# Patient Record
Sex: Female | Born: 1965 | Race: White | Hispanic: No | Smoking: Never smoker
Health system: Southern US, Academic
[De-identification: ages and names within clinical notes are randomized; demographics above are authoritative.]

## PROBLEM LIST (undated history)

## (undated) DIAGNOSIS — S82302A Unspecified fracture of lower end of left tibia, initial encounter for closed fracture: Secondary | ICD-10-CM

## (undated) DIAGNOSIS — N201 Calculus of ureter: Secondary | ICD-10-CM

## (undated) DIAGNOSIS — Z9981 Dependence on supplemental oxygen: Secondary | ICD-10-CM

## (undated) DIAGNOSIS — N200 Calculus of kidney: Secondary | ICD-10-CM

## (undated) DIAGNOSIS — I272 Pulmonary hypertension, unspecified: Secondary | ICD-10-CM

## (undated) DIAGNOSIS — Z8669 Personal history of other diseases of the nervous system and sense organs: Secondary | ICD-10-CM

## (undated) DIAGNOSIS — D6851 Activated protein C resistance: Secondary | ICD-10-CM

## (undated) DIAGNOSIS — I2699 Other pulmonary embolism without acute cor pulmonale: Secondary | ICD-10-CM

## (undated) DIAGNOSIS — F32A Depression, unspecified: Secondary | ICD-10-CM

## (undated) DIAGNOSIS — F329 Major depressive disorder, single episode, unspecified: Secondary | ICD-10-CM

## (undated) DIAGNOSIS — D682 Hereditary deficiency of other clotting factors: Secondary | ICD-10-CM

## (undated) DIAGNOSIS — J9611 Chronic respiratory failure with hypoxia: Secondary | ICD-10-CM

## (undated) HISTORY — PX: ORIF TIBIAL SHAFT FRACTURE W/ PLATES AND SCREWS: SUR964

## (undated) HISTORY — PX: HX TUBAL LIGATION: SHX77

## (undated) MED ORDER — CIPROFLOXACIN 500 MG TAB
500 mg | ORAL_TABLET | Freq: Two times a day (BID) | ORAL | Status: AC
Start: ? — End: 2011-11-29

## (undated) MED ORDER — ONDANSETRON 8 MG TAB, RAPID DISSOLVE
8 mg | ORAL_TABLET | Freq: Three times a day (TID) | ORAL | Status: DC | PRN
Start: ? — End: 2007-06-29

---
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
## 2001-06-06 ENCOUNTER — Emergency Department (HOSPITAL_COMMUNITY): Payer: Self-pay | Admitting: Emergency Medicine

## 2001-07-03 ENCOUNTER — Emergency Department (HOSPITAL_COMMUNITY): Payer: Self-pay | Admitting: Emergency Medicine

## 2001-07-11 ENCOUNTER — Emergency Department (HOSPITAL_COMMUNITY): Payer: Self-pay | Admitting: Emergency Medicine

## 2001-07-11 ENCOUNTER — Other Ambulatory Visit: Payer: Self-pay | Admitting: Emergency Medicine

## 2001-10-13 ENCOUNTER — Other Ambulatory Visit: Payer: Self-pay

## 2001-10-13 ENCOUNTER — Inpatient Hospital Stay (HOSPITAL_COMMUNITY): Payer: Self-pay | Admitting: Internal Medicine

## 2001-11-30 ENCOUNTER — Emergency Department (HOSPITAL_COMMUNITY): Payer: Self-pay

## 2001-12-01 ENCOUNTER — Inpatient Hospital Stay (HOSPITAL_COMMUNITY): Payer: Self-pay | Admitting: Internal Medicine

## 2002-07-24 ENCOUNTER — Inpatient Hospital Stay (HOSPITAL_COMMUNITY): Payer: Self-pay | Admitting: Internal Medicine

## 2004-02-11 HISTORY — PX: VENA CAVA FILTER PLACEMENT: SUR1032

## 2006-06-10 LAB — URINE MICROSCOPIC
Bacteria: NEGATIVE /HPF
Casts: 0 /LPF
Crystals, urine: 0 /LPF
Mucus: 0 /LPF

## 2006-07-29 LAB — URINE MICROSCOPIC
Bacteria: NEGATIVE /HPF
Casts: 0 /LPF
Crystals, urine: 0 /LPF
Mucus: 0 /LPF
RBC: >100 /HPF

## 2006-07-29 LAB — HCG URINE, QL. - POC: Pregnancy test,urine (POC): NEGATIVE

## 2006-07-29 MED ORDER — PROMETHAZINE 25 MG/ML INJECTION
25 mg/mL | Freq: Four times a day (QID) | INTRAMUSCULAR | Status: DC | PRN
Start: 2006-07-29 — End: 2006-07-29

## 2006-07-29 MED FILL — DEMEROL (PF) 50 MG/ML INJECTION SYRINGE: 50 mg/mL | INTRAMUSCULAR | Qty: 1

## 2006-07-29 NOTE — ED Provider Notes (Signed)
Flank Pain   [name] This is a new problem. The current episode started 3 - 5 hours ago. The problem has been unchanged since onset. The problem has been occurring constantly. Patient reports no work related injury.The pain is present in the right side. The quality of the pain is cramping. The pain is at a severity of 7/10. The pain is severe. Associated symptoms include abdominal pain.        Review of Systems   Constitutional: Negative.    Gastrointestinal: Positive for nausea, vomiting and abdominal pain.   Genitourinary: Positive for flank pain.   Musculoskeletal: Positive for back pain.   All other systems reviewed and are negative.      Physical Exam   Constitutional: She appears distressed.   HENT:   Head: Normocephalic.   Eyes: Pupils are equal, round, and reactive to light.   Pulmonary/Chest: Effort normal and breath sounds normal.   Abdominal: Soft.   Neurological: She is alert.   Skin: Skin is warm and dry.       Codeine, Pcn, Prednisone, Ketorolac tromethamine, Shellfish and Iv dye, iodine containing    Results for orders placed during the hospital encounter of 06/10/2006   URINE MICROSCOPIC   Component Value Range   ??? WBC, URINE 0-3  - (/HPF)   ??? RBC, URINE 0-3  - (/HPF)   ??? EPITHELIAL 5-10  - (/HPF)   ??? BACTERIA NEGATIVE   - (/HPF)   ??? CASTS 0  - (/LPF)   ??? CRYSTALS 0  - (/LPF)   ??? MUCUS 0  - (/LPF)         ED Plan:    As ordered.

## 2006-07-29 NOTE — ED Notes (Signed)
CT urogram neg. This ia a frequent ED visitor with chr. pain issues.

## 2006-07-31 LAB — URINE MICROSCOPIC
Casts: 0 /LPF
Crystals, urine: 0 /LPF
Mucus: 0 /LPF

## 2006-07-31 LAB — METABOLIC PANEL, BASIC
Anion gap: 8 (ref 7–16)
BUN: 9 MG/DL (ref 7–18)
CO2: 27 MMOL/L (ref 21–32)
Calcium: 8.6 MG/DL (ref 8.4–10.4)
Chloride: 107 MMOL/L (ref 98–107)
Creatinine: 0.8 MG/DL (ref 0.6–1.0)
GFR est AA: >60 mL/min/{1.73_m2} (ref >60)
GFR est non-AA: >60 mL/min/{1.73_m2} (ref >60)
Glucose: 89 MG/DL (ref 74–106)
Potassium: 4.2 MMOL/L (ref 3.5–5.1)
Sodium: 142 MMOL/L (ref 136–145)

## 2006-07-31 LAB — CBC WITH AUTOMATED DIFF
ABS. BASOPHILS: 0 10*3/uL (ref 0.0–0.2)
ABS. EOSINOPHILS: 0.3 10*3/uL (ref 0.00–0.80)
ABS. LYMPHOCYTES: 1.4 10*3/uL (ref 0.6–4.3)
ABS. MONOCYTES: 0.4 10*3/uL (ref 0.1–0.9)
ABS. NEUTROPHILS: 2.6 10*3/uL (ref 1.9–7.8)
BASOPHILS: 1 % (ref 0.1–1.6)
EOSINOPHILS: 6 % (ref 0.5–7.8)
HCT: 32.3 % — ABNORMAL LOW (ref 35.6–45.0)
HGB: 11 g/dL — ABNORMAL LOW (ref 11.7–15.0)
LYMPHOCYTES: 30 % (ref 14.7–41.3)
MCH: 30 PG (ref 26.1–32.9)
MCHC: 33.9 g/dL (ref 31.4–35.0)
MCV: 88.3 FL (ref 79.6–97.8)
MONOCYTES: 8 % (ref 3.2–9.0)
MPV: 7.5 FL — ABNORMAL LOW (ref 9.3–12.9)
NEUTROPHILS: 55 % (ref 47.0–74.6)
PLATELET: 242 10*3/uL (ref 140–440)
RBC: 3.66 M/uL — ABNORMAL LOW (ref 3.86–5.18)
RDW: 17 % — ABNORMAL HIGH (ref 11.9–14.6)
WBC: 4.8 10*3/uL (ref 4.5–10.5)

## 2006-07-31 LAB — DRUG SCREEN, URINE
ACETAMINOPHEN: POSITIVE
AMPHETAMINES: NEGATIVE
BARBITURATES: POSITIVE
BENZODIAZEPINES: NEGATIVE
COCAINE: NEGATIVE
METHADONE: NEGATIVE
Methamphetamines: NEGATIVE
OPIATES: NEGATIVE
PCP(PHENCYCLIDINE): NEGATIVE
THC (TH-CANNABINOL): NEGATIVE
TRICYCLICS: POSITIVE

## 2006-07-31 LAB — PROTHROMBIN TIME + INR
INR: >6.8 — CR (ref 0.9–1.2)
Prothrombin time: >63 SECS — ABNORMAL HIGH (ref 9.5–12.0)

## 2006-07-31 MED ORDER — PROMETHAZINE 25 MG/ML INJECTION
25 mg/mL | INTRAMUSCULAR | Status: DC
Start: 2006-07-31 — End: 2006-07-31

## 2006-07-31 MED ORDER — CEFTRIAXONE 1 GRAM SOLUTION FOR INJECTION
1 gram | Freq: Once | INTRAMUSCULAR | Status: DC
Start: 2006-07-31 — End: 2006-07-31

## 2006-07-31 MED ORDER — NALBUPHINE 10 MG/ML INJECTION
10 mg/mL | INTRAMUSCULAR | Status: DC
Start: 2006-07-31 — End: 2006-07-31

## 2006-07-31 NOTE — ED Provider Notes (Signed)
Kidney Stone  [name] This is a recurrent problem. The current episode started 2 days ago. The problem has been occurring constantly. The problem has been unchanged since onset. Associated symptoms include abdominal pain. The symptoms are worsened by nothing. The symptoms are relieved by nothing.        Review of Systems   Constitutional: Negative for fever and chills.   Gastrointestinal: Positive for abdominal pain.   Genitourinary: Positive for flank pain.   Musculoskeletal: Positive for back pain.   All other systems reviewed and are negative.      Physical Exam   Constitutional: She is oriented. She appears distressed.   HENT:   Head: Normocephalic and atraumatic.   Right Ear: External ear normal.   Left Ear: External ear normal.   Mouth/Throat: Oropharynx is clear and moist.   Eyes: Conjunctivae are normal. Pupils are equal, round, and reactive to light.   Neck: Normal range of motion. Neck supple.   Cardiovascular: Normal rate, regular rhythm and normal heart sounds.    Abdominal: Bowel sounds are normal. Soft.   Musculoskeletal: Normal range of motion.        Lumbar back: She exhibits tenderness and pain.   Neurological: She is alert and oriented. Gait normal.   Skin: Skin is warm and dry.       Codeine, Pcn, Prednisone, Ketorolac tromethamine, Shellfish and Iv dye, iodine containing    History   Social History   ??? Marital Status: Legally Separated     Spouse Name: N/A     Number of Children: N/A   ??? Years of Education: N/A   Occupational History   ??? Not on file.   Social History Main Topics   ??? Tobacco Use: Never   ??? Alcohol Use: No   ??? Drug Use: No   ??? Sexually Active:    Other Topics Concern   ??? Not on file   Social History Narrative   ??? No narrative on file         ED Plan:    W/u for recurrent stone

## 2006-07-31 NOTE — ED Notes (Signed)
Pt to ct scan per w/c [id number]

## 2006-08-13 MED FILL — DEMEROL (PF) 50 MG/ML INJECTION SYRINGE: 50 mg/mL | INTRAMUSCULAR | Qty: 1

## 2006-08-25 MED ORDER — HYDROCODONE-ACETAMINOPHEN 5 MG-500 MG TAB
5-500 mg | ORAL | Status: DC
Start: 2006-08-25 — End: 2006-08-25

## 2006-08-25 MED ORDER — HYDROCODONE-ACETAMINOPHEN 10 MG-500 MG TAB
10-500 mg | ORAL | Status: AC
Start: 2006-08-25 — End: 2006-08-25
  Administered 2006-08-25: 19:00:00 via ORAL

## 2006-08-25 MED FILL — HYDROCODONE-ACETAMINOPHEN 10 MG-500 MG TAB: 10-500 mg | ORAL | Qty: 1

## 2006-08-25 NOTE — ED Notes (Signed)
Pt discharged ambulatory to home, sitz bath provided. Pt teaching rt discharge and followup instructions. Pt voiced undersgtanding

## 2006-08-25 NOTE — ED Provider Notes (Signed)
Vaginal Pain   Primary symptoms include dyspareunia and genital pain. Primary symptoms include no discharge, no pelvic pain, no genital itching and no dysuria. [name] There has been no fever. This is a new problem. The current episode started more than 2 days ago. The problem has been occurring constantly. The problem has been unchanged since onset. The symptoms occur during intercourse and during urination. She is not pregnant. She has not missed her period. The discharge was normal and grey. Pertinent negatives include no abdominal pain, no nausea and no vomiting. She has tried acetaminophen for the symptoms. The treatment(s) provided no relief. Sexual activity: sexually active. There is a concern regarding sexually transmitted diseases.        Review of Systems   Constitutional: Negative.    Skin:        Painful area around vagina   Gastrointestinal: Negative for nausea, vomiting and abdominal pain.   Genitourinary: Negative for dysuria.       Physical Exam   Constitutional: She is oriented. She appears well-developed and well-nourished.   Cardiovascular: Normal rate and normal heart sounds.    Pulmonary/Chest: Effort normal and breath sounds normal.   Abdominal: Bowel sounds are normal. Soft.   Neurological: She is alert and oriented.   Skin: Skin is warm and dry.       Codeine, Pcn, Prednisone, Ketorolac tromethamine, Shellfish and Iv dye, iodine containing    History   Social History   ??? Marital Status: Legally Separated     Spouse Name: N/A     Number of Children: N/A   ??? Years of Education: N/A   Occupational History   ??? Not on file.   Social History Main Topics   ??? Tobacco Use: Never   ??? Alcohol Use: No   ??? Drug Use: No   ??? Sexually Active: Yes -- Female partner(s)   Other Topics Concern   ??? Not on file   Social History Narrative   ??? No narrative on file

## 2006-08-25 NOTE — ED Notes (Signed)
Pt moved to room 10 for pelvic exam

## 2006-08-25 NOTE — ED Notes (Signed)
UA HCG negative    NP to room for pelvic exam

## 2006-08-25 NOTE — ED Notes (Signed)
Confirmed dosage  For Lortab with T Fasalino NP to be 10 mg po

## 2006-08-25 NOTE — ED Provider Notes (Signed)
Vaginal Pain         ROS    Physical Exam   Genitourinary: Rectum normal and vagina normal. Pelvic exam was performed with patient supine.        Area between vaginal and rectal area with small linear, open area. No drainage but marked tenderness.        Codeine, Pcn, Prednisone, Ketorolac tromethamine, Shellfish and Iv dye, iodine containing    History   Social History   ??? Marital Status: Legally Separated     Spouse Name: N/A     Number of Children: N/A   ??? Years of Education: N/A   Occupational History   ??? Not on file.   Social History Main Topics   ??? Tobacco Use: Never   ??? Alcohol Use: No   ??? Drug Use: No   ??? Sexually Active: Yes -- Female partner(s)   Other Topics Concern   ??? Not on file   Social History Narrative   ??? No narrative on file

## 2006-08-25 NOTE — ED Notes (Signed)
PA to bedsidde

## 2006-09-05 NOTE — ED Notes (Signed)
Pt w/ c/o RUQ sharp abd pain w/ N/V unable to keep anything down.

## 2006-09-05 NOTE — ED Notes (Signed)
Pt to u/s

## 2006-09-06 LAB — URINE MICROSCOPIC
Casts: 0 /LPF
Crystals, urine: 0 /LPF
Mucus: 0 /LPF

## 2006-09-06 LAB — CBC WITH AUTOMATED DIFF
ABS. BASOPHILS: 0 10*3/uL (ref 0.0–0.2)
ABS. EOSINOPHILS: 0.1 10*3/uL (ref 0.00–0.80)
ABS. LYMPHOCYTES: 1.4 10*3/uL (ref 0.6–4.3)
ABS. MONOCYTES: 0.4 10*3/uL (ref 0.1–0.9)
ABS. NEUTROPHILS: 3.5 10*3/uL (ref 1.9–7.8)
BASOPHILS: 1 % (ref 0.1–1.6)
EOSINOPHILS: 2 % (ref 0.5–7.8)
HCT: 31.8 % — ABNORMAL LOW (ref 35.6–45.0)
HGB: 10.8 g/dL — ABNORMAL LOW (ref 11.7–15.0)
LYMPHOCYTES: 26 % (ref 14.7–41.3)
MCH: 31.3 PG (ref 26.1–32.9)
MCHC: 34.1 g/dL (ref 31.4–35.0)
MCV: 91.7 FL (ref 79.6–97.8)
MONOCYTES: 7 % (ref 3.2–9.0)
MPV: 7.4 FL (ref 7.4–10.4)
NEUTROPHILS: 64 % (ref 47.0–74.6)
PLATELET: 291 10*3/uL (ref 140–440)
RBC: 3.47 M/uL — ABNORMAL LOW (ref 3.86–5.18)
RDW: 15.3 % — ABNORMAL HIGH (ref 11.9–14.6)
WBC: 5.3 10*3/uL (ref 4.5–10.5)

## 2006-09-06 LAB — METABOLIC PANEL, COMPREHENSIVE
A-G Ratio: 1.1 — ABNORMAL LOW (ref 1.2–3.5)
ALT (SGPT): 33 U/L (ref 27–62)
AST (SGOT): 17 U/L (ref 15–37)
Albumin: 3.9 GM/DL (ref 3.5–5.0)
Alk. phosphatase: 118 U/L (ref 50–136)
Anion gap: 11 (ref 7–16)
BUN: 9 MG/DL (ref 7–18)
Bilirubin, total: 0.4 MG/DL (ref 0.2–1.1)
CO2: 26 MMOL/L (ref 21–32)
Calcium: 9.1 MG/DL (ref 8.4–10.4)
Chloride: 107 MMOL/L (ref 98–107)
Creatinine: 0.7 MG/DL (ref 0.6–1.0)
GFR est AA: >60 mL/min/{1.73_m2} (ref >60)
GFR est non-AA: >60 mL/min/{1.73_m2} (ref >60)
Globulin: 3.4 GM/DL (ref 2.3–3.5)
Glucose: 87 MG/DL (ref 74–106)
Potassium: 3.5 MMOL/L (ref 3.5–5.1)
Protein, total: 7.3 GM/DL (ref 6.3–8.2)
Sodium: 144 MMOL/L (ref 136–145)

## 2006-09-06 LAB — PROTHROMBIN TIME + INR
INR: 1.1 (ref 0.9–1.2)
Prothrombin time: 11.2 SECS (ref 9.5–12.0)

## 2006-09-06 LAB — HCG URINE, QL: HCG urine, QL: NEGATIVE

## 2006-09-06 LAB — LIPASE: Lipase: 176 U/L (ref 114–286)

## 2006-09-06 LAB — AMYLASE: Amylase: 45 U/L (ref 25–115)

## 2006-09-06 MED ORDER — PROMETHAZINE 25 MG/ML INJECTION
25 mg/mL | INTRAMUSCULAR | Status: AC
Start: 2006-09-06 — End: 2006-09-06
  Administered 2006-09-06: 04:00:00 via INTRAVENOUS

## 2006-09-06 MED ORDER — HYDROMORPHONE 2 MG/ML INJECTION SOLUTION
2 mg/mL | Freq: Once | INTRAMUSCULAR | Status: AC
Start: 2006-09-06 — End: 2006-09-05
  Administered 2006-09-06: 02:00:00 via INTRAVENOUS

## 2006-09-06 MED ORDER — SALINE PERIPHERAL FLUSH Q8H
Freq: Three times a day (TID) | INTRAMUSCULAR | Status: DC
Start: 2006-09-06 — End: 2006-09-06

## 2006-09-06 MED ORDER — SALINE PERIPHERAL FLUSH PRN
INTRAMUSCULAR | Status: DC | PRN
Start: 2006-09-06 — End: 2006-09-06

## 2006-09-06 MED ORDER — PROMETHAZINE 25 MG/ML INJECTION
25 mg/mL | Freq: Once | INTRAMUSCULAR | Status: AC
Start: 2006-09-06 — End: 2006-09-05
  Administered 2006-09-06: 02:00:00 via INTRAVENOUS

## 2006-09-06 MED ORDER — HYDROMORPHONE 2 MG/ML INJECTION SOLUTION
2 mg/mL | INTRAMUSCULAR | Status: AC
Start: 2006-09-06 — End: 2006-09-06
  Administered 2006-09-06: 04:00:00 via INTRAVENOUS

## 2006-09-06 MED ADMIN — ceftriaxone (ROCEPHIN) 1 g IVPB: INTRAVENOUS | @ 04:00:00 | NDC 68330000501

## 2006-09-06 MED ADMIN — sodium chloride 0.9 % bolus infusion 1,000 mL: INTRAVENOUS | @ 02:00:00 | NDC 87701099893

## 2006-09-06 MED FILL — SODIUM CHLORIDE 0.9 % IV PIGGY BACK: INTRAVENOUS | Qty: 50

## 2006-09-06 MED FILL — PROMETHAZINE 25 MG/ML INJECTION: 25 mg/mL | INTRAMUSCULAR | Qty: 1

## 2006-09-06 MED FILL — CEFTRIAXONE 1 GRAM SOLUTION FOR INJECTION: 1 gram | INTRAMUSCULAR | Qty: 1

## 2006-09-06 MED FILL — HYDROMORPHONE 2 MG/ML INJECTION SOLUTION: 2 mg/mL | INTRAMUSCULAR | Qty: 1

## 2006-09-06 NOTE — ED Notes (Signed)
Port int removed

## 2006-09-06 NOTE — ED Notes (Signed)
Abdominal U/S: No acute findings.

## 2006-09-07 NOTE — ED Provider Notes (Signed)
HPI Comments: Pt. States when she was last seen in ER and then referred to the surgeon, she did not go due to not having health insurance. 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  Epigastric Pain   [name] This is a recurrent problem. The current episode started 6 - 12 hours ago. The problem has been occurring constantly. The problem has been gradually worsening since onset. The pain is associated with vomiting and eating. Associated symptoms include anorexia, nausea, vomiting, dysuria, frequency and back pain. Pertinent negatives include no fever, no diarrhea, no constipation, no headaches and no chest pain. The pain is made worse by certain positions, vomiting and deep breathing. The pain is relieved by nothing. Past workup includes ultrasound. The patient's surgical history includes appendectomy.Surgical history: Tubal Ligation, tonsillectomy.        Review of Systems   Constitutional: Positive for chills. Negative for fever.   HENT: Negative for headaches.    Cardiovascular: Negative for chest pain.   Respiratory: Is not experiencing shortness of breath.   Gastrointestinal: Positive for nausea, vomiting and abdominal pain. Negative for heartburn, diarrhea and constipation.   Genitourinary: Positive for dysuria, urgency and frequency.   Musculoskeletal: Positive for back pain.       Physical Exam   Constitutional: She is oriented. Vital signs are normal. She appears well-developed and well-nourished. She has a sickly appearance. She appears distressed.        Pt. Rocking back and forth on exam table crying.   HENT:   Mouth/Throat: Oropharynx is clear and moist.   Eyes: Conjunctivae and extraocular motions are normal. Pupils are equal, round, and reactive to light.   Neck: Normal range of motion. Neck supple.    Cardiovascular: Normal rate, regular rhythm, normal heart sounds and intact distal pulses.  Exam reveals no gallop and no friction rub.    No murmur heard.  Pulmonary/Chest: Effort normal and breath sounds normal. No respiratory distress. She has no wheezes. She has no rales.   Abdominal: Normal appearance. She exhibits no distension, no abdominal bruit, no pulsatile midline mass and no mass. There is no organomegaly. Tenderness is present in the right upper quadrant and epigastric area. She has positive Murphy's sign.   Musculoskeletal: Normal range of motion.   Lymphadenopathy:     She has no cervical adenopathy.   Neurological: She is alert and oriented.   Skin: Skin is warm and dry.   Psychiatric: Her mood appears anxious. She is agitated.       Codeine, Pcn, Prednisone, Ketorolac tromethamine, Shellfish and Iv dye, iodine containing    History   Social History   ??? Marital Status: Legally Separated     Spouse Name: N/A     Number of Children: N/A   ??? Years of Education: N/A   Occupational History   ??? Not on file.   Social History Main Topics   ??? Tobacco Use: Never   ??? Alcohol Use: No   ??? Drug Use: No   ??? Sexually Active: Yes -- Female partner(s)   Other Topics Concern   ??? Not on file   Social History Narrative   ??? No narrative on file

## 2006-09-11 NOTE — ED Notes (Signed)
Pt has no cva tenderness, no fever, no nvd.

## 2006-09-11 NOTE — ED Provider Notes (Addendum)
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   She has had 4 CT urograms this year non of which have shown any signs of obstruction yet pt still has this reccurnt pain.  She has no appendix.  Pts ovarys and uterus are all normal per CTs.  PT denies D/C she has bacteria, blood and WBCs in her urine. She is on her 7th Day of Macrobid and states she has been forcing fluid.  Urinary Pain   [name] This is a new problem. There has been no fever. Associated symptoms include abdominal pain. The patient is not pregnant.       Review of Systems   Gastrointestinal: Positive for abdominal pain.   Genitourinary: Positive for dysuria.   All other systems reviewed and are negative.      Physical Exam   Constitutional: She is oriented. She appears well-developed and well-nourished.   HENT:   Head: Normocephalic and atraumatic.   Eyes: Pupils are equal, round, and reactive to light.   Neck: Normal range of motion.   Cardiovascular: Normal rate.    Pulmonary/Chest: Effort normal.   Abdominal: Bowel sounds are normal. She exhibits no distension and no mass. Soft. Tenderness is present. She has no rebound and no guarding.   Musculoskeletal: Normal range of motion.   Neurological: She is alert and oriented.   Skin: Skin is warm and dry.   Psychiatric: She has a normal mood and affect. Her behavior is normal. Judgment and thought content normal.       Codeine, Pcn, Prednisone, Ketorolac tromethamine, Shellfish and Iv dye, iodine containing    History   Social History   ??? Marital Status: Legally Separated     Spouse Name: N/A     Number of Children: N/A   ??? Years of Education: N/A   Occupational History   ??? Not on file.   Social History Main Topics   ??? Tobacco Use: Never   ??? Alcohol Use: No   ??? Drug Use: No   ??? Sexually Active: Yes -- Female partner(s)   Other Topics Concern   ??? Not on file   Social History Narrative   ??? No narrative on file

## 2006-09-11 NOTE — ED Notes (Signed)
Thinks she has blood in her urine now. Not getting any better

## 2006-09-12 MED ORDER — HYDROCODONE-ACETAMINOPHEN 5 MG-500 MG TAB
5-500 mg | ORAL | Status: AC
Start: 2006-09-12 — End: 2006-09-11
  Administered 2006-09-12: 03:00:00 via ORAL

## 2006-09-12 MED FILL — HYDROCODONE-ACETAMINOPHEN 5 MG-500 MG TAB: 5-500 mg | ORAL | Qty: 2

## 2006-10-08 LAB — HCG URINE, QL. - POC: Pregnancy test,urine (POC): NEGATIVE

## 2006-10-08 LAB — CBC WITH AUTOMATED DIFF
HCT: 30.2 % — ABNORMAL LOW (ref 35.6–45.0)
HGB: 10.3 g/dL — ABNORMAL LOW (ref 11.7–15.0)
M-BANDS: 1 % (ref 0–6)
M-EOSINOPHILS: 11 % — ABNORMAL HIGH (ref 1–8)
M-LYMPHOCYTES: 49 % — ABNORMAL HIGH (ref 16–44)
M-MONOCYTES: 6 % (ref 3–9)
M-NEUTROPHILS: 33 % — ABNORMAL LOW (ref 47–75)
MCH: 31 PG (ref 26.1–32.9)
MCHC: 34 g/dL (ref 31.4–35.0)
MCV: 91.2 FL (ref 79.6–97.8)
MPV: 8.6 FL (ref 7.4–10.4)
PLATELET COMMENTS: ADEQUATE
PLATELET: 211 10*3/uL (ref 140–440)
RBC: 3.32 M/uL — ABNORMAL LOW (ref 3.86–5.18)
RDW: 13.2 % (ref 11.9–14.6)
WBC: 3.3 10*3/uL — ABNORMAL LOW (ref 4.5–10.5)

## 2006-10-08 LAB — METABOLIC PANEL, BASIC
Anion gap: 8 (ref 7–16)
BUN: 16 MG/DL (ref 7–18)
CO2: 29 MMOL/L (ref 21–32)
Calcium: 9.1 MG/DL (ref 8.4–10.4)
Chloride: 101 MMOL/L (ref 98–107)
Creatinine: 1 MG/DL (ref 0.6–1.0)
GFR est AA: >60 mL/min/{1.73_m2} (ref >60)
GFR est non-AA: >60 mL/min/{1.73_m2} (ref >60)
Glucose: 92 MG/DL (ref 74–106)
Potassium: 4.4 MMOL/L (ref 3.5–5.1)
Sodium: 138 MMOL/L (ref 136–145)

## 2006-10-08 LAB — URINE MICROSCOPIC
Casts: 0 /LPF
Crystals, urine: 0 /LPF
Mucus: 0 /LPF
RBC: >100 /HPF

## 2006-10-08 LAB — PROTHROMBIN TIME + INR
INR: 1.4 — ABNORMAL HIGH (ref 0.9–1.2)
Prothrombin time: 14.4 SECS — ABNORMAL HIGH (ref 9.5–12.0)

## 2006-10-08 MED ORDER — SODIUM CHLORIDE 0.9 % IV
INTRAVENOUS | Status: DC
Start: 2006-10-08 — End: 2006-10-08
  Administered 2006-10-08: 15:00:00 via INTRAVENOUS

## 2006-10-08 MED ORDER — HYDROMORPHONE 2 MG/ML INJECTION SOLUTION
2 mg/mL | Freq: Once | INTRAMUSCULAR | Status: AC
Start: 2006-10-08 — End: 2006-10-08
  Administered 2006-10-08: 13:00:00 via INTRAVENOUS

## 2006-10-08 MED ORDER — ONDANSETRON HCL 2 MG/ML IV
2 mg/mL | Freq: Once | INTRAVENOUS | Status: AC
Start: 2006-10-08 — End: 2006-10-08
  Administered 2006-10-08: 13:00:00 via INTRAVENOUS

## 2006-10-08 MED ORDER — HYDROMORPHONE 2 MG/ML INJECTION SOLUTION
2 mg/mL | Freq: Once | INTRAMUSCULAR | Status: AC
Start: 2006-10-08 — End: 2006-10-08
  Administered 2006-10-08: 15:00:00 via INTRAVENOUS

## 2006-10-08 MED ORDER — SODIUM CHLORIDE 0.9% BOLUS IV
0.9 % | Freq: Once | INTRAVENOUS | Status: AC
Start: 2006-10-08 — End: 2006-10-08
  Administered 2006-10-08: 14:00:00 via INTRAVENOUS

## 2006-10-08 MED ORDER — HYDROMORPHONE 2 MG/ML INJECTION SOLUTION
2 mg/mL | Freq: Once | INTRAMUSCULAR | Status: AC
Start: 2006-10-08 — End: 2006-10-08
  Administered 2006-10-08: 14:00:00 via INTRAVENOUS

## 2006-10-08 MED ORDER — DIPHENHYDRAMINE HCL 50 MG/ML IJ SOLN
50 mg/mL | INTRAMUSCULAR | Status: AC
Start: 2006-10-08 — End: 2006-10-08

## 2006-10-08 MED ORDER — HYDROMORPHONE 2 MG/ML INJECTION SOLUTION
2 mg/mL | Freq: Once | INTRAMUSCULAR | Status: DC
Start: 2006-10-08 — End: 2006-10-08

## 2006-10-08 MED ORDER — DIPHENHYDRAMINE HCL 50 MG/ML IJ SOLN
50 mg/mL | INTRAMUSCULAR | Status: AC
Start: 2006-10-08 — End: 2006-10-08
  Administered 2006-10-08 (×2): via INTRAVENOUS

## 2006-10-08 MED ORDER — HEPARIN LOCK FLUSH 100 UNIT/ML IV SOLN
100 unit/mL | INTRAVENOUS | Status: AC
Start: 2006-10-08 — End: 2006-10-08

## 2006-10-08 MED ORDER — DIPHENHYDRAMINE 25 MG CAP
25 mg | ORAL | Status: DC
Start: 2006-10-08 — End: 2006-10-08

## 2006-10-08 MED ADMIN — diphenhydrAMINE (BENADRYL) 50 mg/mL injection: INTRAVENOUS | @ 16:00:00 | NDC 57866728801

## 2006-10-08 MED ADMIN — heparin (porcine) 100 unit/mL injection: @ 17:00:00 | NDC 00009029101

## 2006-10-08 MED FILL — HYDROMORPHONE 2 MG/ML INJECTION SOLUTION: 2 mg/mL | INTRAMUSCULAR | Qty: 1

## 2006-10-08 MED FILL — HEPARIN LOCK 100 UNIT/ML INTRAVENOUS SOLUTION: 100 unit/mL | INTRAVENOUS | Qty: 10

## 2006-10-08 MED FILL — DIPHENHYDRAMINE 25 MG CAP: 25 mg | ORAL | Qty: 1

## 2006-10-08 MED FILL — ONDANSETRON HCL 2 MG/ML IV: 2 mg/mL | INTRAVENOUS | Qty: 2

## 2006-10-08 MED FILL — DIPHENHYDRAMINE HCL 50 MG/ML IJ SOLN: 50 mg/mL | INTRAMUSCULAR | Qty: 1

## 2006-10-08 NOTE — ED Notes (Signed)
Improved, no longer vomiting

## 2006-10-08 NOTE — ED Notes (Signed)
To U/S via wheelchair.

## 2006-10-08 NOTE — ED Notes (Signed)
Ct abd/pelvis non obstructing renal calculi, no ureteral calculi, ? 2.5 cm left adenexal mass

## 2006-10-08 NOTE — ED Notes (Signed)
Pt to CT via wheelchair.

## 2006-10-08 NOTE — ED Notes (Signed)
Pt resting quietly. Warm balnkets given.

## 2006-10-08 NOTE — ED Notes (Signed)
Pelvic US reveals left ovarian cyst 2.4 cm

## 2006-10-08 NOTE — ED Provider Notes (Signed)
Kidney Stone  [name] This is a recurrent problem. The current episode started 3 - 5 hours ago. The problem has been occurring constantly. The problem has been gradually worsening since onset. Associated symptoms include abdominal pain. The symptoms are worsened by nothing. The symptoms are relieved by nothing. She has tried nothing for the symptoms. The treatment(s) provided no relief.        Review of Systems   Constitutional: Positive for weakness. Negative for fever, chills, weight loss, malaise/fatigue and diaphoresis.   Skin: Negative.    HENT: Negative.    Eyes: Negative.    Cardiovascular: Negative.    Respiratory: Negative.    Gastrointestinal: Positive for nausea, vomiting and abdominal pain. Negative for diarrhea and constipation.   Genitourinary: Positive for dysuria, hematuria and flank pain.   Musculoskeletal: Negative.    Neurological: Negative.    Psychiatric: Is nervous/anxious.        Physical Exam   Nursing note and vitals reviewed.  Constitutional: She is oriented. She appears well-developed and well-nourished. She appears not diaphoretic. She appears distressed.   HENT:   Head: Normocephalic and atraumatic.   Nose: Nose normal.   Mouth/Throat: Oropharynx is clear and moist.   Eyes: Conjunctivae and extraocular motions are normal. Pupils are equal, round, and reactive to light.   Neck: Normal range of motion. Neck supple.   Cardiovascular: Normal rate and regular rhythm.    Pulmonary/Chest: Effort normal and breath sounds normal.   Abdominal: She exhibits no distension and no mass. Tenderness is present. She has no rebound and no guarding.   Musculoskeletal: Normal range of motion.   Neurological: She is alert and oriented.   Skin: Skin is warm and dry. She is not diaphoretic.   Psychiatric: Her behavior is normal. Judgment and thought content normal.       Codeine, Pcn, Prednisone, Ketorolac tromethamine, Shellfish and Iv dye, iodine containing    History    Social History   ??? Marital Status: Legally Separated     Spouse Name: N/A     Number of Children: N/A   ??? Years of Education: N/A   Occupational History   ??? Not on file.   Social History Main Topics   ??? Tobacco Use: Never   ??? Alcohol Use: No   ??? Drug Use: No   ??? Sexually Active: Yes -- Female partner(s)     Birth Control/ Protection: Surgical   Other Topics Concern   ??? Not on file   Social History Narrative   ??? No narrative on file       Patient states recent admission to [facility] for blood clots and kidney stones. Stent placed but painful so she requested it be removed. Left lower quad tenderness.

## 2006-10-08 NOTE — ED Notes (Signed)
Pt dx with a 4.5 mm kidney store with stent placement and ?lithptripsy. Had stent removed a week ago "because it was too painful". Now possibly passing stone fragments.

## 2006-10-09 NOTE — ED Notes (Signed)
Pt sleeping.

## 2006-10-09 NOTE — ED Provider Notes (Signed)
HPI Comments: 81 wf seen yesterday for abdominal pain had CT abd and pelvil, only cyst seen .  US revealed large L cyst. Now with pain and nausea not controlled with oral meds.   Patient has had multiple ED visits this year.  Abdominal Pain   [name] This is a recurrent problem. The current episode started yesterday. The problem has been occurring constantly. The problem has been gradually worsening since onset. The pain is associated with vomiting. The pain is at a severity of 10/10. The pain is severe. Associated symptoms include nausea and vomiting. Pertinent negatives include no fever. The pain is made worse by activity. The pain is relieved by nothing (no oral meds help). Past workup includes CT scan and ultrasound. Procedure History: showed large ovarian cyst according to the patient. Surgical history: PE, Factor V, kidney stones blood colts.        Review of Systems   Constitutional: Negative.  Negative for fever.   Skin: Negative.    HENT: Negative.    Eyes: Negative.    Cardiovascular: Negative.    Gastrointestinal: Positive for nausea, vomiting and abdominal pain.   Genitourinary: Negative.    Musculoskeletal: Negative.    Neurological: Negative.    Psychiatric: Negative.        Physical Exam   Constitutional: She appears well-developed. She appears distressed.   HENT:   Head: Normocephalic.   Right Ear: External ear normal.   Pulmonary/Chest: Effort normal and breath sounds normal.   Abdominal: She exhibits no mass. Tenderness is present. She has guarding. She has no rebound.       Codeine, Pcn, Prednisone, Ketorolac tromethamine, Shellfish and Iv dye, iodine containing    History   Social History   ??? Marital Status: Legally Separated     Spouse Name: N/A     Number of Children: N/A   ??? Years of Education: N/A   Occupational History   ??? Not on file.   Social History Main Topics   ??? Tobacco Use: Never   ??? Alcohol Use: No   ??? Drug Use: No    ??? Sexually Active: Yes -- Female partner(s)     Birth Control/ Protection: Surgical   Other Topics Concern   ??? Not on file   Social History Narrative   ??? No narrative on file

## 2006-10-09 NOTE — ED Notes (Signed)
Seen here yesterday for same, had u/s diagnosed with cyst, states needs help with pain

## 2006-10-10 MED ORDER — ONDANSETRON HCL 2 MG/ML IV
2 mg/mL | INTRAVENOUS | Status: AC
Start: 2006-10-10 — End: 2006-10-09
  Administered 2006-10-10: 03:00:00 via INTRAVENOUS

## 2006-10-10 MED ORDER — HYDROMORPHONE 2 MG/ML INJECTION SOLUTION
2 mg/mL | INTRAMUSCULAR | Status: AC
Start: 2006-10-10 — End: 2006-10-09
  Administered 2006-10-10: 03:00:00 via INTRAVENOUS

## 2006-10-10 MED FILL — ONDANSETRON HCL 2 MG/ML IV: 2 mg/mL | INTRAVENOUS | Qty: 2

## 2006-10-10 MED FILL — HYDROMORPHONE 2 MG/ML INJECTION SOLUTION: 2 mg/mL | INTRAMUSCULAR | Qty: 1

## 2006-10-22 LAB — METABOLIC PANEL, BASIC
Anion gap: 6 — ABNORMAL LOW (ref 7–16)
BUN: 14 MG/DL (ref 7–18)
CO2: 26 MMOL/L (ref 21–32)
Calcium: 9 MG/DL (ref 8.4–10.4)
Chloride: 104 MMOL/L (ref 98–107)
Creatinine: 0.6 MG/DL (ref 0.6–1.0)
GFR est AA: >60 mL/min/{1.73_m2} (ref >60)
GFR est non-AA: >60 mL/min/{1.73_m2} (ref >60)
Glucose: 92 MG/DL (ref 74–106)
Potassium: 3.6 MMOL/L (ref 3.5–5.1)
Sodium: 136 MMOL/L (ref 136–145)

## 2006-10-22 LAB — CBC WITH AUTOMATED DIFF
ABS. BASOPHILS: 0 10*3/uL (ref 0.0–0.2)
ABS. EOSINOPHILS: 0.2 10*3/uL (ref 0.00–0.80)
ABS. LYMPHOCYTES: 1.2 10*3/uL (ref 0.6–4.3)
ABS. MONOCYTES: 0.3 10*3/uL (ref 0.1–0.9)
ABS. NEUTROPHILS: 2 10*3/uL (ref 1.9–7.8)
ABSOLUTE LUC: 0.1 (ref 0.0–0.3)
BASOPHILS: 1 % (ref 0.1–1.6)
EOSINOPHILS: 6 % (ref 0.5–7.8)
HCT: 32.8 % — ABNORMAL LOW (ref 35.6–45.0)
HGB: 11.2 g/dL — ABNORMAL LOW (ref 11.7–15.0)
LGE UNSTAINED CELLS: 2 % (ref 0.9–3.1)
LYMPHOCYTES: 32 % (ref 14.7–41.3)
MCH: 30.3 PG (ref 26.1–32.9)
MCHC: 34 g/dL (ref 31.4–35.0)
MCV: 89.1 FL (ref 79.6–97.8)
MONOCYTES: 7 % (ref 3.2–9.0)
MPV: 8.2 FL — ABNORMAL LOW (ref 9.3–12.9)
NEUTROPHILS: 52 % (ref 47.0–74.6)
PLATELET: 349 10*3/uL (ref 140–440)
RBC: 3.68 M/uL — ABNORMAL LOW (ref 3.86–5.18)
RDW: 13.2 % (ref 11.9–14.6)
WBC: 3.7 10*3/uL — ABNORMAL LOW (ref 4.5–10.5)

## 2006-10-22 LAB — PROTHROMBIN TIME + INR
INR: 1.8 — ABNORMAL HIGH (ref 0.9–1.2)
Prothrombin time: 17.3 SECS — ABNORMAL HIGH (ref 9.5–11.1)

## 2006-10-22 NOTE — ED Notes (Signed)
INR on 9/4 was 2.1 - adjustment to 10, 10, 10 then back to 5mg  coumadin.

## 2006-10-22 NOTE — ED Provider Notes (Signed)
Chest Pain   [name] This is a new problem. The current episode started yesterday. The problem has been unchanged since onset. The average episode lasts 24 hours. The problem has been occurring constantly. The pain is associated with breathing. The pain is present in the left side. The pain is at a severity of 8/10. The pain is moderate. The quality of the pain is sharp and stabbing. The pain does not radiate. The symptoms are worsened by movement and deep breathing. Pertinent negatives include no diaphoresis, no irregular heartbeat, no nausea, no vomiting and no dizziness. Treatments Tried: Currently on coumadin. Risk factors: Diagnosed with pulmonary emboli. Her past medical history is significant for PE.       Past Medical History   Diagnosis Date   ??? Pulmonary Embolism    ??? Factor V (Leiden) Deficiency    ??? Kidney Calculi    ??? Thromboembolus    ??? Other Ill-Defined Conditions      port          Past Surgical History   Procedure Date   ??? Hx appendectomy    ??? Hx gyn    ??? Hx tubal ligation    ??? Hx urological    ??? Hx heent    ??? Hx tonsillectomy    ??? Hx other surgical            No family history on file.     History   Social History   ??? Marital Status: Legally Separated     Spouse Name: N/A     Number of Children: N/A   ??? Years of Education: N/A   Occupational History   ??? Not on file.   Social History Main Topics   ??? Tobacco Use: Never   ??? Alcohol Use: No   ??? Drug Use: No   ??? Sexually Active: Yes -- Female partner(s)     Birth Control/ Protection: Surgical   Other Topics Concern   ??? Not on file   Social History Narrative   ??? No narrative on file           ALLERGIES: Codeine, Pcn, Prednisone, Ketorolac tromethamine, Shellfish and Iv dye, iodine containing      Review of Systems   Constitutional: Negative for diaphoresis.   Cardiovascular: Positive for chest pain.   Respiratory: Negative.    Gastrointestinal: Negative.  Negative for nausea and vomiting.   Genitourinary: Negative.     Neurological: Negative for dizziness.   All other systems reviewed and are negative.      Filed Vitals:    10/22/2006  5:25 PM   BP: 101/77   Pulse: 91   Temp: 98.9 ??F (37.2 ??C)   Resp: 16   Height: 5\' 4"  (1.626 m)   Weight: 138 lb (62.596 kg)   SpO2: 99%              Physical Exam   Constitutional: She is oriented. She appears well-developed and well-nourished.   HENT:   Head: Normocephalic and atraumatic.   Right Ear: External ear normal.   Left Ear: External ear normal.   Cardiovascular: Normal rate, regular rhythm and normal heart sounds.    Pulmonary/Chest: Effort normal and breath sounds normal.   Abdominal: Bowel sounds are normal. Soft.   Neurological: She is alert and oriented.   Skin: Skin is warm and dry.   Psychiatric: She has a normal mood and affect. Her behavior is normal.

## 2006-10-22 NOTE — ED Notes (Signed)
Report to [facility], [facility] fund[facility].  Pt in X-Ray.

## 2006-10-22 NOTE — ED Notes (Signed)
D-dimer below one. Discussed results with patient and plans to increase Coumadin for 3 days up to 10 mg. To follow up with family MD on Monday for recheck.

## 2006-10-23 LAB — D-DIMER, QUANTITATIVE: D-Dimer, Quant: 0.71 ug/ml(FEU) (ref <1.1)

## 2006-10-23 LAB — D DIMER: D DIMER: 0.71 ug/ml(FEU) (ref <1.1)

## 2006-10-23 MED ADMIN — heparin (porcine) 100 unit/mL injection: @ 01:00:00 | NDC 00009029101

## 2006-10-23 MED FILL — HEPARIN LOCK 100 UNIT/ML INTRAVENOUS SOLUTION: 100 unit/mL | INTRAVENOUS | Qty: 10

## 2006-11-05 LAB — CBC WITH AUTOMATED DIFF
HCT: 33.8 % — ABNORMAL LOW (ref 35.6–45.0)
HGB: 11.5 g/dL — ABNORMAL LOW (ref 11.7–15.0)
M-EOSINOPHILS: 9 % — ABNORMAL HIGH (ref 1–8)
M-LYMPHOCYTES: 36 % (ref 16–44)
M-MONOCYTES: 5 % (ref 3–9)
M-NEUTROPHILS: 50 % (ref 47–75)
MCH: 30.9 PG (ref 26.1–32.9)
MCHC: 34.1 g/dL (ref 31.4–35.0)
MCV: 90.5 FL (ref 79.6–97.8)
MPV: 7.7 FL (ref 7.4–10.4)
PLATELET COMMENTS: ADEQUATE
PLATELET: 343 10*3/uL (ref 140–440)
RBC: 3.74 M/uL — ABNORMAL LOW (ref 3.86–5.18)
RDW: 13.5 % (ref 11.9–14.6)
WBC: 3.5 10*3/uL — ABNORMAL LOW (ref 4.5–10.5)

## 2006-11-05 MED ORDER — PROMETHAZINE 25 MG/ML INJECTION
25 mg/mL | INTRAMUSCULAR | Status: AC
Start: 2006-11-05 — End: 2006-11-05
  Administered 2006-11-05: 23:00:00 via INTRAMUSCULAR

## 2006-11-05 MED ORDER — MEPERIDINE (PF) 50 MG/ML INJ SOLN
50 mg/ml | INTRAMUSCULAR | Status: DC
Start: 2006-11-05 — End: 2006-11-05

## 2006-11-05 MED ORDER — MEPERIDINE (PF) 50 MG/ML INJ SOLN
50 mg/ml | INTRAMUSCULAR | Status: AC
Start: 2006-11-05 — End: 2006-11-05
  Administered 2006-11-05: 23:00:00 via INTRAMUSCULAR

## 2006-11-05 MED ORDER — PROMETHAZINE 25 MG/ML INJECTION
25 mg/mL | INTRAMUSCULAR | Status: DC
Start: 2006-11-05 — End: 2006-11-05

## 2006-11-05 MED FILL — PROMETHAZINE 25 MG/ML INJECTION: 25 mg/mL | INTRAMUSCULAR | Qty: 1

## 2006-11-05 MED FILL — DEMEROL (PF) 50 MG/ML INJECTION SYRINGE: 50 mg/mL | INTRAMUSCULAR | Qty: 1

## 2006-11-05 NOTE — ED Notes (Signed)
Pt. Verbalized understanding of dc. Inst.

## 2006-11-05 NOTE — ED Provider Notes (Signed)
Abdominal Pain   [name] This is a new problem. The current episode started yesterday. The problem has been occurring constantly. The problem has been unchanged since onset. The pain is moderate. Associated symptoms include nausea. Pertinent negatives include no anorexia, no fever, no belching, no flatus, no melena, no vomiting and no constipation. The pain is relieved by nothing.        Past Medical History   Diagnosis Date   ??? Pulmonary Embolism    ??? Factor V (Leiden) Deficiency    ??? Kidney Calculi    ??? Thromboembolus    ??? Other Ill-Defined Conditions      port          Past Surgical History   Procedure Date   ??? Hx appendectomy    ??? Hx gyn    ??? Hx tubal ligation    ??? Hx urological    ??? Hx heent    ??? Hx tonsillectomy    ??? Hx other surgical            No family history on file.     History   Social History   ??? Marital Status: Legally Separated     Spouse Name: N/A     Number of Children: N/A   ??? Years of Education: N/A   Occupational History   ??? Not on file.   Social History Main Topics   ??? Tobacco Use: Never   ??? Alcohol Use: No   ??? Drug Use: No   ??? Sexually Active: Yes -- Female partner(s)     Birth Control/ Protection: Surgical   Other Topics Concern   ??? Not on file   Social History Narrative   ??? No narrative on file           ALLERGIES: Codeine, Pcn, Prednisone, Ketorolac tromethamine, Shellfish and Iv dye, iodine containing      Review of Systems   Constitutional: Negative for fever, chills, weight loss, diaphoresis and weakness.   Skin: Negative for rash and itching.   Gastrointestinal: Positive for nausea and abdominal pain. Negative for vomiting, constipation and melena.       Filed Vitals:    11/05/2006  5:42 PM   BP: 177/76   Pulse: 82   Temp: 97.7 ??F (36.5 ??C)   Resp: 19   Height: 5\' 4"  (1.626 m)   Weight: 136 lb (61.689 kg)              Physical Exam   Constitutional: She is oriented. She appears well-developed and well-nourished.   HENT:    Head: Normocephalic and atraumatic.   Eyes: Extraocular motions are normal. Pupils are equal, round, and reactive to light.   Neck: Normal range of motion. Neck supple.   Cardiovascular: Normal rate and regular rhythm.    Pulmonary/Chest: Effort normal and breath sounds normal.   Abdominal: Bowel sounds are normal. She exhibits no distension and no mass. Soft. Tenderness is present. She has no rebound and no guarding.   Neurological: She is alert and oriented.

## 2006-11-05 NOTE — ED Notes (Signed)
Pt c/o abdominal pain sent to ultrasound  Unable to make determination of ultrasound because pt had eaten Dr. Rushie Goltz aware

## 2006-11-11 NOTE — ED Notes (Signed)
Signed chart only but did not see this patient.

## 2006-11-12 NOTE — ED Provider Notes (Signed)
HPI Comments: Patient is well known to the ED. 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 Goal level 3.0-3.5. She states she has been taking her Coumadin but is also on Lovenox injections now as well. Her last PE was in August when she was treated at [facility]. She began have sharp chest pain that radiates to her back on the left side today. She is also short of breath. She states this pain is "similar to her other PE episodes." C/o some nausea but no vomiting.   Chest Pain (Angina)   Associated symptoms include malaise/fatigue, nausea, weakness and shortness of breath. Pertinent negatives include no vomiting, no cough, no hemoptysis and no sputum production.        Past Medical History   Diagnosis Date   ??? Pulmonary Embolism    ??? Factor V (Leiden) Deficiency    ??? Kidney Calculi    ??? Thromboembolus    ??? Other Ill-Defined Conditions      port          Past Surgical History   Procedure Date   ??? Hx appendectomy    ??? Hx gyn    ??? Hx tubal ligation    ??? Hx urological    ??? Hx heent    ??? Hx tonsillectomy    ??? Hx other surgical            No family history on file.     History   Social History   ??? Marital Status: Legally Separated     Spouse Name: N/A     Number of Children: N/A   ??? Years of Education: N/A   Occupational History   ??? Not on file.   Social History Main Topics   ??? Tobacco Use: Never   ??? Alcohol Use: No   ??? Drug Use: No   ??? Sexually Active: Yes -- Female partner(s)     Birth Control/ Protection: Surgical   Other Topics Concern   ??? Not on file   Social History Narrative   ??? No narrative on file           ALLERGIES: Codeine, Pcn, Prednisone, Ketorolac tromethamine, Shellfish and Iv dye, iodine containing      Review of Systems   Constitutional: Positive for malaise/fatigue and weakness.   Skin: Negative.    HENT: Negative.    Eyes: Negative.    Cardiovascular: Positive for chest pain.    Respiratory: Negative for cough, hemoptysis and sputum production.  Is experiencing shortness of breath. Is not experiencing wheezing.   Gastrointestinal: Positive for nausea. Negative for vomiting, diarrhea and constipation.   Genitourinary: Negative.    Musculoskeletal: Negative.    Endo/Heme/Allergies: Negative.    Neurological: Negative.        Filed Vitals:    11/12/2006  8:17 PM   BP: 110/69   Pulse: 96   Temp: 98.6 ??F (37 ??C)   Resp: 23   Height: 5\' 4"  (1.626 m)   Weight: 138 lb (62.596 kg)   SpO2: 100%              Physical Exam   Constitutional: She is oriented. She appears well-developed and well-nourished. She appears not diaphoretic. She appears distressed (anxious, crying).   HENT:   Head: Normocephalic and atraumatic.   Eyes: Conjunctivae and extraocular motions are normal. Pupils are equal, round, and reactive to light.   Neck: Normal range of motion. Neck  supple.   Cardiovascular: Regular rhythm and intact distal pulses.         Slight tachycardia     Pulmonary/Chest: Breath sounds normal. She is in respiratory distress (mild tachypnea).   Abdominal: Soft. No tenderness.   Musculoskeletal: Normal range of motion. She exhibits no edema.   Neurological: She is alert and oriented.   Skin: Skin is warm and dry. She is not diaphoretic.

## 2006-11-12 NOTE — ED Notes (Signed)
Pain started 150 min ago substernally that radiates to back- states she has had 15 blood clots in her lungs and this pain is the same

## 2006-11-13 ENCOUNTER — Inpatient Hospital Stay
Admission: EM | Admit: 2006-11-13 | Discharge: 2006-11-15 | Disposition: A | Discharge status: Home / Self Care | Source: Emergency Department | Attending: Internal Medicine | Admitting: Internal Medicine

## 2006-11-13 DIAGNOSIS — R079 Chest pain, unspecified: Secondary | ICD-10-CM

## 2006-11-13 LAB — METABOLIC PANEL, COMPREHENSIVE
A-G Ratio: 1 — ABNORMAL LOW (ref 1.2–3.5)
ALT (SGPT): 30 U/L (ref 30–65)
AST (SGOT): 14 U/L — ABNORMAL LOW (ref 15–37)
Albumin: 4 GM/DL (ref 3.5–5.0)
Alk. phosphatase: 154 U/L — ABNORMAL HIGH (ref 50–136)
Anion gap: 8 (ref 7–16)
BUN: 15 MG/DL (ref 7–18)
Bilirubin, total: 0.2 MG/DL (ref 0.2–1.1)
CO2: 27 MMOL/L (ref 21–32)
Calcium: 9.2 MG/DL (ref 8.4–10.4)
Chloride: 103 MMOL/L (ref 98–107)
Creatinine: 1 MG/DL (ref 0.6–1.0)
GFR est AA: >60 mL/min/{1.73_m2} (ref >60)
GFR est non-AA: >60 mL/min/{1.73_m2} (ref >60)
Globulin: 3.9 GM/DL — ABNORMAL HIGH (ref 2.3–3.5)
Glucose: 102 MG/DL (ref 74–106)
Potassium: 4 MMOL/L (ref 3.5–5.1)
Protein, total: 7.9 GM/DL (ref 6.3–8.2)
Sodium: 138 MMOL/L (ref 136–145)

## 2006-11-13 LAB — CBC WITH AUTOMATED DIFF
ABS. IMM. GRANS.: 0 10*3/uL (ref 0.0–2.0)
ABS. LYMPHOCYTES: 1.8 10*3/uL (ref 0.6–4.3)
ABS. MONOCYTES: 0.4 10*3/uL (ref 0.1–0.9)
ABS. NEUTROPHILS: 3 10*3/uL (ref 1.9–7.8)
BASOPHILS: 0 % — ABNORMAL LOW (ref 0.1–1.6)
EOSINOPHILS: 4 % (ref 0.5–7.8)
HCT: 33.2 % — ABNORMAL LOW (ref 35.6–45.0)
HGB: 10.7 g/dL — ABNORMAL LOW (ref 11.7–15.0)
LYMPHOCYTES: 34 % (ref 14.7–41.3)
MCH: 29.6 PG (ref 26.1–32.9)
MCHC: 32.2 g/dL (ref 31.4–35.0)
MCV: 91.7 FL (ref 79.6–97.8)
MONOCYTES: 8 % (ref 3.2–9.0)
MPV: 10.6 FL (ref 9.3–12.9)
NEUTROPHILS: 54 % (ref 47.0–74.6)
PLATELET: 352 10*3/uL (ref 140–440)
RBC: 3.62 M/uL — ABNORMAL LOW (ref 3.86–5.18)
RDW: 12.9 % (ref 11.9–14.6)
WBC: 5.5 10*3/uL (ref 4.5–10.5)

## 2006-11-13 LAB — BLOOD GAS & LYTES, ARTERIAL
ALLENS TEST: POSITIVE
ANION GAP: 15
Arterial O2 Hgb: 98.4 % — ABNORMAL HIGH (ref 94.0–97.0)
BASE EXCESS: 0.3 mmol/L (ref 0–3)
BICARBONATE: 22 mmol/L (ref 22.0–26.0)
CARBOXYHEMOGLOBIN: 0.7 % (ref 0.5–1.5)
CHLORIDE: 105 (ref 98–106)
Calcium, ionized: 1.19 MMOL/L (ref 1.0–1.3)
DEOXYHEMOGLOBIN: 1 % (ref 0.0–5.0)
HEMATOCRIT: 34 % — ABNORMAL LOW (ref 37–50)
METHEMOGLOBIN: 0.1 % (ref 0.0–1.5)
O2 FLOW: 2.5
O2 SAT: 99 % — ABNORMAL HIGH (ref 92.0–98.5)
PCO2: 27 mmHg — ABNORMAL LOW (ref 35.0–45.0)
PO2: 137 mmHg — CR (ref 75.0–100.0)
POTASSIUM: 4.08 MMOL/L (ref 3.5–5.3)
Sodium: 137.6 MMOL/L (ref 135–148)
TOTAL HEMOGLOBIN: 11.6 GM/DL — ABNORMAL LOW (ref 12.0–18.0)
pH: 7.53 — ABNORMAL HIGH (ref 7.35–7.45)

## 2006-11-13 LAB — PTT
aPTT: 60.3 s — ABNORMAL HIGH (ref 23.5–31.7)
aPTT: 44.1 s — ABNORMAL HIGH (ref 23.5–31.7)

## 2006-11-13 LAB — TROPONIN I
Troponin-I, Qt.: <0.04 NG/ML — ABNORMAL LOW (ref 0.04–0.05)
Troponin-I, Qt.: 0.04 NG/ML (ref 0.04–0.05)

## 2006-11-13 LAB — URINE MICROSCOPIC
Casts: 0 /LPF
Mucus: 0 /LPF
RBC: 0 /HPF

## 2006-11-13 LAB — PROTHROMBIN TIME + INR
INR: 1.2 (ref 0.9–1.2)
INR: 1.3 — ABNORMAL HIGH (ref 0.9–1.2)
Prothrombin time: 12.2 SECS — ABNORMAL HIGH (ref 9.5–11.1)
Prothrombin time: 13 SECS — ABNORMAL HIGH (ref 9.5–11.1)

## 2006-11-13 LAB — CK
CK: 28 U/L (ref 21–215)
CK: 16 U/L — ABNORMAL LOW (ref 21–215)

## 2006-11-13 MED ORDER — LANSOPRAZOLE 30 MG RAPID DISSOLVE TAB, DELAYED RELEASE
30 mg | Freq: Every day | ORAL | Status: DC
Start: 2006-11-13 — End: 2006-11-15
  Administered 2006-11-13 – 2006-11-15 (×3): via ORAL

## 2006-11-13 MED ORDER — CAMPHOR-MENTHOL 0.5 %-0.5 % LOTION
CUTANEOUS | Status: DC | PRN
Start: 2006-11-13 — End: 2006-11-15

## 2006-11-13 MED ORDER — HYDROMORPHONE 2 MG/ML INJECTION SOLUTION
2 mg/mL | Freq: Once | INTRAMUSCULAR | Status: AC
Start: 2006-11-13 — End: 2006-11-12
  Administered 2006-11-13: 03:00:00 via INTRAVENOUS

## 2006-11-13 MED ORDER — DIPHENHYDRAMINE 50 MG CAP
50 mg | ORAL | Status: DC | PRN
Start: 2006-11-13 — End: 2006-11-15
  Administered 2006-11-13 – 2006-11-15 (×11): via ORAL

## 2006-11-13 MED ORDER — DIPHENHYDRAMINE HCL 50 MG/ML IJ SOLN
50 mg/mL | INTRAMUSCULAR | Status: AC
Start: 2006-11-13 — End: 2006-11-12
  Administered 2006-11-13: 03:00:00 via INTRAVENOUS

## 2006-11-13 MED ORDER — SODIUM CHLORIDE 0.9 % IV
INTRAVENOUS | Status: DC
Start: 2006-11-13 — End: 2006-11-15
  Administered 2006-11-13: 11:00:00 via INTRAVENOUS

## 2006-11-13 MED ORDER — ENOXAPARIN 60 MG/0.6 ML SUB-Q SYRINGE
60 mg/0.6 mL | Freq: Two times a day (BID) | SUBCUTANEOUS | Status: DC
Start: 2006-11-13 — End: 2006-11-15
  Administered 2006-11-13 – 2006-11-15 (×5): via SUBCUTANEOUS

## 2006-11-13 MED ORDER — DIPHENHYDRAMINE HCL 50 MG/ML IJ SOLN
50 mg/mL | INTRAMUSCULAR | Status: AC
Start: 2006-11-13 — End: 2006-11-13

## 2006-11-13 MED ORDER — ZOLPIDEM 10 MG TAB
10 mg | Freq: Every evening | ORAL | Status: DC | PRN
Start: 2006-11-13 — End: 2006-11-15
  Administered 2006-11-14 – 2006-11-15 (×2): via ORAL

## 2006-11-13 MED ORDER — OXYCODONE 5 MG TAB
5 mg | ORAL | Status: DC | PRN
Start: 2006-11-13 — End: 2006-11-15
  Administered 2006-11-13 – 2006-11-15 (×11): via ORAL

## 2006-11-13 MED ORDER — WARFARIN 2 MG TAB
2 mg | Freq: Every evening | ORAL | Status: DC
Start: 2006-11-13 — End: 2006-11-13

## 2006-11-13 MED ORDER — ONDANSETRON 8 MG TAB, RAPID DISSOLVE
8 mg | ORAL | Status: DC | PRN
Start: 2006-11-13 — End: 2006-11-15

## 2006-11-13 MED ORDER — ONDANSETRON (PF) 4 MG/2 ML INJECTION
4 mg/2 mL | INTRAMUSCULAR | Status: DC | PRN
Start: 2006-11-13 — End: 2006-11-15
  Administered 2006-11-13 – 2006-11-15 (×3): via INTRAVENOUS

## 2006-11-13 MED ORDER — DIPHENHYDRAMINE HCL 50 MG/ML IJ SOLN
50 mg/mL | INTRAMUSCULAR | Status: DC | PRN
Start: 2006-11-13 — End: 2006-11-15
  Administered 2006-11-13 – 2006-11-14 (×3): via INTRAVENOUS

## 2006-11-13 MED ORDER — DULOXETINE 60 MG CAP, DELAYED RELEASE
60 mg | Freq: Every evening | ORAL | Status: DC
Start: 2006-11-13 — End: 2006-11-15
  Administered 2006-11-14 – 2006-11-15 (×2): via ORAL

## 2006-11-13 MED ORDER — HYDROMORPHONE 2 MG/ML INJECTION SOLUTION
2 mg/mL | INTRAMUSCULAR | Status: AC
Start: 2006-11-13 — End: 2006-11-13

## 2006-11-13 MED ORDER — WARFARIN 10 MG TAB
10 mg | Freq: Every evening | ORAL | Status: DC
Start: 2006-11-13 — End: 2006-11-15
  Administered 2006-11-14 – 2006-11-15 (×2): via ORAL

## 2006-11-13 MED ORDER — METOCLOPRAMIDE 5 MG/ML IJ SOLN
5 mg/mL | Freq: Four times a day (QID) | INTRAMUSCULAR | Status: DC | PRN
Start: 2006-11-13 — End: 2006-11-15

## 2006-11-13 MED ORDER — CLONAZEPAM 1 MG TAB
1 mg | Freq: Three times a day (TID) | ORAL | Status: DC
Start: 2006-11-13 — End: 2006-11-15
  Administered 2006-11-13 – 2006-11-14 (×3): via ORAL
  Administered 2006-11-14: 18:00:00
  Administered 2006-11-14: 16:00:00 via ORAL
  Administered 2006-11-14: 02:00:00
  Administered 2006-11-14 – 2006-11-15 (×3): via ORAL

## 2006-11-13 MED ORDER — METOCLOPRAMIDE 10 MG TAB
10 mg | Freq: Four times a day (QID) | ORAL | Status: DC | PRN
Start: 2006-11-13 — End: 2006-11-15
  Administered 2006-11-14: 01:00:00 via ORAL

## 2006-11-13 MED ORDER — HYDROMORPHONE 2 MG/ML INJECTION SOLUTION
2 mg/mL | Freq: Three times a day (TID) | INTRAMUSCULAR | Status: DC | PRN
Start: 2006-11-13 — End: 2006-11-14
  Administered 2006-11-13 – 2006-11-14 (×3): via INTRAVENOUS

## 2006-11-13 MED ORDER — WARFARIN 10 MG TAB
10 mg | Freq: Once | ORAL | Status: DC
Start: 2006-11-13 — End: 2006-11-13

## 2006-11-13 MED ORDER — ACETAMINOPHEN 325 MG TABLET
325 mg | Freq: Four times a day (QID) | ORAL | Status: DC | PRN
Start: 2006-11-13 — End: 2006-11-15

## 2006-11-13 MED ADMIN — diphenhydrAMINE (BENADRYL) 50 mg/mL injection: INTRAVENOUS | @ 08:00:00 | NDC 57866728801

## 2006-11-13 MED ADMIN — HYDROmorphone (DILAUDID) 2 mg/mL injection: INTRAVENOUS | @ 08:00:00 | NDC 00409131236

## 2006-11-13 MED ADMIN — Ondansetron (ZOFRAN) injection solution 4 mg: INTRAVENOUS | @ 04:00:00 | NDC 24200015844

## 2006-11-13 MED FILL — DIPHENHYDRAMINE HCL 50 MG/ML IJ SOLN: 50 mg/mL | INTRAMUSCULAR | Qty: 1

## 2006-11-13 MED FILL — OXYCODONE 5 MG TAB: 5 mg | ORAL | Qty: 2

## 2006-11-13 MED FILL — PREVACID SOLUTAB 30 MG DELAYED RELEASE,DISINTEGRATING TABLET: 30 mg | ORAL | Qty: 1

## 2006-11-13 MED FILL — CLONAZEPAM 1 MG TAB: 1 mg | ORAL | Qty: 1

## 2006-11-13 MED FILL — LOVENOX 80 MG/0.8 ML SUBCUTANEOUS SYRINGE: 80 mg/0.8 mL | SUBCUTANEOUS | Qty: 0.8

## 2006-11-13 MED FILL — MEN-PHOR 0.5 %-0.5 % LOTION: CUTANEOUS | Qty: 222

## 2006-11-13 MED FILL — HYDROMORPHONE 2 MG/ML INJECTION SOLUTION: 2 mg/mL | INTRAMUSCULAR | Qty: 1

## 2006-11-13 MED FILL — LOVENOX 60 MG/0.6 ML SUBCUTANEOUS SYRINGE: 60 mg/0.6 mL | SUBCUTANEOUS | Qty: 0.6

## 2006-11-13 MED FILL — DIPHENHYDRAMINE 50 MG CAP: 50 mg | ORAL | Qty: 1

## 2006-11-13 MED FILL — ONDANSETRON (PF) 4 MG/2 ML INJECTION: 4 mg/2 mL | INTRAMUSCULAR | Qty: 2

## 2006-11-13 NOTE — ED Notes (Signed)
DR Nat has been to bedside to evaluate and was medicated by jen rn-

## 2006-11-13 NOTE — H&P (Unsigned)
[facility] [place] [facility]   [address]   [place], [place]. [address]   [phone]     HISTORY AND PHYSICAL    NAME: Krista Lopez, Krista Lopez MR: [id number]  LOC: [id number] SEX: F ACCT: [id number]  DOB: [birth year] AGE: 41 PT: I  ADMIT: 11/13/2006 DSCH: MSV: MED    REASON FOR ADMISSION: Posteromedial chest pain.    HISTORY OF PRESENT ILLNESS: This is a chronically ill 41 year old  Caucasian female with significant past medical history for factor V  Leiden mutation as well as multiple pulmonary emboli episodes in the  past. She is status post [place] filter placement and currently is  maintained on outpatient Coumadin and every other day Lovenox therapy.  She has been admitted on multiple occasions to the [facility] and just most recently in August of this year did have a similar  presentation as such. At that time, she was treated and sent out on her  usual medications following workup and treatment for a 4-mm urethral  stone with multiple renal calculi that were altogether nonobstructing.  She was found to have a pulmonary embolus, in any event, with chronic  anticoagulation and also was attributed to have MRSA and Staphylococcus  epidermidis UTI. She is documented to have a bipolar depressive disorder  and ongoing nausea and vomiting with anemia, as well. Of course, she  also has factor V Leiden mutation attributed to her chronic medical  diagnoses.    She is again admitted for sudden onset of said pain just medial to the  left shoulder blade and this occurred with essentially no activity. She  has no other recent trauma or significant leg or arm swelling. She has  never had any focal distal emboli attributed to a central source. She  has no pain between said episodes, and her last episode would have been  otherwise documented in early to mid August per her review of [facility]  records. She has otherwise not been using pain medications per her   description. She currently, however, is requesting Dilaudid for her pain  as she is allergic to PREDNISONE and OTHER SIGNIFICANT MEDICATIONS.    She denies any melena or hematochezia. No cough or hemoptysis. No other  orthopnea or PND. She has no other syncopal episodes. No palpitations  per se, and she is currently speaking in full sentences on room air. Her  O2 saturation via pulse oximetry is easily greater than or equal to 98%.  She has no other pleurodynia or other similar chest-like congestion or  pain.    She is currently admitted and evaluated for a similar pain that she has  attributed to "exactly like that pain previously any time she has had  clots before." Given the problematic diagnosis seen in this woman who  has very poor venous anatomy as well as IV contrast dye allergy, it is  perplexing as to how to definitively rule out her underlying complaints  of chest pain with regards to possible new and/or acute-on-chronic  pulmonary emboli episodes.    She appears to be oxygenating quite well nevertheless, but I do not have  records readily available in front of me to properly indicate what her  presentation was like on prior said episodes where she, in fact, did have  embolic phenomenon.    PAST MEDICAL HISTORY:   1. Multiple PEs in the past.   2. Factor V Leiden deficiency.   3. Neuropathy.   4. Insomnia.   5. Bipolar affective  disorder.   6. Sleep disorder.   7. Appendectomy.   8. Tonsillectomy.   9. Port placement.   10. C-section times 2.   11. Recurrent chest pain episodes as above.   12. Chronic pain medication-seeking behavior.   13. 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    PAST SURGICAL HISTORY: As above.  CURRENT MEDICATIONS:   1. Klonopin 1 mg t.i.d.   2. Lovenox 100 mg taken once every other day.   3. Coumadin 5 mg taken everyday with outpatient dose arranged by her   primary care physician's office.   4. Cymbalta 120 mg p.o. daily.    5. Ambien 10 mg p.o. at bedtime p.r.n.    ALLERGIES:   1. CODEINE causes a rash.   2. PENICILLIN causes a rash.   3. PREDNISONE causes medication intolerance to side-effects of   swelling and labile mood.   4. TORADOL causes a rash.   5. SHELLFISH causes an intolerable rash. IV DYE causes something   similar to this and throat swelling shut.    SOCIAL HISTORY: Nonsmoker, nondrinker. No other history of illicit drug  use. No other intranasal drugs. Currently on disability.    She has tattoos, one of mention in her lower posterior back.    FAMILY HISTORY: No other familial history of thromboembolic disease or  similar diabetes mellitus or other malignancy. However, an exception to  this is significant female cancers on her maternal and sister side of the  family. Mother is deceased by cervical GYN cancer. Father is deceased  from colon cancer with metastasis. There is also a maternal grandmother  deceased from breast carcinoma with metastasis.    PHYSICAL EXAMINATION:  VITAL SIGNS: Presently include blood pressure of 133/65, pulse 94 and  otherwise regular, respirations 18, pulse oximetry 100% on room air,  temperature 98.6.  HEENT: Normocephalic, atraumatic. Mucous membranes are otherwise pink  and moist. Extraocular muscles are intact. Tympanic membranes are  clear.  NECK: Supple. No JVD. No lymphadenopathy. No hepatojugular reflux.  CHEST: Clear to the bases bilaterally. No other signs of pleural rub or  echophony. There is no increased or decreased vocal firmness about  auscultation of the medial portion of the left scapula.  CARDIAC: S1 and S2 auscultated. No other S3 or S4 gallop. There is no  S2 prominence. No other gallop or rub.  ABDOMEN: Soft, flat, and nontender. No CVA tenderness. No sacral edema.  No suprapubic fossa prominent. No palpable pulsatile aortic mass.  EXTREMITIES: Negative for cyanosis, clubbing, or edema. There are 1+ DP  pulses.   NEUROLOGIC: Mental status: Awake, alert, and oriented times 3. Cranial  nerves II-XII are grossly intact. Motor function is 4/5 in all  extremities.    LABS: Pertinent lab work demonstrates a negative 2-view chest x-ray and,  as well, demonstrates suboptimal INR of 1.2 whilst on outpatient  Coumadin. Remainder of her lab work is reviewed at length and is  essentially unrevealing with a pO2 on 2 L nasal cannula of 137 with a pH  of 7.53 and a pCo2 of 27. White count is 5.5, hemoglobin 10.7, and  platelet count is 352,000 and BMP is altogether unremarkable.    EKG is read as normal sinus rhythm at a rate of 98 beats per minute. No  other acute ST-T wave changes and no other previous baseline for  comparison. Good R wave progression throughout. Intervals are normal.    Urinalysis  shows gravity of greater than 1.030, pH of 6.5 and otherwise  trace leukocyte esterase with negative nitrite and trace protein.  Microscopy is unrevealing.    IMPRESSION:   1. Chest pain in posteromedial distribution more so in the left   posterior hemithorax.   2. Remote history of recurrent venous thromboembolism, question of  possible pulmonary embolism. I am not entirely convinced this is  explaining her current pain.   3. History of factor V Leiden mutation.   4. Anemia.   5. Anxiety and depression.    PLAN:   1. She will be admitted to remote telemetry and ruled out for   myocardial infarction biochemical enzyme protocol. We will also   advance her Lovenox to 65 mg subcutaneously b.i.d. for adequate   treatment of 1 mg/kg b.i.d. dosing. In the meanwhile, we will   up-titrate her Coumadin to allow for effective anticoagulation for   therapeutic effect and see how her INR does while under directly   observed medication administration here in the hospital. The   likelihood that she is not taking her Coumadin also does come to   mind.   2. We will check a V/Q scan and address pain control as appropriate   per my orders.    3. Finally, O2 support and old records to be obtained from [facility] will be   elicited p.r.n.   4. She is a category 1 status.                       __________________________________   Criss Rosales, MD A           This is an unverified document unless signed by physician.    TID: [id number] DT: 11/13/2006 5:35 A  JOB: [id number] DOC#: [id number] DD: 11/13/2006     cc: Criss Rosales, MD

## 2006-11-13 NOTE — ED Notes (Signed)
TRANSFER - OUT REPORT:    Verbal report given to Patricia(name) on Krista Lopez  being transferred to 2234(unit) for routine progression of care       Report consisted of patient???s Situation, Background, Assessment and   Recommendations(SBAR).     Information from the following report(s) ED Summary was reveiwed with the receiving nurse.    Opportunity for questions and clarification was provided.

## 2006-11-13 NOTE — ED Notes (Signed)
Discussed with Dr. Everlene Farrier who will see

## 2006-11-14 LAB — CBC W/O DIFF
HCT: 33 % — ABNORMAL LOW (ref 35.6–45.0)
HGB: 10.3 g/dL — ABNORMAL LOW (ref 11.7–15.0)
MCH: 28.9 PG (ref 26.1–32.9)
MCHC: 31.2 g/dL — ABNORMAL LOW (ref 31.4–35.0)
MCV: 92.7 FL (ref 79.6–97.8)
MPV: 11.2 FL (ref 9.3–12.9)
PLATELET: 302 10*3/uL (ref 140–440)
RBC: 3.56 M/uL — ABNORMAL LOW (ref 3.86–5.18)
RDW: 12.9 % (ref 11.9–14.6)
WBC: 3.9 10*3/uL — ABNORMAL LOW (ref 4.5–10.5)

## 2006-11-14 LAB — PROTHROMBIN TIME + INR
INR: 1.3 — ABNORMAL HIGH (ref 0.9–1.2)
Prothrombin time: 13.1 SECS — ABNORMAL HIGH (ref 9.5–11.1)

## 2006-11-14 LAB — METABOLIC PANEL, BASIC
Anion gap: 5 — ABNORMAL LOW (ref 7–16)
BUN: 11 MG/DL (ref 7–18)
CO2: 30 MMOL/L (ref 21–32)
Calcium: 8.5 MG/DL (ref 8.4–10.4)
Chloride: 105 MMOL/L (ref 98–107)
Creatinine: 0.8 MG/DL (ref 0.6–1.0)
GFR est AA: >60 mL/min/{1.73_m2} (ref >60)
GFR est non-AA: >60 mL/min/{1.73_m2} (ref >60)
Glucose: 86 MG/DL (ref 74–106)
Potassium: 4.4 MMOL/L (ref 3.5–5.1)
Sodium: 140 MMOL/L (ref 136–145)

## 2006-11-14 LAB — MAGNESIUM: Magnesium: 1.8 MG/DL (ref 1.8–2.4)

## 2006-11-14 LAB — PTT: aPTT: 32.9 s — ABNORMAL HIGH (ref 23.5–31.7)

## 2006-11-14 MED FILL — CLONAZEPAM 1 MG TAB: 1 mg | ORAL | Qty: 1

## 2006-11-14 MED FILL — DIPHENHYDRAMINE 50 MG CAP: 50 mg | ORAL | Qty: 1

## 2006-11-14 MED FILL — HYDROMORPHONE 2 MG/ML INJECTION SOLUTION: 2 mg/mL | INTRAMUSCULAR | Qty: 1

## 2006-11-14 MED FILL — DIPHENHYDRAMINE HCL 50 MG/ML IJ SOLN: 50 mg/mL | INTRAMUSCULAR | Qty: 1

## 2006-11-14 MED FILL — OXYCODONE 5 MG TAB: 5 mg | ORAL | Qty: 2

## 2006-11-14 MED FILL — CYMBALTA 60 MG CAPSULE,DELAYED RELEASE: 60 mg | ORAL | Qty: 2

## 2006-11-14 MED FILL — PREVACID SOLUTAB 30 MG DELAYED RELEASE,DISINTEGRATING TABLET: 30 mg | ORAL | Qty: 1

## 2006-11-14 MED FILL — LOVENOX 60 MG/0.6 ML SUBCUTANEOUS SYRINGE: 60 mg/0.6 mL | SUBCUTANEOUS | Qty: 0.6

## 2006-11-14 MED FILL — ONDANSETRON 8 MG TAB, RAPID DISSOLVE: 8 mg | ORAL | Qty: 1

## 2006-11-14 MED FILL — COUMADIN 10 MG TABLET: 10 mg | ORAL | Qty: 1

## 2006-11-14 MED FILL — ZOLPIDEM 10 MG TAB: 10 mg | ORAL | Qty: 1

## 2006-11-14 MED FILL — METOCLOPRAMIDE 10 MG TAB: 10 mg | ORAL | Qty: 1

## 2006-11-14 MED FILL — ONDANSETRON (PF) 4 MG/2 ML INJECTION: 4 mg/2 mL | INTRAMUSCULAR | Qty: 2

## 2006-11-14 MED FILL — MEN-PHOR 0.5 %-0.5 % LOTION: CUTANEOUS | Qty: 222

## 2006-11-15 LAB — PTT: aPTT: 35.9 s — ABNORMAL HIGH (ref 23.5–31.7)

## 2006-11-15 LAB — PROTHROMBIN TIME + INR
INR: 1.9 — ABNORMAL HIGH (ref 0.9–1.2)
Prothrombin time: 18 SECS — ABNORMAL HIGH (ref 9.5–11.1)

## 2006-11-15 MED ORDER — NEOMYCIN-BACITRACN ZN-POLYMYXIN 3.5 MG-400 UNIT-5,000 UNIT/G OINTMENT
Freq: Every day | CUTANEOUS | Status: DC
Start: 2006-11-15 — End: 2006-11-15

## 2006-11-15 MED FILL — COUMADIN 10 MG TABLET: 10 mg | ORAL | Qty: 1

## 2006-11-15 MED FILL — DIPHENHYDRAMINE 50 MG CAP: 50 mg | ORAL | Qty: 1

## 2006-11-15 MED FILL — HEPARIN LOCK 100 UNIT/ML INTRAVENOUS SOLUTION: 100 unit/mL | INTRAVENOUS | Qty: 10

## 2006-11-15 MED FILL — PREVACID SOLUTAB 30 MG DELAYED RELEASE,DISINTEGRATING TABLET: 30 mg | ORAL | Qty: 1

## 2006-11-15 MED FILL — CLONAZEPAM 1 MG TAB: 1 mg | ORAL | Qty: 1

## 2006-11-15 MED FILL — OXYCODONE 5 MG TAB: 5 mg | ORAL | Qty: 2

## 2006-11-15 MED FILL — TRIPLE ANTIBIOTIC 3.5 MG-400 UNIT-5,000 UNIT/GRAM TOPICAL OINTMENT: CUTANEOUS | Qty: 30

## 2006-11-15 MED FILL — ONDANSETRON (PF) 4 MG/2 ML INJECTION: 4 mg/2 mL | INTRAMUSCULAR | Qty: 2

## 2006-11-15 MED FILL — CYMBALTA 60 MG CAPSULE,DELAYED RELEASE: 60 mg | ORAL | Qty: 2

## 2006-11-15 MED FILL — ONDANSETRON 8 MG TAB, RAPID DISSOLVE: 8 mg | ORAL | Qty: 1

## 2006-11-15 MED FILL — ZOLPIDEM 10 MG TAB: 10 mg | ORAL | Qty: 1

## 2006-11-15 MED FILL — LOVENOX 60 MG/0.6 ML SUBCUTANEOUS SYRINGE: 60 mg/0.6 mL | SUBCUTANEOUS | Qty: 0.6

## 2006-11-23 NOTE — Discharge Summary (Unsigned)
[facility] [place] [facility]   [address]   [place], [place]. [address]   [phone]     DISCHARGE SUMMARY    NAME: Krista Lopez, Krista Lopez MR: [id number]  LOC: [id number] SEX: F ACCT: [id number]  DOB: [birth year] AGE: 41 PT: I  ADMIT: 11/13/2006 DSCH: 11/15/2006 MSV: MED    DATE OF ADMISSION: 11/13/06 by Dr. Everlene Farrier    DATE OF DISCHARGE: 11/15/06 by Dr. Velva Harman    PRIMARY CARE PHYSICIAN: Dr. Chyrl Civatte    UROLOGIST: Dr. Durward Parcel    REASON FOR ADMISSION: Posterior medial chest pain.    DISCHARGE DIAGNOSES:   1. Posterior medial chest pain. Both myocardial infarction and   pulmonary embolus have been ruled out. This appears to be   muscular.   2. Factor V Leyden mutation. Patient on chronic Lovenox/coumadin.    PAST MEDICAL HISTORY:   1. Factor V Leyden mutation with multiple pulmonary emboli.   2. Neuropathy.   3. Insomnia.   4. Bipolar affective disorder.   5. Sleep disorder.   6. Appendectomy.   7. Tonsillectomy.   8. Chronic pain medication seeking behavior.   9. History of multiple renal calculi, not obstructing.    DISCHARGE MEDICATIONS:   1. Coumadin 5 mg 1 p.o. q.h.s.   2. Lovenox 100 mg subcutaneous every other day.   3. Cymbalta 120 mg 1 p.o. once daily.   4. Klonopin 1 mg t.i.d.   5. Ambien CR 10 mg 1 p.o. q.h.s. p.r.n. insomnia.    HOSPITAL COURSE: Please refer to admission history and physical for  details. In summary, this is a chronically ill 7 -year-old Caucasian  female with a significant past medical history as listed above. Patient  presented to the emergency room with complaints of posterior medial chest  pain. Due to the patient's long-standing history of factor V Leyden and  multiple pulmonary emboli, a full work-up was completed. The patient  was unable to have a CT angiogram of the chest due to IVP dye allergy and  refusal on the part of the patient. With this, the patient did undergo  a VQ scan which was negative for any possible pulmonary emboli.   Troponin levels were checked and followed. They all remain negative,  ruling out a myocardial infarction. The patient was given Dilaudid for  pain during the first 24 hours of admission. The day after admission  when pain was improved, the patient was changed to oral pain medications.  Upon admission, the patient was found to be subtherapeutic INR of 1.2.  The patient follows up with Dr. Chyrl Civatte for PT and INR checks. Per  the patient, she followed up about a week and a half ago and had an INR  of 3.4. The patient currently takes coumadin 5 mg q.h.s. with Lovenox  100 mg subcutaneous every other day. The patient's coumadin dosages were  titrated to get a therapeutic INR. On the day of discharge, the  patient's INR is 1.9 with a PT of 18. The patient will be discharged  with instructions to continue coumadin 5 mg p.o. q.h.s. as well as  Lovenox 100 mg subcutaneous every other day. The patient is also  instructed to follow-up with Dr. Chyrl Civatte on Tuesday of this week  which will be 11/17/06 for an INR check. The patient was educated on the  importance of following up with Dr. Chyrl Civatte and she was in verbal  agreement with this.    During hospitalization, the patient's  home medications were continued as  the patient does have a long history of bipolar affective  disorder/anxiety/depression. These medications will also be continued  after discharge.    Patient also has a recent history of multiple renal calculi,  non-obstructing per history and physical. Per the patient, she has had  to undergo renal stents in the past. On the day of discharge, the  patient did complain of flank pain. When discussing plan of care and  treatment for this, including a CT scan and urinalysis to rule out kidney  stones, the patient adamantly refused any further treatment and  threatened to leave against medical advice. The patient will be  discharged home with a follow-up with her urologist, Dr. Durward Parcel. The   patient has been educated that she should follow-up with Dr. Katrinka Blazing this  week. The patient does have pain medications at home in the event that  this is a kidney stone.    PHYSICAL EXAMINATION: Subjective: The patient was asleep. She awakes,  drowsy, lethargic from recent pain medication administration.  Objective: vital signs: temperature 97.8, heart rate 81, respirations  18, blood pressure 120/71 with 02 sat of 98% on room air. INR 1.9.  General: The patient was alert but drowsy, oriented in no acute  distress. Cardiovascular: Regular rate and rhythm. Palpable pulses.  Pulmonary: Clear to auscultation bilaterally. Abdomen is soft,  positive bowel sounds. Genitourinary: Voiding without complaints.  Extremities: No edema observed. Moves all extremities well, ambulating  in room without assistance.    DISCHARGE CONDITION: Stable.    DISCHARGE DISPOSITION: To home.    DISCHARGE DIET: Regular.    DISCHARGE INSTRUCTIONS:   1. Discharge medications as previously listed above.   2. Follow-up appointments with Dr. Chyrl Civatte with a PT and INR on   Tuesday of this week. Dr. Herbie Wilmar should adjust coumadin/Lovenox   dosage as appropriate per PT and INR results. Patient should   follow-up closely with Dr. Chyrl Civatte over the next few weeks to   insure a therapeutic PT and INR.   3. Patient should follow-up with Dr. Durward Parcel, her urologist, as   patient has had recent symptoms of kidney stones. Again, patient   refused further work-up of this while hospitalized and stated that   she agreed to outpatient follow-up for this.    DISCHARGE TIME: Greater than 30 minutes.                     Dictated by Collier Bullock, FNP for:     __________________________________   Wonda Cerise, MD A     This is an unverified document unless signed by physician.    TID: [id number] DT: 11/23/2006 8:58 A  JOB: [id number] DOC#: [id number] DD: 11/15/2006    cc: Wonda Cerise, MD   Collier Bullock, FNP   Cathey Endow, DO    August Luz, MD

## 2006-11-24 LAB — METABOLIC PANEL, COMPREHENSIVE
A-G Ratio: 0.9 — ABNORMAL LOW (ref 1.2–3.5)
ALT (SGPT): 39 U/L (ref 30–65)
AST (SGOT): 21 U/L (ref 15–37)
Albumin: 3.5 GM/DL (ref 3.5–5.0)
Alk. phosphatase: 164 U/L — ABNORMAL HIGH (ref 50–136)
Anion gap: 10 (ref 7–16)
BUN: 9 MG/DL (ref 7–18)
Bilirubin, total: 0.2 MG/DL (ref 0.2–1.1)
CO2: 25 MMOL/L (ref 21–32)
Calcium: 9 MG/DL (ref 8.4–10.4)
Chloride: 106 MMOL/L (ref 98–107)
Creatinine: 0.7 MG/DL (ref 0.6–1.0)
GFR est AA: >60 mL/min/{1.73_m2} (ref >60)
GFR est non-AA: >60 mL/min/{1.73_m2} (ref >60)
Globulin: 3.7 GM/DL — ABNORMAL HIGH (ref 2.3–3.5)
Glucose: 101 MG/DL (ref 74–106)
Potassium: 3.8 MMOL/L (ref 3.5–5.1)
Protein, total: 7.2 GM/DL (ref 6.3–8.2)
Sodium: 141 MMOL/L (ref 136–145)

## 2006-11-24 LAB — CBC WITH AUTOMATED DIFF
ABS. IMM. GRANS.: 0 10*3/uL (ref 0.0–2.0)
ABS. LYMPHOCYTES: 0.9 10*3/uL (ref 0.6–4.3)
ABS. MONOCYTES: 0.5 10*3/uL (ref 0.1–0.9)
ABS. NEUTROPHILS: 5.2 10*3/uL (ref 1.9–7.8)
BASOPHILS: 0 % — ABNORMAL LOW (ref 0.1–1.6)
EOSINOPHILS: 2 % (ref 0.5–7.8)
HCT: 28.2 % — ABNORMAL LOW (ref 35.6–45.0)
HGB: 9.1 g/dL — ABNORMAL LOW (ref 11.7–15.0)
LYMPHOCYTES: 13 % — ABNORMAL LOW (ref 14.7–41.3)
MCH: 29.2 PG (ref 26.1–32.9)
MCHC: 32.3 g/dL (ref 31.4–35.0)
MCV: 90.4 FL (ref 79.6–97.8)
MONOCYTES: 8 % (ref 3.2–9.0)
MPV: 10.1 FL (ref 9.3–12.9)
NEUTROPHILS: 77 % — ABNORMAL HIGH (ref 47.0–74.6)
PLATELET: 237 10*3/uL (ref 140–440)
RBC: 3.12 M/uL — ABNORMAL LOW (ref 3.86–5.18)
RDW: 13.3 % (ref 11.9–14.6)
WBC: 6.8 10*3/uL (ref 4.5–10.5)

## 2006-11-24 LAB — PROTHROMBIN TIME + INR
INR: 2.3 — ABNORMAL HIGH (ref 0.9–1.2)
Prothrombin time: 21.6 SECS — ABNORMAL HIGH (ref 9.5–11.1)

## 2006-11-24 LAB — PTT: aPTT: 47.2 s — ABNORMAL HIGH (ref 23.5–31.7)

## 2006-11-24 MED ORDER — SALINE PERIPHERAL FLUSH PRN
INTRAMUSCULAR | Status: DC | PRN
Start: 2006-11-24 — End: 2006-11-24

## 2006-11-24 MED ORDER — SALINE PERIPHERAL FLUSH Q8H
Freq: Three times a day (TID) | INTRAMUSCULAR | Status: DC
Start: 2006-11-24 — End: 2006-11-24

## 2006-11-24 MED ORDER — HYDROMORPHONE 2 MG/ML INJECTION SOLUTION
2 mg/mL | INTRAMUSCULAR | Status: AC
Start: 2006-11-24 — End: 2006-11-24
  Administered 2006-11-24: 23:00:00 via INTRAVENOUS

## 2006-11-24 MED ORDER — DIPHENHYDRAMINE HCL 50 MG/ML IJ SOLN
50 mg/mL | INTRAMUSCULAR | Status: AC
Start: 2006-11-24 — End: 2006-11-24

## 2006-11-24 MED ORDER — SODIUM CHLORIDE 0.9 % IV
INTRAVENOUS | Status: DC
Start: 2006-11-24 — End: 2006-11-24
  Administered 2006-11-24: 23:00:00 via INTRAVENOUS

## 2006-11-24 MED ORDER — PROMETHAZINE 25 MG/ML INJECTION
25 mg/mL | INTRAMUSCULAR | Status: AC
Start: 2006-11-24 — End: 2006-11-24
  Administered 2006-11-24: 23:00:00 via INTRAVENOUS

## 2006-11-24 MED ADMIN — diphenhydrAMINE (BENADRYL) 50 mg/mL injection: @ 23:00:00 | NDC 57866728801

## 2006-11-24 MED FILL — HYDROMORPHONE 2 MG/ML INJECTION SOLUTION: 2 mg/mL | INTRAMUSCULAR | Qty: 1

## 2006-11-24 MED FILL — PROMETHAZINE 25 MG/ML INJECTION: 25 mg/mL | INTRAMUSCULAR | Qty: 1

## 2006-11-24 MED FILL — DIPHENHYDRAMINE HCL 50 MG/ML IJ SOLN: 50 mg/mL | INTRAMUSCULAR | Qty: 1

## 2006-11-24 NOTE — ED Notes (Signed)
VQ scan shows no new defects

## 2006-11-24 NOTE — ED Notes (Signed)
Life port access to R upper chest wall with 20Ga 1" huber needle done under sterile conditions, pt tolerated very well, 10ml waste before lab draw.  Pt remains tearful, IV meds given per orders, pt beginning to itch after dilaudid administration with pt stating that she needs benadryl after any IV med, MD notified.

## 2006-11-24 NOTE — ED Notes (Signed)
She states this pain is the "same place, same kind" as the last blood clot she had. She states "they didn't completely take care of it the last time"...which she specifies to mean "the clot was still there" when she went home. We talk about her body's ability to tear down and re-absorb that clot, over weeks, and not quickly.

## 2006-11-24 NOTE — ED Notes (Signed)
Pt states pain with respirations since after dischareg, currently crying, asking for pain medications for pain to L posterior mid lung.

## 2006-11-25 MED ORDER — HYDROMORPHONE 2 MG/ML INJECTION SOLUTION
2 mg/mL | INTRAMUSCULAR | Status: AC
Start: 2006-11-25 — End: 2006-11-24
  Administered 2006-11-25: 01:00:00 via INTRAVENOUS

## 2006-11-25 MED ORDER — HEPARIN LOCK FLUSH 100 UNIT/ML IV SOLN
100 unit/mL | INTRAVENOUS | Status: AC
Start: 2006-11-25 — End: 2006-11-24

## 2006-11-25 MED ADMIN — heparin (porcine) 100 unit/mL injection: INTRAVENOUS | @ 02:00:00 | NDC 00009029101

## 2006-11-25 MED FILL — HYDROMORPHONE 2 MG/ML INJECTION SOLUTION: 2 mg/mL | INTRAMUSCULAR | Qty: 1

## 2006-11-25 MED FILL — HEPARIN LOCK 100 UNIT/ML INTRAVENOUS SOLUTION: 100 unit/mL | INTRAVENOUS | Qty: 10

## 2006-12-01 LAB — URINE MICROSCOPIC
Casts: 0 /LPF
Crystals, urine: 0 /LPF
Mucus: 0 /LPF
RBC: >100 /HPF
WBC: 0 /HPF

## 2006-12-01 MED ORDER — DIPHENHYDRAMINE HCL 50 MG/ML IJ SOLN
50 mg/mL | INTRAMUSCULAR | Status: AC
Start: 2006-12-01 — End: 2006-12-01
  Administered 2006-12-01: 23:00:00 via INTRAVENOUS

## 2006-12-01 MED ORDER — HYDROMORPHONE 2 MG/ML INJECTION SOLUTION
2 mg/mL | INTRAMUSCULAR | Status: AC
Start: 2006-12-01 — End: 2006-12-01
  Administered 2006-12-01: 23:00:00 via INTRAVENOUS

## 2006-12-01 MED ADMIN — Ondansetron (ZOFRAN) 4 mg/2 mL injection solution: @ 23:00:00 | NDC 24200015844

## 2006-12-01 MED FILL — ONDANSETRON (PF) 4 MG/2 ML INJECTION: 4 mg/2 mL | INTRAMUSCULAR | Qty: 2

## 2006-12-01 MED FILL — HYDROMORPHONE 2 MG/ML INJECTION SOLUTION: 2 mg/mL | INTRAMUSCULAR | Qty: 1

## 2006-12-01 MED FILL — DIPHENHYDRAMINE HCL 50 MG/ML IJ SOLN: 50 mg/mL | INTRAMUSCULAR | Qty: 1

## 2006-12-01 NOTE — ED Provider Notes (Signed)
HPI Comments: 41 yo wf c/o left flank pain started  @ 24 hours ago got worse today.  She called Dr.Ron Smith's office today and was told to come here.   She had lithotripsy earlier this year for a left calculus.  Kidney Stone  [name] This is a recurrent problem. The current episode started 12 - 24 hours ago. The problem has been occurring constantly. The problem has been gradually worsening since onset. Associated symptoms include abdominal pain. The symptoms are worsened by nothing. The symptoms are relieved by nothing. Treatments Tried: advil. The treatment(s) provided mild relief.        Past Medical History   Diagnosis Date   ??? Pulmonary Embolism    ??? Factor V (Leiden) Deficiency    ??? Kidney Calculi    ??? Thromboembolus    ??? Other Ill-Defined Conditions      port right subclavian          Past Surgical History   Procedure Date   ??? Hx appendectomy    ??? Hx gyn      cysts from ovaries   ??? Hx tubal ligation    ??? Hx urological    ??? Hx heent    ??? Hx tonsillectomy    ??? Hx other surgical    ??? Cystoscopy    ??? Lithotripsy    ??? Hx tubal ligation            No family history on file.     History   Social History   ??? Marital Status: Legally Separated     Spouse Name: N/A     Number of Children: N/A   ??? Years of Education: N/A   Occupational History   ??? Not on file.   Social History Main Topics   ??? Tobacco Use: Never   ??? Alcohol Use: No   ??? Drug Use: No   ??? Sexually Active: Yes -- Female partner(s)     Birth Control/ Protection: Surgical   Other Topics Concern   ??? Not on file   Social History Narrative   ??? No narrative on file           ALLERGIES: Codeine, Pcn, Prednisone, Ketorolac tromethamine, Shellfish, Iv dye, iodine containing and Influenza virus vaccine      Review of Systems   Constitutional: Negative.    Skin: Negative.    HENT: Negative.    Eyes: Negative.    Cardiovascular: Negative.    Respiratory: Negative for cough.     Gastrointestinal: Positive for nausea and abdominal pain. Negative for heartburn, vomiting, diarrhea, constipation and blood in stool.   All other systems reviewed and are negative.      Filed Vitals:    12/01/2006  5:32 PM   BP: 113/78   Pulse: 104   Temp: 98.4 ??F (36.9 ??C)   Resp: 20   Height: 5\' 4"  (1.626 m)   Weight: 138 lb (62.596 kg)   SpO2: 99%              Physical Exam   Nursing note and vitals reviewed.  Constitutional: She appears well-developed and well-nourished. She appears distressed.   HENT:   Head: Normocephalic and atraumatic.   Right Ear: External ear normal.   Mouth/Throat: Oropharynx is clear and moist.   Eyes: Conjunctivae and extraocular motions are normal. Pupils are equal, round, and reactive to light.   Neck: Normal range of motion. Neck supple.  Cardiovascular: Normal rate, regular rhythm, normal heart sounds and intact distal pulses.    Abdominal: Tenderness is present.        Left flank tenderness.

## 2006-12-01 NOTE — ED Notes (Signed)
Pt medicated and placed in hall bed

## 2006-12-01 NOTE — ED Notes (Signed)
Received report from Barbie -

## 2006-12-01 NOTE — ED Notes (Signed)
I have reviewed discharge instructions with the patient.  The patient verbalized understanding.

## 2006-12-01 NOTE — ED Notes (Signed)
L flank pain since yesterday.

## 2006-12-01 NOTE — ED Notes (Signed)
Negative CT abd and pelvis    No obstructing stone but she does have some small stones.

## 2006-12-02 MED ORDER — HYDROMORPHONE 2 MG/ML INJECTION SOLUTION
2 mg/mL | INTRAMUSCULAR | Status: AC
Start: 2006-12-02 — End: 2006-12-01
  Administered 2006-12-02: 01:00:00 via INTRAVENOUS

## 2006-12-02 MED ORDER — DIPHENHYDRAMINE HCL 50 MG/ML IJ SOLN
50 mg/mL | INTRAMUSCULAR | Status: AC
Start: 2006-12-02 — End: 2006-12-01

## 2006-12-02 MED ORDER — HEPARIN LOCK FLUSH 100 UNIT/ML IV SOLN
100 unit/mL | INTRAVENOUS | Status: AC
Start: 2006-12-02 — End: 2006-12-01

## 2006-12-02 MED ADMIN — diphenhydrAMINE (BENADRYL) 50 mg/mL injection: @ 01:00:00 | NDC 57866728801

## 2006-12-02 MED ADMIN — heparin (porcine) 100 unit/mL injection: @ 03:00:00 | NDC 00009029101

## 2006-12-02 MED ADMIN — Ondansetron (ZOFRAN) injection solution 4 mg: INTRAVENOUS | @ 01:00:00 | NDC 24200015844

## 2006-12-02 MED FILL — ONDANSETRON (PF) 4 MG/2 ML INJECTION: 4 mg/2 mL | INTRAMUSCULAR | Qty: 2

## 2006-12-02 MED FILL — HYDROMORPHONE 2 MG/ML INJECTION SOLUTION: 2 mg/mL | INTRAMUSCULAR | Qty: 1

## 2006-12-02 MED FILL — HEPARIN LOCK 100 UNIT/ML INTRAVENOUS SOLUTION: 100 unit/mL | INTRAVENOUS | Qty: 10

## 2006-12-02 MED FILL — DIPHENHYDRAMINE HCL 50 MG/ML IJ SOLN: 50 mg/mL | INTRAMUSCULAR | Qty: 1

## 2006-12-16 NOTE — ED Notes (Signed)
FMD = Dr. Herbie [place]

## 2006-12-17 MED ORDER — PROMETHAZINE 25 MG/ML INJECTION
25 mg/mL | INTRAMUSCULAR | Status: AC
Start: 2006-12-17 — End: 2006-12-17
  Administered 2006-12-17: 06:00:00 via INTRAMUSCULAR

## 2006-12-17 MED ORDER — HYDROMORPHONE 2 MG/ML INJECTION SOLUTION
2 mg/mL | INTRAMUSCULAR | Status: AC
Start: 2006-12-17 — End: 2006-12-17
  Administered 2006-12-17: 06:00:00 via INTRAMUSCULAR

## 2006-12-17 MED FILL — PROMETHAZINE 25 MG/ML INJECTION: 25 mg/mL | INTRAMUSCULAR | Qty: 1

## 2006-12-17 MED FILL — HYDROMORPHONE 2 MG/ML INJECTION SOLUTION: 2 mg/mL | INTRAMUSCULAR | Qty: 1

## 2006-12-17 NOTE — ED Provider Notes (Signed)
HPI Comments: Pt with migraine headache starting yestersday. No relief with imitrex. Now still with headache and nausea, phenergran did help vomiting.Marland Kitchen  Headache   [name] This is a recurrent problem. The current episode started yesterday. The problem has been occurring constantly. The problem has been gradually worsening since onset. The headache is associated with bright light and vomiting. The pain is located in the left unilateral region. The quality of the pain is throbbing. The pain is at a severity of 9/10. Associated symptoms include dizziness, nausea and vomiting. Pertinent negatives include no fever and no visual change. She has tried triptan therapy for the symptoms. The treatment(s) provided no relief.        Past Medical History   Diagnosis Date   ??? Pulmonary Embolism    ??? Factor V (Leiden) Deficiency    ??? Kidney Calculi    ??? Thromboembolus      pulmonary emboli x 15   ??? Sleep Disorder      problems sleeping   ??? Neurological Disorder      hx migraines   ??? Other Ill-Defined Conditions      port right subclavian, hx kidney stones          Past Surgical History   Procedure Date   ??? Cystoscopy    ??? Lithotripsy    ??? Hx appendectomy    ??? Hx gyn      cysts from ovaries, c-section, tubal ligation   ??? Hx tubal ligation    ??? Hx tubal ligation    ??? Hx urological      for stones   ??? Hx heent    ??? Hx tonsillectomy    ??? Hx other surgical            No family history on file.     History   Social History   ??? Marital Status: Legally Separated     Spouse Name: N/A     Number of Children: N/A   ??? Years of Education: N/A   Occupational History   ??? Not on file.   Social History Main Topics   ??? Tobacco Use: Never   ??? Alcohol Use: No   ??? Drug Use: No   ??? Sexually Active: Yes -- Female partner(s)     Birth Control/ Protection: Surgical   Other Topics Concern   ??? Not on file   Social History Narrative   ??? No narrative on file            ALLERGIES: Codeine, Pcn, Prednisone, Ketorolac tromethamine, Shellfish, Iv dye, iodine containing and Influenza virus vaccine      Review of Systems   Constitutional: Negative for fever and chills.   HENT: Positive for headaches.    Gastrointestinal: Positive for nausea and vomiting.   Neurological: Positive for dizziness.   All other systems reviewed and are negative.      Filed Vitals:    12/16/2006 11:19 PM 12/17/2006 12:17 AM 12/17/2006 12:31 AM   BP: 117/72 121/57 137/80   Pulse: 79     Temp: 99.1 ??F (37.3 ??C)     Resp: 18     Height: 5\' 4"  (1.626 m)     Weight: 138 lb (62.596 kg)     SpO2: 100%                Physical Exam   Nursing note and vitals reviewed.  Constitutional: She is oriented. She appears well-developed and  well-nourished. She appears not diaphoretic. She appears distressed.   HENT:   Head: Normocephalic and atraumatic.   Right Ear: External ear normal.   Left Ear: External ear normal.   Nose: Nose normal.   Mouth/Throat: Oropharynx is clear and moist.   Eyes: Conjunctivae and extraocular motions are normal. Pupils are equal, round, and reactive to light. No scleral icterus.   Neck: Normal range of motion. Neck supple.   Cardiovascular: Normal rate, regular rhythm, normal heart sounds and intact distal pulses.    Pulmonary/Chest: Effort normal and breath sounds normal. No respiratory distress. She exhibits no tenderness.   Abdominal: Bowel sounds are normal. She exhibits no distension. No tenderness.   Musculoskeletal: Normal range of motion. She exhibits no edema and no tenderness.   Neurological: She is alert and oriented. No cranial nerve deficit. She exhibits normal muscle tone.   Skin: Skin is warm and dry. No rash noted. She is not diaphoretic. No erythema. No pallor.   Psychiatric: She has a normal mood and affect. Her behavior is normal. Judgment and thought content normal.

## 2006-12-17 NOTE — ED Notes (Signed)
[facility]ide called for pt

## 2006-12-17 NOTE — ED Notes (Signed)
Pt states she has had a h/a since yesterday and Dr. Anthonette Legato is her dr and she took imitrex without response and also phenergan.

## 2006-12-29 MED ORDER — MEPERIDINE (PF) 50 MG/ML INJ SOLN
50 mg/ml | INTRAMUSCULAR | Status: AC
Start: 2006-12-29 — End: 2006-12-29
  Administered 2006-12-29: 21:00:00 via INTRAVENOUS

## 2006-12-29 MED ORDER — WATER FOR INJECTION, STERILE INJECTION
20 mg/mL (final conc.) | INTRAMUSCULAR | Status: AC
Start: 2006-12-29 — End: 2006-12-29
  Administered 2006-12-29: 19:00:00 via INTRAMUSCULAR

## 2006-12-29 MED ORDER — METOCLOPRAMIDE 5 MG/ML IJ SOLN
5 mg/mL | INTRAMUSCULAR | Status: AC
Start: 2006-12-29 — End: 2006-12-29
  Administered 2006-12-29: 21:00:00 via INTRAVENOUS

## 2006-12-29 MED ORDER — MEPERIDINE (PF) 50 MG/ML INJ SOLN
50 mg/ml | INTRAMUSCULAR | Status: AC
Start: 2006-12-29 — End: 2006-12-29
  Administered 2006-12-29: 19:00:00 via INTRAMUSCULAR

## 2006-12-29 MED FILL — DEMEROL (PF) 50 MG/ML INJECTION SYRINGE: 50 mg/mL | INTRAMUSCULAR | Qty: 1

## 2006-12-29 MED FILL — METOCLOPRAMIDE 5 MG/ML IJ SOLN: 5 mg/mL | INTRAMUSCULAR | Qty: 2

## 2006-12-29 MED FILL — GEODON 20 MG/ML (FINAL CONCENTRATION) INTRAMUSCULAR SOLUTION: 20 mg/mL (final conc.) | INTRAMUSCULAR | Qty: 1

## 2006-12-29 NOTE — ED Notes (Signed)
I have reviewed discharge instructions with the patient.  The patient verbalized understanding.

## 2006-12-29 NOTE — ED Notes (Signed)
PMD-Dr Herbie [place]. Pt states that she developed a migraine headache behind her L eye yesterday around 5 pm. Has taken her medication has prescribed without relief, she states that she has thrown up and doesn't knoww if the medications had time to take effect. She states that it has been 6 or 7 months since she had a migraine and she has now had 2 with the past month. Denies difficulty speaking or weakness of any extremity. Blanket given and lights off for comfort.

## 2006-12-29 NOTE — ED Notes (Signed)
Pt to CT via w/c.
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
## 2007-01-06 NOTE — ED Notes (Signed)
Still Scratching and c/o itching, Dr. Jeanice Lim informed.

## 2007-01-06 NOTE — ED Notes (Signed)
Received via EMS with c/o abd pain, nausea and vomiting.

## 2007-01-06 NOTE — ED Provider Notes (Signed)
HPI Comments: 41 yo white female presents with history of RUQ abdominal pain that radiates to the back since 11:00 am today.  Pateint c/o nausea with vomiting without fever.  Staes taht she has had [place] and HIDA Scan that demonstrate gall bladder disease (these were apparently done 7 months ago but the patient has not seen a Careers adviser).  Abdominal Pain   [name] This is a recurrent problem. The current episode started 6 - 12 hours ago. The problem has been occurring constantly. The problem has been gradually worsening since onset. The pain is associated with vomiting. The pain is moderate. Associated symptoms include anorexia, nausea, vomiting and back pain. Pertinent negatives include no fever, no belching, no diarrhea, no flatus, no hematochezia, no melena, no constipation, no dysuria, no frequency, no hematuria, no arthralgias, no myalgias, no trauma and no chest pain. The pain is made worse by nothing. The pain is relieved by nothing. Past workup includes ultrasound.  Past workup includes no CT scan, no surgery, no esophagogastroduodenoscopy, no UGI and no colonoscopy and no barium enema. The patient's surgical history includes appendectomy.       Past Medical History   Diagnosis Date   ??? Pulmonary Embolism    ??? Factor V (Leiden) Deficiency    ??? Kidney Calculi    ??? Thromboembolus      pulmonary emboli x 15   ??? Cholecystitis with Cholelithiasis    ??? Chronic Kidney Disease      Renal stones   ??? Sleep Disorder      problems sleeping   ??? Neurological Disorder      hx migraines   ??? Other Ill-Defined Conditions      port right subclavian, hx kidney stones          Past Surgical History   Procedure Date   ??? Cystoscopy    ??? Lithotripsy    ??? Hx appendectomy    ??? Hx gyn      cysts from ovaries, c-section, tubal ligation   ??? Hx tubal ligation    ??? Hx tubal ligation    ??? Hx urological      for stones   ??? Hx heent    ??? Hx tonsillectomy    ??? Hx other surgical            No family history on file.      History   Social History   ??? Marital Status: Legally Separated     Spouse Name: N/A     Number of Children: N/A   ??? Years of Education: N/A   Occupational History   ??? Not on file.   Social History Main Topics   ??? Tobacco Use: Never   ??? Alcohol Use: No   ??? Drug Use: No   ??? Sexually Active: Yes -- Female partner(s)     Birth Control/ Protection: Surgical   Other Topics Concern   ??? Not on file   Social History Narrative   ??? No narrative on file           ALLERGIES: Codeine, Pcn, Prednisone, Ketorolac tromethamine, Shellfish, Iv dye, iodine containing and Influenza virus vaccine      Review of Systems   Constitutional: Negative for fever, chills, weight loss, malaise/fatigue, diaphoresis and weakness.   Skin: Negative for rash.   Cardiovascular: Negative for chest pain, palpitations, leg swelling and PND.   Respiratory: Negative for cough, hemoptysis and sputum production.  Is not  experiencing shortness of breath or wheezing.   Gastrointestinal: Positive for nausea, vomiting and abdominal pain. Negative for diarrhea, constipation, blood in stool and melena.   Genitourinary: Negative for dysuria, frequency and hematuria.   Musculoskeletal: Positive for back pain. Negative for myalgias, neck pain and joint pain.   Neurological: Negative for dizziness, tingling, tremors, sensory change and focal weakness.   Psychiatric: Positive for depression.         History of depression.       Filed Vitals:    01/06/2007  7:51 PM   BP: 108/65   Pulse: 112   Temp: 97.8 ??F (36.6 ??C)   Resp: 20   SpO2: 100%              Physical Exam   Nursing note and vitals reviewed.  Constitutional: She is oriented. She appears well-developed and well-nourished. She appears not diaphoretic.        It is difficult to evaluate the patient's pain because she talks without apparent discomfort during the history, but cries and moans when not distracted by history taking.   HENT:   Head: Normocephalic and atraumatic.    Mouth/Throat: Oropharynx is clear and moist.   Eyes: Conjunctivae and extraocular motions are normal. Right eye exhibits no discharge. Left eye exhibits no discharge. No scleral icterus.   Neck: Normal range of motion. Neck supple. No tracheal deviation present. No thyromegaly present.   Cardiovascular: Regular rhythm, S1 normal and S2 normal.   No extrasystoles are present. Tachycardia present.  Exam reveals no gallop, no S3, no S4, no distant heart sounds and no friction rub.    No murmur heard.  Pulmonary/Chest: Effort normal and breath sounds normal. No stridor. No respiratory distress. She has no wheezes. She has no rales. She exhibits no tenderness.   Abdominal: Bowel sounds are normal. She exhibits no distension, no pulsatile liver, no abdominal bruit, no ascites, no pulsatile midline mass and no mass. Soft. There is no organomegaly, splenomegaly or hepatomegaly. Tenderness is present in the right upper quadrant. She has no rigidity, no rebound, no guarding, no CVA tenderness, no pain at McBurney's point and no Murphy's sign. No hernia. Hernia confirmed negative in the ventral area.        Mild RUQ tenderness without peritoneal signs.   Musculoskeletal: Normal range of motion. She exhibits no edema and no tenderness.   Neurological: She is alert and oriented. She exhibits normal muscle tone. Coordination normal.   Skin: Skin is warm and dry. No rash noted. She is not diaphoretic. No erythema. No pallor.   Psychiatric: She has a normal mood and affect.

## 2007-01-06 NOTE — ED Notes (Incomplete)
The patient's previous charts were reviewed and the patient has had 3 RUQ ultrasounds- none of which revealed any evidence of gallbladder disease.

## 2007-01-06 NOTE — ED Notes (Signed)
Conts to c/o itching and crying. Dr. Jeanice Lim in to r-eval.

## 2007-01-07 LAB — METABOLIC PANEL, COMPREHENSIVE
A-G Ratio: 1.1 — ABNORMAL LOW (ref 1.2–3.5)
ALT (SGPT): 41 U/L (ref 30–65)
AST (SGOT): 29 U/L (ref 15–37)
Albumin: 4.1 GM/DL (ref 3.5–5.0)
Alk. phosphatase: 184 U/L — ABNORMAL HIGH (ref 50–136)
Anion gap: 5 — ABNORMAL LOW (ref 7–16)
BUN: 25 MG/DL — ABNORMAL HIGH (ref 7–18)
Bilirubin, total: 0.2 MG/DL (ref 0.2–1.1)
CO2: 29 MMOL/L (ref 21–32)
Calcium: 9.3 MG/DL (ref 8.4–10.4)
Chloride: 101 MMOL/L (ref 98–107)
Creatinine: 0.9 MG/DL (ref 0.6–1.0)
GFR est AA: >60 mL/min/{1.73_m2} (ref >60)
GFR est non-AA: >60 mL/min/{1.73_m2} (ref >60)
Globulin: 3.8 GM/DL — ABNORMAL HIGH (ref 2.3–3.5)
Glucose: 95 MG/DL (ref 74–106)
Potassium: 4.3 MMOL/L (ref 3.5–5.1)
Protein, total: 7.9 GM/DL (ref 6.3–8.2)
Sodium: 135 MMOL/L — ABNORMAL LOW (ref 136–145)

## 2007-01-07 LAB — CBC WITH AUTOMATED DIFF
ABS. IMM. GRANS.: 0 10*3/uL (ref 0.0–2.0)
ABS. LYMPHOCYTES: 1.5 10*3/uL (ref 0.6–4.3)
ABS. MONOCYTES: 0.3 10*3/uL (ref 0.1–0.9)
ABS. NEUTROPHILS: 3.3 10*3/uL (ref 1.9–7.8)
BASOPHILS: 1 % (ref 0.1–1.6)
EOSINOPHILS: 4 % (ref 0.5–7.8)
HCT: 38.2 % (ref 35.6–45.0)
HGB: 12.5 g/dL (ref 11.7–15.0)
LYMPHOCYTES: 28 % (ref 14.7–41.3)
MCH: 30.7 PG (ref 26.1–32.9)
MCHC: 32.7 g/dL (ref 31.4–35.0)
MCV: 93.9 FL (ref 79.6–97.8)
MONOCYTES: 6 % (ref 3.2–9.0)
MPV: 10.7 FL (ref 9.3–12.9)
NEUTROPHILS: 61 % (ref 47.0–74.6)
PLATELET: 315 10*3/uL (ref 140–440)
RBC: 4.07 M/uL (ref 3.86–5.18)
RDW: 14.4 % (ref 11.9–14.6)
WBC: 5.3 10*3/uL (ref 4.5–10.5)

## 2007-01-07 LAB — PROTHROMBIN TIME + INR
INR: 1.1 (ref 0.9–1.2)
Prothrombin time: 10.7 SECS (ref 9.5–11.1)

## 2007-01-07 LAB — LIPASE: Lipase: 204 U/L (ref 114–286)

## 2007-01-07 LAB — LACTIC ACID: Lactic acid: 1.1 MMOL/L (ref 0.4–2.0)

## 2007-01-07 MED ORDER — SODIUM CHLORIDE 0.9% BOLUS IV
0.9 % | INTRAVENOUS | Status: DC
Start: 2007-01-07 — End: 2007-01-07

## 2007-01-07 MED ORDER — NALBUPHINE 10 MG/ML INJECTION
10 mg/mL | Freq: Once | INTRAMUSCULAR | Status: AC | PRN
Start: 2007-01-07 — End: 2007-01-06
  Administered 2007-01-07: 02:00:00 via INTRAVENOUS

## 2007-01-07 MED ORDER — HYDROMORPHONE 2 MG/ML INJECTION SOLUTION
2 mg/mL | INTRAMUSCULAR | Status: AC
Start: 2007-01-07 — End: 2007-01-06
  Administered 2007-01-07: 05:00:00 via INTRAVENOUS

## 2007-01-07 MED ORDER — HYDROXYZINE HCL 50 MG/ML IM SOLN
50 mg/mL | INTRAMUSCULAR | Status: AC
Start: 2007-01-07 — End: 2007-01-06

## 2007-01-07 MED ORDER — DIPHENHYDRAMINE HCL 50 MG/ML IJ SOLN
50 mg/mL | INTRAMUSCULAR | Status: AC
Start: 2007-01-07 — End: 2007-01-06

## 2007-01-07 MED ORDER — SODIUM CHLORIDE 0.9 % IV
INTRAVENOUS | Status: DC
Start: 2007-01-07 — End: 2007-01-07
  Administered 2007-01-07: 02:00:00 via INTRAVENOUS

## 2007-01-07 MED ADMIN — diphenhydrAMINE (BENADRYL) 50 mg/mL injection: @ 03:00:00 | NDC 57866728801

## 2007-01-07 MED ADMIN — hydrOXYzine (VISTARIL) 50 mg/mL injection: INTRAMUSCULAR | @ 03:00:00 | NDC 39769002405

## 2007-01-07 MED ADMIN — Ondansetron (ZOFRAN) injection solution 4 mg: INTRAVENOUS | @ 02:00:00 | NDC 24200015844

## 2007-01-07 MED ADMIN — heparin (porcine) 100 unit/mL injection: INTRAVENOUS | @ 05:00:00 | NDC 00009029101

## 2007-01-07 MED FILL — DIPHENHYDRAMINE HCL 50 MG/ML IJ SOLN: 50 mg/mL | INTRAMUSCULAR | Qty: 1

## 2007-01-07 MED FILL — HEPARIN LOCK 100 UNIT/ML INTRAVENOUS SOLUTION: 100 unit/mL | INTRAVENOUS | Qty: 10

## 2007-01-07 MED FILL — HYDROMORPHONE 2 MG/ML INJECTION SOLUTION: 2 mg/mL | INTRAMUSCULAR | Qty: 1

## 2007-01-07 MED FILL — NALBUPHINE 10 MG/ML INJECTION: 10 mg/mL | INTRAMUSCULAR | Qty: 1

## 2007-01-07 MED FILL — ONDANSETRON (PF) 4 MG/2 ML INJECTION: 4 mg/2 mL | INTRAMUSCULAR | Qty: 2

## 2007-01-07 MED FILL — HYDROXYZINE HCL 50 MG/ML IM SOLN: 50 mg/mL | INTRAMUSCULAR | Qty: 1

## 2007-01-15 ENCOUNTER — Encounter
Admit: 2007-01-15 | Discharge: 2007-02-02 | Disposition: A | Discharge status: Home / Self Care | Source: Home / Self Care | Attending: Internal Medicine | Admitting: Internal Medicine

## 2007-01-15 LAB — CBC WITH AUTOMATED DIFF
ABS. IMM. GRANS.: 0 10*3/uL (ref 0.0–2.0)
ABS. LYMPHOCYTES: 1.4 10*3/uL (ref 0.6–4.3)
ABS. MONOCYTES: 0.5 10*3/uL (ref 0.1–0.9)
ABS. NEUTROPHILS: 4.8 10*3/uL (ref 1.9–7.8)
BASOPHILS: 0 % — ABNORMAL LOW (ref 0.1–1.6)
EOSINOPHILS: 1 % (ref 0.5–7.8)
HCT: 26.9 % — ABNORMAL LOW (ref 35.6–45.0)
HGB: 8.7 g/dL — ABNORMAL LOW (ref 11.7–15.0)
LYMPHOCYTES: 20 % (ref 14.7–41.3)
MCH: 30.1 PG (ref 26.1–32.9)
MCHC: 32.3 g/dL (ref 31.4–35.0)
MCV: 93.1 FL (ref 79.6–97.8)
MONOCYTES: 7 % (ref 3.2–9.0)
MPV: 11.2 FL (ref 9.3–12.9)
NEUTROPHILS: 72 % (ref 47.0–74.6)
PLATELET: 74 10*3/uL — ABNORMAL LOW (ref 140–440)
RBC: 2.89 M/uL — ABNORMAL LOW (ref 3.86–5.18)
RDW: 14.8 % — ABNORMAL HIGH (ref 11.9–14.6)
WBC: 6.8 10*3/uL (ref 4.5–10.5)

## 2007-01-15 LAB — METABOLIC PANEL, COMPREHENSIVE
A-G Ratio: 1 — ABNORMAL LOW (ref 1.2–3.5)
ALT (SGPT): 75 U/L — ABNORMAL HIGH (ref 30–65)
AST (SGOT): 73 U/L — ABNORMAL HIGH (ref 15–37)
Albumin: 2.7 GM/DL — ABNORMAL LOW (ref 3.5–5.0)
Alk. phosphatase: 222 U/L — ABNORMAL HIGH (ref 50–136)
Anion gap: 5 — ABNORMAL LOW (ref 7–16)
BUN: 5 MG/DL — ABNORMAL LOW (ref 7–18)
Bilirubin, total: 0.2 MG/DL (ref 0.2–1.1)
CO2: 24 MMOL/L (ref 21–32)
Calcium: 8.2 MG/DL — ABNORMAL LOW (ref 8.4–10.4)
Chloride: 114 MMOL/L — ABNORMAL HIGH (ref 98–107)
Creatinine: 0.8 MG/DL (ref 0.6–1.0)
GFR est AA: >60 mL/min/{1.73_m2} (ref >60)
GFR est non-AA: >60 mL/min/{1.73_m2} (ref >60)
Globulin: 2.7 GM/DL (ref 2.3–3.5)
Glucose: 103 MG/DL (ref 74–106)
Potassium: 4.4 MMOL/L (ref 3.5–5.1)
Protein, total: 5.4 GM/DL — ABNORMAL LOW (ref 6.3–8.2)
Sodium: 143 MMOL/L (ref 136–145)

## 2007-01-15 LAB — URINALYSIS W/O MICRO
Bilirubin: NEGATIVE
Blood: NEGATIVE
Casts: 0 /LPF
Crystals, urine: 0 /LPF
Glucose: NEGATIVE MG/DL
Ketone: NEGATIVE MG/DL
Leukocyte Esterase: NEGATIVE
Mucus: 0 /LPF
Nitrites: NEGATIVE
Protein: NEGATIVE MG/DL
RBC: 0 /HPF
Specific gravity: 1.02 (ref 1.001–1.02)
Urobilinogen: 0.2 EU/DL (ref 0.2–1.0)
WBC: 0 /HPF
pH (UA): 5.5 (ref 5.0–9.0)

## 2007-01-15 LAB — PREALBUMIN: Prealbumin: 13.5 MG/DL — ABNORMAL LOW (ref 18.0–35.7)

## 2007-01-15 LAB — PROTHROMBIN TIME + INR
INR: 1.9 — ABNORMAL HIGH (ref 0.9–1.2)
Prothrombin time: 18.6 SECS — ABNORMAL HIGH (ref 9.5–11.1)

## 2007-01-15 LAB — SED RATE (ESR): Sed rate (ESR): 78 MM/HR — ABNORMAL HIGH (ref 0–20)

## 2007-01-15 LAB — MAGNESIUM: Magnesium: 1.8 MG/DL (ref 1.8–2.4)

## 2007-01-15 LAB — FOLATE: Folate: 11.5 ng/mL (ref >5.38)

## 2007-01-15 LAB — TSH, 2ND GENERATION: TSH,2nd Generation: 4 u[IU]/mL (ref 0.350–5.50)

## 2007-01-15 LAB — VITAMIN B12: Vitamin B12: 273 pg/mL (ref >200)

## 2007-01-16 LAB — C DIFFICILE TOXIN A & B BY EIA: C. difficile toxin A&B: NEGATIVE

## 2007-01-17 LAB — CULTURE, URINE
Culture result:: NO GROWTH
Culture: NO GROWTH

## 2007-01-17 LAB — CBC WITH AUTOMATED DIFF
ABS. IMM. GRANS.: 0 10*3/uL (ref 0.0–2.0)
ABS. LYMPHOCYTES: 1.4 10*3/uL (ref 0.6–4.3)
ABS. MONOCYTES: 0.5 10*3/uL (ref 0.1–0.9)
ABS. NEUTROPHILS: 2.1 10*3/uL (ref 1.9–7.8)
BASOPHILS: 0 % — ABNORMAL LOW (ref 0.1–1.6)
EOSINOPHILS: 3 % (ref 0.5–7.8)
HCT: 27.6 % — ABNORMAL LOW (ref 35.6–45.0)
HGB: 9 g/dL — ABNORMAL LOW (ref 11.7–15.0)
IMMATURE GRANULOCYTES: 0.5 % (ref 0.0–2.0)
LYMPHOCYTES: 34 % (ref 14.7–41.3)
MCH: 30.2 PG (ref 26.1–32.9)
MCHC: 32.6 g/dL (ref 31.4–35.0)
MCV: 92.6 FL (ref 79.6–97.8)
MONOCYTES: 11 % — ABNORMAL HIGH (ref 3.2–9.0)
MPV: 12.2 FL (ref 9.3–12.9)
NEUTROPHILS: 52 % (ref 47.0–74.6)
PLATELET: 108 10*3/uL — ABNORMAL LOW (ref 140–440)
RBC: 2.98 M/uL — ABNORMAL LOW (ref 3.86–5.18)
RDW: 14.8 % — ABNORMAL HIGH (ref 11.9–14.6)
WBC: 4.2 10*3/uL — ABNORMAL LOW (ref 4.5–10.5)

## 2007-01-17 LAB — METABOLIC PANEL, COMPREHENSIVE
A-G Ratio: 0.8 — ABNORMAL LOW (ref 1.2–3.5)
ALT (SGPT): 52 U/L (ref 30–65)
AST (SGOT): 24 U/L (ref 15–37)
Albumin: 2.5 GM/DL — ABNORMAL LOW (ref 3.5–5.0)
Alk. phosphatase: 193 U/L — ABNORMAL HIGH (ref 50–136)
Anion gap: 6 — ABNORMAL LOW (ref 7–16)
BUN: 2 MG/DL — ABNORMAL LOW (ref 7–18)
Bilirubin, total: 0.2 MG/DL (ref 0.2–1.1)
CO2: 31 MMOL/L (ref 21–32)
Calcium: 8.3 MG/DL — ABNORMAL LOW (ref 8.4–10.4)
Chloride: 105 MMOL/L (ref 98–107)
Creatinine: 0.7 MG/DL (ref 0.6–1.0)
GFR est AA: >60 mL/min/{1.73_m2} (ref >60)
GFR est non-AA: >60 mL/min/{1.73_m2} (ref >60)
Globulin: 3.3 GM/DL (ref 2.3–3.5)
Glucose: 93 MG/DL (ref 74–106)
Potassium: 3.2 MMOL/L — ABNORMAL LOW (ref 3.5–5.1)
Protein, total: 5.8 GM/DL — ABNORMAL LOW (ref 6.3–8.2)
Sodium: 142 MMOL/L (ref 136–145)

## 2007-01-17 LAB — CULTURE, MRSA: Culture result:: DETECTED

## 2007-01-17 LAB — PROTHROMBIN TIME + INR
INR: 3.4 — ABNORMAL HIGH (ref 0.9–1.2)
Prothrombin time: 30.6 SECS — ABNORMAL HIGH (ref 9.5–11.1)

## 2007-01-18 LAB — CBC WITH AUTOMATED DIFF
ABS. IMM. GRANS.: 0 10*3/uL (ref 0.0–2.0)
ABS. LYMPHOCYTES: 1.4 10*3/uL (ref 0.6–4.3)
ABS. MONOCYTES: 0.3 10*3/uL (ref 0.1–0.9)
ABS. NEUTROPHILS: 1.5 10*3/uL — ABNORMAL LOW (ref 1.9–7.8)
BASOPHILS: 0 % — ABNORMAL LOW (ref 0.1–1.6)
EOSINOPHILS: 4 % (ref 0.5–7.8)
HCT: 29.1 % — ABNORMAL LOW (ref 35.6–45.0)
HGB: 9.5 g/dL — ABNORMAL LOW (ref 11.7–15.0)
IMMATURE GRANULOCYTES: 0.9 % (ref 0.0–2.0)
LYMPHOCYTES: 41 % (ref 14.7–41.3)
MCH: 30 PG (ref 26.1–32.9)
MCHC: 32.6 g/dL (ref 31.4–35.0)
MCV: 91.8 FL (ref 79.6–97.8)
MONOCYTES: 9 % (ref 3.2–9.0)
MPV: 11.7 FL (ref 9.3–12.9)
NEUTROPHILS: 45 % — ABNORMAL LOW (ref 47.0–74.6)
PLATELET: 124 10*3/uL — ABNORMAL LOW (ref 140–440)
RBC: 3.17 M/uL — ABNORMAL LOW (ref 3.86–5.18)
RDW: 14.4 % (ref 11.9–14.6)
WBC: 3.3 10*3/uL — ABNORMAL LOW (ref 4.5–10.5)

## 2007-01-18 LAB — METABOLIC PANEL, BASIC
Anion gap: 10 (ref 7–16)
BUN: 4 MG/DL — ABNORMAL LOW (ref 7–18)
CO2: 27 MMOL/L (ref 21–32)
Calcium: 9.1 MG/DL (ref 8.4–10.4)
Chloride: 107 MMOL/L (ref 98–107)
Creatinine: 0.7 MG/DL (ref 0.6–1.0)
GFR est AA: >60 mL/min/{1.73_m2} (ref >60)
GFR est non-AA: >60 mL/min/{1.73_m2} (ref >60)
Glucose: 97 MG/DL (ref 74–106)
Potassium: 3.4 MMOL/L — ABNORMAL LOW (ref 3.5–5.1)
Sodium: 144 MMOL/L (ref 136–145)

## 2007-01-18 LAB — CULTURE, BLOOD/ARD

## 2007-01-18 LAB — PROTHROMBIN TIME + INR
INR: 6.8 — CR (ref 0.9–1.2)
INR: 6.6 — CR (ref 0.9–1.2)
Prothrombin time: 57.9 SECS — ABNORMAL HIGH (ref 9.5–11.1)
Prothrombin time: 56.4 SECS — ABNORMAL HIGH (ref 9.5–11.1)

## 2007-01-19 LAB — CULTURE, VRE SCREEN

## 2007-01-19 LAB — CULTURE, WOUND W GRAM STAIN: GRAM STAIN: 0

## 2007-01-19 LAB — CULTURE, BLOOD/ARD

## 2007-01-19 LAB — PROTHROMBIN TIME + INR
INR: 4.9 — CR (ref 0.9–1.2)
Prothrombin time: 43 SECS — ABNORMAL HIGH (ref 9.5–11.1)

## 2007-01-20 LAB — PROTHROMBIN TIME + INR
INR: 3.1 — ABNORMAL HIGH (ref 0.9–1.2)
Prothrombin time: 28.1 SECS — ABNORMAL HIGH (ref 9.5–11.1)

## 2007-01-20 LAB — CBC WITH AUTOMATED DIFF
ABS. IMM. GRANS.: 0 10*3/uL (ref 0.0–2.0)
ABS. LYMPHOCYTES: 0.6 10*3/uL (ref 0.6–4.3)
ABS. MONOCYTES: 0.5 10*3/uL (ref 0.1–0.9)
ABS. NEUTROPHILS: 3.6 10*3/uL (ref 1.9–7.8)
BASOPHILS: 0 % — ABNORMAL LOW (ref 0.1–1.6)
EOSINOPHILS: 5 % (ref 0.5–7.8)
HCT: 28.7 % — ABNORMAL LOW (ref 35.6–45.0)
HGB: 9.5 g/dL — ABNORMAL LOW (ref 11.7–15.0)
IMMATURE GRANULOCYTES: 0.8 % (ref 0.0–2.0)
LYMPHOCYTES: 13 % — ABNORMAL LOW (ref 14.7–41.3)
MCH: 30.2 PG (ref 26.1–32.9)
MCHC: 33.1 g/dL (ref 31.4–35.0)
MCV: 91.1 FL (ref 79.6–97.8)
MONOCYTES: 10 % — ABNORMAL HIGH (ref 3.2–9.0)
MPV: 10.4 FL (ref 9.3–12.9)
NEUTROPHILS: 71 % (ref 47.0–74.6)
PLATELET: 136 10*3/uL — ABNORMAL LOW (ref 140–440)
RBC: 3.15 M/uL — ABNORMAL LOW (ref 3.86–5.18)
RDW: 14.2 % (ref 11.9–14.6)
WBC: 5.1 10*3/uL (ref 4.5–10.5)

## 2007-01-20 NOTE — Procedures (Unsigned)
[facility]   [address]   [place], [place]. [address]   [phone]     ECHOCARDIOGRAM REPORT    NAME: Krista Lopez, Krista Lopez MR: [id number]  LOC: [id number] SEX: F ACCT: [id number]  DOB: 01-08-1966 AGE: 41 PT: 4  ADMIT: 01/15/2007 [facility]:    DATE: 01/20/2007    REASON FOR EXAMINATION: Vegetation, ASD.    REFERRING PHYSICIAN: Dr. Devin Going    CLINICAL HISTORY: Pulmonary emboli.    MEASUREMENTS: RV 3.4 cm, IVSD 1.2 cm, LVIDD 4.8 cm, PW 1.0 cm, LVIDS  2.6, EF 55-60%, LA 4.2 cm, aortic root 3.0 cm.    FINDINGS:  Technical Quality: This is a technically adequate study.    Left Atrium: The left atrium is mildly enlarged and measures 4.2 cm in  diameter. Interatrial septum is intact.    Right Atrium: The right atrium is of normal size.    Right Ventricle: The right ventricle is of normal size. RV systolic  function is grossly normal.    Left Ventricle: The left ventricle is of normal size. LV segmental wall  motion is normal. Estimated LV ejection fraction is approximately  55-60%. LV diastolic function was normal.    Aortic Valve: The aortic valve is a trileaflet structure. There is  trace aortic regurgitation present.    Mitral Valve: The mitral valve is grossly normal in appearance. Trace  to mild mitral regurgitation is present. No vegetation is noted on the  mitral valve.    Tricuspid Valve: The tricuspid valve is grossly normal in appearance.  Mild to moderate tricuspid regurgitation is present. Estimated RV  systolic blood pressure is 37 mmHg and this is mildly elevated.    Pulmonic Valve: The pulmonic valve itself is poorly visualized. Trace  pulmonic insufficiency is present.    A tiny pericardial effusion is present. It does not appear to be of any  hemodynamic significance.    Great Vessels: The aortic root is normal in appearance and measures 3.0  cm in diameter.    IMPRESSION:   1. Normal LV systolic function.   2. Mild concentric left ventricular hypertrophy.   3. Mild LV diastolic dysfunction.    4. Normal LV diastolic function.   5. Trace to mild mitral regurgitation with left atrial enlargement.   6. Trace aortic insufficiency.   7. Mild to moderate tricuspid regurgitation.   8. Trace pulmonic insufficiency.   9. Tiny pericardial effusion.   10. Estimated RV systolic blood pressure is 37 mmHg.   11. There is no evidence of valvular vegetation on this study.          _____________________________________  Nestor Lewandowsky, MD A     This is an unverified document unless signed by physician.    TID: sms DT: 01/20/2007 2:40 P  JOB: [id number] DOC#: [id number] DD: 01/20/2007    cc: Nestor Lewandowsky, MD   Oleta Gunnoe.E. Calton Dach, MD

## 2007-01-21 LAB — PROTHROMBIN TIME + INR
INR: 2.2 — ABNORMAL HIGH (ref 0.9–1.2)
Prothrombin time: 21.1 SECS — ABNORMAL HIGH (ref 9.5–11.1)

## 2007-01-22 LAB — CBC WITH AUTOMATED DIFF
ABS. IMM. GRANS.: 0 10*3/uL (ref 0.0–2.0)
ABS. LYMPHOCYTES: 0.9 10*3/uL (ref 0.6–4.3)
ABS. MONOCYTES: 0.6 10*3/uL (ref 0.1–0.9)
ABS. NEUTROPHILS: 1.5 10*3/uL — ABNORMAL LOW (ref 1.9–7.8)
BASOPHILS: 0 % — ABNORMAL LOW (ref 0.1–1.6)
EOSINOPHILS: 5 % (ref 0.5–7.8)
HCT: 27 % — ABNORMAL LOW (ref 35.6–45.0)
HGB: 8.8 g/dL — ABNORMAL LOW (ref 11.7–15.0)
IMMATURE GRANULOCYTES: 0.3 % (ref 0.0–2.0)
LYMPHOCYTES: 27 % (ref 14.7–41.3)
MCH: 29.6 PG (ref 26.1–32.9)
MCHC: 32.6 g/dL (ref 31.4–35.0)
MCV: 90.9 FL (ref 79.6–97.8)
MONOCYTES: 20 % — ABNORMAL HIGH (ref 3.2–9.0)
MPV: 10.6 FL (ref 9.3–12.9)
NEUTROPHILS: 48 % (ref 47.0–74.6)
PLATELET: 105 10*3/uL — ABNORMAL LOW (ref 140–440)
RBC: 2.97 M/uL — ABNORMAL LOW (ref 3.86–5.18)
RDW: 13.9 % (ref 11.9–14.6)
WBC: 3.2 10*3/uL — ABNORMAL LOW (ref 4.5–10.5)

## 2007-01-22 LAB — PROTHROMBIN TIME + INR
INR: 1.9 — ABNORMAL HIGH (ref 0.9–1.2)
Prothrombin time: 18.6 SECS — ABNORMAL HIGH (ref 9.5–11.1)

## 2007-01-23 LAB — PROTHROMBIN TIME + INR
INR: 2.3 — ABNORMAL HIGH (ref 0.9–1.2)
Prothrombin time: 22.1 SECS — ABNORMAL HIGH (ref 9.5–11.1)

## 2007-01-24 LAB — PROTHROMBIN TIME + INR
INR: 2.3 — ABNORMAL HIGH (ref 0.9–1.2)
Prothrombin time: 22 SECS — ABNORMAL HIGH (ref 9.5–11.1)

## 2007-01-25 LAB — METABOLIC PANEL, BASIC
Anion gap: 8 (ref 7–16)
BUN: 7 MG/DL (ref 7–18)
CO2: 30 MMOL/L (ref 21–32)
Calcium: 9.2 MG/DL (ref 8.4–10.4)
Chloride: 104 MMOL/L (ref 98–107)
Creatinine: 0.8 MG/DL (ref 0.6–1.0)
GFR est AA: >60 mL/min/{1.73_m2} (ref >60)
GFR est non-AA: >60 mL/min/{1.73_m2} (ref >60)
Glucose: 95 MG/DL (ref 74–106)
Potassium: 3.6 MMOL/L (ref 3.5–5.1)
Sodium: 142 MMOL/L (ref 136–145)

## 2007-01-25 LAB — CBC WITH AUTOMATED DIFF
ABS. IMM. GRANS.: 0 10*3/uL (ref 0.0–2.0)
ABS. LYMPHOCYTES: 1 10*3/uL (ref 0.6–4.3)
ABS. MONOCYTES: 0.5 10*3/uL (ref 0.1–0.9)
ABS. NEUTROPHILS: 2.8 10*3/uL (ref 1.9–7.8)
BASOPHILS: 0 % — ABNORMAL LOW (ref 0.1–1.6)
EOSINOPHILS: 4 % (ref 0.5–7.8)
HCT: 27.7 % — ABNORMAL LOW (ref 35.6–45.0)
HGB: 9.1 g/dL — ABNORMAL LOW (ref 11.7–15.0)
LYMPHOCYTES: 22 % (ref 14.7–41.3)
MCH: 29.6 PG (ref 26.1–32.9)
MCHC: 32.9 g/dL (ref 31.4–35.0)
MCV: 90.2 FL (ref 79.6–97.8)
MONOCYTES: 12 % — ABNORMAL HIGH (ref 3.2–9.0)
MPV: 11.3 FL (ref 9.3–12.9)
NEUTROPHILS: 62 % (ref 47.0–74.6)
PLATELET: 101 10*3/uL — ABNORMAL LOW (ref 140–440)
RBC: 3.07 M/uL — ABNORMAL LOW (ref 3.86–5.18)
RDW: 14.2 % (ref 11.9–14.6)
WBC: 4.5 10*3/uL (ref 4.5–10.5)

## 2007-01-25 LAB — CK: CK: 9 U/L — ABNORMAL LOW (ref 21–215)

## 2007-01-25 LAB — PROTHROMBIN TIME + INR
INR: 2.5 — ABNORMAL HIGH (ref 0.9–1.2)
Prothrombin time: 23.4 SECS — ABNORMAL HIGH (ref 9.5–11.1)

## 2007-01-26 LAB — CULTURE, BLOOD/ARD
Culture result:: NO GROWTH
Report Status: 12162008

## 2007-01-26 LAB — PROTHROMBIN TIME + INR
INR: 3.6 — CR (ref 0.9–1.2)
Prothrombin time: 31.8 SECS — ABNORMAL HIGH (ref 9.5–11.1)

## 2007-01-27 LAB — PROTHROMBIN TIME + INR
INR: 3.9 — CR (ref 0.9–1.2)
Prothrombin time: 35.5 SECS — ABNORMAL HIGH (ref 9.5–11.1)

## 2007-01-27 LAB — CBC WITH AUTOMATED DIFF
ABS. IMM. GRANS.: 0 10*3/uL (ref 0.0–2.0)
ABS. LYMPHOCYTES: 0.8 10*3/uL (ref 0.6–4.3)
ABS. MONOCYTES: 0.3 10*3/uL (ref 0.1–0.9)
ABS. NEUTROPHILS: 4.4 10*3/uL (ref 1.9–7.8)
BASOPHILS: 0 % — ABNORMAL LOW (ref 0.1–1.6)
EOSINOPHILS: 4 % (ref 0.5–7.8)
HCT: 28.6 % — ABNORMAL LOW (ref 35.6–45.0)
HGB: 9.4 g/dL — ABNORMAL LOW (ref 11.7–15.0)
LYMPHOCYTES: 14 % — ABNORMAL LOW (ref 14.7–41.3)
MCH: 29.8 PG (ref 26.1–32.9)
MCHC: 32.9 g/dL (ref 31.4–35.0)
MCV: 90.8 FL (ref 79.6–97.8)
MONOCYTES: 5 % (ref 3.2–9.0)
MPV: 11.1 FL (ref 9.3–12.9)
NEUTROPHILS: 77 % — ABNORMAL HIGH (ref 47.0–74.6)
PLATELET: 99 10*3/uL — ABNORMAL LOW (ref 140–440)
RBC: 3.15 M/uL — ABNORMAL LOW (ref 3.86–5.18)
RDW: 14.1 % (ref 11.9–14.6)
WBC: 5.7 10*3/uL (ref 4.5–10.5)

## 2007-01-28 LAB — PROTHROMBIN TIME + INR
INR: 4.1 — CR (ref 0.9–1.2)
Prothrombin time: 36.7 SECS — ABNORMAL HIGH (ref 9.5–11.1)

## 2007-01-29 LAB — CBC WITH AUTOMATED DIFF
ABS. IMM. GRANS.: 0 10*3/uL (ref 0.0–2.0)
ABS. LYMPHOCYTES: 1 10*3/uL (ref 0.6–4.3)
ABS. MONOCYTES: 0.3 10*3/uL (ref 0.1–0.9)
ABS. NEUTROPHILS: 1.4 10*3/uL — ABNORMAL LOW (ref 1.9–7.8)
BASOPHILS: 0 % — ABNORMAL LOW (ref 0.1–1.6)
EOSINOPHILS: 6 % (ref 0.5–7.8)
HCT: 27 % — ABNORMAL LOW (ref 35.6–45.0)
HGB: 8.8 g/dL — ABNORMAL LOW (ref 11.7–15.0)
LYMPHOCYTES: 37 % (ref 14.7–41.3)
MCH: 29.4 PG (ref 26.1–32.9)
MCHC: 32.6 g/dL (ref 31.4–35.0)
MCV: 90.3 FL (ref 79.6–97.8)
MONOCYTES: 9 % (ref 3.2–9.0)
MPV: 11.1 FL (ref 9.3–12.9)
NEUTROPHILS: 48 % (ref 47.0–74.6)
PLATELET: 96 10*3/uL — ABNORMAL LOW (ref 140–440)
RBC: 2.99 M/uL — ABNORMAL LOW (ref 3.86–5.18)
RDW: 14.1 % (ref 11.9–14.6)
WBC: 2.8 10*3/uL — ABNORMAL LOW (ref 4.5–10.5)

## 2007-01-29 LAB — PROTHROMBIN TIME + INR
INR: 1.9 — ABNORMAL HIGH (ref 0.9–1.2)
Prothrombin time: 17.9 SECS — ABNORMAL HIGH (ref 9.5–11.1)

## 2007-01-30 LAB — PROTHROMBIN TIME + INR
INR: 1.2 (ref 0.9–1.2)
Prothrombin time: 12 SECS — ABNORMAL HIGH (ref 9.5–11.1)

## 2007-01-31 LAB — PROTHROMBIN TIME + INR
INR: 1.3 — ABNORMAL HIGH (ref 0.9–1.2)
Prothrombin time: 12.5 SECS — ABNORMAL HIGH (ref 9.5–11.1)

## 2007-02-01 LAB — METABOLIC PANEL, COMPREHENSIVE
A-G Ratio: 1 — ABNORMAL LOW (ref 1.2–3.5)
ALT (SGPT): 19 U/L — ABNORMAL LOW (ref 30–65)
AST (SGOT): 19 U/L (ref 15–37)
Albumin: 3.3 GM/DL — ABNORMAL LOW (ref 3.5–5.0)
Alk. phosphatase: 134 U/L (ref 50–136)
Anion gap: 6 — ABNORMAL LOW (ref 7–16)
BUN: 7 MG/DL (ref 7–18)
Bilirubin, total: 0.2 MG/DL (ref 0.2–1.1)
CO2: 32 MMOL/L (ref 21–32)
Calcium: 9.1 MG/DL (ref 8.4–10.4)
Chloride: 106 MMOL/L (ref 98–107)
Creatinine: 0.8 MG/DL (ref 0.6–1.0)
GFR est AA: >60 mL/min/{1.73_m2} (ref >60)
GFR est non-AA: >60 mL/min/{1.73_m2} (ref >60)
Globulin: 3.4 GM/DL (ref 2.3–3.5)
Glucose: 83 MG/DL (ref 74–106)
Potassium: 4.1 MMOL/L (ref 3.5–5.1)
Protein, total: 6.7 GM/DL (ref 6.3–8.2)
Sodium: 144 MMOL/L (ref 136–145)

## 2007-02-01 LAB — CBC WITH AUTOMATED DIFF
ABS. IMM. GRANS.: 0 10*3/uL (ref 0.0–2.0)
ABS. LYMPHOCYTES: 1.5 10*3/uL (ref 0.6–4.3)
ABS. MONOCYTES: 0.4 10*3/uL (ref 0.1–0.9)
ABS. NEUTROPHILS: 1.4 10*3/uL — ABNORMAL LOW (ref 1.9–7.8)
BASOPHILS: 1 % (ref 0.1–1.6)
EOSINOPHILS: 6 % (ref 0.5–7.8)
HCT: 26.7 % — ABNORMAL LOW (ref 35.6–45.0)
HGB: 8.8 g/dL — ABNORMAL LOW (ref 11.7–15.0)
LYMPHOCYTES: 41 % (ref 14.7–41.3)
MCH: 29.5 PG (ref 26.1–32.9)
MCHC: 33 g/dL (ref 31.4–35.0)
MCV: 89.6 FL (ref 79.6–97.8)
MONOCYTES: 12 % — ABNORMAL HIGH (ref 3.2–9.0)
MPV: 11 FL (ref 9.3–12.9)
NEUTROPHILS: 40 % — ABNORMAL LOW (ref 47.0–74.6)
PLATELET: 106 10*3/uL — ABNORMAL LOW (ref 140–440)
RBC: 2.98 M/uL — ABNORMAL LOW (ref 3.86–5.18)
RDW: 13.7 % (ref 11.9–14.6)
WBC: 3.6 10*3/uL — ABNORMAL LOW (ref 4.5–10.5)

## 2007-02-01 LAB — PROTHROMBIN TIME + INR
INR: 1.4 — ABNORMAL HIGH (ref 0.9–1.2)
Prothrombin time: 14.1 SECS — ABNORMAL HIGH (ref 9.5–11.1)

## 2007-02-01 LAB — CK: CK: 20 U/L — ABNORMAL LOW (ref 21–215)

## 2007-02-01 LAB — MAGNESIUM: Magnesium: 2 MG/DL (ref 1.8–2.4)

## 2007-02-05 DIAGNOSIS — R509 Fever, unspecified: Secondary | ICD-10-CM

## 2007-02-05 LAB — CBC WITH AUTOMATED DIFF
ABS. IMM. GRANS.: 0 10*3/uL (ref 0.0–2.0)
ABS. LYMPHOCYTES: 0.2 10*3/uL — ABNORMAL LOW (ref 0.6–4.3)
ABS. MONOCYTES: 0.5 10*3/uL (ref 0.1–0.9)
ABS. NEUTROPHILS: 5.3 10*3/uL (ref 1.9–7.8)
BASOPHILS: 0 % — ABNORMAL LOW (ref 0.1–1.6)
EOSINOPHILS: 0 % — ABNORMAL LOW (ref 0.5–7.8)
HCT: 28.8 % — ABNORMAL LOW (ref 35.6–45.0)
HGB: 9.4 g/dL — ABNORMAL LOW (ref 11.7–15.0)
LYMPHOCYTES: 4 % — ABNORMAL LOW (ref 14.7–41.3)
MCH: 29.8 PG (ref 26.1–32.9)
MCHC: 32.6 g/dL (ref 31.4–35.0)
MCV: 91.4 FL (ref 79.6–97.8)
MONOCYTES: 8 % (ref 3.2–9.0)
MPV: 11.1 FL (ref 9.3–12.9)
NEUTROPHILS: 88 % — ABNORMAL HIGH (ref 47.0–74.6)
PLATELET: 130 10*3/uL — ABNORMAL LOW (ref 140–440)
RBC: 3.15 M/uL — ABNORMAL LOW (ref 3.86–5.18)
RDW: 15.7 % — ABNORMAL HIGH (ref 11.9–14.6)
WBC: 6 10*3/uL (ref 4.5–10.5)

## 2007-02-05 MED ORDER — ONDANSETRON (PF) 4 MG/2 ML INJECTION
4 mg/2 mL | INTRAMUSCULAR | Status: AC
Start: 2007-02-05 — End: 2007-02-05
  Administered 2007-02-05: 23:00:00 via INTRAVENOUS

## 2007-02-05 MED ORDER — HYDROMORPHONE 2 MG/ML INJECTION SOLUTION
2 mg/mL | INTRAMUSCULAR | Status: AC
Start: 2007-02-05 — End: 2007-02-05
  Administered 2007-02-05: 23:00:00 via INTRAVENOUS

## 2007-02-05 MED ORDER — SODIUM CHLORIDE 0.9 % IV
INTRAVENOUS | Status: DC
Start: 2007-02-05 — End: 2007-02-09

## 2007-02-05 MED ADMIN — acetaminophen (TYLENOL) 500 mg tablet: @ 23:00:00 | NDC 96295012957

## 2007-02-05 MED FILL — ACETAMINOPHEN 500 MG TAB: 500 mg | ORAL | Qty: 2

## 2007-02-05 MED FILL — ONDANSETRON (PF) 4 MG/2 ML INJECTION: 4 mg/2 mL | INTRAMUSCULAR | Qty: 2

## 2007-02-05 MED FILL — HYDROMORPHONE 2 MG/ML INJECTION SOLUTION: 2 mg/mL | INTRAMUSCULAR | Qty: 1

## 2007-02-05 NOTE — ED Notes (Signed)
Implanted port in RACW accessed with 20g huber needle. Labs collected and site flushed easily with 5 mls NS. Medicated with tylenol for temp 103.1, also medicated with dilaudid and zofran. NS bolus begun

## 2007-02-05 NOTE — ED Notes (Signed)
2nd set of blood cultures obtained by butterfly. Bleeding controlled and dressing applied.

## 2007-02-05 NOTE — Progress Notes (Signed)
TRANSFER - OUT REPORT:    Verbal report given to Joy, RN on Krista Lopez  being transferred to 6th floor for routine progression of care       Report consisted of patient???s Situation, Background, Assessment and   Recommendations(SBAR).     Information from the following report(s) ED Summary was reveiwed with the receiving nurse.    Opportunity for questions and clarification was provided.

## 2007-02-05 NOTE — ED Notes (Signed)
Report from Philicia, Charity fundraiser. Pt in xray at present time.

## 2007-02-05 NOTE — ED Notes (Signed)
Results for orders placed during the hospital encounter of 02/05/2007   METABOLIC PANEL, COMPREHENSIVE   Component Value Range   ??? SODIUM 137  136-145 (MMOL/L)   ??? POTASSIUM 3.3 (*) 3.5-5.1 (MMOL/L)   ??? CHLORIDE 102  98-107 (MMOL/L)   ??? CO2 22  21-32 (MMOL/L)   ??? ANION GAP 13  7-16    ??? GLUCOSE 109 (*) 74-106 (MG/DL)   ??? BUN 12  1-61 (MG/DL)   ??? CREATININE 0.9  0.6-1.0 (MG/DL)   ??? GFR est AA >60  - (ml/min/1.22m2)   ??? GFR est non-AA >60  - (ml/min/1.24m2)   ??? CALCIUM 9.1  8.4-10.4 (MG/DL)   ??? BILIRUBIN,TOTAL 0.7  0.2-1.1 (MG/DL)   ??? ALT/SGPT 37  30-65 (U/L)   ??? AST/SGOT 26  15-37 (U/L)   ??? ALK PHOS 154 (*) 50-136 (U/L)   ??? PROTEIN,TOTAL 7.4  6.3-8.2 (GM/DL)   ??? ALBUMIN 3.9  3.5-5.0 (GM/DL)   ??? GLOBULIN 3.5  2.3-3.5 (GM/DL)   ??? A-Ellajane Stong RATIO 1.1 (*) 1.2-3.5 ( )   LACTIC ACID, PLASMA   Component Value Range   ??? LACTIC ACID, PLASMA 2.6 (*) 0.4-2.0 (MMOL/L)   CBC W/ AUTOMATED DIFF   Component Value Range   ??? WBC 6.0  4.5-10.5 (K/uL)   ??? RBC 3.15 (*) 3.86-5.18 (M/uL)   ??? Hemoglobin 9.4 (*) 11.7-15.0 (Garry Nicolini/dL)   ??? Hematocrit 28.8 (*) 35.6-45.0 (%)   ??? MCV 91.4  79.6-97.8 (FL)   ??? MCH 29.8  26.1-32.9 (PG)   ??? MCHC 32.6  31.4-35.0 (Samiah Ricklefs/dL)   ??? RDW 15.7 (*) 11.9-14.6 (%)   ??? Platelets 130 (*) 140-440 (K/uL)   ??? MPV 11.1  9.3-12.9 (FL)   ??? DF AUTOMATED DIFF  -    ??? NEUTROPHILS 88 (*) 47.0-74.6 (%)   ??? LYMPHOCYTES 4 (*) 14.7-41.3 (%)   ??? MONOCYTES 8  3.2-9.0 (%)   ??? EOSINOPHILS 0 (*) 0.5-7.8 (%)   ??? BASOPHILS 0 (*) 0.1-1.6 (%)   ??? ABSOLUTE NEUTROPHILS 5.3  1.9-7.8 (K/UL)   ??? ABSOLUTE LYMPHS 0.2 (*) 0.6-4.3 (K/UL)   ??? ABSOLUTE MONOS 0.5  0.1-0.9 (K/UL)   ??? ABS. IMM. GRANS. 0.0  0.0-2.0 (K/UL)   PTT   Component Value Range   ??? APTT 77.2 (*) 23.5-31.7 (SEC)   INFLUENZA A & B ANTI   Component Value Range   ??? Influenza A Ag NEGATIVE   -    ??? Influenza B Ag NEGATIVE   -    ??? H Influenza Control OK  -        CXR is neg

## 2007-02-05 NOTE — ED Notes (Signed)
Moved to room 5 for continued care. Temp rechecked = 103.1 orally.

## 2007-02-05 NOTE — ED Notes (Signed)
When pt returned from xray, pt placed in room 5 and Enid Derry, Midwife.

## 2007-02-05 NOTE — H&P (Unsigned)
[facility]   [address]   [place], [place]. [address]   [phone]     HISTORY AND PHYSICAL    NAME: Krista Lopez, Krista Lopez MR: [id number]  LOC: ER SEX: F ACCT: [id number]  DOB: 10/18/1965 AGE: 41 PT: E  ADMIT: 02/05/2007 DSCH: MSV: EMR    PRIMARY CARE PHYSICIAN: None.    CHIEF COMPLAINT: Fevers.    HISTORY OF PRESENT ILLNESS: This is a 41 year old white female who was  originally seen apparently at the end of November or the beginning of  December at a hospital in [place] with shortness of breath. Up there she  was diagnosed with recurrent pulmonary embolism and she was placed on  appropriate treatment for this. During that hospitalization she  developed fevers and MRSA bacteremia. She was transferred to [facility] for long-term antibiotics. While there she did have blood  cultures that grew out MRSA 2/2. She continued to have off and on fevers  there. She did have an echocardiogram that showed no vegetations. It  appears that there is no underlying ________ of infection. She did have  repeat blood cultures that were negative. Despite this apparently the  patient continued to run fevers. Four days ago, on the Monday before  Christmas, the patient left [facility] AMA because she wanted to be home  with her family for the holidays. She returns with fevers. She states  while in [place] she did experience diarrhea but this has been improving.  She denies any cough or sputum production. She denies any sick contacts.  She denies any vaginal discharge. She denies any abdominal pain. She  denies any dysuria or hematuria. Here in the emergency room at [facility] she was found to have a temperature of 103. We have been called  to evaluate the patient for admission.    REVIEW OF SYSTEMS: A 10-point review of systems was performed and is  negative except for as above.    PAST MEDICAL HISTORY:   1. History of PE.   2. Factor V Leiden.   3. History of nephrolithiasis.   4. History of MRSA bacteremia.    5. History of migraines.   6. Neuropathy.   7. Bipolar disorder.   8. Pain medication seeking behavior.    PAST SURGICAL HISTORY:   1. Cystoscopy and lithotripsy.   2. Appendectomy.   3. C-section and tubal ligation.   4. Tonsillectomy.   5. Port placement.   6. IVC filter placement.    SOCIAL HISTORY: The patient denies any tobacco, alcohol or drugs. She  is currently undergoing a divorce. Her son is present during the  evaluation. She is currently dating another gentleman. She is on  disability.    FAMILY HISTORY: Positive for cancer in multiple relatives.    MEDICATIONS:   1. Ambien CR 12.5 mg nightly.   2. Coumadin 5 mg at bedtime.   3. Cymbalta 60 mg daily.   4. Klonopin 1 mg 2 times a day.   5. Lovenox 100 mg subcu every other day.   6. Phenergan 25 mg as needed.    ALLERGIES:   1. CODEINE.   2. PENICILLIN.   3. PREDNISONE.   4. KETOROLAC.   5. SHELLFISH.   6. IV DYE.   7. INFLUENZA VACCINE.    PHYSICAL EXAMINATION:  VITAL SIGNS: Blood pressure 133/90, pulse initially was 155,  respirations were 28. This has currently improved to a pulse in the 100  range and respirations are  18. Temperature initially was 98.3 up to  103.1, currently appears afebrile. Oxygen saturation is 95%. In general  this is a thin, pale appearing white female in no acute distress. HEENT:  Normocephalic, atraumatic. Pupils equal, round, reactive to light.  Extraocular movements intact. No scleral icterus. No scleral injection.  Oral mucosa without lesion or exudate. There is no supraclavicular or  cervical lymphadenopathy. Trachea is midline.  CARDIOVASCULAR: She is very slightly tachycardic. She does have a  systolic ejection murmur best heard in the left sternal border. There is  no lower extremity edema. There is no jugular venous distention. Pulses  in all extremities are palpable and equal.  PULMONARY: Clear to auscultation bilaterally without wheezes, rales,  rhonchi.   ABDOMEN: Soft, nontender, nondistended. Normoactive bowel sounds. No  hepatosplenomegaly is appreciated.  MUSCULOSKELETAL: She is moving all extremities with 5/5 strength. No  obvious joint deformities noted.  NEUROLOGIC: Cranial nerves II-XII grossly intact. Sensation is intact.    SKIN: No rashes, lesions or ulcerations are noted. Port site was  inspected, this appears to be non-inflamed.  PSYCHIATRIC: The patient is alert and oriented x3 with appropriate mood  and affect.    LABORATORY DATA: White blood cell count 6000, hemoglobin is 9.4,  hematocrit is 28.8, platelets are 130,000. PTT 77.2. Sodium is 133,  potassium 3.3, chloride 102, bicarbonate 22, BUN is 12, creatinine is  0.9, glucose is 109, calcium is 9.1, total protein 7.4, albumin 3.9,  globulin 3.5, total bilirubin 0.7, alkaline phosphatase ________ , AST  26, ALT 37, lipase 2.6. Influenza swab for A and B was negative. Chest  x-ray has been done and reviewed by me and was negative for any acute  process.    ASSESSMENT AND PLAN:   1. Febrile illness. There is no clear source for this. Blood   cultures have been done x1 here in the emergency room. We will get a   second set. We will also get a urinalysis with micro and culture   sensitivity. She does have a murmur. She did have a recent  echocardiogram on 12/10 that showed no vegetations. If she continues to  be febrile will consider rechecking this. We will place her on  vancomycin empirically with her history of MRSA bacteremia.   2. Hypokalemia, repeat.   3. Anemia. This appears to be chronic and stable by our records.   4. Thrombocytopenia, again this appears to be chronic and stable by   our records.   5. Factor V Leiden with multiple PE. Will continue her home   medications and try to titrate her Coumadin to a therapeutic INR.                     __________________________________   Dedra Skeens, MD A           This is an unverified document unless signed by physician.     TID: [id number] DT: 02/05/2007 8:53 P  JOB: [id number] DOC#: [id number] DD: 02/05/2007     cc: Dedra Skeens, MD

## 2007-02-05 NOTE — ED Provider Notes (Signed)
HPI Comments: Krista Lopez is a 41 y.o. Caucasian female in NAD presents with c/o Fever and chills onset yesterday. Pt states that she was in the hospital with a "Blood Infection" and signed out [facility] Monday.      Fever   [name] This is a recurrent problem. The current episode started yesterday. The problem has been gradually worsening. The maximum temperature noted was 102 - 102.9 F. The temperature was taken using an oral thermometer. Associated symptoms include muscle aches. Pertinent negatives include no vomiting. She has tried nothing for the symptoms.        Past Medical History   Diagnosis Date   ??? Pulmonary Embolism    ??? Factor V (Leiden) Deficiency    ??? Kidney Calculi    ??? Thromboembolus      pulmonary emboli x 15   ??? Cholecystitis with Cholelithiasis    ??? Chronic Kidney Disease      Renal stones   ??? Sleep Disorder      problems sleeping   ??? Neurological Disorder      hx migraines   ??? Other Ill-Defined Conditions      port right subclavian, hx kidney stones          Past Surgical History   Procedure Date   ??? Cystoscopy    ??? Lithotripsy    ??? Hx appendectomy    ??? Hx gyn      cysts from ovaries, c-section, tubal ligation   ??? Hx tubal ligation    ??? Hx tubal ligation    ??? Hx urological      for stones   ??? Hx heent    ??? Hx tonsillectomy    ??? Hx other surgical            No family history on file.     History   Social History   ??? Marital Status: Legally Separated     Spouse Name: N/A     Number of Children: N/A   ??? Years of Education: N/A   Occupational History   ??? Not on file.   Social History Main Topics   ??? Tobacco Use: Never   ??? Alcohol Use: No   ??? Drug Use: No   ??? Sexually Active: Yes -- Female partner(s)     Birth Control/ Protection: Surgical   Other Topics Concern   ??? Not on file   Social History Narrative   ??? No narrative on file           ALLERGIES: Codeine, Pcn, Prednisone, Ketorolac tromethamine, Shellfish, Iv dye, iodine containing and Influenza virus vaccine       Review of Systems   Constitutional: Positive for fever and chills.   Gastrointestinal: Negative for nausea and vomiting.   Musculoskeletal: Positive for myalgias.   Skin: Positive for itching.   All other systems reviewed and are negative.      Filed Vitals:    02/05/2007  4:14 PM 02/05/2007  4:17 PM   BP: 133/90    Pulse: 155    Temp: 98.3 ??F (36.8 ??C)    Resp: 28    Height: 5\' 4"  (1.626 m)    Weight: 134 lb (60.782 kg)    SpO2:  95%              Physical Exam   Nursing note and vitals reviewed.  Constitutional: She is oriented. She appears well-developed and well-nourished. She appears ill. No  distress.   HENT:   Head: Normocephalic and atraumatic.   Right Ear: External ear normal.   Left Ear: External ear normal.   Nose: Nose normal.   Mouth/Throat: Oropharynx is clear and moist.   Eyes: Conjunctivae and extraocular motions are normal. No scleral icterus.   Neck: Normal range of motion. Neck supple.   Cardiovascular: Regular rhythm and intact distal pulses.  Tachycardia present.    Pulmonary/Chest: Breath sounds normal. Tachypnea noted. No respiratory distress.   Musculoskeletal: Normal range of motion. She exhibits no tenderness.   Neurological: She is alert and oriented.   Skin: Skin is warm and dry. No rash noted.   Psychiatric: Her mood appears anxious. Her speech is rapid and/or pressured. She is agitated.

## 2007-02-05 NOTE — ED Notes (Signed)
nares swabbed for flu. Sent to lab

## 2007-02-06 ENCOUNTER — Inpatient Hospital Stay
Admission: EM | Admit: 2007-02-06 | Discharge: 2007-02-10 | Disposition: A | Discharge status: Home / Self Care | Source: Emergency Department | Attending: Internal Medicine | Admitting: Internal Medicine

## 2007-02-06 LAB — METABOLIC PANEL, COMPREHENSIVE
A-G Ratio: 1.1 — ABNORMAL LOW (ref 1.2–3.5)
ALT (SGPT): 37 U/L (ref 30–65)
AST (SGOT): 26 U/L (ref 15–37)
Albumin: 3.9 GM/DL (ref 3.5–5.0)
Alk. phosphatase: 154 U/L — ABNORMAL HIGH (ref 50–136)
Anion gap: 13 (ref 7–16)
BUN: 12 MG/DL (ref 7–18)
Bilirubin, total: 0.7 MG/DL (ref 0.2–1.1)
CO2: 22 MMOL/L (ref 21–32)
Calcium: 9.1 MG/DL (ref 8.4–10.4)
Chloride: 102 MMOL/L (ref 98–107)
Creatinine: 0.9 MG/DL (ref 0.6–1.0)
GFR est AA: >60 mL/min/{1.73_m2} (ref >60)
GFR est non-AA: >60 mL/min/{1.73_m2} (ref >60)
Globulin: 3.5 GM/DL (ref 2.3–3.5)
Glucose: 109 MG/DL — ABNORMAL HIGH (ref 74–106)
Potassium: 3.3 MMOL/L — ABNORMAL LOW (ref 3.5–5.1)
Protein, total: 7.4 GM/DL (ref 6.3–8.2)
Sodium: 137 MMOL/L (ref 136–145)

## 2007-02-06 LAB — PROTHROMBIN TIME + INR
INR: 1.4 — ABNORMAL HIGH (ref 0.9–1.2)
Prothrombin time: 13.4 SECS — ABNORMAL HIGH (ref 9.5–11.1)

## 2007-02-06 LAB — LACTIC ACID: Lactic acid: 2.6 MMOL/L — ABNORMAL HIGH (ref 0.4–2.0)

## 2007-02-06 LAB — METABOLIC PANEL, BASIC
Anion gap: 10 (ref 7–16)
BUN: 11 MG/DL (ref 7–18)
CO2: 23 MMOL/L (ref 21–32)
Calcium: 8.1 MG/DL — ABNORMAL LOW (ref 8.4–10.4)
Chloride: 107 MMOL/L (ref 98–107)
Creatinine: 1 MG/DL (ref 0.6–1.0)
GFR est AA: >60 mL/min/{1.73_m2} (ref >60)
GFR est non-AA: >60 mL/min/{1.73_m2} (ref >60)
Glucose: 92 MG/DL (ref 74–106)
Potassium: 4.1 MMOL/L (ref 3.5–5.1)
Sodium: 140 MMOL/L (ref 136–145)

## 2007-02-06 LAB — URINE MICROSCOPIC
Casts: 0 /LPF
Crystals, urine: 0 /LPF
Mucus: 0 /LPF
RBC: 0 /HPF

## 2007-02-06 LAB — DRUG SCREEN, URINE
ACETAMINOPHEN: POSITIVE
AMPHETAMINES: NEGATIVE
BARBITURATES: NEGATIVE
BENZODIAZEPINES: NEGATIVE
COCAINE: NEGATIVE
METHADONE: NEGATIVE
Methamphetamines: NEGATIVE
OPIATES: NEGATIVE
PCP(PHENCYCLIDINE): NEGATIVE
THC (TH-CANNABINOL): NEGATIVE
TRICYCLICS: POSITIVE

## 2007-02-06 LAB — INFLUENZA A & B AG (RAPID TEST)
Influenza A Ag: NEGATIVE
Influenza B Ag: NEGATIVE

## 2007-02-06 LAB — CBC W/O DIFF
HCT: 25.2 % — ABNORMAL LOW (ref 35.6–45.0)
HGB: 8 g/dL — ABNORMAL LOW (ref 11.7–15.0)
MCH: 29.7 PG (ref 26.1–32.9)
MCHC: 31.7 g/dL (ref 31.4–35.0)
MCV: 93.7 FL (ref 79.6–97.8)
MPV: 12.6 FL (ref 9.3–12.9)
PLATELET: 138 10*3/uL — ABNORMAL LOW (ref 140–440)
RBC: 2.69 M/uL — ABNORMAL LOW (ref 3.86–5.18)
RDW: 15.9 % — ABNORMAL HIGH (ref 11.9–14.6)
WBC: 8.4 10*3/uL (ref 4.5–10.5)

## 2007-02-06 LAB — MAGNESIUM: Magnesium: 1.6 MG/DL — ABNORMAL LOW (ref 1.8–2.4)

## 2007-02-06 LAB — PTT: aPTT: 77.2 s — ABNORMAL HIGH (ref 23.5–31.7)

## 2007-02-06 MED ORDER — SALINE PERIPHERAL FLUSH Q8H
Freq: Three times a day (TID) | INTRAMUSCULAR | Status: DC
Start: 2007-02-06 — End: 2007-02-10
  Administered 2007-02-06 – 2007-02-10 (×13)

## 2007-02-06 MED ORDER — HYDROMORPHONE 2 MG/ML INJECTION SOLUTION
2 mg/mL | INTRAMUSCULAR | Status: DC | PRN
Start: 2007-02-06 — End: 2007-02-08
  Administered 2007-02-06 – 2007-02-08 (×9): via INTRAVENOUS

## 2007-02-06 MED ORDER — ZOLPIDEM 10 MG TAB
10 mg | Freq: Every evening | ORAL | Status: DC | PRN
Start: 2007-02-06 — End: 2007-02-10
  Administered 2007-02-06 – 2007-02-10 (×5): via ORAL

## 2007-02-06 MED ORDER — CLONAZEPAM 1 MG TAB
1 mg | Freq: Three times a day (TID) | ORAL | Status: DC
Start: 2007-02-06 — End: 2007-02-10
  Administered 2007-02-06 – 2007-02-07 (×5): via ORAL
  Administered 2007-02-07: 03:00:00
  Administered 2007-02-07 – 2007-02-10 (×9): via ORAL

## 2007-02-06 MED ORDER — POTASSIUM CHLORIDE SR 20 MEQ TAB, PARTICLES/CRYSTALS
20 mEq | ORAL | Status: AC
Start: 2007-02-06 — End: 2007-02-05
  Administered 2007-02-06: 03:00:00 via ORAL

## 2007-02-06 MED ORDER — DIPHENHYDRAMINE HCL 50 MG/ML IJ SOLN
50 mg/mL | INTRAMUSCULAR | Status: DC
Start: 2007-02-06 — End: 2007-02-08
  Administered 2007-02-06: 19:00:00 via INTRAVENOUS
  Administered 2007-02-06: 21:00:00
  Administered 2007-02-07 – 2007-02-08 (×11): via INTRAVENOUS

## 2007-02-06 MED ORDER — WARFARIN 5 MG TAB
5 mg | Freq: Every evening | ORAL | Status: DC
Start: 2007-02-06 — End: 2007-02-08
  Administered 2007-02-06 – 2007-02-07 (×2): via ORAL
  Administered 2007-02-07: 03:00:00
  Administered 2007-02-08: 03:00:00 via ORAL

## 2007-02-06 MED ORDER — PROMETHAZINE 25 MG RECTAL SUPPOSITORY
25 mg | RECTAL | Status: DC | PRN
Start: 2007-02-06 — End: 2007-02-10

## 2007-02-06 MED ORDER — HEPARIN, PORCINE (PF) 100 UNIT/ML IV SYRINGE
100 unit/mL | INTRAVENOUS | Status: DC | PRN
Start: 2007-02-06 — End: 2007-02-10
  Administered 2007-02-08: 11:00:00

## 2007-02-06 MED ORDER — DULOXETINE 60 MG CAP, DELAYED RELEASE
60 mg | Freq: Every day | ORAL | Status: DC
Start: 2007-02-06 — End: 2007-02-10
  Administered 2007-02-06 – 2007-02-10 (×5): via ORAL

## 2007-02-06 MED ORDER — ENOXAPARIN 100 MG/ML SUB-Q SYRINGE
100 mg/mL | SUBCUTANEOUS | Status: DC
Start: 2007-02-06 — End: 2007-02-08
  Administered 2007-02-06: 03:00:00 via SUBCUTANEOUS

## 2007-02-06 MED ORDER — OXYCODONE 5 MG TAB
5 mg | ORAL | Status: DC | PRN
Start: 2007-02-06 — End: 2007-02-09
  Administered 2007-02-06 – 2007-02-09 (×8): via ORAL

## 2007-02-06 MED ORDER — SALINE PERIPHERAL FLUSH PRN
INTRAMUSCULAR | Status: DC | PRN
Start: 2007-02-06 — End: 2007-02-10
  Administered 2007-02-08 (×2)

## 2007-02-06 MED ORDER — MAGNESIUM SULFATE IN D5W 1 GRAM/100 ML IV PIGGY BACK
1 gram/00 mL | Freq: Once | INTRAVENOUS | Status: AC
Start: 2007-02-06 — End: 2007-02-06
  Administered 2007-02-06: 19:00:00 via INTRAVENOUS

## 2007-02-06 MED ORDER — MORPHINE 2 MG/ML INJECTION
2 mg/mL | INTRAMUSCULAR | Status: DC | PRN
Start: 2007-02-06 — End: 2007-02-06
  Administered 2007-02-06: 19:00:00 via INTRAVENOUS

## 2007-02-06 MED ORDER — SODIUM CHLORIDE 0.9 % IV PIGGY BACK
1000 mg | Freq: Two times a day (BID) | INTRAVENOUS | Status: DC
Start: 2007-02-06 — End: 2007-02-08
  Administered 2007-02-06 – 2007-02-08 (×6): via INTRAVENOUS

## 2007-02-06 MED ORDER — LEVOFLOXACIN IN D5W 750 MG/150 ML IV PIGGY BACK
750 mg/150 mL | INTRAVENOUS | Status: DC
Start: 2007-02-06 — End: 2007-02-08
  Administered 2007-02-06 – 2007-02-07 (×2): via INTRAVENOUS

## 2007-02-06 MED ORDER — HEPARIN, PORCINE (PF) 100 UNIT/ML IV SYRINGE
100 unit/mL | Freq: Three times a day (TID) | INTRAVENOUS | Status: DC
Start: 2007-02-06 — End: 2007-02-10
  Administered 2007-02-06 – 2007-02-10 (×10)

## 2007-02-06 MED ORDER — ACETAMINOPHEN 325 MG TABLET
325 mg | ORAL | Status: DC | PRN
Start: 2007-02-06 — End: 2007-02-10

## 2007-02-06 MED ORDER — ENOXAPARIN 60 MG/0.6 ML SUB-Q SYRINGE
60 mg/0.6 mL | Freq: Two times a day (BID) | SUBCUTANEOUS | Status: DC
Start: 2007-02-06 — End: 2007-02-10
  Administered 2007-02-06 – 2007-02-10 (×9): via SUBCUTANEOUS

## 2007-02-06 MED ORDER — ONDANSETRON (PF) 4 MG/2 ML INJECTION
4 mg/2 mL | Freq: Four times a day (QID) | INTRAMUSCULAR | Status: DC | PRN
Start: 2007-02-06 — End: 2007-02-10
  Administered 2007-02-06 – 2007-02-10 (×12): via INTRAVENOUS

## 2007-02-06 MED FILL — LEVAQUIN 750 MG/150 ML IN 5 % DEXTROSE INTRAVENOUS PIGGYBACK: 750 mg/150 mL | INTRAVENOUS | Qty: 150

## 2007-02-06 MED FILL — LOVENOX 60 MG/0.6 ML SUBCUTANEOUS SYRINGE: 60 mg/0.6 mL | SUBCUTANEOUS | Qty: 0.6

## 2007-02-06 MED FILL — DIPHENHYDRAMINE HCL 50 MG/ML IJ SOLN: 50 mg/mL | INTRAMUSCULAR | Qty: 1

## 2007-02-06 MED FILL — HEPARIN, PORCINE (PF) 100 UNIT/ML IV SYRINGE: 100 unit/mL | INTRAVENOUS | Qty: 5

## 2007-02-06 MED FILL — LOVENOX 100 MG/ML SUBCUTANEOUS SYRINGE: 100 mg/mL | SUBCUTANEOUS | Qty: 1

## 2007-02-06 MED FILL — WARFARIN 5 MG TAB: 5 mg | ORAL | Qty: 1

## 2007-02-06 MED FILL — VANCOMYCIN 1,000 MG IV SOLR: 1000 mg | INTRAVENOUS | Qty: 1000

## 2007-02-06 MED FILL — CLONAZEPAM 1 MG TAB: 1 mg | ORAL | Qty: 1

## 2007-02-06 MED FILL — KLOR-CON M20 MEQ TABLET,EXTENDED RELEASE: 20 mEq | ORAL | Qty: 2

## 2007-02-06 MED FILL — ONDANSETRON (PF) 4 MG/2 ML INJECTION: 4 mg/2 mL | INTRAMUSCULAR | Qty: 2

## 2007-02-06 MED FILL — ZOLPIDEM 10 MG TAB: 10 mg | ORAL | Qty: 1

## 2007-02-06 MED FILL — MAGNESIUM SULFATE IN D5W 1 GRAM/100 ML IV PIGGY BACK: 1 gram/00 mL | INTRAVENOUS | Qty: 100

## 2007-02-06 MED FILL — HYDROMORPHONE 2 MG/ML INJECTION SOLUTION: 2 mg/mL | INTRAMUSCULAR | Qty: 1

## 2007-02-06 MED FILL — OXYCODONE 5 MG TAB: 5 mg | ORAL | Qty: 2

## 2007-02-06 MED FILL — CYMBALTA 60 MG CAPSULE,DELAYED RELEASE: 60 mg | ORAL | Qty: 1

## 2007-02-06 MED FILL — MORPHINE 2 MG/ML INJECTION: 2 mg/mL | INTRAMUSCULAR | Qty: 1

## 2007-02-06 MED FILL — PROMETHEGAN 25 MG RECTAL SUPPOSITORY: 25 mg | RECTAL | Qty: 1

## 2007-02-07 LAB — CBC W/O DIFF
HCT: 26.4 % — ABNORMAL LOW (ref 35.6–45.0)
HGB: 8.5 g/dL — ABNORMAL LOW (ref 11.7–15.0)
MCH: 29.9 PG (ref 26.1–32.9)
MCHC: 32.2 g/dL (ref 31.4–35.0)
MCV: 93 FL (ref 79.6–97.8)
MPV: 12.1 FL (ref 9.3–12.9)
PLATELET: 154 10*3/uL (ref 140–440)
RBC: 2.84 M/uL — ABNORMAL LOW (ref 3.86–5.18)
RDW: 15.4 % — ABNORMAL HIGH (ref 11.9–14.6)
WBC: 4.4 10*3/uL — ABNORMAL LOW (ref 4.5–10.5)

## 2007-02-07 LAB — PROTHROMBIN TIME + INR
INR: 1.3 — ABNORMAL HIGH (ref 0.9–1.2)
Prothrombin time: 12.6 SECS — ABNORMAL HIGH (ref 9.5–11.1)

## 2007-02-07 LAB — METABOLIC PANEL, BASIC
Anion gap: 9 (ref 7–16)
BUN: 5 MG/DL — ABNORMAL LOW (ref 7–18)
CO2: 25 MMOL/L (ref 21–32)
Calcium: 8.9 MG/DL (ref 8.4–10.4)
Chloride: 108 MMOL/L — ABNORMAL HIGH (ref 98–107)
Creatinine: 0.9 MG/DL (ref 0.6–1.0)
GFR est AA: >60 mL/min/{1.73_m2} (ref >60)
GFR est non-AA: >60 mL/min/{1.73_m2} (ref >60)
Glucose: 87 MG/DL (ref 74–106)
Potassium: 4.6 MMOL/L (ref 3.5–5.1)
Sodium: 142 MMOL/L (ref 136–145)

## 2007-02-07 LAB — TRANSFERRIN SATURATION
Iron: 76 ug/dL (ref 35–150)
TIBC: 246 ug/dL — ABNORMAL LOW (ref 250–450)
Transferrin Saturation: 31 % (ref >20)

## 2007-02-07 LAB — MAGNESIUM: Magnesium: 2.2 MG/DL (ref 1.8–2.4)

## 2007-02-07 MED ORDER — SODIUM CHLORIDE 0.9 % IV
Freq: Once | INTRAVENOUS | Status: AC
Start: 2007-02-07 — End: 2007-02-07
  Administered 2007-02-07: 10:00:00 via INTRAVENOUS

## 2007-02-07 MED FILL — VANCOMYCIN 1,000 MG IV SOLR: 1000 mg | INTRAVENOUS | Qty: 1000

## 2007-02-07 MED FILL — WARFARIN 5 MG TAB: 5 mg | ORAL | Qty: 1

## 2007-02-07 MED FILL — LEVAQUIN 750 MG/150 ML IN 5 % DEXTROSE INTRAVENOUS PIGGYBACK: 750 mg/150 mL | INTRAVENOUS | Qty: 150

## 2007-02-07 MED FILL — HEPARIN, PORCINE (PF) 100 UNIT/ML IV SYRINGE: 100 unit/mL | INTRAVENOUS | Qty: 5

## 2007-02-07 MED FILL — HYDROMORPHONE 2 MG/ML INJECTION SOLUTION: 2 mg/mL | INTRAMUSCULAR | Qty: 1

## 2007-02-07 MED FILL — CYMBALTA 60 MG CAPSULE,DELAYED RELEASE: 60 mg | ORAL | Qty: 1

## 2007-02-07 MED FILL — ONDANSETRON (PF) 4 MG/2 ML INJECTION: 4 mg/2 mL | INTRAMUSCULAR | Qty: 2

## 2007-02-07 MED FILL — OXYCODONE 5 MG TAB: 5 mg | ORAL | Qty: 2

## 2007-02-07 MED FILL — CLONAZEPAM 1 MG TAB: 1 mg | ORAL | Qty: 1

## 2007-02-07 MED FILL — DIPHENHYDRAMINE HCL 50 MG/ML IJ SOLN: 50 mg/mL | INTRAMUSCULAR | Qty: 1

## 2007-02-07 MED FILL — LOVENOX 60 MG/0.6 ML SUBCUTANEOUS SYRINGE: 60 mg/0.6 mL | SUBCUTANEOUS | Qty: 0.6

## 2007-02-07 MED FILL — ZOLPIDEM 10 MG TAB: 10 mg | ORAL | Qty: 1

## 2007-02-08 LAB — CULTURE, URINE
Culture result:: NO GROWTH
Culture: NO GROWTH

## 2007-02-08 LAB — METABOLIC PANEL, BASIC
Anion gap: 7 (ref 7–16)
BUN: 8 MG/DL (ref 7–18)
CO2: 28 MMOL/L (ref 21–32)
Calcium: 9 MG/DL (ref 8.4–10.4)
Chloride: 107 MMOL/L (ref 98–107)
Creatinine: 1 MG/DL (ref 0.6–1.0)
GFR est AA: >60 mL/min/{1.73_m2} (ref >60)
GFR est non-AA: >60 mL/min/{1.73_m2} (ref >60)
Glucose: 87 MG/DL (ref 74–106)
Potassium: 4.3 MMOL/L (ref 3.5–5.1)
Sodium: 142 MMOL/L (ref 136–145)

## 2007-02-08 LAB — CBC W/O DIFF
HCT: 27 % — ABNORMAL LOW (ref 35.6–45.0)
HGB: 8.7 g/dL — ABNORMAL LOW (ref 11.7–15.0)
MCH: 29.8 PG (ref 26.1–32.9)
MCHC: 32.2 g/dL (ref 31.4–35.0)
MCV: 92.5 FL (ref 79.6–97.8)
MPV: 10.9 FL (ref 9.3–12.9)
PLATELET: 176 10*3/uL (ref 140–440)
RBC: 2.92 M/uL — ABNORMAL LOW (ref 3.86–5.18)
RDW: 15.1 % — ABNORMAL HIGH (ref 11.9–14.6)
WBC: 2.7 10*3/uL — ABNORMAL LOW (ref 4.5–10.5)

## 2007-02-08 LAB — CULTURE, MRSA: Culture result:: NOT DETECTED

## 2007-02-08 LAB — FERRITIN: Ferritin: 431 NG/ML — ABNORMAL HIGH (ref 29–371)

## 2007-02-08 LAB — PROTHROMBIN TIME + INR
INR: 1.4 — ABNORMAL HIGH (ref 0.9–1.2)
Prothrombin time: 13.9 SECS — ABNORMAL HIGH (ref 9.5–11.1)

## 2007-02-08 LAB — VITAMIN B12: Vitamin B12: 391 pg/mL (ref >200)

## 2007-02-08 LAB — FOLATE: Folate: 9.6 ng/mL (ref >5.38)

## 2007-02-08 LAB — MAGNESIUM: Magnesium: 1.6 MG/DL — ABNORMAL LOW (ref 1.8–2.4)

## 2007-02-08 MED ORDER — CEFTRIAXONE 2 GRAM SOLUTION FOR INJECTION
2 gram | INTRAMUSCULAR | Status: DC
Start: 2007-02-08 — End: 2007-02-10
  Administered 2007-02-08 – 2007-02-10 (×3): via INTRAVENOUS

## 2007-02-08 MED ORDER — WARFARIN 10 MG TAB
10 mg | Freq: Every evening | ORAL | Status: DC
Start: 2007-02-08 — End: 2007-02-10
  Administered 2007-02-09 – 2007-02-10 (×2): via ORAL

## 2007-02-08 MED FILL — DIPHENHYDRAMINE HCL 50 MG/ML IJ SOLN: 50 mg/mL | INTRAMUSCULAR | Qty: 1

## 2007-02-08 MED FILL — ONDANSETRON (PF) 4 MG/2 ML INJECTION: 4 mg/2 mL | INTRAMUSCULAR | Qty: 2

## 2007-02-08 MED FILL — VANCOMYCIN 1,000 MG IV SOLR: 1000 mg | INTRAVENOUS | Qty: 1000

## 2007-02-08 MED FILL — HEPARIN, PORCINE (PF) 100 UNIT/ML IV SYRINGE: 100 unit/mL | INTRAVENOUS | Qty: 5

## 2007-02-08 MED FILL — CLONAZEPAM 1 MG TAB: 1 mg | ORAL | Qty: 1

## 2007-02-08 MED FILL — LOVENOX 60 MG/0.6 ML SUBCUTANEOUS SYRINGE: 60 mg/0.6 mL | SUBCUTANEOUS | Qty: 0.6

## 2007-02-08 MED FILL — CEFTRIAXONE 2 GRAM SOLUTION FOR INJECTION: 2 gram | INTRAMUSCULAR | Qty: 2

## 2007-02-08 MED FILL — HYDROMORPHONE 2 MG/ML INJECTION SOLUTION: 2 mg/mL | INTRAMUSCULAR | Qty: 1

## 2007-02-08 MED FILL — WARFARIN 5 MG TAB: 5 mg | ORAL | Qty: 1

## 2007-02-08 MED FILL — OXYCODONE 5 MG TAB: 5 mg | ORAL | Qty: 2

## 2007-02-08 MED FILL — LOVENOX 100 MG/ML SUBCUTANEOUS SYRINGE: 100 mg/mL | SUBCUTANEOUS | Qty: 1

## 2007-02-08 MED FILL — ZOLPIDEM 10 MG TAB: 10 mg | ORAL | Qty: 1

## 2007-02-08 MED FILL — CYMBALTA 60 MG CAPSULE,DELAYED RELEASE: 60 mg | ORAL | Qty: 1

## 2007-02-09 LAB — CULTURE, BLOOD

## 2007-02-09 LAB — PROTHROMBIN TIME + INR
INR: 1.5 — ABNORMAL HIGH (ref 0.9–1.2)
Prothrombin time: 15 SECS — ABNORMAL HIGH (ref 9.5–11.1)

## 2007-02-09 MED ORDER — SODIUM CHLORIDE 0.9 % IV
INTRAVENOUS | Status: AC
Start: 2007-02-09 — End: 2007-02-10
  Administered 2007-02-09: 23:00:00 via INTRAVENOUS

## 2007-02-09 MED ORDER — OXYCODONE 5 MG TAB
5 mg | Freq: Four times a day (QID) | ORAL | Status: DC | PRN
Start: 2007-02-09 — End: 2007-02-10
  Administered 2007-02-09 – 2007-02-10 (×5): via ORAL

## 2007-02-09 MED ORDER — OXYCODONE 5 MG TAB
5 mg | Freq: Four times a day (QID) | ORAL | Status: DC | PRN
Start: 2007-02-09 — End: 2007-02-09

## 2007-02-09 MED ORDER — OXYCODONE 5 MG TAB
5 mg | Freq: Four times a day (QID) | ORAL | Status: DC | PRN
Start: 2007-02-09 — End: 2007-02-10

## 2007-02-09 MED ORDER — SODIUM CHLORIDE 0.9% BOLUS IV
0.9 % | Freq: Once | INTRAVENOUS | Status: AC
Start: 2007-02-09 — End: 2007-02-09

## 2007-02-09 MED FILL — OXYCODONE 5 MG TAB: 5 mg | ORAL | Qty: 2

## 2007-02-09 MED FILL — ONDANSETRON (PF) 4 MG/2 ML INJECTION: 4 mg/2 mL | INTRAMUSCULAR | Qty: 2

## 2007-02-09 MED FILL — CLONAZEPAM 1 MG TAB: 1 mg | ORAL | Qty: 1

## 2007-02-09 MED FILL — HEPARIN, PORCINE (PF) 100 UNIT/ML IV SYRINGE: 100 unit/mL | INTRAVENOUS | Qty: 5

## 2007-02-09 MED FILL — OXYCODONE 5 MG TAB: 5 mg | ORAL | Qty: 6

## 2007-02-09 MED FILL — CYMBALTA 60 MG CAPSULE,DELAYED RELEASE: 60 mg | ORAL | Qty: 1

## 2007-02-09 MED FILL — LOVENOX 60 MG/0.6 ML SUBCUTANEOUS SYRINGE: 60 mg/0.6 mL | SUBCUTANEOUS | Qty: 0.6

## 2007-02-09 MED FILL — ZOLPIDEM 10 MG TAB: 10 mg | ORAL | Qty: 1

## 2007-02-09 MED FILL — CEFTRIAXONE 2 GRAM SOLUTION FOR INJECTION: 2 gram | INTRAMUSCULAR | Qty: 2

## 2007-02-09 MED FILL — COUMADIN 10 MG TABLET: 10 mg | ORAL | Qty: 1

## 2007-02-10 LAB — OCCULT BLOOD, STOOL: Occult blood, stool: NEGATIVE

## 2007-02-10 LAB — CULTURE, BLOOD
Culture result:: NO GROWTH
Report Status: 12312008

## 2007-02-10 LAB — PROTHROMBIN TIME + INR
INR: 2.4 — ABNORMAL HIGH (ref 0.9–1.2)
Prothrombin time: 22.2 SECS — ABNORMAL HIGH (ref 9.5–11.1)

## 2007-02-10 MED ORDER — FLUCONAZOLE 100 MG TAB
100 mg | ORAL | Status: AC
Start: 2007-02-10 — End: 2007-02-10
  Administered 2007-02-10: 19:00:00 via ORAL

## 2007-02-10 MED FILL — COUMADIN 10 MG TABLET: 10 mg | ORAL | Qty: 1

## 2007-02-10 MED FILL — OXYCODONE 5 MG TAB: 5 mg | ORAL | Qty: 6

## 2007-02-10 MED FILL — CYMBALTA 60 MG CAPSULE,DELAYED RELEASE: 60 mg | ORAL | Qty: 1

## 2007-02-10 MED FILL — HEPARIN, PORCINE (PF) 100 UNIT/ML IV SYRINGE: 100 unit/mL | INTRAVENOUS | Qty: 5

## 2007-02-10 MED FILL — ONDANSETRON (PF) 4 MG/2 ML INJECTION: 4 mg/2 mL | INTRAMUSCULAR | Qty: 2

## 2007-02-10 MED FILL — LOVENOX 60 MG/0.6 ML SUBCUTANEOUS SYRINGE: 60 mg/0.6 mL | SUBCUTANEOUS | Qty: 0.6

## 2007-02-10 MED FILL — FLUCONAZOLE 100 MG TAB: 100 mg | ORAL | Qty: 1

## 2007-02-10 MED FILL — CLONAZEPAM 1 MG TAB: 1 mg | ORAL | Qty: 1

## 2007-02-10 MED FILL — CEFTRIAXONE 2 GRAM SOLUTION FOR INJECTION: 2 gram | INTRAMUSCULAR | Qty: 2

## 2007-02-10 MED FILL — ZOLPIDEM 10 MG TAB: 10 mg | ORAL | Qty: 1

## 2007-02-11 NOTE — Discharge Summary (Unsigned)
[facility]   [address]   [place], [place]. [address]   [phone]     DISCHARGE SUMMARY    NAME: Krista Lopez, Krista Lopez MR: [id number]  LOC: [id number] SEX: F ACCT: [id number]  DOB: 18-Mar-1965 AGE: 42 PT: I  ADMIT: 02/05/2007 DSCH: 02/10/2007 MSV: MED    DATE OF ADMISSION: 02/05/07    DATE OF DISCHARGE: 02/10/07    PRIMARY DIAGNOSIS: E. Coli bacteremia, unclear source.    SECONDARY DISCHARGE DIAGNOSES:   1. 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   2. Pelvic pain.   3. Possible yeast infection.   4. Anemia.    HOSPITAL COURSE: Please see dictated history and physical. This is a  48 -year-old female with a history of MRSA bacteremia who had been  hospitalized at [facility] from 01/15/07. She left against medical advice  on 02/02/07 because she wanted to be home for Christmas. Patient  re-presented to the emergency room on 02/05/07 with fevers. Blood  cultures done on that day shows 1 out of 2 positive for E. Coli. Repeat  blood cultures on 02/08/07 have been negative. She has been started on  IV antibiotics. She was also started on her Lovenox and coumadin for  her recurrent pulmonary embolus. On the day of discharge, INR was 2.4,  temperature 99.6. She has been afebrile throughout her hospital stay.  Pulse 94, blood pressure 91/53. She did while here, repeatedly ask for  pain medication and did complain of pelvic pain. Work-up was negative,  including a pelvic ultrasound as well as a CT scan of the abdomen and  pelvis. Patient likely may have a yeast infection developing from all  of her antibiotics versus just drug-seeking behavior.    DISCHARGE MEDICATIONS:   1. Coumadin 5 mg alternating with 7.5 mg daily.   2. Oxycodone 50 mg 1 to 2 p.o. q6h p.r.n., dispense 10 with no   refills.   3. Ceftin 500 mg p.o. b.i.d. for 7 days.   4. Diflucan 100 mg p.o. once daily.   5. She was told to resume her other home medications which include   Ambien.   6. Cymbalta.   7. Klonopin.   8. Phenergan.     FOLLOW-UP: Patient has follow-up with a new family physician on 02/15/07  at 10:00. She does not know the name of this physician.                       __________________________________   Lynne Logan, MD A     This is an unverified document unless signed by physician.    TID: [id number] DT: 02/11/2007 10:30 A  JOB: [id number] DOC#: [id number] DD: 02/10/2007    cc: Lynne Logan, MD

## 2007-02-13 LAB — CULTURE, BLOOD
Culture result:: NO GROWTH
Culture result:: NO GROWTH
Report Status: 1032009
Report Status: 1032009

## 2007-02-22 MED ORDER — SODIUM CHLORIDE 0.9% BOLUS IV
0.9 % | INTRAVENOUS | Status: DC
Start: 2007-02-22 — End: 2007-02-23
  Administered 2007-02-23: via INTRAVENOUS

## 2007-02-22 MED ORDER — ONDANSETRON (PF) 4 MG/2 ML INJECTION
4 mg/2 mL | INTRAMUSCULAR | Status: AC
Start: 2007-02-22 — End: 2007-02-22
  Administered 2007-02-23: via INTRAVENOUS

## 2007-02-22 MED ORDER — HYDROMORPHONE 2 MG/ML INJECTION SOLUTION
2 mg/mL | INTRAMUSCULAR | Status: AC
Start: 2007-02-22 — End: 2007-02-22
  Administered 2007-02-23: via INTRAVENOUS

## 2007-02-22 MED FILL — ONDANSETRON (PF) 4 MG/2 ML INJECTION: 4 mg/2 mL | INTRAMUSCULAR | Qty: 2

## 2007-02-22 MED FILL — HYDROMORPHONE 2 MG/ML INJECTION SOLUTION: 2 mg/mL | INTRAMUSCULAR | Qty: 1

## 2007-02-22 NOTE — ED Notes (Signed)
10cc NS flushed into port access and packed with 3cc - 100units Heparin without difficulty.  Port covered with sterile gauze.

## 2007-02-22 NOTE — ED Notes (Signed)
Report given to m Optometrist

## 2007-02-22 NOTE — ED Provider Notes (Signed)
Flank Pain   [name] This is a chronic problem. The current episode started more than 1 week ago. The problem has been gradually worsening. The pain quality is described as shooting. Radiates to: flank pain radiating to groin. The pain is moderate. She has tried nothing for the symptoms. The treatment provided no relief. Risk factors include history of kidney stones.        Past Medical History   Diagnosis Date   ??? Pulmonary Embolism    ??? Factor V (Leiden) Deficiency    ??? Kidney Calculi    ??? Thromboembolus      pulmonary emboli x 15   ??? Cholecystitis with Cholelithiasis    ??? Chronic Kidney Disease      Renal stones   ??? Sleep Disorder      problems sleeping   ??? Neurological Disorder      hx migraines   ??? Other Ill-Defined Conditions      port right subclavian, hx kidney stones          Past Surgical History   Procedure Date   ??? Cystoscopy    ??? Lithotripsy    ??? Hx appendectomy    ??? Hx gyn      cysts from ovaries, c-section, tubal ligation   ??? Hx tubal ligation    ??? Hx tubal ligation    ??? Hx urological      for stones   ??? Hx heent    ??? Hx tonsillectomy    ??? Hx other surgical            No family history on file.     History   Social History   ??? Marital Status: Legally Separated     Spouse Name: N/A     Number of Children: N/A   ??? Years of Education: N/A   Occupational History   ??? Not on file.   Social History Main Topics   ??? Tobacco Use: Never   ??? Alcohol Use: No   ??? Drug Use: No   ??? Sexually Active: Yes -- Female partner(s)     Birth Control/ Protection: Surgical   Other Topics Concern   ??? Not on file   Social History Narrative   ??? No narrative on file           ALLERGIES: Codeine, Pcn, Prednisone, Ketorolac tromethamine, Shellfish, Iv dye, iodine containing and Influenza virus vaccine      Review of Systems   Constitutional: Negative.    HENT: Negative.    Cardiovascular: Negative.    Gastrointestinal: Negative.    Genitourinary: Positive for flank pain.    Musculoskeletal: Positive for back pain.   Skin: Negative.    Neurological: Negative.        Filed Vitals:    02/22/2007  5:18 PM   BP: 117/83   Pulse: 97   Temp: 96.7 ??F (35.9 ??C)   Resp: 16   Height: 5\' 4"  (1.626 m)   SpO2: 98%              Physical Exam   Constitutional: She is oriented. She appears well-developed and well-nourished.   HENT:   Head: Normocephalic.   Neck: Normal range of motion.   Pulmonary/Chest: Effort normal.   Abdominal: Soft.        Left flank pain   Musculoskeletal: Normal range of motion.   Neurological: She is alert and oriented.   Skin: Skin is warm and  dry.

## 2007-02-22 NOTE — ED Notes (Signed)
Pt d/c home in stable condition.  Pt voiced understanding of d/c instructions.

## 2007-02-22 NOTE — ED Notes (Signed)
Pt drinking sprite trying to get urine specimen

## 2007-02-23 LAB — CBC WITH AUTOMATED DIFF
ABS. BASOPHILS: 0.1 10*3/uL (ref 0.0–0.2)
ABS. EOSINOPHILS: 0.2 10*3/uL (ref 0.00–0.80)
ABS. LYMPHOCYTES: 1.5 10*3/uL (ref 0.6–4.3)
ABS. MONOCYTES: 0.7 10*3/uL (ref 0.1–0.9)
ABS. NEUTROPHILS: 3.2 10*3/uL (ref 1.9–7.8)
BASOPHILS: 2 % — ABNORMAL HIGH (ref 0.1–1.6)
EOSINOPHILS: 4 % (ref 0.5–7.8)
HCT: 33.7 % — ABNORMAL LOW (ref 35.6–45.0)
HGB: 11.7 g/dL (ref 11.7–15.0)
LYMPHOCYTES: 27 % (ref 14.7–41.3)
MCH: 32.2 PG (ref 26.1–32.9)
MCHC: 34.6 g/dL (ref 31.4–35.0)
MCV: 93.2 FL (ref 79.6–97.8)
MONOCYTES: 12 % — ABNORMAL HIGH (ref 3.2–9.0)
MPV: 7.8 FL (ref 7.4–10.4)
NEUTROPHILS: 55 % (ref 47.0–74.6)
PLATELET: 347 10*3/uL (ref 140–440)
RBC: 3.62 M/uL — ABNORMAL LOW (ref 3.86–5.18)
RDW: 15.3 % — ABNORMAL HIGH (ref 11.9–14.6)
WBC: 5.7 10*3/uL (ref 4.5–10.5)

## 2007-02-23 LAB — METABOLIC PANEL, BASIC
Anion gap: 14 (ref 7–16)
BUN: 19 MG/DL — ABNORMAL HIGH (ref 7–18)
CO2: 23 MMOL/L (ref 21–32)
Calcium: 9.2 MG/DL (ref 8.4–10.4)
Chloride: 103 MMOL/L (ref 98–107)
Creatinine: 0.8 MG/DL (ref 0.6–1.0)
GFR est AA: >60 mL/min/{1.73_m2} (ref >60)
GFR est non-AA: >60 mL/min/{1.73_m2} (ref >60)
Glucose: 101 MG/DL (ref 74–106)
Potassium: 4.3 MMOL/L (ref 3.5–5.1)
Sodium: 140 MMOL/L (ref 136–145)

## 2007-02-23 MED ORDER — MEPERIDINE (PF) 50 MG/ML INJ SOLN
50 mg/ml | INTRAMUSCULAR | Status: AC
Start: 2007-02-23 — End: 2007-02-22
  Administered 2007-02-23: 01:00:00 via INTRAVENOUS

## 2007-02-23 MED ORDER — HEPARIN LOCK FLUSH 100 UNIT/ML IV SOLN
100 unit/mL | INTRAVENOUS | Status: DC
Start: 2007-02-23 — End: 2007-02-23

## 2007-02-23 MED ADMIN — heparin (porcine) pf 100 unit/mL: @ 03:00:00

## 2007-02-23 MED ADMIN — diphenhydrAMINE (BENADRYL) 25 mg capsule: NDC 96295013109

## 2007-02-23 MED FILL — DIPHENHYDRAMINE 25 MG CAP: 25 mg | ORAL | Qty: 1

## 2007-02-23 MED FILL — HEPARIN, PORCINE (PF) 100 UNIT/ML IV SYRINGE: 100 unit/mL | INTRAVENOUS | Qty: 5

## 2007-02-23 MED FILL — DEMEROL (PF) 50 MG/ML INJECTION SYRINGE: 50 mg/mL | INTRAMUSCULAR | Qty: 1

## 2007-02-27 LAB — CBC WITH AUTOMATED DIFF
ABS. IMM. GRANS.: 0 10*3/uL (ref 0.0–2.0)
ABS. LYMPHOCYTES: 1.1 10*3/uL (ref 0.6–4.3)
ABS. MONOCYTES: 0.2 10*3/uL (ref 0.1–0.9)
ABS. NEUTROPHILS: 2.5 10*3/uL (ref 1.9–7.8)
BASOPHILS: 0 % — ABNORMAL LOW (ref 0.1–1.6)
EOSINOPHILS: 4 % (ref 0.5–7.8)
HCT: 33.8 % — ABNORMAL LOW (ref 35.6–45.0)
HGB: 11.2 g/dL — ABNORMAL LOW (ref 11.7–15.0)
LYMPHOCYTES: 28 % (ref 14.7–41.3)
MCH: 31.1 PG (ref 26.1–32.9)
MCHC: 33.1 g/dL (ref 31.4–35.0)
MCV: 93.9 FL (ref 79.6–97.8)
MONOCYTES: 5 % (ref 3.2–9.0)
MPV: 10 FL (ref 9.3–12.9)
NEUTROPHILS: 63 % (ref 47.0–74.6)
PLATELET: 240 10*3/uL (ref 140–440)
RBC: 3.6 M/uL — ABNORMAL LOW (ref 3.86–5.18)
RDW: 14.2 % (ref 11.9–14.6)
WBC: 4 10*3/uL — ABNORMAL LOW (ref 4.5–10.5)

## 2007-02-27 MED ORDER — LEVOFLOXACIN IN D5W 750 MG/150 ML IV PIGGY BACK
750 mg/150 mL | INTRAVENOUS | Status: AC
Start: 2007-02-27 — End: 2007-02-27
  Administered 2007-02-28: via INTRAVENOUS

## 2007-02-27 NOTE — ED Notes (Signed)
There is some edema over right breast, and area is minimally warm to touch. No redness or discoloration. She reports she cannot raise her right arm, or lay on her right side because of tenderness is subclavian to axillary region. She states her port was last accessed at [facility] on Monday, without difficulty. She is afraid this is infected, and just completed a month of antibiotics.

## 2007-02-27 NOTE — ED Provider Notes (Signed)
HPI Comments: Patient presents to ED with sore and tender area surrounding her port on right upper chest wall. Patient was seen at [facility] on Monday for a kidney stone. They were the last to have access to the port. This is her third port over the past 5 years. The first two all became infected.  She is experiencing pain and reddness (mildly) around the port.         Past Medical History   Diagnosis Date   ??? Pulmonary Embolism    ??? Factor V (Leiden) Deficiency    ??? Kidney Calculi    ??? Thromboembolus      pulmonary emboli x 15   ??? Cholecystitis with Cholelithiasis    ??? Chronic Kidney Disease      Renal stones   ??? Sleep Disorder      problems sleeping   ??? Neurological Disorder      hx migraines   ??? Other Ill-Defined Conditions      port right subclavian, hx kidney stones          Past Surgical History   Procedure Date   ??? Cystoscopy    ??? Lithotripsy    ??? Hx appendectomy    ??? Hx gyn      cysts from ovaries, c-section, tubal ligation   ??? Hx tubal ligation    ??? Hx tubal ligation    ??? Hx urological      for stones   ??? Hx heent    ??? Hx tonsillectomy    ??? Hx other surgical            No family history on file.     History   Social History   ??? Marital Status: Legally Separated     Spouse Name: N/A     Number of Children: N/A   ??? Years of Education: N/A   Occupational History   ??? Not on file.   Social History Main Topics   ??? Tobacco Use: Never   ??? Alcohol Use: No   ??? Drug Use: No   ??? Sexually Active: Yes -- Female partner(s)     Birth Control/ Protection: Surgical   Other Topics Concern   ??? Not on file   Social History Narrative   ??? No narrative on file           ALLERGIES: Codeine, Pcn, Prednisone, Ketorolac tromethamine, Shellfish, Iv dye, iodine containing and Influenza virus vaccine      Review of Systems   Skin:        Reddness tenderness on right chest wall   All other systems reviewed and are negative.      Filed Vitals:    02/27/2007  5:01 PM   BP: 137/83   Pulse: 96   Temp: 98.5 ??F (36.9 ??C)   Resp: 19    Height: 5\' 4"  (1.626 m)   Weight: 138 lb (62.596 kg)   SpO2: 98%              Physical Exam   Nursing note and vitals reviewed.  Constitutional: She is oriented. She appears well-developed and well-nourished.   HENT:   Head: Normocephalic and atraumatic.   Right Ear: External ear normal.   Left Ear: External ear normal.   Nose: Nose normal.   Mouth/Throat: Oropharynx is clear and moist.   Eyes: Conjunctivae and extraocular motions are normal. Pupils are equal, round, and reactive to light.   Neck: Normal range of motion. Neck supple.  Cardiovascular: Normal rate, regular rhythm, normal heart sounds and intact distal pulses.    Pulmonary/Chest: Effort normal and breath sounds normal. No respiratory distress. She has no wheezes. She exhibits tenderness and swelling.       Abdominal: Soft. Bowel sounds are normal.   Musculoskeletal: Normal range of motion. She exhibits no edema and no tenderness.   Neurological: She is alert and oriented. No cranial nerve deficit. She exhibits normal muscle tone.   Skin: Skin is warm and dry.   Psychiatric: She has a normal mood and affect. Her behavior is normal. Judgment and thought content normal.

## 2007-02-27 NOTE — ED Notes (Signed)
Lab contacted for draw for CBC.  Blood C&S inoculated with EJ attempt.

## 2007-02-27 NOTE — ED Notes (Signed)
Port accessed without difficulty. Pt very dramatic, tearful, hyperventilates, takes a lot of encouragement to get through. The port itself is accessible, flushes and allows aspiration of blood. Locked after flush. Dr. Sheffield Slider updated. Pt states she needs pain medication, and this request is relayed to physician.

## 2007-02-27 NOTE — ED Notes (Signed)
Spoke to patient regarding lab and my conversation with Dr. Iona Hansen.

## 2007-02-27 NOTE — ED Notes (Signed)
Port packed with 10 mL of NS and 5 mL of 100 units/ML of heparin. Instructions given and pt esigned instructions. Pt understands instructions.

## 2007-02-27 NOTE — ED Notes (Signed)
Levaquin on pump. Aware infusion time. Tearful and requesting pain medication.

## 2007-02-28 MED ORDER — MORPHINE 10 MG/ML INJ SOLUTION
10 mg/ml | INTRAMUSCULAR | Status: AC
Start: 2007-02-28 — End: 2007-02-27
  Administered 2007-02-28: 01:00:00 via INTRAVENOUS

## 2007-02-28 MED ADMIN — heparin (porcine) pf 100 unit/mL: @ 02:00:00

## 2007-02-28 MED FILL — LEVAQUIN 750 MG/150 ML IN 5 % DEXTROSE INTRAVENOUS PIGGYBACK: 750 mg/150 mL | INTRAVENOUS | Qty: 150

## 2007-02-28 MED FILL — MORPHINE 10 MG/ML SYRINGE: 10 mg/mL | INTRAMUSCULAR | Qty: 1

## 2007-02-28 MED FILL — HEPARIN, PORCINE (PF) 100 UNIT/ML IV SYRINGE: 100 unit/mL | INTRAVENOUS | Qty: 6

## 2007-03-04 LAB — CULTURE, BLOOD
Culture result:: NO GROWTH
Culture result:: NO GROWTH
Report Status: 1222009
Report Status: 1222009

## 2007-03-07 ENCOUNTER — Inpatient Hospital Stay
Admission: EM | Admit: 2007-03-07 | Discharge: 2007-03-12 | Disposition: A | Discharge status: Other Acute Inpatient Hospital | Source: Emergency Department | Attending: Internal Medicine | Admitting: Internal Medicine

## 2007-03-07 DIAGNOSIS — R509 Fever, unspecified: Secondary | ICD-10-CM

## 2007-03-07 LAB — METABOLIC PANEL, COMPREHENSIVE
A-G Ratio: 1 — ABNORMAL LOW (ref 1.2–3.5)
ALT (SGPT): 30 U/L (ref 30–65)
AST (SGOT): 17 U/L (ref 15–37)
Albumin: 4 g/dL (ref 3.5–5.0)
Alk. phosphatase: 127 U/L (ref 50–136)
Anion gap: 14 (ref 7–16)
BUN: 11 MG/DL (ref 7–18)
Bilirubin, total: 0.4 MG/DL (ref 0.2–1.1)
CO2: 23 MMOL/L (ref 21–32)
Calcium: 9.4 MG/DL (ref 8.4–10.4)
Chloride: 102 MMOL/L (ref 98–107)
Creatinine: 0.8 MG/DL (ref 0.6–1.0)
GFR est AA: >60 mL/min/{1.73_m2} (ref >60)
GFR est non-AA: >60 mL/min/{1.73_m2} (ref >60)
Globulin: 4 g/dL — ABNORMAL HIGH (ref 2.3–3.5)
Glucose: 100 MG/DL (ref 74–106)
Potassium: 3.9 MMOL/L (ref 3.5–5.1)
Protein, total: 8 g/dL (ref 6.3–8.2)
Sodium: 139 MMOL/L (ref 136–145)

## 2007-03-07 LAB — GLUCOSE, CSF
Glucose,CSF: 60 MG/DL (ref 40–70)
Tube No.: 1

## 2007-03-07 LAB — DRUG SCREEN, URINE
ACETAMINOPHEN: NEGATIVE
AMPHETAMINES: NEGATIVE
BARBITURATES: NEGATIVE
BENZODIAZEPINES: NEGATIVE
COCAINE: NEGATIVE
METHADONE: NEGATIVE
Methamphetamines: NEGATIVE
OPIATES: NEGATIVE
PCP(PHENCYCLIDINE): NEGATIVE
THC (TH-CANNABINOL): NEGATIVE
TRICYCLICS: NEGATIVE

## 2007-03-07 LAB — CBC WITH AUTOMATED DIFF
ABS. IMM. GRANS.: 0.1 10*3/uL (ref 0.0–2.0)
ABS. LYMPHOCYTES: 1.3 10*3/uL (ref 0.6–4.3)
ABS. MONOCYTES: 0.6 10*3/uL (ref 0.1–0.9)
ABS. NEUTROPHILS: 10.3 10*3/uL — ABNORMAL HIGH (ref 1.9–7.8)
BASOPHILS: 0 % — ABNORMAL LOW (ref 0.1–1.6)
EOSINOPHILS: 1 % (ref 0.5–7.8)
HCT: 33.6 % — ABNORMAL LOW (ref 35.6–45.0)
HGB: 11 g/dL — ABNORMAL LOW (ref 11.7–15.0)
IMMATURE GRANULOCYTES: 0.5 % (ref 0.0–2.0)
LYMPHOCYTES: 10 % — ABNORMAL LOW (ref 14.7–41.3)
MCH: 30.8 PG (ref 26.1–32.9)
MCHC: 32.7 g/dL (ref 31.4–35.0)
MCV: 94.1 FL (ref 79.6–97.8)
MONOCYTES: 5 % (ref 3.2–9.0)
MPV: 10.7 FL (ref 9.3–12.9)
NEUTROPHILS: 84 % — ABNORMAL HIGH (ref 47.0–74.6)
PLATELET: 217 10*3/uL (ref 140–440)
RBC: 3.57 M/uL — ABNORMAL LOW (ref 3.86–5.18)
RDW: 14.1 % (ref 11.9–14.6)
WBC: 12.4 10*3/uL — ABNORMAL HIGH (ref 4.5–10.5)

## 2007-03-07 LAB — CELL COUNT, BODY FLUID
FLUID RBC CT.: 2 /mm3
FLUID WBC COUNT: 1 /mm3

## 2007-03-07 LAB — PROTEIN, CSF
Protein,CSF: 42 MG/DL (ref 15–45)
Tube No.: 1

## 2007-03-07 LAB — PROTHROMBIN TIME + INR
INR: 1 (ref 0.9–1.2)
Prothrombin time: 10.4 SECS (ref 9.5–11.1)

## 2007-03-07 LAB — PTT: aPTT: 28 s (ref 23.5–31.7)

## 2007-03-07 MED ORDER — ZOLPIDEM 10 MG TAB
10 mg | Freq: Every evening | ORAL | Status: DC | PRN
Start: 2007-03-07 — End: 2007-03-12
  Administered 2007-03-08 – 2007-03-12 (×5): via ORAL

## 2007-03-07 MED ORDER — HYDROMORPHONE 2 MG/ML INJECTION SOLUTION
2 mg/mL | INTRAMUSCULAR | Status: AC
Start: 2007-03-07 — End: 2007-03-07

## 2007-03-07 MED ORDER — SODIUM CHLORIDE 0.9 % IV PIGGY BACK
3.375 gram | Freq: Four times a day (QID) | INTRAVENOUS | Status: DC
Start: 2007-03-07 — End: 2007-03-10
  Administered 2007-03-08 (×2): via INTRAVENOUS
  Administered 2007-03-08: 03:00:00
  Administered 2007-03-08 – 2007-03-10 (×7): via INTRAVENOUS
  Administered 2007-03-10: 03:00:00
  Administered 2007-03-10: 11:00:00 via INTRAVENOUS

## 2007-03-07 MED ORDER — WARFARIN 5 MG TAB
5 mg | Freq: Every evening | ORAL | Status: DC
Start: 2007-03-07 — End: 2007-03-08
  Administered 2007-03-08: 02:00:00 via ORAL
  Administered 2007-03-08: 03:00:00

## 2007-03-07 MED ORDER — ONDANSETRON (PF) 4 MG/2 ML INJECTION
4 mg/2 mL | INTRAMUSCULAR | Status: AC
Start: 2007-03-07 — End: 2007-03-07
  Administered 2007-03-07: 18:00:00 via INTRAVENOUS

## 2007-03-07 MED ORDER — SALINE PERIPHERAL FLUSH Q8H
Freq: Three times a day (TID) | INTRAMUSCULAR | Status: DC
Start: 2007-03-07 — End: 2007-03-08
  Administered 2007-03-07 – 2007-03-08 (×4)

## 2007-03-07 MED ORDER — DIPHENHYDRAMINE HCL 50 MG/ML IJ SOLN
50 mg/mL | INTRAMUSCULAR | Status: AC
Start: 2007-03-07 — End: 2007-03-07

## 2007-03-07 MED ORDER — SODIUM CHLORIDE 0.9 % IV
INTRAVENOUS | Status: DC
Start: 2007-03-07 — End: 2007-03-09
  Administered 2007-03-07 – 2007-03-09 (×4): via INTRAVENOUS

## 2007-03-07 MED ORDER — PROMETHAZINE 25 MG TAB
25 mg | Freq: Four times a day (QID) | ORAL | Status: DC | PRN
Start: 2007-03-07 — End: 2007-03-12
  Administered 2007-03-08 – 2007-03-09 (×5): via ORAL

## 2007-03-07 MED ORDER — HYDROMORPHONE 2 MG/ML INJECTION SOLUTION
2 mg/mL | INTRAMUSCULAR | Status: AC
Start: 2007-03-07 — End: 2007-03-08
  Administered 2007-03-07: 21:00:00

## 2007-03-07 MED ORDER — SODIUM CHLORIDE 0.9% BOLUS IV
0.9 % | Freq: Once | INTRAVENOUS | Status: AC
Start: 2007-03-07 — End: 2007-03-07
  Administered 2007-03-07: 18:00:00 via INTRAVENOUS

## 2007-03-07 MED ORDER — CLONAZEPAM 1 MG TAB
1 mg | Freq: Three times a day (TID) | ORAL | Status: DC | PRN
Start: 2007-03-07 — End: 2007-03-12
  Administered 2007-03-08 – 2007-03-12 (×10): via ORAL

## 2007-03-07 MED ORDER — HYDROMORPHONE 2 MG/ML INJECTION SOLUTION
2 mg/mL | INTRAMUSCULAR | Status: DC | PRN
Start: 2007-03-07 — End: 2007-03-08
  Administered 2007-03-08: 01:00:00 via INTRAVENOUS

## 2007-03-07 MED ORDER — SODIUM CHLORIDE 0.9% BOLUS IV
0.9 % | INTRAVENOUS | Status: DC
Start: 2007-03-07 — End: 2007-03-07

## 2007-03-07 MED ORDER — SALINE PERIPHERAL FLUSH PRN
INTRAMUSCULAR | Status: DC | PRN
Start: 2007-03-07 — End: 2007-03-08

## 2007-03-07 MED ORDER — MORPHINE 10 MG/ML INJ SOLUTION
10 mg/ml | INTRAMUSCULAR | Status: AC
Start: 2007-03-07 — End: 2007-03-07
  Administered 2007-03-07: 18:00:00 via INTRAVENOUS

## 2007-03-07 MED ORDER — ENOXAPARIN 60 MG/0.6 ML SUB-Q SYRINGE
60 mg/0.6 mL | Freq: Two times a day (BID) | SUBCUTANEOUS | Status: DC
Start: 2007-03-07 — End: 2007-03-08
  Administered 2007-03-08 (×2): via SUBCUTANEOUS

## 2007-03-07 MED ADMIN — HYDROmorphone (DILAUDID) 2 mg/mL injection: INTRAVENOUS | @ 21:00:00 | NDC 00409131236

## 2007-03-07 MED ADMIN — diphenhydrAMINE (BENADRYL) 50 mg/mL injection: INTRAVENOUS | @ 18:00:00 | NDC 57866728801

## 2007-03-07 MED ADMIN — piperacillin-tazobactam(ZOSYN) 4.5 g IVPB: INTRAVENOUS | @ 21:00:00 | NDC 81284015310

## 2007-03-07 MED FILL — HYDROMORPHONE 2 MG/ML INJECTION SOLUTION: 2 mg/mL | INTRAMUSCULAR | Qty: 1

## 2007-03-07 MED FILL — MORPHINE 10 MG/ML SYRINGE: 10 mg/mL | INTRAMUSCULAR | Qty: 1

## 2007-03-07 MED FILL — ONDANSETRON (PF) 4 MG/2 ML INJECTION: 4 mg/2 mL | INTRAMUSCULAR | Qty: 2

## 2007-03-07 MED FILL — ZOSYN 4.5 GRAM INTRAVENOUS SOLUTION: 4.5 gram | INTRAVENOUS | Qty: 4.5

## 2007-03-07 MED FILL — DIPHENHYDRAMINE HCL 50 MG/ML IJ SOLN: 50 mg/mL | INTRAMUSCULAR | Qty: 1

## 2007-03-07 NOTE — ED Notes (Signed)
Pt tearful. Requesting pain medication, Dr. Cyndie Chime aware.

## 2007-03-07 NOTE — ED Notes (Signed)
TRANSFER - OUT REPORT:    Verbal report given to [place], RN on Krista Lopez  being transferred to [id number] for routine progression of care       Report consisted of patient???s Situation, Background, Assessment and   Recommendations(SBAR).     Information from the following report(s) Kardex, MAR and Recent Results was reveiwed with the receiving nurse.    Opportunity for questions and clarification was provided.     ER transport tech called to transfer pt to 6th floor.

## 2007-03-07 NOTE — H&P (Unsigned)
[facility] [place] [facility]   [address]   [employer] [place], [place]. [address]   [phone]     HISTORY AND PHYSICAL    NAME: Shadana, Pry MR: [id number]  LOC: ER SEX: F ACCT: [id number]  DOB: 28-Apr-1965 AGE: 42 PT: E  ADMIT: DSCH: MSV: EMR    CHIEF COMPLAINT: Fever and chills.    HISTORY OF PRESENT ILLNESS: The patient is a 42 year old white female  with a known history of bacteremia diagnosed back in November 2008,  presents with a 1-day history of fever and chills with a temperature  reaching a maximum of 104. The patient states 2 days ago she felt fine  and was not having any symptoms but began to feel sick about 3 p.m. the  day before presentation. Back in November the patient was admitted to  the hospital for a 3-week stay and placed on IV antibiotics and was in  [place] for quite awhile, however, she left on December 21 against  medical advice and did not complete antibiotic treatment. She returned  in early January again with bacteremia and states that she just finished  her course of Bactrim 2 days ago. The patient is known to have recurrent  kidney stones that have resulted in kidney infections in the past. She  is also known to have factor V Leiden deficiency with her current  ________. She does have a right-sided subclavian port and Greenfield  filter. The patient currently is not having any respiratory complaints.  She denies urinary complaints or abdominal complaints. She did state  that she has thrown up 4 times this morning, yellowish fluid in color,  and then she is having chills although she does not feel cold she states  that she feels like she is burning up. Other than that she denies any  other symptoms.    PAST MEDICAL HISTORY: Significant for factor V Leiden deficiency with  patient reporting 15 pulmonary emboli in the past; the first one was in  2002. Recurrent kidney stones, gallstones, Greenfield filter placed in  2007, right-sided port placed in 2003.     FAMILY HISTORY: Reviewed and noncontributory to this admission.    ALLERGIES: CODEINE, PENICILLIN, PREDNISONE, KETORALAC, SHELLFISH AND IV  DYE.    MEDICATIONS:   1. Ambien.   2. Lovenox 100 every other day.   3. Phenergan suppository.   4. Klonopin.   5. Coumadin 5.   6. Cymbalta 60.   7. Completion of Bactrim 2 days ago.    SOCIAL HISTORY: The patient strongly denies alcohol, drug or tobacco  use.    REVIEW OF SYSTEMS:  GENERAL: The patient is having chills and fever.  SKIN: No complaints.  MOUTH: Patient complains of dry throat.  RESPIRATORY: Negative for shortness of breath.  CARDIAC: Negative for chest pain.  GI: Positive for nausea, vomiting. Negative for diarrhea, constipation.    URINARY: Negative for dysuria.  VASCULAR: Negative for lower extremity edema.  NEUROLOGIC: Negative for blackouts or seizures.  MUSCULOSKELETAL: Negative for weakness.  ENDOCRINE: Negative for history of diabetes or thyroid problems in the  past.    PHYSICAL EXAMINATION: The patient is visibly shivering. Temperature is  currently 103, pulse is in the 120s, respirations 18, blood pressure  105/56.  Skin, no unusual lesions. Head is atraumatic. Mouth is dry. Neck is  supple with full range of motion. When she touches her chin to her  breastplate there is no pain. Heart, sinus tachycardia. Lungs clear to  auscultation bilaterally. Musculoskeletal  negative for atrophy.  Vascular negative for lower extremity edema. Neurologic, no focal  deficits.    LABORATORIES: White blood cells 12.4, hemoglobin 11, platelets 217.  Sodium 139, potassium 3.9, chloride 102, CO2 23, BUN 11, creatinine 0.8,  blood glucose 100.    ASSESSMENT:   1. Fever of unknown origin, tachycardia, chills and headache with  recent history of bacteremia.   2. History of recurrent kidney stones and infections.   3. 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   4. Psychiatric.   5. Prophylaxis.    6. Disposition.      PLAN:   1. Check urinalysis, urine culture, renal ultrasound, chest x-ray,   blood cultures, spinal tap if PTT is normal, CBC and BMP daily, pain   control.   2. Renal ultrasound.   3. After tap will resume Coumadin and full dose Lovenox.   4. Continue meds.   5. ________ PPI is not indicated.   6. Home when stable.                   __________________________________   Margit Banda, MD A           This is an unverified document unless signed by physician.    TID: [id number] DT: 03/07/2007 2:36 P  JOB: [id number] DOC#: [id number] DD: 03/07/2007     cc: Margit Banda, MD

## 2007-03-07 NOTE — ED Notes (Signed)
Dr. Julius Bowels in to see pt at this time. Requested to hold Zosyn dose until spinal tap completed.

## 2007-03-07 NOTE — ED Provider Notes (Signed)
HPI Comments: Pt stated she had been admitted for multiple days in Dec to the [facility] hospital for IV antibiotics. She continues to have febrile spikes. She finished her last antibiotics 2 days ago and yesterday she began with fever to 104 assoc with vomiting and aching. She had no other change in the last day. She has a hx of Factor V dec and is currently taking coumadin and lovenox.    Chills   [name] This is a recurrent problem. The current episode started yesterday. The problem has not changed since onset. The maximum temperature noted was 103 - 104 F. The temperature was taken using an oral thermometer. Associated symptoms include vomiting, headaches and muscle aches. Pertinent negatives include no chest pain, no diarrhea, no congestion, no sore throat, no cough, no shortness of breath, no mental status change, no neck pain, no rash and no urinary symptoms. She has tried acetaminophen for the symptoms.        Past Medical History   Diagnosis Date   ??? Pulmonary Embolism    ??? Factor V (Leiden) Deficiency    ??? Kidney Calculi    ??? Thromboembolus      pulmonary emboli x 15   ??? Cholecystitis with Cholelithiasis    ??? Chronic Kidney Disease      Renal stones   ??? Autoimmune Disease      factor 5 defficiency   ??? Sleep Disorder      problems sleeping   ??? Neurological Disorder      hx migraines   ??? Infectious Disease      MRSA- blood cultures   ??? Other Ill-Defined Conditions      port right subclavian, hx kidney stones          Past Surgical History   Procedure Date   ??? Cystoscopy    ??? Lithotripsy    ??? Hx vascular access      port to right chest s/p 5 years.   ??? Hx appendectomy    ??? Hx gyn      cysts from ovaries, c-section, tubal ligation   ??? Hx tubal ligation    ??? Hx tubal ligation    ??? Hx urological      for stones   ??? Hx heent    ??? Hx tonsillectomy    ??? Hx other surgical            No family history on file.     History   Social History   ??? Marital Status: Legally Separated      Spouse Name: N/A     Number of Children: N/A   ??? Years of Education: N/A   Occupational History   ??? Not on file.   Social History Main Topics   ??? Tobacco Use: Never   ??? Alcohol Use: No   ??? Drug Use: No   ??? Sexually Active: Yes -- Female partner(s)     Birth Control/ Protection: Surgical   Other Topics Concern   ??? Not on file   Social History Narrative   ??? No narrative on file           ALLERGIES: Codeine, Pcn, Prednisone, Ketorolac tromethamine, Shellfish, Iv dye, iodine containing and Influenza virus vaccine      Review of Systems   Constitutional: Positive for fever and chills.   HENT: Negative for congestion, sore throat and neck pain.    Respiratory: Negative for cough and shortness of  breath.    Cardiovascular: Negative for chest pain.   Gastrointestinal: Positive for nausea and vomiting. Negative for diarrhea.   Musculoskeletal: Positive for myalgias.   Skin: Negative for rash.   Neurological: Positive for headaches.   All other systems reviewed and are negative.      Filed Vitals:    03/07/2007 10:55 AM 03/07/2007 11:45 AM   BP: 105/56    Pulse: 129    Temp: 100.3 ??F (37.9 ??C) 101 ??F (38.3 ??C)   Resp: 18    Height: 5\' 4"  (1.626 m)    Weight: 138 lb (62.596 kg)    SpO2: 95%               Physical Exam   Nursing note and vitals reviewed.  Constitutional: She is oriented. She appears well-developed and well-nourished. She appears distressed.   HENT:   Head: Normocephalic and atraumatic.   Left Ear: External ear normal.   Mouth/Throat: Oropharynx is clear and moist.        Tm's ok   Eyes: Extraocular motions are normal. Pupils are equal, round, and reactive to light.   Neck: Normal range of motion. Neck supple.   Cardiovascular: Normal rate and regular rhythm.    Pulmonary/Chest: Effort normal.   Abdominal: Soft. Bowel sounds are normal. No tenderness.   Musculoskeletal: Normal range of motion. She exhibits no edema and no tenderness.   Lymphadenopathy:     She has no cervical adenopathy.    Neurological: She is alert and oriented.   Skin: Skin is warm and dry.   Psychiatric: She has a normal mood and affect.

## 2007-03-07 NOTE — ED Notes (Signed)
Discussed with Dr. Maretta Bees, who did not know the pat. Will start the w/u and he will come to see the pt.

## 2007-03-07 NOTE — ED Notes (Signed)
Pt had some morphine that caused to itch and scratch. She says this happens occas with morphine and dilaudid. Told her Dr. Maretta Bees will be seening her.

## 2007-03-07 NOTE — ED Notes (Signed)
Pt intermittently taking her own temperature with her own digital thermometer. Temp now increased to 104 orally. Awaiting results of PTT prior to performing LP. IV antibiotics not to be started until after LP completed.

## 2007-03-07 NOTE — ED Notes (Signed)
Pt c/o itching all over after morphine was given, Dr Cyndie Chime notified & medicated per oders with benadryl. Itching improving almost immediately affter administration

## 2007-03-07 NOTE — ED Notes (Signed)
Pt recently discharged to home from here after "28 days for fever of 103.1" treated with IV antibiotics for "MRSA of unknown" origin. Pt states that she took 3 doses ES tylenol since 0400 with fever of "104.9".

## 2007-03-07 NOTE — ED Notes (Signed)
PTT result called to Dr. Julius Bowels.

## 2007-03-07 NOTE — ED Notes (Signed)
1mg  iv dilaudid administered, pt c/o slight discomfort with flushing, dressing to right chest noted to be wet. Dressing removed and port reaccessed. New dressing applied.

## 2007-03-08 LAB — GRAM STAIN: GRAM STAIN: NONE SEEN

## 2007-03-08 LAB — CBC W/O DIFF
HCT: 28.2 % — ABNORMAL LOW (ref 35.6–45.0)
HGB: 8.9 g/dL — ABNORMAL LOW (ref 11.7–15.0)
MCH: 29.9 PG (ref 26.1–32.9)
MCHC: 31.6 g/dL (ref 31.4–35.0)
MCV: 94.6 FL (ref 79.6–97.8)
MPV: 10.9 FL (ref 9.3–12.9)
PLATELET: 161 10*3/uL (ref 140–440)
RBC: 2.98 M/uL — ABNORMAL LOW (ref 3.86–5.18)
RDW: 14 % (ref 11.9–14.6)
WBC: 6.3 10*3/uL (ref 4.5–10.5)

## 2007-03-08 LAB — METABOLIC PANEL, BASIC
Anion gap: 9 (ref 7–16)
BUN: 8 MG/DL (ref 7–18)
CO2: 24 MMOL/L (ref 21–32)
Calcium: 8.9 MG/DL (ref 8.4–10.4)
Chloride: 107 MMOL/L (ref 98–107)
Creatinine: 0.9 MG/DL (ref 0.6–1.0)
GFR est AA: >60 mL/min/{1.73_m2} (ref >60)
GFR est non-AA: >60 mL/min/{1.73_m2} (ref >60)
Glucose: 102 MG/DL (ref 74–106)
Potassium: 3.2 MMOL/L — ABNORMAL LOW (ref 3.5–5.1)
Sodium: 140 MMOL/L (ref 136–145)

## 2007-03-08 LAB — PROTHROMBIN TIME + INR
INR: 1.1 (ref 0.9–1.2)
Prothrombin time: 11.4 SECS — ABNORMAL HIGH (ref 9.5–11.1)

## 2007-03-08 MED ORDER — PROMETHAZINE 25 MG/ML INJECTION
25 mg/mL | INTRAMUSCULAR | Status: AC | PRN
Start: 2007-03-08 — End: 2007-03-08

## 2007-03-08 MED ORDER — HYDROMORPHONE 2 MG/ML INJECTION SOLUTION
2 mg/mL | INTRAMUSCULAR | Status: AC | PRN
Start: 2007-03-08 — End: 2007-03-08

## 2007-03-08 MED ORDER — HYDROCODONE-ACETAMINOPHEN 5 MG-500 MG TAB
5-500 mg | ORAL | Status: AC | PRN
Start: 2007-03-08 — End: 2007-03-08

## 2007-03-08 MED ORDER — LIDOCAINE (PF) 20 MG/ML (2 %) IV SYRINGE
100 mg/5 mL (2 %) | INTRAVENOUS | Status: AC | PRN
Start: 2007-03-08 — End: 2007-03-08

## 2007-03-08 MED ORDER — ACETAMINOPHEN 325 MG TABLET
325 mg | Freq: Four times a day (QID) | ORAL | Status: AC
Start: 2007-03-08 — End: 2007-03-08
  Administered 2007-03-08: 17:00:00 via ORAL
  Administered 2007-03-08 (×2)
  Administered 2007-03-08: via ORAL

## 2007-03-08 MED ORDER — LACTATED RINGERS IV
INTRAVENOUS | Status: AC
Start: 2007-03-08 — End: 2007-03-08
  Administered 2007-03-08: via INTRAVENOUS

## 2007-03-08 MED ORDER — ATROPINE 0.4 MG/ML IJ SOLN
0.4 mg/mL | INTRAMUSCULAR | Status: AC | PRN
Start: 2007-03-08 — End: 2007-03-08

## 2007-03-08 MED ORDER — POTASSIUM CHLORIDE SR 10 MEQ CAP
10 mEq | Freq: Once | ORAL | Status: AC
Start: 2007-03-08 — End: 2007-03-08
  Administered 2007-03-08: via ORAL

## 2007-03-08 MED ORDER — ACETAMINOPHEN 325 MG TABLET
325 mg | Freq: Four times a day (QID) | ORAL | Status: DC | PRN
Start: 2007-03-08 — End: 2007-03-12
  Administered 2007-03-08 (×2): via ORAL

## 2007-03-08 MED ORDER — HYDROMORPHONE 2 MG/ML INJECTION SOLUTION
2 mg/mL | INTRAMUSCULAR | Status: DC | PRN
Start: 2007-03-08 — End: 2007-03-09
  Administered 2007-03-08 – 2007-03-09 (×8): via INTRAVENOUS

## 2007-03-08 MED ADMIN — duloxetine (CYMBALTA) capsule 60 mg: ORAL | @ 14:00:00 | NDC 82009003210

## 2007-03-08 MED FILL — ZOSYN 3.375 GRAM INTRAVENOUS SOLUTION: 3.375 gram | INTRAVENOUS | Qty: 3.38

## 2007-03-08 MED FILL — HYDROMORPHONE 2 MG/ML INJECTION SOLUTION: 2 mg/mL | INTRAMUSCULAR | Qty: 1

## 2007-03-08 MED FILL — SODIUM CHLORIDE 0.9 % IV: INTRAVENOUS | Qty: 1000

## 2007-03-08 MED FILL — LOVENOX 60 MG/0.6 ML SUBCUTANEOUS SYRINGE: 60 mg/0.6 mL | SUBCUTANEOUS | Qty: 0.6

## 2007-03-08 MED FILL — ACETAMINOPHEN 325 MG TABLET: 325 mg | ORAL | Qty: 2

## 2007-03-08 MED FILL — WARFARIN 5 MG TAB: 5 mg | ORAL | Qty: 1

## 2007-03-08 MED FILL — CLONAZEPAM 1 MG TAB: 1 mg | ORAL | Qty: 1

## 2007-03-08 MED FILL — PROMETHAZINE 25 MG TAB: 25 mg | ORAL | Qty: 1

## 2007-03-08 MED FILL — PROMETHAZINE 25 MG/ML INJECTION: 25 mg/mL | INTRAMUSCULAR | Qty: 1

## 2007-03-08 MED FILL — POTASSIUM CHLORIDE SR 10 MEQ CAP: 10 mEq | ORAL | Qty: 4

## 2007-03-08 MED FILL — CYMBALTA 60 MG CAPSULE,DELAYED RELEASE: 60 mg | ORAL | Qty: 1

## 2007-03-08 MED FILL — ZOLPIDEM 10 MG TAB: 10 mg | ORAL | Qty: 1

## 2007-03-09 LAB — METABOLIC PANEL, BASIC
Anion gap: 9 (ref 7–16)
BUN: 4 MG/DL — ABNORMAL LOW (ref 7–18)
CO2: 25 MMOL/L (ref 21–32)
Calcium: 8.8 MG/DL (ref 8.4–10.4)
Chloride: 109 MMOL/L — ABNORMAL HIGH (ref 98–107)
Creatinine: 0.7 MG/DL (ref 0.6–1.0)
GFR est AA: >60 mL/min/{1.73_m2} (ref >60)
GFR est non-AA: >60 mL/min/{1.73_m2} (ref >60)
Glucose: 77 MG/DL (ref 74–106)
Potassium: 3.7 MMOL/L (ref 3.5–5.1)
Sodium: 143 MMOL/L (ref 136–145)

## 2007-03-09 LAB — CBC W/O DIFF
HCT: 28.9 % — ABNORMAL LOW (ref 35.6–45.0)
HGB: 9.4 g/dL — ABNORMAL LOW (ref 11.7–15.0)
MCH: 30.7 PG (ref 26.1–32.9)
MCHC: 32.5 g/dL (ref 31.4–35.0)
MCV: 94.4 FL (ref 79.6–97.8)
MPV: 10 FL (ref 9.3–12.9)
PLATELET: 155 10*3/uL (ref 140–440)
RBC: 3.06 M/uL — ABNORMAL LOW (ref 3.86–5.18)
RDW: 13.8 % (ref 11.9–14.6)
WBC: 4.5 10*3/uL (ref 4.5–10.5)

## 2007-03-09 LAB — PROTHROMBIN TIME + INR
INR: 1.1 (ref 0.9–1.2)
Prothrombin time: 11.1 SECS (ref 9.5–11.1)

## 2007-03-09 LAB — MAGNESIUM: Magnesium: 1.7 MG/DL — ABNORMAL LOW (ref 1.8–2.4)

## 2007-03-09 MED ORDER — SODIUM CHLORIDE 0.9 % IV PIGGY BACK
1000 mg | Freq: Two times a day (BID) | INTRAVENOUS | Status: DC
Start: 2007-03-09 — End: 2007-03-09

## 2007-03-09 MED ORDER — ENOXAPARIN 60 MG/0.6 ML SUB-Q SYRINGE
60 mg/0.6 mL | Freq: Two times a day (BID) | SUBCUTANEOUS | Status: DC
Start: 2007-03-09 — End: 2007-03-12
  Administered 2007-03-10 – 2007-03-12 (×6): via SUBCUTANEOUS

## 2007-03-09 MED ORDER — HYDROCODONE-ACETAMINOPHEN 5 MG-500 MG TAB
5-500 mg | Freq: Four times a day (QID) | ORAL | Status: DC | PRN
Start: 2007-03-09 — End: 2007-03-12
  Administered 2007-03-09 – 2007-03-10 (×3): via ORAL

## 2007-03-09 MED ORDER — WARFARIN 5 MG TAB
5 mg | Freq: Every evening | ORAL | Status: DC
Start: 2007-03-09 — End: 2007-03-10
  Administered 2007-03-10: 02:00:00 via ORAL
  Administered 2007-03-10: 03:00:00

## 2007-03-09 MED ORDER — VANCOMYCIN 1,000 MG IV SOLR
1000 mg | Freq: Three times a day (TID) | INTRAVENOUS | Status: DC
Start: 2007-03-09 — End: 2007-03-10
  Administered 2007-03-09 – 2007-03-10 (×4): via INTRAVENOUS

## 2007-03-09 MED ORDER — HYDROCODONE-ACETAMINOPHEN 5 MG-500 MG TAB
5-500 mg | Freq: Four times a day (QID) | ORAL | Status: DC | PRN
Start: 2007-03-09 — End: 2007-03-12
  Administered 2007-03-10 – 2007-03-12 (×8): via ORAL

## 2007-03-09 MED ADMIN — duloxetine (CYMBALTA) capsule 60 mg: ORAL | @ 14:00:00 | NDC 82009003210

## 2007-03-09 MED FILL — ZOSYN 3.375 GRAM INTRAVENOUS SOLUTION: 3.375 gram | INTRAVENOUS | Qty: 3.38

## 2007-03-09 MED FILL — CLONAZEPAM 1 MG TAB: 1 mg | ORAL | Qty: 1

## 2007-03-09 MED FILL — HYDROMORPHONE 2 MG/ML INJECTION SOLUTION: 2 mg/mL | INTRAMUSCULAR | Qty: 1

## 2007-03-09 MED FILL — LIDOCAINE-EPINEPHRINE 1 %-1:100,000 IJ SOLN: 1 %-:00,000 | INTRAMUSCULAR | Qty: 20

## 2007-03-09 MED FILL — CYMBALTA 60 MG CAPSULE,DELAYED RELEASE: 60 mg | ORAL | Qty: 1

## 2007-03-09 MED FILL — HYDROCODONE-ACETAMINOPHEN 5 MG-500 MG TAB: 5-500 mg | ORAL | Qty: 1

## 2007-03-09 MED FILL — ZOLPIDEM 10 MG TAB: 10 mg | ORAL | Qty: 1

## 2007-03-09 MED FILL — PROMETHAZINE 25 MG TAB: 25 mg | ORAL | Qty: 1

## 2007-03-09 MED FILL — VANCOMYCIN 1,000 MG IV SOLR: 1000 mg | INTRAVENOUS | Qty: 1000

## 2007-03-10 LAB — METABOLIC PANEL, BASIC
Anion gap: 11 (ref 7–16)
BUN: 5 MG/DL — ABNORMAL LOW (ref 7–18)
CO2: 25 MMOL/L (ref 21–32)
Calcium: 9 MG/DL (ref 8.4–10.4)
Chloride: 110 MMOL/L — ABNORMAL HIGH (ref 98–107)
Creatinine: 0.8 MG/DL (ref 0.6–1.0)
GFR est AA: >60 mL/min/{1.73_m2} (ref >60)
GFR est non-AA: >60 mL/min/{1.73_m2} (ref >60)
Glucose: 86 MG/DL (ref 74–106)
Potassium: 3.8 MMOL/L (ref 3.5–5.1)
Sodium: 146 MMOL/L — ABNORMAL HIGH (ref 136–145)

## 2007-03-10 LAB — CULTURE, BLOOD/ARD

## 2007-03-10 LAB — CULTURE, MRSA: Culture result:: NOT DETECTED

## 2007-03-10 LAB — PROTHROMBIN TIME + INR
INR: 1 (ref 0.9–1.2)
Prothrombin time: 9.8 SECS (ref 9.5–11.1)

## 2007-03-10 LAB — CBC W/O DIFF
HCT: 26.8 % — ABNORMAL LOW (ref 35.6–45.0)
HGB: 8.5 g/dL — ABNORMAL LOW (ref 11.7–15.0)
MCH: 29.7 PG (ref 26.1–32.9)
MCHC: 31.7 g/dL (ref 31.4–35.0)
MCV: 93.7 FL (ref 79.6–97.8)
MPV: 10.9 FL (ref 9.3–12.9)
PLATELET: 224 10*3/uL (ref 140–440)
RBC: 2.86 M/uL — ABNORMAL LOW (ref 3.86–5.18)
RDW: 13.6 % (ref 11.9–14.6)
WBC: 3.7 10*3/uL — ABNORMAL LOW (ref 4.5–10.5)

## 2007-03-10 MED ORDER — WARFARIN 10 MG TAB
10 mg | Freq: Every evening | ORAL | Status: DC
Start: 2007-03-10 — End: 2007-03-12
  Administered 2007-03-11 – 2007-03-12 (×2): via ORAL

## 2007-03-10 MED ORDER — SODIUM CHLORIDE 0.9 % IV
500 mg | INTRAVENOUS | Status: DC
Start: 2007-03-10 — End: 2007-03-12
  Administered 2007-03-11: 21:00:00 via INTRAVENOUS

## 2007-03-10 MED ORDER — CEFTRIAXONE 2 GRAM SOLUTION FOR INJECTION
2 gram | INTRAMUSCULAR | Status: DC
Start: 2007-03-10 — End: 2007-03-12
  Administered 2007-03-11: 16:00:00 via INTRAVENOUS
  Administered 2007-03-11: 18:00:00

## 2007-03-10 MED ADMIN — aspirin-acetaminophen-caffeine 250-250-65 mg Tab 2 Tab: ORAL | @ 17:00:00 | NDC 96619092812

## 2007-03-10 MED ADMIN — duloxetine (CYMBALTA) capsule 60 mg: ORAL | @ 14:00:00 | NDC 82009003210

## 2007-03-10 MED FILL — CLONAZEPAM 1 MG TAB: 1 mg | ORAL | Qty: 1

## 2007-03-10 MED FILL — HYDROCODONE-ACETAMINOPHEN 5 MG-500 MG TAB: 5-500 mg | ORAL | Qty: 2

## 2007-03-10 MED FILL — LOVENOX 60 MG/0.6 ML SUBCUTANEOUS SYRINGE: 60 mg/0.6 mL | SUBCUTANEOUS | Qty: 0.6

## 2007-03-10 MED FILL — ZOSYN 3.375 GRAM INTRAVENOUS SOLUTION: 3.375 gram | INTRAVENOUS | Qty: 3.38

## 2007-03-10 MED FILL — SODIUM CHLORIDE 0.9 % IV PIGGY BACK: INTRAVENOUS | Qty: 100

## 2007-03-10 MED FILL — ZOLPIDEM 10 MG TAB: 10 mg | ORAL | Qty: 1

## 2007-03-10 MED FILL — CUBICIN 500 MG INTRAVENOUS SOLUTION: 500 mg | INTRAVENOUS | Qty: 500

## 2007-03-10 MED FILL — VANCOMYCIN 1,000 MG IV SOLR: 1000 mg | INTRAVENOUS | Qty: 1000

## 2007-03-10 MED FILL — WARFARIN 5 MG TAB: 5 mg | ORAL | Qty: 1

## 2007-03-10 MED FILL — SODIUM CHLORIDE 0.9 % IV PIGGY BACK: INTRAVENOUS | Qty: 250

## 2007-03-10 MED FILL — CYMBALTA 60 MG CAPSULE,DELAYED RELEASE: 60 mg | ORAL | Qty: 1

## 2007-03-10 NOTE — Op Note (Unsigned)
[facility]   [address]   [place], [place]. [address]   [phone]     OPERATIVE REPORT    NAME: Krista Lopez, Krista Lopez MR: [id number]  LOC: 06 [id number] SEX: F ACCT: [id number]  DOB: 07-08-1965 AGE: 42 PT: I  ADMIT: 03/07/2007 DSCH: MSV: MED    DATE OF SURGERY: 03/08/2007    PREOPERATIVE DIAGNOSIS: Bacteremia.    POSTOPERATIVE DIAGNOSIS: Bacteremia with port infection.    PROCEDURE PERFORMED: Removal of left internal jugular LifePort.    SURGEON: Iva Lento, MD    ANESTHESIA: Mask and local.    CONDITION AT COMPLETION: Stable.    ESTIMATED BLOOD LOSS: None.    INDICATIONS FOR THE PROCEDURE: This patient is a 42 year old, white  female who has a left-sided LifePort in place that she has had for  several years. She is currently admitted due to bacteremia. There is  purulent fluid around the port that can be expressed during needle  puncture. The risks, benefits, and alternatives to removing the port  were discussed in detail with her, and she wished to have it moved as  soon as possible. Appropriate consent was obtained.    DESCRIPTION OF THE PROCEDURE: The patient was taken to the operating  room where she was placed on the table in the supine position.  Intravenous antibiotics were given following induction. The chest was  prepped and draped in the usual sterile fashion. Mask anesthesia was  performed to the appropriate level of sedation. Half-percent Marcaine  with epinephrine was used for local anesthetic around the port itself.  An incision was made over the port. Dissection was carried down into the  capsule surrounding the port. This was done bluntly and upon entrance.  There was a large amount of pus that was expressed. Some of this fluid  was sent for aerobic and anaerobic culture. The port was freed from the  surrounding tissues and removed. The wound cavity was irrigated with  warm saline. A small Penrose drain was left in the cavity for continued   drainage, and the skin was closed with staples. The patient tolerated  the procedure well with no complications. She was taken to recovery in  stable condition.              ____________________________________Joseph Robbie Lis, MD A     This is an unverified document unless signed by physician.    TID: [id number] DT: 03/10/2007 10:44 A  JOB: [id number] DOC#: [id number] DD: 03/10/2007    cc: Iva Lento, MD

## 2007-03-10 NOTE — Consults (Unsigned)
[facility]   [address]   [place], [place]. [address]   [phone]     CONSULTATION    NAME: Krista Lopez, Krista Lopez MR: [id number]  LOC: 06 [id number] SEX: F ACCT: [id number]  DOB: November 04, 1965 AGE: 42 PT: I  ADMIT: 03/07/2007 DSCH: MSV: MED    DATE OF CONSULTATION: 03/10/2007    Ms. Knust is a 42 year old female seen at the courtesy of Dr. Margit Banda from the hospitalist service. This patient has a history of  factor V Leiden deficiency and had a thrombosis. She apparently was  initially hospitalized in [place] and was transferred to [facility]  for further IV antibiotic therapy. She did apparently have a bacteremia  during that time. She apparently had left [facility] in late  December against medical advice. She states she had evidence of  infection at her port site at that time. She represented to [facility] on 03/07/2007 with high temperatures to 104 degrees with shaking  chills. Her port was assessed and felt to have evidence of surrounding  infection and this has been removed. Her admission blood cultures  obtained from the right port showed both methicillin-resistant  Staphylococcus aureus and E. coli. The E. coli is resistant to  levofloxacin and trimethoprim sulfamethoxazole. Infectious disease  consultation is sought at this time regarding recommendations for  selection and duration of antibiotic therapy.    PAST MEDICAL HISTORY: As stated, significant for factor V Leiden  deficiency and has had recurrent pulmonary emboli. She has also had  recurrent kidney stones, gallstones, and has had a Greenfield filter  placed in the past. She has had at least 2 previous Port-A-Caths.    REVIEW OF SYSTEMS: She has very difficult IV access. Initially review  of systems was significant for high chills and fever. She did have  evidence of tenderness at her Port-A-Cath when she first presented. She  was not having chest pain or any significant GI symptoms. No significant   GU symptoms. She does not have any history of diabetes or thyroid  disease. Otherwise review of systems is negative.    SOCIAL HISTORY: Negative for alcohol or tobacco.    ALLERGIES: Listed as CODEINE, PREDNISONE, KETORALAC, PENICILLIN,  SHELLFISH, IV DYE; however, it is noted that the patient did receive  piperacillin for several days with no evidence of reaction.      PHYSICAL EXAMINATION:  VITAL SIGNS: Afebrile with stable vital signs.  GENERAL: She is complaining of headache.  LUNGS: Clear.  HEART: No significant audible murmur.  SKIN: The port has been removed. No diffuse rash is noted.  NEUROLOGIC: Screening neurological exam is nonfocal.    LABORATORY: White count 3.7, hemoglobin 8.5, platelet count 224,000.  Urine culture has been no growth. Creatinine is 0.8. As stated, blood  cultures on 03/07/2007 showed both the E. coli and MRSA. Subsequent  blood cultures on 03/09/2007 have been negative. The culture of her  Port-A-Cath site grew methicillin-resistant Staphylococcus aureus. An  echocardiogram obtained on 01/20/2007 was a technically adequate study  and showed no evidence of valvular vegetation. Chest CT scan on  03/08/2007 showed no suspicious findings within the lungs.    IMPRESSION:   1. Polymicrobial bacteremia likely associated with infected   Port-A-Cath now status post removal. I suspect the primary pathogen   here is methicillin-resistant Staphylococcus aureus.   2. Factor V Leiden deficiency.   3. Recurrent pulmonary emboli.    RECOMMENDATIONS:   1. Regarding antibiotics, would treat  this as an endovascular   infection with 4 weeks of IV antibiotics in the form of daptomycin 500   mg IV q.24 h. and Rocephin 2 g IV q.24 h.   2. Would discontinue vancomycin and Zosyn.   3. Will discuss options for continued IV antibiotic therapy.   4. Monitor CBC with differential, CPK, creatinine, and liver function   studies weekly while she is on this antibiotic regimen.                      __________________________________   Consuello Closs, MD A     This is an unverified document unless signed by physician.    TID: [id number] DT: 03/10/2007 11:53 A  JOB: [id number] DOC#: [id number] DD: 03/10/2007    cc: Consuello Closs, MD

## 2007-03-11 LAB — METABOLIC PANEL, BASIC
Anion gap: 12 (ref 7–16)
BUN: 3 MG/DL — ABNORMAL LOW (ref 7–18)
CO2: 28 MMOL/L (ref 21–32)
Calcium: 9.3 MG/DL (ref 8.4–10.4)
Chloride: 106 MMOL/L (ref 98–107)
Creatinine: 0.8 MG/DL (ref 0.6–1.0)
GFR est AA: >60 mL/min/{1.73_m2} (ref >60)
GFR est non-AA: >60 mL/min/{1.73_m2} (ref >60)
Glucose: 87 MG/DL (ref 74–106)
Potassium: 4 MMOL/L (ref 3.5–5.1)
Sodium: 146 MMOL/L — ABNORMAL HIGH (ref 136–145)

## 2007-03-11 LAB — CULTURE, BLOOD/ARD

## 2007-03-11 LAB — CBC W/O DIFF
HCT: 30.5 % — ABNORMAL LOW (ref 35.6–45.0)
HGB: 9.7 g/dL — ABNORMAL LOW (ref 11.7–15.0)
MCH: 30 PG (ref 26.1–32.9)
MCHC: 31.8 g/dL (ref 31.4–35.0)
MCV: 94.4 FL (ref 79.6–97.8)
MPV: 10.7 FL (ref 9.3–12.9)
PLATELET: 232 10*3/uL (ref 140–440)
RBC: 3.23 M/uL — ABNORMAL LOW (ref 3.86–5.18)
RDW: 13.5 % (ref 11.9–14.6)
WBC: 3.7 10*3/uL — ABNORMAL LOW (ref 4.5–10.5)

## 2007-03-11 LAB — CK: CK: 16 U/L — ABNORMAL LOW (ref 21–215)

## 2007-03-11 LAB — FUNGAL AB, CSF BY CF
Aspergillus Ab: <1:2 {titer}
Blastomyces Ab: <1:2 {titer}
Coccidioides Ab: <1:2 {titer}
Histoplasma M: <1:2 {titer}
Histoplasma Y: <1:2 {titer}

## 2007-03-11 LAB — PROTHROMBIN TIME + INR
INR: 1.1 (ref 0.9–1.2)
Prothrombin time: 11.1 SECS (ref 9.5–11.1)

## 2007-03-11 MED ORDER — HEPARIN, PORCINE (PF) 10 UNIT/ML IV SOLN
10 unit/mL | INTRAVENOUS | Status: DC | PRN
Start: 2007-03-11 — End: 2007-03-12

## 2007-03-11 MED ORDER — ZOLMITRIPTAN 2.5 MG TAB
2.5 mg | Freq: Four times a day (QID) | ORAL | Status: DC
Start: 2007-03-11 — End: 2007-03-12
  Administered 2007-03-11 – 2007-03-12 (×4): via ORAL

## 2007-03-11 MED ORDER — HEPARIN LOCK FLUSH (PORCINE) 10 UNIT/ML IV SOLN
10 unit/mL | INTRAVENOUS | Status: DC | PRN
Start: 2007-03-11 — End: 2007-03-12
  Administered 2007-03-11 – 2007-03-12 (×2)

## 2007-03-11 MED ADMIN — duloxetine (CYMBALTA) capsule 60 mg: ORAL | @ 16:00:00 | NDC 82009003210

## 2007-03-11 MED FILL — ZOMIG 2.5 MG TABLET: 2.5 mg | ORAL | Qty: 1

## 2007-03-11 MED FILL — LOVENOX 60 MG/0.6 ML SUBCUTANEOUS SYRINGE: 60 mg/0.6 mL | SUBCUTANEOUS | Qty: 0.6

## 2007-03-11 MED FILL — CLONAZEPAM 1 MG TAB: 1 mg | ORAL | Qty: 1

## 2007-03-11 MED FILL — ONDANSETRON (PF) 4 MG/2 ML INJECTION: 4 mg/2 mL | INTRAMUSCULAR | Qty: 2

## 2007-03-11 MED FILL — FENTANYL CITRATE (PF) 50 MCG/ML IJ SOLN: 50 mcg/mL | INTRAMUSCULAR | Qty: 4

## 2007-03-11 MED FILL — HYDROCODONE-ACETAMINOPHEN 5 MG-500 MG TAB: 5-500 mg | ORAL | Qty: 2

## 2007-03-11 MED FILL — HEPARIN LOCK 100 UNIT/ML INTRAVENOUS SOLUTION: 100 unit/mL | INTRAVENOUS | Qty: 10

## 2007-03-11 MED FILL — CYMBALTA 60 MG CAPSULE,DELAYED RELEASE: 60 mg | ORAL | Qty: 1

## 2007-03-11 MED FILL — CEFTRIAXONE 2 GRAM SOLUTION FOR INJECTION: 2 gram | INTRAMUSCULAR | Qty: 2

## 2007-03-11 MED FILL — COUMADIN 10 MG TABLET: 10 mg | ORAL | Qty: 1

## 2007-03-11 MED FILL — HEPARIN (PORCINE) IN NS (PF) 2,000 UNIT/1,000 ML IV: 2000 unit/1,000 mL | INTRAVENOUS | Qty: 1000

## 2007-03-11 MED FILL — LIDOCAINE HCL 2 % (20 MG/ML) IJ SOLN: 20 mg/mL (2 %) | INTRAMUSCULAR | Qty: 20

## 2007-03-11 MED FILL — LIDOCAINE-EPINEPHRINE 2 %-1:100,000 IJ SOLN: 2 %-1:100,000 | INTRAMUSCULAR | Qty: 20

## 2007-03-11 MED FILL — HEP FLUSH-10 (PF) 10 UNIT/ML INTRAVENOUS SOLUTION: 10 unit/mL | INTRAVENOUS | Qty: 10

## 2007-03-11 MED FILL — HEPARIN LOCK 10 UNIT/ML INTRAVENOUS SOLUTION: 10 unit/mL | INTRAVENOUS | Qty: 30

## 2007-03-11 MED FILL — MIDAZOLAM 1 MG/ML IJ SOLN: 1 mg/mL | INTRAMUSCULAR | Qty: 5

## 2007-03-11 MED FILL — ZOLPIDEM 10 MG TAB: 10 mg | ORAL | Qty: 1

## 2007-03-11 MED FILL — CUBICIN 500 MG INTRAVENOUS SOLUTION: 500 mg | INTRAVENOUS | Qty: 500

## 2007-03-12 ENCOUNTER — Encounter
Admit: 2007-03-12 | Discharge: 2007-03-21 | Disposition: A | Discharge status: Home / Self Care | Source: Home / Self Care | Attending: Internal Medicine | Admitting: Internal Medicine

## 2007-03-12 LAB — METABOLIC PANEL, BASIC
Anion gap: 7 (ref 7–16)
BUN: 6 MG/DL — ABNORMAL LOW (ref 7–18)
CO2: 32 MMOL/L (ref 21–32)
Calcium: 9.1 MG/DL (ref 8.4–10.4)
Chloride: 104 MMOL/L (ref 98–107)
Creatinine: 0.7 MG/DL (ref 0.6–1.0)
GFR est AA: >60 mL/min/{1.73_m2} (ref >60)
GFR est non-AA: >60 mL/min/{1.73_m2} (ref >60)
Glucose: 89 MG/DL (ref 74–106)
Potassium: 3.9 MMOL/L (ref 3.5–5.1)
Sodium: 143 MMOL/L (ref 136–145)

## 2007-03-12 LAB — CBC WITH AUTOMATED DIFF
ABS. IMM. GRANS.: 0 10*3/uL (ref 0.0–2.0)
ABS. LYMPHOCYTES: 1.2 10*3/uL (ref 0.6–4.3)
ABS. MONOCYTES: 0.4 10*3/uL (ref 0.1–0.9)
ABS. NEUTROPHILS: 3.6 10*3/uL (ref 1.9–7.8)
BASOPHILS: 0 % — ABNORMAL LOW (ref 0.1–1.6)
EOSINOPHILS: 6 % (ref 0.5–7.8)
HCT: 30.2 % — ABNORMAL LOW (ref 35.6–45.0)
HGB: 9.8 g/dL — ABNORMAL LOW (ref 11.7–15.0)
IMMATURE GRANULOCYTES: 0.2 % (ref 0.0–2.0)
LYMPHOCYTES: 22 % (ref 14.7–41.3)
MCH: 30.4 PG (ref 26.1–32.9)
MCHC: 32.5 g/dL (ref 31.4–35.0)
MCV: 93.8 FL (ref 79.6–97.8)
MONOCYTES: 7 % (ref 3.2–9.0)
MPV: 10.2 FL (ref 9.3–12.9)
NEUTROPHILS: 65 % (ref 47.0–74.6)
PLATELET: 281 10*3/uL (ref 140–440)
RBC: 3.22 M/uL — ABNORMAL LOW (ref 3.86–5.18)
RDW: 13.4 % (ref 11.9–14.6)
WBC: 5.5 10*3/uL (ref 4.5–10.5)

## 2007-03-12 LAB — CULTURE, WOUND W GRAM STAIN: GRAM STAIN: 0

## 2007-03-12 LAB — URINALYSIS W/O MICRO
Bilirubin: NEGATIVE
Blood: NEGATIVE
Casts: 0 /LPF
Crystals, urine: 0 /LPF
Glucose: NEGATIVE MG/DL
Ketone: NEGATIVE MG/DL
Leukocyte Esterase: NEGATIVE
Mucus: 0 /LPF
Nitrites: NEGATIVE
Protein: NEGATIVE MG/DL
RBC: 0 /HPF
Specific gravity: 1.015 (ref 1.001–1.02)
Urobilinogen: 0.2 EU/DL (ref 0.2–1.0)
pH (UA): 7 (ref 5.0–9.0)

## 2007-03-12 LAB — CBC W/O DIFF
HCT: 30.7 % — ABNORMAL LOW (ref 35.6–45.0)
HGB: 9.8 g/dL — ABNORMAL LOW (ref 11.7–15.0)
MCH: 30.2 PG (ref 26.1–32.9)
MCHC: 31.9 g/dL (ref 31.4–35.0)
MCV: 94.5 FL (ref 79.6–97.8)
MPV: 10.9 FL (ref 9.3–12.9)
PLATELET: 285 10*3/uL (ref 140–440)
RBC: 3.25 M/uL — ABNORMAL LOW (ref 3.86–5.18)
RDW: 13.6 % (ref 11.9–14.6)
WBC: 4.2 10*3/uL — ABNORMAL LOW (ref 4.5–10.5)

## 2007-03-12 LAB — VITAMIN B12: Vitamin B12: 354 pg/mL (ref >200)

## 2007-03-12 LAB — PROTHROMBIN TIME + INR
INR: 1.5 — ABNORMAL HIGH (ref 0.9–1.2)
INR: 1.8 — ABNORMAL HIGH (ref 0.9–1.2)
Prothrombin time: 14.3 SECS — ABNORMAL HIGH (ref 9.5–11.1)
Prothrombin time: 17.4 SECS — ABNORMAL HIGH (ref 9.5–11.1)

## 2007-03-12 LAB — PREALBUMIN: Prealbumin: 20.3 MG/DL (ref 18.0–35.7)

## 2007-03-12 LAB — MAGNESIUM: Magnesium: 1.8 MG/DL (ref 1.8–2.4)

## 2007-03-12 LAB — TSH, 2ND GENERATION: TSH,2nd Generation: 0.2 u[IU]/mL — ABNORMAL LOW (ref 0.350–5.50)

## 2007-03-12 MED ORDER — WARFARIN 7.5 MG TAB
7.5 mg | Freq: Every evening | ORAL | Status: DC
Start: 2007-03-12 — End: 2007-03-12

## 2007-03-12 MED ADMIN — duloxetine (CYMBALTA) capsule 60 mg: ORAL | @ 15:00:00 | NDC 82009003210

## 2007-03-12 MED FILL — HEPARIN LOCK 10 UNIT/ML INTRAVENOUS SOLUTION: 10 unit/mL | INTRAVENOUS | Qty: 30

## 2007-03-12 MED FILL — ZOMIG 2.5 MG TABLET: 2.5 mg | ORAL | Qty: 1

## 2007-03-12 MED FILL — COUMADIN 10 MG TABLET: 10 mg | ORAL | Qty: 1

## 2007-03-12 MED FILL — HYDROCODONE-ACETAMINOPHEN 5 MG-500 MG TAB: 5-500 mg | ORAL | Qty: 2

## 2007-03-12 MED FILL — CUBICIN 500 MG INTRAVENOUS SOLUTION: 500 mg | INTRAVENOUS | Qty: 500

## 2007-03-12 MED FILL — CYMBALTA 60 MG CAPSULE,DELAYED RELEASE: 60 mg | ORAL | Qty: 1

## 2007-03-12 MED FILL — LOVENOX 60 MG/0.6 ML SUBCUTANEOUS SYRINGE: 60 mg/0.6 mL | SUBCUTANEOUS | Qty: 0.6

## 2007-03-12 MED FILL — ZOLPIDEM 10 MG TAB: 10 mg | ORAL | Qty: 1

## 2007-03-12 MED FILL — CLONAZEPAM 1 MG TAB: 1 mg | ORAL | Qty: 1

## 2007-03-12 NOTE — Discharge Summary (Unsigned)
[facility]   One [address]   [place], [place]. [address]   [phone]     DISCHARGE SUMMARY    NAME: Krista Lopez, Krista Lopez MR: [id number]  LOC: 06 [id number] SEX: F ACCT: [id number]  DOB: Jul 29, 1965 AGE: 42 PT: I  ADMIT: 03/07/2007 DSCH: MSV: MED    ADMISSION DIAGNOSIS: Sepsis.    DISCHARGE DIAGNOSES:   1. Methicillin-resistant Staphylococcus aureus and E. coli   bacteremia.   2. Migraine headache.    ADMITTING SERVICE: Hospitalist.    CONSULTS: Infectious disease.    PROCEDURES: Removal of port, placement of a central line which was  ultrasound-guided, renal ultrasound, CT of the chest without contrast,  spinal tap.    HISTORY AND PHYSICAL EXAMINATION AT TIME OF ADMISSION: Please refer to  the dictated H P.    HOSPITAL COURSE: The patient was admitted to the hospital and started on  broad-spectrum antibiotics. Spinal tap was done, which did not reveal  any infection. Blood cultures returned which did show MRSA and E. coli.  The patient had initially been on Zosyn and Levaquin; however, infectious  disease was consulted and regimen was changed to Rocephin and daptomycin,  which the patient will need until 03/29/2007. Given her history of  leaving AMA and being noncompliant with antibiotics in the past with this  being recurrent infection and with drug-seeking behavior, it was felt  both by myself and infectious disease that the patient will need  inpatient IV antibiotic treatment and will not be allowed to go home with  this. Social work was consulted, and it was decided that [facility] would  accept the patient. She is currently medically stable and simply needs  long-term IV antibiotics. The patient did continue to have migraines  throughout the hospital course. She has been started on Zomig for this,  and we will continue until her migraines subside . She is supposed to be  on coumadin for her factor V Leiden deficiency; however, on admission her  INR was found to be 1.0. It is strongly felt that she is being   noncompliant with her medications. Ultimately I placed her on full-dose  Lovenox. Initially increased her coumadin to 10 mg nightly. Her current  INR is 1.4 and trending up, so I will decrease her coumadin back to 7.5  mg nightly, and this is the dose that she will go to [place] on. Her INR  will need to be followed up in [place].    DISCHARGE CONDITION: Good.    DISPOSITION: To [facility].    MEDICATIONS:   1. Lovenox 60 mg subcutaneously b.i.d. until INR gets between 2 and   3.   2. Zomig 2.5 mg orally q.6 h. scheduled until migraine subsides.   3. Rocephin 2 g IV daily until 03/29/2007.   4. Daptomycin 500 mg IV daily until 03/29/2007.   5. Coumadin 7.5 mg orally at bedtime.   6. Phenergan 25 mg suppository q.4 h. p.r.n.   7. Cymbalta 60 mg orally daily.    INSTRUCTIONS: Diet and activity as per [facility]. Followup will be as per  [facility] [place].                       __________________________________   Margit Banda, MD A     This is an unverified document unless signed by physician.    TID: [id number] DT: 03/12/2007 8:49 A  JOB: [id number] DOC#: [id number] DD: 03/12/2007    cc: Margit Banda, MD

## 2007-03-13 LAB — METABOLIC PANEL, COMPREHENSIVE
A-G Ratio: 1 — ABNORMAL LOW (ref 1.2–3.5)
ALT (SGPT): 31 U/L (ref 30–65)
AST (SGOT): 15 U/L (ref 15–37)
Albumin: 3.3 g/dL — ABNORMAL LOW (ref 3.5–5.0)
Alk. phosphatase: 156 U/L — ABNORMAL HIGH (ref 50–136)
Anion gap: 6 — ABNORMAL LOW (ref 7–16)
BUN: 6 MG/DL — ABNORMAL LOW (ref 7–18)
Bilirubin, total: <0.1 MG/DL — ABNORMAL LOW (ref 0.2–1.1)
CO2: 31 MMOL/L (ref 21–32)
Calcium: 9.2 MG/DL (ref 8.4–10.4)
Chloride: 104 MMOL/L (ref 98–107)
Creatinine: 0.8 MG/DL (ref 0.6–1.0)
GFR est AA: >60 mL/min/{1.73_m2} (ref >60)
GFR est non-AA: >60 mL/min/{1.73_m2} (ref >60)
Globulin: 3.4 g/dL (ref 2.3–3.5)
Glucose: 100 MG/DL (ref 74–106)
Potassium: 4.3 MMOL/L (ref 3.5–5.1)
Protein, total: 6.7 g/dL (ref 6.3–8.2)
Sodium: 141 MMOL/L (ref 136–145)

## 2007-03-13 LAB — C DIFFICILE TOXIN A & B BY EIA: C. difficile toxin A&B: NEGATIVE

## 2007-03-13 LAB — CULTURE, CSF W GRAM STAIN
Culture result:: NO GROWTH
GRAM STAIN: NONE SEEN

## 2007-03-13 LAB — PROTHROMBIN TIME + INR
INR: 1.9 — ABNORMAL HIGH (ref 0.9–1.2)
Prothrombin time: 18.4 SECS — ABNORMAL HIGH (ref 9.5–11.1)

## 2007-03-14 LAB — CULTURE, URINE
Culture result:: NO GROWTH
Culture: NO GROWTH

## 2007-03-14 LAB — CULTURE, BLOOD/ARD
Culture result:: NO GROWTH
Culture result:: NO GROWTH
Report Status: 2012009
Report Status: 2012009

## 2007-03-14 LAB — CULTURE, MRSA: Culture result:: NOT DETECTED

## 2007-03-14 LAB — CULTURE, VRE SCREEN

## 2007-03-14 LAB — URINE MACROSCOPIC
Bilirubin: NEGATIVE
Blood: NEGATIVE
Glucose: NEGATIVE MG/DL
Ketone: NEGATIVE MG/DL
Leukocyte Esterase: NEGATIVE
Nitrites: NEGATIVE
Protein: NEGATIVE MG/DL
Specific gravity: 1.01 (ref 1.001–1.02)
Urobilinogen: 0.2 EU/DL (ref 0.2–1.0)
pH (UA): 7 (ref 5.0–9.0)

## 2007-03-14 LAB — PROTHROMBIN TIME + INR
INR: 2.8 — ABNORMAL HIGH (ref 0.9–1.2)
Prothrombin time: 25.6 SECS — ABNORMAL HIGH (ref 9.5–11.1)

## 2007-03-15 LAB — CBC WITH AUTOMATED DIFF
ABS. IMM. GRANS.: 0 10*3/uL (ref 0.0–2.0)
ABS. LYMPHOCYTES: 2 10*3/uL (ref 0.6–4.3)
ABS. MONOCYTES: 0.3 10*3/uL (ref 0.1–0.9)
ABS. NEUTROPHILS: 1.5 10*3/uL — ABNORMAL LOW (ref 1.9–7.8)
BASOPHILS: 1 % (ref 0.1–1.6)
EOSINOPHILS: 13 % — ABNORMAL HIGH (ref 0.5–7.8)
HCT: 31.2 % — ABNORMAL LOW (ref 35.6–45.0)
HGB: 9.8 g/dL — ABNORMAL LOW (ref 11.7–15.0)
IMMATURE GRANULOCYTES: 0.2 % (ref 0.0–2.0)
LYMPHOCYTES: 45 % — ABNORMAL HIGH (ref 14.7–41.3)
MCH: 30.2 PG (ref 26.1–32.9)
MCHC: 31.4 g/dL (ref 31.4–35.0)
MCV: 96 FL (ref 79.6–97.8)
MONOCYTES: 7 % (ref 3.2–9.0)
MPV: 10.6 FL (ref 9.3–12.9)
NEUTROPHILS: 34 % — ABNORMAL LOW (ref 47.0–74.6)
PLATELET: 325 10*3/uL (ref 140–440)
RBC: 3.25 M/uL — ABNORMAL LOW (ref 3.86–5.18)
RDW: 13.8 % (ref 11.9–14.6)
WBC: 4.4 10*3/uL — ABNORMAL LOW (ref 4.5–10.5)

## 2007-03-15 LAB — METABOLIC PANEL, COMPREHENSIVE
A-G Ratio: 1 — ABNORMAL LOW (ref 1.2–3.5)
ALT (SGPT): 43 U/L (ref 30–65)
AST (SGOT): 34 U/L (ref 15–37)
Albumin: 3.2 g/dL — ABNORMAL LOW (ref 3.5–5.0)
Alk. phosphatase: 170 U/L — ABNORMAL HIGH (ref 50–136)
Anion gap: 8 (ref 7–16)
BUN: 8 MG/DL (ref 7–18)
Bilirubin, total: <0.1 MG/DL — ABNORMAL LOW (ref 0.2–1.1)
CO2: 28 MMOL/L (ref 21–32)
Calcium: 9.1 MG/DL (ref 8.4–10.4)
Chloride: 107 MMOL/L (ref 98–107)
Creatinine: 0.7 MG/DL (ref 0.6–1.0)
GFR est AA: >60 mL/min/{1.73_m2} (ref >60)
GFR est non-AA: >60 mL/min/{1.73_m2} (ref >60)
Globulin: 3.3 g/dL (ref 2.3–3.5)
Glucose: 85 MG/DL (ref 74–106)
Potassium: 3.9 MMOL/L (ref 3.5–5.1)
Protein, total: 6.5 g/dL (ref 6.3–8.2)
Sodium: 143 MMOL/L (ref 136–145)

## 2007-03-15 LAB — PROTHROMBIN TIME + INR
INR: 2.4 — ABNORMAL HIGH (ref 0.9–1.2)
Prothrombin time: 22.8 SECS — ABNORMAL HIGH (ref 9.5–11.1)

## 2007-03-15 LAB — CULTURE, ANAEROBIC: Culture result:: NO GROWTH

## 2007-03-16 LAB — CULTURE, BLOOD
Culture result:: NO GROWTH
Culture result:: NO GROWTH
Report Status: 2032009
Report Status: 2032009

## 2007-03-16 LAB — PROTHROMBIN TIME + INR
INR: 2.2 — ABNORMAL HIGH (ref 0.9–1.2)
Prothrombin time: 20.7 SECS — ABNORMAL HIGH (ref 9.5–11.1)

## 2007-03-17 LAB — PROTHROMBIN TIME + INR
INR: 2.3 — ABNORMAL HIGH (ref 0.9–1.2)
Prothrombin time: 21.7 SECS — ABNORMAL HIGH (ref 9.5–11.1)

## 2007-03-17 LAB — CBC WITH AUTOMATED DIFF
ABS. IMM. GRANS.: 0 10*3/uL (ref 0.0–2.0)
ABS. LYMPHOCYTES: 1.9 10*3/uL (ref 0.6–4.3)
ABS. MONOCYTES: 0.3 10*3/uL (ref 0.1–0.9)
ABS. NEUTROPHILS: 1.1 10*3/uL — ABNORMAL LOW (ref 1.9–7.8)
BASOPHILS: 1 % (ref 0.1–1.6)
EOSINOPHILS: 11 % — ABNORMAL HIGH (ref 0.5–7.8)
HCT: 32.6 % — ABNORMAL LOW (ref 35.6–45.0)
HGB: 10.2 g/dL — ABNORMAL LOW (ref 11.7–15.0)
LYMPHOCYTES: 50 % — ABNORMAL HIGH (ref 14.7–41.3)
MCH: 30.5 PG (ref 26.1–32.9)
MCHC: 31.3 g/dL — ABNORMAL LOW (ref 31.4–35.0)
MCV: 97.6 FL (ref 79.6–97.8)
MONOCYTES: 8 % (ref 3.2–9.0)
MPV: 11 FL (ref 9.3–12.9)
NEUTROPHILS: 30 % — ABNORMAL LOW (ref 47.0–74.6)
PLATELET: 347 10*3/uL (ref 140–440)
RBC: 3.34 M/uL — ABNORMAL LOW (ref 3.86–5.18)
RDW: 14 % (ref 11.9–14.6)
WBC: 3.8 10*3/uL — ABNORMAL LOW (ref 4.5–10.5)

## 2007-03-17 LAB — METABOLIC PANEL, COMPREHENSIVE
A-G Ratio: 1 — ABNORMAL LOW (ref 1.2–3.5)
ALT (SGPT): 38 U/L (ref 30–65)
AST (SGOT): 28 U/L (ref 15–37)
Albumin: 3.4 g/dL — ABNORMAL LOW (ref 3.5–5.0)
Alk. phosphatase: 164 U/L — ABNORMAL HIGH (ref 50–136)
Anion gap: 9 (ref 7–16)
BUN: 13 MG/DL (ref 7–18)
Bilirubin, total: <0.1 MG/DL — ABNORMAL LOW (ref 0.2–1.1)
CO2: 28 MMOL/L (ref 21–32)
Calcium: 9.2 MG/DL (ref 8.4–10.4)
Chloride: 101 MMOL/L (ref 98–107)
Creatinine: 0.8 MG/DL (ref 0.6–1.0)
GFR est AA: >60 mL/min/{1.73_m2} (ref >60)
GFR est non-AA: >60 mL/min/{1.73_m2} (ref >60)
Globulin: 3.4 g/dL (ref 2.3–3.5)
Glucose: 100 MG/DL (ref 74–106)
Potassium: 3.4 MMOL/L — ABNORMAL LOW (ref 3.5–5.1)
Protein, total: 6.8 g/dL (ref 6.3–8.2)
Sodium: 138 MMOL/L (ref 136–145)

## 2007-03-17 LAB — CK: CK: 35 U/L (ref 21–215)

## 2007-03-18 LAB — PROTHROMBIN TIME + INR
INR: 2.2 — ABNORMAL HIGH (ref 0.9–1.2)
Prothrombin time: 20.8 SECS — ABNORMAL HIGH (ref 9.5–11.1)

## 2007-03-19 LAB — PROTHROMBIN TIME + INR
INR: 2.5 — ABNORMAL HIGH (ref 0.9–1.2)
Prothrombin time: 23.5 SECS — ABNORMAL HIGH (ref 9.5–11.1)

## 2007-03-21 LAB — PROTHROMBIN TIME + INR
INR: 3.9 — CR (ref 0.9–1.2)
Prothrombin time: 35.4 SECS — ABNORMAL HIGH (ref 9.5–11.1)

## 2007-03-21 LAB — D DIMER: D DIMER: 0.44 ug/ml(FEU) (ref <1.1)

## 2007-03-21 LAB — C DIFFICILE TOXIN A & B BY EIA: C. difficile toxin A&B: NEGATIVE

## 2007-03-21 LAB — D-DIMER, QUANTITATIVE: D-Dimer, Quant: 0.44 ug/ml(FEU) (ref <1.1)

## 2007-04-20 LAB — URINE MICROSCOPIC
Casts: 0 /LPF
Mucus: 0 /LPF
RBC: >100 /HPF

## 2007-04-20 LAB — HCG URINE, QL. - POC: Pregnancy test,urine (POC): NEGATIVE

## 2007-04-20 MED ORDER — PROMETHAZINE 25 MG/ML INJECTION
25 mg/mL | INTRAMUSCULAR | Status: AC
Start: 2007-04-20 — End: 2007-04-20
  Administered 2007-04-20: 22:00:00 via INTRAVENOUS

## 2007-04-20 MED ORDER — HYDROMORPHONE 2 MG/ML INJECTION SOLUTION
2 mg/mL | INTRAMUSCULAR | Status: AC
Start: 2007-04-20 — End: 2007-04-20
  Administered 2007-04-20: 22:00:00 via INTRAVENOUS

## 2007-04-20 MED FILL — PROMETHAZINE 25 MG/ML INJECTION: 25 mg/mL | INTRAMUSCULAR | Qty: 1

## 2007-04-20 MED FILL — HYDROMORPHONE 2 MG/ML INJECTION SOLUTION: 2 mg/mL | INTRAMUSCULAR | Qty: 1

## 2007-04-20 NOTE — ED Notes (Signed)
Provider at bedside completing assessment

## 2007-04-20 NOTE — ED Provider Notes (Signed)
Flank Pain   [name] This is a recurrent problem. The current episode started 1 to 2 hours ago. The problem has been gradually worsening. The problem occurs constantly. Patient reports no work related injury.The pain is present in the right side. The pain does not radiate.        Past Medical History   Diagnosis Date   ??? Pulmonary Embolism    ??? Factor V (Leiden) Deficiency    ??? Kidney Calculi    ??? Thromboembolus      pulmonary emboli x 15   ??? Cholecystitis with Cholelithiasis    ??? Chronic Kidney Disease      Renal stones   ??? Autoimmune Disease      factor 5 defficiency   ??? Sleep Disorder      problems sleeping   ??? Neurological Disorder      hx migraines   ??? Infectious Disease      MRSA- blood cultures   ??? Other Ill-Defined Conditions      port right subclavian, hx kidney stones          Past Surgical History   Procedure Date   ??? Cystoscopy    ??? Lithotripsy    ??? Hx vascular access      port to right chest s/p 5 years.   ??? Hx appendectomy    ??? Hx gyn      cysts from ovaries, c-section, tubal ligation   ??? Hx tubal ligation    ??? Hx tubal ligation    ??? Hx urological      for stones   ??? Hx heent    ??? Hx tonsillectomy    ??? Hx other surgical            No family history on file.     History   Social History   ??? Marital Status: Legally Separated     Spouse Name: N/A     Number of Children: N/A   ??? Years of Education: N/A   Occupational History   ??? Not on file.   Social History Main Topics   ??? Tobacco Use: Never   ??? Alcohol Use: No   ??? Drug Use: No   ??? Sexually Active: Yes -- Female partner(s)     Birth Control/ Protection: Surgical   Other Topics Concern   ??? Not on file   Social History Narrative   ??? No narrative on file           ALLERGIES: Codeine, Pcn, Prednisone, Ketorolac tromethamine, Shellfish, Iv dye, iodine containing and Influenza virus vaccine      Review of Systems   Genitourinary: Positive for flank pain.   All other systems reviewed and are negative.      Filed Vitals:     04/20/2007  5:17 PM   BP: 107/63   Pulse: 89   Temp: 99.5 ??F (37.5 ??C)   Resp: 17   Height: 5\' 4"  (1.626 m)   SpO2: 97%              Physical Exam   Nursing note and vitals reviewed.  Constitutional: She is oriented. She appears well-developed and well-nourished. She appears not diaphoretic. No distress.   HENT:   Head: Normocephalic and atraumatic.   Right Ear: External ear normal.   Left Ear: External ear normal.   Nose: Nose normal.   Mouth/Throat: Oropharynx is clear and moist. No oropharyngeal exudate.   Eyes: Conjunctivae  and extraocular motions are normal. Pupils are equal, round, and reactive to light. Right eye exhibits no discharge. Left eye exhibits no discharge. No scleral icterus.   Neck: Normal range of motion. Neck supple. No JVD present. No tracheal deviation present. No thyromegaly present.   Cardiovascular: Normal rate, normal heart sounds and intact distal pulses.  Exam reveals no gallop and no friction rub.    No murmur heard.  Pulmonary/Chest: Breath sounds normal. No stridor. No respiratory distress. She has no wheezes. She has no rales. She exhibits no tenderness.   Abdominal: Soft. Bowel sounds are normal. She exhibits no distension and no mass. No tenderness. She has no rebound and no guarding.   Musculoskeletal: Normal range of motion. She exhibits no edema and no tenderness.   Lymphadenopathy:     She has no cervical adenopathy.   Neurological: She is alert and oriented. No cranial nerve deficit. She exhibits normal muscle tone. Coordination normal.   Skin: Skin is warm and dry. No rash noted. She is not diaphoretic. No erythema.   Psychiatric: She has a normal mood and affect. Her behavior is normal.        Coding    The patient was observed in the ED.      Results Reviewed:CT abdomen and pelvis - multiple kidney stone - non-obstructing, NAD      Recent Results (from the past 24 hour(s))   POC HCG,URINE QUAL   Component Value Range   ??? Pregnancy Test,urine negative  -     ??? Pregnancy test,QC    -    URINE MICROSCOPIC    Collection Time 04/20/07  5:35 PM   Component Value Range   ??? WBC 0-3  - (/HPF)   ??? RBC >100  - (/HPF)   ??? Epithelials 0-3  - (/HPF)   ??? Bacteria 1+ (*) - (/HPF)   ??? Casts 0  - (/LPF)   ??? Crystals CA OXALATE  - (/LPF)   ??? Mucus 0  - (/LPF)         I discussed the results of all labs, procedures, radiographs, and treatments with the patient and available family.  Treatment plan is agreed upon and the patient is ready for discharge.  All voiced understanding of the discharge plan and medication instructions or changes as appropriate.  Questions about treatment in the ED were answered.  All were encouraged to return should symptoms worsen or new problems develop.

## 2007-04-20 NOTE — ED Notes (Signed)
Assessment completed by nurse Clean catch urine specimen collected,

## 2007-04-20 NOTE — ED Notes (Signed)
Resting quietly undisturbed with eyes closed no apparent distress noted.

## 2007-04-20 NOTE — ED Notes (Signed)
Bedside report given to Jennifer, RN

## 2007-04-20 NOTE — ED Notes (Signed)
D/c instructions and prescriptions to patient.   Pt called yellow cab for ride home.

## 2007-05-02 LAB — CBC WITH AUTOMATED DIFF
ABS. BASOPHILS: 0.1 10*3/uL (ref 0.0–0.2)
ABS. EOSINOPHILS: 0.2 10*3/uL (ref 0.00–0.80)
ABS. LYMPHOCYTES: 1.4 10*3/uL (ref 0.6–4.3)
ABS. MONOCYTES: 0.4 10*3/uL (ref 0.1–0.9)
ABS. NEUTROPHILS: 2.5 10*3/uL (ref 1.9–7.8)
BASOPHILS: 2 % — ABNORMAL HIGH (ref 0.1–1.6)
EOSINOPHILS: 4 % (ref 0.5–7.8)
HCT: 40.8 % (ref 35.6–45.0)
HGB: 14 g/dL (ref 11.7–15.0)
LYMPHOCYTES: 32 % (ref 14.7–41.3)
MCH: 32.3 PG (ref 26.1–32.9)
MCHC: 34.4 g/dL (ref 31.4–35.0)
MCV: 94 FL (ref 79.6–97.8)
MONOCYTES: 8 % (ref 3.2–9.0)
MPV: 7.6 FL (ref 7.4–10.4)
NEUTROPHILS: 54 % (ref 47.0–74.6)
PLATELET: 377 10*3/uL (ref 140–440)
RBC: 4.34 M/uL (ref 3.86–5.18)
RDW: 14 % (ref 11.9–14.6)
WBC: 4.5 10*3/uL (ref 4.5–10.5)

## 2007-05-02 LAB — URINE MICROSCOPIC
Casts: 0 /LPF
Crystals, urine: 0 /LPF

## 2007-05-02 LAB — METABOLIC PANEL, COMPREHENSIVE
A-G Ratio: 1.3 (ref 1.2–3.5)
ALT (SGPT): 29 U/L (ref 27–62)
AST (SGOT): 13 U/L — ABNORMAL LOW (ref 15–37)
Albumin: 5 g/dL (ref 3.5–5.0)
Alk. phosphatase: 139 U/L — ABNORMAL HIGH (ref 50–136)
Anion gap: 13 (ref 7–16)
BUN: 20 MG/DL — ABNORMAL HIGH (ref 7–18)
Bilirubin, total: 0.4 MG/DL (ref 0.2–1.1)
CO2: 25 MMOL/L (ref 21–32)
Calcium: 10.1 MG/DL (ref 8.4–10.4)
Chloride: 100 MMOL/L (ref 98–107)
Creatinine: 0.8 MG/DL (ref 0.6–1.0)
GFR est AA: >60 mL/min/{1.73_m2} (ref >60)
GFR est non-AA: >60 mL/min/{1.73_m2} (ref >60)
Globulin: 3.8 g/dL — ABNORMAL HIGH (ref 2.3–3.5)
Glucose: 99 MG/DL (ref 74–106)
Potassium: 4 MMOL/L (ref 3.5–5.1)
Protein, total: 8.8 g/dL — ABNORMAL HIGH (ref 6.3–8.2)
Sodium: 138 MMOL/L (ref 136–145)

## 2007-05-02 LAB — LIPASE: Lipase: 211 U/L (ref 114–286)

## 2007-05-02 LAB — PROTHROMBIN TIME + INR
INR: 1 (ref 0.9–1.2)
Prothrombin time: 10.7 SECS (ref 9.5–12.0)

## 2007-05-02 MED ORDER — SODIUM CHLORIDE 0.9% BOLUS IV
0.9 % | Freq: Once | INTRAVENOUS | Status: DC
Start: 2007-05-02 — End: 2007-05-02
  Administered 2007-05-02: 19:00:00 via INTRAVENOUS

## 2007-05-02 MED ORDER — SALINE PERIPHERAL FLUSH Q8H
Freq: Three times a day (TID) | INTRAMUSCULAR | Status: DC
Start: 2007-05-02 — End: 2007-05-02

## 2007-05-02 MED ORDER — ONDANSETRON (PF) 4 MG/2 ML INJECTION
4 mg/2 mL | INTRAMUSCULAR | Status: AC
Start: 2007-05-02 — End: 2007-05-02

## 2007-05-02 MED ORDER — SALINE PERIPHERAL FLUSH PRN
INTRAMUSCULAR | Status: DC | PRN
Start: 2007-05-02 — End: 2007-05-02

## 2007-05-02 MED ORDER — HYDROMORPHONE 2 MG/ML INJECTION SOLUTION
2 mg/mL | INTRAMUSCULAR | Status: AC
Start: 2007-05-02 — End: 2007-05-02
  Administered 2007-05-02: 19:00:00 via INTRAVENOUS

## 2007-05-02 MED ADMIN — ondansetron (ZOFRAN) 4 mg/2 mL injection: @ 19:00:00 | NDC 24200015844

## 2007-05-02 MED FILL — ONDANSETRON (PF) 4 MG/2 ML INJECTION: 4 mg/2 mL | INTRAMUSCULAR | Qty: 2

## 2007-05-02 MED FILL — HYDROMORPHONE 2 MG/ML INJECTION SOLUTION: 2 mg/mL | INTRAMUSCULAR | Qty: 1

## 2007-05-02 NOTE — ED Notes (Signed)
The patient was observed in the ED.    Results Reviewed:      Recent Results (from the past 24 hour(s))   URINE MICROSCOPIC    Collection Time 05/02/07  1:35 PM   Component Value Range   ??? WBC 5-10  - (/HPF)   ??? RBC 0-3  - (/HPF)   ??? Epithelials 3-5  - (/HPF)   ??? Bacteria 1+ (*) - (/HPF)   ??? Casts 0  - (/LPF)   ??? Crystals 0  - (/LPF)   ??? Mucus 2+ (*) - (/LPF)   METABOLIC PANEL, COMPREHENSIVE    Collection Time 05/02/07  2:27 PM   Component Value Range   ??? SODIUM 138  136-145 (MMOL/L)   ??? POTASSIUM 4.0  3.5-5.1 (MMOL/L)   ??? CHLORIDE 100  98-107 (MMOL/L)   ??? CO2 25  21-32 (MMOL/L)   ??? ANION GAP 13  7-16    ??? GLUCOSE 99  74-106 (MG/DL)   ??? BUN 20 (*) 1-47 (MG/DL)   ??? CREATININE 0.8  0.6-1.0 (MG/DL)   ??? GFR est AA >60  - (ml/min/1.42m2)   ??? GFR est non-AA >60  - (ml/min/1.53m2)   ??? CALCIUM 10.1  8.4-10.4 (MG/DL)   ??? BILIRUBIN,TOTAL 0.4  0.2-1.1 (MG/DL)   ??? ALT 29  27-62 (U/L)   ??? AST 13 (*) 15-37 (U/L)   ??? ALK PHOS 139 (*) 50-136 (U/L)   ??? PROTEIN,TOTAL 8.8 (*) 6.3-8.2 (g/dL)   ??? ALBUMIN 5.0  3.5-5.0 (g/dL)   ??? GLOBULIN 3.8 (*) 2.3-3.5 (g/dL)   ??? A-G RATIO 1.3  1.2-3.5 ( )   LIPASE    Collection Time 05/02/07  2:27 PM   Component Value Range   ??? LIPASE 211  114-286 (U/L)   CBC W/ AUTOMATED DIFF    Collection Time 05/02/07  2:27 PM   Component Value Range   ??? WBC 4.5  4.5-10.5 (K/uL)   ??? RBC 4.34  3.86-5.18 (M/uL)   ??? HGB 14.0  11.7-15.0 (g/dL)   ??? HCT 40.8  35.6-45.0 (%)   ??? MCV 94.0  79.6-97.8 (FL)   ??? MCH 32.3  26.1-32.9 (PG)   ??? MCHC 34.4  31.4-35.0 (g/dL)   ??? RDW 14.0  11.9-14.6 (%)   ??? PLATELET 377  140-440 (K/uL)   ??? MPV 7.6  7.4-10.4 (FL)   ??? NEUTROPHILS 54  47.0-74.6 (%)   ??? LYMPHOCYTES 32  14.7-41.3 (%)   ??? MONOCYTES 8  3.2-9.0 (%)   ??? EOSINOPHILS 4  0.5-7.8 (%)   ??? BASOPHILS 2 (*) 0.1-1.6 (%)   ??? ABSOLUTE NEUTS 2.5  1.9-7.8 (K/UL)   ??? ABSOLUTE LYMPHS 1.4  0.6-4.3 (K/UL)   ??? ABSOLUTE MONOS 0.4  0.1-0.9 (K/UL)   ??? ABSOLUTE EOS 0.2  0.00-0.80 (K/UL)   ??? ABSOLUTE BASOS 0.1  0.0-0.2 (K/UL)   ??? DF AUTOMATED DIFF  -     PROTHROMBIN TIME W/ INR    Collection Time 05/02/07  2:27 PM   Component Value Range   ??? Prothrombin Time-PT 10.7  9.5-12.0 (SECS)   ??? INR 1.0  0.9-1.2          I discussed the results of all labs, procedures, radiographs, and treatments with the patient and available family.  Treatment plan is agreed upon and the patient is ready for discharge.  All voiced understanding of the discharge plan and medication instructions or changes as appropriate.  Questions about treatment in the ED were answered.  All were encouraged  to return should symptoms worsen or new problems develop.

## 2007-05-02 NOTE — ED Provider Notes (Signed)
Abdominal Pain   [name] This is a new problem. The current episode started 2 days ago. The problem occurs constantly. The problem has been gradually worsening. The pain is associated with vomiting. The pain is at a severity of 8/10. Associated symptoms include nausea, vomiting and back pain. Pertinent negatives include no fever, no diarrhea, no melena, no dysuria, no frequency and no hematuria. The pain is worsened by palpation and deep breathing. The pain is relieved by nothing. Her past medical history does not include pancreatitis or diverticulitis. factor 5 deficiency        Past Medical History   Diagnosis Date   ??? Pulmonary Embolism    ??? Factor V (Leiden) Deficiency    ??? Kidney Calculi    ??? Thromboembolus      pulmonary emboli x 15   ??? Cholecystitis with Cholelithiasis    ??? Chronic Kidney Disease      Renal stones   ??? Autoimmune Disease      factor 5 defficiency   ??? Sleep Disorder      problems sleeping   ??? Neurological Disorder      hx migraines   ??? Infectious Disease      MRSA- blood cultures   ??? Other Ill-Defined Conditions      port right subclavian, hx kidney stones          Past Surgical History   Procedure Date   ??? Cystoscopy    ??? Lithotripsy    ??? Hx vascular access      port to right chest s/p 5 years.   ??? Hx appendectomy    ??? Hx gyn      cysts from ovaries, c-section, tubal ligation   ??? Hx tubal ligation    ??? Hx tubal ligation    ??? Hx urological      for stones   ??? Hx heent    ??? Hx tonsillectomy    ??? Hx other surgical            No family history on file.     History   Social History   ??? Marital Status: Legally Separated     Spouse Name: N/A     Number of Children: N/A   ??? Years of Education: N/A   Occupational History   ??? Not on file.   Social History Main Topics   ??? Tobacco Use: Never   ??? Alcohol Use: No   ??? Drug Use: No   ??? Sexually Active: Yes -- Female partner(s)     Birth Control/ Protection: Surgical   Other Topics Concern   ??? Not on file    Social History Narrative   ??? No narrative on file           ALLERGIES: Codeine, Pcn, Prednisone, Ketorolac tromethamine, Shellfish, Iv dye, iodine containing and Influenza virus vaccine      Review of Systems   Constitutional: Negative for fever, chills and malaise/fatigue.   Gastrointestinal: Positive for nausea, vomiting and abdominal pain. Negative for diarrhea and melena.   Genitourinary: Negative for dysuria, frequency and hematuria.   Musculoskeletal: Positive for back pain.   All other systems reviewed and are negative.      Filed Vitals:    05/02/2007  1:18 PM 05/02/2007  1:19 PM   BP: 100/85    Pulse: 82    Temp: 98.4 ??F (36.9 ??C)    Resp: 16    Height:  5'  4" (1.626 m)   Weight:  138 lb (62.596 kg)   SpO2: 99%               Physical Exam   Nursing note and vitals reviewed.  Constitutional: She is oriented. She appears well-developed and well-nourished.   HENT:   Head: Normocephalic and atraumatic.   Right Ear: External ear normal.   Left Ear: External ear normal.   Nose: Nose normal.   Mouth/Throat: Oropharynx is clear and moist.   Eyes: Conjunctivae are normal. Pupils are equal, round, and reactive to light.   Neck: Normal range of motion. Neck supple.   Cardiovascular: Normal rate, regular rhythm and normal heart sounds.    Pulmonary/Chest: Effort normal and breath sounds normal.   Abdominal: Soft. Bowel sounds are normal. She exhibits no distension. Tenderness is present in the right upper quadrant. She has guarding and CVA tenderness. She has no rebound. No hernia.   Musculoskeletal: Normal range of motion.   Neurological: She is alert and oriented.   Skin: Skin is warm and dry. No rash noted.   Psychiatric: She has a normal mood and affect. Her behavior is normal.        MDM Coding   Reviewed: previous chart, nursing note and vitals  Reviewed previous: labs  Interpretation: ultrasound and labs

## 2007-05-02 NOTE — ED Notes (Signed)
IVF would not flow through IV site.  IV flushed, patent, site without swelling/drainage.  Redness R hand has resolved.  MD aware, OK to discontinue NS IVF order.

## 2007-05-02 NOTE — ED Notes (Signed)
Patient's R hand became red while giving IV Zofran push.  Patient states this is normal when getting IV meds.  Will continue to monitor.

## 2007-05-02 NOTE — ED Notes (Signed)
Dc with dc instructtions; reviewed with pt; sts understanding.

## 2007-05-10 MED ORDER — HYDROMORPHONE 2 MG/ML INJECTION SOLUTION
2 mg/mL | INTRAMUSCULAR | Status: AC
Start: 2007-05-10 — End: 2007-05-10
  Administered 2007-05-10: 17:00:00 via INTRAMUSCULAR

## 2007-05-10 MED ORDER — PROMETHAZINE 50 MG/ML INJECTION
50 mg/mL | INTRAMUSCULAR | Status: AC
Start: 2007-05-10 — End: 2007-05-10
  Administered 2007-05-10: 17:00:00 via INTRAMUSCULAR

## 2007-05-10 MED ORDER — HYDROMORPHONE 2 MG/ML INJECTION SOLUTION
2 mg/mL | INTRAMUSCULAR | Status: AC
Start: 2007-05-10 — End: 2007-05-10
  Administered 2007-05-10: 18:00:00 via INTRAMUSCULAR

## 2007-05-10 MED FILL — HYDROMORPHONE 2 MG/ML INJECTION SOLUTION: 2 mg/mL | INTRAMUSCULAR | Qty: 1

## 2007-05-10 MED FILL — PROMETHAZINE 50 MG/ML INJECTION: 50 mg/mL | INTRAMUSCULAR | Qty: 1

## 2007-05-10 NOTE — ED Notes (Signed)
sts decreased nausea; no change in pain.

## 2007-05-10 NOTE — ED Notes (Signed)
I have reviewed discharge instructions with the patient.  The patient verbalized understanding.

## 2007-05-10 NOTE — ED Provider Notes (Signed)
Headache   [name] This is a new problem. The current episode started more than 2 days ago. The problem occurs constantly. The problem has not changed since onset. The headache is associated with bright light. The pain is located in the left unilateral region. The pain is at a severity of 8/10. The pain is moderate. Associated symptoms include nausea. Pertinent negatives include no anorexia, no fever, no malaise/fatigue, no chest pressure, no near-syncope, no orthopnea, no palpitations, no syncope, no shortness of breath, no weakness, no tingling, no dizziness, no visual change and no vomiting. She has tried triptan therapy for the symptoms. The treatment provided no relief.        Past Medical History   Diagnosis Date   ??? Pulmonary Embolism    ??? Factor V (Leiden) Deficiency    ??? Kidney Calculi    ??? Thromboembolus      pulmonary emboli x 15   ??? Cholecystitis with Cholelithiasis    ??? Chronic Kidney Disease      Renal stones   ??? Autoimmune Disease      factor 5 defficiency   ??? Sleep Disorder      problems sleeping   ??? Neurological Disorder      hx migraines   ??? Infectious Disease      MRSA- blood cultures   ??? Other Ill-Defined Conditions      port right subclavian, hx kidney stones          Past Surgical History   Procedure Date   ??? Cystoscopy    ??? Lithotripsy    ??? Hx vascular access      port to right chest s/p 5 years.   ??? Hx appendectomy    ??? Hx gyn      cysts from ovaries, c-section, tubal ligation   ??? Hx tubal ligation    ??? Hx tubal ligation    ??? Hx urological      for stones   ??? Hx heent    ??? Hx tonsillectomy    ??? Hx other surgical            No family history on file.     History   Social History   ??? Marital Status: Legally Separated     Spouse Name: N/A     Number of Children: N/A   ??? Years of Education: N/A   Occupational History   ??? Not on file.   Social History Main Topics   ??? Tobacco Use: Never   ??? Alcohol Use: No   ??? Drug Use: No    ??? Sexually Active: Yes -- Female partner(s)     Birth Control/ Protection: Surgical   Other Topics Concern   ??? Not on file   Social History Narrative   ??? No narrative on file           ALLERGIES: Codeine, Pcn, Prednisone, Ketorolac tromethamine, Shellfish, Iv dye, iodine containing and Influenza virus vaccine      Review of Systems   Constitutional: Negative.  Negative for fever and malaise/fatigue.   Eyes: Negative.    Respiratory: Negative.  Negative for shortness of breath.    Cardiovascular: Negative.  Negative for palpitations, orthopnea and syncope.   Gastrointestinal: Positive for nausea. Negative for vomiting.   Genitourinary: Negative.    Musculoskeletal: Negative.    Skin: Negative.    Neurological: Negative.  Negative for dizziness, tingling and weakness.  Filed Vitals:    05/10/2007 12:39 PM   BP: 134/88   Pulse: 104   Temp: 96.7 ??F (35.9 ??C)   Resp: 20   Height: 5\' 4"  (1.626 m)   SpO2: 99%              Physical Exam   Constitutional: She is oriented. She appears well-developed and well-nourished.   HENT:   Head: Normocephalic.   Mouth/Throat: Oropharynx is clear and moist. No oropharyngeal exudate.   Eyes: Pupils are equal, round, and reactive to light.   Neck: Normal range of motion.   Cardiovascular: Normal rate.    Pulmonary/Chest: Effort normal and breath sounds normal.   Abdominal: Soft. She exhibits no distension. No tenderness.   Musculoskeletal: Normal range of motion.   Neurological: She is alert and oriented.   Skin: Skin is warm and dry.        Coding

## 2007-05-10 NOTE — ED Notes (Signed)
sts no relief in pain. sts "ride is on the way."

## 2007-05-11 LAB — DRUG SCREEN, URINE
ACETAMINOPHEN: NEGATIVE
AMPHETAMINES: NEGATIVE
BARBITURATES: NEGATIVE
BENZODIAZEPINES: NEGATIVE
COCAINE: NEGATIVE
METHADONE: NEGATIVE
Methamphetamines: NEGATIVE
OPIATES: NEGATIVE
PCP(PHENCYCLIDINE): NEGATIVE
THC (TH-CANNABINOL): NEGATIVE
TRICYCLICS: POSITIVE

## 2007-05-11 LAB — HCG URINE, QL. - POC: Pregnancy test,urine (POC): NEGATIVE

## 2007-05-11 MED ORDER — SODIUM CHLORIDE 0.9% BOLUS IV
0.9 % | Freq: Once | INTRAVENOUS | Status: DC
Start: 2007-05-11 — End: 2007-05-11

## 2007-05-11 MED ORDER — ONDANSETRON 8 MG TAB, RAPID DISSOLVE
8 mg | ORAL | Status: AC
Start: 2007-05-11 — End: 2007-05-11
  Administered 2007-05-11: 19:00:00 via ORAL

## 2007-05-11 MED ORDER — HYDROMORPHONE 2 MG/ML INJECTION SOLUTION
2 mg/mL | INTRAMUSCULAR | Status: DC
Start: 2007-05-11 — End: 2007-05-11

## 2007-05-11 MED ORDER — HYDROMORPHONE 2 MG/ML INJECTION SOLUTION
2 mg/mL | INTRAMUSCULAR | Status: AC
Start: 2007-05-11 — End: 2007-05-11
  Administered 2007-05-11: 19:00:00 via INTRAMUSCULAR

## 2007-05-11 MED ORDER — DEXAMETHASONE SODIUM PHOSPHATE 10 MG/ML IJ SOLN
10 mg/mL | INTRAMUSCULAR | Status: AC
Start: 2007-05-11 — End: 2007-05-11
  Administered 2007-05-11: 21:00:00 via INTRAMUSCULAR

## 2007-05-11 MED ORDER — ONDANSETRON (PF) 4 MG/2 ML INJECTION
4 mg/2 mL | INTRAMUSCULAR | Status: DC
Start: 2007-05-11 — End: 2007-05-11

## 2007-05-11 MED FILL — HYDROMORPHONE 2 MG/ML INJECTION SOLUTION: 2 mg/mL | INTRAMUSCULAR | Qty: 1

## 2007-05-11 MED FILL — ONDANSETRON 8 MG TAB, RAPID DISSOLVE: 8 mg | ORAL | Qty: 1

## 2007-05-11 MED FILL — DEXAMETHASONE SODIUM PHOSPHATE 10 MG/ML IJ SOLN: 10 mg/mL | INTRAMUSCULAR | Qty: 1

## 2007-05-11 NOTE — ED Notes (Signed)
Back from CT, voices c/o headache, informed Jason,PA states"inform patient awaiting CT results", patient updated.

## 2007-05-11 NOTE — ED Notes (Signed)
Negative pregnancy urine test noted.

## 2007-05-11 NOTE — Progress Notes (Signed)
Quick Note:    treated  ______

## 2007-05-11 NOTE — ED Notes (Signed)
Nursing assessment completed, patient states," was seen here yesterday they gave me prescription for flexeril but I did not get it filled cause don't think it would help a headache."

## 2007-05-11 NOTE — ED Provider Notes (Signed)
Headache   [name] This is a recurrent problem. The current episode started yesterday. The problem occurs constantly. The problem has been gradually worsening. The headache is associated with bright light. The pain is located in the left unilateral region. The pain quality is described as throbbing. The pain is worst headache ever. Associated symptoms include near-syncope and nausea. Pertinent negatives include no anorexia, no fever, no malaise/fatigue, no chest pressure, no orthopnea, no palpitations, no syncope, no shortness of breath, no weakness, no tingling, no dizziness, no visual change and no vomiting. She has tried oral narcotic analgesics for the symptoms. The treatment provided no relief.        Past Medical History   Diagnosis Date   ??? Pulmonary Embolism    ??? Factor V (Leiden) Deficiency    ??? Kidney Calculi    ??? Thromboembolus      pulmonary emboli x 15   ??? Cholecystitis with Cholelithiasis    ??? Chronic Kidney Disease      Renal stones   ??? Autoimmune Disease      factor 5 defficiency   ??? Sleep Disorder      problems sleeping   ??? Neurological Disorder      hx migraines   ??? Infectious Disease      MRSA- blood cultures   ??? Other Ill-Defined Conditions      port right subclavian, hx kidney stones          Past Surgical History   Procedure Date   ??? Cystoscopy    ??? Lithotripsy    ??? Hx vascular access      port to right chest s/p 5 years.   ??? Hx appendectomy    ??? Hx gyn      cysts from ovaries, c-section, tubal ligation   ??? Hx tubal ligation    ??? Hx tubal ligation    ??? Hx urological      for stones   ??? Hx heent    ??? Hx tonsillectomy    ??? Hx other surgical            No family history on file.     History   Social History   ??? Marital Status: Unknown     Spouse Name: N/A     Number of Children: N/A   ??? Years of Education: N/A   Occupational History   ??? Not on file.   Social History Main Topics   ??? Tobacco Use: Never   ??? Alcohol Use: No   ??? Drug Use: No    ??? Sexually Active: Yes -- Female partner(s)     Birth Control/ Protection: Surgical   Other Topics Concern   ??? Not on file   Social History Narrative   ??? No narrative on file           ALLERGIES: Codeine, Pcn, Prednisone, Ketorolac tromethamine, Shellfish, Iv dye, iodine containing and Influenza virus vaccine      Review of Systems   Constitutional: Negative.  Negative for fever and malaise/fatigue.   Eyes: Negative.    Respiratory: Negative.  Negative for shortness of breath.    Cardiovascular: Negative.  Negative for palpitations, orthopnea and syncope.   Gastrointestinal: Positive for nausea. Negative for vomiting.   Genitourinary: Negative.    Musculoskeletal: Negative.    Skin: Negative.    Neurological: Negative.  Negative for dizziness, tingling and weakness.   Psychiatric/Behavioral: Negative.  Filed Vitals:    05/11/2007 12:31 PM   BP: 133/72   Pulse: 82   Temp: 98.4 ??F (36.9 ??C)   Resp: 18   Height: 5\' 4"  (1.626 m)   Weight: 141 lb (63.957 kg)   SpO2: 95%              Physical Exam   Constitutional: She is oriented. She appears well-developed and well-nourished.   HENT:   Head: Normocephalic.   Mouth/Throat: Oropharynx is clear and moist.   Eyes: Pupils are equal, round, and reactive to light.   Neck: Normal range of motion.   Pulmonary/Chest: Effort normal. No respiratory distress.   Musculoskeletal: Normal range of motion.   Neurological: She is alert and oriented.   Skin: Skin is warm and dry.        Coding

## 2007-05-11 NOTE — ED Notes (Addendum)
Discharge instructions reviewed verbal and written. Instructed to follow up with primary care physician if symptoms worsen or new onset of symptoms appear. Verbalizes understanding by stating, "Okay I understand". Barbara Cower, PA instructed patient to get Rx given yesterday for flexeril filled and begin taking it, patient states, "okay I will." Informed will be ready for discharge in 15 minutes, patient given phone to call for ride home. May be dc'd at 1710 due to shot time.

## 2007-05-12 LAB — CULTURE & SMEAR, AFB

## 2007-05-20 LAB — PROTHROMBIN TIME + INR
INR: 2.9 — ABNORMAL HIGH (ref 0.9–1.2)
Prothrombin time: 26.5 SECS — ABNORMAL HIGH (ref 9.5–11.1)

## 2007-05-20 LAB — CREATININE: Creatinine: 0.7 MG/DL (ref 0.6–1.0)

## 2007-05-20 LAB — PTT: aPTT: 44 s — ABNORMAL HIGH (ref 23.5–31.7)

## 2007-05-20 MED ORDER — PROPOXYPHENE N-ACETAMINOPHEN 100 MG-650 MG TAB
100-650 mg | ORAL | Status: DC
Start: 2007-05-20 — End: 2007-05-20

## 2007-05-20 NOTE — ED Notes (Signed)
Patient given warm blanket..

## 2007-05-20 NOTE — ED Notes (Signed)
INR 3.1 3/21

## 2007-05-20 NOTE — ED Notes (Signed)
Recent Results (from the past 24 hour(s))   PROTHROMBIN TIME W/ INR    Collection Time 05/20/07  1:20 PM   Component Value Range   ??? Prothrombin Time-PT 26.5 (*) 9.5-11.1 (SECS)   ??? INR 2.9 (*) 0.9-1.2    PTT    Collection Time 05/20/07  1:20 PM   Component Value Range   ??? APTT 44.0 (*) 23.5-31.7 (SEC)   CREATININE    Collection Time 05/20/07  1:20 PM   Component Value Range   ??? CREATININE 0.7  0.6-1.0 (MG/DL)         VQ scan =  No interval change from 2 most recent VQ scans    CXR =  NAD

## 2007-05-20 NOTE — ED Notes (Signed)
Discharge instructions discussed by Ninetta Lights, RN with verbal understanding, E-sig pad not retaining signatures, physical signature obtained by RN.

## 2007-05-20 NOTE — ED Notes (Signed)
Results reviewed.  Patient verbalizes understanding of and is agreeable to discharge plan of care.

## 2007-05-20 NOTE — ED Notes (Signed)
Lab contacted.  Herb RN attempted Iv x 2.  Unsuccessful

## 2007-05-20 NOTE — ED Provider Notes (Signed)
HPI Comments: Presents w/ abrupt intermittent "stabbing" left chest pain that awakened her from sleep.  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   Slight dry cough.  No fever, n/v/d.    Review of prior ED visits and admissions reveals mult visits for pain complaints, frequent noncompliance and leaving AMA as well as drug-seeking behavior.    Chest Pain   [name] Associated symptoms include cough and shortness of breath. Pertinent negatives include no fever, no malaise/fatigue, no orthopnea, no palpitations, no abdominal pain, no nausea, no vomiting, no headaches and no dizziness.        Past Medical History   Diagnosis Date   ??? Pulmonary Embolism    ??? Factor V (Leiden) Deficiency    ??? Kidney Calculi    ??? Thromboembolus      pulmonary emboli x 15   ??? Cholecystitis with Cholelithiasis    ??? Chronic Kidney Disease      Renal stones   ??? Autoimmune Disease      factor 5 defficiency   ??? Sleep Disorder      problems sleeping   ??? Neurological Disorder      hx migraines   ??? Infectious Disease      MRSA- blood cultures   ??? Other Ill-Defined Conditions      port right subclavian, hx kidney stones          Past Surgical History   Procedure Date   ??? Cystoscopy    ??? Lithotripsy    ??? Hx vascular access      port to right chest s/p 5 years.   ??? Hx appendectomy    ??? Hx gyn      cysts from ovaries, c-section, tubal ligation   ??? Hx tubal ligation    ??? Hx tubal ligation    ??? Hx urological      for stones   ??? Hx heent    ??? Hx tonsillectomy    ??? Hx other surgical            No family history on file.     History   Social History   ??? Marital Status: Divorced     Spouse Name: N/A     Number of Children: N/A   ??? Years of Education: N/A   Occupational History   ??? Not on file.   Social History Main Topics   ??? Tobacco Use: Never   ??? Alcohol Use: No   ??? Drug Use: No   ??? Sexually Active: Yes -- Female partner(s)     Birth Control/ Protection: Surgical    Other Topics Concern   ??? Not on file   Social History Narrative   ??? No narrative on file           ALLERGIES: Codeine, Pcn, Prednisone, Ketorolac tromethamine, Shellfish, Iv dye, iodine containing, Influenza virus vaccine and Pneumovax 23      Review of Systems   Constitutional: Negative for fever, chills and malaise/fatigue.   Eyes: Negative for blurred vision.   Respiratory: Positive for cough and shortness of breath.    Cardiovascular: Positive for chest pain. Negative for palpitations, orthopnea and leg swelling.   Gastrointestinal: Negative for nausea, vomiting and abdominal pain.   Musculoskeletal: Negative for myalgias.   Neurological: Negative for dizziness and headaches.   All other systems reviewed and are negative.      Filed Vitals:    05/20/2007  11:58 AM 05/20/2007 12:20 PM   BP: 130/90 144/53   Pulse: 86    Temp: 99.3 ??F (37.4 ??C)    Resp: 16    Height: 5\' 4"  (1.626 m)    Weight: 141 lb (63.957 kg)    SpO2: 98%               Physical Exam   Nursing note and vitals reviewed.  Constitutional: She is oriented. She appears well-developed. No distress.   HENT:   Head: Normocephalic.   Mouth/Throat: Oropharynx is clear and moist.   Eyes: Pupils are equal, round, and reactive to light.   Neck: Normal range of motion. Neck supple.   Cardiovascular: Normal rate and normal heart sounds.    Pulmonary/Chest: Effort normal and breath sounds normal. No respiratory distress. She exhibits no tenderness.   Abdominal: Soft. No tenderness.   Musculoskeletal: She exhibits no edema and no tenderness.   Neurological: She is alert and oriented.   Skin: Skin is warm and dry.   Psychiatric: She has a normal mood and affect.        MDM Coding   Reviewed: previous chart  Interpretation: labs and CT scan

## 2007-05-20 NOTE — ED Notes (Signed)
Pt states  Unable to have CT due to allergy of dye. Dr Thornell Sartorius notified

## 2007-05-26 LAB — CULTURE & SMEAR, AFB: Final Result:: NEGATIVE

## 2007-06-03 LAB — URINE MICROSCOPIC
Crystals, urine: 0 /LPF
Mucus: 0 /LPF

## 2007-06-03 MED ORDER — HYDROMORPHONE 2 MG/ML INJECTION SOLUTION
2 mg/mL | INTRAMUSCULAR | Status: AC
Start: 2007-06-03 — End: 2007-06-03
  Administered 2007-06-03: 23:00:00 via INTRAMUSCULAR

## 2007-06-03 MED ORDER — HYDROMORPHONE 2 MG/ML INJECTION SOLUTION
2 mg/mL | INTRAMUSCULAR | Status: DC
Start: 2007-06-03 — End: 2007-06-03

## 2007-06-03 MED ORDER — PROMETHAZINE 25 MG/ML INJECTION
25 mg/mL | INTRAMUSCULAR | Status: AC
Start: 2007-06-03 — End: 2007-06-03
  Administered 2007-06-03: 23:00:00 via INTRAMUSCULAR

## 2007-06-03 MED FILL — PROMETHAZINE 25 MG/ML INJECTION: 25 mg/mL | INTRAMUSCULAR | Qty: 1

## 2007-06-03 MED FILL — HYDROMORPHONE 2 MG/ML INJECTION SOLUTION: 2 mg/mL | INTRAMUSCULAR | Qty: 1

## 2007-06-03 NOTE — ED Notes (Signed)
Pt discharged ambulatory to home. Pt teaching rt discharge and followup instructions. Pt voiced understanding.

## 2007-06-03 NOTE — ED Notes (Signed)
Pt oob to bathroom.

## 2007-06-03 NOTE — ED Notes (Signed)
[place]: no hydro.

## 2007-06-03 NOTE — ED Notes (Signed)
Pt has no ride att this time. States they will come and get her. NP made aware . Will await ride for injection. Plan U?S.

## 2007-06-03 NOTE — ED Provider Notes (Signed)
Flank Pain   [name] This is a recurrent problem. The current episode started 6 to 12 hours ago. The problem has been rapidly worsening. The pain is present in the left side. The pain is moderate. Associated symptoms include dysuria. Pertinent negatives include no abdominal pain. She has tried nothing for the symptoms. Risk factors include history of kidney stones.        Past Medical History   Diagnosis Date   ??? Pulmonary Embolism    ??? Factor V (Leiden) Deficiency    ??? Kidney Calculi    ??? Thromboembolus      pulmonary emboli x 15   ??? Cholecystitis with Cholelithiasis    ??? Chronic Kidney Disease      Renal stones   ??? Autoimmune Disease      factor 5 defficiency   ??? Sleep Disorder      problems sleeping   ??? Neurological Disorder      hx migraines   ??? Infectious Disease      MRSA- blood cultures   ??? Other Ill-Defined Conditions      port right subclavian, hx kidney stones          Past Surgical History   Procedure Date   ??? Cystoscopy    ??? Lithotripsy    ??? Hx vascular access      port to right chest s/p 5 years.   ??? Hx appendectomy    ??? Hx gyn      cysts from ovaries, c-section, tubal ligation   ??? Hx tubal ligation    ??? Hx tubal ligation    ??? Hx urological      for stones   ??? Hx heent    ??? Hx tonsillectomy    ??? Hx other surgical            No family history on file.     History   Social History   ??? Marital Status: Divorced     Spouse Name: N/A     Number of Children: N/A   ??? Years of Education: N/A   Occupational History   ??? Not on file.   Social History Main Topics   ??? Tobacco Use: Never   ??? Alcohol Use: No   ??? Drug Use: No   ??? Sexually Active: Yes -- Female partner(s)     Birth Control/ Protection: Surgical   Other Topics Concern   ??? Not on file   Social History Narrative   ??? No narrative on file           ALLERGIES: Codeine, Pcn, Prednisone, Ketorolac tromethamine, Shellfish, Iv dye, iodine containing, Influenza virus vaccine and Pneumovax 23      Review of Systems    Gastrointestinal: Positive for nausea and vomiting. Negative for abdominal pain.   Genitourinary: Positive for dysuria and flank pain. Negative for hematuria.   Musculoskeletal: Positive for back pain.   All other systems reviewed and are negative.      Filed Vitals:    06/03/2007  5:09 PM   BP: 142/78   Pulse: 87   Temp: 96.2 ??F (35.7 ??C)   Resp: 18   Height: 5\' 4"  (1.626 m)   Weight: 142 lb (64.411 kg)              Physical Exam   Nursing note and vitals reviewed.  Constitutional: She is oriented. She appears well-developed and well-nourished. She appears not diaphoretic. No distress.  HENT:   Head: Normocephalic and atraumatic.   Nose: Nose normal.   Mouth/Throat: Oropharynx is clear and moist.   Eyes: Conjunctivae and extraocular motions are normal. Pupils are equal, round, and reactive to light.   Neck: Normal range of motion. Neck supple.   Cardiovascular: Normal rate, regular rhythm and normal heart sounds.    Pulmonary/Chest: Effort normal and breath sounds normal.   Abdominal: Soft. No tenderness. She has no rebound and no guarding.   Musculoskeletal: Normal range of motion. She exhibits no edema and no tenderness.   Neurological: She is alert and oriented.   Skin: Skin is warm and dry. She is not diaphoretic.        MDM Coding   Reviewed: previous chart  Reviewed previous: labs and CT scan  Interpretation: ultrasound

## 2007-06-03 NOTE — ED Notes (Signed)
Pt is back in the room from [employer], IM Med given.

## 2007-06-03 NOTE — ED Notes (Signed)
np exam.

## 2007-06-03 NOTE — ED Notes (Signed)
Pt to u/s via wc.

## 2007-06-10 NOTE — ED Notes (Signed)
I have reviewed the chart and agree with the midlevel provider's course of treatment.  Vilda Zollner Louis Jamonta Goerner, MD

## 2007-06-12 MED ORDER — MEPERIDINE (PF) 50 MG/ML INJ SOLN
50 mg/ml | INTRAMUSCULAR | Status: AC
Start: 2007-06-12 — End: 2007-06-12
  Administered 2007-06-12: 15:00:00 via INTRAMUSCULAR

## 2007-06-12 MED ORDER — ONDANSETRON (PF) 4 MG/2 ML INJECTION
4 mg/2 mL | INTRAMUSCULAR | Status: AC
Start: 2007-06-12 — End: 2007-06-12
  Administered 2007-06-12: 15:00:00 via INTRAMUSCULAR

## 2007-06-12 MED FILL — DEMEROL (PF) 50 MG/ML INJECTION SYRINGE: 50 mg/mL | INTRAMUSCULAR | Qty: 1

## 2007-06-12 MED FILL — ONDANSETRON (PF) 4 MG/2 ML INJECTION: 4 mg/2 mL | INTRAMUSCULAR | Qty: 2

## 2007-06-12 NOTE — ED Notes (Signed)
Patient reassessed after administration of pain medicine. PT states pain has come down to  6/10. Copy of discharge instructions given to patient. Denies any questions at this time.

## 2007-06-12 NOTE — ED Notes (Signed)
PT requesting need warm blanket (given)

## 2007-06-12 NOTE — ED Notes (Signed)
Pt moved to "Shot  Time" room waiting for DC orders

## 2007-06-12 NOTE — ED Provider Notes (Signed)
HPI Comments: 42 y.o. female presents with 2nd severe migraine this yr. She has 2 d hx rt temporal HA with vomiting. No relief with sev Imitrex and Midrin tabs. No neck stiffness. N/V intact.    Headache   [name] This is a new problem. The current episode started 2 days ago. The problem occurs constantly. The problem has been gradually worsening. The headache is associated with photophobia and vomiting. The pain is located in the right unilateral region. The pain quality is described as throbbing. The pain is at a severity of 7/10. The pain is moderate. Associated symptoms include nausea and vomiting. Pertinent negatives include no fever and no shortness of breath. She has tried triptan therapy for the symptoms. The treatment provided no relief.        Past Medical History   Diagnosis Date   ??? Pulmonary Embolism    ??? Factor V (Leiden) Deficiency    ??? Kidney Calculi    ??? Thromboembolus      pulmonary emboli x 15   ??? Cholecystitis with Cholelithiasis    ??? Chronic Kidney Disease      Renal stones   ??? Autoimmune Disease      factor 5 defficiency   ??? Sleep Disorder      problems sleeping   ??? Neurological Disorder      hx migraines   ??? Infectious Disease      MRSA- blood cultures   ??? Other Ill-Defined Conditions      port right subclavian, hx kidney stones          Past Surgical History   Procedure Date   ??? Cystoscopy    ??? Lithotripsy    ??? Hx vascular access      port to right chest s/p 5 years.   ??? Hx appendectomy    ??? Hx gyn      cysts from ovaries, c-section, tubal ligation   ??? Hx tubal ligation    ??? Hx tubal ligation    ??? Hx urological      for stones   ??? Hx heent    ??? Hx tonsillectomy    ??? Hx other surgical            No family history on file.     History   Social History   ??? Marital Status: Divorced     Spouse Name: N/A     Number of Children: N/A   ??? Years of Education: N/A   Occupational History   ??? Not on file.   Social History Main Topics   ??? Tobacco Use: Never    ??? Alcohol Use: No   ??? Drug Use: No   ??? Sexually Active: Yes -- Female partner(s)     Birth Control/ Protection: Surgical   Other Topics Concern   ??? Not on file   Social History Narrative   ??? No narrative on file           ALLERGIES: Codeine, Pcn, Prednisone, Ketorolac tromethamine, Shellfish, Iv dye, iodine containing, Influenza virus vaccine and Pneumovax 23      Review of Systems   Constitutional: Negative for fever and diaphoresis.   HENT: Negative for congestion and neck pain.    Eyes: Negative for redness.   Respiratory: Negative for cough and shortness of breath.    Cardiovascular: Negative for chest pain.   Gastrointestinal: Positive for nausea and vomiting.   Skin: Negative for rash.  Neurological: Positive for headaches. Negative for focal weakness.   All other systems reviewed and are negative.      Filed Vitals:    06/12/2007  9:32 AM   BP: 127/83   Pulse: 87   Temp: 98.1 ??F (36.7 ??C)   Resp: 18   Height: 5\' 4"  (1.626 m)   Weight: 140 lb (63.504 kg)   SpO2: 98%              Physical Exam   Nursing note and vitals reviewed.  Constitutional: She is oriented. She appears well-developed and well-nourished.   HENT:   Head: Normocephalic.   Eyes: Conjunctivae and extraocular motions are normal. Pupils are equal, round, and reactive to light.   Neck: Normal range of motion. No thyromegaly present.   Cardiovascular: Normal rate.    Pulmonary/Chest: Effort normal. No respiratory distress.   Abdominal: Soft. No tenderness.   Musculoskeletal: Normal range of motion. She exhibits no edema.   Lymphadenopathy:     She has no cervical adenopathy.   Neurological: She is alert and oriented.   Skin: Skin is warm and dry.   Psychiatric: Her behavior is normal.        Coding

## 2007-06-12 NOTE — ED Notes (Signed)
Migraine for the past 2 days, no relief from imitrex and midrin. Also complains of nausea and vomiting.

## 2007-06-13 MED ORDER — SALINE PERIPHERAL FLUSH PRN
INTRAMUSCULAR | Status: DC | PRN
Start: 2007-06-13 — End: 2007-06-13

## 2007-06-13 MED ORDER — PROCHLORPERAZINE EDISYLATE 5 MG/ML INJECTION
5 mg/mL | INTRAMUSCULAR | Status: DC
Start: 2007-06-13 — End: 2007-06-13

## 2007-06-13 MED ORDER — PROCHLORPERAZINE EDISYLATE 5 MG/ML INJECTION
5 mg/mL | INTRAMUSCULAR | Status: AC
Start: 2007-06-13 — End: 2007-06-13
  Administered 2007-06-13: 09:00:00 via INTRAMUSCULAR

## 2007-06-13 MED ORDER — SALINE PERIPHERAL FLUSH Q8H
Freq: Three times a day (TID) | INTRAMUSCULAR | Status: DC
Start: 2007-06-13 — End: 2007-06-13

## 2007-06-13 MED FILL — PROCHLORPERAZINE EDISYLATE 5 MG/ML INJECTION: 5 mg/mL | INTRAMUSCULAR | Qty: 2

## 2007-06-13 NOTE — ED Notes (Signed)
Feels much better

## 2007-06-13 NOTE — ED Notes (Signed)
Pt to CT via stretcher.

## 2007-06-13 NOTE — ED Provider Notes (Addendum)
HPI Comments: Here with headaches for which was seen by Dr Constance Goltz + meds called in by her PMD. Fairly global HA and feels she is less steady on her feet.    Headache   [name] This is a new problem. Episode Onset: Thursday. Progression Since Onset: imprved after Er meds and then returned. The headache is associated with nothing. The pain is severe. Associated symptoms include visual change and nausea. Pertinent negatives include no fever, no chest pressure, no near-syncope, no shortness of breath and no tingling. She has tried triptan therapy (Midrin) for the symptoms.        Past Medical History   Diagnosis Date   ??? Pulmonary Embolism    ??? Factor V (Leiden) Deficiency    ??? Kidney Calculi    ??? Thromboembolus      pulmonary emboli x 15   ??? Cholecystitis with Cholelithiasis    ??? Chronic Kidney Disease      Renal stones   ??? Autoimmune Disease      factor 5 defficiency   ??? Sleep Disorder      problems sleeping   ??? Neurological Disorder      hx migraines   ??? Infectious Disease      MRSA- blood cultures   ??? Other Ill-Defined Conditions      port right subclavian, hx kidney stones          Past Surgical History   Procedure Date   ??? Cystoscopy    ??? Lithotripsy    ??? Hx vascular access      port to right chest s/p 5 years.   ??? Hx appendectomy    ??? Hx gyn      cysts from ovaries, c-section, tubal ligation   ??? Hx tubal ligation    ??? Hx tubal ligation    ??? Hx urological      for stones   ??? Hx heent    ??? Hx tonsillectomy    ??? Hx other surgical            No family history on file.     History   Social History   ??? Marital Status: Divorced     Spouse Name: N/A     Number of Children: N/A   ??? Years of Education: N/A   Occupational History   ??? Not on file.   Social History Main Topics   ??? Tobacco Use: Never   ??? Alcohol Use: No   ??? Drug Use: No   ??? Sexually Active: Yes -- Female partner(s)     Birth Control/ Protection: Surgical   Other Topics Concern   ??? Not on file   Social History Narrative    ??? No narrative on file           ALLERGIES: Codeine, Pcn, Prednisone, Ketorolac tromethamine, Shellfish, Iv dye, iodine containing, Influenza virus vaccine and Pneumovax 23      Review of Systems   Constitutional: Negative for fever.   HENT: Negative for neck pain.    Respiratory: Negative.  Negative for shortness of breath.    Cardiovascular: Negative.    Gastrointestinal: Positive for nausea.   Musculoskeletal: Negative for back pain and joint pain.   Neurological: Positive for headaches. Negative for tingling, sensory change, speech change and focal weakness.   Psychiatric/Behavioral: Negative.        Filed Vitals:    06/13/2007  2:32 AM   BP: 103/52  Pulse: 79   Temp: 98.5 ??F (36.9 ??C)   Resp: 16   Height: 5\' 4"  (1.626 m)   Weight: 140 lb (63.504 kg)   SpO2: 97%              Physical Exam   Constitutional: She is oriented. She appears well-developed and well-nourished. No distress.   HENT:   Head: Normocephalic.   Right Ear: External ear normal.   Left Ear: External ear normal.   Nose: Nose normal.   Mouth/Throat: Oropharynx is clear and moist.   Eyes: Conjunctivae and extraocular motions are normal. Pupils are equal, round, and reactive to light. No scleral icterus.   Neck: Normal range of motion and full passive range of motion without pain. Neck supple. No spinous process tenderness and no muscular tenderness present. No Brudzinski's sign and no Kernig's sign noted.   Cardiovascular: Normal rate, regular rhythm and normal heart sounds.    Pulmonary/Chest: Effort normal. No respiratory distress. She has no wheezes.   Neurological: She is alert and oriented. She has normal strength. No cranial nerve deficit or sensory deficit.        Normal past pointing and heel shin  No drift   Skin: Skin is warm. No rash noted. No erythema.   Psychiatric: She has a normal mood and affect. Her behavior is normal. Judgment and thought content normal.        MDM Coding   Interpretation: CT scan

## 2007-06-13 NOTE — ED Notes (Signed)
Med given per MD order.

## 2007-06-13 NOTE — ED Notes (Signed)
Pt in room c/o migraine HA that started on Thursday.  Pt states prescribed Imitrex and midrin by MD without relief , then seen here earlier and given an injection for pain and nausea that helped for approx 3 hours per pt.  Pt states she has been "stumbling" when she tries to walk.

## 2007-06-13 NOTE — ED Notes (Signed)
Pt d/c'd, ambulatory.  Pain 3/10.  Instructions given / patient verbalized understanding and e-signed out.

## 2007-06-13 NOTE — ED Notes (Signed)
Lab called to come and draw blood work, Pt. Refused to allow this nurse to draw blood or attempt IV, Pt. Requesting central line

## 2007-06-13 NOTE — ED Notes (Signed)
Pt lying in bed , lights out, eyes closed / do distress noted.

## 2007-06-14 LAB — DRUG SCREEN, URINE
ACETAMINOPHEN: POSITIVE
AMPHETAMINES: NEGATIVE
BARBITURATES: POSITIVE
BENZODIAZEPINES: POSITIVE
COCAINE: NEGATIVE
METHADONE: NEGATIVE
Methamphetamines: NEGATIVE
OPIATES: NEGATIVE
PCP(PHENCYCLIDINE): NEGATIVE
THC (TH-CANNABINOL): NEGATIVE
TRICYCLICS: POSITIVE

## 2007-06-14 MED ORDER — ONDANSETRON (PF) 4 MG/2 ML INJECTION
4 mg/2 mL | INTRAMUSCULAR | Status: DC
Start: 2007-06-14 — End: 2007-06-13

## 2007-06-14 MED ORDER — SUMATRIPTAN 6 MG/0.5 ML SUB-Q
6 mg/0.5 mL | SUBCUTANEOUS | Status: AC
Start: 2007-06-14 — End: 2007-06-13
  Administered 2007-06-14: 01:00:00 via SUBCUTANEOUS

## 2007-06-14 MED ORDER — SODIUM CHLORIDE 0.9% BOLUS IV
0.9 % | INTRAVENOUS | Status: DC
Start: 2007-06-14 — End: 2007-06-13

## 2007-06-14 MED FILL — ONDANSETRON (PF) 4 MG/2 ML INJECTION: 4 mg/2 mL | INTRAMUSCULAR | Qty: 2

## 2007-06-14 MED FILL — IMITREX 6 MG/0.5 ML SUBCUTANEOUS SOLUTION: 6 mg/0.5 mL | SUBCUTANEOUS | Qty: 0.5

## 2007-06-18 NOTE — ED Provider Notes (Signed)
HPI Comments: Pt. Here earlier today for same complaint of headache. Pt. Was medicated in ER and states she felt better then returned home again and pain returned. She told Dr. Verlon Au when she was seen by him that she was unsteady on her feet and a head CT scan was done in the ER which was negative. Pt. Has Imitrex at home which she has not tried yet for the headache.    Headache   [name] This is a recurrent problem. The current episode started 6 to 12 hours ago. The problem occurs constantly. The problem has not changed since onset. The headache is associated with nothing. The pain is located in the generalized region. Associated symptoms include nausea and vomiting. Pertinent negatives include no anorexia, no fever, no palpitations, no shortness of breath, no dizziness and no visual change.        Past Medical History   Diagnosis Date   ??? Pulmonary Embolism    ??? Factor V (Leiden) Deficiency    ??? Kidney Calculi    ??? Thromboembolus      pulmonary emboli x 15   ??? Cholecystitis with Cholelithiasis    ??? Chronic Kidney Disease      Renal stones   ??? Autoimmune Disease      factor 5 defficiency   ??? Sleep Disorder      problems sleeping   ??? Neurological Disorder      hx migraines   ??? Infectious Disease      MRSA- blood cultures   ??? Other Ill-Defined Conditions      port right subclavian, hx kidney stones          Past Surgical History   Procedure Date   ??? Cystoscopy    ??? Lithotripsy    ??? Hx vascular access      port to right chest s/p 5 years.   ??? Hx appendectomy    ??? Hx gyn      cysts from ovaries, c-section, tubal ligation   ??? Hx tubal ligation    ??? Hx tubal ligation    ??? Hx urological      for stones   ??? Hx heent    ??? Hx tonsillectomy    ??? Hx other surgical            No family history on file.     History   Social History   ??? Marital Status: Divorced     Spouse Name: N/A     Number of Children: N/A   ??? Years of Education: N/A   Occupational History   ??? Not on file.    Social History Main Topics   ??? Tobacco Use: Never   ??? Alcohol Use: No   ??? Drug Use: No   ??? Sexually Active: Yes -- Female partner(s)     Birth Control/ Protection: Surgical   Other Topics Concern   ??? Not on file   Social History Narrative   ??? No narrative on file           ALLERGIES: Codeine, Pcn, Prednisone, Ketorolac tromethamine, Shellfish, Iv dye, iodine containing, Influenza virus vaccine and Pneumovax 23      Review of Systems   Unable to perform ROS: other   Constitutional: Negative for fever.   Respiratory: Negative for shortness of breath.    Cardiovascular: Negative for palpitations.   Gastrointestinal: Positive for nausea and vomiting.   Neurological: Positive for headaches. Negative for  dizziness.       Filed Vitals:    06/13/2007  8:07 PM 06/13/2007  8:22 PM 06/13/2007  8:37 PM 06/13/2007  9:10 PM   BP: 122/60 120/70 94/55    Pulse:       Temp:       Resp:       Height:       Weight:       SpO2: 97% 97% 98% 98%              Physical Exam   Nursing note and vitals reviewed.  Constitutional: She is oriented. Vital signs are normal. She appears well-developed and well-nourished. She is active.  Non-toxic appearance. She does not appear ill. She appears distressed.   HENT:   Head: Normocephalic and atraumatic.   Right Ear: Tympanic membrane normal.   Left Ear: Tympanic membrane normal.   Nose: Nose normal.   Mouth/Throat: Uvula is midline, oropharynx is clear and moist and mucous membranes are normal.   Eyes: Conjunctivae and extraocular motions are normal. Pupils are equal, round, and reactive to light.   Neck: Normal range of motion. Neck supple.   Cardiovascular: Normal rate, regular rhythm, normal heart sounds and intact distal pulses.  Exam reveals no gallop and no friction rub.    No murmur heard.  Pulmonary/Chest: Effort normal and breath sounds normal. No respiratory distress. She has no wheezes.   Abdominal: Soft. Bowel sounds are normal. She exhibits no distension. No tenderness.    Musculoskeletal: Normal range of motion. She exhibits no edema and no tenderness.   Lymphadenopathy:     She has no cervical adenopathy.   Neurological: She is alert and oriented.        Pt. Uncooperative with neurological exam   Skin: Skin is warm, dry and intact.   Psychiatric: Her affect is blunt. Her speech is slurred. She is agitated and slowed.        MDM Coding   Reviewed: nursing note and vitals                Pt. Eloped from ER before tests could be performed or medication could be given.

## 2007-06-29 LAB — CBC WITH AUTOMATED DIFF
ABS. BASOPHILS: 0 10*3/uL (ref 0.0–0.2)
ABS. EOSINOPHILS: 0.2 10*3/uL (ref 0.00–0.80)
ABS. IMM. GRANS.: 0 10*3/uL (ref 0.0–2.0)
ABS. LYMPHOCYTES: 1.4 10*3/uL (ref 0.6–4.3)
ABS. MONOCYTES: 0.4 10*3/uL (ref 0.1–0.9)
ABS. NEUTROPHILS: 3.1 10*3/uL (ref 1.9–7.8)
BASOPHILS: 0 % — ABNORMAL LOW (ref 0.1–1.6)
EOSINOPHILS: 4 % (ref 0.5–7.8)
HCT: 43 % (ref 35.6–45.0)
HGB: 14.5 g/dL (ref 11.7–15.0)
LYMPHOCYTES: 27 % (ref 14.7–41.3)
MCH: 31.5 PG (ref 26.1–32.9)
MCHC: 33.7 g/dL (ref 31.4–35.0)
MCV: 93.3 FL (ref 79.6–97.8)
MONOCYTES: 9 % (ref 3.2–9.0)
MPV: 10.5 FL (ref 9.3–12.9)
NEUTROPHILS: 60 % (ref 47.0–74.6)
PLATELET: 170 10*3/uL (ref 140–440)
RBC: 4.61 M/uL (ref 3.86–5.18)
RDW: 13.1 % (ref 11.9–14.6)
WBC: 5.1 10*3/uL (ref 4.5–10.5)

## 2007-06-29 LAB — METABOLIC PANEL, COMPREHENSIVE
A-G Ratio: 1.3 (ref 1.2–3.5)
ALT (SGPT): 25 U/L — ABNORMAL LOW (ref 30–65)
AST (SGOT): 11 U/L — ABNORMAL LOW (ref 15–37)
Albumin: 4.3 g/dL (ref 3.5–5.0)
Alk. phosphatase: 122 U/L (ref 50–136)
Anion gap: 7 mmol/L (ref 7–16)
BUN: 17 MG/DL (ref 7–18)
Bilirubin, total: 0.2 MG/DL (ref 0.2–1.1)
CO2: 27 MMOL/L (ref 21–32)
Calcium: 9.9 MG/DL (ref 8.4–10.4)
Chloride: 104 MMOL/L (ref 98–107)
Creatinine: 0.8 MG/DL (ref 0.6–1.0)
GFR est AA: >60 mL/min/{1.73_m2} (ref >60)
GFR est non-AA: >60 mL/min/{1.73_m2} (ref >60)
Globulin: 3.4 g/dL (ref 2.3–3.5)
Glucose: 98 MG/DL (ref 74–106)
Potassium: 4.3 MMOL/L (ref 3.5–5.1)
Protein, total: 7.7 g/dL (ref 6.3–8.2)
Sodium: 138 MMOL/L (ref 136–145)

## 2007-06-29 LAB — LIPASE: Lipase: 282 U/L (ref 114–286)

## 2007-06-29 MED ORDER — SODIUM CHLORIDE 0.9% BOLUS IV
0.9 % | INTRAVENOUS | Status: DC
Start: 2007-06-29 — End: 2007-06-29

## 2007-06-29 MED ORDER — ONDANSETRON (PF) 4 MG/2 ML INJECTION
4 mg/2 mL | INTRAMUSCULAR | Status: AC
Start: 2007-06-29 — End: 2007-06-29
  Administered 2007-06-29: 22:00:00 via INTRAMUSCULAR

## 2007-06-29 MED ORDER — HYDROMORPHONE 2 MG/ML INJECTION SOLUTION
2 mg/mL | INTRAMUSCULAR | Status: AC
Start: 2007-06-29 — End: 2007-06-29
  Administered 2007-06-29: 22:00:00 via INTRAMUSCULAR

## 2007-06-29 MED ORDER — ONDANSETRON (PF) 4 MG/2 ML INJECTION
4 mg/2 mL | INTRAMUSCULAR | Status: DC
Start: 2007-06-29 — End: 2007-06-29

## 2007-06-29 MED ORDER — HYDROMORPHONE 2 MG/ML INJECTION SOLUTION
2 mg/mL | INTRAMUSCULAR | Status: DC
Start: 2007-06-29 — End: 2007-06-29

## 2007-06-29 MED FILL — ONDANSETRON (PF) 4 MG/2 ML INJECTION: 4 mg/2 mL | INTRAMUSCULAR | Qty: 2

## 2007-06-29 MED FILL — HYDROMORPHONE 2 MG/ML INJECTION SOLUTION: 2 mg/mL | INTRAMUSCULAR | Qty: 1

## 2007-06-29 NOTE — ED Notes (Signed)
Bedside report given to Will, RN. Phlebotomy to recollect CBC.

## 2007-06-29 NOTE — ED Provider Notes (Signed)
HPI Comments: Krista Lopez is [name] in NAD presents with c/o RUQ pain that radiates to her back and assc with N&V onset several weeks ago but getting worse today.      Abdominal Pain   [name] This is a new problem. The current episode started more than 1 week ago. The problem occurs constantly. The problem has been gradually worsening. The pain is associated with vomiting and eating. The pain is at a severity of 10/10. Associated symptoms include nausea, vomiting and back pain. Pertinent negatives include no fever, no dysuria, no frequency and no trauma. The pain is worsened by eating and palpation. The pain is relieved by nothing. The patient's surgical history includes appendectomy.       Past Medical History   Diagnosis Date   ??? Pulmonary Embolism    ??? Factor V (Leiden) Deficiency    ??? Kidney Calculi    ??? Thromboembolus      pulmonary emboli x 15   ??? Cholecystitis with Cholelithiasis    ??? Chronic Kidney Disease      Renal stones   ??? Autoimmune Disease      factor 5 defficiency   ??? Sleep Disorder      problems sleeping   ??? Neurological Disorder      hx migraines   ??? Infectious Disease      MRSA- blood cultures   ??? Other Ill-Defined Conditions      port right subclavian, hx kidney stones          Past Surgical History   Procedure Date   ??? Cystoscopy    ??? Lithotripsy    ??? Hx vascular access      port to right chest s/p 5 years.   ??? Hx appendectomy    ??? Hx gyn      cysts from ovaries, c-section, tubal ligation   ??? Hx tubal ligation    ??? Hx tubal ligation    ??? Hx urological      for stones   ??? Hx heent    ??? Hx tonsillectomy    ??? Hx other surgical            No family history on file.     History   Social History   ??? Marital Status: Divorced     Spouse Name: N/A     Number of Children: N/A   ??? Years of Education: N/A   Occupational History   ??? Not on file.   Social History Main Topics   ??? Tobacco Use: Never   ??? Alcohol Use: No   ??? Drug Use: No    ??? Sexually Active: Yes -- Female partner(s)     Birth Control/ Protection: Surgical   Other Topics Concern   ??? Not on file   Social History Narrative   ??? No narrative on file           ALLERGIES: Codeine, Pcn, Prednisone, Ketorolac tromethamine, Shellfish, Iv dye, iodine containing, Influenza virus vaccine and Pneumovax 23      Review of Systems   Constitutional: Negative for fever.   Gastrointestinal: Positive for nausea, vomiting and abdominal pain.   Genitourinary: Negative for dysuria and frequency.   Musculoskeletal: Positive for back pain.   All other systems reviewed and are negative.      Filed Vitals:    06/29/2007  4:13 PM   BP: 132/91   Pulse: 90  Temp: 98.7 ??F (37.1 ??C)   Resp: 16   Height: 5\' 4"  (1.626 m)   Weight: 139 lb (63.05 kg)   SpO2: 97%              Physical Exam   Nursing note and vitals reviewed.  Constitutional: She is oriented. She appears well-developed and well-nourished. She is cooperative. She appears ill. No distress.   HENT:   Head: Normocephalic and atraumatic.   Right Ear: External ear normal.   Left Ear: External ear normal.   Nose: Nose normal.   Mouth/Throat: Oropharynx is clear and moist.   Eyes: Conjunctivae and extraocular motions are normal. No scleral icterus.   Neck: Normal range of motion. Neck supple.   Cardiovascular: Normal rate, regular rhythm and intact distal pulses.    Pulmonary/Chest: Effort normal and breath sounds normal. No respiratory distress.   Abdominal: Soft. Normal appearance. There is no organomegaly, splenomegaly or hepatomegaly. Tenderness is present. She has guarding. She has no rebound and no CVA tenderness.   Musculoskeletal: Normal range of motion. She exhibits no tenderness.   Neurological: She is alert and oriented.   Skin: Skin is warm and dry. No rash noted.   Psychiatric: She has a normal mood and affect. Her behavior is normal.        MDM Coding   Interpretation: labs and ultrasound                Recent Results (from the past 24 hour(s))    CBC W/ AUTOMATED DIFF    Collection Time 06/29/07  5:10 PM   Component Value Range   ??? WBC 5.1  4.5-10.5 (K/uL)   ??? RBC 4.61  3.86-5.18 (M/uL)   ??? HGB 14.5  11.7-15.0 (Harrell Niehoff/dL)   ??? HCT 43.0  35.6-45.0 (%)   ??? MCV 93.3  79.6-97.8 (FL)   ??? MCH 31.5  26.1-32.9 (PG)   ??? MCHC 33.7  31.4-35.0 (Jametta Moorehead/dL)   ??? RDW 13.1  11.9-14.6 (%)   ??? PLATELET 170  140-440 (K/uL)   ??? MPV 10.5  9.3-12.9 (FL)   ??? DF AUTOMATED  -    ??? NEUTROPHILS 60  47.0-74.6 (%)   ??? LYMPHOCYTES 27  14.7-41.3 (%)   ??? MONOCYTES 9  3.2-9.0 (%)   ??? EOSINOPHILS 4  0.5-7.8 (%)   ??? BASOPHILS 0 (*) 0.1-1.6 (%)   ??? ABSOLUTE NEUTS 3.1  1.9-7.8 (K/UL)   ??? ABSOLUTE LYMPHS 1.4  0.6-4.3 (K/UL)   ??? ABSOLUTE MONOS 0.4  0.1-0.9 (K/UL)   ??? ABSOLUTE EOS 0.2  0.00-0.80 (K/UL)   ??? ABSOLUTE BASOS 0.0  0.0-0.2 (K/UL)   ??? ABS. IMM. GRANS. 0.0  0.0-2.0 (K/UL)   METABOLIC PANEL, COMPREHENSIVE    Collection Time 06/29/07  5:30 PM   Component Value Range   ??? SODIUM 138  136-145 (MMOL/L)   ??? POTASSIUM 4.3  3.5-5.1 (MMOL/L)   ??? CHLORIDE 104  98-107 (MMOL/L)   ??? CO2 27  21-32 (MMOL/L)   ??? ANION GAP 7  7-16 (mmol/L)   ??? GLUCOSE 98  74-106 (MG/DL)   ??? BUN 17  9-14 (MG/DL)   ??? CREATININE 0.8  0.6-1.0 (MG/DL)   ??? GFR est AA >60  - (ml/min/1.73m2)   ??? GFR est non-AA >60  - (ml/min/1.67m2)   ??? CALCIUM 9.9  8.4-10.4 (MG/DL)   ??? BILIRUBIN,TOTAL 0.2  0.2-1.1 (MG/DL)   ??? ALT 25 (*) 30-65 (U/L)   ??? AST 11 (*) 15-37 (U/L)   ??? ALK  PHOS 122  50-136 (U/L)   ??? PROTEIN,TOTAL 7.7  6.3-8.2 (Vinetta Brach/dL)   ??? ALBUMIN 4.3  3.5-5.0 (Kendryck Lacroix/dL)   ??? GLOBULIN 3.4  2.3-3.5 (Valeree Leidy/dL)   ??? A-Jaquisha Frech RATIO 1.3  1.2-3.5 ( )   LIPASE    Collection Time 06/29/07  5:30 PM   Component Value Range   ??? LIPASE 282  114-286 (U/L)

## 2007-06-29 NOTE — ED Notes (Signed)
Phlebotomy called to draw labs after three unsuccessful attempts.

## 2007-06-29 NOTE — ED Notes (Signed)
Bedside report given by Antionette Fairy RN.

## 2007-06-29 NOTE — ED Notes (Signed)
Phlebotomy finally able to collect blood after several attempts. Pt taken to ultrasound via stretcher.

## 2007-07-01 NOTE — ED Notes (Signed)
I have reviewed discharge instructions with the patient.  The patient verbalized understanding.

## 2007-07-01 NOTE — ED Provider Notes (Signed)
Abdominal Pain   Pertinent negatives include no nausea and no vomiting.    Frequent ED visits for chronic pain.  Was seen here 2 days ago for RUQ pain.  Said Ultram isn't working and wants something stronger.  No new sx.    Past Medical History   Diagnosis Date   ??? Pulmonary Embolism    ??? Factor V (Leiden) Deficiency    ??? Kidney Calculi    ??? Thromboembolus      pulmonary emboli x 15   ??? Cholecystitis with Cholelithiasis    ??? Chronic Kidney Disease      Renal stones   ??? Autoimmune Disease      factor 5 defficiency   ??? Sleep Disorder      problems sleeping   ??? Neurological Disorder      hx migraines   ??? Infectious Disease      MRSA- blood cultures   ??? Other Ill-Defined Conditions      port right subclavian, hx kidney stones          Past Surgical History   Procedure Date   ??? Cystoscopy    ??? Lithotripsy    ??? Hx vascular access      port to right chest s/p 5 years.   ??? Hx appendectomy    ??? Hx gyn      cysts from ovaries, c-section, tubal ligation   ??? Hx tubal ligation    ??? Hx tubal ligation    ??? Hx urological      for stones   ??? Hx heent    ??? Hx tonsillectomy    ??? Hx other surgical            No family history on file.     History   Social History   ??? Marital Status: Divorced     Spouse Name: N/A     Number of Children: N/A   ??? Years of Education: N/A   Occupational History   ??? Not on file.   Social History Main Topics   ??? Tobacco Use: Never   ??? Alcohol Use: No   ??? Drug Use: No   ??? Sexually Active: Yes -- Female partner(s)     Birth Control/ Protection: Surgical   Other Topics Concern   ??? Not on file   Social History Narrative   ??? No narrative on file           ALLERGIES: Codeine, Pcn, Prednisone, Ketorolac tromethamine, Shellfish, Iv dye, iodine containing, Influenza virus vaccine and Pneumovax 23      Review of Systems   Gastrointestinal: Positive for abdominal pain. Negative for nausea and vomiting.   All other systems reviewed and are negative.      Filed Vitals:    07/01/2007  4:26 PM   BP: 129/67   Pulse: 87    Temp: 98.6 ??F (37 ??C)   Resp: 20   Height: 5\' 4"  (1.626 m)   Weight: 139 lb (63.05 kg)   SpO2: 95%              Physical Exam   Nursing note and vitals reviewed.  Constitutional: She is oriented. She appears well-developed and well-nourished. No distress.   HENT:   Head: Normocephalic and atraumatic.   Right Ear: External ear normal.   Left Ear: External ear normal.   Eyes: Conjunctivae are normal. Pupils are equal, round, and reactive to light.   Neck: Normal range of motion. Neck supple.  Cardiovascular: Normal rate, regular rhythm and normal heart sounds.  Exam reveals no gallop and no friction rub.    No murmur heard.  Pulmonary/Chest: Effort normal and breath sounds normal. No respiratory distress. She has no wheezes. She has no rales.   Abdominal: Soft. Bowel sounds are normal. She exhibits no distension and no mass. No tenderness. She has no rebound and no guarding.   Musculoskeletal: Normal range of motion.   Neurological: She is alert and oriented.   Skin: Skin is warm and dry.   Psychiatric: She has a normal mood and affect. Her behavior is normal. Judgment and thought content normal.        Coding

## 2007-07-01 NOTE — ED Notes (Signed)
Pt was seen here this week for cholecystitis. Sent home with ultram and instructions to follow up with GI. Appointment with GI is not until end of June. Pain is not relieved with current medications, GI cannot see her for pain until next week. Pt requesting something stronger for pain and for nausea

## 2007-07-03 NOTE — ED Notes (Signed)
I have reviewed the chart and agree with the midlevel provider's course of treatment.  Eneida Evers Louis Eunie Lawn, MD

## 2007-07-13 LAB — URINE MICROSCOPIC
Casts: 0 /LPF
Crystals, urine: 0 /LPF
Mucus: 0 /LPF
RBC: 0 /HPF

## 2007-07-13 LAB — HCG URINE, QL. - POC: Pregnancy test,urine (POC): NEGATIVE

## 2007-07-13 MED ORDER — HYDROCODONE-ACETAMINOPHEN 10 MG-500 MG TAB
10-500 mg | ORAL | Status: AC
Start: 2007-07-13 — End: 2007-07-13
  Administered 2007-07-13: 17:00:00 via ORAL

## 2007-07-13 MED FILL — HYDROCODONE-ACETAMINOPHEN 10 MG-500 MG TAB: 10-500 mg | ORAL | Qty: 1

## 2007-07-13 NOTE — ED Notes (Signed)
Pt states she is supposed to see a NP on Friday with a GI group.  They moved her appointment up from June 25th. 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

## 2007-07-13 NOTE — ED Notes (Signed)
MULTIPLE ED VISITS!

## 2007-07-13 NOTE — ED Provider Notes (Signed)
HPI Comments: Patient returns to ED for pain control of abd. She has been seen numerous times for pain complaints. She was seen dt [facility] and told she had dysfunctional gall bladder. She is seeking pain control until evaluated on July 16, 2007 at GI. Her primary care provider will not fill pain medication.     Abdominal Pain   [name] This is a chronic problem. The current episode started more than 1 week ago. The problem occurs constantly. The problem has not changed since onset. The pain is associated with vomiting and eating. The pain is severe. Pertinent negatives include no diarrhea, no nausea, no vomiting, no constipation, no dysuria, no frequency and no back pain. The pain is worsened by eating. The pain is relieved by nothing. Past workup includes CT scan and ultrasound. Her past medical history is significant for kidney stones.        Past Medical History   Diagnosis Date   ??? Pulmonary Embolism    ??? Factor V (Leiden) Deficiency    ??? Kidney Calculi    ??? Thromboembolus      pulmonary emboli x 15   ??? Cholecystitis with Cholelithiasis    ??? Chronic Kidney Disease      Renal stones   ??? Autoimmune Disease      factor 5 defficiency   ??? Sleep Disorder      problems sleeping   ??? Neurological Disorder      hx migraines   ??? Infectious Disease      MRSA- blood cultures   ??? Other Ill-Defined Conditions      port right subclavian, hx kidney stones          Past Surgical History   Procedure Date   ??? Cystoscopy    ??? Lithotripsy    ??? Hx vascular access      port to right chest s/p 5 years.   ??? Hx appendectomy    ??? Hx gyn      cysts from ovaries, c-section, tubal ligation   ??? Hx tubal ligation    ??? Hx tubal ligation    ??? Hx urological      for stones   ??? Hx heent    ??? Hx tonsillectomy    ??? Hx other surgical            No family history on file.     History   Social History   ??? Marital Status: Divorced     Spouse Name: N/A     Number of Children: N/A   ??? Years of Education: N/A    Occupational History   ??? Not on file.   Social History Main Topics   ??? Tobacco Use: Never   ??? Alcohol Use: No   ??? Drug Use: No   ??? Sexually Active: Yes -- Female partner(s)     Birth Control/ Protection: Surgical   Other Topics Concern   ??? Not on file   Social History Narrative   ??? No narrative on file           ALLERGIES: Codeine, Pcn, Prednisone, Ketorolac tromethamine, Shellfish, Iv dye, iodine containing, Influenza virus vaccine and Pneumovax 23      Review of Systems   Gastrointestinal: Positive for abdominal pain. Negative for nausea, vomiting, diarrhea and constipation.   Genitourinary: Negative for dysuria and frequency.   Musculoskeletal: Negative for back pain.   All other systems reviewed and are negative.  Filed Vitals:    07/13/2007 12:23 PM   BP: 134/96   Pulse: 106   Temp: 98.4 ??F (36.9 ??C)   Resp: 20   Height: 5\' 4"  (1.626 m)   Weight: 139 lb (63.05 kg)   SpO2: 99%              Physical Exam   Nursing note and vitals reviewed.  Constitutional: She is oriented. She appears well-developed and well-nourished.   HENT:   Head: Normocephalic and atraumatic.   Right Ear: External ear normal.   Left Ear: External ear normal.   Nose: Nose normal.   Mouth/Throat: Oropharynx is clear and moist.   Eyes: Conjunctivae and extraocular motions are normal. Pupils are equal, round, and reactive to light.   Neck: Normal range of motion. Neck supple.   Cardiovascular: Normal rate, regular rhythm, normal heart sounds and intact distal pulses.    Pulmonary/Chest: Effort normal and breath sounds normal. No respiratory distress. She has no wheezes.   Abdominal: Soft. Bowel sounds are normal. Tenderness is present.   Musculoskeletal: Normal range of motion. She exhibits no edema and no tenderness.   Neurological: She is alert and oriented. No cranial nerve deficit. She exhibits normal muscle tone.   Skin: Skin is warm and dry.    Psychiatric: She has a normal mood and affect. Her behavior is normal. Judgment and thought content normal.        Coding

## 2007-07-13 NOTE — ED Notes (Signed)
I have reviewed discharge instructions with the patient and caregiver.  The patient verbalized understanding. To follow up with primary physician and return with worsening. DC home with family member.  Instructed not to drive.

## 2007-08-25 NOTE — ED Provider Notes (Signed)
HPI Comments: Presents w/ 2-day h/o left-sided pelvic pain c/w prior ovarian cyst episodes.   Is s/p BTL.  Not on BCPs d/t multiple PE history.   No fever, n/v/d, vag DC nor bleeding, dysuria.   Tried Tylenol w/ inadequate relief.  This is pts 28th ED visit in past year for various pain complaints of abd pain (negative U/S and HIDA), flank pain, and migraines.  One admission for atypical CP and two for MRSA & E.coli sepsis.   Last INR 2.8 two weeks ago @ PCP.  PCP = Maye Hides.  URO = R.Smith    Pelvic Pain  Primary symptoms include pelvic pain.  Primary symptoms include no dysuria and no vaginal bleeding. [name] Pertinent negatives include no abdominal pain, no constipation, no diarrhea, no nausea, no flank pain and no fever.        Past Medical History   Diagnosis Date   ??? Pulmonary Embolism    ??? Factor V (Leiden) Deficiency    ??? Kidney Calculi    ??? Thromboembolus      pulmonary emboli x 15   ??? Cholecystitis with Cholelithiasis    ??? Chronic Kidney Disease      Renal stones   ??? Autoimmune Disease      factor 5 defficiency   ??? Sleep Disorder      problems sleeping   ??? Neurological Disorder      hx migraines   ??? Infectious Disease      MRSA- blood cultures   ??? Other Ill-Defined Conditions      port right subclavian, hx kidney stones          Past Surgical History   Procedure Date   ??? Cystoscopy    ??? Lithotripsy    ??? Hx vascular access      port to right chest s/p 5 years.   ??? Hx appendectomy    ??? Hx gyn      cysts from ovaries, c-section, tubal ligation   ??? Hx tubal ligation    ??? Hx tubal ligation    ??? Hx urological      for stones   ??? Hx heent    ??? Hx tonsillectomy    ??? Hx other surgical            No family history on file.     History   Social History   ??? Marital Status: Divorced     Spouse Name: N/A     Number of Children: N/A   ??? Years of Education: N/A   Occupational History   ??? Not on file.   Social History Main Topics   ??? Tobacco Use: Never   ??? Alcohol Use: No   ??? Drug Use: No    ??? Sexually Active: Yes -- Female partner(s)     Birth Control/ Protection: Surgical   Other Topics Concern   ??? Not on file   Social History Narrative   ??? No narrative on file           ALLERGIES: Codeine, Pcn, Prednisone, Ketorolac tromethamine, Shellfish, Iv dye, iodine containing, Influenza virus vaccine and Pneumovax 23      Review of Systems   Constitutional: Positive for appetite change. Negative for fever, chills, activity change and fatigue.   Respiratory: Negative for cough and shortness of breath.    Cardiovascular: Negative for chest pain.   Gastrointestinal: Negative for nausea, abdominal pain, diarrhea and constipation.   Genitourinary:  Positive for pelvic pain. Negative for dysuria, urgency, hematuria, flank pain, vaginal bleeding, vaginal discharge and vaginal pain.   Musculoskeletal: Negative for myalgias.   All other systems reviewed and are negative.      Filed Vitals:    08/25/2007  6:24 PM 08/25/2007  8:22 PM   BP: 132/80 130/72   Pulse: 76 88   Temp: 98.7 ??F (37.1 ??C)    Resp: 18 16   Height: 5\' 4"  (1.626 m)    Weight: 139 lb (63.05 kg)    SpO2: 99%               Physical Exam   Vitals reviewed.  Constitutional: She appears well-developed. No distress.   HENT:   Head: Normocephalic.   Mouth/Throat: Oropharynx is clear and moist.   Eyes: No scleral icterus.   Neck: Normal range of motion. Neck supple.   Cardiovascular: Normal rate and normal heart sounds.    Pulmonary/Chest: Effort normal and breath sounds normal.   Abdominal: Soft. Bowel sounds are normal. She exhibits no distension and no mass. Tenderness (left pubic) is present. She has no guarding.   Neurological: She is alert.   Skin: Skin is warm and dry.    Room Air Oxygen saturation normal, no intervention necessary      MDM Coding   Reviewed: previous chart

## 2007-08-25 NOTE — ED Notes (Signed)
INT unsuccessful x 2, CC J. Woodberry notified & acknowledged.

## 2007-08-25 NOTE — ED Notes (Signed)
I have reviewed discharge instructions with the patient.  The patient verbalized understanding.

## 2007-08-26 MED ORDER — TRAMADOL 50 MG TAB
50 mg | ORAL | Status: AC
Start: 2007-08-26 — End: 2007-08-25
  Administered 2007-08-26: via ORAL

## 2007-08-26 MED ORDER — TRAMADOL 50 MG TAB
50 mg | ORAL_TABLET | Freq: Four times a day (QID) | ORAL | Status: AC | PRN
Start: 2007-08-26 — End: 2007-08-30

## 2007-08-26 MED FILL — TRAMADOL 50 MG TAB: 50 mg | ORAL | Qty: 2

## 2008-01-07 LAB — CBC WITH AUTOMATED DIFF
ABS. BASOPHILS: 0 10*3/uL (ref 0.0–0.2)
ABS. EOSINOPHILS: 0.1 10*3/uL (ref 0.0–0.7)
ABS. IMM. GRANS.: 0 10*3/uL (ref 0.0–2.0)
ABS. LYMPHOCYTES: 1.4 10*3/uL (ref 0.5–4.6)
ABS. MONOCYTES: 0.3 10*3/uL (ref 0.1–1.3)
ABS. NEUTROPHILS: 2.1 10*3/uL (ref 1.7–8.2)
BASOPHILS: 1 % (ref 0.0–2.0)
EOSINOPHILS: 4 % — ABNORMAL HIGH (ref 0.0–0.7)
HCT: 41.7 % (ref 37.6–48.3)
HGB: 14 g/dL (ref 11.7–15.0)
IMMATURE GRANULOCYTES: 0.2 % (ref 0.0–2.0)
LYMPHOCYTES: 36 % (ref 13–44)
MCH: 32 PG (ref 26.1–32.9)
MCHC: 33.6 g/dL (ref 31.4–35.0)
MCV: 95.4 FL (ref 79.6–97.8)
MONOCYTES: 9 % (ref 4.0–12.0)
MPV: 10.3 FL — ABNORMAL LOW (ref 10.8–14.1)
NEUTROPHILS: 50 % (ref 43–78)
PLATELET: 255 10*3/uL (ref 140–440)
RBC: 4.37 M/uL (ref 3.86–5.18)
RDW: 12.7 % (ref 11.9–14.6)
WBC: 4 10*3/uL (ref 4.0–10.5)

## 2008-01-07 LAB — METABOLIC PANEL, COMPREHENSIVE
A-G Ratio: 1.2 (ref 1.2–3.5)
ALT (SGPT): 26 U/L — ABNORMAL LOW (ref 39–65)
AST (SGOT): 13 U/L — ABNORMAL LOW (ref 15–37)
Albumin: 4.6 g/dL (ref 3.5–5.0)
Alk. phosphatase: 126 U/L (ref 50–136)
Anion gap: 14 mmol/L (ref 7–16)
BUN: 15 MG/DL (ref 7–18)
Bilirubin, total: 0.4 MG/DL (ref 0.2–1.1)
CO2: 24 MMOL/L (ref 21–32)
Calcium: 9.6 MG/DL (ref 8.4–10.4)
Chloride: 101 MMOL/L (ref 98–107)
Creatinine: 0.9 MG/DL (ref 0.6–1.0)
GFR est AA: >60 mL/min/{1.73_m2} (ref >60)
GFR est non-AA: >60 mL/min/{1.73_m2} (ref >60)
Globulin: 3.8 g/dL — ABNORMAL HIGH (ref 2.3–3.5)
Glucose: 91 MG/DL (ref 74–106)
Potassium: 3.8 MMOL/L (ref 3.5–5.1)
Protein, total: 8.4 g/dL — ABNORMAL HIGH (ref 6.3–8.2)
Sodium: 139 MMOL/L (ref 136–145)

## 2008-01-07 LAB — PROTHROMBIN TIME + INR
INR: 1 (ref 0.9–1.2)
Prothrombin time: 10.2 SECS (ref 9.4–10.8)

## 2008-01-07 LAB — LIPASE: Lipase: 176 U/L (ref 73–393)

## 2008-01-07 MED ORDER — WARFARIN 2.5 MG TAB
2.5 mg | ORAL_TABLET | Freq: Every day | ORAL | Status: DC
Start: 2008-01-07 — End: 2008-02-27

## 2008-01-07 MED ORDER — HYDROCODONE-ACETAMINOPHEN 7.5 MG-500 MG TAB
ORAL_TABLET | ORAL | Status: DC | PRN
Start: 2008-01-07 — End: 2008-04-03

## 2008-01-07 MED ADMIN — promethazine (PHENERGAN) injection 12.5 mg: INTRAVENOUS | @ 21:00:00 | NDC 60977000143

## 2008-01-07 MED ADMIN — HYDROmorphone (DILAUDID) injection 1 mg: INTRAVENOUS | @ 21:00:00 | NDC 00409131236

## 2008-01-07 MED ADMIN — nuclear medicine isotope: @ 21:00:00

## 2008-01-07 MED FILL — DIPHENHYDRAMINE HCL 50 MG/ML IJ SOLN: 50 mg/mL | INTRAMUSCULAR | Qty: 1

## 2008-01-07 MED FILL — DIPHENHYDRAMINE 50 MG CAP: 50 mg | ORAL | Qty: 1

## 2008-01-07 MED FILL — PROMETHAZINE 25 MG/ML INJECTION: 25 mg/mL | INTRAMUSCULAR | Qty: 1

## 2008-01-07 MED FILL — HYDROMORPHONE 2 MG/ML INJECTION SOLUTION: 2 mg/mL | INTRAMUSCULAR | Qty: 1

## 2008-01-07 NOTE — ED Notes (Signed)
Patient having reaction to medications that were given, patient itching with a rash. Patient given 25mg  benadryl iv per doctor order.Marland Kitchen

## 2008-01-07 NOTE — ED Notes (Signed)
Patient taken to  Ultrasound via stretcher.Marland Kitchen

## 2008-01-07 NOTE — ED Provider Notes (Signed)
HPI Comments: Krista Lopez is [name] who is here for right upper back pain especially when breathing in deep. +h/o PE x 12 and has been out of her coumadin for 15 days.  Dr. Polo Riley moved and the bus doesn't go there. 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  Denies shortness of breath but is in considerable pain with deep breathing. No fever. No cough. Non-smoker.          Patient is a 42 y.o. female presenting with back pain. [name]   Back Pain          Past Medical History   Diagnosis Date   ??? Pulmonary Embolism    ??? Factor V (Leiden) Deficiency    ??? Kidney Calculi    ??? Thromboembolus      pulmonary emboli x 15   ??? Cholecystitis with Cholelithiasis    ??? Chronic Kidney Disease      Renal stones   ??? Autoimmune Disease      factor 5 defficiency   ??? Sleep Disorder      problems sleeping   ??? Neurological Disorder      hx migraines   ??? Infectious Disease      MRSA- blood cultures   ??? Other Ill-Defined Conditions      port right subclavian, hx kidney stones          Past Surgical History   Procedure Date   ??? Cystoscopy    ??? Lithotripsy    ??? Hx vascular access      port to right chest s/p 5 years.   ??? Hx appendectomy    ??? Hx gyn      cysts from ovaries, c-section, tubal ligation   ??? Hx tubal ligation    ??? Hx tubal ligation    ??? Hx urological      for stones   ??? Hx heent    ??? Hx tonsillectomy    ??? Hx other surgical            No family history on file.     History   Social History   ??? Marital Status: Divorced     Spouse Name: N/A     Number of Children: N/A   ??? Years of Education: N/A   Occupational History   ??? Not on file.   Social History Main Topics   ??? Tobacco Use: Never   ??? Alcohol Use: No   ??? Drug Use: No   ??? Sexually Active: Yes -- Female partner(s)     Birth Control/ Protection: Surgical   Other Topics Concern   ??? Not on file   Social History Narrative   ??? No narrative on file            ALLERGIES: Codeine, Pcn, Prednisone, Ketorolac tromethamine, Shellfish, Iv dye, iodine containing, Influenza virus vaccine and Pneumovax 23      Review of Systems   Musculoskeletal: Positive for back pain.   All other systems reviewed and are negative.      Filed Vitals:    01/07/2008  1:58 PM   BP: 128/83   Pulse: 82   Temp: 97.5 ??F (36.4 ??C)   Resp: 16   Height: 5\' 4"  (1.626 m)   Weight: 135 lb (61.236 kg)   SpO2: 99%              Physical Exam   Nursing  note and vitals reviewed.  Constitutional: She is oriented. She appears well-developed and well-nourished. She appears not diaphoretic. No distress.        Tearful.   HENT:   Head: Normocephalic and atraumatic.   Right Ear: External ear normal.   Left Ear: External ear normal.   Eyes: Conjunctivae and extraocular motions are normal.   Neck: Normal range of motion. Neck supple.   Cardiovascular: Normal rate, regular rhythm and normal heart sounds.    Pulmonary/Chest: Effort normal and breath sounds normal. She exhibits no tenderness.        +++ pain with inspiration.   Musculoskeletal: Normal range of motion.   Neurological: She is alert and oriented.   Skin: Skin is warm and dry. She is not diaphoretic.   Psychiatric: She has a normal mood and affect. Her behavior is normal.            Coding      Procedures

## 2008-01-07 NOTE — ED Notes (Signed)
Patient states pain in upper left back when she takes a deep breath. Pt had pain in lower left leg two days ago.

## 2008-01-07 NOTE — ED Notes (Signed)
Long discussion with patient about follow up since she will be starting back on her coumadin. She promises to get in with Dr. Elsie Lincoln, one of the clinics or Dr. Francie Massing if she can find transportion.  She knows she needs her INR checked early next week. Emphasized the importance and she totally agrees, understands, and promises.  Will return here for anything urgent.

## 2008-01-07 NOTE — ED Notes (Signed)
Discharge instructions discussed with verbal understanding, pt's son to drive pt home, no questions or concerns at time of DC.

## 2008-01-07 NOTE — ED Notes (Signed)
Pt transported to nuclear medicine after IV meds given.

## 2008-01-12 NOTE — ED Notes (Signed)
I am signing this chart for administrative purposes.

## 2008-02-27 LAB — CBC WITH AUTOMATED DIFF
ABS. BASOPHILS: 0 10*3/uL (ref 0.0–0.2)
ABS. EOSINOPHILS: 0.4 10*3/uL (ref 0.0–0.8)
ABS. IMM. GRANS.: 0 10*3/uL (ref 0.0–2.0)
ABS. LYMPHOCYTES: 1.5 10*3/uL (ref 0.5–4.6)
ABS. MONOCYTES: 0.3 10*3/uL (ref 0.1–1.3)
ABS. NEUTROPHILS: 2.6 10*3/uL (ref 1.7–8.2)
BASOPHILS: 1 % (ref 0.0–2.0)
EOSINOPHILS: 9 % — ABNORMAL HIGH (ref 0.5–7.8)
HCT: 35.6 % — ABNORMAL LOW (ref 37.6–48.3)
HGB: 12.2 g/dL (ref 11.7–15.0)
IMMATURE GRANULOCYTES: 0 % (ref 0.0–2.0)
LYMPHOCYTES: 31 % (ref 13–44)
MCH: 31.9 PG (ref 26.1–32.9)
MCHC: 34.3 g/dL (ref 31.4–35.0)
MCV: 93 FL (ref 79.6–97.8)
MONOCYTES: 6 % (ref 4.0–12.0)
MPV: 10.2 FL — ABNORMAL LOW (ref 10.8–14.1)
NEUTROPHILS: 53 % (ref 43–78)
PLATELET: 212 10*3/uL (ref 140–440)
RBC: 3.83 M/uL — ABNORMAL LOW (ref 3.86–5.18)
RDW: 12.1 % (ref 11.9–14.6)
WBC: 4.9 10*3/uL (ref 4.0–10.5)

## 2008-02-27 LAB — METABOLIC PANEL, BASIC
Anion gap: 10 mmol/L (ref 7–16)
BUN: 14 MG/DL (ref 7–18)
CO2: 24 MMOL/L (ref 21–32)
Calcium: 9.7 MG/DL (ref 8.4–10.4)
Chloride: 104 MMOL/L (ref 98–107)
Creatinine: 1 MG/DL (ref 0.6–1.0)
GFR est AA: >60 mL/min/{1.73_m2} (ref >60)
GFR est non-AA: >60 mL/min/{1.73_m2} (ref >60)
Glucose: 110 MG/DL — ABNORMAL HIGH (ref 74–106)
Potassium: 4.3 MMOL/L (ref 3.5–5.1)
Sodium: 138 MMOL/L (ref 136–145)

## 2008-02-27 LAB — URINE MICROSCOPIC
Casts: 0 /LPF
Crystals, urine: 0 /LPF
Mucus: 0 /LPF

## 2008-02-27 LAB — HEPATIC FUNCTION PANEL
A-G Ratio: 1.2 (ref 1.2–3.5)
ALT (SGPT): 42 U/L (ref 39–65)
AST (SGOT): 19 U/L (ref 15–37)
Albumin: 4.6 g/dL (ref 3.5–5.0)
Alk. phosphatase: 173 U/L — ABNORMAL HIGH (ref 50–136)
Bilirubin, direct: 0.1 MG/DL (ref <0.4)
Bilirubin, total: 0.3 MG/DL (ref 0.2–1.1)
Globulin: 3.7 g/dL — ABNORMAL HIGH (ref 2.3–3.5)
Protein, total: 8.3 g/dL — ABNORMAL HIGH (ref 6.3–8.2)

## 2008-02-27 LAB — LIPASE: Lipase: 185 U/L (ref 73–393)

## 2008-02-27 MED ORDER — TRAMADOL 50 MG TAB
50 mg | ORAL_TABLET | Freq: Four times a day (QID) | ORAL | Status: AC | PRN
Start: 2008-02-27 — End: 2008-03-08

## 2008-02-27 MED ORDER — DICYCLOMINE 20 MG TAB
20 mg | ORAL_TABLET | Freq: Four times a day (QID) | ORAL | Status: AC
Start: 2008-02-27 — End: 2008-03-03

## 2008-02-27 MED ADMIN — hydrocodone-acetaminophen (LORTAB) 10-500 mg per tablet 1 Tab: ORAL | @ 21:00:00 | NDC 00406036362

## 2008-02-27 MED ADMIN — promethazine (PHENERGAN) injection 25 mg: INTRAVENOUS | @ 18:00:00 | NDC 00641092821

## 2008-02-27 MED ADMIN — sodium chloride 0.9 % bolus infusion 1,000 mL: INTRAVENOUS | @ 18:00:00 | NDC 00409798309

## 2008-02-27 MED ADMIN — lorazepam (ATIVAN) injection 1 mg: INTRAVENOUS | @ 18:00:00 | NDC 10019010239

## 2008-02-27 MED FILL — PROMETHAZINE 25 MG/ML INJECTION: 25 mg/mL | INTRAMUSCULAR | Qty: 1

## 2008-02-27 MED FILL — HYDROCODONE-ACETAMINOPHEN 10 MG-500 MG TAB: 10-500 mg | ORAL | Qty: 1

## 2008-02-27 MED FILL — LORAZEPAM 2 MG/ML IJ SOLN: 2 mg/mL | INTRAMUSCULAR | Qty: 1

## 2008-02-27 NOTE — ED Notes (Signed)
Lab called for blood draw for CBC

## 2008-02-27 NOTE — ED Provider Notes (Signed)
HPI Comments: Medicare disabled d/t "blood clots" ([place] 2007) & "Bipolar" presents w/ 1-day h/o RUQ abd pain w/ n/v.  No fever, diarrhea, flank pain, GU complaints.   Several ED visits for same.   RUQ ultrasound MAY09 showing incomplete distension, probable sludge.      Patient is a 43 y.o. female presenting with abdominal pain. [name]   Abdominal Pain   Associated symptoms include nausea and vomiting. Pertinent negatives include no fever, no diarrhea, no dysuria, no headaches, no myalgias and no chest pain.        Past Medical History   Diagnosis Date   ??? Pulmonary Embolism    ??? Factor V (Leiden) Deficiency    ??? Kidney Calculi    ??? Thromboembolus      pulmonary emboli x 15   ??? Cholecystitis with Cholelithiasis    ??? Chronic Kidney Disease      Renal stones   ??? Autoimmune Disease      factor 5 defficiency   ??? Sleep Disorder      problems sleeping   ??? Neurological Disorder      hx migraines   ??? Infectious Disease      MRSA- blood cultures   ??? Other Ill-Defined Conditions      port right subclavian, hx kidney stones          Past Surgical History   Procedure Date   ??? Cystoscopy    ??? Lithotripsy    ??? Hx vascular access      port to right chest s/p 5 years.   ??? Hx appendectomy    ??? Hx gyn      cysts from ovaries, c-section, tubal ligation   ??? Hx tubal ligation    ??? Hx tubal ligation    ??? Hx urological      for stones   ??? Hx heent    ??? Hx tonsillectomy    ??? Hx other surgical            No family history on file.     History   Social History   ??? Marital Status: Divorced     Spouse Name: N/A     Number of Children: N/A   ??? Years of Education: N/A   Occupational History   ??? Not on file.   Social History Main Topics   ??? Tobacco Use: Never   ??? Alcohol Use: No   ??? Drug Use: No   ??? Sexually Active: Yes -- Female partner(s)     Birth Control/ Protection: Surgical   Other Topics Concern   ??? Not on file   Social History Narrative   ??? No narrative on file            ALLERGIES: Codeine, Pcn, Prednisone, Ketorolac tromethamine, Shellfish, Iv dye, iodine containing, Influenza virus vaccine, Pneumovax 23, Morphine, Dilaudid, Demerol and Motrin      Review of Systems   Constitutional: Positive for activity change, appetite change and fatigue. Negative for fever, chills and diaphoresis.   Respiratory: Negative for cough.    Cardiovascular: Negative for chest pain.   Gastrointestinal: Positive for nausea, vomiting and abdominal pain. Negative for diarrhea.   Genitourinary: Negative for dysuria, flank pain, vaginal bleeding and vaginal discharge.   Musculoskeletal: Negative for myalgias.   Neurological: Negative for dizziness and headaches.   All other systems reviewed and are negative.        Filed Vitals:    02/27/2008  11:07 AM   BP: 111/73   Pulse: 104   Temp: 98.2 ??F (36.8 ??C)   Resp: 16   Height: 5\' 4"  (1.626 m)   Weight: 141 lb (63.957 kg)   SpO2: 98%              Physical Exam   Constitutional: She appears well-developed. She appears distressed (pitifully tearful).   HENT:   Head: Normocephalic.   Eyes: Pupils are equal, round, and reactive to light. No scleral icterus.   Neck: Neck supple.   Cardiovascular: Regular rhythm and normal heart sounds.  Tachycardia present.    Pulmonary/Chest: Effort normal and breath sounds normal.   Abdominal: Soft. Bowel sounds are normal. She exhibits no distension. Tenderness is present in the right upper quadrant. She has no rigidity, no rebound, no guarding, no CVA tenderness and no pain at McBurney's point.   Musculoskeletal: She exhibits no edema.   Neurological: She is alert.   Skin: Skin is warm.   Psychiatric: Her mood appears anxious. Her speech is rapid and/or pressured. She is agitated.            MDM Coding   Reviewed: previous chart  Reviewed previous: labs  Interpretation: labs      Room Air Oxygen saturation normal, no intervention necessary    Cbc, cmp, lip = wnl    Procedures

## 2008-02-27 NOTE — ED Notes (Signed)
Lab at bedside. Unsuccessful blood draw. 2nd attempt with second lab tech to follow

## 2008-02-27 NOTE — ED Notes (Signed)
Advised of delay for a room. Also advised if they feel their condition changes, or they request a recheck, to please present to registration and I will gladly re-assess.

## 2008-02-27 NOTE — ED Notes (Signed)
Delay to dispo d/t difficult blood draw.   Await CBC.   Lab called.  2nd tech to attempt.

## 2008-02-27 NOTE — ED Notes (Signed)
Resting quietly, playing a hand-held poker game, NAD.    Results reviewed.  Patient verbalizes understanding of and is agreeable to discharge plan of care.

## 2008-04-03 MED ADMIN — HYDROmorphone (PF) (DILAUDID) injection 1 mg: INTRAMUSCULAR | @ 20:00:00 | NDC 00409255201

## 2008-04-03 MED ADMIN — ondansetron (ZOFRAN) injection 4 mg: INTRAMUSCULAR | @ 20:00:00 | NDC 00143989105

## 2008-04-03 MED FILL — HYDROMORPHONE (PF) 1 MG/ML IJ SOLN: 1 mg/mL | INTRAMUSCULAR | Qty: 1

## 2008-04-03 MED FILL — ONDANSETRON (PF) 4 MG/2 ML INJECTION: 4 mg/2 mL | INTRAMUSCULAR | Qty: 2

## 2008-04-03 NOTE — ED Notes (Signed)
Advised of delay for a room. Also advised if they feel their condition changes, or they request a recheck, to please present to registration and I will gladly re-assess.

## 2008-04-03 NOTE — ED Notes (Signed)
Pt states the dilaudid, demerol, morphine only cause slight itching with IV dosing.

## 2008-04-03 NOTE — ED Notes (Signed)
Pt eloped after meds given.

## 2008-04-03 NOTE — ED Provider Notes (Addendum)
HPI Comments: Krista Lopez is a 43 y.o. Caucasian female in NAD presents with c/o typical migraine HA onset 2 days ago and not responding to therapy at home. Pt now has N&V. The chart states the pt is allergic to various pain meds but pt denies this.      Patient is a 43 y.o. female presenting with headaches. [name]   Headache   This is a recurrent problem. The current episode started 2 days ago. The problem occurs constantly. The problem has been gradually worsening. The headache is aggravated by nothing. The pain is located in the right unilateral region. The quality of the pain is described as throbbing. The pain is at a severity of 10/10. Associated symptoms include nausea and vomiting. Pertinent negatives include no near-syncope and no visual change. Krista Lopez has tried triptan therapy for the symptoms. The treatment provided no relief.        Past Medical History   Diagnosis Date   ??? Pulmonary Embolism    ??? Factor V (Leiden) Deficiency    ??? Kidney Calculi    ??? Thromboembolus      pulmonary emboli x 15   ??? Cholecystitis with Cholelithiasis    ??? Chronic Kidney Disease      Renal stones   ??? Autoimmune Disease      factor 5 defficiency   ??? Sleep Disorder      problems sleeping   ??? Neurological Disorder      hx migraines   ??? Infectious Disease      MRSA- blood cultures   ??? Other Ill-Defined Conditions      port right subclavian, hx kidney stones          Past Surgical History   Procedure Date   ??? Cystoscopy    ??? Lithotripsy    ??? Hx vascular access      port to right chest s/p 5 years.   ??? Hx appendectomy    ??? Hx gyn      cysts from ovaries, c-section, tubal ligation   ??? Hx tubal ligation    ??? Hx tubal ligation    ??? Hx urological      for stones   ??? Hx heent    ??? Hx tonsillectomy    ??? Hx other surgical            No family history on file.     History   Social History   ??? Marital Status: Divorced     Spouse Name: N/A     Number of Children: N/A   ??? Years of Education: N/A    Occupational History   ??? Not on file.   Social History Main Topics   ??? Tobacco Use: Never   ??? Alcohol Use: No   ??? Drug Use: No   ??? Sexually Active: Yes -- Female partner(s)     Birth Control/ Protection: Surgical   Other Topics Concern   ??? Not on file   Social History Narrative   ??? No narrative on file           ALLERGIES: Codeine, Pcn, Prednisone, Ketorolac tromethamine, Shellfish, Iv dye, iodine containing, Influenza virus vaccine, Pneumovax 23, Morphine, Dilaudid, Demerol and Motrin      Review of Systems   Gastrointestinal: Positive for nausea and vomiting.   Neurological: Positive for headaches.   All other systems reviewed and are negative.        Filed Vitals:  04/03/2008 12:05 PM   BP: 131/76   Pulse: 90   Temp: 98.6 ??F (37 ??C)   Resp: 18   Height: 5\' 4"  (1.626 m)   Weight: 157 lb (71.215 kg)   SpO2: 99%              Physical Exam   Nursing note and vitals reviewed.  Constitutional: Krista Lopez is oriented. Krista Lopez appears well-developed and well-nourished. No distress.   HENT:   Head: Normocephalic and atraumatic.   Right Ear: External ear normal.   Left Ear: External ear normal.   Nose: Nose normal.   Mouth/Throat: Oropharynx is clear and moist.   Eyes: Conjunctivae and extraocular motions are normal. No scleral icterus.   Neck: Normal range of motion and full passive range of motion without pain. Neck supple. No rigidity. No edema, no erythema and normal range of motion present.   Cardiovascular: Normal rate, regular rhythm and intact distal pulses.    Pulmonary/Chest: Effort normal and breath sounds normal. No respiratory distress.   Musculoskeletal: Normal range of motion. Krista Lopez exhibits no tenderness.   Neurological: Krista Lopez is alert and oriented. Krista Lopez has normal strength. No cranial nerve deficit or sensory deficit. Krista Lopez displays a negative Romberg sign.   Skin: Skin is warm and dry. No rash noted.    Psychiatric: Krista Lopez has a normal mood and affect. Her behavior is normal. Judgment and thought content normal. Cognition and memory are normal.            Coding      Procedures

## 2008-04-29 MED ADMIN — promethazine (PHENERGAN) injection 25 mg: INTRAMUSCULAR | @ 22:00:00 | NDC 00641092821

## 2008-04-29 MED ADMIN — HYDROmorphone (PF) (DILAUDID) injection 1 mg: INTRAMUSCULAR | @ 22:00:00 | NDC 00409255201

## 2008-04-29 MED FILL — HYDROMORPHONE (PF) 1 MG/ML IJ SOLN: 1 mg/mL | INTRAMUSCULAR | Qty: 1

## 2008-04-29 MED FILL — PROMETHAZINE 25 MG/ML INJECTION: 25 mg/mL | INTRAMUSCULAR | Qty: 1

## 2008-04-29 NOTE — ED Notes (Signed)
Encouraged patient to stop using the ER as a doctor's office for a chronic problem.

## 2008-04-29 NOTE — ED Notes (Signed)
Pt and family given dc instructions- verbalized understanding- pt instructed no driving- verbalized understanding

## 2008-04-29 NOTE — ED Provider Notes (Signed)
HPI Comments: Krista Lopez is a 43 y.o. Caucasian female who c/o gradual onset, typical migraine headaches that started yesteray. + n/v.  Threw up her imitrex. Requesting referrel to a neurologist. PCP= Dr. Cherly Hensen at [facility]. No n/t/w. No fever.          Patient is a 44 y.o. female presenting with headaches. [name]   Migraine          Past Medical History   Diagnosis Date   ??? Pulmonary Embolism    ??? Factor V (Leiden) Deficiency    ??? Kidney Calculi    ??? Thromboembolus      pulmonary emboli x 15   ??? Cholecystitis with Cholelithiasis    ??? Chronic Kidney Disease      Renal stones   ??? Autoimmune Disease      factor 5 defficiency   ??? Sleep Disorder      problems sleeping   ??? Neurological Disorder      hx migraines   ??? Infectious Disease      MRSA- blood cultures   ??? Other Ill-Defined Conditions      port right subclavian, hx kidney stones          Past Surgical History   Procedure Date   ??? Cystoscopy    ??? Lithotripsy    ??? Hx vascular access      port to right chest s/p 5 years.   ??? Hx appendectomy    ??? Hx gyn      cysts from ovaries, c-section, tubal ligation   ??? Hx tubal ligation    ??? Hx tubal ligation    ??? Hx urological      for stones   ??? Hx heent    ??? Hx tonsillectomy    ??? Hx other surgical            No family history on file.     History   Social History   ??? Marital Status: Divorced     Spouse Name: N/A     Number of Children: N/A   ??? Years of Education: N/A   Occupational History   ??? Not on file.   Social History Main Topics   ??? Tobacco Use: Never   ??? Alcohol Use: No   ??? Drug Use: No   ??? Sexually Active: Yes -- Female partner(s)     Birth Control/ Protection: Surgical   Other Topics Concern   ??? Not on file   Social History Narrative   ??? No narrative on file           ALLERGIES: Codeine, Pcn, Prednisone, Ketorolac tromethamine, Shellfish, Iv dye, iodine containing, Influenza virus vaccine, Pneumovax 23, Morphine, Dilaudid, Demerol and Motrin      Review of Systems    Neurological: Positive for headaches.   All other systems reviewed and are negative.        Filed Vitals:    04/29/2008  4:29 PM   BP: 99/75   Pulse: 83   Temp: 98.1 ??F (36.7 ??C)   Resp: 18   Height: 5\' 4"  (1.626 m)   Weight: 154 lb (69.854 kg)   SpO2: 97%              Physical Exam   Nursing note and vitals reviewed.  Constitutional: She is oriented. She appears well-developed and well-nourished. No distress.   HENT:   Head: Normocephalic and atraumatic.   Right Ear: External  ear normal.   Left Ear: External ear normal.   Mouth/Throat: Oropharynx is clear and moist.   Eyes: Conjunctivae and extraocular motions are normal. Pupils are equal, round, and reactive to light.   Neck: Normal range of motion. Neck supple.   Cardiovascular: Normal rate.    Pulmonary/Chest: Effort normal.   Musculoskeletal: Normal range of motion.   Neurological: She is alert and oriented. No cranial nerve deficit. She exhibits normal muscle tone. Coordination normal.   Skin: Skin is warm and dry. She is not diaphoretic.   Psychiatric: She has a normal mood and affect. Her behavior is normal.            Coding      Procedures

## 2008-04-29 NOTE — ED Notes (Signed)
Pt here c/o "migraine" headache x 2 days

## 2008-05-29 NOTE — ED Notes (Signed)
Pt says she is NOT actually allergic to morphine, Demerol, or Dilaudid when given IV.

## 2008-05-29 NOTE — ED Notes (Signed)
Pt c/o chest pain radiating into back.  Has hx of PE, states "it feels like the pain when I had my blood clot. "

## 2008-05-29 NOTE — ED Provider Notes (Signed)
HPI Comments: 43 y.o. female with innumerable ED visits for pain-related issues has hx PE on Coumadin. Pt states that she developed pleuritic lt sided CP with SOB earlier today. No leg pain or swelling.    Patient is a 43 y.o. female presenting with chest pain. The history is provided by the patient and medical records.   Chest Pain   This is a recurrent problem. The current episode started 6 to 12 hours ago. The problem has not changed since onset. The problem occurs constantly. The pain is associated with normal activity. The pain is present in the left side. The pain is at a severity of 7/10. The pain is moderate. The quality of the pain is described as pleuritic. The pain does not radiate. The symptoms are aggravated by deep breathing. Associated symptoms include shortness of breath. Pertinent negatives include no diaphoresis, no exertional chest pressure, no vomiting, no leg pain, no lower extremity edema and no cough. She has tried nothing for the symptoms. Her past medical history is significant for PE.       Past Medical History   Diagnosis Date   ??? Pulmonary embolism    ??? Factor V (Leiden) deficiency    ??? Kidney calculi    ??? Thromboembolus      pulmonary emboli x 15   ??? Cholecystitis with cholelithiasis    ??? Chronic kidney disease      Renal stones   ??? Autoimmune disease      factor 5 defficiency   ??? Sleep disorder      problems sleeping   ??? Neurological disorder      hx migraines   ??? Infectious disease      MRSA- blood cultures   ??? Other ill-defined conditions      port right subclavian, hx kidney stones          Past Surgical History   Procedure Date   ??? Cystoscopy    ??? Lithotripsy    ??? Hx vascular access      port to right chest s/p 5 years.   ??? Hx appendectomy    ??? Hx gyn      cysts from ovaries, c-section, tubal ligation   ??? Hx tubal ligation    ??? Hx tubal ligation    ??? Hx urological      for stones   ??? Hx heent    ??? Hx tonsillectomy    ??? Hx other surgical            No family history on file.      History   Social History   ??? Marital Status: Legally Separated     Spouse Name: N/A     Number of Children: N/A   ??? Years of Education: N/A   Occupational History   ??? Not on file.   Social History Main Topics   ??? Tobacco Use: Never   ??? Alcohol Use: No   ??? Drug Use: No   ??? Sexually Active: Yes -- Female partner(s)     Birth Control/ Protection: Surgical   Other Topics Concern   ??? Not on file   Social History Narrative   ??? No narrative on file           ALLERGIES: Codeine, Pcn, Prednisone, Ketorolac tromethamine, Shellfish, Iv dye, iodine containing, Influenza virus vaccine, Pneumovax 23, Morphine, Dilaudid, Demerol and Motrin      Review of Systems   Constitutional: Negative for diaphoresis.   HENT: Negative  for congestion.    Respiratory: Positive for shortness of breath. Negative for cough.    Cardiovascular: Positive for chest pain.   Gastrointestinal: Negative for vomiting.   Musculoskeletal: Negative for joint swelling.   Skin: Negative for rash.   Psychiatric/Behavioral: The patient is nervous/anxious.        Filed Vitals:    05/29/2008 10:17 PM 05/29/2008 10:19 PM   BP:  128/81   Pulse:  78   Temp: 98.2 ??F (36.8 ??C)    Resp:  18   Height:  5\' 4"  (1.626 m)   Weight:  152 lb (68.947 kg)   SpO2:  97%              Physical Exam   Nursing note and vitals reviewed.  Constitutional: She is oriented. She appears well-developed and well-nourished.   HENT:   Head: Normocephalic.   Eyes: Conjunctivae and extraocular motions are normal. Pupils are equal, round, and reactive to light.   Neck: Normal range of motion.   Cardiovascular: Normal rate.    Pulmonary/Chest: Effort normal and breath sounds normal. No respiratory distress. She exhibits no tenderness.   Abdominal: Soft. No tenderness.   Musculoskeletal: Normal range of motion. She exhibits no edema and no tenderness.   Neurological: She is alert and oriented.   Skin: Skin is warm and dry. No rash noted.   Psychiatric: Her mood appears anxious.            MDM Coding    Reviewed: previous chart, nursing note and vitals  Interpretation: labs, ECG and x-ray          Procedures

## 2008-05-29 NOTE — ED Notes (Signed)
CXR neg

## 2008-05-30 LAB — D-DIMER, QUANTITATIVE: D-Dimer, Quant: 0.47 ug/ml(FEU) (ref <0.55)

## 2008-05-30 LAB — METABOLIC PANEL, BASIC
Anion gap: 7 mmol/L (ref 7–16)
BUN: 20 MG/DL — ABNORMAL HIGH (ref 7–18)
CO2: 26 MMOL/L (ref 21–32)
Calcium: 9.4 MG/DL (ref 8.4–10.4)
Chloride: 104 MMOL/L (ref 98–107)
Creatinine: 1 MG/DL (ref 0.6–1.0)
GFR est AA: >60 mL/min/{1.73_m2} (ref >60)
GFR est non-AA: >60 mL/min/{1.73_m2} (ref >60)
Glucose: 104 MG/DL (ref 74–106)
Potassium: 3.9 MMOL/L (ref 3.5–5.1)
Sodium: 137 MMOL/L (ref 136–145)

## 2008-05-30 LAB — CBC WITH AUTOMATED DIFF
ABS. BASOPHILS: 0 10*3/uL (ref 0.0–0.2)
ABS. EOSINOPHILS: 0.3 10*3/uL (ref 0.0–0.8)
ABS. IMM. GRANS.: 0 10*3/uL (ref 0.0–2.0)
ABS. LYMPHOCYTES: 2.1 10*3/uL (ref 0.5–4.6)
ABS. MONOCYTES: 0.3 10*3/uL (ref 0.1–1.3)
ABS. NEUTROPHILS: 1.9 10*3/uL (ref 1.7–8.2)
BASOPHILS: 1 % (ref 0.0–2.0)
EOSINOPHILS: 6 % (ref 0.5–7.8)
HCT: 36.4 % — ABNORMAL LOW (ref 37.6–48.3)
HGB: 12.1 g/dL (ref 11.7–15.0)
IMMATURE GRANULOCYTES: 0.2 % (ref 0.0–2.0)
LYMPHOCYTES: 45 % — ABNORMAL HIGH (ref 13–44)
MCH: 30.3 PG (ref 26.1–32.9)
MCHC: 33.2 g/dL (ref 31.4–35.0)
MCV: 91 FL (ref 79.6–97.8)
MONOCYTES: 7 % (ref 4.0–12.0)
MPV: 10.2 FL — ABNORMAL LOW (ref 10.8–14.1)
NEUTROPHILS: 41 % — ABNORMAL LOW (ref 43–78)
PLATELET: 227 10*3/uL (ref 140–440)
RBC: 4 M/uL (ref 3.86–5.18)
RDW: 12.4 % (ref 11.9–14.6)
WBC: 4.6 10*3/uL (ref 4.0–10.5)

## 2008-05-30 LAB — D DIMER: D DIMER: 0.47 ug/ml(FEU) (ref <0.55)

## 2008-05-30 LAB — CK WITH MB
CK - MB: <0.5 ng/ml (ref <5.0)
CK: 67 U/L (ref 21–215)

## 2008-05-30 LAB — TROPONIN I: Troponin-I, Qt.: 0.04 NG/ML (ref 0.04–0.05)

## 2008-05-30 LAB — PTT: aPTT: 21.4 s — ABNORMAL LOW (ref 23.5–31.7)

## 2008-05-30 LAB — BNP: BNP: 9 pg/mL

## 2008-05-30 LAB — PROTHROMBIN TIME + INR
INR: 1.2 (ref 0.9–1.2)
Prothrombin time: 11.7 SECS — ABNORMAL HIGH (ref 9.4–10.8)

## 2008-05-30 MED ORDER — TRAMADOL 50 MG TAB
50 mg | ORAL_TABLET | Freq: Four times a day (QID) | ORAL | Status: AC | PRN
Start: 2008-05-30 — End: 2008-06-09

## 2008-05-30 MED ADMIN — HYDROmorphone (PF) (DILAUDID) injection 1 mg: INTRAVENOUS | @ 04:00:00 | NDC 00409255201

## 2008-05-30 MED FILL — HYDROMORPHONE (PF) 1 MG/ML IJ SOLN: 1 mg/mL | INTRAMUSCULAR | Qty: 1

## 2008-05-30 MED FILL — ONDANSETRON (PF) 4 MG/2 ML INJECTION: 4 mg/2 mL | INTRAMUSCULAR | Qty: 2

## 2008-05-30 NOTE — ED Notes (Signed)
[facility] states they cannot perform V/Q scan until AM. Pt obtained relief with Dilaudid. She thinks her pain may be from pleurisy which she has had before and she wants to go home and return for study in the morning.. Pt informed that if she leaves she would have to be re-registered and start from scratch in ED.

## 2008-05-30 NOTE — ED Notes (Signed)
I have reviewed discharge instructions with the patient.  The patient verbalized understanding. Patient ambulates out of ED without assist. NAD noted at this time.

## 2008-06-06 LAB — URINE MICROSCOPIC
Bacteria: NEGATIVE /HPF
Casts: 0 /LPF
Mucus: 0 /LPF

## 2008-06-06 MED ADMIN — hyoscyamine SL (LEVSIN/SL) tablet 0.125 mg: SUBLINGUAL | @ 23:00:00 | NDC 24486060110

## 2008-06-06 MED FILL — HYOMAX-SL 0.125 MG SUBLINGUAL TABLET: 0.125 mg | SUBLINGUAL | Qty: 1

## 2008-06-06 NOTE — ED Provider Notes (Signed)
HPI Comments: Patient presents to ed with right upper quadrant pain that began several days ago. The patient at [facility] today. She was sent to ed for evalution of gall bladder. Per hospital records she has been dx with gall stones. The patient denies this dx. Patient has been to ed multiple times for pain management.     Patient is a 43 y.o. female presenting with abdominal pain. [name] No language interpreter was used.   Abdominal Pain   This is a new problem. The current episode started more than 2 days ago. The problem occurs constantly. The problem has not changed since onset. The pain is associated with vomiting and eating. The pain is located in the epigastric region. The quality of the pain is sharp. The pain is at a severity of 8/10. Associated symptoms include nausea and vomiting. Pertinent negatives include no fever. The pain is worsened by eating. The pain is relieved by nothing. Past workup includes ultrasound. gall bladder sludge Her past medical history is significant for kidney stones. The patient's surgical history includes appendectomy.       Past Medical History   Diagnosis Date   ??? Pulmonary embolism    ??? Factor V (Leiden) deficiency    ??? Kidney calculi    ??? Thromboembolus      pulmonary emboli x 15   ??? Cholecystitis with cholelithiasis    ??? Chronic kidney disease      Renal stones   ??? Autoimmune disease      factor 5 defficiency   ??? Sleep disorder      problems sleeping   ??? Neurological disorder      hx migraines   ??? Infectious disease      MRSA- blood cultures   ??? Other ill-defined conditions      port right subclavian, hx kidney stones          Past Surgical History   Procedure Date   ??? Cystoscopy    ??? Lithotripsy    ??? Hx vascular access      port to right chest s/p 5 years.   ??? Hx appendectomy    ??? Hx gyn      cysts from ovaries, c-section, tubal ligation   ??? Hx tubal ligation    ??? Hx tubal ligation    ??? Hx urological      for stones   ??? Hx heent     ??? Hx tonsillectomy    ??? Hx other surgical            No family history on file.     History   Social History   ??? Marital Status: Legally Separated     Spouse Name: N/A     Number of Children: N/A   ??? Years of Education: N/A   Occupational History   ??? Not on file.   Social History Main Topics   ??? Tobacco Use: Never   ??? Alcohol Use: No   ??? Drug Use: No   ??? Sexually Active: Yes -- Female partner(s)     Birth Control/ Protection: Surgical   Other Topics Concern   ??? Not on file   Social History Narrative   ??? No narrative on file           ALLERGIES: Codeine, Pcn, Prednisone, Ketorolac tromethamine, Shellfish, Iv dye, iodine containing, Influenza virus vaccine, Pneumovax 23, Morphine, Dilaudid, Demerol and Motrin      Review of  Systems   Constitutional: Negative for fever.   Gastrointestinal: Positive for nausea, vomiting and abdominal pain.   All other systems reviewed and are negative.        Filed Vitals:    06/06/2008  6:18 PM   BP: 114/69   Pulse: 105   Temp: 97.6 ??F (36.4 ??C)   Resp: 18   Height: 5\' 4"  (1.626 m)   Weight: 159 lb (72.122 kg)   SpO2: 96%              Physical Exam   Nursing note and vitals reviewed.  Constitutional: She is oriented. She appears well-developed and well-nourished.   HENT:   Head: Normocephalic and atraumatic.   Right Ear: External ear normal.   Left Ear: External ear normal.   Nose: Nose normal.   Mouth/Throat: Oropharynx is clear and moist.   Eyes: Conjunctivae and extraocular motions are normal. Pupils are equal, round, and reactive to light.   Neck: Normal range of motion. Neck supple.   Cardiovascular: Normal rate, regular rhythm, normal heart sounds and intact distal pulses.    Pulmonary/Chest: Effort normal and breath sounds normal. No respiratory distress. She has no wheezes.   Abdominal: Soft. Bowel sounds are normal. Tenderness is present.   Musculoskeletal: Normal range of motion. She exhibits no edema and no tenderness.    Neurological: She is alert and oriented. No cranial nerve deficit. She exhibits normal muscle tone.   Skin: Skin is warm and dry.   Psychiatric: She has a normal mood and affect. Her behavior is normal. Judgment and thought content normal.            MDM Coding   Reviewed: previous chart, nursing note and vitals          Procedures

## 2008-06-06 NOTE — ED Notes (Signed)
Unable to locate patient.

## 2008-06-06 NOTE — ED Notes (Signed)
Phlebotomy to come draw blood work.

## 2008-07-16 LAB — D-DIMER, QUANTITATIVE: D-Dimer, Quant: 0.19 ug/ml(FEU) (ref <0.55)

## 2008-07-16 LAB — PROTHROMBIN TIME + INR
INR: >9 — CR (ref 0.9–1.2)
Prothrombin time: >82 SECS — ABNORMAL HIGH (ref 9.4–10.8)

## 2008-07-16 LAB — METABOLIC PANEL, BASIC
Anion gap: 9 mmol/L (ref 7–16)
BUN: 15 MG/DL (ref 7–18)
CO2: 25 MMOL/L (ref 21–32)
Calcium: 9 MG/DL (ref 8.4–10.4)
Chloride: 106 MMOL/L (ref 98–107)
Creatinine: 0.8 mg/dL (ref 0.6–1.0)
GFR est AA: >60 mL/min/{1.73_m2} (ref >60)
GFR est non-AA: >60 mL/min/{1.73_m2} (ref >60)
Glucose: 126 MG/DL — ABNORMAL HIGH (ref 74–106)
Potassium: 4.5 MMOL/L (ref 3.5–5.1)
Sodium: 140 MMOL/L (ref 136–145)

## 2008-07-16 LAB — CBC WITH AUTOMATED DIFF
ABS. BASOPHILS: 0 10*3/uL (ref 0.0–0.2)
ABS. EOSINOPHILS: 0.4 10*3/uL (ref 0.0–0.8)
ABS. IMM. GRANS.: 0 10*3/uL (ref 0.0–2.0)
ABS. LYMPHOCYTES: 1.5 10*3/uL (ref 0.5–4.6)
ABS. MONOCYTES: 0.4 10*3/uL (ref 0.1–1.3)
ABS. NEUTROPHILS: 3.5 10*3/uL (ref 1.7–8.2)
BASOPHILS: 0 % (ref 0.0–2.0)
EOSINOPHILS: 6 % (ref 0.5–7.8)
HCT: 37.4 % — ABNORMAL LOW (ref 37.6–48.3)
HGB: 12.6 g/dL (ref 11.7–15.0)
IMMATURE GRANULOCYTES: 0.2 % (ref 0.0–2.0)
LYMPHOCYTES: 26 % (ref 13–44)
MCH: 30.7 PG (ref 26.1–32.9)
MCHC: 33.7 g/dL (ref 31.4–35.0)
MCV: 91 FL (ref 79.6–97.8)
MONOCYTES: 7 % (ref 4.0–12.0)
MPV: 10.3 FL — ABNORMAL LOW (ref 10.8–14.1)
NEUTROPHILS: 61 % (ref 43–78)
PLATELET: 254 10*3/uL (ref 140–440)
RBC: 4.11 M/uL (ref 3.86–5.18)
RDW: 13.5 % (ref 11.9–14.6)
WBC: 5.8 10*3/uL (ref 4.0–10.5)

## 2008-07-16 LAB — METABOLIC PANEL, COMPREHENSIVE
A-G Ratio: 1.1 — ABNORMAL LOW (ref 1.2–3.5)
ALT (SGPT): 32 U/L — ABNORMAL LOW (ref 39–65)
AST (SGOT): 22 U/L (ref 15–37)
Albumin: 4.2 g/dL (ref 3.5–5.0)
Alk. phosphatase: 145 U/L — ABNORMAL HIGH (ref 50–136)
Anion gap: 8 mmol/L (ref 7–16)
BUN: 15 MG/DL (ref 7–18)
Bilirubin, total: 0.3 MG/DL (ref 0.2–1.1)
CO2: 24 MMOL/L (ref 21–32)
Calcium: 9 MG/DL (ref 8.4–10.4)
Chloride: 105 MMOL/L (ref 98–107)
Creatinine: 1 mg/dL (ref 0.6–1.0)
GFR est AA: >60 mL/min/{1.73_m2} (ref >60)
GFR est non-AA: >60 mL/min/{1.73_m2} (ref >60)
Globulin: 3.8 g/dL — ABNORMAL HIGH (ref 2.3–3.5)
Glucose: 118 MG/DL — ABNORMAL HIGH (ref 74–106)
Potassium: 4 MMOL/L (ref 3.5–5.1)
Protein, total: 8 g/dL (ref 6.3–8.2)
Sodium: 137 MMOL/L (ref 136–145)

## 2008-07-16 LAB — D DIMER: D DIMER: 0.19 ug/ml(FEU) (ref <0.55)

## 2008-07-16 LAB — PTT: aPTT: 89.2 s — ABNORMAL HIGH (ref 23.5–31.7)

## 2008-07-16 MED ADMIN — phytonadione (MEPHYTON) tablet 5 mg: ORAL | @ 18:00:00 | NDC 25010040515

## 2008-07-16 MED ADMIN — ondansetron (ZOFRAN) injection 4 mg: INTRAVENOUS | @ 16:00:00 | NDC 00143989105

## 2008-07-16 MED FILL — ONDANSETRON (PF) 4 MG/2 ML INJECTION: 4 mg/2 mL | INTRAMUSCULAR | Qty: 2

## 2008-07-16 MED FILL — MEPHYTON 5 MG TABLET: 5 mg | ORAL | Qty: 1

## 2008-07-16 NOTE — ED Provider Notes (Signed)
HPI Comments: Patient presents to ed with sob/chest pain since 9 am today. 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 She has greenfield filter for 4 or 5 years and has been on coumadin since early 2000's. She is complaining of nausea.     Patient is a 43 y.o. female presenting with chest pain. [name] No language interpreter was used.   Chest Pain (Angina)   This is a new problem. The current episode started 3 to 5 hours ago (9 am). The problem has not changed since onset. The problem occurs constantly. The pain is associated with normal activity and walking. The pain is present in the substernal region. The pain is at a severity of 8/10. The pain is severe. The quality of the pain is described as pressure-like. The pain does not radiate. The symptoms are aggravated by exertion. Associated symptoms include dizziness, weakness and shortness of breath. She has tried rest for the symptoms. The treatment provided no relief. Risk factors: PE. Her past medical history is significant for PE.has she had 12Procedural history includes greenfield stents.       Past Medical History   Diagnosis Date   ??? Pulmonary embolism    ??? Factor V (Leiden) deficiency    ??? Kidney calculi    ??? Thromboembolus      pulmonary emboli x 15   ??? Cholecystitis with cholelithiasis    ??? Chronic kidney disease      Renal stones   ??? Autoimmune disease      factor 5 defficiency   ??? Sleep disorder      problems sleeping   ??? Neurological disorder      hx migraines   ??? Infectious disease      MRSA- blood cultures   ??? Other ill-defined conditions      hx kidney stones          Past Surgical History   Procedure Date   ??? Cystoscopy    ??? Lithotripsy    ??? Hx vascular access      port to right chest s/p 5 years.   ??? Hx appendectomy    ??? Hx gyn      cysts from ovaries, c-section, tubal ligation   ??? Hx tubal ligation    ??? Hx tubal ligation    ??? Hx urological      for stones   ??? Hx heent    ??? Hx tonsillectomy    ??? Hx other surgical             No family history on file.     History   Social History   ??? Marital Status: Legally Separated     Spouse Name: N/A     Number of Children: N/A   ??? Years of Education: N/A   Occupational History   ??? Not on file.   Social History Main Topics   ??? Tobacco Use: Never   ??? Alcohol Use: No   ??? Drug Use: No   ??? Sexually Active: Yes -- Female partner(s)     Birth Control/ Protection: Surgical   Other Topics Concern   ??? Not on file   Social History Narrative   ??? No narrative on file           ALLERGIES: Codeine, Pcn, Prednisone, Ketorolac tromethamine, Shellfish, Iv dye, iodine containing, Influenza virus vaccine, Pneumovax 23, Morphine, Dilaudid, Demerol and Motrin      Review of Systems  Respiratory: Positive for shortness of breath.    Cardiovascular: Positive for chest pain.   Neurological: Positive for dizziness and weakness.   All other systems reviewed and are negative.        Filed Vitals:    07/16/2008 11:46 AM   BP: 137/71   Pulse: 81   Temp: 99.2 ??F (37.3 ??C)   Resp: 22   Height: 5\' 4"  (1.626 m)   Weight: 151 lb (68.493 kg)   SpO2: 100%              Physical Exam   Nursing note and vitals reviewed.  Constitutional: She is oriented. She appears well-developed and well-nourished.   HENT:   Head: Normocephalic and atraumatic.   Right Ear: External ear normal.   Left Ear: External ear normal.   Nose: Nose normal.   Mouth/Throat: Oropharynx is clear and moist.   Eyes: Conjunctivae and extraocular motions are normal. Pupils are equal, round, and reactive to light.   Neck: Normal range of motion. Neck supple.   Cardiovascular: Normal rate, regular rhythm, normal heart sounds and intact distal pulses.    Pulmonary/Chest: Effort normal and breath sounds normal. No respiratory distress. She has no wheezes.   Abdominal: Soft. Bowel sounds are normal.   Musculoskeletal: Normal range of motion. She exhibits no edema and no tenderness.    Neurological: She is alert and oriented. No cranial nerve deficit. She exhibits normal muscle tone.   Skin: Skin is warm and dry.   Psychiatric: She has a normal mood and affect. Her behavior is normal. Judgment and thought content normal.            MDM Coding   Reviewed: previous chart, nursing note and vitals          Procedures

## 2008-07-16 NOTE — ED Notes (Signed)
Lab called with critical PT >82; INR 9.0. Informed Dr. Sheffield Slider.

## 2008-07-16 NOTE — ED Notes (Signed)
PT d/c in no acute distress.  Ambulatory upon d/c.  I have reviewed discharge instructions with the patient.  The patient verbalized understanding.  instructed to follow up with primary care giver and return to ER with worsening of symptoms. Verbalized understanding of d/c instructions.   Iv d/c intact.  No redness or edema.  Dressing applied to site with bleeding controlled.

## 2008-07-28 NOTE — ED Notes (Signed)
meds given per order / pt states pain is 9 out of 10.

## 2008-07-28 NOTE — ED Notes (Signed)
Blood obtained from pt by lab tech.  /  PA made aware / pt ambulatory to the restroom.

## 2008-07-28 NOTE — ED Notes (Signed)
Pt to ER c/o bruising to bilateral arms and swelling to bilateral ankles and feet.  Pt states she was taken off her Coumadin.   Pt was recently hospitalized over a week ago in [place].  Pt has hx of blood clots.  Pt evaluated by Crist Infante.  Attempted IV access x2 without success.

## 2008-07-28 NOTE — ED Notes (Signed)
Lab attempting blood draw / French Ana, PA aware of pts request for pain med.

## 2008-07-28 NOTE — ED Notes (Signed)
Per Crist Infante / lab contacted to attempt blood draw.

## 2008-07-28 NOTE — ED Notes (Signed)
[place]/6 [place] and put in hospital [place] 07/19/2008

## 2008-07-28 NOTE — ED Notes (Signed)
Pt d/c'd, ambulatory.  Pain 5 out of 10 per pt.  Prescrip and instructions given / pt verbalized understanding and e-signed out.

## 2008-07-28 NOTE — ED Notes (Signed)
Pt ambulatory in room / pt made aware of status / waiting on lab results.

## 2008-07-29 LAB — CBC WITH AUTOMATED DIFF
ABS. BASOPHILS: 0 10*3/uL (ref 0.0–0.2)
ABS. EOSINOPHILS: 0.3 10*3/uL (ref 0.0–0.8)
ABS. IMM. GRANS.: 0 10*3/uL (ref 0.0–2.0)
ABS. LYMPHOCYTES: 1.4 10*3/uL (ref 0.5–4.6)
ABS. MONOCYTES: 0.4 10*3/uL (ref 0.1–1.3)
ABS. NEUTROPHILS: 2.7 10*3/uL (ref 1.7–8.2)
BASOPHILS: 0 % (ref 0.0–2.0)
EOSINOPHILS: 6 % (ref 0.5–7.8)
HCT: 28.4 % — ABNORMAL LOW (ref 37.6–48.3)
HGB: 9.4 g/dL — ABNORMAL LOW (ref 11.7–15.0)
IMMATURE GRANULOCYTES: 0 % (ref 0.0–2.0)
LYMPHOCYTES: 28 % (ref 13–44)
MCH: 30.4 PG (ref 26.1–32.9)
MCHC: 33.1 g/dL (ref 31.4–35.0)
MCV: 91.9 FL (ref 79.6–97.8)
MONOCYTES: 9 % (ref 4.0–12.0)
MPV: 10.6 FL — ABNORMAL LOW (ref 10.8–14.1)
NEUTROPHILS: 57 % (ref 43–78)
PLATELET: 196 10*3/uL (ref 140–440)
RBC: 3.09 M/uL — ABNORMAL LOW (ref 3.86–5.18)
RDW: 15.3 % — ABNORMAL HIGH (ref 11.9–14.6)
WBC: 4.8 10*3/uL (ref 4.0–10.5)

## 2008-07-29 LAB — METABOLIC PANEL, COMPREHENSIVE
A-G Ratio: 1.1 — ABNORMAL LOW (ref 1.2–3.5)
ALT (SGPT): 44 U/L (ref 39–65)
AST (SGOT): 29 U/L (ref 15–37)
Albumin: 3.5 g/dL (ref 3.5–5.0)
Alk. phosphatase: 157 U/L — ABNORMAL HIGH (ref 50–136)
Anion gap: 10 mmol/L (ref 7–16)
BUN: 12 MG/DL (ref 7–18)
Bilirubin, total: 0.3 MG/DL (ref 0.2–1.1)
CO2: 24 MMOL/L (ref 21–32)
Calcium: 8.9 MG/DL (ref 8.4–10.4)
Chloride: 107 MMOL/L (ref 98–107)
Creatinine: 0.9 mg/dL (ref 0.6–1.0)
GFR est AA: >60 mL/min/{1.73_m2} (ref >60)
GFR est non-AA: >60 mL/min/{1.73_m2} (ref >60)
Globulin: 3.3 g/dL (ref 2.3–3.5)
Glucose: 108 MG/DL — ABNORMAL HIGH (ref 74–106)
Potassium: 4.2 MMOL/L (ref 3.5–5.1)
Protein, total: 6.8 g/dL (ref 6.3–8.2)
Sodium: 141 MMOL/L (ref 136–145)

## 2008-07-29 LAB — PROTHROMBIN TIME + INR
INR: 1 (ref 0.9–1.2)
Prothrombin time: 9.9 SECS (ref 8.9–11.9)

## 2008-07-29 LAB — PTT: aPTT: 22.4 s — ABNORMAL LOW (ref 25.3–32.9)

## 2008-07-29 MED ORDER — HYDROCODONE-ACETAMINOPHEN 5 MG-500 MG TAB
5-500 mg | ORAL_TABLET | ORAL | Status: DC | PRN
Start: 2008-07-29 — End: 2008-07-31

## 2008-07-29 MED ORDER — HYDROCODONE-ACETAMINOPHEN 10 MG-500 MG TAB
10-500 mg | ORAL_TABLET | Freq: Four times a day (QID) | ORAL | Status: DC | PRN
Start: 2008-07-29 — End: 2008-07-31

## 2008-07-29 MED ADMIN — furosemide (LASIX) tablet 40 mg: ORAL | @ 02:00:00 | NDC 00378021601

## 2008-07-29 MED ADMIN — promethazine (PHENERGAN) injection 25 mg: INTRAMUSCULAR | @ 02:00:00 | NDC 00641092821

## 2008-07-29 MED ADMIN — HYDROmorphone (PF) (DILAUDID) injection 1 mg: INTRAMUSCULAR | @ 02:00:00 | NDC 00409255201

## 2008-07-29 MED FILL — FUROSEMIDE 40 MG TAB: 40 mg | ORAL | Qty: 1

## 2008-07-29 MED FILL — HYDROMORPHONE (PF) 1 MG/ML IJ SOLN: 1 mg/mL | INTRAMUSCULAR | Qty: 1

## 2008-07-29 MED FILL — PROMETHAZINE 25 MG/ML INJECTION: 25 mg/mL | INTRAMUSCULAR | Qty: 1

## 2008-07-31 LAB — CBC WITH AUTOMATED DIFF
ABS. BASOPHILS: 0 10*3/uL (ref 0.0–0.2)
ABS. EOSINOPHILS: 0.2 10*3/uL (ref 0.0–0.8)
ABS. IMM. GRANS.: 0 10*3/uL (ref 0.0–2.0)
ABS. LYMPHOCYTES: 1.1 10*3/uL (ref 0.5–4.6)
ABS. MONOCYTES: 0.3 10*3/uL (ref 0.1–1.3)
ABS. NEUTROPHILS: 2.9 10*3/uL (ref 1.7–8.2)
BASOPHILS: 0 % (ref 0.0–2.0)
EOSINOPHILS: 5 % (ref 0.5–7.8)
HCT: 35.3 % — ABNORMAL LOW (ref 37.6–48.3)
HGB: 11.5 g/dL — ABNORMAL LOW (ref 11.7–15.0)
IMMATURE GRANULOCYTES: 0 % (ref 0.0–2.0)
LYMPHOCYTES: 25 % (ref 13–44)
MCH: 30.9 PG (ref 26.1–32.9)
MCHC: 32.6 g/dL (ref 31.4–35.0)
MCV: 94.9 FL (ref 79.6–97.8)
MONOCYTES: 6 % (ref 4.0–12.0)
MPV: 10.4 FL — ABNORMAL LOW (ref 10.8–14.1)
NEUTROPHILS: 64 % (ref 43–78)
PLATELET: 260 10*3/uL (ref 140–440)
RBC: 3.72 M/uL — ABNORMAL LOW (ref 3.86–5.18)
RDW: 15.5 % — ABNORMAL HIGH (ref 11.9–14.6)
WBC: 4.4 10*3/uL (ref 4.0–10.5)

## 2008-07-31 LAB — PROTHROMBIN TIME + INR
INR: 1 (ref 0.9–1.2)
Prothrombin time: 9.7 SECS (ref 9.4–10.8)

## 2008-07-31 MED ORDER — TRAMADOL-ACETAMINOPHEN 37.5 MG-325 MG TAB
ORAL_TABLET | Freq: Four times a day (QID) | ORAL | Status: AC | PRN
Start: 2008-07-31 — End: 2008-08-10

## 2008-07-31 MED ADMIN — acetaminophen (TYLENOL) tablet 1,000 mg: ORAL | @ 19:00:00 | NDC 00182845389

## 2008-07-31 MED ADMIN — tramadol (ULTRAM) tablet 100 mg: ORAL | @ 19:00:00 | NDC 57664037718

## 2008-07-31 MED FILL — ACETAMINOPHEN 325 MG TABLET: 325 mg | ORAL | Qty: 3

## 2008-07-31 MED FILL — TRAMADOL 50 MG TAB: 50 mg | ORAL | Qty: 2

## 2008-07-31 NOTE — ED Notes (Signed)
DC instructions given. DC teaching expressed. Patient verbalized understanding. NAD.

## 2008-07-31 NOTE — ED Notes (Signed)
The patient was observed in the ED.    Results Reviewed:      Recent Results (from the past 24 hour(s))   CBC WITH AUTOMATED DIFF    Collection Time 07/31/08  3:15 PM   Component Value Range   ??? WBC 4.4  4.0 - 10.5 (K/uL)   ??? RBC 3.72 (*) 3.86 - 5.18 (M/uL)   ??? HGB 11.5 (*) 11.7 - 15.0 (g/dL)   ??? HCT 35.3 (*) 37.6 - 48.3 (%)   ??? MCV 94.9  79.6 - 97.8 (FL)   ??? MCH 30.9  26.1 - 32.9 (PG)   ??? MCHC 32.6  31.4 - 35.0 (g/dL)   ??? RDW 15.5 (*) 11.9 - 14.6 (%)   ??? PLATELET 260  140 - 440 (K/uL)   ??? MPV 10.4 (*) 10.8 - 14.1 (FL)   ??? DF AUTOMATED     ??? NEUTROPHILS 64  43 - 78 (%)   ??? LYMPHOCYTES 25  13 - 44 (%)   ??? MONOCYTES 6  4.0 - 12.0 (%)   ??? EOSINOPHILS 5  0.5 - 7.8 (%)   ??? BASOPHILS 0  0.0 - 2.0 (%)   ??? ABSOLUTE NEUTS 2.9  1.7 - 8.2 (K/UL)   ??? ABSOLUTE LYMPHS 1.1  0.5 - 4.6 (K/UL)   ??? ABSOLUTE MONOS 0.3  0.1 - 1.3 (K/UL)   ??? ABSOLUTE EOSINS 0.2  0.0 - 0.8 (K/UL)   ??? ABSOLUTE BASOS 0.0  0.0 - 0.2 (K/UL)   ??? IMM. GRANS. 0.0  0.0 - 2.0 (%)   ??? ABS. IMM. GRANS. 0.0  0.0 - 2.0 (K/UL)   PROTHROMBIN TIME    Collection Time 07/31/08  3:15 PM   Component Value Range   ??? Prothrombin Time-PT 9.7  9.4 - 10.8 (SECS)   ??? INR 1.0  0.9 - 1.2 ( )         I discussed the results of all labs, procedures, radiographs, and treatments with the patient and available family.  Treatment plan is agreed upon and the patient is ready for discharge.  All voiced understanding of the discharge plan and medication instructions or changes as appropriate.  Questions about treatment in the ED were answered.  All were encouraged to return should symptoms worsen or new problems develop.

## 2008-07-31 NOTE — ED Notes (Signed)
Patients arms coved with multiple bruises secondary to multiple blood draws over past week. Lab contacted for blood draw.

## 2008-07-31 NOTE — ED Provider Notes (Signed)
Patient is a 43 y.o. female presenting with migraine. [name] No language interpreter was used.   Migraine   This is a new problem. The current episode started 2 days ago. The problem occurs constantly. The problem has not changed since onset. The headache is aggravated by nothing. The pain is located in the generalized region. The quality of the pain is described as dull. The pain is at a severity of 8/10. Associated symptoms include malaise/fatigue. Pertinent negatives include no fever, no shortness of breath, no weakness, no dizziness, no visual change, no nausea and no vomiting. She has tried nothing for the symptoms.        Past Medical History   Diagnosis Date   ??? Pulmonary embolism    ??? Factor V (Leiden) deficiency    ??? Kidney calculi    ??? Thromboembolus      pulmonary emboli x 15   ??? Cholecystitis with cholelithiasis    ??? Chronic kidney disease      Renal stones   ??? Autoimmune disease      factor 5 defficiency   ??? Sleep disorder      problems sleeping   ??? Neurological disorder      hx migraines   ??? Infectious disease      MRSA- blood cultures   ??? Other ill-defined conditions      hx kidney stones          Past Surgical History   Procedure Date   ??? Cystoscopy    ??? Lithotripsy    ??? Hx vascular access      port to right chest s/p 5 years.   ??? Hx appendectomy    ??? Hx gyn      cysts from ovaries, c-section, tubal ligation   ??? Hx tubal ligation    ??? Hx tubal ligation    ??? Hx urological      for stones   ??? Hx heent    ??? Hx tonsillectomy    ??? Hx other surgical            No family history on file.     History   Social History   ??? Marital Status: Legally Separated     Spouse Name: N/A     Number of Children: N/A   ??? Years of Education: N/A   Occupational History   ??? Not on file.   Social History Main Topics   ??? Tobacco Use: Never   ??? Alcohol Use: No   ??? Drug Use: No   ??? Sexually Active: Yes -- Female partner(s)     Birth Control/ Protection: Surgical   Other Topics Concern    ??? Not on file   Social History Narrative   ??? No narrative on file           ALLERGIES: Codeine, Pcn, Prednisone, Ketorolac tromethamine, Shellfish, Iv dye, iodine containing, Influenza virus vaccine, Pneumovax 23, Morphine, Dilaudid, Demerol and Motrin      Review of Systems   Constitutional: Positive for malaise/fatigue. Negative for fever and chills.   Respiratory: Negative for cough, chest tightness and shortness of breath.    Cardiovascular: Negative for chest pain.   Gastrointestinal: Negative for nausea, vomiting and abdominal pain.   Neurological: Positive for headaches. Negative for dizziness, syncope and weakness.   All other systems reviewed and are negative.        Filed Vitals:    07/31/2008 12:32 PM  BP: 151/86   Pulse: 97   Temp: 98.3 ??F (36.8 ??C)   Resp: 18   Height: 5\' 4"  (1.626 m)   Weight: 164 lb (74.39 kg)   SpO2: 98%              Physical Exam   Nursing note and vitals reviewed.  Constitutional: She is oriented to person, place, and time. She appears well-developed and well-nourished.   HENT:   Head: Normocephalic and atraumatic.   Right Ear: External ear normal.   Left Ear: External ear normal.   Nose: Nose normal.   Mouth/Throat: Oropharynx is clear and moist.   Eyes: Conjunctivae are normal. Pupils are equal, round, and reactive to light.   Neck: Normal range of motion. Neck supple.   Cardiovascular: Normal rate, regular rhythm and normal heart sounds.    Pulmonary/Chest: Effort normal and breath sounds normal. No respiratory distress. She has no wheezes. She has no rales.   Abdominal: Soft. Bowel sounds are normal. She exhibits no distension. No tenderness. She has no rebound and no guarding.   Musculoskeletal: Normal range of motion. She exhibits no edema.   Neurological: She is alert and oriented to person, place, and time. No cranial nerve deficit. Coordination normal.   Skin: Skin is warm and dry. No rash noted.   Psychiatric: She has a normal mood and affect. Her behavior is normal.             MDM Coding   Reviewed: previous chart, nursing note and vitals  Interpretation: labs          Procedures

## 2008-08-06 NOTE — ED Provider Notes (Signed)
HPI Comments: Known patient presents to the ED for bruising under skin. Patient concerned about coumadin levels abnormal. Recently seen in [place] with INR greater than 9.0. Hospitalized for several days.    Patient is a 43 y.o. female presenting with ecchymosis and ankle edema. [name]   Bleeding/Bruising  This is a recurrent problem. The current episode started more than 2 days ago. The problem occurs constantly. The problem has been gradually worsening. Pertinent negatives include no chest pain, no abdominal pain, no headaches and no shortness of breath. Nothing aggravates the symptoms. Nothing relieves the symptoms.   Ankle swelling          Past Medical History   Diagnosis Date   ??? Pulmonary embolism    ??? Factor V (Leiden) deficiency    ??? Kidney calculi    ??? Thromboembolus      pulmonary emboli x 15   ??? Cholecystitis with cholelithiasis    ??? Chronic kidney disease      Renal stones   ??? Autoimmune disease      factor 5 defficiency   ??? Sleep disorder      problems sleeping   ??? Neurological disorder      hx migraines   ??? Infectious disease      MRSA- blood cultures   ??? Other ill-defined conditions      hx kidney stones          Past Surgical History   Procedure Date   ??? Cystoscopy    ??? Lithotripsy    ??? Hx vascular access      port to right chest s/p 5 years.   ??? Hx appendectomy    ??? Hx gyn      cysts from ovaries, c-section, tubal ligation   ??? Hx tubal ligation    ??? Hx tubal ligation    ??? Hx urological      for stones   ??? Hx heent    ??? Hx tonsillectomy    ??? Hx other surgical            No family history on file.     History   Social History   ??? Marital Status: Legally Separated     Spouse Name: N/A     Number of Children: N/A   ??? Years of Education: N/A   Occupational History   ??? Not on file.   Social History Main Topics   ??? Tobacco Use: Never   ??? Alcohol Use: No   ??? Drug Use: No   ??? Sexually Active: Yes -- Female partner(s)     Birth Control/ Protection: Surgical   Other Topics Concern    ??? Not on file   Social History Narrative   ??? No narrative on file           ALLERGIES: Codeine, Pcn, Prednisone, Ketorolac tromethamine, Shellfish, Iv dye, iodine containing, Influenza virus vaccine, Pneumovax 23, Morphine, Dilaudid, Demerol and Motrin      Review of Systems   Respiratory: Negative for shortness of breath.    Cardiovascular: Negative for chest pain.   Gastrointestinal: Negative for abdominal pain.   Neurological: Negative for headaches.   All other systems reviewed and are negative.        Filed Vitals:    07/28/2008  8:15 PM 07/28/2008  8:45 PM 07/28/2008  9:00 PM 07/28/2008 10:41 PM   BP: 147/80 138/70 159/72 166/89   Pulse: 94 95 78  Temp:       Resp:       Height:       Weight:       SpO2: 95% 96% 95%               Physical Exam   Nursing note and vitals reviewed.  Constitutional: She is oriented to person, place, and time. She appears well-developed and well-nourished.   HENT:   Head: Normocephalic and atraumatic.   Right Ear: External ear normal.   Left Ear: External ear normal.   Eyes: Conjunctivae are normal. Pupils are equal, round, and reactive to light.   Neck: Normal range of motion. Neck supple.   Cardiovascular: Normal rate, regular rhythm, normal heart sounds and intact distal pulses.    Pulmonary/Chest: Effort normal and breath sounds normal.   Abdominal: Soft. Bowel sounds are normal.   Musculoskeletal: Normal range of motion.   Neurological: She is alert and oriented to person, place, and time.   Skin: Skin is warm and dry.        Various aged bruising noted on extremities. Mild edema in lower extremities.   Psychiatric: She has a normal mood and affect. Her behavior is normal. Judgment and thought content normal.            MDM Coding   Reviewed: previous chart, nursing note and vitals  Interpretation: labs          Procedures     After several attempts, blood work obtained. Reviewed resulted with patient. Felt better just to know the levels were appropriate. DC home with instructions for appropriate follow up.

## 2008-08-29 LAB — CBC WITH AUTOMATED DIFF
ABS. BASOPHILS: 0 10*3/uL (ref 0.0–0.2)
ABS. EOSINOPHILS: 0.3 10*3/uL (ref 0.0–0.8)
ABS. IMM. GRANS.: 0 10*3/uL (ref 0.0–2.0)
ABS. LYMPHOCYTES: 1.5 10*3/uL (ref 0.5–4.6)
ABS. MONOCYTES: 0.4 10*3/uL (ref 0.1–1.3)
ABS. NEUTROPHILS: 3.6 10*3/uL (ref 1.7–8.2)
BASOPHILS: 0 % (ref 0.0–2.0)
EOSINOPHILS: 5 % (ref 0.5–7.8)
HCT: 33 % — ABNORMAL LOW (ref 37.6–48.3)
HGB: 10.8 g/dL — ABNORMAL LOW (ref 11.7–15.0)
IMMATURE GRANULOCYTES: 0.5 % (ref 0.0–2.0)
LYMPHOCYTES: 25 % (ref 13–44)
MCH: 30.6 PG (ref 26.1–32.9)
MCHC: 32.7 g/dL (ref 31.4–35.0)
MCV: 93.5 FL (ref 79.6–97.8)
MONOCYTES: 7 % (ref 4.0–12.0)
MPV: 10.3 FL — ABNORMAL LOW (ref 10.8–14.1)
NEUTROPHILS: 63 % (ref 43–78)
PLATELET: 307 10*3/uL (ref 140–440)
RBC: 3.53 M/uL — ABNORMAL LOW (ref 3.86–5.18)
RDW: 14 % (ref 11.9–14.6)
WBC: 5.8 10*3/uL (ref 4.0–10.5)

## 2008-08-29 LAB — PTT: aPTT: 51.3 s — ABNORMAL HIGH (ref 23.5–31.7)

## 2008-08-29 LAB — PROTHROMBIN TIME + INR
INR: 1 (ref 0.9–1.2)
Prothrombin time: 10.5 SECS (ref 9.4–10.8)

## 2008-08-29 MED ORDER — PROPOXYPHENE N-ACETAMINOPHEN 100 MG-650 MG TAB
100-650 mg | ORAL_TABLET | Freq: Three times a day (TID) | ORAL | Status: DC | PRN
Start: 2008-08-29 — End: 2008-09-30

## 2008-08-29 MED ADMIN — propoxyphene napsylate-acetaminophen (DARVOCET-N 100) 100-650 mg per tablet 1 Tab: ORAL | @ 22:00:00 | NDC 63739021510

## 2008-08-29 MED ADMIN — heparin (porcine) pf 300 Units: @ 23:00:00

## 2008-08-29 MED FILL — PROPOXYPHENE N-ACETAMINOPHEN 100 MG-650 MG TAB: 100-650 mg | ORAL | Qty: 1

## 2008-08-29 MED FILL — HEPARIN LOCK FLUSH (PORCINE) (PF) 100 UNIT/ML INTRAVENOUS SYRINGE: 100 unit/mL | INTRAVENOUS | Qty: 3

## 2008-08-29 NOTE — ED Notes (Signed)
Pt states that she is feeling pain to the right side of her chest.

## 2008-08-29 NOTE — ED Notes (Signed)
I have reviewed discharge instructions with the patient.  The patient verbalized understanding. The patient with no questions or concerns. Patient given a medication with potential sedative effects.  Patient advised not to drink or drive for the next 8 hours. Patient given a prescription for a medication with potential sedative effects.  Patient advised not to drink or drive while taking this medication.

## 2008-08-29 NOTE — ED Notes (Signed)
Pt moved to bed B after coming back from her XRAY and MD Corbett made aware that pt is still requesting pain medication

## 2008-08-29 NOTE — ED Notes (Signed)
The patient was observed in the ED.    Results Reviewed:      Recent Results (from the past 24 hour(s))   CBC WITH AUTOMATED DIFF    Collection Time 08/29/08  5:50 PM   Component Value Range   ??? WBC 5.8  4.0 - 10.5 (K/uL)   ??? RBC 3.53 (*) 3.86 - 5.18 (M/uL)   ??? HGB 10.8 (*) 11.7 - 15.0 (g/dL)   ??? HCT 33.0 (*) 37.6 - 48.3 (%)   ??? MCV 93.5  79.6 - 97.8 (FL)   ??? MCH 30.6  26.1 - 32.9 (PG)   ??? MCHC 32.7  31.4 - 35.0 (g/dL)   ??? RDW 14.0  11.9 - 14.6 (%)   ??? PLATELET 307  140 - 440 (K/uL)   ??? MPV 10.3 (*) 10.8 - 14.1 (FL)   ??? DF AUTOMATED     ??? NEUTROPHILS 63  43 - 78 (%)   ??? LYMPHOCYTES 25  13 - 44 (%)   ??? MONOCYTES 7  4.0 - 12.0 (%)   ??? EOSINOPHILS 5  0.5 - 7.8 (%)   ??? BASOPHILS 0  0.0 - 2.0 (%)   ??? ABSOLUTE NEUTS 3.6  1.7 - 8.2 (K/UL)   ??? ABSOLUTE LYMPHS 1.5  0.5 - 4.6 (K/UL)   ??? ABSOLUTE MONOS 0.4  0.1 - 1.3 (K/UL)   ??? ABSOLUTE EOSINS 0.3  0.0 - 0.8 (K/UL)   ??? ABSOLUTE BASOS 0.0  0.0 - 0.2 (K/UL)   ??? IMM. GRANS. 0.5  0.0 - 2.0 (%)   ??? ABS. IMM. GRANS. 0.0  0.0 - 2.0 (K/UL)   PROTHROMBIN TIME    Collection Time 08/29/08  5:50 PM   Component Value Range   ??? Prothrombin Time-PT 10.5  9.4 - 10.8 (SECS)   ??? INR 1.0  0.9 - 1.2 ( )   PTT    Collection Time 08/29/08  5:50 PM   Component Value Range   ??? aPTT 51.3 (*) 23.5 - 31.7 (SEC)         I discussed the results of all labs, procedures, radiographs, and treatments with the patient and available family.  Treatment plan is agreed upon and the patient is ready for discharge.  All voiced understanding of the discharge plan and medication instructions or changes as appropriate.  Questions about treatment in the ED were answered.  All were encouraged to return should symptoms worsen or new problems develop.

## 2008-08-29 NOTE — ED Provider Notes (Signed)
HPI Comments: Just discharged from [facility].  Admitted for PE    Patient is a 43 y.o. female presenting with chest pain. [name] No language interpreter was used.   Chest Pain (Angina)   This is a recurrent problem. The current episode started 6 to 12 hours ago. The problem has not changed since onset. Duration of episode(s) is 8 hours. The problem occurs constantly. The pain is at a severity of 2/10. The quality of the pain is described as sharp and stabbing. The pain radiates to the mid back. The symptoms are aggravated by deep breathing, movement and palpation. Associated symptoms include back pain and shortness of breath. Pertinent negatives include no diaphoresis, no fever, no numbness, no irregular heartbeat, no palpitations, no abdominal pain, no nausea, no vomiting and no weakness. She has tried nothing for the symptoms. Her past medical history is significant for PE.Her past medical history does not include cancer or CHF. Procedural history includes greenfield stents.Pertinent negatives include no cardiac catheterization and no cardiac stents.       Past Medical History   Diagnosis Date   ??? Pulmonary embolism    ??? Factor V (Leiden) deficiency    ??? Kidney calculi    ??? Thromboembolus      pulmonary emboli x 15   ??? Cholecystitis with cholelithiasis    ??? Chronic kidney disease      Renal stones   ??? Autoimmune disease      factor 5 defficiency   ??? Sleep disorder      problems sleeping   ??? Neurological disorder      hx migraines   ??? Infectious disease      MRSA- blood cultures   ??? Other ill-defined conditions      hx kidney stones          Past Surgical History   Procedure Date   ??? Cystoscopy    ??? Lithotripsy    ??? Hx vascular access      port to right chest s/p 5 years.   ??? Hx appendectomy    ??? Hx gyn      cysts from ovaries, c-section, tubal ligation   ??? Hx tubal ligation    ??? Hx tubal ligation    ??? Hx urological      for stones   ??? Hx heent    ??? Hx tonsillectomy     ??? Hx other surgical            No family history on file.     History   Social History   ??? Marital Status: Legally Separated     Spouse Name: N/A     Number of Children: N/A   ??? Years of Education: N/A   Occupational History   ??? Not on file.   Social History Main Topics   ??? Tobacco Use: Never   ??? Alcohol Use: No   ??? Drug Use: No   ??? Sexually Active: Yes -- Female partner(s)     Birth Control/ Protection: Surgical   Other Topics Concern   ??? Not on file   Social History Narrative   ??? No narrative on file           ALLERGIES: Codeine, Pcn, Prednisone, Ketorolac tromethamine, Shellfish, Iv dye, iodine containing, Influenza virus vaccine, Pneumovax 23, Morphine, Dilaudid, Demerol and Motrin      Review of Systems   Constitutional: Negative for fever, chills and diaphoresis.  HENT: Negative for neck pain and neck stiffness.    Respiratory: Positive for shortness of breath.    Cardiovascular: Positive for chest pain. Negative for palpitations.   Gastrointestinal: Negative for nausea, vomiting and abdominal pain.   Musculoskeletal: Positive for back pain.   Neurological: Negative for weakness and numbness.   All other systems reviewed and are negative.        Filed Vitals:    08/29/2008  3:40 PM   BP: 121/64   Pulse: 90   Resp: 16   Height: 5\' 4"  (1.626 m)   Weight: 161 lb (73.029 kg)              Physical Exam   Nursing note and vitals reviewed.  Constitutional: She is oriented to person, place, and time. She appears well-developed and well-nourished.   HENT:   Head: Normocephalic and atraumatic.   Nose: Nose normal.   Mouth/Throat: Oropharynx is clear and moist.   Eyes: Conjunctivae are normal. Pupils are equal, round, and reactive to light.   Neck: Normal range of motion. Neck supple.   Cardiovascular: Normal rate, regular rhythm and normal heart sounds.    Pulmonary/Chest: Effort normal and breath sounds normal. No respiratory distress. She has no wheezes. She has no rales.    Abdominal: Soft. Bowel sounds are normal. She exhibits no distension. No tenderness. She has no rebound and no guarding.   Musculoskeletal: Normal range of motion. She exhibits no edema.   Neurological: She is alert and oriented to person, place, and time. No cranial nerve deficit. Coordination normal.   Skin: Skin is warm and dry. No rash noted. No erythema.   Psychiatric: She has a normal mood and affect. Her behavior is normal.            MDM Coding   Reviewed: nursing note, vitals and previous chart  Reviewed previous: labs and x-ray  Interpretation: labs and x-ray          Procedures

## 2008-09-30 LAB — METABOLIC PANEL, COMPREHENSIVE
A-G Ratio: 1.1 — ABNORMAL LOW (ref 1.2–3.5)
ALT (SGPT): 30 U/L — ABNORMAL LOW (ref 39–65)
AST (SGOT): 17 U/L (ref 15–37)
Albumin: 4.1 g/dL (ref 3.5–5.0)
Alk. phosphatase: 141 U/L — ABNORMAL HIGH (ref 50–136)
Anion gap: 8 mmol/L (ref 7–16)
BUN: 11 MG/DL (ref 7–18)
Bilirubin, total: 0.3 MG/DL (ref 0.2–1.1)
CO2: 26 MMOL/L (ref 21–32)
Calcium: 9.3 MG/DL (ref 8.4–10.4)
Chloride: 105 MMOL/L (ref 98–107)
Creatinine: 0.9 MG/DL (ref 0.6–1.0)
GFR est AA: >60 mL/min/{1.73_m2} (ref >60)
GFR est non-AA: >60 mL/min/{1.73_m2} (ref >60)
Globulin: 3.6 g/dL — ABNORMAL HIGH (ref 2.3–3.5)
Glucose: 108 MG/DL — ABNORMAL HIGH (ref 74–106)
Potassium: 3.9 MMOL/L (ref 3.5–5.1)
Protein, total: 7.7 g/dL (ref 6.3–8.2)
Sodium: 139 MMOL/L (ref 136–145)

## 2008-09-30 LAB — CBC WITH AUTOMATED DIFF
ABS. BASOPHILS: 0 10*3/uL (ref 0.0–0.2)
ABS. EOSINOPHILS: 0.3 10*3/uL (ref 0.0–0.8)
ABS. IMM. GRANS.: 0 10*3/uL (ref 0.0–2.0)
ABS. LYMPHOCYTES: 1.6 10*3/uL (ref 0.5–4.6)
ABS. MONOCYTES: 0.4 10*3/uL (ref 0.1–1.3)
ABS. NEUTROPHILS: 1.5 10*3/uL — ABNORMAL LOW (ref 1.7–8.2)
BASOPHILS: 1 % (ref 0.0–2.0)
EOSINOPHILS: 8 % — ABNORMAL HIGH (ref 0.5–7.8)
HCT: 32.8 % — ABNORMAL LOW (ref 37.6–48.3)
HGB: 10.7 g/dL — ABNORMAL LOW (ref 11.7–15.0)
IMMATURE GRANULOCYTES: 0 % (ref 0.0–2.0)
LYMPHOCYTES: 42 % (ref 13–44)
MCH: 30 PG (ref 26.1–32.9)
MCHC: 32.6 g/dL (ref 31.4–35.0)
MCV: 91.9 FL (ref 79.6–97.8)
MONOCYTES: 9 % (ref 4.0–12.0)
MPV: 10.4 FL — ABNORMAL LOW (ref 10.8–14.1)
NEUTROPHILS: 40 % — ABNORMAL LOW (ref 43–78)
PLATELET: 230 10*3/uL (ref 140–440)
RBC: 3.57 M/uL — ABNORMAL LOW (ref 3.86–5.18)
RDW: 12.6 % (ref 11.9–14.6)
WBC: 3.8 10*3/uL — ABNORMAL LOW (ref 4.0–10.5)

## 2008-09-30 LAB — PROTHROMBIN TIME + INR
INR: 1 (ref 0.9–1.2)
Prothrombin time: 10.3 SECS (ref 9.4–10.8)

## 2008-09-30 LAB — LIPASE: Lipase: 96 U/L (ref 73–393)

## 2008-09-30 MED ADMIN — droperidol (INAPSINE) injection 1.25 mg: INTRAVENOUS | @ 17:00:00 | NDC 11098001002

## 2008-09-30 MED ADMIN — HYDROmorphone (PF) (DILAUDID) injection 2 mg: INTRAVENOUS | @ 16:00:00 | NDC 00409128331

## 2008-09-30 MED ADMIN — lorazepam (ATIVAN) tablet 1 mg: ORAL | @ 18:00:00 | NDC 51079038601

## 2008-09-30 MED ADMIN — ondansetron (ZOFRAN) injection 4 mg: INTRAVENOUS | @ 16:00:00 | NDC 00781301095

## 2008-09-30 MED FILL — INAPSINE 2.5 MG/ML INJECTION SOLUTION: 2.5 mg/mL | INTRAMUSCULAR | Qty: 2

## 2008-09-30 MED FILL — HYDROMORPHONE (PF) 1 MG/ML IJ SOLN: 1 mg/mL | INTRAMUSCULAR | Qty: 2

## 2008-09-30 MED FILL — ONDANSETRON (PF) 4 MG/2 ML INJECTION: 4 mg/2 mL | INTRAMUSCULAR | Qty: 2

## 2008-09-30 MED FILL — LORAZEPAM 1 MG TAB: 1 mg | ORAL | Qty: 1

## 2008-09-30 NOTE — ED Provider Notes (Signed)
HPI Comments: 43 y.o. 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 She has hx renal stones. PE Pt tearful. No tenderness.    Patient is a 43 y.o. female presenting with flank pain. The history is provided by the patient and medical records.   Flank Pain   This is a new problem. The current episode started yesterday. The problem has not changed since onset. The problem occurs constantly. Patient reports no work related injury.The pain is associated with no known injury. The quality of the pain is described as aching. The pain is at a severity of 8/10. The pain is severe. Associated symptoms include abdominal pain. Pertinent negatives include no chest pain, no fever, no headaches, no dysuria and no paresthesias. She has tried nothing for the symptoms. Risk factors include history of kidney stones.        Past Medical History   Diagnosis Date   ??? Pulmonary embolism    ??? Factor V (Leiden) deficiency    ??? Kidney calculi    ??? Thromboembolus      pulmonary emboli x 15   ??? Cholecystitis with cholelithiasis    ??? Chronic kidney disease      Renal stones   ??? Autoimmune disease      factor 5 defficiency   ??? Sleep disorder      problems sleeping   ??? Neurological disorder      hx migraines   ??? Infectious disease      MRSA- blood cultures   ??? Other ill-defined conditions      hx kidney stones          Past Surgical History   Procedure Date   ??? Cystoscopy    ??? Lithotripsy    ??? Hx vascular access      port to right chest s/p 5 years.   ??? Hx appendectomy    ??? Hx gyn      cysts from ovaries, c-section, tubal ligation   ??? Hx tubal ligation    ??? Hx tubal ligation    ??? Hx urological      for stones   ??? Hx heent    ??? Hx tonsillectomy    ??? Hx other surgical            No family history on file.     History   Social History   ??? Marital Status: Legally Separated     Spouse Name: N/A     Number of Children: N/A   ??? Years of Education: N/A   Occupational History   ??? Not on file.    Social History Main Topics   ??? Tobacco Use: Never   ??? Alcohol Use: No   ??? Drug Use: No   ??? Sexually Active: Yes -- Female partner(s)     Birth Control/ Protection: Surgical   Other Topics Concern   ??? Not on file   Social History Narrative   ??? No narrative on file           ALLERGIES: Codeine, Pcn, Prednisone, Ketorolac tromethamine, Shellfish, Iv dye, iodine containing, Influenza virus vaccine, Pneumovax 23, Morphine, Dilaudid, Demerol and Motrin      Review of Systems   Constitutional: Negative for fever.   HENT: Negative for congestion and neck pain.    Eyes: Negative for discharge.   Respiratory: Negative for cough and shortness of breath.    Cardiovascular: Negative for chest pain and leg swelling.  Gastrointestinal: Positive for abdominal pain. Negative for vomiting, diarrhea and abdominal distention.   Genitourinary: Positive for flank pain. Negative for dysuria.   Musculoskeletal: Negative for joint swelling.   Skin: Negative for rash.   Neurological: Negative for speech difficulty and headaches.   Psychiatric/Behavioral: The patient is nervous/anxious.    All other systems reviewed and are negative.        Filed Vitals:    09/30/2008 11:36 AM   BP: 129/102   Pulse: 97   Temp: 98.3 ??F (36.8 ??C)   Resp: 18   Height: 5\' 4"  (1.626 m)   Weight: 149 lb 4.8 oz (67.722 kg)   SpO2: 98%              Physical Exam   Nursing note and vitals reviewed.  Constitutional: She is oriented to person, place, and time. She appears well-developed and well-nourished.   HENT:   Head: Normocephalic.   Eyes: Conjunctivae and extraocular motions are normal. Pupils are equal, round, and reactive to light.   Neck: Normal range of motion. No thyromegaly present.   Cardiovascular: Normal rate.    Pulmonary/Chest: Effort normal and breath sounds normal. No respiratory distress.   Abdominal: Soft. She exhibits no distension. No tenderness.   Musculoskeletal: Normal range of motion. She exhibits no edema.   Lymphadenopathy:      She has no cervical adenopathy.   Neurological: She is alert and oriented to person, place, and time.   Skin: Skin is warm and dry. No rash noted.   Psychiatric: Her mood appears anxious.        MDM Coding   Reviewed: nursing note, vitals and previous chart  Interpretation: labs and CT scan        Procedures

## 2008-09-30 NOTE — ED Notes (Signed)
Pt stated she was itching s/p Inapsine given. Dr. Constance Goltz notified and verbal order for Ativan 1 mg PO received and given per orders.

## 2008-09-30 NOTE — ED Notes (Signed)
Lab called and stated we needed a re-draw on blue top. Attempted to re-draw from port with no success by myself and Britt Boozer, Charity fundraiser. Lab called and asked them to send someone over for the re-draw.

## 2008-09-30 NOTE — ED Notes (Signed)
Pt reports waking at 0300 today w/ likely renal stone pain, also has report of black tarry stools since yesterday.

## 2008-09-30 NOTE — ED Notes (Signed)
CT urogram - no ureteral stones

## 2008-09-30 NOTE — ED Notes (Signed)
I have reviewed discharge instructions with the patient.  The patient verbalized understanding.

## 2008-09-30 NOTE — ED Notes (Signed)
Stool heme neg.

## 2008-09-30 NOTE — ED Notes (Signed)
Lab attempted to draw blood x 2 with no success. Pt crying and writhing around in bed she states from increased pain. MD notified and meds given per order.

## 2008-10-03 LAB — CBC WITH AUTOMATED DIFF
ABS. BASOPHILS: 0 10*3/uL (ref 0.0–0.2)
ABS. EOSINOPHILS: 0.2 10*3/uL (ref 0.0–0.8)
ABS. IMM. GRANS.: 0 10*3/uL (ref 0.0–2.0)
ABS. LYMPHOCYTES: 1.4 10*3/uL (ref 0.5–4.6)
ABS. MONOCYTES: 0.4 10*3/uL (ref 0.1–1.3)
ABS. NEUTROPHILS: 3.1 10*3/uL (ref 1.7–8.2)
BASOPHILS: 0 % (ref 0.0–2.0)
EOSINOPHILS: 3 % (ref 0.5–7.8)
HCT: 29.7 % — ABNORMAL LOW (ref 37.6–48.3)
HGB: 9.6 g/dL — ABNORMAL LOW (ref 11.7–15.0)
LYMPHOCYTES: 28 % (ref 13–44)
MCH: 29.4 PG (ref 26.1–32.9)
MCHC: 32.3 g/dL (ref 31.4–35.0)
MCV: 91.1 FL (ref 79.6–97.8)
MONOCYTES: 8 % (ref 4.0–12.0)
MPV: 11 FL (ref 10.8–14.1)
NEUTROPHILS: 61 % (ref 43–78)
PLATELET: 200 10*3/uL (ref 140–440)
RBC: 3.26 M/uL — ABNORMAL LOW (ref 3.86–5.18)
RDW: 12.7 % (ref 11.9–14.6)
WBC: 5.1 10*3/uL (ref 4.0–10.5)

## 2008-10-03 LAB — METABOLIC PANEL, BASIC
Anion gap: 5 mmol/L — ABNORMAL LOW (ref 7–16)
BUN: 10 MG/DL (ref 7–18)
CO2: 26 MMOL/L (ref 21–32)
Calcium: 9.2 MG/DL (ref 8.4–10.4)
Chloride: 101 MMOL/L (ref 98–107)
Creatinine: 0.8 MG/DL (ref 0.6–1.0)
GFR est AA: >60 mL/min/{1.73_m2} (ref >60)
GFR est non-AA: >60 mL/min/{1.73_m2} (ref >60)
Glucose: 95 MG/DL (ref 74–106)
Potassium: 3.7 MMOL/L (ref 3.5–5.1)
Sodium: 132 MMOL/L — ABNORMAL LOW (ref 136–145)

## 2008-10-03 MED ADMIN — ondansetron (ZOFRAN) injection 4 mg: INTRAVENOUS | @ 23:00:00 | NDC 00143989105

## 2008-10-03 MED ADMIN — HYDROmorphone (PF) (DILAUDID) injection 1 mg: INTRAVENOUS | @ 23:00:00 | NDC 00409255201

## 2008-10-03 MED FILL — HYDROMORPHONE (PF) 1 MG/ML IJ SOLN: 1 mg/mL | INTRAMUSCULAR | Qty: 1

## 2008-10-03 MED FILL — ONDANSETRON (PF) 4 MG/2 ML INJECTION: 4 mg/2 mL | INTRAMUSCULAR | Qty: 2

## 2008-10-03 NOTE — ED Notes (Signed)
PT in Nuc med for lung scan.

## 2008-10-03 NOTE — ED Notes (Signed)
Patient asked as to tramadol use and states she has NO problem

## 2008-10-03 NOTE — ED Provider Notes (Signed)
HPI Comments: Pt stated she began with some pain and tightness in her right leg yesterday. This cont and today she began with some pleuritic chest pain she likened to her pain with PE. This had happened early this year. She is no longer taking coumadin or lovanox because of complications. She has not traveled recently    Patient is a 43 y.o. female presenting with leg pain and chest pain. The history is provided by the patient and medical records.   Leg Pain   This is a new problem. The current episode started yesterday. The problem occurs constantly. The problem has been resolved. The pain is present in the right lower leg. The quality of the pain is described as aching and dull. The pain is moderate. Pertinent negatives include no numbness and no limited range of motion. There has been no history of extremity trauma.   Chest Pain   This is a new problem. The current episode started 12 to 24 hours ago. The problem has not changed since onset. The problem occurs constantly. The pain is associated with breathing and normal activity. The pain is present in the right side. The pain is moderate. The quality of the pain is described as pleuritic. The pain does not radiate. Associated symptoms include leg pain. Pertinent negatives include no diaphoresis, no numbness, no nausea, no vomiting and no shortness of breath. She has tried nothing for the symptoms.        Past Medical History   Diagnosis Date   ??? Pulmonary embolism    ??? Factor V (Leiden) deficiency    ??? Kidney calculi    ??? Thromboembolus      pulmonary emboli x 15   ??? Cholecystitis with cholelithiasis    ??? Chronic kidney disease      Renal stones   ??? Autoimmune disease      factor 5 defficiency   ??? Sleep disorder      problems sleeping   ??? Neurological disorder      hx migraines   ??? Infectious disease      MRSA- blood cultures   ??? Other ill-defined conditions      hx kidney stones          Past Surgical History   Procedure Date   ??? Cystoscopy    ??? Lithotripsy     ??? Hx vascular access      port to right chest s/p 5 years.   ??? Hx appendectomy    ??? Hx gyn      cysts from ovaries, c-section, tubal ligation   ??? Hx tubal ligation    ??? Hx tubal ligation    ??? Hx urological      for stones   ??? Hx heent    ??? Hx tonsillectomy    ??? Hx other surgical            No family history on file.     History   Social History   ??? Marital Status: Legally Separated     Spouse Name: N/A     Number of Children: N/A   ??? Years of Education: N/A   Occupational History   ??? Not on file.   Social History Main Topics   ??? Tobacco Use: Never   ??? Alcohol Use: No   ??? Drug Use: No   ??? Sexually Active: Yes -- Female partner(s)     Birth Control/ Protection: Surgical   Other Topics Concern   ??? Not  on file   Social History Narrative   ??? No narrative on file           ALLERGIES: Codeine, Pcn, Prednisone, Ketorolac tromethamine, Shellfish, Iv dye, iodine containing, Influenza virus vaccine, Pneumovax 23, Morphine, Dilaudid, Demerol and Motrin      Review of Systems   Constitutional: Positive for activity change. Negative for diaphoresis.   Respiratory: Negative for shortness of breath.    Cardiovascular: Positive for chest pain.   Gastrointestinal: Negative for nausea and vomiting.   Neurological: Negative for numbness.   All other systems reviewed and are negative.        Filed Vitals:    10/03/2008  4:06 PM   BP: 135/86   Pulse: 100   Temp: 98.4 ??F (36.9 ??C)   Resp: 18   Height: 5\' 4"  (1.626 m)   Weight: 149 lb (67.586 kg)   SpO2: 100%              Physical Exam   Nursing note and vitals reviewed.  Constitutional: She is oriented to person, place, and time. She appears well-developed and well-nourished. She appears distressed.   HENT:   Head: Normocephalic and atraumatic.   Right Ear: External ear normal.   Left Ear: External ear normal.   Eyes: Extraocular motions are normal. Pupils are equal, round, and reactive to light.   Neck: Normal range of motion. Neck supple.    Cardiovascular: Normal rate and regular rhythm.    Pulmonary/Chest: Effort normal and breath sounds normal.   Abdominal: Soft. Bowel sounds are normal.   Musculoskeletal: Normal range of motion. She exhibits no edema and no tenderness.   Lymphadenopathy:     She has no cervical adenopathy.   Neurological: She is alert and oriented to person, place, and time.   Skin: Skin is warm and dry.   Psychiatric: She has a normal mood and affect.        Coding    Procedures

## 2008-10-03 NOTE — ED Notes (Signed)
Attempted to draw labs from pt.  Refused. MD aware.

## 2008-10-03 NOTE — ED Notes (Signed)
Report of V-Q is high probability on read from radiology, have consulted Dr. Lucienne Capers for opinion

## 2008-10-04 ENCOUNTER — Inpatient Hospital Stay
Admit: 2008-10-04 | Discharge: 2008-10-07 | Disposition: A | Discharge status: Home / Self Care | Source: Home / Self Care | Attending: Pulmonary Disease | Admitting: Pulmonary Disease

## 2008-10-04 DIAGNOSIS — R0781 Pleurodynia: Secondary | ICD-10-CM

## 2008-10-04 LAB — URINALYSIS W/O MICRO
Bilirubin: NEGATIVE
Glucose: NEGATIVE MG/DL
Ketone: NEGATIVE MG/DL
Leukocyte Esterase: NEGATIVE
Nitrites: NEGATIVE
Protein: NEGATIVE MG/DL
Specific gravity: 1.01 (ref 1.001–1.023)
Urobilinogen: 0.2 EU/DL (ref 0.2–1.0)
pH (UA): 6.5 (ref 5.0–9.0)

## 2008-10-04 LAB — URINE MICROSCOPIC
Bacteria: 0 /HPF
Casts: 0 /LPF
Crystals, urine: 0 /LPF
Mucus: 0 /LPF
WBC: 0 /HPF

## 2008-10-04 LAB — TRANSFERRIN SATURATION
Iron: 37 ug/dL (ref 35–150)
TIBC: 481 ug/dL — ABNORMAL HIGH (ref 250–450)
Transferrin Saturation: 8 % — ABNORMAL LOW (ref >20)

## 2008-10-04 LAB — VITAMIN B12: Vitamin B12: 263 pg/mL (ref >200)

## 2008-10-04 LAB — D DIMER: D DIMER: 0.29 ug/ml(FEU) (ref <0.55)

## 2008-10-04 LAB — MRSA SCREEN - PCR (NASAL)

## 2008-10-04 LAB — D-DIMER, QUANTITATIVE: D-Dimer, Quant: 0.29 ug/ml(FEU) (ref <0.55)

## 2008-10-04 MED ADMIN — tramadol (ULTRAM) tablet 50 mg: ORAL | @ 01:00:00 | NDC 51079099101

## 2008-10-04 MED ADMIN — SALINE PERIPHERAL FLUSH Q8H 5 mL: @ 20:00:00 | NDC 87701099893

## 2008-10-04 MED ADMIN — magnesium citrate solution 296 mL: ORAL | @ 21:00:00

## 2008-10-04 MED ADMIN — fondaparinux (ARIXTRA) injection 7.5 mg: SUBCUTANEOUS | @ 14:00:00 | NDC 00007323401

## 2008-10-04 MED ADMIN — mupirocin (BACTROBAN) 2 % ointment: NASAL | @ 15:00:00 | NDC 00168035222

## 2008-10-04 MED ADMIN — nuclear medicine isotope: @ 01:00:00 | NDC 88888888806

## 2008-10-04 MED ADMIN — iron dextran complex (INFED) 25 mg in 0.9% sodium chloride 50 mL ivpb: INTRAVENOUS | @ 18:00:00 | NDC 52544093102

## 2008-10-04 MED ADMIN — zolpidem (AMBIEN) tablet 10 mg: ORAL | @ 06:00:00 | NDC 68084022611

## 2008-10-04 MED ADMIN — duloxetine (CYMBALTA) capsule 60 mg: ORAL | @ 14:00:00 | NDC 00002327001

## 2008-10-04 MED ADMIN — oxycodone-acetaminophen (PERCOCET 10) 10-325 mg per tablet 1 Tab: ORAL | @ 16:00:00 | NDC 68084037811

## 2008-10-04 MED ADMIN — oxycodone-acetaminophen (PERCOCET 10) 10-325 mg per tablet 1 Tab: ORAL | @ 11:00:00 | NDC 68084037811

## 2008-10-04 MED ADMIN — clonazepam (KLONOPIN) tablet 0.5 mg: ORAL | @ 20:00:00 | NDC 00093083319

## 2008-10-04 MED ADMIN — oxycodone-acetaminophen (PERCOCET 10) 10-325 mg per tablet 1 Tab: ORAL | @ 20:00:00 | NDC 68084037811

## 2008-10-04 MED ADMIN — methylPREDNISolone (SOLU-MEDROL) injection 40 mg: INTRAVENOUS | @ 18:00:00 | NDC 00409568423

## 2008-10-04 MED ADMIN — clonazepam (KLONOPIN) tablet 0.5 mg: ORAL | @ 14:00:00 | NDC 00093083319

## 2008-10-04 MED ADMIN — iron dextran complex (INFED) 1,000 mg in 0.9% sodium chloride 500 mL ivpb: INTRAVENOUS | @ 20:00:00 | NDC 52544093102

## 2008-10-04 MED ADMIN — tramadol (ULTRAM) tablet 50 mg: ORAL | @ 15:00:00 | NDC 51079099101

## 2008-10-04 MED ADMIN — oxycodone-acetaminophen (PERCOCET 10) 10-325 mg per tablet 1 Tab: ORAL | @ 04:00:00 | NDC 00406052362

## 2008-10-04 MED ADMIN — SALINE PERIPHERAL FLUSH Q8H 5 mL: @ 02:00:00

## 2008-10-04 MED ADMIN — tramadol (ULTRAM) tablet 50 mg: ORAL | @ 22:00:00 | NDC 51079099101

## 2008-10-04 MED ADMIN — methylPREDNISolone (SOLU-MEDROL) injection 40 mg: INTRAVENOUS | @ 22:00:00 | NDC 00409568423

## 2008-10-04 MED ADMIN — mupirocin (BACTROBAN) 2 % ointment: NASAL | @ 22:00:00 | NDC 45802011222

## 2008-10-04 MED FILL — MUPIROCIN 2 % OINTMENT: 2 % | CUTANEOUS | Qty: 22

## 2008-10-04 MED FILL — SOLU-MEDROL 40 MG/ML SOLUTION FOR INJECTION: 40 mg/mL | INTRAMUSCULAR | Qty: 1

## 2008-10-04 MED FILL — OXYCODONE-ACETAMINOPHEN 10 MG-325 MG TAB: 10-325 mg | ORAL | Qty: 1

## 2008-10-04 MED FILL — ZOLPIDEM 10 MG TAB: 10 mg | ORAL | Qty: 1

## 2008-10-04 MED FILL — MAGNESIUM CITRATE ORAL SOLN: ORAL | Qty: 300

## 2008-10-04 MED FILL — TRAMADOL 50 MG TAB: 50 mg | ORAL | Qty: 1

## 2008-10-04 MED FILL — ARIXTRA 7.5 MG/0.6 ML SUBCUTANEOUS SOLUTION SYRINGE: 7.5 mg/0.6 mL | SUBCUTANEOUS | Qty: 0.6

## 2008-10-04 MED FILL — CLONAZEPAM 1 MG TAB: 1 mg | ORAL | Qty: 1

## 2008-10-04 MED FILL — CYMBALTA 60 MG CAPSULE,DELAYED RELEASE: 60 mg | ORAL | Qty: 1

## 2008-10-04 MED FILL — INFED 50 MG/ML INJECTION SOLUTION: 50 mg/mL | INTRAMUSCULAR | Qty: 20

## 2008-10-04 MED FILL — INFED 50 MG/ML INJECTION SOLUTION: 50 mg/mL | INTRAMUSCULAR | Qty: 0.5

## 2008-10-04 NOTE — Progress Notes (Signed)
TRANSFER - IN REPORT:    Verbal report received from Sanjuana Letters, RN on Krista Lopez  being received from ER(unit) for routine progression of care      Report consisted of patient???s Situation, Background, Assessment and   Recommendations(SBAR).     Information from the following report(s) ED Summary was reveiwed with the receiving nurse.    Opportunity for questions and clarification was provided.      Assessment completed upon patient???s arrival to unit and care assumed.

## 2008-10-04 NOTE — ED Notes (Signed)
TRANSFER - OUT REPORT:    Verbal report given to [facility] on LATRENDA IRANI  being transferred to 815 for routine progression of care       Report consisted of patient???s Situation, Background, Assessment and   Recommendations(SBAR).     Information from the following report(s) SBAR, Kardex, ED Summary, [facility] and Recent Results was reviewed with the receiving nurse.    Opportunity for questions and clarification was provided.

## 2008-10-04 NOTE — Progress Notes (Addendum)
Admission assessment and database completed, see flowheets. VSS. No distress. Pt with history of MRSA. MRSA nasal swab collected and sent to the lab. Pt placed on contact isolation pending results.

## 2008-10-04 NOTE — Progress Notes (Signed)
Complains of severe pain percocet 10 mg . Pt refuses. States "Dr. Lenise Arena said if ultram doesn't work I can have a pain shot". Dr Nelle Don notified. Order received and read back for tordol 30 mg IV every 6 hr as needed.

## 2008-10-04 NOTE — Consults (Signed)
[facility] Consult Note      Referring Physician:  Thayer Ohm MD    Consult Date:  10/04/2008    Admit Date:  10/03/2008    Chief Complaint:  Anemia    Subjective:     History of Present Illness:  Patient is a 43 y.o. Caucasian female  who is seen in consultation at the request of Dr. Everlene Other for evaluation of anemia.  She reports a life-long history of anemia with evaluation previously secondary to use of anti-coagulation in relation to diagnosis of Factor V Leiden deficiency.  She has been on Arixtra injections as an outpatient.  She denies history of recurrent indigestion or heartburn and has had no abdominal pain.  Her bowel habits fluctuate but tend more towards constipation.  She denies rectal bleeding or melena.  Her weight has been stable with no unexplained loss.      History is significant for previous gi evaluation in 2008 at [facility].  EGD performed 05/21/06 with Dr Varney Baas revealed mild antral gastritis but no source of bleeding was identified.  Colonoscopy, 05/25/06, with Dr Rudene Anda, revealed a sub-optimal prep with internal hemorrhoids but colon was otherwise normal.    PMH:  Past Medical History   Diagnosis Date   ??? Pulmonary embolism Multiple episodes     The patient has underlying Factor V (Leiden) deficiency.   ??? Nephrolithiasis Multiple episodes     She has had multiple kidney stones that have passed sponatnaeously and that have required lithotripsy on at least one occasion         PSH:  Past Surgical History   Procedure Date   ??? Cystoscopy ???   ??? Lithotripsy 2006   ??? Hx vascular access July, 2010     Power Port placed in [place], [place].   ??? Hx appendectomy ???   ??? Hx cesarean section ???   ??? Hx tubal ligation ???   ??? Hx tonsillectomy ???         Allergies:  Allergies   Allergen Reactions   ??? Codeine Rash   ??? Pcn (Penicillins) Hives   ??? Prednisone Rash   ??? Ketorolac Tromethamine Rash   ??? Shellfish Rash   ??? Iv Dye, Iodine Containing Rash    ??? Influenza Virus Vaccine Swelling     PT states she can take yearly flu shot   ??? Pneumovax 23 (Pneumococcal 23-valps Vaccine) Other (comments)     Local reaction   ??? Morphine Other (comments)   ??? Dilaudid (Hydromorphone (Bulk)) Other (comments)        ??? Demerol (Meperidine) Other (comments)        ??? Motrin (Ibuprofen) Other (comments)     Causes stomach upset after 3 doses.         Home Medications:  Prior to Admission medications    Medication Sig Start Date End Date Taking? Authorizing Provider   fondaparinux (ARIXTRA) 7.5 mg/0.6 mL Syrg injection 7.5 mg by SubCUTAneous route daily.   Yes Phys Other, MD   zolpidem (AMBIEN) 10 mg tablet Take  by mouth nightly. 07/16/08  Yes Phys Other, MD   KLONOPIN 1 mg Tab Take 0.5 mg by mouth three (3) times daily.   Yes Phys Other, MD   CYMBALTA 60 mg capsule take 60 mg by mouth daily.    Yes Phys Other, MD         Hospital Medications:  Current facility-administered medications   Medication Dose Route Frequency   ??? duloxetine (  CYMBALTA) capsule 60 mg  60 mg Oral DAILY   ??? fondaparinux (ARIXTRA) injection 7.5 mg  7.5 mg SubCUTAneous DAILY   ??? clonazepam (KLONOPIN) tablet 0.5 mg  0.5 mg Oral TID   ??? oxycodone-acetaminophen (PERCOCET 10) 10-325 mg per tablet 1 Tab  1 Tab Oral Q4H PRN   ??? mupirocin (BACTROBAN) 2 % ointment    Nasal BID   ??? tramadol (ULTRAM) tablet 50 mg  50 mg Oral Q6H PRN   ??? famotidine (PEPCID) tablet 40 mg  40 mg Oral ON CALL   ??? diphenhydrAMINE (BENADRYL) capsule 100 mg  100 mg Oral QHS   ??? diphenhydrAMINE (BENADRYL) capsule 50 mg  50 mg Oral ON CALL   ??? methylPREDNISolone (SOLU-MEDROL) injection 40 mg  40 mg IntraVENous Q6H   ??? iron dextran complex (INFED) 25 mg in 0.9% sodium chloride 50 mL ivpb  25 mg IntraVENous ONCE   ??? iron dextran complex (INFED) 1,000 mg in 0.9% sodium chloride 500 mL ivpb  1,000 mg IntraVENous DAILY   ??? DISCONTD: zolpidem (AMBIEN) tablet 10 mg  10 mg Oral QHS   ??? DISCONTD: acetaminophen (TYLENOL) solution 650 mg  650 mg Oral Q4H PRN    ??? DISCONTD: predniSONE (DELTASONE) tablet 20 mg  20 mg Oral Q6H   ??? SALINE PERIPHERAL FLUSH Q8H 5 mL  5 mL InterCATHeter Q8H   ??? saline peripheral flush 5 mL  5 mL InterCATHeter PRN   ??? HYDROmorphone (PF) (DILAUDID) injection 1 mg  1 mg IntraVENous NOW   ??? ondansetron (ZOFRAN) injection 4 mg  4 mg IntraVENous NOW   ??? tramadol (ULTRAM) tablet 50 mg  50 mg Oral NOW   ??? nuclear medicine isotope    Does Not Apply ONCE   ??? nuclear medicine isotope    Does Not Apply ONCE         Social History:  History   Substance Use Topics   ??? Tobacco Use: Never   ??? Alcohol Use: No       Family History:  No family history on file.    Review of Systems:  A detailed 10 system ROS is obtained, with pertinent positives as listed above.  All others are negative.    Diet:  regular    Objective:     Physical Exam:  Vitals:  BP 101/61   Pulse 70   Temp 98.4 ??F (36.9 ??C)   Resp 18   Ht 5\' 4"  (1.626 m)   Wt 149 lb (67.586 kg)   SpO2 93%   LMP 07/28/2004  Gen:  Pt is alert, cooperative, no acute distress  Skin:  Extremities and face reveal no rashes.   HEENT: Sclerae anicteric.  Extra-occular muscles are intact.  No oral ulcers.  No abnormal pigmentation of the lips.  The neck is supple.  Cardiovascular: Regular rate and rhythm. No murmurs, gallops, or rubs.  Respiratory:  Comfortable breathing with no accessory muscle use. Clear breath sounds anteriorly with no wheezes, rales, or rhonchi.  GI:  Abdomen nondistended, soft, and nontender.  Normal active bowel sounds. No enlargement of the liver or spleen. No masses palpable.  Rectal:  Deferred.  Musculoskeletal:  Port intact to right chest wall.  No pitting edema of the lower legs.  Extremities have good range of motion.  No costovertebral tenderness.  Neurological:  Gross memory appears intact.  Patient is alert and oriented.  Psychiatric:  Mood appears appropriate with judgement intact.  Lymphatic:  No cervical or supraclavicular adenopathy.  Laboratory:    Recent Labs    [facility] 10/03/08 1912   ??? WBC 5.1   ??? HGB 9.6*   ??? HCT 29.7*   ??? PLT 200   ??? MCV 91.1   ??? NA 132*   ??? K 3.7   ??? CL 101   ??? CO2 26   ??? BUN 10   ??? CREA 0.8   ??? CA 9.2   ??? GLU 95   ??? AP --   ??? SGOT --   ??? GPT --   ??? TBIL --   ??? CBIL --   ??? AML --   ??? LPSE --   ??? PTP --   ??? INR --   ??? 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  May need small bowel capsule evaluation at some point.  Will begin prep tonight and plan procedures for Friday.    Patient is seen and examined in collaboration with Dr. Patty Sermons.  Assessment and plan as per Dr. Patty Sermons.

## 2008-10-04 NOTE — Progress Notes (Signed)
Instructed patient that she needed 20g IV site for her CT chest on tomorrow, patient ingormed me that she has a power port. Patient showed me a card stating that she has a power lifeport. Called CT and spoke with Olegario Messier and told Olegario Messier that patient has a power port. Olegario Messier stated that the power life port could be used for CT chest

## 2008-10-04 NOTE — Consults (Signed)
I have seen and examined this patient, and agree with above assessment and plan.    Plan EGD [facility] Fri, 2 d prep    Donata Duff, MD

## 2008-10-04 NOTE — Progress Notes (Signed)
[place] Pt. Last Name: Toda  [facility] Pt. First Name: [address] MR#: [id number] / Admit#: [id number]   [place], [place] [address] DOB: 26-Jul-1965 / Age: 43  Attn.: Sharmon Leyden  Location: [phone]        Case Management - Progress Note  Initial Open Date: 10/04/2008   Case Manager: Elmarie Shiley, RN    Initial Open Date: 10/04/2008  Social Worker: Overton Mam SW    Expected Date of Discharge:   Transferred From: Home  ECF Bed Held Until:   Bed Held By:     Power of Attorney:   POA/Guardian/Conservator Capacity:   Primary Caregiver:   Living Arrangements: Own Home    Source of Income:   Payee:   Psychosocial History:   Cultural/Religious/Language Issues:   Education Level:   ADLS/Current Living Arrangements Issues: lvies w/ boyfriend-disabled multiple   hospitalizations/ER hx of drug seeking behavior/noncompliace daily self   injected Arixtra    Past Providers: [facility]09    Will patient perform self care at discharge? Y    Anticipated Discharge Disposition Goal: Return to admission address    Assessment/Plan:   10/04/2008 04:44P Patient is a 43 yr old female admitted with pleuritic CP.   She lives with her boyfriend and has a hx of multiple hospitalizations/ER   visits. She has Medicare and Medicaid coverage which should provide proper   prescription benefits. H/P states she is self injecting arixtra at home. SW   to follow to assist with d/c plans when appropriate and encourage follow-up.    Overton Mam      Resources at Discharge:           Service Providers at Discharge:

## 2008-10-04 NOTE — Progress Notes (Signed)
Percocet 10 mg po given c/o pain 8/10 at chest with breathing

## 2008-10-04 NOTE — Progress Notes (Addendum)
DAILY PROGRESS NOTE:    Today's Date: 10/04/2008  Date of Admission: 10/03/2008    The patient's chart has been reviewed and the patient has been discussed with nursing staff.  Subjective:   Requesting dilaudid or demerol  No distress  Watching TV in the dark but now crying  RA    Review of Systems  A comprehensive review of systems was negative except for that written in the HPI.    MEDS SCHEDULED: Current facility-administered medications   Medication Dose Route Frequency   ??? duloxetine (CYMBALTA) capsule 60 mg  60 mg Oral DAILY   ??? fondaparinux (ARIXTRA) injection 7.5 mg  7.5 mg SubCUTAneous DAILY   ??? clonazepam (KLONOPIN) tablet 0.5 mg  0.5 mg Oral TID   ??? zolpidem (AMBIEN) tablet 10 mg  10 mg Oral QHS   ??? acetaminophen (TYLENOL) solution 650 mg  650 mg Oral Q4H PRN   ??? oxycodone-acetaminophen (PERCOCET 10) 10-325 mg per tablet 1 Tab  1 Tab Oral Q4H PRN   ??? mupirocin (BACTROBAN) 2 % ointment    Nasal BID   ??? SALINE PERIPHERAL FLUSH Q8H 5 mL  5 mL InterCATHeter Q8H   ??? saline peripheral flush 5 mL  5 mL InterCATHeter PRN   ??? HYDROmorphone (PF) (DILAUDID) injection 1 mg  1 mg IntraVENous NOW   ??? ondansetron (ZOFRAN) injection 4 mg  4 mg IntraVENous NOW   ??? tramadol (ULTRAM) tablet 50 mg  50 mg Oral NOW   ??? nuclear medicine isotope    Does Not Apply ONCE   ??? nuclear medicine isotope    Does Not Apply ONCE           Objective:   Filed Vitals:    10/03/2008  9:57 PM 10/04/2008 12:19 AM 10/04/2008  1:32 AM 10/04/2008  8:20 AM   BP: 104/53 108/53 142/80 105/65   Pulse:   86 115   Temp:   97.5 ??F (36.4 ??C)    Resp:   20    SpO2:   94%                  Physical Exam  Gen- the patient is well developed and in no distress. Crying and asking for dilaudid.  HEENT- PERRL, EOMI, no scleral icterus       oral muscosa moist without cyanosis.  Neck- the neck is supple; there is no JVD.   Lungs- clear bilaterally  Heart- RRR. There is no murmur.  Abd- soft and non-tender, unremarkable bowel sounds. No pain   Ext- warm without cyanosis. There is no edema.  Skin- no jaundice or rashes.  Neuro- alert and oriented x 3. No gross sensorimotor deficits are present.    Right port    Nutrition: regular  Activity:  As tolerated      LAB  Recent Labs   [facility] 10/03/08 1912   ??? WBC 5.1   ??? HGB 9.6*   ??? HCT 29.7*   ??? PLT 200       Recent Labs   [facility] 10/03/08 1912   ??? NA 132*   ??? K 3.7   ??? CL 101   ??? GLU 95   ??? CO2 26   ??? BUN 10   ??? CREA 0.8         Assessment:     Hospital Problems Date Reviewed: 10/04/2008      Class Noted    Pleuritic chest pain [786.52D]  10/04/2008    Right.  Factor V deficiency [286.3AY] (Chronic)  10/04/2008        Patient noncompliance [V15.81K] (Chronic)  10/04/2008        Anemia [285.9AA]  10/04/2008    Will get hematology to see.    Pulmonary embolism, Recurrent [415.19AD] (Chronic)  10/03/2008    Has been on arixtra Q 24 hours.             Plan:  Hematology to see  Ambulate  U/A  Requesting demerol and dilaudid/ no morphine or motrin per her  GI/DVT Prophylaxis:     Amber C. Luanne Bras, NP  I have spoken with and examined the patient. I have reviewed the history, examination, assessment, and plan and agree with the above note.  Will prep for contrast allergy and do CT tomorrow.

## 2008-10-04 NOTE — Progress Notes (Signed)
Resting in bed, no s/ sx pain, respirations even and unlabored, to continue to monitor.

## 2008-10-04 NOTE — Progress Notes (Signed)
C/o continued pain at 8, [employer] PA notified and [employer] PA stated that she would order something for the patient, Will continue to monitor.

## 2008-10-04 NOTE — Progress Notes (Signed)
Problem: Interdisciplinary Rounds  Goal: Interdisciplinary Rounds  Interdisciplinary team rounds were held 10/04/2008 with the following team members:Care Management, Nursing, Pharmacy and Respiratory Therapy and the patient.  Pt being examined per Dr Criselda Peaches during rounds.  Per chart review, pt lives with boyfriend and is known to ER and [facility] for multiple admissions. No needs noted at present time.  Plan of care discussed. See clinical pathway and/or care plan for interventions and desired outcomes.

## 2008-10-04 NOTE — Progress Notes (Signed)
Resting in bed, watching tv, no grimacing, to  Call with needs

## 2008-10-04 NOTE — Progress Notes (Signed)
Percocet 10 mg po given c/o pain 7/10

## 2008-10-04 NOTE — Progress Notes (Signed)
Ultram 50 mg po given c/o continued pain

## 2008-10-04 NOTE — H&P (Signed)
LATE ENTRY- PATIENT SEEN IN ED 10/03/08      HISTORY AND PHYSICAL    Krista Lopez      Date of Admission:  10/03/2008      Subjective:     Patient is a 43 y.o. Caucasian female admitted to the floor from the ED with right pleuritic chest pain of unclear etiology.  This lady is very well-known to this practice having been officially "terminated" on at least two occasions because of noncompliance.  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  More recently she has allegedly become "allergic" to radiographic dyes making documentation of true recurrent emboli difficult without the ability to do a PE protocol CT scan of the chest.  Unfortunately, ventilation perfusion lung scans have become the predominant radiographic method of evaluating her.  Most recently, she has had unchanged scans here in both 2008 as well as in April of 2009.  In addition to her noncompliance, she frequently "doctor shops" having most recently being hospitalized in [place], [place].  While there, she had a port placed in her right anterior chest because of venous access difficulty.  Additionally, she was started on daily subcutaneous Arixtra.    She was seen in the emergency department here 3 nights ago complaining of chest pain.  However, she was discharged home at that time.  She returned this evening complaining of severe right-sided posterior pain that was pleuritic in nature.  As discussed below, she is not felt to have had a pulmonary embolus at this time.  However, because of the severe degree of her pain, as well as questions about the advisability of continuing Arixtra indefinitely, she is admitted for further evaluation.      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 Pulmonary embolism Multiple episodes     The patient has underlying Factor V (Leiden) deficiency.   ??? Nephrolithiasis Multiple episodes     She has had multiple kidney stones that have passed sponatnaeously and that have required lithotripsy on at least one occasion        Past Surgical History   Procedure Date   ??? Cystoscopy ???   ??? Lithotripsy 2006   ??? Hx vascular access July, 2010     Power Port placed in [place], [place].   ??? Hx appendectomy ???   ??? Hx cesarean section ???   ??? Hx tubal ligation ???   ??? Hx tonsillectomy ???        Allergies   Allergen Reactions   ??? Codeine Rash   ??? Pcn (Penicillins) Hives   ??? Prednisone Rash   ??? Ketorolac Tromethamine Rash   ??? Shellfish Rash   ??? Iv Dye, Iodine Containing Rash   ??? Influenza Virus Vaccine Swelling   ??? Pneumovax 23 (Pneumococcal 23-valps Vaccine) Other (comments)     Local reaction   ??? Morphine Other (comments)   ??? Dilaudid (Hydromorphone (Bulk)) Other (comments)     PT CAN TAKE   ??? Demerol (  Meperidine) Other (comments)     PT CAN TAKE     ??? Motrin (Ibuprofen) Other (comments)     Causes stomach upset after 3 doses.        History   Substance Use Topics   ??? Tobacco Use: Never   ??? Alcohol Use: No        Family History:  Family history is noncontributory to the patient's current problems and complaints.        Current facility-administered medications   Medication Dose Route Frequency   ??? oxycodone-acetaminophen (PERCOCET 10) 10-325 mg per tablet 1 Tab  1 Tab Oral Q4H PRN   ??? SALINE PERIPHERAL FLUSH Q8H 5 mL  5 mL InterCATHeter Q8H   ??? saline peripheral flush 5 mL  5 mL InterCATHeter PRN   ??? HYDROmorphone (PF) (DILAUDID) injection 1 mg  1 mg IntraVENous NOW   ??? ondansetron (ZOFRAN) injection 4 mg  4 mg IntraVENous NOW    ??? tramadol (ULTRAM) tablet 50 mg  50 mg Oral NOW   ??? nuclear medicine isotope    Does Not Apply ONCE   ??? nuclear medicine isotope    Does Not Apply   ONCE   Current outpatient prescriptions   Medication Sig   ??? fondaparinux (ARIXTRA) 7.5 mg/0.6 mL Syrg injection 7.5 mg by SubCUTAneous route daily.   ??? zolpidem (AMBIEN) 10 mg tablet Take  by mouth nightly.   ??? KLONOPIN 1 mg Tab Take 0.5 mg by mouth three (3) times daily.   ??? CYMBALTA 60 mg capsule take 60 mg by mouth daily.        Review of Systems  A comprehensive review of systems was negative except for that written in the HPI.      Objective:     Filed Vitals:    10/03/2008  7:18 PM 10/03/2008  7:44 PM 10/03/2008  9:57 PM 10/04/2008 12:19 AM   BP:   104/53 108/53   Pulse: 90 92     Temp:       Resp: 14 24     Height:       Weight:       SpO2: 92% 94% on RA         PHYSICAL EXAM     Gen- the patient was actively crying when seen by me.  However, her nurse reported that she had been fine 15 minutes before.  HEENT- PERRL, EOMI, no scleral icterus       nose without alar flaring or epistaxis                  oral muscosa moist without cyanosis  Neck- no JVD or retractions  Lungs- clear breath sound bilaterally.  She has a port that has been accessed in the right anterior chest.  Heart- RRR without M,G,R  Abd- soft and non-tender; with positive bowel sounds.  Ext- warm without cyanosis. There is no lower leg edema.  Skin- no jaundice or rashes  Neuro- alert and oriented x 3. No gross sensorimotor deficits are present.    Portable CXR:  Unremarkable.    V/Q lung scan:  Current scan shows some mild abnormalities is is identical to studies done here in both 2008 and 2009.      Recent Labs   [facility] 10/03/08 1912   ??? WBC 5.1   ??? HGB 9.6*   ??? HCT 29.7*   ??? PLT 200       Recent Labs   [facility] 10/03/08  1912   ??? NA 132*   ??? K 3.7   ??? CL 101   ??? GLU 95   ??? CO2 26   ??? BUN 10   ??? CREA 0.8        Ref. Range 10/03/2008 19:12   D DIMER Latest Range: <0.55 ug/ml(FEU) 0.29        Assessment:     Hospital Problems Date Reviewed: 10/04/2008      Class Noted      Pleuritic chest pain [786.52D]  10/04/2008    Etiology And even validity of her complaints of chest pain are unclear.  She has exhibited drug seeking but a beer in the past.  Clearly this pain is not related to an acute pulmonary embolus, however.      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  I am unclear as to the advisability of this is a long-term therapy.  We may need to have her apply a weighted from a hematological standpoint.      Patient noncompliance [V15.81K] (Chronic)  10/04/2008        Anemia [285.9AA]  10/04/2008    She Is currently exhibiting a significant normochromic normocytic anemia.  This will require evaluation.      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  Additionally, her totally normal D-dimer makes an acute pulmonary embolus extremely unlikely.       Plan:     The patient is admitted to the floor for management of the problems discussed above.

## 2008-10-04 NOTE — Progress Notes (Signed)
Ultram 50 mg po given c/o pain 8/10

## 2008-10-04 NOTE — Consults (Signed)
[facility]                            [address]                           [place], [place]. [address]                                [phone]                                  CONSULTATION    NAME:  Krista Lopez, Krista Lopez                         MR:  [id number]  LOC:  [id number]  [id number]             SEX:  F               ACCT:  [id number]  DOB:  [birth year]            AGE:  43              PT:  I  ADMIT: 10/04/2008           DSCH:                 MSV: MED      This is a 20 year Caucasian female admitted under pulmonary service by  Dr. Ernestine Mcmurray with right pleuritic chest pain of unclear etiology.    Apparently she has been noncompliant in the past. She has had a history  of multiple pulmonary emboli that have been documented. Recently had a  history of allergy to radiographic dye, making documentation of recurrent  emboli difficult with PE protocol by CT scan. She has recently been  having V/Q scans which have been unchanged in 2008 and April 2009. She  has frequently seen a number of doctors, including a physician in [place] and had a  port placed for access. Was seen in the emergency room three nights ago  complaining of chest pain but discharged home at that time, only to  return with further pleuritic pain.    PAST SURGICAL HISTORY:  Includes:  1. Cystoscopy with lithotripsy.  2. History of vascular access.  3. History of appendectomy.  4. History of cesarean section.  5. History of tubal ligation.  6. History of tonsillectomy.    ALLERGIES:  INCLUDE:  1. CODEINE, WHICH CAUSES A RASH.  2. PENICILLIN, WHICH CAUSES HIVES.  3. PREDNISONE, A RASH.  4. KETORALAC, A RASH.  5. SHELLFISH, A RASH.  6. IV DYE, A RASH.  7. INFLUENZA VIRUS VACCINE, SOME SWELLING.  8. PNEUMOVAX.  9. MORPHINE.  10. DILAUDID.  11. DEMEROL.    SOCIAL HISTORY:  Does not smoke or use tobacco.    FAMILY HISTORY:  Noncontributory.    PRESENT MEDICATIONS:  Include:  1. Klonopin 0.5 t.i.d.  2. Cymbalta 60 mg daily.   3. Arixtra 7.5 subcutaneously daily.  4. Bactroban.  5. OxyContin.  6. Percocet.  7. Saline.  8. Ultram.    REVIEW OF SYSTEM:  Otherwise negative except for this pleuritic pain.  Denies nausea, vomiting, lower intestinal symptoms.. No burning or  frequency of urine.  No headaches, double vision, arthritic complaints. No  fits, faints or seizures.    EXAMINATION  VITAL SIGNS:  Her temperature is 97.5, pulse 86, respirations 20, blood  pressure 142/80.  HEAD AND NECK:  Normocephalic. No cervical, supraclavicular, or axillary  lymphadenopathy. Trachea is midline. Thyroid not enlarged.  CHEST:  Clear.  CARDIOVASCULAR:  Dual rhythm. No extra heart sounds or murmurs. Port in  the right anterior chest.  ABDOMEN:  Soft and nontender. Liver and spleen are not enlarged.  EXTREMITIES:  Warm without cyanosis or fluid retention. No calf  tenderness. No jaundice or rashes.  NEUROLOGICALLY:  Alert.    CHEST X-RAY:  Clear.    REPEAT V/Q SCAN:  Identical to 2008 and 2009.    LAB WORK  1. Shows a progressive anemia, hemoglobin of 9.6. The indices are  normochromic, normocytic. The white count is 5100, platelet count  200,000.  2. The kidney function is normal. BUN is 10.  3. Vitamin B12 is 263, transferrin saturation is 8, iron is 37, TIBC is  37.  4. Red blood cell folate is pending.  5. The D-dimer is low.  6. INR is normal at 1.    DISPOSITION  1. Patient with so-called factor V Leiden and recurrent pulmonary emboli,  right-sided pleuritic pain with no changes on V/Q scan.  2. No other familial history of thromboembolic disease. Father is  deceased from colon cancer. A maternal grandmother from breast cancer  with metastasis.  3. History of recurrent infections.  4. Given the so-called documentation of recurrent pulmonary emboli, in a  woman without precipitating factors, I would go more by the history than  the presence, yes or no, of factor V Leiden. She apparently is on Arixtra  and I see no contraindications to continuing it.   5. Will try and retrieve the actual factor V Leiden result, wherever that  was performed.  6. It is actually commonly described as Factor V Leiden if a tendency  more for venous thrombosis rather than pulmonary embolus, particularly if  she is heterozygote. This may not be the precipitating factor behind the  PE, but nevertheless since she is on long-term anticoagulation, further  work up makes little sense.  7. As I retrieve more records I will see if I make any further  recommendations but to continue as is.                Thayer Ohm, MD                This is an unverified document unless signed by physician.    TID:  [id number]                                      DT:  10/04/2008  1:35 P  JOB:  [id number]        DOC#:  [id number]           DD:  10/04/2008    cc:   Thayer Ohm, MD

## 2008-10-04 NOTE — Progress Notes (Signed)
Sitting in bed, no c/o pain, right chest lifeport site intact, respirations even and unlabored

## 2008-10-04 NOTE — Progress Notes (Signed)
Patient states allergic to tordol can only take demerol and dilaudid . Dr. Nelle Don notified. Order received and read back for demerol 50 mg IV every 8 hrs. As needed.

## 2008-10-04 NOTE — Progress Notes (Signed)
Resting in bed, sleeping, respirations even and unlabored, no s/ sx pain

## 2008-10-04 NOTE — Progress Notes (Signed)
Resting in bed, talking on her cell phone, no c/o pain

## 2008-10-05 LAB — CBC W/O DIFF
HCT: 33 % — ABNORMAL LOW (ref 37.6–48.3)
HGB: 10.6 g/dL — ABNORMAL LOW (ref 11.7–15.0)
MCH: 30 PG (ref 26.1–32.9)
MCHC: 32.1 g/dL (ref 31.4–35.0)
MCV: 93.5 FL (ref 79.6–97.8)
MPV: 11.9 FL (ref 10.8–14.1)
PLATELET: 291 10*3/uL (ref 140–440)
RBC: 3.53 M/uL — ABNORMAL LOW (ref 3.86–5.18)
RDW: 12.6 % (ref 11.9–14.6)
WBC: 8 10*3/uL (ref 4.0–10.5)

## 2008-10-05 LAB — RBC, FOLATE
HEMATOCRIT: 29.7 %
RBC FOLATE: 731 ng/mL (ref 280–903)

## 2008-10-05 MED ADMIN — meperidine (DEMEROL) injection 50 mg: INTRAVENOUS | @ 19:00:00 | NDC 10019016044

## 2008-10-05 MED ADMIN — SALINE PERIPHERAL FLUSH Q8H 5 mL: @ 18:00:00

## 2008-10-05 MED ADMIN — polyethylene glycol (MIRALAX) powder 255 g: ORAL | @ 21:00:00 | NDC 00574041202

## 2008-10-05 MED ADMIN — mupirocin (BACTROBAN) 2 % ointment: NASAL | @ 21:00:00 | NDC 00168035222

## 2008-10-05 MED ADMIN — oxycodone-acetaminophen (PERCOCET 10) 10-325 mg per tablet 1 Tab: ORAL | @ 14:00:00 | NDC 68084037811

## 2008-10-05 MED ADMIN — meperidine (DEMEROL) injection 50 mg: INTRAVENOUS | @ 01:00:00 | NDC 10019016044

## 2008-10-05 MED ADMIN — SALINE PERIPHERAL FLUSH Q8H 5 mL: @ 02:00:00 | NDC 87701099893

## 2008-10-05 MED ADMIN — iron dextran complex (INFED) 1,000 mg in 0.9% sodium chloride 500 mL ivpb: INTRAVENOUS | @ 21:00:00 | NDC 52544093102

## 2008-10-05 MED ADMIN — clonazepam (KLONOPIN) tablet 0.5 mg: ORAL | @ 02:00:00 | NDC 00093083319

## 2008-10-05 MED ADMIN — magnesium citrate solution 296 mL: ORAL | @ 21:00:00

## 2008-10-05 MED ADMIN — duloxetine (CYMBALTA) capsule 60 mg: ORAL | @ 13:00:00 | NDC 00002323730

## 2008-10-05 MED ADMIN — diphenhydrAMINE (BENADRYL) capsule 50 mg: ORAL | @ 12:00:00 | NDC 00904530661

## 2008-10-05 MED ADMIN — oxycodone-acetaminophen (PERCOCET 10) 10-325 mg per tablet 1 Tab: ORAL | @ 07:00:00 | NDC 68084037811

## 2008-10-05 MED ADMIN — methylPREDNISolone (SOLU-MEDROL) injection 40 mg: INTRAVENOUS | @ 09:00:00 | NDC 00409568423

## 2008-10-05 MED ADMIN — methylPREDNISolone (SOLU-MEDROL) injection 40 mg: INTRAVENOUS | @ 05:00:00 | NDC 00409568423

## 2008-10-05 MED ADMIN — famotidine (PEPCID) tablet 40 mg: ORAL | @ 12:00:00 | NDC 51079096601

## 2008-10-05 MED ADMIN — clonazepam (KLONOPIN) tablet 0.5 mg: ORAL | @ 21:00:00 | NDC 00093083319

## 2008-10-05 MED ADMIN — mupirocin (BACTROBAN) 2 % ointment: NASAL | @ 13:00:00 | NDC 45802011222

## 2008-10-05 MED ADMIN — saline peripheral flush 5 mL: @ 12:00:00 | NDC 87701099893

## 2008-10-05 MED ADMIN — diphenhydrAMINE (BENADRYL) capsule 100 mg: ORAL | @ 02:00:00 | NDC 00904530661

## 2008-10-05 MED ADMIN — diphenhydrAMINE (BENADRYL) capsule 25 mg: ORAL | @ 19:00:00 | NDC 00182049210

## 2008-10-05 MED ADMIN — SALINE PERIPHERAL FLUSH Q8H 5 mL: @ 09:00:00 | NDC 87701099893

## 2008-10-05 MED ADMIN — ioversol (OPTIRAY) 350 mg/mL contrast solution 100 mL: INTRAVENOUS | @ 20:00:00 | NDC 00019133361

## 2008-10-05 MED ADMIN — clonazepam (KLONOPIN) tablet 0.5 mg: ORAL | @ 12:00:00 | NDC 00093083319

## 2008-10-05 MED ADMIN — bisacodyl (DULCOLAX) tablet 10 mg: ORAL | @ 20:00:00 | NDC 00904792761

## 2008-10-05 MED ADMIN — meperidine (DEMEROL) injection 50 mg: INTRAVENOUS | @ 11:00:00 | NDC 10019016044

## 2008-10-05 MED FILL — MAGNESIUM CITRATE ORAL SOLN: ORAL | Qty: 300

## 2008-10-05 MED FILL — OXYCODONE-ACETAMINOPHEN 10 MG-325 MG TAB: 10-325 mg | ORAL | Qty: 1

## 2008-10-05 MED FILL — SOLU-MEDROL 40 MG/ML SOLUTION FOR INJECTION: 40 mg/mL | INTRAMUSCULAR | Qty: 1

## 2008-10-05 MED FILL — CLONAZEPAM 1 MG TAB: 1 mg | ORAL | Qty: 1

## 2008-10-05 MED FILL — MEPERIDINE (PF) 50 MG/ML IJ SOLN: 50 mg/mL | INTRAMUSCULAR | Qty: 1

## 2008-10-05 MED FILL — DIPHENHYDRAMINE 25 MG CAP: 25 mg | ORAL | Qty: 2

## 2008-10-05 MED FILL — BISACODYL 5 MG TAB, DELAYED RELEASE: 5 mg | ORAL | Qty: 2

## 2008-10-05 MED FILL — DIPHENHYDRAMINE 25 MG CAP: 25 mg | ORAL | Qty: 4

## 2008-10-05 MED FILL — INFED 50 MG/ML INJECTION SOLUTION: 50 mg/mL | INTRAMUSCULAR | Qty: 20

## 2008-10-05 MED FILL — POLYETHYLENE GLYCOL 3350 100 % ORAL POWDER: 17 gram/dose | ORAL | Qty: 255

## 2008-10-05 MED FILL — DIPHENHYDRAMINE 25 MG CAP: 25 mg | ORAL | Qty: 1

## 2008-10-05 MED FILL — FAMOTIDINE 20 MG TAB: 20 mg | ORAL | Qty: 2

## 2008-10-05 MED FILL — ARIXTRA 7.5 MG/0.6 ML SUBCUTANEOUS SOLUTION SYRINGE: 7.5 mg/0.6 mL | SUBCUTANEOUS | Qty: 0.6

## 2008-10-05 MED FILL — CYMBALTA 60 MG CAPSULE,DELAYED RELEASE: 60 mg | ORAL | Qty: 1

## 2008-10-05 MED FILL — LACTATED RINGERS IV: INTRAVENOUS | Qty: 1000

## 2008-10-05 NOTE — Progress Notes (Signed)
Today's Date: 10/05/2008  Date of Admission: 10/03/2008      Subjective:   Not much change, pain may be a little better    Review of Systems  Pertinent items are noted in HPI.    Medications have been reviewed.      Objective:   Filed Vitals:    10/04/2008  3:25 PM 10/04/2008  7:00 PM 10/04/2008  9:21 PM 10/05/2008  7:00 AM   BP: 101/61 114/61  103/73   Pulse: 70 80  86   Temp: 98.4 ??F (36.9 ??C) 98.5 ??F (36.9 ??C)  97.5 ??F (36.4 ??C)   Resp: 18 18 16 20    Height:       Weight:       SpO2: 93% 91%  89%         Intake and output:     In: 885 (885 P.O.)  Out: 500 (500 Urine)        Physical Exam  Gen- the patient is in no distress.    HEENT- PERRL, EOMI, no scleral icterus       oral muscosa moist without cyanosis.  Neck- the neck is supple; there is no JVD.   Lungs- clear  Heart- RRR. There is no murmur.  Abd- soft and non-tender, positive bowel sounds.  Ext- warm without cyanosis. There is no edema.  Skin- no jaundice or rashes.  Neuro- alert and oriented x 3. No gross sensorimotor deficits are present.    LAB      Recent Labs   [facility] 10/05/08 0600 10/03/08 1912   ??? WBC 8.0 5.1   ??? HGB 10.6* 9.6*   ??? HCT 33.0* 29.7*   ??? PLT 291 200       Recent Labs   [facility] 10/03/08 1912   ??? NA 132*   ??? K 3.7   ??? CL 101   ??? GLU 95   ??? CO2 26   ??? BUN 10   ??? CREA 0.8   ??? MG --   ??? PHOS --   ??? TROIP --   ??? 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

## 2008-10-05 NOTE — Progress Notes (Signed)
[facility] Progress Note        Admit Date: 10/03/2008    CC: Fe defic anemia, epi pain    Subjective:     Patient reports epi/ruq pain.  Some N, no V.  Mult BMs p mag citrate yest.  No overt bleeding.    Medications:  Current facility-administered medications   Medication Dose Route Frequency   ??? duloxetine (CYMBALTA) capsule 60 mg  60 mg Oral DAILY   ??? fondaparinux (ARIXTRA) injection 7.5 mg  7.5 mg SubCUTAneous DAILY   ??? clonazepam (KLONOPIN) tablet 0.5 mg  0.5 mg Oral TID   ??? oxycodone-acetaminophen (PERCOCET 10) 10-325 mg per tablet 1 Tab  1 Tab Oral Q4H PRN   ??? mupirocin (BACTROBAN) 2 % ointment    Nasal BID   ??? tramadol (ULTRAM) tablet 50 mg  50 mg Oral Q6H PRN   ??? famotidine (PEPCID) tablet 40 mg  40 mg Oral ON CALL   ??? diphenhydrAMINE (BENADRYL) capsule 100 mg  100 mg Oral QHS   ??? diphenhydrAMINE (BENADRYL) capsule 50 mg  50 mg Oral ON CALL   ??? methylPREDNISolone (SOLU-MEDROL) injection 40 mg  40 mg IntraVENous Q6H   ??? iron dextran complex (INFED) 25 mg in 0.9% sodium chloride 50 mL ivpb  25 mg IntraVENous ONCE   ??? iron dextran complex (INFED) 1,000 mg in 0.9% sodium chloride 500 mL ivpb  1,000 mg IntraVENous DAILY   ??? magnesium citrate solution 296 mL  296 mL Oral NOW   ??? meperidine (DEMEROL) injection 50 mg  50 mg IntraVENous Q8H PRN   ??? DISCONTD: zolpidem (AMBIEN) tablet 10 mg  10 mg Oral QHS   ??? DISCONTD: predniSONE (DELTASONE) tablet 20 mg  20 mg Oral Q6H   ??? DISCONTD: meperidine (DEMEROL) injection 50 mg  50 mg IntraMUSCular Q8H PRN   ??? SALINE PERIPHERAL FLUSH Q8H 5 mL  5 mL InterCATHeter Q8H   ??? saline peripheral flush 5 mL  5 mL InterCATHeter PRN         ROS: Otherwise negative in 10 systems except as noted above in Subjective.    Objective:   Vitals:  BP 103/73   Pulse 86   Temp 97.5 ??F (36.4 ??C)   Resp 20   Ht 5\' 4"  (1.626 m)   Wt 149 lb (67.586 kg)   SpO2 89%   LMP 07/28/2004    Intake/Output:     In: 885 (885 P.O.)  Out: 500 (500 Urine)      Exam:   General appearance: alert, no distress  Lungs: clear to auscultation bilaterally  Heart: regular rate and rhythm  Abdomen: soft, non-distended, mod tender epi/RUQ. Bowel sounds normal. No masses,  no organomegaly  Extremities: extremities normal, no cyanosis or edema    Data Review (Labs):    Recent Results (from the past 24 hour(s))   URINALYSIS W/O MICRO    Collection Time    10/04/08  6:40 PM   Component Value Range   ??? Color STRAW     ??? Appearance HAZY     ??? Specific gravity 1.010  1.001 - 1.023 ( )   ??? pH 6.5  5.0 - 9.0 ( )   ??? Protein NEGATIVE   NEGATIVE (MG/DL)   ??? Glucose NEGATIVE   NEGATIVE (MG/DL)   ??? Ketone NEGATIVE   NEGATIVE (MG/DL)   ??? Bilirubin NEGATIVE   NEGATIVE    ??? Blood LARGE (*) NEGATIVE    ??? Urobilinogen 0.2  0.2 -  1.0 (EU/DL)   ??? Nitrites NEGATIVE   NEGATIVE    ??? Leukocyte Esterase NEGATIVE   NEGATIVE    URINE MICROSCOPIC    Collection Time    10/04/08  6:40 PM   Component Value Range   ??? WBC 0  0 (/HPF)   ??? RBC 20-50  0 (/HPF)   ??? Epithelial cells 10-20  0 (/HPF)   ??? Bacteria 0  0 (/HPF)   ??? Casts 0  0 (/LPF)   ??? Crystals 0  0 (/LPF)   ??? Amorphous Crystals TRACE  0    ??? Mucus 0  0 (/LPF)   CBC W/O DIFF    Collection Time    10/05/08  6:00 AM   Component Value Range   ??? WBC 8.0  4.0 - 10.5 (K/uL)   ??? RBC 3.53 (*) 3.86 - 5.18 (M/uL)   ??? HGB 10.6 (*) 11.7 - 15.0 (g/dL)   ??? HCT 33.0 (*) 37.6 - 48.3 (%)   ??? MCV 93.5  79.6 - 97.8 (FL)   ??? MCH 30.0  26.1 - 32.9 (PG)   ??? MCHC 32.1  31.4 - 35.0 (g/dL)   ??? RDW 12.6  11.9 - 14.6 (%)   ??? PLATELET 291  140 - 440 (K/uL)   ??? MPV 11.9  10.8 - 14.1 (FL)         Assessment:     Patient Active Hospital Problem List:  Pulmonary embolism, Recurrent (10/03/2008)    Pleuritic chest pain (10/04/2008)    Factor V deficiency (10/04/2008)    Patient noncompliance (10/04/2008)    Anemia (10/04/2008)      Plan:     1. Hgb stable, no active bleeding.  Chronic anemia, c/w FE defic.  2. Plan EGD/Colo in am.  WIll keep on clears today, prep this afternoon.   3. Plans for CT today after allergy prep.  4. COnt PPI.  Hold arixtra in am.   5. Risk/benefits discussed, pt agrees.   6. Pt w/ h/o narc/benzo use, will arrange w/ MAC

## 2008-10-05 NOTE — Progress Notes (Signed)
Driscilla Grammes NP here, orders received for now dose of benadryl 25mg  po, call to ct to inform of change,med given

## 2008-10-05 NOTE — Progress Notes (Signed)
Colon prep started after pt returns to rm from ct,

## 2008-10-05 NOTE — Progress Notes (Signed)
Pt in bed with resp even and unlabored.  No c/o pain and no distress noted.  Vs stable. Instructed patient to call should needs arise.  Voiced understanding.

## 2008-10-05 NOTE — Progress Notes (Signed)
Co severe pain all over, pt very calm, resp 16 even and non labored, skin warm and dry, percocet po at this time

## 2008-10-05 NOTE — Progress Notes (Signed)
BPA request.   Provided support during initial visit through presence, active listening and reflection, and prayer.Patient was able to talk about her spirituality and her desire for growth.        Signed- Thomas Hoff., Staff Chaplain

## 2008-10-06 LAB — ERYTHROPOIETIN: ERYTHOPOIETIN: 49 mU/mL

## 2008-10-06 MED ADMIN — clonazepam (KLONOPIN) tablet 0.5 mg: ORAL | @ 13:00:00 | NDC 72888015330

## 2008-10-06 MED ADMIN — meperidine (DEMEROL) injection 50 mg: INTRAVENOUS | @ 12:00:00 | NDC 10019016044

## 2008-10-06 MED ADMIN — clonazepam (KLONOPIN) tablet 0.5 mg: ORAL | @ 20:00:00 | NDC 00093083319

## 2008-10-06 MED ADMIN — SALINE PERIPHERAL FLUSH Q8H 5 mL: @ 10:00:00 | NDC 17474300205

## 2008-10-06 MED ADMIN — duloxetine (CYMBALTA) capsule 60 mg: ORAL | @ 12:00:00 | NDC 00002327001

## 2008-10-06 MED ADMIN — meperidine (DEMEROL) injection 50 mg: INTRAVENOUS | @ 20:00:00 | NDC 10019016044

## 2008-10-06 MED ADMIN — meperidine (DEMEROL) injection 50 mg: INTRAVENOUS | @ 03:00:00 | NDC 10019016044

## 2008-10-06 MED ADMIN — clonazepam (KLONOPIN) tablet 0.5 mg: ORAL | NDC 00093083319

## 2008-10-06 MED ADMIN — duloxetine (CYMBALTA) capsule 60 mg: ORAL | @ 13:00:00 | NDC 82009003210

## 2008-10-06 MED ADMIN — SALINE PERIPHERAL FLUSH Q8H 5 mL: NDC 17474300205

## 2008-10-06 MED ADMIN — SALINE PERIPHERAL FLUSH Q8H 5 mL: @ 23:00:00 | NDC 87701099893

## 2008-10-06 MED ADMIN — mupirocin (BACTROBAN) 2 % ointment: NASAL | @ 23:00:00 | NDC 00168035222

## 2008-10-06 MED ADMIN — lactated ringers infusion: INTRAVENOUS | @ 15:00:00 | NDC 00409795309

## 2008-10-06 MED ADMIN — oxycodone-acetaminophen (PERCOCET 10) 10-325 mg per tablet 1 Tab: ORAL | NDC 68084037811

## 2008-10-06 MED ADMIN — benzocaine (HURRICANE) 20 % spray: @ 17:00:00 | NDC 99990003862

## 2008-10-06 MED ADMIN — simethicone (MYLICON) 40mg/0.6mL oral drops 80 mg: ORAL | @ 17:00:00 | NDC 00182219166

## 2008-10-06 MED ADMIN — famotidine (PF) (PEPCID) injection 20 mg: INTRAVENOUS | @ 15:00:00 | NDC 10019004517

## 2008-10-06 MED ADMIN — lorazepam (ATIVAN) injection 1 mg: INTRAVENOUS | @ 19:00:00 | NDC 10019010239

## 2008-10-06 MED ADMIN — mupirocin (BACTROBAN) 2 % ointment: NASAL | @ 13:00:00 | NDC 81033002099

## 2008-10-06 MED ADMIN — clonazepam (KLONOPIN) tablet 0.5 mg: ORAL | @ 12:00:00 | NDC 00093083319

## 2008-10-06 MED FILL — MEPERIDINE (PF) 50 MG/ML IJ SOLN: 50 mg/mL | INTRAMUSCULAR | Qty: 1

## 2008-10-06 MED FILL — CLONAZEPAM 1 MG TAB: 1 mg | ORAL | Qty: 1

## 2008-10-06 MED FILL — BENZOCAINE 20 % MUCOSAL AEROSOL SPRAY: 20 % | Qty: 60

## 2008-10-06 MED FILL — FENTANYL CITRATE (PF) 50 MCG/ML IJ SOLN: 50 mcg/mL | INTRAMUSCULAR | Qty: 2

## 2008-10-06 MED FILL — GENASYME 40 MG/0.6 ML ORAL DROPS,SUSPENSION: 40 mg/0.6 mL | ORAL | Qty: 30

## 2008-10-06 MED FILL — OXYCODONE-ACETAMINOPHEN 10 MG-325 MG TAB: 10-325 mg | ORAL | Qty: 1

## 2008-10-06 MED FILL — INFED 50 MG/ML INJECTION SOLUTION: 50 mg/mL | INTRAMUSCULAR | Qty: 20

## 2008-10-06 MED FILL — LORAZEPAM 2 MG/ML IJ SOLN: 2 mg/mL | INTRAMUSCULAR | Qty: 1

## 2008-10-06 MED FILL — FAMOTIDINE (PF) 20 MG/2 ML IV: 20 mg/2 mL | INTRAVENOUS | Qty: 2

## 2008-10-06 MED FILL — CYMBALTA 60 MG CAPSULE,DELAYED RELEASE: 60 mg | ORAL | Qty: 1

## 2008-10-06 NOTE — Procedures (Signed)
Patient was sedated with MAC anesthesia    The CH180 ALT colonoscope was introduced into the rectum and passed under direct visualization to the cecum and then into the terminal ileum. Extrinsic compression was required to negotiate the redundant sigmoid.  Bowel was carefully re-examined during withdrawal    Ileum: Normal  Colon: Normal  Rectum: Normal    Impression: Normal colonoscopy with internal hemorrhoids    JW. Krista Lopez.D.

## 2008-10-06 NOTE — Procedures (Signed)
Patient was sedated with MAC anesthesia    The CH180 ALT colonoscope was introduced into the rectum and passed under direct visualization to the cecum and then into the terminal ileum. Extrinsic compression was required to negotiate the redundant sigmoid.  Bowel was carefully re-examined during withdrawal    Ileum: Normal  Colon: Normal  Rectum: Normal    Impression: Normal colonoscopy with internal hemorrhoids    JW. Holt M.D.

## 2008-10-06 NOTE — Progress Notes (Signed)
Transport here to take pt back to room 815.

## 2008-10-06 NOTE — Progress Notes (Signed)
TRANSFER - IN REPORT:    Verbal report received from Melissa Read, RN (name) on Krista Lopez  being received from GI Lab(unit) for routine progression of care      Report consisted of patient???s Situation, Background, Assessment and   Recommendations(SBAR).     Information from the following report(s) Procedure Summary was reveiwed with the receiving nurse.    Opportunity for questions and clarification was provided.      Assessment completed upon patient???s arrival to unit and care assumed.

## 2008-10-06 NOTE — Progress Notes (Signed)
TRANSFER - OUT REPORT:    Verbal report given to Ms Washington,RN(name) on Krista Lopez  being transferred to 815(unit) for routine progression of care       Report consisted of patient???s Situation, Background, Assessment and   Recommendations(SBAR).     Information from the following report(s) Kardex, Procedure Summary and MAR was reviewed with the receiving nurse.    Opportunity for questions and clarification was provided.

## 2008-10-06 NOTE — Progress Notes (Signed)
Pt crying. Appears anxious. States chest feels tight and anus "hurts". Called Dr Leonor Liv. Orders rec'd

## 2008-10-06 NOTE — Progress Notes (Signed)
Patient given her am medications with a small sip of liquid.  Medicated with 50 mg Demerol IV for complaints of severe pain.  Reminded to remain NPO for Colonoscopy, verbalized understanding.  Right chest wall Life Port accessed and patent per Charity fundraiser.  Demonstrates no signs of dyspnea or distress.  Will continue to monitor.

## 2008-10-06 NOTE — Progress Notes (Signed)
Patient resting in bed with eyes closed. No pain noted. Will continue to monitor.

## 2008-10-06 NOTE — Progress Notes (Addendum)
Lactated Ringers started at 75 ml/hr via Right Lifeport.  20 mg Pepcid given IV per MD order by Darcella Cheshire, RN.  Patient voided and was transported to GI Lab via stretcher.

## 2008-10-06 NOTE — Discharge Summary (Addendum)
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 Shows a progressive anemia, hemoglobin of 9.6. The indices are  normochromic, normocytic. The white count is 5100, platelet count  200,000.  2. The kidney function is normal. BUN is 10.  3. Vitamin B12 is 263, transferrin saturation is 8, iron is 37, TIBC is  37.  4. Red blood cell folate is pending.  5. The D-dimer is low.  6. INR is normal at 1.    DISPOSITION  1. Patient with so-called factor V Leiden   2. No other familial history of thromboembolic disease. Father is  deceased from colon cancer. A maternal grandmother from breast cancer  with metastasis.  3. Given the so-called documentation of recurrent pulmonary emboli, in a  woman without precipitating factors, I would go more by the history than  the presence, yes or no, of factor V Leiden. She apparently is on Arixtra  and I see no contraindications to continuing it.  5. Will try and retrieve the actual factor V Leiden result, wherever that  was performed.  6. It is actually commonly described as Factor V Leiden if a tendency  more for venous thrombosis rather than pulmonary embolus, particularly if  she is heterozygote. This may not be the precipitating factor behind the  PE, but nevertheless since she is on long-term anticoagulation, further  work up makes little sense.     GI associates- Anemia:    Studies/Procedures:  CT chest:  IMPRESSION: ??  1. NO EVIDENCE FOR PULMONARY EMBOLISM.  2. CENTRAL GROUND GLASS OPACITIES WITH SUPERIMPOSED PATCHY AIR SPACE   OPACITIES OF THE LUNGS. ??DIFFERENTIAL CONSIDERATIONS INCLUDE ATYPICAL   INFECTION VERSUS EARLY PULMONARY EDEMA. ??RECOMMEND CLINICAL CORRELATION. ??    EGD/Colonoscopy: 10/06/2008    Hospital course:  Krista Lopez is a 43 y.o. Caucasian female admitted to the floor from the ED with right pleuritic chest pain of unclear etiology.?? This lady is very well-known to this practice having been officially "terminated" on at least two occasions because of noncompliance.?? 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 More recently she has allegedly become "allergic" to radiographic dyes making documentation of true recurrent emboli difficult without the ability to do a PE protocol CT scan of the chest.?? Unfortunately, ventilation perfusion lung scans have become the predominant radiographic method of evaluating her.?? Most recently, she has had unchanged scans here in both 2008 as well as in April of 2009.?? In addition to her noncompliance, she frequently "doctor shops" having most recently being hospitalized in [place], [place].?? While there, she had a port placed in her right anterior chest because of venous access difficulty.?? Additionally, she was started on daily  subcutaneous Arixtra.    She was seen in the emergency department here complaining of chest pain.?? However, she was discharged home at that time.?? She returned this evening complaining of severe right-sided posterior pain that was pleuritic in nature.??Shehas been on Arixtra and there was a question as to whether she could continue indefinitely; she is admitted for further evaluation and for pleuritic pain.     She received pain medication and had a 24h pre- medication for allergy prevention prior to CT Chest as requested per Dr. Criselda Peaches which was negative for PTE.  We also had Dr. Everlene Other see her to further review her arixtra needs on a long term basis.  She will continue arixtra after discharge per his recommendations.  Her hgb remained stable but Dr. Everlene Other consulted GI associates for further work- up for anemia.  She underwent a EGD/colonoscopy prior discharge to further evaluate bleeding.        Followup/Outpt Studies           Hematology   GI associated  Patient has been previously dismissed from our practice. FU her primary care.  Amber C. Luanne Bras, NP

## 2008-10-06 NOTE — Progress Notes (Signed)
Patient resting quietly in bed. Alert and able to make needs known. Speech is clear. Patient complains of pain of 7 out of 10 given prn demerol per order. Assessment completed and documented. Will continue to monitor.

## 2008-10-06 NOTE — Procedures (Signed)
Patient was sedated with MAC anesthesia.    The GIF Q160 videoendoscope was passed easily into the esophagus And then to the second duodenum.    Esophagus: There was a single 1 mm erosion at the squamocolumnar junction without stricture and with no evidence of Barrett's.  There was no hiatal hernia.    Stomach: Proximal stomach was normal.  There were two tiny healing erosions in the antrum with no exudate.    Duodenum: Normal    Impression: Mild erosive esophagitis and minimal erosive gastritis.    Elesa Hacker.D.

## 2008-10-06 NOTE — Progress Notes (Signed)
Pt remains tearful. States pain is at "butthole". Dr Criselda Peaches at bs.

## 2008-10-06 NOTE — Progress Notes (Addendum)
DAILY PROGRESS NOTE:    Today's Date: 10/06/2008  Date of Admission: 10/03/2008    The patient's chart has been reviewed and the patient has been discussed with nursing staff.  Subjective:   Seen in recovery after EGD and colonoscopy  On 2 liters NC  Crying  Will wait on discharge until am    Review of SystemsGastrointestinal: positive for rectal pain    Objective:   Filed Vitals:    10/06/2008  2:00 PM 10/06/2008  2:15 PM 10/06/2008  2:30 PM 10/06/2008  2:45 PM   BP: 102/56 103/66 105/57 122/73   Pulse: 70 68 68 80   Temp:       Resp: 16 16 16 18    Height:       Weight:       SpO2: 96% 96% 92% 96%         In: 400 (400 I.V.)  Out: 0   In: 1191.5 (715 P.O. 476.5 I.V.)  Out: -     Physical Exam  Gen- the patient is well developed and in emotional distress.  Crying.  HEENT- PERRL, EOMI, no scleral icterus       oral muscosa moist without cyanosis.  Neck- the neck is supple; there is no JVD.   Lungs- diminished bilaterally- 2 liters post- anesthesia  Heart- RRR. There is no murmur.  Abd- soft and non-tender, unremarkable bowel sounds.  Ext- warm without cyanosis. There is no edema.  Skin- no jaundice or rashes.  Neuro- alert and oriented x 3. No gross sensorimotor deficits are present.    Nutrition: regular  Activity:  As tolerated      LAB  Recent Labs   [facility] 10/05/08 0600 10/03/08 1912   ??? WBC 8.0 5.1   ??? HGB 10.6* 9.6*   ??? HCT 33.0* 29.7*   ??? PLT 291 200       Recent Labs   Basename 10/03/08 1912   ??? NA 132*   ??? K 3.7   ??? CL 101   ??? GLU 95   ??? CO2 26   ??? BUN 10   ??? CREA 0.8       Assessment:     Hospital Problems Date Reviewed: 10/06/2008      Class Noted    Iron deficiency [275.0C]  10/06/2008    Overview     Post infed for endoscopy          Pleuritic chest pain [786.52D]  10/04/2008    Better.    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           Plan:  Continue present measures  Pain control  Home in am       Amber C. Luanne Bras, NP  I have spoken with and examined the patient. I have reviewed the history, examination, assessment, and plan and agree with the above note.

## 2008-10-06 NOTE — Progress Notes (Signed)
Hem- Onc Progress Note    Admit Date: 10/03/2008  Subjective/Chief Complaint   r sided abdominal pain   Infectious changes on ct scan  Post infed      Review of Systems    Objective:   Blood pressure 114/65, pulse 80, temperature 97.5 ??F (36.4 ??C), resp. rate 20, height 5\' 4"  (1.626 m), weight 149 lb (67.586 kg), last menstrual period 07/28/2004, SpO2 97.00%.    Temp (24hrs), Avg:96.8 ??F (36 ??C), Min:96 ??F (35.6 ??C), Max:97.5 ??F (36.4 ??C)      Intake/Output Summary (Last 24 hours) at 10/06/08 0714  Last data filed at 10/05/08 2200   Gross per 24 hour   Intake  716.5 ml   Output      0 ml   Net  716.5 ml       No results found for this or any previous visit (from the past 24 hour(s)).  No results found for this basename: AGAG, XCMVPT                 Physical Exam:   Chest is clear.   Cardiovascular - Regular, Rate and Rhythm.  No extra heart sounds or murmurs.  Abdomen is soft. Liver and spleen are not enlarged.    No clubbing, cyanosis or scleral icterus.      Antiinfectives:     Current facility-administered medications   Medication Dose Route Frequency   ??? polyethylene glycol (MIRALAX) powder 255 g  255 g Oral ONCE   ??? bisacodyl (DULCOLAX) tablet 10 mg  10 mg Oral NOW   ??? magnesium citrate solution 296 mL  296 mL Oral NOW   ??? ioversol (OPTIRAY) 350 mg/mL contrast solution 100 mL  100 mL IntraVENous RAD ONCE   ??? diphenhydrAMINE (BENADRYL) capsule 25 mg  25 mg Oral NOW   ??? famotidine (PF) (PEPCID) injection 20 mg  20 mg IntraVENous ON CALL TO OR   ??? lactated ringers infusion    IntraVENous CONTINUOUS   ??? duloxetine (CYMBALTA) capsule 60 mg  60 mg Oral DAILY   ??? clonazepam (KLONOPIN) tablet 0.5 mg  0.5 mg Oral TID   ??? oxycodone-acetaminophen (PERCOCET 10) 10-325 mg per tablet 1 Tab  1 Tab Oral Q4H PRN   ??? mupirocin (BACTROBAN) 2 % ointment    Nasal BID   ??? tramadol (ULTRAM) tablet 50 mg  50 mg Oral Q6H PRN   ??? famotidine (PEPCID) tablet 40 mg  40 mg Oral ON CALL    ??? diphenhydrAMINE (BENADRYL) capsule 50 mg  50 mg Oral ON CALL   ??? iron dextran complex (INFED) 1,000 mg in 0.9% sodium chloride 500 mL ivpb  1,000 mg IntraVENous DAILY   ??? meperidine (DEMEROL) injection 50 mg  50 mg IntraVENous Q8H PRN   ??? DISCONTD: fondaparinux (ARIXTRA) injection 7.5 mg  7.5 mg SubCUTAneous DAILY   ??? SALINE PERIPHERAL FLUSH Q8H 5 mL  5 mL InterCATHeter Q8H   ??? saline peripheral flush 5 mL  5 mL InterCATHeter PRN            Impression:     Patient Active Hospital Problem List:  Pulmonary embolism, Recurrent (10/03/2008)    Pleuritic chest pain (10/04/2008)    Factor V deficiency (10/04/2008)    Patient noncompliance (10/04/2008)    Iron deficiency (10/06/2008)  Post infed for endoscopy      Plan:   Colonoscopy   egd   Upper abdominal pain    Post infed  Seems like problem at  this moment is not pe    Dr. Thayer Ohm  [facility]  [facility]  [address]  [place] [address]  Office : [phone]  Fax : [phone]  Cell : [phone]

## 2008-10-06 NOTE — Procedures (Signed)
Patient was sedated with MAC anesthesia.    The GIF Q160 videoendoscope was passed easily into the esophagus And then to the second duodenum.    Esophagus: There was a single 1 mm erosion at the squamocolumnar junction without stricture and with no evidence of Barrett's.  There was no hiatal hernia.    Stomach: Proximal stomach was normal.  There were two tiny healing erosions in the antrum with no exudate.    Duodenum: Normal    Impression: Mild erosive esophagitis and minimal erosive gastritis.    Tayte Childers. W. Holt M.D.

## 2008-10-06 NOTE — Progress Notes (Signed)
Pt remains tearful. C/O anus pain and crying. Ativan .5mg  iv given per order.

## 2008-10-07 MED ORDER — OXYCODONE-ACETAMINOPHEN 10 MG-325 MG TAB
10-325 mg | ORAL_TABLET | ORAL | Status: DC | PRN
Start: 2008-10-07 — End: 2008-10-26

## 2008-10-07 MED ADMIN — clonazepam (KLONOPIN) tablet 0.5 mg: ORAL | @ 01:00:00 | NDC 00093083319

## 2008-10-07 MED ADMIN — oxycodone-acetaminophen (PERCOCET 10) 10-325 mg per tablet 1 Tab: ORAL | @ 13:00:00 | NDC 68084037811

## 2008-10-07 MED ADMIN — meperidine (DEMEROL) injection 50 mg: INTRAVENOUS | @ 03:00:00 | NDC 10019016044

## 2008-10-07 MED ADMIN — SALINE PERIPHERAL FLUSH Q8H 5 mL: @ 01:00:00 | NDC 87701099893

## 2008-10-07 MED ADMIN — oxycodone-acetaminophen (PERCOCET 10) 10-325 mg per tablet 1 Tab: ORAL | @ 09:00:00 | NDC 68084037811

## 2008-10-07 MED ADMIN — mupirocin (BACTROBAN) 2 % ointment: NASAL | @ 13:00:00 | NDC 45802011222

## 2008-10-07 MED ADMIN — SALINE PERIPHERAL FLUSH Q8H 5 mL: @ 10:00:00 | NDC 87701099893

## 2008-10-07 MED ADMIN — oxycodone-acetaminophen (PERCOCET 10) 10-325 mg per tablet 1 Tab: ORAL | @ 18:00:00 | NDC 68084037811

## 2008-10-07 MED ADMIN — SALINE PERIPHERAL FLUSH Q8H 5 mL: @ 15:00:00 | NDC 17474300205

## 2008-10-07 MED ADMIN — clonazepam (KLONOPIN) tablet 0.5 mg: ORAL | @ 13:00:00 | NDC 00093083319

## 2008-10-07 MED ADMIN — clonazepam (KLONOPIN) tablet 0.5 mg: ORAL | @ 18:00:00 | NDC 00093083319

## 2008-10-07 MED ADMIN — duloxetine (CYMBALTA) capsule 60 mg: ORAL | @ 13:00:00 | NDC 00002327001

## 2008-10-07 MED ADMIN — meperidine (DEMEROL) injection 50 mg: INTRAVENOUS | @ 11:00:00 | NDC 10019016044

## 2008-10-07 MED ADMIN — iron dextran complex (INFED) 1,000 mg in 0.9% sodium chloride 500 mL ivpb: INTRAVENOUS | @ 14:00:00 | NDC 52544093102

## 2008-10-07 MED ADMIN — SALINE PERIPHERAL FLUSH Q8H 5 mL: @ 18:00:00

## 2008-10-07 MED FILL — CLONAZEPAM 1 MG TAB: 1 mg | ORAL | Qty: 1

## 2008-10-07 MED FILL — OXYCODONE-ACETAMINOPHEN 10 MG-325 MG TAB: 10-325 mg | ORAL | Qty: 1

## 2008-10-07 MED FILL — MEPERIDINE (PF) 50 MG/ML IJ SOLN: 50 mg/mL | INTRAMUSCULAR | Qty: 1

## 2008-10-07 MED FILL — INFED 50 MG/ML INJECTION SOLUTION: 50 mg/mL | INTRAMUSCULAR | Qty: 20

## 2008-10-07 MED FILL — CYMBALTA 60 MG CAPSULE,DELAYED RELEASE: 60 mg | ORAL | Qty: 1

## 2008-10-07 NOTE — Progress Notes (Signed)
Co pain all over percocet po  At this time

## 2008-10-07 NOTE — Progress Notes (Signed)
Subjective: awake in bed, denies cough and shortness of breath.  Continues to have pleuritic chest pain and is requesting a prescription for Percocet with discharge.  States she is ready to go home.  Room air      Current facility-administered medications   Medication Dose Route Frequency   ??? benzocaine (HURRICANE) 20 % spray    Mucous Membrane ONCE   ??? lorazepam (ATIVAN) injection 1 mg  1 mg IntraVENous ONCE   ??? DISCONTD: simethicone (MYLICON) 40mg /0.13mL oral drops 80 mg  1.2 mL Oral Multiple   ??? famotidine (PF) (PEPCID) injection 20 mg  20 mg IntraVENous ON CALL TO OR   ??? DISCONTD: lactated ringers infusion    IntraVENous CONTINUOUS   ??? duloxetine (CYMBALTA) capsule 60 mg  60 mg Oral DAILY   ??? clonazepam (KLONOPIN) tablet 0.5 mg  0.5 mg Oral TID   ??? oxycodone-acetaminophen (PERCOCET 10) 10-325 mg per tablet 1 Tab  1 Tab Oral Q4H PRN   ??? mupirocin (BACTROBAN) 2 % ointment    Nasal BID   ??? tramadol (ULTRAM) tablet 50 mg  50 mg Oral Q6H PRN   ??? iron dextran complex (INFED) 1,000 mg in 0.9% sodium chloride 500 mL ivpb  1,000 mg IntraVENous DAILY   ??? meperidine (DEMEROL) injection 50 mg  50 mg IntraVENous Q8H PRN   ??? SALINE PERIPHERAL FLUSH Q8H 5 mL  5 mL InterCATHeter Q8H   ??? saline peripheral flush 5 mL  5 mL InterCATHeter PRN          Review of Systems:  Pertinent items noted in HPI  Physical Examination:    BP 99/64   Pulse 58   Temp 97.9 ??F (36.6 ??C)   Resp 18   Ht 5\' 4"  (1.626 m)   Wt 149 lb (67.586 kg)   SpO2 96%   LMP 07/28/2004 O2 Flow Rate (L/min): 2 l/min O2 Device: Room air    In: 876.5 (876.5 I.V.)  Out: 0      Physical Exam  Gen- the patient is well developed and in noistress.??   HEENT- PERRL, EOMI, no scleral icterus  ?????????????????????????????? oral muscosa moist without cyanosis.  Neck- the neck is supple; there is no JVD. ??  Lungs- Bilateral breath sounds clear and diminshed  Heart- RRR. There is no murmur.  Abd- soft and non-tender, unremarkable bowel sounds.  Ext- warm without cyanosis. There is no edema.   Skin- no jaundice or rashes.  Neuro- alert and oriented x 3. No gross sensorimotor deficits are present.            Recent Labs   Basename 10/05/08 0600   ??? WBC 8.0   ??? HGB 10.6*   ??? HCT 33.0*   ??? PLT 291   ??? INR --         No results found for this basename: NA,K,CL,HCO3,GLU,BUN,CREA,CALC,MG,ALB,TBIL,SGOT,SGPT,PHOS in the last 72 hours    No results found for this basename: PO2,PCO2,PH,HCO3,FIO2 in the last 72 hours  Lab Results   Component Value Date/Time    Sed rate (ESR) 78 01/15/2007  4:30 PM             Assessment:    Problem List Date Reviewed: 10/07/2008      Class    Iron deficiency [275.0C]     Overview     Post infed for endoscopy          Pleuritic chest pain [786.52D]         Factor V  deficiency [286.3AY] (Chronic)         Depression [311L] (Chronic)         Anxiety [300.00E] (Chronic)         Patient noncompliance [V15.81K] (Chronic)         Pulmonary embolism, 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

## 2008-10-07 NOTE — Progress Notes (Signed)
Pt co pain all over, requesting to have demerol, nursing suggest po percocet due to dc to home plans, agrees, percocet 1 tab po, iron continues to infuse at this time

## 2008-10-07 NOTE — Progress Notes (Signed)
Patient complains of pain and request IV demerol. Given prn. Will continue to monitor.

## 2008-10-07 NOTE — Progress Notes (Signed)
Patient resting quietly in bed. NAD noted. No pain noted. Will continue to monitor.

## 2008-10-07 NOTE — Progress Notes (Signed)
Patient resting quietly in bed. No complaints of pain noted.

## 2008-10-07 NOTE — Progress Notes (Signed)
Pt dced to home via ambulatory, life port deactivated and flushed per protocal, see summary

## 2008-10-07 NOTE — Progress Notes (Signed)
Hem- Onc Progress Note    Admit Date: 10/03/2008  Subjective/Chief Complaint   r sided abdominal pain   Infectious changes on ct scan  Post infed  Discharge today      Review of Systems    Objective:   Blood pressure 99/64, pulse 58, temperature 97.9 ??F (36.6 ??C), resp. rate 18, height 5\' 4"  (1.626 m), weight 149 lb (67.586 kg), last menstrual period 07/28/2004, SpO2 96.00%.    Temp (24hrs), Avg:97.4 ??F (36.3 ??C), Min:97 ??F (36.1 ??C), Max:97.9 ??F (36.6 ??C)      Intake/Output Summary (Last 24 hours) at 10/07/08 1423  Last data filed at 10/07/08 1200   Gross per 24 hour   Intake    600 ml   Output      0 ml   Net    600 ml       No results found for this or any previous visit (from the past 24 hour(s)).  No results found for this basename: AGAG,  XCMVPT                   Physical Exam:   Chest is clear.   Cardiovascular - Regular, Rate and Rhythm.  No extra heart sounds or murmurs.  Abdomen is soft. Liver and spleen are not enlarged.    No clubbing, cyanosis or scleral icterus.      Antiinfectives:     Current facility-administered medications   Medication Dose Route Frequency   ??? lorazepam (ATIVAN) injection 1 mg  1 mg IntraVENous ONCE   ??? famotidine (PF) (PEPCID) injection 20 mg  20 mg IntraVENous ON CALL TO OR   ??? duloxetine (CYMBALTA) capsule 60 mg  60 mg Oral DAILY   ??? clonazepam (KLONOPIN) tablet 0.5 mg  0.5 mg Oral TID   ??? oxycodone-acetaminophen (PERCOCET 10) 10-325 mg per tablet 1 Tab  1 Tab Oral Q4H PRN   ??? mupirocin (BACTROBAN) 2 % ointment    Nasal BID   ??? tramadol (ULTRAM) tablet 50 mg  50 mg Oral Q6H PRN   ??? meperidine (DEMEROL) injection 50 mg  50 mg IntraVENous Q8H PRN   ??? DISCONTD: iron dextran complex (INFED) 1,000 mg in 0.9% sodium chloride 500 mL ivpb  1,000 mg IntraVENous DAILY   ??? SALINE PERIPHERAL FLUSH Q8H 5 mL  5 mL InterCATHeter Q8H   ??? saline peripheral flush 5 mL  5 mL InterCATHeter PRN              Impression:     Patient Active Hospital Problem List:   Pulmonary embolism, Recurrent (10/03/2008)    Pleuritic chest pain (10/04/2008)    Factor V deficiency (10/04/2008)    Patient noncompliance (10/04/2008)    Iron deficiency (10/06/2008)  Post infed for endoscopy      Plan:   Colonoscopy   egd   Upper abdominal pain    Post infed  Seems like problem at this moment is not pe  Gi workup negative  Will follow up as out patient would continue arixtra    Dr. Thayer Ohm  [facility]  [facility]  [address]  [place] [address]  Office : [phone]  Fax : [phone]  Cell : [phone]

## 2008-10-07 NOTE — Progress Notes (Signed)
Patient complains of pain of 7 on scale. Given prn percocet per order. Will continue to monitor.

## 2008-10-07 NOTE — Progress Notes (Signed)
States pain better

## 2008-10-08 LAB — FACTOR V LEIDEN
FACTOR V LEIDEN,FVL: NEGATIVE
Factor V Leiden-PCR: NEGATIVE

## 2008-10-26 MED ADMIN — HYDROmorphone (PF) (DILAUDID) injection 1 mg: INTRAMUSCULAR | NDC 00409255201

## 2008-10-26 MED ADMIN — ondansetron (ZOFRAN ODT) tablet 8 mg: ORAL | NDC 62756035664

## 2008-10-26 MED FILL — ONDANSETRON 8 MG TAB, RAPID DISSOLVE: 8 mg | ORAL | Qty: 1

## 2008-10-26 NOTE — ED Notes (Signed)
I have reviewed discharge instructions with the patient.  The patient verbalized understanding.

## 2008-10-26 NOTE — ED Provider Notes (Signed)
HPI Comments: She is here today c/o her typical migraine that started Monday with n/v x2 today. Also, while she is here she asked if I could look at her port in right upper chest. She said she recently noticed a tube like structure coming out of the top of her port running under the skin up to right neck.     Patient is a 43 y.o. female presenting with migraine. [name]   Migraine   This is a new problem. Episode onset: 4 days. The problem occurs constantly. The problem has not changed since onset. The quality of the pain is described as sharp and throbbing. The pain is at a severity of 8/10. Associated symptoms include nausea and vomiting. Pertinent negatives include no anorexia, no fever, no malaise/fatigue, no chest pressure, no near-syncope, no orthopnea, no palpitations, no syncope, no shortness of breath, no weakness, no tingling, no dizziness and no visual change. Treatments tried: excedrin and imitrex. The treatment provided no relief.        Past Medical History   Diagnosis Date   ??? Pulmonary embolism Multiple episodes     The patient has underlying Factor V (Leiden) deficiency.   ??? Nephrolithiasis Multiple episodes     She has had multiple kidney stones that have passed sponatnaeously and that have required lithotripsy on at least one occasion          Past Surgical History   Procedure Date   ??? Cystoscopy ???   ??? Lithotripsy 2006   ??? Hx vascular access July, 2010     Power Port placed in [place], [place].   ??? Hx appendectomy ???   ??? Hx tonsillectomy ???   ??? Hx cesarean section ???   ??? Hx tubal ligation ???           No family history on file.     History   Social History   ??? Marital Status: Legally Separated     Spouse Name: N/A     Number of Children: N/A   ??? Years of Education: N/A   Occupational History   ??? Not on file.   Social History Main Topics   ??? Tobacco Use: Never   ??? Alcohol Use: No   ??? Drug Use: No   ??? Sexually Active: Yes -- Female partner(s)      Birth Control/ Protection: Surgical   Other Topics Concern   ??? Not on file   Social History Narrative   ??? No narrative on file           ALLERGIES: Codeine, Pcn, Prednisone, Ketorolac tromethamine, Shellfish, Iv dye, iodine containing, Influenza virus vaccine, Pneumovax 23, Morphine, Dilaudid, Demerol and Motrin      Review of Systems   Constitutional: Negative.  Negative for fever, chills and malaise/fatigue.   HENT: Negative for ear pain, congestion, sore throat, rhinorrhea, sneezing, drooling, mouth sores, trouble swallowing, neck pain, neck stiffness, voice change, postnasal drip and sinus pressure.    Eyes: Negative.  Negative for pain, redness and visual disturbance.   Respiratory: Negative.  Negative for cough, chest tightness and shortness of breath.    Cardiovascular: Negative.  Negative for chest pain, palpitations, orthopnea, leg swelling and syncope.   Gastrointestinal: Positive for nausea and vomiting. Negative for abdominal pain, diarrhea and constipation.   Genitourinary: Negative.  Negative for dysuria and hematuria.   Musculoskeletal: Negative.  Negative for myalgias and arthralgias.   Skin: Negative.  Negative for rash.  Neurological: Positive for headaches. Negative for dizziness, tingling, tremors, seizures, syncope, facial asymmetry, speech difficulty, weakness, light-headedness and numbness.   Psychiatric/Behavioral: Negative.    All other systems reviewed and are negative.        Filed Vitals:    10/26/2008  6:18 PM 10/26/2008  8:03 PM   BP: 138/65 120/57   Pulse: 97 84   Temp: 99.3 ??F (37.4 ??C)    Resp: 18 18   Height: 5\' 4"  (1.626 m)    Weight: 158 lb (71.668 kg)    SpO2: 98% 97%              Physical Exam   Nursing note and vitals reviewed.  Constitutional: She is oriented to person, place, and time. She appears well-developed and well-nourished. No distress.   HENT:   Head: Normocephalic and atraumatic.    Eyes: Conjunctivae and extraocular motions are normal. Pupils are equal, round, and reactive to light.   Neck: Normal range of motion. Neck supple.   Cardiovascular: Normal rate, regular rhythm, normal heart sounds and intact distal pulses.    Pulmonary/Chest: Effort normal and breath sounds normal. No respiratory distress.        Abdominal: Soft. Bowel sounds are normal. No tenderness.   Musculoskeletal: Normal range of motion.   Neurological: She is alert and oriented to person, place, and time. She has normal reflexes. No cranial nerve deficit. Coordination normal.   Skin: Skin is warm and dry. She is not diaphoretic.   Psychiatric: She has a normal mood and affect. Her behavior is normal.        MDM Coding   Reviewed: nursing note and vitals        Procedures    Labs normal. Her HA is mildly improved. I recommend she go home to a dark room and rest. F/U with her PMD tomorrow and the doctor who manages her port tomorrow

## 2008-10-26 NOTE — ED Notes (Signed)
I was personally available for consultation in the emergency department.  I have reviewed the chart and agree with the documentation recorded by the MLP, including the assessment, treatment plan, and disposition.  Mang Hazelrigg A Corbett III, MD

## 2008-10-26 NOTE — ED Notes (Signed)
I had no involvement in this patient's care or disposition.   This signature is to meet an administrative requirement.

## 2008-10-26 NOTE — ED Notes (Signed)
Pt with c/o right subclavian port pain and swelling.  Pt thinks catheter out of place, hard long tube looking area to right neck area present.

## 2008-10-27 LAB — D-DIMER, QUANTITATIVE: D-Dimer, Quant: <0.19 ug/ml(FEU) (ref <0.53)

## 2008-10-27 LAB — CBC WITH AUTOMATED DIFF
ABS. BASOPHILS: 0 10*3/uL (ref 0.0–0.2)
ABS. EOSINOPHILS: 0.3 10*3/uL (ref 0.0–0.8)
ABS. IMM. GRANS.: 0 10*3/uL (ref 0.0–2.0)
ABS. LYMPHOCYTES: 1.8 10*3/uL (ref 0.5–4.6)
ABS. MONOCYTES: 0.4 10*3/uL (ref 0.1–1.3)
ABS. NEUTROPHILS: 2.5 10*3/uL (ref 1.7–8.2)
BASOPHILS: 1 % (ref 0.0–2.0)
EOSINOPHILS: 6 % (ref 0.5–7.8)
HCT: 34.6 % — ABNORMAL LOW (ref 37.6–48.3)
HGB: 11.2 g/dL — ABNORMAL LOW (ref 11.7–15.0)
IMMATURE GRANULOCYTES: 0 % (ref 0.0–2.0)
LYMPHOCYTES: 36 % (ref 13–44)
MCH: 30.3 PG (ref 26.1–32.9)
MCHC: 32.4 g/dL (ref 31.4–35.0)
MCV: 93.5 FL (ref 79.6–97.8)
MONOCYTES: 8 % (ref 4.0–12.0)
MPV: 10.8 FL (ref 10.8–14.1)
NEUTROPHILS: 49 % (ref 43–78)
PLATELET: 201 10*3/uL (ref 140–440)
RBC: 3.7 M/uL — ABNORMAL LOW (ref 3.86–5.18)
RDW: 14.6 % (ref 11.9–14.6)
WBC: 5 10*3/uL (ref 4.0–10.5)

## 2008-10-27 LAB — PROTHROMBIN TIME + INR
INR: 1 (ref 0.9–1.2)
Prothrombin time: 10.2 SECS (ref 8.9–11.9)

## 2008-10-27 LAB — D DIMER: D DIMER: <0.19 ug/ml(FEU) (ref <0.53)

## 2008-10-27 LAB — PTT: aPTT: 22.8 s — ABNORMAL LOW (ref 25.3–32.9)

## 2008-12-19 LAB — D-DIMER, QUANTITATIVE: D-Dimer, Quant: 0.19 ug/ml(FEU) (ref <0.55)

## 2008-12-19 LAB — METABOLIC PANEL, COMPREHENSIVE
A-G Ratio: 1.3 (ref 1.2–3.5)
ALT (SGPT): 82 U/L — ABNORMAL HIGH (ref 39–65)
AST (SGOT): 49 U/L — ABNORMAL HIGH (ref 15–37)
Albumin: 4.2 g/dL (ref 3.5–5.0)
Alk. phosphatase: 197 U/L — ABNORMAL HIGH (ref 50–136)
Anion gap: 9 mmol/L (ref 7–16)
BUN: 13 MG/DL (ref 7–18)
Bilirubin, total: 0.2 MG/DL (ref 0.2–1.1)
CO2: 26 MMOL/L (ref 21–32)
Calcium: 9.7 MG/DL (ref 8.4–10.4)
Chloride: 103 MMOL/L (ref 98–107)
Creatinine: 0.9 MG/DL (ref 0.6–1.0)
GFR est AA: >60 mL/min/{1.73_m2} (ref >60)
GFR est non-AA: >60 mL/min/{1.73_m2} (ref >60)
Globulin: 3.2 g/dL (ref 2.3–3.5)
Glucose: 109 MG/DL — ABNORMAL HIGH (ref 74–106)
Potassium: 3.6 MMOL/L (ref 3.5–5.1)
Protein, total: 7.4 g/dL (ref 6.3–8.2)
Sodium: 138 MMOL/L (ref 136–145)

## 2008-12-19 LAB — CBC WITH AUTOMATED DIFF
ABS. BASOPHILS: 0 10*3/uL (ref 0.0–0.2)
ABS. EOSINOPHILS: 0.2 10*3/uL (ref 0.0–0.8)
ABS. IMM. GRANS.: 0 10*3/uL (ref 0.0–2.0)
ABS. LYMPHOCYTES: 1.6 10*3/uL (ref 0.5–4.6)
ABS. MONOCYTES: 0.6 10*3/uL (ref 0.1–1.3)
ABS. NEUTROPHILS: 3.3 10*3/uL (ref 1.7–8.2)
BASOPHILS: 0 % (ref 0.0–2.0)
EOSINOPHILS: 3 % (ref 0.5–7.8)
HCT: 34.8 % — ABNORMAL LOW (ref 37.6–48.3)
HGB: 11.7 g/dL (ref 11.7–15.0)
IMMATURE GRANULOCYTES: 0.2 % (ref 0.0–2.0)
LYMPHOCYTES: 29 % (ref 13–44)
MCH: 31.8 PG (ref 26.1–32.9)
MCHC: 33.6 g/dL (ref 31.4–35.0)
MCV: 94.6 FL (ref 79.6–97.8)
MONOCYTES: 10 % (ref 4.0–12.0)
MPV: 10.2 FL — ABNORMAL LOW (ref 10.8–14.1)
NEUTROPHILS: 58 % (ref 43–78)
PLATELET: 206 10*3/uL (ref 140–440)
RBC: 3.68 M/uL — ABNORMAL LOW (ref 3.86–5.18)
RDW: 14.9 % — ABNORMAL HIGH (ref 11.9–14.6)
WBC: 5.8 10*3/uL (ref 4.0–10.5)

## 2008-12-19 LAB — PTT: aPTT: 28 s (ref 23.5–31.7)

## 2008-12-19 LAB — PROTHROMBIN TIME + INR
INR: 1 (ref 0.9–1.2)
Prothrombin time: 10.6 SECS (ref 9.4–10.8)

## 2008-12-19 LAB — D DIMER: D DIMER: 0.19 ug/ml(FEU) (ref <0.55)

## 2008-12-19 MED ORDER — OXYCODONE-ACETAMINOPHEN 5 MG-325 MG TAB
5-325 mg | ORAL_TABLET | ORAL | Status: AC | PRN
Start: 2008-12-19 — End: 2008-12-26

## 2008-12-19 MED ORDER — CYCLOBENZAPRINE 10 MG TAB
10 mg | ORAL_TABLET | Freq: Three times a day (TID) | ORAL | Status: AC | PRN
Start: 2008-12-19 — End: 2008-12-29

## 2008-12-19 MED ADMIN — heparin (porcine) pf 100 unit/mL: @ 10:00:00

## 2008-12-19 MED ADMIN — HYDROmorphone (PF) (DILAUDID) injection 1 mg: INTRAVENOUS | @ 07:00:00 | NDC 00409255201

## 2008-12-19 MED ADMIN — cyclobenzaprine (FLEXERIL) tablet 10 mg: ORAL | @ 09:00:00 | NDC 51079064401

## 2008-12-19 MED ADMIN — oxycodone-acetaminophen (PERCOCET 10) 10-325 mg per tablet 1 Tab: ORAL | @ 09:00:00 | NDC 68084037811

## 2008-12-19 MED FILL — HYDROMORPHONE (PF) 1 MG/ML IJ SOLN: 1 mg/mL | INTRAMUSCULAR | Qty: 1

## 2008-12-19 MED FILL — HEPARIN LOCK FLUSH (PORCINE) (PF) 100 UNIT/ML INTRAVENOUS SYRINGE: 100 unit/mL | INTRAVENOUS | Qty: 6

## 2008-12-19 MED FILL — OXYCODONE-ACETAMINOPHEN 10 MG-325 MG TAB: 10-325 mg | ORAL | Qty: 1

## 2008-12-19 MED FILL — CYCLOBENZAPRINE 10 MG TAB: 10 mg | ORAL | Qty: 1

## 2008-12-19 NOTE — ED Notes (Signed)
Patient in lobby calling for a ride home.

## 2008-12-19 NOTE — ED Notes (Signed)
Patient back from VQ scan stated "The pain was a little better for a while but now is killing me."

## 2008-12-19 NOTE — ED Provider Notes (Signed)
Patient is a 43 y.o. female presenting with shortness of breath and back pain. [name]   Shortness of Breath  This is a recurrent problem. The average episode lasts 6 hours. The current episode started 6 to 12 hours ago. The problem has been gradually worsening. Associated symptoms include cough. Pertinent negatives include no fever, no headaches, no coryza, no rhinorrhea, no sore throat, no swollen glands, no ear pain, no neck pain, no sputum production, no hemoptysis, no wheezing, no PND, no orthopnea, no chest pain, no syncope, no vomiting, no abdominal pain, no rash, no leg pain, no leg swelling and no claudication. She has tried nothing for the symptoms. She has had prior hospitalizations. She has had prior ED visits. Associated medical issues include PE. Associated medical issues do not include asthma, COPD, pneumonia, chronic lung disease, CAD, heart failure, past MI, DVT or recent surgery.   Back Pain   This is a recurrent problem. The current episode started 6 to 12 hours ago. The problem has been gradually worsening. The problem occurs constantly. Patient reports no work related injury.The pain is associated with no known injury. The pain is present in the thoracic spine. The quality of the pain is described as stabbing and sharp. The pain does not radiate. The pain is at a severity of 7/10. The pain is moderate. Exacerbated by: deep breaths. Pertinent negatives include no chest pain, no fever, no numbness, no weight loss, no headaches, no abdominal pain, no abdominal swelling, no bowel incontinence, no perianal numbness, no bladder incontinence, no dysuria, no pelvic pain, no leg pain, no paresthesias, no paresis, no tingling and no weakness.        Past Medical History   Diagnosis Date   ??? Pulmonary embolism Multiple episodes     The patient has underlying Factor V (Leiden) deficiency.   ??? Nephrolithiasis Multiple episodes      She has had multiple kidney stones that have passed sponatnaeously and that have required lithotripsy on at least one occasion          Past Surgical History   Procedure Date   ??? Cystoscopy ???   ??? Lithotripsy 2006   ??? Hx vascular access July, 2010     Power Port placed in [place], [place].   ??? Hx appendectomy ???   ??? Hx tonsillectomy ???   ??? Hx cesarean section ???   ??? Hx tubal ligation ???           No family history on file.     History   Social History   ??? Marital Status: Legally Separated     Spouse Name: N/A     Number of Children: N/A   ??? Years of Education: N/A   Occupational History   ??? Not on file.   Social History Main Topics   ??? Tobacco Use: Never   ??? Alcohol Use: No   ??? Drug Use: No   ??? Sexually Active: Yes -- Female partner(s)     Birth Control/ Protection: Surgical   Other Topics Concern   ??? Not on file   Social History Narrative   ??? No narrative on file           ALLERGIES: Codeine, Pcn, Prednisone, Ketorolac tromethamine, Shellfish, Iv dye, iodine containing, Influenza virus vaccine, Pneumovax 23, Morphine, Dilaudid, Demerol and Motrin      Review of Systems   Constitutional: Negative.  Negative for fever and weight loss.   HENT:  Negative.  Negative for ear pain, sore throat, rhinorrhea and neck pain.    Eyes: Negative.    Respiratory: Positive for cough and shortness of breath. Negative for hemoptysis, sputum production and wheezing.    Cardiovascular: Negative for chest pain, orthopnea, claudication, leg swelling, syncope and PND.   Gastrointestinal: Negative for vomiting and abdominal pain.   Genitourinary: Negative.  Negative for bladder incontinence, dysuria and pelvic pain.   Musculoskeletal: Positive for back pain.   Skin: Negative.  Negative for rash.   Neurological: Negative.  Negative for tingling, weakness, numbness and headaches.   Hematological: Negative.    Psychiatric/Behavioral: The patient is nervous/anxious.    All other systems reviewed and are negative.         There were no vitals filed for this visit.         Physical Exam   Nursing note and vitals reviewed.  Constitutional: She is oriented to person, place, and time. She appears well-developed and well-nourished. No distress.   HENT:   Head: Normocephalic and atraumatic.   Mouth/Throat: Oropharynx is clear and moist.   Eyes: Conjunctivae and extraocular motions are normal. Pupils are equal, round, and reactive to light. Right eye exhibits no discharge. Left eye exhibits no discharge.   Neck: Normal range of motion. Neck supple. No JVD present. No tracheal deviation present.   Cardiovascular: Normal rate, regular rhythm, normal heart sounds and intact distal pulses.  Exam reveals no gallop and no friction rub.    No murmur heard.  Pulmonary/Chest: Effort normal and breath sounds normal. No stridor. No respiratory distress. She has no wheezes. She exhibits no tenderness.   Abdominal: Soft. Bowel sounds are normal. No tenderness. She has no rebound.   Musculoskeletal: Normal range of motion. She exhibits no edema and no tenderness.   Neurological: She is alert and oriented to person, place, and time. No cranial nerve deficit.   Skin: Skin is warm and dry. No rash noted. She is not diaphoretic. No erythema. No pallor.   Psychiatric: Her behavior is normal. Judgment and thought content normal.        MDM Coding   Reviewed: previous chart  Reviewed previous: labs  Interpretation: labs and x-ray (Nuc Med Scan)  Total time providing critical care: 30 minutes. This excludes time spent performing separately reportable procedures and services.        Procedures    Pt states this episode is similar to her previous PE's. Pt first experienced pain in the middle of her back, then she began having some slight shortness of breath. Pt is NOT tachypneic and her oxygen saturation is now 97% on room air.

## 2008-12-19 NOTE — ED Notes (Signed)
f °

## 2008-12-19 NOTE — ED Notes (Signed)
Nuclear medicine technician on the phone ready for patient.  Patient wanting medication before going for VQ scan.

## 2008-12-19 NOTE — ED Notes (Signed)
Pt has experienced multiple PE's that do not arise from DVT.

## 2008-12-19 NOTE — ED Notes (Signed)
Right subclavian power port accessed using sterile technique blood drawn and sent to the lab patient tolerated procedure well.       Patient complaint of pain level 8 out of 10.  Patient stated  "It feels like some one is stabbing me when i take deep breath."

## 2008-12-19 NOTE — ED Notes (Signed)
Discharge instructions given per order, voiced good understanding, denied any further questions.

## 2009-01-17 NOTE — ED Notes (Signed)
Patient states she called her PCP today and was told to take imitrex. States took imitrex without relief. Patient states she needs a shot to get rid of pain.

## 2009-01-18 MED ADMIN — diphenhydrAMINE (BENADRYL) injection 25 mg: INTRAVENOUS | @ 05:00:00 | NDC 00641037621

## 2009-01-18 MED ADMIN — valproate (DEPACON) 500 mg in 0.9% sodium chloride 50 mL ivpb: INTRAVENOUS | @ 04:00:00 | NDC 63323049405

## 2009-01-18 MED ADMIN — cyclobenzaprine (FLEXERIL) tablet 10 mg: ORAL | @ 05:00:00 | NDC 00591565801

## 2009-01-18 MED ADMIN — promethazine (PHENERGAN) injection 25 mg: INTRAVENOUS | @ 05:00:00 | NDC 00641092821

## 2009-01-18 MED ADMIN — sodium chloride 0.9 % bolus infusion 1,000 mL: INTRAVENOUS | @ 05:00:00 | NDC 00409798309

## 2009-01-18 MED FILL — CYCLOBENZAPRINE 10 MG TAB: 10 mg | ORAL | Qty: 1

## 2009-01-18 MED FILL — DIPHENHYDRAMINE HCL 50 MG/ML IJ SOLN: 50 mg/mL | INTRAMUSCULAR | Qty: 1

## 2009-01-18 MED FILL — PROMETHAZINE 25 MG/ML INJECTION: 25 mg/mL | INTRAMUSCULAR | Qty: 1

## 2009-01-18 MED FILL — VALPROATE SODIUM 100 MG/ML IV: 500 mg/5 mL (100 mg/mL) | INTRAVENOUS | Qty: 5

## 2009-01-18 NOTE — ED Notes (Signed)
I was available in the Emergency Department for consultation regarding the evaluation and/or disposition of this patient.   Hospital policy allows mid-level providers to function with clinical autonomy.   This is, therefore, merely an administrative signature as I had no direct involvement in this patient's care or disposition.

## 2009-01-18 NOTE — ED Provider Notes (Cosign Needed)
Patient is a 43 y.o. female presenting with migraine. [name]   Migraine   This is a new problem. The current episode started 12 to 24 hours ago. The problem occurs constantly. The problem has not changed since onset. The headache is aggravated by bright light. The pain is located in the left unilateral region. The quality of the pain is described as throbbing. The pain is at a severity of 8/10. Associated symptoms include nausea and vomiting. Pertinent negatives include no anorexia, no fever, no malaise/fatigue, no chest pressure, no near-syncope, no orthopnea, no palpitations, no syncope, no shortness of breath, no weakness, no tingling, no dizziness and no visual change. She has tried triptan therapy for the symptoms. The treatment provided no relief.        Past Medical History   Diagnosis Date   ??? 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 Nephrolithiasis Multiple episodes   ??? Anxiety    ??? Factor V Leiden, prothrombin gene mutation    ??? Other ill-defined conditions      factor 5, mult PEs   ??? Neurological disorder      migraines   ??? Chronic kidney disease      kidney stones   ??? Infectious disease      MRSA          Past Surgical History   Procedure Date   ??? Cystoscopy ???   ??? Lithotripsy 2006   ??? Hx vascular access July, 2010     Power Port placed in [place], [place].   ??? Hx appendectomy ???   ??? Hx tonsillectomy    ??? Hx urological      [id number]   ??? Hx cesarean section    ??? Hx tubal ligation            No family history on file.     History   Social History   ??? Marital Status: Legally Separated     Spouse Name: N/A     Number of Children: N/A   ??? Years of Education: N/A   Occupational History   ??? Not on file.   Social History Main Topics   ??? Tobacco Use: Never   ??? Alcohol Use: No   ??? Drug Use: No   ??? Sexually Active: Yes -- Female partner(s)     Birth Control/ Protection: Surgical   Other Topics Concern   ??? Not on file   Social History Narrative    ??? No narrative on file           ALLERGIES: Codeine, Pcn, Prednisone, Ketorolac tromethamine, Shellfish, Iv dye, iodine containing, Influenza virus vaccine, Pneumovax 23, Morphine and Motrin      Review of Systems   Constitutional: Negative.  Negative for fever, chills and malaise/fatigue.   HENT: Negative for ear pain, congestion, sore throat, rhinorrhea, sneezing, drooling, mouth sores, trouble swallowing, neck pain, neck stiffness, voice change, postnasal drip and sinus pressure.    Eyes: Positive for photophobia. Negative for pain, redness and visual disturbance.   Respiratory: Negative.  Negative for cough, chest tightness and shortness of breath.    Cardiovascular: Negative.  Negative for chest pain, palpitations, orthopnea, leg swelling and syncope.   Gastrointestinal: Positive for nausea and vomiting. Negative for abdominal pain, diarrhea and constipation.   Genitourinary: Negative.  Negative for dysuria and hematuria.   Musculoskeletal: Negative.  Negative for myalgias and arthralgias.   Skin:  Negative.  Negative for rash.   Neurological: Positive for headaches. Negative for dizziness, tingling, weakness and light-headedness.   Psychiatric/Behavioral: Negative.    All other systems reviewed and are negative.        Filed Vitals:    01/17/2009  9:29 PM 01/17/2009  9:32 PM   BP: 138/71    Pulse: 104    Temp:  98.4 ??F (36.9 ??C)   Resp: 20    Height: 5\' 4"  (1.626 m)    Weight: 161 lb (73.029 kg)    SpO2: 98%               Physical Exam   Nursing note and vitals reviewed.  Constitutional: She is oriented to person, place, and time. She appears well-developed and well-nourished. No distress.   HENT:   Head: Normocephalic and atraumatic.        Right Ear: External ear normal.   Left Ear: External ear normal.   Nose: Nose normal.   Mouth/Throat: Oropharynx is clear and moist.   Eyes: Conjunctivae and extraocular motions are normal. Pupils are equal, round, and reactive to light.    Neck: Normal range of motion. Neck supple.   Cardiovascular: Normal rate, regular rhythm, normal heart sounds and intact distal pulses.    Pulmonary/Chest: Effort normal and breath sounds normal. No respiratory distress.   Abdominal: Soft. Bowel sounds are normal. No tenderness.   Musculoskeletal: Normal range of motion.   Neurological: She is alert and oriented to person, place, and time. She has normal reflexes. No cranial nerve deficit. Coordination normal.   Skin: Skin is warm and dry. She is not diaphoretic.   Psychiatric: She has a normal mood and affect. Her behavior is normal.        MDM Coding   Reviewed: nursing note and vitals        Procedures    Headache resolved at discharge

## 2009-01-18 NOTE — ED Notes (Signed)
All previous venous accesses discontinued in [facility]. All one removed tonight was one placed before midnight.

## 2009-01-18 NOTE — ED Notes (Signed)
Patient discharged without distress. Instructions reviewed with patient and copy given with rx.  Patient verbalized understanding of instructions.

## 2009-01-18 NOTE — ED Notes (Signed)
Pt states relief of headache.

## 2009-02-08 NOTE — ED Notes (Signed)
Pt states she has a knot on upper stomach right side since yesterday with pain that goes under her rib cage.  Family at bedside, nad noted at this time

## 2009-02-08 NOTE — ED Notes (Signed)
Lab in room drawing blood

## 2009-02-08 NOTE — ED Provider Notes (Cosign Needed)
Patient is a 43 y.o. female presenting with abdominal pain. [name]   Abdominal Pain   This is a new problem. The current episode started yesterday. The problem occurs constantly. The problem has not changed since onset. The pain is associated with an unknown factor. The pain is located in the RUQ. The quality of the pain is aching. The pain is at a severity of 8/10. Associated symptoms include nausea. Pertinent negatives include no anorexia, no fever, no belching, no diarrhea, no flatus, no hematochezia, no melena, no vomiting, no constipation, no dysuria, no frequency, no hematuria, no headaches, no arthralgias, no myalgias, no trauma, no chest pain and no back pain. Nothing worsens the pain. The pain is relieved by nothing. Her past medical history is significant for kidney stones. The patient's surgical history includes appendectomy.hx c-sec, BTL       Past Medical History   Diagnosis Date   ??? 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 Nephrolithiasis Multiple episodes   ??? Anxiety    ??? Factor V Leiden, prothrombin gene mutation    ??? Other ill-defined conditions      factor 5, mult PEs   ??? Neurological disorder      migraines   ??? Chronic kidney disease      kidney stones   ??? Infectious disease      MRSA          Past Surgical History   Procedure Date   ??? Cystoscopy ???   ??? Lithotripsy 2006   ??? Hx vascular access July, 2010     Power Port placed in [place], [place].   ??? Hx appendectomy ???   ??? Hx tonsillectomy    ??? Hx urological      cystoscopex13   ??? Hx cesarean section    ??? Hx tubal ligation            No family history on file.     History   Social History   ??? Marital Status: Legally Separated     Spouse Name: N/A     Number of Children: N/A   ??? Years of Education: N/A   Occupational History   ??? Not on file.   Social History Main Topics   ??? Smoking status: Never Smoker    ??? Smokeless tobacco: Never Used   ??? Alcohol Use: No   ??? Drug Use: No    ??? Sexually Active: Yes -- Female partner(s)     Birth Control/ Protection: Surgical   Other Topics Concern   ??? Not on file   Social History Narrative   ??? No narrative on file           ALLERGIES: Codeine, Pcn, Prednisone, Ketorolac tromethamine, Shellfish, Iv dye, iodine containing, Influenza virus vaccine, Pneumovax 23, Morphine and Motrin      Review of Systems   Constitutional: Negative.  Negative for fever and chills.   HENT: Negative.  Negative for ear pain, congestion, sore throat, rhinorrhea, sneezing, drooling, mouth sores, trouble swallowing, neck pain, neck stiffness, voice change, postnasal drip and sinus pressure.    Eyes: Negative.  Negative for pain and redness.   Respiratory: Negative.  Negative for cough, chest tightness and shortness of breath.    Cardiovascular: Negative.  Negative for chest pain, palpitations and leg swelling.   Gastrointestinal: Positive for nausea and abdominal pain. Negative for vomiting, diarrhea, constipation, melena and hematochezia.  Genitourinary: Negative.  Negative for dysuria, frequency, hematuria, flank pain, decreased urine volume, vaginal bleeding, vaginal discharge, genital sore, vaginal pain, menstrual problem and pelvic pain.   Musculoskeletal: Negative.  Negative for myalgias, back pain and arthralgias.   Skin: Negative.  Negative for rash.   Neurological: Negative.  Negative for dizziness, light-headedness and headaches.   Psychiatric/Behavioral: Negative.    All other systems reviewed and are negative.        Filed Vitals:    02/08/2009  8:04 PM   BP: 131/70   Pulse: 74   Temp: 98.3 ??F (36.8 ??C)   Resp: 20   Height: 5\' 3"  (1.6 m)   Weight: 161 lb (73.029 kg)   SpO2: 99%              Physical Exam   Nursing note and vitals reviewed.  Constitutional: She is oriented to person, place, and time. She appears well-developed and well-nourished. No distress.   HENT:   Head: Normocephalic and atraumatic.   Eyes: Conjunctivae are normal.    Neck: Normal range of motion. Neck supple.   Cardiovascular: Normal rate, regular rhythm, normal heart sounds and intact distal pulses.    Pulmonary/Chest: Effort normal and breath sounds normal. No respiratory distress.   Abdominal: Soft. Bowel sounds are normal. There is no organomegaly. Tenderness is present in the right upper quadrant. She has no rigidity, no rebound, no guarding and no CVA tenderness.         Musculoskeletal: Normal range of motion.   Neurological: She is alert and oriented to person, place, and time.   Skin: Skin is warm and dry. She is not diaphoretic.   Psychiatric: She has a normal mood and affect. Her behavior is normal.        MDM Coding   Reviewed: nursing note and vitals  Interpretation: labs and ultrasound        Procedures    Labs unremarkable, US showed some sludge in gallbladder, o/w negative

## 2009-02-08 NOTE — ED Notes (Signed)
I have reviewed discharge instructions with the patient.  The patient verbalized understanding.

## 2009-02-09 LAB — METABOLIC PANEL, COMPREHENSIVE
A-G Ratio: 1.3 (ref 1.2–3.5)
ALT (SGPT): 46 U/L (ref 39–65)
AST (SGOT): 22 U/L (ref 15–37)
Albumin: 4.3 g/dL (ref 3.5–5.0)
Alk. phosphatase: 185 U/L — ABNORMAL HIGH (ref 50–136)
Anion gap: 8 mmol/L (ref 7–16)
BUN: 13 MG/DL (ref 7–18)
Bilirubin, total: 0.3 MG/DL (ref 0.2–1.1)
CO2: 27 MMOL/L (ref 21–32)
Calcium: 9.3 MG/DL (ref 8.4–10.4)
Chloride: 103 MMOL/L (ref 98–107)
Creatinine: 0.8 MG/DL (ref 0.6–1.0)
GFR est AA: >60 mL/min/{1.73_m2} (ref >60)
GFR est non-AA: >60 mL/min/{1.73_m2} (ref >60)
Globulin: 3.4 g/dL (ref 2.3–3.5)
Glucose: 94 MG/DL (ref 65–100)
Potassium: 4.2 MMOL/L (ref 3.5–5.1)
Protein, total: 7.7 g/dL (ref 6.3–8.2)
Sodium: 138 MMOL/L (ref 136–145)

## 2009-02-09 LAB — CBC WITH AUTOMATED DIFF
ABS. BASOPHILS: 0 10*3/uL (ref 0.0–0.2)
ABS. EOSINOPHILS: 0.3 10*3/uL (ref 0.0–0.8)
ABS. IMM. GRANS.: 0 10*3/uL (ref 0.0–2.0)
ABS. LYMPHOCYTES: 1.6 10*3/uL (ref 0.5–4.6)
ABS. MONOCYTES: 0.4 10*3/uL (ref 0.1–1.3)
ABS. NEUTROPHILS: 2.9 10*3/uL (ref 1.7–8.2)
BASOPHILS: 1 % (ref 0.0–2.0)
EOSINOPHILS: 5 % (ref 0.5–7.8)
HCT: 36.4 % — ABNORMAL LOW (ref 37.6–48.3)
HGB: 12.4 g/dL (ref 11.7–15.0)
IMMATURE GRANULOCYTES: 0.2 % (ref 0.0–2.0)
LYMPHOCYTES: 31 % (ref 13–44)
MCH: 32.6 PG (ref 26.1–32.9)
MCHC: 34.1 g/dL (ref 31.4–35.0)
MCV: 95.8 FL (ref 79.6–97.8)
MONOCYTES: 7 % (ref 4.0–12.0)
MPV: 9.9 FL — ABNORMAL LOW (ref 10.8–14.1)
NEUTROPHILS: 56 % (ref 43–78)
PLATELET: 271 10*3/uL (ref 140–440)
RBC: 3.8 M/uL — ABNORMAL LOW (ref 3.86–5.18)
RDW: 13 % (ref 11.9–14.6)
WBC: 5.1 10*3/uL (ref 4.0–10.5)

## 2009-02-09 LAB — LIPASE: Lipase: 200 U/L (ref 73–393)

## 2009-02-09 MED ORDER — TRAMADOL 50 MG TAB
50 mg | ORAL_TABLET | Freq: Four times a day (QID) | ORAL | Status: AC | PRN
Start: 2009-02-09 — End: 2009-02-18

## 2009-02-09 MED ADMIN — ondansetron (ZOFRAN ODT) tablet 8 mg: ORAL | @ 03:00:00 | NDC 62756035664

## 2009-02-09 MED FILL — ONDANSETRON 8 MG TAB, RAPID DISSOLVE: 8 mg | ORAL | Qty: 1

## 2009-02-17 LAB — URINE MICROSCOPIC
Casts: 0 /LPF
Crystals, urine: 0 /LPF

## 2009-02-17 LAB — METABOLIC PANEL, BASIC
Anion gap: 11 mmol/L (ref 7–16)
BUN: 24 MG/DL — ABNORMAL HIGH (ref 7–18)
CO2: 22 MMOL/L (ref 21–32)
Calcium: 9.8 MG/DL (ref 8.4–10.4)
Chloride: 103 MMOL/L (ref 98–107)
Creatinine: 0.8 MG/DL (ref 0.6–1.0)
GFR est AA: >60 mL/min/{1.73_m2} (ref >60)
GFR est non-AA: >60 mL/min/{1.73_m2} (ref >60)
Glucose: 97 MG/DL (ref 65–100)
Potassium: 3.8 MMOL/L (ref 3.5–5.1)
Sodium: 136 MMOL/L (ref 136–145)

## 2009-02-17 LAB — CBC W/O DIFF
HCT: 39.6 % (ref 37.6–48.3)
HGB: 13.8 g/dL (ref 11.7–15.0)
MCH: 33.8 PG — ABNORMAL HIGH (ref 26.1–32.9)
MCHC: 34.8 g/dL (ref 31.4–35.0)
MCV: 97.1 FL (ref 79.6–97.8)
MPV: 10.4 FL — ABNORMAL LOW (ref 10.8–14.1)
PLATELET: 187 10*3/uL (ref 140–440)
RBC: 4.08 M/uL (ref 3.86–5.18)
RDW: 12.3 % (ref 11.9–14.6)
WBC: 3.9 10*3/uL — ABNORMAL LOW (ref 4.0–10.5)

## 2009-02-17 LAB — HCG URINE, QL. - POC: Pregnancy test,urine (POC): NEGATIVE

## 2009-02-17 MED ADMIN — diphenhydrAMINE (BENADRYL) injection 25 mg: INTRAVENOUS | @ 18:00:00 | NDC 00641037621

## 2009-02-17 MED ADMIN — sodium chloride 0.9 % bolus infusion 1,000 mL: INTRAVENOUS | @ 16:00:00 | NDC 00409798309

## 2009-02-17 MED ADMIN — ondansetron (ZOFRAN) injection 4 mg: INTRAVENOUS | @ 16:00:00 | NDC 00143989105

## 2009-02-17 MED ADMIN — lorazepam (ATIVAN) injection 2 mg: INTRAVENOUS | @ 18:00:00 | NDC 10019010239

## 2009-02-17 MED ADMIN — meperidine (DEMEROL) injection 25 mg: INTRAVENOUS | @ 16:00:00 | NDC 10019015944

## 2009-02-17 MED FILL — ONDANSETRON (PF) 4 MG/2 ML INJECTION: 4 mg/2 mL | INTRAMUSCULAR | Qty: 2

## 2009-02-17 MED FILL — DEMEROL (PF) 25 MG/ML INJECTION SYRINGE: 25 mg/mL | INTRAMUSCULAR | Qty: 1

## 2009-02-17 MED FILL — LORAZEPAM 2 MG/ML IJ SOLN: 2 mg/mL | INTRAMUSCULAR | Qty: 1

## 2009-02-17 MED FILL — DIPHENHYDRAMINE HCL 50 MG/ML IJ SOLN: 50 mg/mL | INTRAMUSCULAR | Qty: 1

## 2009-02-17 NOTE — ED Notes (Signed)
I have reviewed discharge instructions with the patient.  The patient verbalized understanding.

## 2009-02-17 NOTE — ED Notes (Signed)
Pt calmer now-stopped scratching.

## 2009-02-17 NOTE — ED Notes (Signed)
Resting quietly-states feels better.

## 2009-02-17 NOTE — ED Notes (Signed)
Pt called on call bell-down to check on pt-crying-states "I'm hurting all over". Reassured that her labs are ok and don't show infection. She is holding her body rigid. Encouraged to relax and quit eating ice. Dr Sheffield Slider informed.

## 2009-02-17 NOTE — ED Notes (Signed)
Ativan given for anxiety-I was still in room getting pt drinks of water and she started scratching all over stated she was itching all over. Dr Sheffield Slider informed.

## 2009-02-17 NOTE — ED Notes (Signed)
Pt states left sided flank pain with frequent urination, thinks she has another kidney stone

## 2009-02-17 NOTE — ED Notes (Signed)
Rec'd report from [employer]. Pt currently in CT.

## 2009-02-17 NOTE — ED Notes (Signed)
Pt shaking all over-states "I did this last time my port was infected". "My port is infected". Dr Sheffield Slider into room. No redness noted at port site. Temp 98.6 ax.

## 2009-02-17 NOTE — ED Notes (Signed)
Med given for itching. Pt encouraged to not scratch. No rash or hives noted. Ativan added to allergy list.

## 2009-02-17 NOTE — ED Notes (Signed)
To Ct

## 2009-02-17 NOTE — ED Notes (Signed)
Patient stable to bathroom to give UA sample

## 2009-02-17 NOTE — ED Provider Notes (Signed)
HPI Comments: The patient presents to ed with left flank pain. Patient has hx of kidney stones. She has multiple ed visits.     Patient is a 44 y.o. female presenting with flank pain. [name] No language interpreter was used.   Flank Pain   This is a recurrent problem. The current episode started yesterday. The problem has been gradually worsening. The problem occurs constantly. The pain is associated with no known injury. The pain is present in the left side. The pain is at a severity of 8/10. The pain is the same all the time. Associated symptoms include abdominal pain and dysuria. She has tried nothing for the symptoms. Risk factors include history of kidney stones. patient reported surgery for last stone in NOV       Past Medical History   Diagnosis Date   ??? 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 Nephrolithiasis Multiple episodes   ??? Anxiety    ??? Factor V Leiden, prothrombin gene mutation    ??? Other ill-defined conditions      factor 5, mult PEs   ??? Neurological disorder      migraines   ??? Chronic kidney disease      kidney stones   ??? Infectious disease      MRSA          Past Surgical History   Procedure Date   ??? Cystoscopy ???   ??? Lithotripsy 2006   ??? Hx vascular access July, 2010     Power Port placed in [place], [place].   ??? Hx appendectomy ???   ??? Hx tonsillectomy    ??? Hx urological      cystoscopex13   ??? Hx cesarean section    ??? Hx tubal ligation            No family history on file.     History   Social History   ??? Marital Status: Legally Separated     Spouse Name: N/A     Number of Children: N/A   ??? Years of Education: N/A   Occupational History   ??? Not on file.   Social History Main Topics   ??? Smoking status: Never Smoker    ??? Smokeless tobacco: Never Used   ??? Alcohol Use: No   ??? Drug Use: No   ??? Sexually Active: Yes -- Female partner(s)     Birth Control/ Protection: Surgical   Other Topics Concern   ??? Not on file   Social History Narrative    ??? No narrative on file           ALLERGIES: Codeine, Pcn, Prednisone, Ketorolac tromethamine, Shellfish, Iv dye, iodine containing, Influenza virus vaccine, Pneumovax 23, Morphine and Motrin      Review of Systems   Gastrointestinal: Positive for abdominal pain.   Genitourinary: Positive for dysuria and flank pain.       Filed Vitals:    02/17/2009  9:56 AM   BP: 149/90   Pulse: 94   Temp: 97.9 ??F (36.6 ??C)   Resp: 18   Height: 5\' 4"  (1.626 m)   Weight: 151 lb (68.493 kg)   SpO2: 99%              Physical Exam     Coding    Procedures

## 2009-02-17 NOTE — ED Notes (Signed)
Patient having anxiety in room. Ct scan explained no obstructive stone. Patient went to bathroom before ct and claimed felt stone come out.

## 2009-02-23 LAB — CBC WITH AUTOMATED DIFF
ABS. BASOPHILS: 0 10*3/uL (ref 0.0–0.2)
ABS. EOSINOPHILS: 0.1 10*3/uL (ref 0.0–0.8)
ABS. IMM. GRANS.: 0 10*3/uL (ref 0.0–2.0)
ABS. LYMPHOCYTES: 1.4 10*3/uL (ref 0.5–4.6)
ABS. MONOCYTES: 0.5 10*3/uL (ref 0.1–1.3)
ABS. NEUTROPHILS: 4 10*3/uL (ref 1.7–8.2)
BASOPHILS: 0 % (ref 0.0–2.0)
EOSINOPHILS: 2 % (ref 0.5–7.8)
HCT: 35.1 % — ABNORMAL LOW (ref 37.6–48.3)
HGB: 12 g/dL (ref 11.7–15.0)
IMMATURE GRANULOCYTES: 0.5 % (ref 0.0–2.0)
LYMPHOCYTES: 23 % (ref 13–44)
MCH: 32.3 PG (ref 26.1–32.9)
MCHC: 34.2 g/dL (ref 31.4–35.0)
MCV: 94.6 FL (ref 79.6–97.8)
MONOCYTES: 9 % (ref 4.0–12.0)
MPV: 10.9 FL (ref 10.8–14.1)
NEUTROPHILS: 66 % (ref 43–78)
PLATELET: 161 10*3/uL (ref 140–440)
RBC: 3.71 M/uL — ABNORMAL LOW (ref 3.86–5.18)
RDW: 12 % (ref 11.9–14.6)
WBC: 6.1 10*3/uL (ref 4.0–10.5)

## 2009-02-23 LAB — PROTHROMBIN TIME + INR
INR: 1 (ref 0.9–1.2)
Prothrombin time: 10.2 SECS (ref 9.4–10.8)

## 2009-02-23 LAB — PTT: aPTT: 35 s — ABNORMAL HIGH (ref 23.5–31.7)

## 2009-02-23 MED ORDER — PROMETHAZINE 25 MG TAB
25 mg | ORAL_TABLET | Freq: Four times a day (QID) | ORAL | Status: AC | PRN
Start: 2009-02-23 — End: 2009-03-02

## 2009-02-23 MED ORDER — HYDROMORPHONE 4 MG TAB
4 mg | ORAL_TABLET | ORAL | Status: DC | PRN
Start: 2009-02-23 — End: 2009-03-19

## 2009-02-23 MED ADMIN — ondansetron (ZOFRAN) injection 4 mg: INTRAVENOUS | @ 22:00:00 | NDC 00143989105

## 2009-02-23 MED ADMIN — meperidine (DEMEROL) injection 25 mg: INTRAVENOUS | @ 18:00:00 | NDC 10019015944

## 2009-02-23 MED ADMIN — meperidine (DEMEROL) injection 25 mg: INTRAVENOUS | @ 20:00:00 | NDC 10019015944

## 2009-02-23 MED ADMIN — ondansetron (ZOFRAN) injection 4 mg: INTRAVENOUS | @ 18:00:00 | NDC 00143989105

## 2009-02-23 MED ADMIN — heparin (porcine) pf 300 Units: @ 23:00:00

## 2009-02-23 MED ADMIN — HYDROmorphone (PF) (DILAUDID) injection 1 mg: INTRAVENOUS | @ 22:00:00 | NDC 00409255201

## 2009-02-23 MED FILL — HEPARIN LOCK FLUSH (PORCINE) (PF) 100 UNIT/ML INTRAVENOUS SYRINGE: 100 unit/mL | INTRAVENOUS | Qty: 3

## 2009-02-23 MED FILL — ONDANSETRON (PF) 4 MG/2 ML INJECTION: 4 mg/2 mL | INTRAMUSCULAR | Qty: 2

## 2009-02-23 MED FILL — DEMEROL (PF) 25 MG/ML INJECTION SYRINGE: 25 mg/mL | INTRAMUSCULAR | Qty: 1

## 2009-02-23 MED FILL — HYDROMORPHONE (PF) 1 MG/ML IJ SOLN: 1 mg/mL | INTRAMUSCULAR | Qty: 1

## 2009-02-23 NOTE — ED Notes (Signed)
I have reviewed discharge instructions with the patient.  The patient verbalized understanding.

## 2009-02-23 NOTE — ED Notes (Signed)
ems report bgl 125 patient has port.

## 2009-02-23 NOTE — ED Notes (Signed)
vq scan does show evidence of new pe. She is on asterix and according to hospitalist no further treatment except for the pain meds are necessary.

## 2009-02-23 NOTE — ED Notes (Signed)
Pain is 8/10

## 2009-02-23 NOTE — ED Provider Notes (Addendum)
HPI Comments: 16 wf on asterix for multiple PE's and on pain meds for chronic chest pain. She states she has new pain in the chest like she has another pe. No change in sob or nausea.     Patient is a 44 y.o. female presenting with shortness of breath. [name]   Shortness of Breath  This is a new problem.        Past Medical History   Diagnosis Date   ??? 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 Nephrolithiasis Multiple episodes   ??? Anxiety    ??? Factor V Leiden, prothrombin gene mutation    ??? Other ill-defined conditions      factor 5, mult PEs   ??? Neurological disorder      migraines   ??? Chronic kidney disease      kidney stones   ??? Infectious disease      MRSA          Past Surgical History   Procedure Date   ??? Cystoscopy ???   ??? Lithotripsy 2006   ??? Hx vascular access July, 2010     Power Port placed in [place], [place].   ??? Hx appendectomy ???   ??? Hx tonsillectomy    ??? Hx urological      cystoscopex13   ??? Hx cesarean section    ??? Hx tubal ligation            No family history on file.     History   Social History   ??? Marital Status: Legally Separated     Spouse Name: N/A     Number of Children: N/A   ??? Years of Education: N/A   Occupational History   ??? Not on file.   Social History Main Topics   ??? Smoking status: Never Smoker    ??? Smokeless tobacco: Never Used   ??? Alcohol Use: No   ??? Drug Use: No   ??? Sexually Active: Yes -- Female partner(s)     Birth Control/ Protection: Surgical   Other Topics Concern   ??? Not on file   Social History Narrative   ??? No narrative on file           ALLERGIES: Codeine, Pcn, Prednisone, Ketorolac tromethamine, Shellfish, Iv dye, iodine containing, Influenza virus vaccine, Pneumovax 23, Morphine, Motrin and Ativan      Review of Systems   Respiratory: Positive for shortness of breath.    All other systems reviewed and are negative.        Filed Vitals:     02/23/2009 11:39 AM 02/23/2009  2:35 PM 02/23/2009  2:39 PM 02/23/2009  3:00 PM   BP: 153/70 119/83  119/75   Pulse: 100  97 104   Temp: 98.7 ??F (37.1 ??C)      Resp: 16      Height: 5\' 4"  (1.626 m)      Weight: 151 lb (68.493 kg)      SpO2: 95%  96% 90%              Physical Exam   Nursing note and vitals reviewed.  Constitutional: She is oriented to person, place, and time. She appears well-developed and well-nourished.   HENT:   Head: Normocephalic and atraumatic.   Right Ear: External ear normal.   Left Ear: External ear normal.   Nose: Nose normal.   Mouth/Throat: Oropharynx is  clear and moist.   Eyes: Conjunctivae and extraocular motions are normal. Pupils are equal, round, and reactive to light.   Neck: Normal range of motion. Neck supple.   Cardiovascular: Normal rate, regular rhythm, normal heart sounds and intact distal pulses.    Pulmonary/Chest: Effort normal and breath sounds normal. No respiratory distress. She has no wheezes. She has no rales. She exhibits tenderness.   Abdominal: Soft. Bowel sounds are normal. She exhibits no distension. No tenderness.   Musculoskeletal: Normal range of motion. She exhibits no edema and no tenderness.   Neurological: She is alert and oriented to person, place, and time.   Skin: Skin is warm and dry.        Coding    Procedures

## 2009-02-23 NOTE — ED Notes (Signed)
Patient report left lung pain and sob

## 2009-03-19 NOTE — ED Notes (Signed)
Patient presents with c/o migraine.  States she was at [facility] on Sunday and was given 1 mg dilaudid, benadryl and compazine with no relief.  She went back there today but decided not to stay when she was told it would be 6 hours.

## 2009-03-19 NOTE — ED Notes (Signed)
Note signed for administrative purposes only.  I was present in the department for consultation; however, I did not see this patient or have any involvement in the treatment or disposition of this pt.

## 2009-03-19 NOTE — ED Notes (Signed)
Migraine since Friday.  Seen at [facility] on Sunday and got shot, states she went back there today and was told a long wait.  Pt informed of wait.

## 2009-03-19 NOTE — ED Provider Notes (Cosign Needed)
Patient is a 44 y.o. female presenting with headache. [name]   Headache   This is a chronic problem. The problem occurs constantly. The problem has not changed since onset. The headache is aggravated by bright light. The pain is located in the left unilateral region. The quality of the pain is described as sharp, dull and throbbing. The pain is at a severity of 8/10. The pain is moderate. Pertinent negatives include no anorexia, no fever, no malaise/fatigue, no chest pressure, no near-syncope, no orthopnea, no palpitations, no syncope, no shortness of breath, no weakness, no tingling, no dizziness, no visual change, no nausea and no vomiting. She has tried triptan therapy (imetrex given from PMD, but not helping) for the symptoms. The treatment provided no relief.        Past Medical History   Diagnosis Date   ??? 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 Nephrolithiasis Multiple episodes   ??? Anxiety    ??? Factor V Leiden, prothrombin gene mutation    ??? Other ill-defined conditions      factor 5, mult PEs   ??? Neurological disorder      migraines   ??? Chronic kidney disease      kidney stones   ??? Infectious disease      MRSA          Past Surgical History   Procedure Date   ??? Cystoscopy ???   ??? Lithotripsy 2006   ??? Hx vascular access July, 2010     Power Port placed in [place], [place].   ??? Hx appendectomy ???   ??? Hx tonsillectomy    ??? Hx urological      cystoscopex13   ??? Hx cesarean section    ??? Hx tubal ligation            No family history on file.     History   Social History   ??? Marital Status: Legally Separated     Spouse Name: N/A     Number of Children: N/A   ??? Years of Education: N/A   Occupational History   ??? Not on file.   Social History Main Topics   ??? Smoking status: Never Smoker    ??? Smokeless tobacco: Never Used   ??? Alcohol Use: No   ??? Drug Use: No   ??? Sexually Active: Yes -- Female partner(s)     Birth Control/ Protection: Surgical    Other Topics Concern   ??? Not on file   Social History Narrative   ??? No narrative on file           ALLERGIES: Codeine, Pcn, Prednisone, Ketorolac tromethamine, Shellfish, Iv dye, iodine containing, Influenza virus vaccine, Pneumovax 23, Morphine, Motrin and Ativan      Review of Systems   Constitutional: Negative.  Negative for fever, chills and malaise/fatigue.   HENT: Negative for ear pain, congestion, sore throat, rhinorrhea, sneezing, drooling, mouth sores, trouble swallowing, neck pain, neck stiffness, voice change, postnasal drip and sinus pressure.    Eyes: Positive for photophobia. Negative for pain, redness and visual disturbance.   Respiratory: Negative.  Negative for cough, chest tightness and shortness of breath.    Cardiovascular: Negative.  Negative for chest pain, palpitations, orthopnea, leg swelling and syncope.   Gastrointestinal: Negative.  Negative for nausea, vomiting, abdominal pain, diarrhea and constipation.   Genitourinary: Negative.  Negative for dysuria and hematuria.  Musculoskeletal: Negative.  Negative for myalgias and arthralgias.   Skin: Negative.  Negative for rash.   Neurological: Positive for headaches. Negative for dizziness, tingling, tremors, seizures, syncope, facial asymmetry, speech difficulty, weakness, light-headedness and numbness.   Psychiatric/Behavioral: Negative.    All other systems reviewed and are negative.        Filed Vitals:    03/19/2009  6:06 PM   BP: 138/93   Pulse: 82   Temp: 97.2 ??F (36.2 ??C)   Resp: 17   Height: 5\' 4"  (1.626 m)   Weight: 159 lb (72.122 kg)   SpO2: 99%              Physical Exam   Nursing note and vitals reviewed.  Constitutional: She is oriented to person, place, and time. She appears well-developed and well-nourished. No distress.        She is white female in NAD, A&Ox4   HENT:   Head: Normocephalic and atraumatic.   Eyes: Conjunctivae and extraocular motions are normal. Pupils are equal, round, and reactive to light.    Neck: Normal range of motion. Neck supple.   Cardiovascular: Normal rate, regular rhythm, normal heart sounds and intact distal pulses.    Pulmonary/Chest: Effort normal and breath sounds normal. No respiratory distress.   Abdominal: Soft. Bowel sounds are normal. No tenderness.   Musculoskeletal: Normal range of motion.   Neurological: She is alert and oriented to person, place, and time. She has normal reflexes. No cranial nerve deficit. Coordination normal.   Skin: Skin is warm and dry. She is not diaphoretic.   Psychiatric: She has a normal mood and affect. Her behavior is normal.        MDM Coding   Reviewed: nursing note, vitals and previous chart        Procedures    She is well known to ED staff. I had a long discussion regarding my concern for her and the amount of narcotics she is and has been taking. 33 prescriptions from multiple providers, not to mention the narcotic shots that she is given in ED. I recommend she f/u with her PMD and request pain mngmt referral if they cannot control her many chronic pain complaints

## 2009-03-19 NOTE — ED Notes (Signed)
Patient left prior to discharge instructions.  Marked as eloped.

## 2009-04-19 NOTE — ED Provider Notes (Signed)
HPI Comments: The patient has multiple visits to ed with same complaint of pleuric chest pain. Per note by Cephus Shelling PA last month 33 pain medications in last six months. Known Pe but on arixtra.     Patient is a 44 y.o. female presenting with chest pain. [name] No language interpreter was used.   Chest Pain (Angina)   This is a chronic problem. The current episode started 6 to 12 hours ago. The problem has not changed since onset. The problem occurs constantly. The pain is associated with rest. The pain is present in the substernal region, right side, left side and lateral region. The pain is at a severity of 7/10. The quality of the pain is described as sharp and stabbing. The pain radiates to the mid back. The symptoms are aggravated by deep breathing and movement. Associated symptoms include back pain and shortness of breath. She has tried nothing for the symptoms. Risk factors: PE. VQ scan last monthshowed PE but on arixtra        Past Medical History   Diagnosis Date   ??? 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 Nephrolithiasis Multiple episodes   ??? Anxiety    ??? Factor V Leiden, prothrombin gene mutation    ??? Neurological disorder      migraines   ??? Chronic kidney disease      kidney stones   ??? Infectious disease      MRSA   ??? Other ill-defined conditions      factor 5, mult PEs          Past Surgical History   Procedure Date   ??? Cystoscopy ???   ??? Lithotripsy 2006   ??? Hx vascular access July, 2010     Power Port placed in [place], [place].   ??? Hx appendectomy ???   ??? Hx tonsillectomy    ??? Hx urological      cystoscopex13   ??? Hx cesarean section    ??? Hx tubal ligation            No family history on file.     History   Social History   ??? Marital Status: Divorced     Spouse Name: N/A     Number of Children: N/A   ??? Years of Education: N/A   Occupational History   ??? Not on file.   Social History Main Topics   ??? Smoking status: Never Smoker     ??? Smokeless tobacco: Never Used   ??? Alcohol Use: No   ??? Drug Use: No   ??? Sexually Active: Yes -- Female partner(s)     Birth Control/ Protection: Surgical   Other Topics Concern   ??? Not on file   Social History Narrative   ??? No narrative on file           ALLERGIES: Codeine, Pcn, Prednisone, Ketorolac tromethamine, Shellfish, Iv dye, iodine containing, Influenza virus vaccine, Pneumovax 23, Morphine, Motrin and Ativan      Review of Systems   Respiratory: Positive for shortness of breath.    Cardiovascular: Positive for chest pain.   Musculoskeletal: Positive for back pain.   All other systems reviewed and are negative.        Filed Vitals:    04/19/09 1921   BP: 113/84   Pulse: 111   Temp: 97.7 ??F (36.5 ??C)   Resp: 18  Height: 5\' 4"  (1.626 m)   Weight: 155 lb (70.308 kg)   SpO2: 94%              Physical Exam   Nursing note and vitals reviewed.  Constitutional: She is oriented to person, place, and time. She appears well-developed and well-nourished. No distress.   HENT:   Head: Normocephalic and atraumatic.   Right Ear: External ear normal.   Left Ear: External ear normal.   Nose: Nose normal.   Mouth/Throat: Oropharynx is clear and moist.   Eyes: Conjunctivae and extraocular motions are normal. Pupils are equal, round, and reactive to light.   Neck: Normal range of motion. Neck supple.   Cardiovascular: Normal rate, regular rhythm, normal heart sounds and intact distal pulses.    Pulmonary/Chest: Effort normal and breath sounds normal. No respiratory distress. She has no wheezes.   Abdominal: Soft. Bowel sounds are normal.   Musculoskeletal: Normal range of motion. She exhibits no edema and no tenderness.   Neurological: She is alert and oriented to person, place, and time. No cranial nerve deficit. She exhibits normal muscle tone.   Skin: Skin is warm and dry.   Psychiatric: She has a normal mood and affect. Her behavior is normal. Judgment and thought content normal.        MDM Coding    Reviewed: nursing note, vitals and previous chart  Reviewed previous: labs        Procedures

## 2009-04-19 NOTE — ED Notes (Signed)
Port flushed with heparin per protocol. I have reviewed discharge instructions with the patient.  The patient verbalized understanding.  Prescription given.  Patient ambulated out with no acute distress noted.

## 2009-04-19 NOTE — ED Notes (Signed)
Accessed port on rights upper chest using sterile technique.  Flushes well with good blood return.

## 2009-04-19 NOTE — ED Notes (Signed)
I discussed the patient's labs and treatment. I informed her that according to her last chart she has multiple narcotic prescriptions. i would not be using any scheduled medicatons. She told me she can take ultram.

## 2009-04-20 LAB — D-DIMER, QUANTITATIVE: D-Dimer, Quant: 0.19 ug/ml(FEU) (ref <0.55)

## 2009-04-20 LAB — METABOLIC PANEL, COMPREHENSIVE
A-G Ratio: 1.1 — ABNORMAL LOW (ref 1.2–3.5)
ALT (SGPT): 50 U/L (ref 39–65)
AST (SGOT): 45 U/L — ABNORMAL HIGH (ref 15–37)
Albumin: 3.8 g/dL (ref 3.5–5.0)
Alk. phosphatase: 127 U/L (ref 50–136)
Anion gap: 7 mmol/L (ref 7–16)
BUN: 17 MG/DL (ref 7–18)
Bilirubin, total: 0.2 MG/DL (ref 0.2–1.1)
CO2: 27 MMOL/L (ref 21–32)
Calcium: 9.3 MG/DL (ref 8.4–10.4)
Chloride: 104 MMOL/L (ref 98–107)
Creatinine: 0.8 MG/DL (ref 0.6–1.0)
GFR est AA: >60 mL/min/{1.73_m2} (ref >60)
GFR est non-AA: >60 mL/min/{1.73_m2} (ref >60)
Globulin: 3.4 g/dL (ref 2.3–3.5)
Glucose: 110 MG/DL — ABNORMAL HIGH (ref 65–100)
Potassium: 3.6 MMOL/L (ref 3.5–5.1)
Protein, total: 7.2 g/dL (ref 6.3–8.2)
Sodium: 138 MMOL/L (ref 136–145)

## 2009-04-20 LAB — EKG, 12 LEAD, INITIAL
Atrial Rate: 102 {beats}/min
Calculated P Axis: 55 degrees
Calculated R Axis: 85 degrees
Calculated T Axis: 52 degrees
P-R Interval: 164 ms
Q-T Interval: 354 ms
QRS Duration: 82 ms
QTC Calculation (Bezet): 461 ms
Ventricular Rate: 102 {beats}/min

## 2009-04-20 LAB — CBC W/O DIFF
HCT: 34 % — ABNORMAL LOW (ref 37.6–48.3)
HGB: 11.3 g/dL — ABNORMAL LOW (ref 11.7–15.0)
MCH: 32.4 PG (ref 26.1–32.9)
MCHC: 33.2 g/dL (ref 31.4–35.0)
MCV: 97.4 FL (ref 79.6–97.8)
MPV: 10.3 FL — ABNORMAL LOW (ref 10.8–14.1)
PLATELET: 192 10*3/uL (ref 140–440)
RBC: 3.49 M/uL — ABNORMAL LOW (ref 3.86–5.18)
RDW: 12.6 % (ref 11.9–14.6)
WBC: 4.8 10*3/uL (ref 4.0–10.5)

## 2009-04-20 LAB — D DIMER: D DIMER: 0.19 ug/ml(FEU) (ref <0.55)

## 2009-04-20 LAB — POC CARDIAC MARKERS
CK-MB: <1 ng/mL (ref 0.0–8.0)
Myoglobin: 28 ng/mL (ref 0–170)
Troponin-I: <0.05 ng/mL (ref 0.00–0.30)

## 2009-04-20 MED ORDER — TRAMADOL 50 MG TAB
50 mg | ORAL_TABLET | Freq: Four times a day (QID) | ORAL | Status: AC | PRN
Start: 2009-04-20 — End: 2009-04-29

## 2009-04-20 MED ADMIN — tramadol (ULTRAM) tablet 100 mg: ORAL | @ 02:00:00 | NDC 51079099101

## 2009-04-20 MED FILL — TRAMADOL 50 MG TAB: 50 mg | ORAL | Qty: 2

## 2009-04-20 MED FILL — HEPARIN LOCK FLUSH (PORCINE) (PF) 100 UNIT/ML INTRAVENOUS SYRINGE: 100 unit/mL | INTRAVENOUS | Qty: 3

## 2009-05-05 NOTE — ED Provider Notes (Signed)
Patient is a 44 y.o. female presenting with flank pain. [name] No language interpreter was used.   Flank Pain   This is a new problem. The current episode started more than 2 days ago. The problem has been gradually worsening. The problem occurs constantly. Patient reports no work related injury.The pain is associated with no known injury. The pain is present in the left side. The pain does not radiate. The pain is at a severity of 10/10. The pain is severe. Pertinent negatives include no chest pain, no fever, no numbness, no weight loss, no headaches, no abdominal pain, no abdominal swelling, no bowel incontinence, no perianal numbness, no bladder incontinence, no dysuria, no pelvic pain, no leg pain, no paresthesias, no paresis, no tingling and no weakness. She has tried analgesics (Tylenol) for the symptoms. Risk factors include history of kidney stones.        Past Medical History   Diagnosis Date   ??? 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 Nephrolithiasis Multiple episodes   ??? Anxiety    ??? Factor V Leiden, prothrombin gene mutation    ??? Neurological disorder      migraines   ??? Chronic kidney disease      kidney stones   ??? Infectious disease      MRSA   ??? Other ill-defined conditions      factor 5, mult PEs          Past Surgical History   Procedure Date   ??? Cystoscopy ???   ??? Lithotripsy 2006   ??? Hx vascular access July, 2010     Power Port placed in [place], [place].   ??? Hx appendectomy ???   ??? Hx tonsillectomy    ??? Hx urological      cystoscopex13   ??? Hx cesarean section    ??? Hx tubal ligation            No family history on file.     History   Social History   ??? Marital Status: Divorced     Spouse Name: N/A     Number of Children: N/A   ??? Years of Education: N/A   Occupational History   ??? Not on file.   Social History Main Topics   ??? Smoking status: Never Smoker    ??? Smokeless tobacco: Never Used   ??? Alcohol Use: No   ??? Drug Use: No    ??? Sexually Active: Yes -- Female partner(s)     Birth Control/ Protection: Surgical   Other Topics Concern   ??? Not on file   Social History Narrative   ??? No narrative on file           ALLERGIES: Codeine, Pcn, Prednisone, Ketorolac tromethamine, Shellfish, Iv dye, iodine containing, Influenza virus vaccine, Pneumovax 23, Morphine, Motrin and Ativan      Review of Systems   Constitutional: Negative for fever and weight loss.   Cardiovascular: Negative for chest pain.   Gastrointestinal: Negative for abdominal pain.   Genitourinary: Positive for flank pain. Negative for bladder incontinence, dysuria and pelvic pain.   Neurological: Negative for tingling, weakness, numbness and headaches.   All other systems reviewed and are negative.        Filed Vitals:    05/05/09 1854   BP: 140/104   Pulse: 103   Resp: 20   Height: 5\' 4"  (1.626 m)  Weight: 160 lb (72.576 kg)   SpO2: 96%              Physical Exam   Nursing note and vitals reviewed.  Constitutional: She is oriented to person, place, and time. She appears well-developed and well-nourished. No distress.   HENT:   Head: Normocephalic and atraumatic.   Right Ear: External ear normal.   Left Ear: External ear normal.   Nose: Nose normal.   Mouth/Throat: Oropharynx is clear and moist. No oropharyngeal exudate.   Eyes: Conjunctivae and extraocular motions are normal. Pupils are equal, round, and reactive to light. Right eye exhibits no discharge. Left eye exhibits no discharge. No scleral icterus.   Neck: Normal range of motion. Neck supple. No JVD present. No tracheal deviation present. No thyromegaly present.   Cardiovascular: Normal rate, regular rhythm and normal heart sounds.  Exam reveals no gallop and no friction rub.    No murmur heard.  Pulmonary/Chest: Effort normal and breath sounds normal. No stridor. No respiratory distress. She has no wheezes. She has no rales. She exhibits no tenderness.    Abdominal: Soft. Bowel sounds are normal. She exhibits no distension and no mass. No tenderness. She has no rebound and no guarding.         Musculoskeletal: Normal range of motion. She exhibits no edema and no tenderness.   Lymphadenopathy:     She has no cervical adenopathy.   Neurological: She is alert and oriented to person, place, and time. She displays normal reflexes. No cranial nerve deficit. She exhibits normal muscle tone. Coordination normal.   Skin: Skin is warm and dry. No rash noted. She is not diaphoretic. No erythema. No pallor.   Psychiatric: She has a normal mood and affect. Her behavior is normal.        MDM    Procedures    The patient was observed in the ED.    Results Reviewed:  CT UROGRAM - NO LEFT STONES PRESENT, POSSIBLY A RECENTLY PASSED STONE      Recent Results (from the past 24 hour(s))   URINE MICROSCOPIC    Collection Time    05/05/09  6:57 PM   Component Value Range   ??? WBC 20-50  0 (/HPF)   ??? RBC 20-50  0 (/HPF)   ??? Epithelial cells 3-5  0 (/HPF)   ??? Bacteria 1+ (*) 0 (/HPF)   ??? Casts 0  0 (/LPF)   ??? Crystals 0  0 (/LPF)   ??? Mucus 0  0 (/LPF)         I discussed the results of all labs, procedures, radiographs, and treatments with the patient.  Treatment plan is agreed upon and the patient is ready for discharge.  All voiced understanding of the discharge plan and medication instructions or changes as appropriate.  Questions about treatment in the ED were answered.  All were encouraged to return should symptoms worsen or new problems develop.

## 2009-05-05 NOTE — ED Notes (Signed)
I have reviewed medications, follow up provider options, and discharge instructions with the patient. The patient verbalized understanding. Copy of discharge information given to patient upon discharge. Prescription(s) given to patient. Patient discharged ambulatory in no distress.

## 2009-05-06 LAB — URINE MICROSCOPIC
Casts: 0 /LPF
Crystals, urine: 0 /LPF
Mucus: 0 /LPF

## 2009-05-06 MED ORDER — PHENAZOPYRIDINE 200 MG TAB
200 mg | ORAL_TABLET | Freq: Three times a day (TID) | ORAL | Status: AC
Start: 2009-05-06 — End: 2009-05-07

## 2009-05-06 MED ORDER — CIPROFLOXACIN 500 MG TAB
500 mg | ORAL_TABLET | Freq: Two times a day (BID) | ORAL | Status: AC
Start: 2009-05-06 — End: 2009-05-12

## 2009-05-06 MED ADMIN — promethazine (PHENERGAN) injection 12.5 mg: INTRAVENOUS | @ 01:00:00 | NDC 00641092821

## 2009-05-06 MED ADMIN — heparin (porcine) pf 100 unit/mL: @ 02:00:00

## 2009-05-06 MED ADMIN — phenazopyridine (PYRIDIUM) tablet 200 mg: ORAL | @ 02:00:00 | NDC 68084029311

## 2009-05-06 MED ADMIN — ciprofloxacin (CIPRO) tablet 500 mg: ORAL | @ 02:00:00 | NDC 51079040301

## 2009-05-06 MED ADMIN — sodium chloride 0.9 % bolus infusion 1,000 mL: INTRAVENOUS | @ 01:00:00 | NDC 00409798309

## 2009-05-06 MED FILL — PROMETHAZINE 25 MG/ML INJECTION: 25 mg/mL | INTRAMUSCULAR | Qty: 1

## 2009-05-06 MED FILL — CIPROFLOXACIN 500 MG TAB: 500 mg | ORAL | Qty: 1

## 2009-05-06 MED FILL — HEPARIN LOCK FLUSH (PORCINE) (PF) 100 UNIT/ML INTRAVENOUS SYRINGE: 100 unit/mL | INTRAVENOUS | Qty: 3

## 2009-05-06 MED FILL — PHENAZOPYRIDINE 200 MG TAB: 200 mg | ORAL | Qty: 1

## 2009-05-22 LAB — D-DIMER, QUANTITATIVE: D-Dimer, Quant: 0.63 ug/ml(FEU) — ABNORMAL HIGH (ref <0.55)

## 2009-05-22 LAB — PROTHROMBIN TIME + INR
INR: 6.3 — CR (ref 0.9–1.2)
Prothrombin time: 60.1 SECS — ABNORMAL HIGH (ref 9.4–10.8)

## 2009-05-22 LAB — D DIMER: D DIMER: 0.63 ug/ml(FEU) — ABNORMAL HIGH (ref <0.55)

## 2009-05-22 NOTE — ED Notes (Signed)
Pt declines peripheral draw, requests her port for venous access. Aware I need a room to access and this will cause a delay. Pt agreeable.

## 2009-05-22 NOTE — ED Notes (Signed)
Lab called with a critical of INR of 6.3. Dr Sheffield Slider aware

## 2009-05-22 NOTE — ED Notes (Signed)
I have reviewed discharge instructions with the patient.  The patient verbalized understanding.

## 2009-05-22 NOTE — ED Provider Notes (Signed)
HPI Comments: The patient presents to ed with complaint of lower back pain, rib, and deep breathing. She states that when her low back hurts she has a blood clot. Admitted four months ago at [facility] for clots. She was started on coumadin 11 days ago by her pcp and taken off of atrixis. Patient has been to the ed multiple times for pain related complaints.     Patient is a 44 y.o. female presenting with back pain. [name] No language interpreter was used.   Back Pain   This is a chronic problem. The current episode started yesterday. The problem has not changed since onset. The problem occurs constantly. Patient reports no work related injury.The pain is associated with no known injury. The pain is present in the right side and left side. The quality of the pain is described as cramping, burning and sharp. The pain does not radiate. The pain is at a severity of 8/10. The pain is the same all the time. She has tried nothing for the symptoms. Risk factors: factor V.        Past Medical History   Diagnosis Date   ??? 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 Nephrolithiasis Multiple episodes   ??? Anxiety    ??? Factor V Leiden, prothrombin gene mutation    ??? Neurological disorder      migraines   ??? Chronic kidney disease      kidney stones   ??? Infectious disease      MRSA   ??? Other ill-defined conditions      factor 5, mult PEs          Past Surgical History   Procedure Date   ??? Cystoscopy ???   ??? Lithotripsy 2006   ??? Hx vascular access July, 2010     Power Port placed in [place], [place].   ??? Hx appendectomy ???   ??? Hx tonsillectomy    ??? Hx urological      cystoscopex13   ??? Hx cesarean section    ??? Hx tubal ligation            No family history on file.     History   Social History   ??? Marital Status: Divorced     Spouse Name: N/A     Number of Children: N/A   ??? Years of Education: N/A   Occupational History   ??? Not on file.   Social History Main Topics    ??? Smoking status: Never Smoker    ??? Smokeless tobacco: Never Used   ??? Alcohol Use: No   ??? Drug Use: No   ??? Sexually Active: Yes -- Female partner(s)     Birth Control/ Protection: Surgical   Other Topics Concern   ??? Not on file   Social History Narrative   ??? No narrative on file           ALLERGIES: Codeine, Pcn, Prednisone, Ketorolac tromethamine, Shellfish, Iv dye, iodine containing, Influenza virus vaccine, Pneumovax 23, Morphine, Motrin and Ativan      Review of Systems   Musculoskeletal: Positive for back pain.   All other systems reviewed and are negative.        Filed Vitals:    05/22/09 1432   BP: 119/81   Pulse: 98   Temp: 98.1 ??F (36.7 ??C)   Resp: 18   Height: 5\' 4"  (1.626 m)  Weight: 157 lb (71.215 kg)   SpO2: 99%              Physical Exam   Nursing note and vitals reviewed.  Constitutional: She is oriented to person, place, and time. She appears well-developed and well-nourished.   HENT:   Head: Normocephalic and atraumatic.   Right Ear: External ear normal.   Left Ear: External ear normal.   Nose: Nose normal.   Mouth/Throat: Oropharynx is clear and moist.   Eyes: Conjunctivae and extraocular motions are normal. Pupils are equal, round, and reactive to light.   Neck: Normal range of motion. Neck supple.   Cardiovascular: Normal rate, regular rhythm, normal heart sounds and intact distal pulses.    Pulmonary/Chest: Effort normal and breath sounds normal. No respiratory distress. She has no wheezes.   Abdominal: Soft. Bowel sounds are normal.   Musculoskeletal: Normal range of motion. She exhibits no edema and no tenderness.   Neurological: She is alert and oriented to person, place, and time. No cranial nerve deficit. She exhibits normal muscle tone.   Skin: Skin is warm and dry.   Psychiatric: She has a normal mood and affect. Her behavior is normal. Judgment and thought content normal.        MDM    Procedures

## 2009-05-22 NOTE — ED Notes (Signed)
The patient and I discussed her INR at 6.3. Treatment plan - hold coumadin tonight and tomorrow. [facility] horizon to check level on Thursday. She has an appt on Friday.

## 2009-06-21 MED ORDER — TRAMADOL 50 MG TAB
50 mg | ORAL_TABLET | Freq: Four times a day (QID) | ORAL | Status: DC | PRN
Start: 2009-06-21 — End: 2009-08-15

## 2009-06-21 NOTE — ED Provider Notes (Cosign Needed)
Patient is a 44 y.o. female presenting with dental injury. [name]   Dental Injury   This is a new problem. The current episode started 3 to 5 hours ago. The problem occurs constantly. The problem has not changed since onset.  The pain is at a severity of 8/10. The pain is moderate. There was no vomiting, no nausea, no fever, no swelling, no chest pain, no shortness of breath, no headaches, no gum redness and no drainage. Treatments tried: lortab, but she took her last one. The treatment provided mild relief.        Past Medical History   Diagnosis Date   ??? 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 Nephrolithiasis Multiple episodes   ??? Anxiety    ??? Factor V Leiden, prothrombin gene mutation    ??? Neurological disorder      migraines   ??? Chronic kidney disease      kidney stones   ??? Infectious disease      MRSA   ??? Other ill-defined conditions      factor 5, mult PEs          Past Surgical History   Procedure Date   ??? Cystoscopy ???   ??? Lithotripsy 2006   ??? Hx vascular access July, 2010     Power Port placed in [place], [place].   ??? Hx appendectomy ???   ??? Hx tonsillectomy    ??? Hx urological      cystoscopex13   ??? Hx cesarean section    ??? Hx tubal ligation            No family history on file.     History   Social History   ??? Marital Status: Divorced     Spouse Name: N/A     Number of Children: N/A   ??? Years of Education: N/A   Occupational History   ??? Not on file.   Social History Main Topics   ??? Smoking status: Never Smoker    ??? Smokeless tobacco: Never Used   ??? Alcohol Use: No   ??? Drug Use: No   ??? Sexually Active: Yes -- Female partner(s)     Birth Control/ Protection: Surgical   Other Topics Concern   ??? Not on file   Social History Narrative   ??? No narrative on file                    ALLERGIES: Codeine, Pcn, Prednisone, Ketorolac tromethamine, Shellfish, Iv dye, iodine containing, Influenza virus vaccine, Pneumovax 23, Morphine, Motrin and Ativan       Review of Systems   Constitutional: Negative.  Negative for fever and chills.   HENT: Positive for dental problem. Negative for ear pain, congestion, sore throat, rhinorrhea, sneezing, drooling, mouth sores, trouble swallowing, neck pain, neck stiffness, voice change, postnasal drip and sinus pressure.    Eyes: Negative.  Negative for pain and redness.   Respiratory: Negative.  Negative for cough, chest tightness and shortness of breath.    Cardiovascular: Negative.  Negative for chest pain, palpitations and leg swelling.   Gastrointestinal: Negative.  Negative for nausea, vomiting, abdominal pain, diarrhea and constipation.   Genitourinary: Negative.  Negative for dysuria and hematuria.   Musculoskeletal: Negative.  Negative for myalgias and arthralgias.   Skin: Negative.  Negative for rash.   Neurological: Negative.  Negative for dizziness, light-headedness and headaches.  Psychiatric/Behavioral: Negative.    All other systems reviewed and are negative.        Filed Vitals:    06/21/09 1639   BP: 140/86   Pulse: 84   Temp: 98.4 ??F (36.9 ??C)   Resp: 18   Height: 5' 4.5" (1.638 m)   Weight: 164 lb (74.39 kg)   SpO2: 95%              Physical Exam   Nursing note and vitals reviewed.  Constitutional: She is oriented to person, place, and time. She appears well-developed and well-nourished. No distress.   HENT:   Head: Normocephalic and atraumatic.   Mouth/Throat:        Eyes: Conjunctivae are normal.   Neck: Normal range of motion. Neck supple.   Cardiovascular: Normal rate, regular rhythm, normal heart sounds and intact distal pulses.    Pulmonary/Chest: Effort normal and breath sounds normal. No respiratory distress.   Abdominal: Soft. Bowel sounds are normal. No tenderness.   Musculoskeletal: Normal range of motion.   Neurological: She is alert and oriented to person, place, and time.   Skin: Skin is warm and dry. She is not diaphoretic.   Psychiatric: She has a normal mood and affect. Her behavior is normal.         MDM     Amount and/or Complexity of Data Reviewed:   Discussion of test results with the performing providers:  No   Decide to obtain previous medical records or to obtain history from someone other than the patient:  No   Obtain history from someone other than the patient:  No   Review and summarize past medical records:  No   Discuss the patient with another provider:  No   Independant visualization of image, tracing, or specimen:  No  Risk of Significant Complications, Morbidity, and/or Mortality:   Presenting problems:  Minimal  Diagnostic procedures:  Minimal  Management options:  Minimal  Progress:   Patient progress:  Stable      Procedures

## 2009-06-21 NOTE — ED Notes (Shared)
I have reviewed discharge instructions with the patient.  The patient verbalized understanding.

## 2009-08-15 LAB — POC CARDIAC MARKERS W BNP
BNP: <5 pg/mL (ref 0.0–100.0)
CK-MB: <1 ng/mL (ref 0.0–8.0)
Myoglobin: <5 ng/mL (ref 0–170)
Troponin-I: <0.05 ng/mL (ref 0.00–0.30)

## 2009-08-15 MED ADMIN — nalbuphine (NUBAIN) injection 10 mg: INTRAVENOUS | NDC 00409146301

## 2009-08-15 MED ADMIN — ondansetron (ZOFRAN) injection 4 mg: INTRAVENOUS | NDC 00143989105

## 2009-08-15 NOTE — ED Notes (Signed)
Bedside report given to Michael RN

## 2009-08-15 NOTE — ED Notes (Signed)
Assumed care of pt

## 2009-08-15 NOTE — ED Notes (Signed)
MD at bedside with pt

## 2009-08-16 LAB — EKG, 12 LEAD, INITIAL
Atrial Rate: 89 {beats}/min
Calculated P Axis: 62 degrees
Calculated R Axis: 84 degrees
Calculated T Axis: 68 degrees
Diagnosis: NORMAL
P-R Interval: 156 ms
Q-T Interval: 358 ms
QRS Duration: 88 ms
QTC Calculation (Bezet): 435 ms
Ventricular Rate: 89 {beats}/min

## 2009-08-16 LAB — CBC WITH AUTOMATED DIFF
ABS. BASOPHILS: 0 10*3/uL (ref 0.0–0.2)
ABS. EOSINOPHILS: 0.4 10*3/uL (ref 0.0–0.8)
ABS. IMM. GRANS.: 0 10*3/uL (ref 0.0–2.0)
ABS. LYMPHOCYTES: 0.8 10*3/uL (ref 0.5–4.6)
ABS. MONOCYTES: 0.1 10*3/uL (ref 0.1–1.3)
ABS. NEUTROPHILS: 3.9 10*3/uL (ref 1.7–8.2)
BASOPHILS: 0 % (ref 0.0–2.0)
EOSINOPHILS: 7 % (ref 0.5–7.8)
HCT: 39.3 % (ref 35.8–46.3)
HGB: 12.9 g/dL (ref 11.7–15.4)
IMMATURE GRANULOCYTES: 0.2 % (ref 0.0–2.0)
LYMPHOCYTES: 15 % (ref 13–44)
MCH: 33 PG — ABNORMAL HIGH (ref 26.1–32.9)
MCHC: 32.8 g/dL (ref 31.4–35.0)
MCV: 100.5 FL — ABNORMAL HIGH (ref 79.6–97.8)
MONOCYTES: 2 % — ABNORMAL LOW (ref 4.0–12.0)
MPV: 10.4 FL — ABNORMAL LOW (ref 10.8–14.1)
NEUTROPHILS: 76 % (ref 43–78)
PLATELET: 166 10*3/uL (ref 150–450)
RBC: 3.91 M/uL — ABNORMAL LOW (ref 4.05–5.25)
RDW: 13.2 % (ref 11.9–14.6)
WBC: 5.2 10*3/uL (ref 4.3–11.1)

## 2009-08-16 LAB — PROTHROMBIN TIME + INR
INR: 1.9 — ABNORMAL HIGH (ref 0.9–1.2)
Prothrombin time: 19 SECS — ABNORMAL HIGH (ref 9.4–10.8)

## 2009-08-16 LAB — METABOLIC PANEL, COMPREHENSIVE
A-G Ratio: 0.8 — ABNORMAL LOW (ref 1.2–3.5)
ALT (SGPT): 70 U/L — ABNORMAL HIGH (ref 39–65)
AST (SGOT): 48 U/L — ABNORMAL HIGH (ref 15–37)
Albumin: 3.7 g/dL (ref 3.5–5.0)
Alk. phosphatase: 360 U/L — ABNORMAL HIGH (ref 50–136)
Anion gap: 10 mmol/L (ref 7–16)
BUN: 7 MG/DL (ref 6–23)
Bilirubin, total: 0.5 MG/DL (ref 0.2–1.1)
CO2: 23 MMOL/L (ref 23–32)
Calcium: 9.5 MG/DL (ref 8.3–10.4)
Chloride: 102 MMOL/L (ref 98–107)
Creatinine: 0.9 MG/DL (ref 0.6–1.5)
GFR est AA: >60 mL/min/{1.73_m2} (ref >60)
GFR est non-AA: >60 mL/min/{1.73_m2} (ref >60)
Globulin: 4.5 g/dL — ABNORMAL HIGH (ref 2.3–3.5)
Glucose: 103 MG/DL — ABNORMAL HIGH (ref 65–100)
Potassium: 4 MMOL/L (ref 3.5–5.1)
Protein, total: 8.2 g/dL (ref 6.3–8.2)
Sodium: 135 MMOL/L — ABNORMAL LOW (ref 136–145)

## 2009-08-16 LAB — MAGNESIUM: Magnesium: 2.1 MG/DL (ref 1.8–2.4)

## 2009-08-16 MED ADMIN — SALINE PERIPHERAL FLUSH Q8H 5 mL: @ 02:00:00 | NDC 17474300205

## 2009-08-16 MED ADMIN — nalbuphine (NUBAIN) injection 10 mg: INTRAVENOUS | @ 02:00:00 | NDC 00409146301

## 2009-08-16 MED ADMIN — hydrocodone-acetaminophen (LORTAB) 7.5-500 mg per tablet 1 Tab: ORAL | @ 06:00:00 | NDC 63739014110

## 2009-08-16 MED ADMIN — nuclear medicine isotope: @ 05:00:00 | NDC 88888888806

## 2009-08-16 MED ADMIN — heparin (porcine) pf 100 unit/mL: @ 06:00:00

## 2009-08-16 MED ADMIN — nuclear medicine isotope: @ 04:00:00 | NDC 88888888806

## 2009-08-16 NOTE — ED Notes (Signed)
The patient was observed in the ED.    Results Reviewed:      Recent Results (from the past 24 hour(s))   POC CARDIAC MARKERS W BNP    Collection Time    08/15/09  7:16 PM   Component Value Range   ??? Myoglobin (POC) <5  0 - 170 (ng/mL)   ??? CK-MB (POC) <1.0  0.0 - 8.0 (ng/mL)   ??? Troponin-I, (POC) <0.05  0.00 - 0.30 (ng/mL)   ??? B-NP <5  0.0 - 100.0 (pg/mL)   METABOLIC PANEL, COMPREHENSIVE    Collection Time    08/15/09  8:00 PM   Component Value Range   ??? Sodium 135 (*) 136 - 145 (MMOL/L)   ??? Potassium 4.0  3.5 - 5.1 (MMOL/L)   ??? Chloride 102  98 - 107 (MMOL/L)   ??? CO2 23  23 - 32 (MMOL/L)   ??? Anion gap 10  7 - 16 (mmol/L)   ??? Glucose 103 (*) 65 - 100 (MG/DL)   ??? BUN 7  6 - 23 (MG/DL)   ??? Creatinine 0.9  0.6 - 1.5 (MG/DL)   ??? GFR est AA >60  >60 (ml/min/1.61m2)   ??? GFR est non-AA >60  >60 (ml/min/1.10m2)   ??? Calcium 9.5  8.3 - 10.4 (MG/DL)   ??? Bilirubin, total 0.5  0.2 - 1.1 (MG/DL)   ??? ALT 70 (*) 39 - 65 (U/L)   ??? AST 48 (*) 15 - 37 (U/L)   ??? Alk. phosphatase 360 (*) 50 - 136 (U/L)   ??? Protein, total 8.2  6.3 - 8.2 (g/dL)   ??? Albumin 3.7  3.5 - 5.0 (g/dL)   ??? Globulin 4.5 (*) 2.3 - 3.5 (g/dL)   ??? A-G Ratio 0.8 (*) 1.2 - 3.5 ( )   MAGNESIUM    Collection Time    08/15/09  8:00 PM   Component Value Range   ??? Magnesium 2.1  1.8 - 2.4 (MG/DL)   CBC WITH AUTOMATED DIFF    Collection Time    08/15/09  8:00 PM   Component Value Range   ??? WBC 5.2  4.3 - 11.1 (K/uL)   ??? RBC 3.91 (*) 4.05 - 5.25 (M/uL)   ??? HGB 12.9  11.7 - 15.4 (g/dL)   ??? HCT 39.3  35.8 - 46.3 (%)   ??? MCV 100.5 (*) 79.6 - 97.8 (FL)   ??? MCH 33.0 (*) 26.1 - 32.9 (PG)   ??? MCHC 32.8  31.4 - 35.0 (g/dL)   ??? RDW 13.2  11.9 - 14.6 (%)   ??? PLATELET 166  150 - 450 (K/uL)   ??? MPV 10.4 (*) 10.8 - 14.1 (FL)   ??? DF AUTOMATED     ??? NEUTROPHILS 76  43 - 78 (%)   ??? LYMPHOCYTES 15  13 - 44 (%)   ??? MONOCYTES 2 (*) 4.0 - 12.0 (%)   ??? EOSINOPHILS 7  0.5 - 7.8 (%)   ??? BASOPHILS 0  0.0 - 2.0 (%)   ??? ABSOLUTE NEUTS 3.9  1.7 - 8.2 (K/UL)   ??? ABSOLUTE LYMPHS 0.8  0.5 - 4.6 (K/UL)    ??? ABSOLUTE MONOS 0.1  0.1 - 1.3 (K/UL)   ??? ABSOLUTE EOSINS 0.4  0.0 - 0.8 (K/UL)   ??? ABSOLUTE BASOS 0.0  0.0 - 0.2 (K/UL)   ??? IMM. GRANS. 0.2  0.0 - 2.0 (%)   ??? ABS. IMM. GRANS. 0.0  0.0 - 2.0 (K/UL)  PROTHROMBIN TIME    Collection Time    08/15/09  8:00 PM   Component Value Range   ??? Prothrombin Time-PT 19.0 (*) 9.4 - 10.8 (SECS)   ??? INR 1.9 (*) 0.9 - 1.2 ( )         I discussed the results of all labs, procedures, radiographs, and treatments with the patient and available family.  Treatment plan is agreed upon and the patient is ready for discharge.  All voiced understanding of the discharge plan and medication instructions or changes as appropriate.  Questions about treatment in the ED were answered.  All were encouraged to return should symptoms worsen or new problems develop.

## 2009-08-16 NOTE — ED Provider Notes (Signed)
Patient is a 44 y.o. female presenting with chest pain. [name] No language interpreter was used.   Chest Pain   This is a recurrent problem. The current episode started 6 to 12 hours ago. The problem has not changed since onset. Duration of episode(s) is 12 hours. The pain is associated with normal activity. The pain is present in the lateral region. The pain is at a severity of 7/10. The quality of the pain is described as sharp, stabbing and pleuritic. The symptoms are aggravated by certain positions and deep breathing. Pertinent negatives include no diaphoresis, no fever, no numbness, no irregular heartbeat, no palpitations, no nausea, no vomiting and no shortness of breath. She has tried nothing for the symptoms. Her past medical history is significant for DVT and PE.       Past Medical History   Diagnosis Date   ??? 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 Nephrolithiasis Multiple episodes   ??? Anxiety    ??? Factor V Leiden, prothrombin gene mutation    ??? Neurological disorder      migraines   ??? Chronic kidney disease      kidney stones   ??? Infectious disease      MRSA   ??? Other ill-defined conditions      factor 5, mult PEs          Past Surgical History   Procedure Date   ??? Cystoscopy ???   ??? Lithotripsy 2006   ??? Hx vascular access July, 2010     Power Port placed in [place], [place].   ??? Hx appendectomy ???   ??? Hx tonsillectomy    ??? Hx urological      cystoscopex13   ??? Hx cesarean section    ??? Hx tubal ligation            No family history on file.     History   Social History   ??? Marital Status: Divorced     Spouse Name: N/A     Number of Children: N/A   ??? Years of Education: N/A   Occupational History   ??? Not on file.   Social History Main Topics   ??? Smoking status: Never Smoker    ??? Smokeless tobacco: Never Used   ??? Alcohol Use: No   ??? Drug Use: No   ??? Sexually Active: Yes -- Female partner(s)     Birth Control/ Protection: Surgical    Other Topics Concern   ??? Not on file   Social History Narrative   ??? No narrative on file                    ALLERGIES: Codeine, Pcn, Prednisone, Ketorolac tromethamine, Shellfish, Iv dye, iodine containing, Influenza virus vaccine, Pneumovax 23, Morphine, Motrin and Ativan      Review of Systems   Constitutional: Negative for fever, chills and diaphoresis.   Respiratory: Negative for shortness of breath and wheezing.    Cardiovascular: Positive for chest pain. Negative for palpitations.   Gastrointestinal: Negative for nausea and vomiting.   Neurological: Negative for numbness.   All other systems reviewed and are negative.        Filed Vitals:    08/15/09 2330 08/15/09 2345 08/16/09 0043 08/16/09 0044   BP: 125/66 124/69 103/55    Pulse: 95 93  88   Temp:  Resp: 23 18     Height:       Weight:       SpO2: 91% 92%  93%              Physical Exam   Nursing note and vitals reviewed.  Constitutional: She is oriented to person, place, and time. She appears well-developed and well-nourished.   HENT:   Head: Normocephalic and atraumatic.   Right Ear: External ear normal.   Left Ear: External ear normal.   Nose: Nose normal.   Mouth/Throat: Oropharynx is clear and moist.   Eyes: Conjunctivae are normal. Pupils are equal, round, and reactive to light.   Neck: Normal range of motion. Neck supple.   Cardiovascular: Normal rate, regular rhythm and normal heart sounds.    Pulmonary/Chest: Effort normal and breath sounds normal. No respiratory distress. She has no wheezes. She has no rales.   Abdominal: Soft. Bowel sounds are normal. She exhibits no distension. No tenderness. She has no rebound and no guarding.   Musculoskeletal: Normal range of motion. She exhibits no edema.   Neurological: She is alert and oriented to person, place, and time.   Skin: Skin is warm and dry.   Psychiatric: She has a normal mood and affect. Her behavior is normal.        MDM     Amount and/or Complexity of Data Reviewed:    Clinical lab tests:  Ordered and reviewed  Tests in the radiology section of CPT??:  Ordered and reviewed  Tests in the medicine section of the CPT??:  Ordered and reviewed  Discussion of test results with the performing providers:  No   Decide to obtain previous medical records or to obtain history from someone other than the patient:  Yes   Obtain history from someone other than the patient:  No   Review and summarize past medical records:  Yes   Discuss the patient with another provider:  No   Independant visualization of image, tracing, or specimen:  Yes  Risk of Significant Complications, Morbidity, and/or Mortality:   Presenting problems:  Moderate  Diagnostic procedures:  Moderate  Management options:  Moderate  Progress:   Patient progress:  Stable      Procedures

## 2009-08-16 NOTE — ED Notes (Signed)
I have reviewed medications, follow up provider options, and discharge instructions with the patient. The patient verbalized understanding. Copy of discharge information given to patient upon discharge. Prescription(s) given to patient. Patient discharged ambulatory in no distress. Patient instructed not to drive while under influence of narcotic pain medications. Patient taking a cab home.

## 2009-09-21 ENCOUNTER — Ambulatory Visit
Admit: 2009-09-21 | Discharge: 2009-09-26 | Disposition: A | Discharge status: Home / Self Care | Source: Home / Self Care | Attending: Internal Medicine | Admitting: Internal Medicine

## 2009-09-21 DIAGNOSIS — R410 Disorientation, unspecified: Secondary | ICD-10-CM

## 2009-09-21 LAB — EKG, 12 LEAD, INITIAL
Atrial Rate: 122 {beats}/min
Calculated P Axis: 54 degrees
Calculated R Axis: 77 degrees
Calculated T Axis: 52 degrees
P-R Interval: 156 ms
Q-T Interval: 314 ms
QRS Duration: 94 ms
QTC Calculation (Bezet): 447 ms
Ventricular Rate: 122 {beats}/min

## 2009-09-21 LAB — AMMONIA: Ammonia, plasma: 17 umol/L (ref 11–32)

## 2009-09-21 LAB — CBC WITH AUTOMATED DIFF
ABS. BASOPHILS: 0 10*3/uL (ref 0.0–0.2)
ABS. BASOPHILS: 0 10*3/uL (ref 0.0–0.2)
ABS. EOSINOPHILS: 0 10*3/uL (ref 0.0–0.8)
ABS. EOSINOPHILS: 0 10*3/uL (ref 0.0–0.8)
ABS. IMM. GRANS.: 0 10*3/uL (ref 0.0–2.0)
ABS. IMM. GRANS.: 0 10*3/uL (ref 0.0–2.0)
ABS. LYMPHOCYTES: 0.8 10*3/uL (ref 0.5–4.6)
ABS. LYMPHOCYTES: 0.7 10*3/uL (ref 0.5–4.6)
ABS. MONOCYTES: 0.9 10*3/uL (ref 0.1–1.3)
ABS. MONOCYTES: 0.4 10*3/uL (ref 0.1–1.3)
ABS. NEUTROPHILS: 8.2 10*3/uL (ref 1.7–8.2)
ABS. NEUTROPHILS: 6.5 10*3/uL (ref 1.7–8.2)
BASOPHILS: 0 % (ref 0.0–2.0)
BASOPHILS: 0 % (ref 0.0–2.0)
EOSINOPHILS: 0 % — ABNORMAL LOW (ref 0.5–7.8)
EOSINOPHILS: 0 % — ABNORMAL LOW (ref 0.5–7.8)
HCT: 32.1 % — ABNORMAL LOW (ref 35.8–46.3)
HCT: 28.7 % — ABNORMAL LOW (ref 35.8–46.3)
HGB: 10.7 g/dL — ABNORMAL LOW (ref 11.7–15.4)
HGB: 9.5 g/dL — ABNORMAL LOW (ref 11.7–15.4)
IMMATURE GRANULOCYTES: 0.2 % (ref 0.0–2.0)
IMMATURE GRANULOCYTES: 0.3 % (ref 0.0–2.0)
LYMPHOCYTES: 8 % — ABNORMAL LOW (ref 13–44)
LYMPHOCYTES: 9 % — ABNORMAL LOW (ref 13–44)
MCH: 31.9 PG (ref 26.1–32.9)
MCH: 31.6 PG (ref 26.1–32.9)
MCHC: 33.3 g/dL (ref 31.4–35.0)
MCHC: 33.1 g/dL (ref 31.4–35.0)
MCV: 95.8 FL (ref 79.6–97.8)
MCV: 95.3 FL (ref 79.6–97.8)
MONOCYTES: 9 % (ref 4.0–12.0)
MONOCYTES: 6 % (ref 4.0–12.0)
MPV: 9.7 FL — ABNORMAL LOW (ref 10.8–14.1)
MPV: 9.5 FL — ABNORMAL LOW (ref 10.8–14.1)
NEUTROPHILS: 83 % — ABNORMAL HIGH (ref 43–78)
NEUTROPHILS: 85 % — ABNORMAL HIGH (ref 43–78)
PLATELET: 216 10*3/uL (ref 150–450)
PLATELET: 170 10*3/uL (ref 150–450)
RBC: 3.35 M/uL — ABNORMAL LOW (ref 4.05–5.25)
RBC: 3.01 M/uL — ABNORMAL LOW (ref 4.05–5.25)
RDW: 12.9 % (ref 11.9–14.6)
RDW: 13.1 % (ref 11.9–14.6)
WBC: 9.8 10*3/uL (ref 4.3–11.1)
WBC: 7.7 10*3/uL (ref 4.3–11.1)

## 2009-09-21 LAB — URINALYSIS W/ REFLEX CULTURE
Bilirubin: NEGATIVE
Blood: NEGATIVE
Casts: 0 /LPF
Crystals, urine: 0 /LPF
Epithelial cells: 0 /HPF
Glucose: NEGATIVE MG/DL
Ketone: NEGATIVE MG/DL
Leukocyte Esterase: NEGATIVE
Mucus: 0 /LPF
Nitrites: NEGATIVE
Protein: NEGATIVE MG/DL
Specific gravity: <1.005 (ref 1.001–1.023)
Urobilinogen: 0.2 EU/DL (ref 0.2–1.0)
WBC: 0 /HPF
pH (UA): 6 (ref 5.0–9.0)

## 2009-09-21 LAB — METABOLIC PANEL, COMPREHENSIVE
A-G Ratio: 1.2 (ref 1.2–3.5)
A-G Ratio: 0.9 — ABNORMAL LOW (ref 1.2–3.5)
ALT (SGPT): 77 U/L — ABNORMAL HIGH (ref 39–65)
ALT (SGPT): 64 U/L (ref 39–65)
AST (SGOT): 31 U/L (ref 15–37)
AST (SGOT): 43 U/L — ABNORMAL HIGH (ref 15–37)
Albumin: 3.8 g/dL (ref 3.5–5.0)
Albumin: 3.2 g/dL — ABNORMAL LOW (ref 3.5–5.0)
Alk. phosphatase: 212 U/L — ABNORMAL HIGH (ref 50–136)
Alk. phosphatase: 188 U/L — ABNORMAL HIGH (ref 50–136)
Anion gap: 12 mmol/L (ref 7–16)
Anion gap: 9 mmol/L (ref 7–16)
BUN: 10 MG/DL (ref 6–23)
BUN: 8 MG/DL (ref 6–23)
Bilirubin, total: 0.9 MG/DL (ref 0.2–1.1)
Bilirubin, total: 1 MG/DL (ref 0.2–1.1)
CO2: 25 MMOL/L (ref 23–32)
CO2: 26 MMOL/L (ref 23–32)
Calcium: 9.4 MG/DL (ref 8.3–10.4)
Calcium: 8.9 MG/DL (ref 8.3–10.4)
Chloride: 104 MMOL/L (ref 98–107)
Chloride: 108 MMOL/L — ABNORMAL HIGH (ref 98–107)
Creatinine: 1 MG/DL (ref 0.6–1.5)
Creatinine: 0.8 MG/DL (ref 0.6–1.5)
GFR est AA: >60 mL/min/{1.73_m2} (ref >60)
GFR est AA: >60 mL/min/{1.73_m2} (ref >60)
GFR est non-AA: >60 mL/min/{1.73_m2} (ref >60)
GFR est non-AA: >60 mL/min/{1.73_m2} (ref >60)
Globulin: 3.1 g/dL (ref 2.3–3.5)
Globulin: 3.4 g/dL (ref 2.3–3.5)
Glucose: 142 MG/DL — ABNORMAL HIGH (ref 65–100)
Glucose: 92 MG/DL (ref 65–100)
Potassium: 3.7 MMOL/L (ref 3.5–5.1)
Potassium: 3.6 MMOL/L (ref 3.5–5.1)
Protein, total: 6.9 g/dL (ref 6.3–8.2)
Protein, total: 6.6 g/dL (ref 6.3–8.2)
Sodium: 141 MMOL/L (ref 136–145)
Sodium: 143 MMOL/L (ref 136–145)

## 2009-09-21 LAB — DRUG SCREEN, URINE
ACETAMINOPHEN: NEGATIVE
AMPHETAMINES: NEGATIVE
BARBITURATES: NEGATIVE
BENZODIAZEPINES: POSITIVE
COCAINE: NEGATIVE
METHADONE: NEGATIVE
Methamphetamines: NEGATIVE
OPIATES: NEGATIVE
PCP(PHENCYCLIDINE): NEGATIVE
THC (TH-CANNABINOL): NEGATIVE
TRICYCLICS: NEGATIVE

## 2009-09-21 LAB — PHOSPHORUS: Phosphorus: 2.3 MG/DL — ABNORMAL LOW (ref 2.5–4.5)

## 2009-09-21 LAB — MAGNESIUM: Magnesium: 1.4 MG/DL — CL (ref 1.8–2.4)

## 2009-09-21 LAB — MRSA SCREEN - PCR (NASAL)

## 2009-09-21 LAB — LACTIC ACID: Lactic acid: 0.8 MMOL/L (ref 0.4–2.0)

## 2009-09-21 LAB — CK
CK: 150 U/L (ref 21–215)
CK: 1838 U/L — ABNORMAL HIGH (ref 21–215)

## 2009-09-21 LAB — PROTHROMBIN TIME + INR
INR: 1.1 (ref 0.9–1.2)
INR: 1.1 (ref 0.9–1.2)
Prothrombin time: 10.7 SECS (ref 9.4–10.8)
Prothrombin time: 10.7 SECS (ref 9.4–10.8)

## 2009-09-21 LAB — SALICYLATE: Salicylate level: 1 MG/DL — ABNORMAL LOW (ref 2.8–20.0)

## 2009-09-21 LAB — T4, FREE: T4, Free: 1.2 NG/DL (ref 0.9–1.76)

## 2009-09-21 LAB — TSH 3RD GENERATION: TSH: 1.338 u[IU]/mL (ref 0.358–3.740)

## 2009-09-21 LAB — TROPONIN I: Troponin-I, Qt.: <0.04 NG/ML — ABNORMAL LOW (ref 0.04–0.05)

## 2009-09-21 LAB — ETHYL ALCOHOL: ALCOHOL(ETHYL),SERUM: <3 MG/DL

## 2009-09-21 LAB — ACETAMINOPHEN: Acetaminophen level: 0 ug/mL — ABNORMAL LOW (ref 10.0–30.0)

## 2009-09-21 MED ADMIN — dexmedetomidine (PRECEDEX) 200 mcg in 0.9% sodium chloride 50 mL infusion: INTRAVENOUS | @ 23:00:00 | NDC 71288050503

## 2009-09-21 MED ADMIN — haloperidol lactate (HALDOL) injection 5 mg: INTRAVENOUS | @ 16:00:00 | NDC 63323047401

## 2009-09-21 MED ADMIN — midazolam in normal saline (VERSED) 1 mg/mL infusion: INTRAVENOUS | @ 19:00:00 | NDC 24200056304

## 2009-09-21 MED ADMIN — midazolam (VERSED) injection 2 mg: INTRAMUSCULAR | @ 13:00:00 | NDC 00409230801

## 2009-09-21 MED ADMIN — sodium chloride 0.9 % bolus infusion 250 mL: INTRAVENOUS | @ 19:00:00 | NDC 00409798309

## 2009-09-21 MED ADMIN — etomidate (AMIDATE) 2 mg/mL injection: @ 16:00:00 | NDC 55390076320

## 2009-09-21 MED ADMIN — midazolam (VERSED) injection 2 mg: INTRAVENOUS | @ 15:00:00 | NDC 00409230801

## 2009-09-21 MED ADMIN — 0.9% sodium chloride infusion: INTRAVENOUS | @ 20:00:00 | NDC 00409798309

## 2009-09-21 MED ADMIN — midazolam (VERSED) injection 2 mg: INTRAMUSCULAR | @ 14:00:00 | NDC 00409230801

## 2009-09-21 MED ADMIN — vancomycin (VANCOCIN) 1.25 g in 0.9% sodium chloride 250 mL infusion: INTRAVENOUS | @ 22:00:00 | NDC 63323031461

## 2009-09-21 MED ADMIN — midazolam in normal saline (VERSED) 1 mg/mL infusion: INTRAVENOUS | @ 18:00:00 | NDC 24200056304

## 2009-09-21 MED ADMIN — haloperidol lactate (HALDOL) injection 5 mg: INTRAVENOUS | @ 15:00:00 | NDC 63323047401

## 2009-09-21 MED ADMIN — 0.9% sodium chloride infusion: INTRAVENOUS | @ 21:00:00 | NDC 87701099893

## 2009-09-21 MED ADMIN — ziprasidone (GEODON) 20 mg in sterile water (preservative free) injection: INTRAMUSCULAR | @ 18:00:00 | NDC 00409488710

## 2009-09-21 MED ADMIN — cefTRIAXone (ROCEPHIN) 1 g in 0.9% sodium chloride (MBP/ADV) 50 mL MBP: INTRAVENOUS | @ 21:00:00 | NDC 00781320885

## 2009-09-21 MED ADMIN — enoxaparin (LOVENOX) injection 80 mg: SUBCUTANEOUS | @ 22:00:00 | NDC 00075062280

## 2009-09-21 MED ADMIN — 0.9% sodium chloride infusion: INTRAVENOUS | @ 16:00:00 | NDC 00409798309

## 2009-09-21 MED ADMIN — midazolam in normal saline (VERSED) 1 mg/mL infusion: INTRAVENOUS | @ 20:00:00 | NDC 66758001802

## 2009-09-21 MED ADMIN — haloperidol lactate (HALDOL) injection 10 mg: INTRAMUSCULAR | @ 13:00:00 | NDC 63323047401

## 2009-09-21 MED ADMIN — dexmedetomidine (PRECEDEX) 200 mcg in 0.9% sodium chloride 50 mL infusion: INTRAVENOUS | @ 22:00:00 | NDC 00409798436

## 2009-09-21 MED ADMIN — sodium chloride 0.9 % bolus infusion 1,000 mL: INTRAVENOUS | @ 15:00:00 | NDC 00409798309

## 2009-09-21 NOTE — ED Notes (Signed)
Pt trying to climb out of bed. Pt will follow commands when asked but then will start fidgeting around and trying to climb out of bed soon after. Pt placed on oxygen and took the tubing out and bit in in half. Dr. Constance Goltz at bedside. Pt keeps looking at things in the room and "saying I need that" or "fix that."

## 2009-09-21 NOTE — H&P (Signed)
See dictated H&P. Job ID # [id number].    A/P;    Acute Delirium sec to drug abuse (? Bath salt) vs drug withdrawal vs sepsis vs acute psych disorder.  Factor 5 Leiden mutation, prior PE, Coumadin non complaince  Sinus tachy.  Port in place.  Prior hx of MRSA Bacteremia.  Bipolar.  PLAN;  Admit to ICU (pt on Versed infusion)  GEeodon, prn.  Check blood cxs, ABG now.  Check salicylate level for completion  Monitor CPK with excessive physical activity,with agitation.  Check free T4, free T3, TSH  Empiric ceftriaxone, vanco with low fever, port, prior MRSA bacteremia.  Aggressive IV hydration,  Psych input requested.  MRI brain am if not better  .

## 2009-09-21 NOTE — ED Notes (Signed)
Staff sitting at bedside or interacting with patient at all times. She does recognize me. She even recalls some conversations we have had in the past. She yells, sits up and struggles against the restraints with great strength rocking the cart. She states she needs to active the pool. Re-oriented that she is at [facility] ER, but she states "I know, not right now". N/V intact to all extremities distal to restraints. + radial pulses.

## 2009-09-21 NOTE — Progress Notes (Signed)
TRANSFER - IN REPORT:    Verbal report received from [facility] on Krista Lopez  being received from ED for routine progression of care      Report consisted of patient???s Situation, Background, Assessment and   Recommendations(SBAR).     Information from the following report(s) SBAR, Kardex, ED Summary, Intake/Output, MAR, Accordion and Recent Results was reviewed with the receiving nurse.    Opportunity for questions and clarification was provided.      Assessment completed upon patient???s arrival to unit and care assumed.

## 2009-09-21 NOTE — ED Notes (Signed)
MD down to admit pt

## 2009-09-21 NOTE — ED Notes (Signed)
Pt thrashing and screaming. She has had mult doses Versed + Haldol. Now with IV access and Foley. Will need more sedation for CT head.

## 2009-09-21 NOTE — ED Notes (Signed)
Pt still restless and trying to get out of bed. Tries to pull out of restrains and tries to climb out of bed at times. Rich tech sitting with pt. Pt nad

## 2009-09-21 NOTE — ED Notes (Signed)
EKG - ST @ 122

## 2009-09-21 NOTE — Progress Notes (Addendum)
Dr. Storm Frisk paged at 0710.  Patient's magnesium level 1.4 at 1614.  Replacement of 2 gram iv ordered by Leward Quan, NP at [phone].

## 2009-09-21 NOTE — ED Notes (Signed)
Pt states "took the pills because Thayer Ohm her Grandfather drank alcohol"

## 2009-09-21 NOTE — Progress Notes (Signed)
Report to Ann Dodd RN.

## 2009-09-21 NOTE — ED Notes (Signed)
TRANSFER - OUT REPORT:    Verbal report given to Liss RN(name) on Krista Lopez  being transferred to 3111(unit) for routine progression of care       Report consisted of patient???s Situation, Background, Assessment and   Recommendations(SBAR).     Information from the following report(s) ED Summary was reviewed with the receiving nurse.    Opportunity for questions and clarification was provided.

## 2009-09-21 NOTE — ED Notes (Signed)
As soon as pt was notified that she is going up stairs pt stops trying to get off of the bed or take things off of her. Pt was calm when got to ICU room

## 2009-09-21 NOTE — ED Notes (Signed)
Pt to CT on HR, BP, and pulse ox monitor with Dr. Constance Goltz, Luan Pulling tech, RN, and Kathlene November RN

## 2009-09-21 NOTE — Progress Notes (Signed)
Spiritual Care Assessment/Progress Notes    BERNISHA VERMA [id number]  [id number]    1965-06-29  44 y.o.  female    Patient Telephone Number: [phone] (home)   Religious Affiliation: Catholic   Language: English   Extended Emergency Contact Information  Primary Emergency Contact: Valinda Hoar  Home Phone: [phone]  Relation: Other Relative   Patient Active Problem List   Diagnoses Date Noted   ??? Delirium, acute [780.09EN] 09/21/2009   ??? Tachycardia [785.0H] 09/21/2009   ??? Bipolar 2 disorder [296.47M] 09/21/2009   ??? Migraine [346.90A] 09/21/2009   ??? Restless leg syndrome [333.94J] 09/21/2009   ??? Iron deficiency anemia [280.9AM] 09/21/2009   ??? Nephrolithiasis [592.0L]    ??? Pulmonary embolism [415.19AD]    ??? Iron deficiency [275.09B] 10/06/2008   ??? Pleuritic chest pain [786.52D] 10/04/2008   ??? Factor V deficiency [286.3AY] 10/04/2008   ??? Depression [311L] 10/04/2008   ??? Anxiety [300.00E] 10/04/2008   ??? Patient noncompliance [V15.81K] 10/04/2008   ??? 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  []           Not able to assess due to medical condition    Services Provided Today:  []           crisis intervention    []           reading Scriptures  [x]           spiritual assessment    []           prayer  []           empathic listening/emotional support  []           rites and rituals (cite in comments)  []           life review     []           religious support   []           theological development   []           advocacy  []           ethical dialog     []           blessing  []           bereavement support    []           support to family  []           anticipatory grief support   []           help with AMD  []           spiritual guidance    []   meditation      Spiritual Care Needs  []           Emotional Support  []           Spiritual/Religious Care  []           Loss/Adjustment  []           Advocacy/Referral /Ethics  [x]           No needs expressed at this time  []           Other: (note in comments)  Spiritual Care Plan  []           Follow up visits with pt/family  []           Provide materials  []           Schedule sacraments  []           Contact [facility]  [x]           Follow up as needed  []           Other: (note in comments)     Comments: PT assessed via chart/staff due to medical condition of PT.  Rev. Donnie Coffin, M.Div  Chaplain  [employer]. [facility]     Romeo Rabon

## 2009-09-21 NOTE — H&P (Signed)
[facility]                            [address]                           [place], [place]. [address]                                [phone]                              HISTORY AND PHYSICAL    NAME:  Krista Lopez, Krista Lopez                         MR:  [id number]  LOC:  IC  [id number]             SEX:  F               ACCT:  [id number]  DOB:  11/16/1965            AGE:  44              PT:  I  ADMIT:  09/21/2009          DSCH:                 MSV:  MED      DATE OF ADMISSION:  09/21/2009    ADMITTING PHYSICIAN:  Dr. Darlina Guys    CHIEF COMPLAINT:  Acute agitation, restlessness, delirium.    HISTORY OF PRESENT ILLNESS:  The patient is a 44 year old female with  prior history of numerous ED visits, history of factor V Leiden mutation,  related to prior pulmonary embolism, on chronic Coumadin therapy, chronic  pain, narcotic-seeking behavior, history of bipolar disorder, who had  been brought to the emergency room by paramedics in an agitated,  delirious state.    History obtained from the ER physician and the nursing. Paramedics'  report not available for my review. Apparently paramedics were called by  people who suspected having drug overdose. However, when they arrived, nobody  was available. Apparently no drug bottles or alcohol was found nearby.  The patient was found in an extremely agitated, delirious state, confused  and disoriented, and uncooperative. Brought to the emergency room.    Here in the ER, as she has received multiple doses of IV Haldol, Versed,  Geodon, and finally with continued agitation, with requirement for CT head, was  placed on Versed infusion, which she is receiving at 5 mg/hour currently.  Lab work as mentioned below.    Vital signs on arrival were pertinent for a tympanic temperature of 99,  pulse 120 to 130, respirations 18, blood pressure 130/83, pulse oximetry  initially 86% on room air, subsequently 100% on 4 L.     Lab work shows normal WBC count with a left shift, negative urinalysis  for infection, INR only 1.1, though on reported Coumadin therapy. Normal  renal function, normal electrolytes, and elevated alkaline phosphatase,  which has been noticed even in the past. Urine toxicology screen positive  only for benzodiazepines with reported irregular use of Klonopin at home.  Alcohol less than 3. Acetaminophen zero.    CT scan of head without contrast had been finally done,  which did not  show acute intracranial any finding.    During my examination, the patient extremely agitated. When queried about  taking medication, she started saying that somebody brought her  Roxicodone, which she took last night. Did not answer my questions of  whether she drinks alcohol regularly or any drug abuse or abrupt  withdrawal of any drugs. Trying to get up several times, but denies  shortness of breath.    PAST MEDICAL HISTORY  1. History of factor V Leiden mutation, hypercoagulopathy, prior history  of PEs, on chronic Coumadin therapy. Last V/Q scan July 2011, which  showed chronic small perfusion defects. Last CT chest with contrast was  August 2011, which showed no PE other than central ground-glass  opacities,, infection versus opacities, infection with  some edema.  2. History of nephrolithiasis.  3. History of MRSA bacteremia.  4. History of migraine headaches.  5. Neuropathy.  6. Bipolar disorder.  7. Narcotic pain medication-seeking behavior per multiple ER visits.  8. Iron-deficiency anemia.  9. Gallbladder sludge by ultrasonogram.  10. Colonoscopy done August 2010 showed normal colonoscopy with internal  hemorrhoids.  11. EGD done by Dr. Leonor Liv: Mild erosive esophagitis, minimal erosive  gastritis in August 2010.    PAST SURGICAL HISTORY:  Appendectomy. Tonsillectomy. Port placement.  C-section x2.    FAMILY HISTORY:  Father died from colon cancer. Mother had metastatic  breast cancer.     SOCIAL HISTORY:  Apparently never smoked per prior documentation. Drug  history not available, but patient had always denied any illicit drug use  in the past. Lives alone apparently. Socioeconomic status unavailable.    ALLERGIES:  MULTIPLE:  1. CODEINE.  2. PENICILLIN.  3. PREDNISONE.  4. KETOROLAC.  5. TROMETHAMINE.  6. SHELLFISH.  7. IV DYE.  8. IODINE.  9. INFLUENZA VIRUS VACCINE.  10. PNEUMOVAX.  11. MORPHINE CAUSED ITCHING.  12. MOTRIN.  13. ATIVAN (ITCHING).    REVIEW OF SYSTEMS  CONSTITUTIONAL:  No reported history of fever.  EYES:  No discharge.  EARS:  No reported hearing deficits.  CARDIOVASCULAR:  No history of chest pain. Denies chest pain or shortness  of breath.  PULMONARY:  Denies any cough, sputum.  GASTROINTESTINAL:  No reported history of vomiting or hematemesis or  melena.  PSYCHIATRIC:  History of bipolar disorder as above.  NEUROLOGIC:  No reported history of muscular weakness.    EXAMINATION  GENERAL:  Appears very agitated, trying to sit up several times and  trying to remove the restraints.  VITAL SIGNS:  Temperature 99, pulse 127 to 130, respirations 18, blood  pressure 137/88, pulse oximetry 97% on 4 L.  HEENT:  Normocephalic, atraumatic. Conjunctivae pink. Oral mucosa very  dry.  NECK:  No thyromegaly, lymphadenopathy, carotid bruits.  CARDIOVASCULAR:  Tachycardic. No murmur, gallop, or rub.  LUNGS:  No wheezing or rales.  ABDOMEN:  Soft. Liver and spleen not palpable. No focal rigidity or  guarding.  EXTREMITIES:  No calf, induration, tenderness, or erythema.  NEUROLOGIC:  Moves all 4 limbs against gravity, and her strength seems  normal. No facial weakness apparent.  PSYCHIATRIC:  Extremely agitated, not oriented to place.    LABORATORY AND DATA:  WBC 9.8, hemoglobin 10.7, hematocrit 32.1,  platelets 216. Urinalysis negative for WBCs, nitrites, or leukocyte  esterase. INR 1.1. Sodium 141,potassium 3.7, chloride 104, CO2 of 25, BUN   10, creatinine 1.0, bilirubin 0.9, total protein 6.9, albumin 3.8, ALT  77, AST 31, alkaline phosphatase 212. CK  150. Acetaminophen zero. Urine  toxicology screen negative for amphetamines, methamphetamines,  barbiturates, cocaine, methadone, opiates, PCP, THC, or tricyclics;  positive only for benzodiazepines.    CT scan head without contrast: No acute intracranial findings.    Previous lab reports pertinent here in January 2009, TSH 0.2.    ASSESSMENT AND PLAN  1. Acute delirium, unclear etiology. Possible etiologies would include  abrupt drug withdrawal or hallucinogenic drug abuse/overuse, versus  encephalopathy secondary to infection versus significant hypoxia. At this  time, with her being on IV Versed infusion, continued agitation with  associated continued need for sedation, will admit the patient to the intensive  care unit. Recommend checking blood cultures, 1 set from the port and the  other from peripheral line. Check the ABG now. Will repeat her  comprehensive metabolic panel and CPK and electrolytes shortly. At this  time, will continue IV Versed infusion and continue Geodon on p.r.n.  basis. For completion, include serum salicylate level as well. Recommend  empiric ceftriaxone, vancomycin therapy considering her narcotic-seeking  behavior before, current active port. The patient does have a history of  prior MRSA bacteremia.  It is  possible the patient could simply have ingested a hallucinogenic  drug, such as bath salts, either by inhalation or ingestion with findings  of severe tachycardia, hallucinations, agitation currently. The patient  denies such history when inquired. Aggressive IV hydration to continue.  If continued AMS will consider MRI  brain with and  without contrast in the a.m. and neurology evaluation.  2. With the history with bipolar disorder, with possibility of unobserved  worsening bipolar with continued hallucinations with acute psychosis, I   do recommend a psychiatric evaluation during this hospitalization while  ruling out other possible etiologies for her acute delirium.  3. History of pulmonary embolism. INR subtherapeutic. I doubt the patient  has been compliant with Coumadin. With her acute agitation, thrashing  around the bed, etc, will hold Coumadin therapy tonight. Check an ABG as  above.  4. Low TSH. This was noted in January 2009. With acute delirium,  recommend checking her TSH along with free T4, free T3 now. No history  available from any friends or family at this time.  5. History of narcotic pain medication-seeking behavior. Advised Dilaudid  on a p.r.n. basis at this time. It is unclear if she is withdrawing from  narcotic pain medication.  6. Anemia. Prior EGD, colonoscopy as above. If further fall, will  consider  stool Hemoccult, etc.  7. Elevated alkaline phosphatase, seems to be chronic. At this time,  recommend checking hepatitis panel alone if not done prior.  8. Deep venous thrombosis prophylaxis. Recommend heparin prophylaxis.    Total time spent: Over 80 minutes.                Montez Morita, MD                This is an unverified document unless signed by physician.    TID:  [id number]                                      DT:  09/21/2009  5:12 P  JOB:  [id number]        DOC#:  [id number]           DD:  09/21/2009    cc:  Montez Morita, MD

## 2009-09-21 NOTE — ED Provider Notes (Signed)
HPI Comments: 44 y.o. female with hx innumerable ED visits with hx clotting disorder, PE, chr pain, drug-seeking, psych issues presents in agitated delirious state, confused, disoriented, uncooperative, trying to get out of bed, unable to give hx. EMS states people with pt on site spoke of a drug overdose. Pt takes Coumadin, Klonopin, Imitrex. No focal neuro deficits.    Patient is a 44 y.o. female presenting with altered mental status. The history is provided by medical records and the EMS personnel.   Altered mental status   This is a new problem. Associated symptoms include confusion, agitation and delusions. Mental status baseline is normal.  Her past medical history is significant for depression and psychotropic medication treatment.        Past Medical History   Diagnosis Date   ??? 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 Nephrolithiasis Multiple episodes   ??? Anxiety    ??? Factor V Leiden, prothrombin gene mutation    ??? Neurological disorder      migraines   ??? Chronic kidney disease      kidney stones   ??? Infectious disease      MRSA   ??? Other ill-defined conditions      factor 5, mult PEs   ??? Depression           Past Surgical History   Procedure Date   ??? Cystoscopy ???   ??? Lithotripsy 2006   ??? Hx vascular access July, 2010     Power Port placed in [place], [place].   ??? Hx appendectomy ???   ??? Hx tonsillectomy    ??? Hx urological      cystoscopex13   ??? Hx cesarean section    ??? Hx tubal ligation            No family history on file.     History   Social History   ??? Marital Status: Divorced     Spouse Name: N/A     Number of Children: N/A   ??? Years of Education: N/A   Occupational History   ??? Not on file.   Social History Main Topics   ??? Smoking status: Never Smoker    ??? Smokeless tobacco: Never Used   ??? Alcohol Use: No   ??? Drug Use: No   ??? Sexually Active: Yes -- Female partner(s)     Birth Control/ Protection: Surgical   Other Topics Concern   ??? Not on file   Social History Narrative    ??? No narrative on file                    ALLERGIES: Codeine, Pcn, Prednisone, Ketorolac tromethamine, Shellfish, Iv dye, iodine containing, Influenza virus vaccine, Pneumovax 23, Morphine, Motrin and Ativan      Review of Systems   Unable to perform ROS: Mental status change   Psychiatric/Behavioral: Positive for confusion, agitation and altered mental status.       Filed Vitals:    09/21/09 0932 09/21/09 0947 09/21/09 0954 09/21/09 0958   BP:  147/68  158/73   Pulse: 128  118 128   Temp:       Resp:   14    Height:       Weight:       SpO2: 92%  98% 93%              Physical Exam   Nursing note and vitals  reviewed.  Constitutional: She is oriented to person, place, and time. She appears well-developed and well-nourished.   HENT:   Head: Normocephalic.   Eyes: Conjunctivae and EOM are normal. Pupils are equal, round, and reactive to light.   Neck: Normal range of motion.   Cardiovascular: Normal rate.    Pulmonary/Chest: Effort normal. No respiratory distress.   Abdominal: Soft. No tenderness.   Musculoskeletal: Normal range of motion.   Neurological: She is alert and oriented to person, place, and time.   Skin: Skin is warm and dry. No rash noted.   Psychiatric: Her mood appears anxious. Her speech is slurred. She is agitated. Cognition and memory are impaired. She expresses impulsivity.        MDM     Amount and/or Complexity of Data Reviewed:   Clinical lab tests:  Ordered and reviewed   Discuss the patient with another provider:  Yes  Risk of Significant Complications, Morbidity, and/or Mortality:   Presenting problems:  High  Diagnostic procedures:  High  Management options:  High  Critical Care:   Total time providing critical care:  30-74 minutes      Procedures

## 2009-09-21 NOTE — ED Notes (Signed)
Pt still fidgety in bed but is calmer with decreased stimulation and light off.

## 2009-09-21 NOTE — Consults (Addendum)
CONSULT NOTE    Krista Lopez    09/21/2009    Date of Admission:  09/21/2009      Subjective:     Patient is a 44 y.o. Caucasian female seen and evaluated at the request of Dr. Darlina Guys.  She presented to the ED on 09/20/09 with altered mental status.  She was agitated, delirious, confused, disoriented, uncooperative, trying to get out of bed, and unable to give hx. Nursing spoke with patient's boyfriend (with whom she lives) who reports that she recently was given pain medication for kidney stones and then additional pain medication for tooth extraction.  He states that she took Lortab and oxycodone last evening and began acting oddly - saying odd things and putting shoes in odd places.  He also reports that she has a history of drug abuse.  In the ED note "bath salts" are mentioned.  Patient's drug screen was positive only for Benzodiazepines.  Pt takes Coumadin, Klonopin, Imitrex chronically at home.   She has a history of Factor V Leiden mutation and PE and takes coumadin chronically.  Her INR is not therapeutic today.  She remains delirius, agitated, on Versed drip, and in 4 point restraints.      Patient Active Problem List   Diagnoses Code   ??? Pulmonary embolism, Recurrent 415.19AD   ??? Pleuritic chest pain 786.52D   ??? Factor V deficiency 286.3AY   ??? Depression 311L   ??? Anxiety 300.00E   ??? Patient noncompliance V15.81K   ??? Iron deficiency 275.09B   ??? Nephrolithiasis 592.0L   ??? Pulmonary embolism 415.19AD   ??? Delirium, acute 780.09EN   ??? Tachycardia 785.0H   ??? Bipolar 2 disorder 296.40M   ??? Migraine 346.90A   ??? Restless leg syndrome 333.94J   ??? Iron deficiency anemia 280.9AM         Home DME company - none.    Prior to Admission Medications   Medication Last Dose Informant Patient Reported? Taking?   hydrOXYzine (VISTARIL) 50 mg capsule   Yes No   Take 100 mg by mouth nightly.   warfarin (COUMADIN) 3 mg tablet   Yes No   Take 3 mg by mouth daily.   SUMATRIPTAN SUCCINATE (IMITREX PO)   Yes No    Take  by mouth.   KLONOPIN 1 mg Tab   Yes No   Take 1 mg by mouth three (3) times daily.   CYMBALTA 60 mg capsule   Yes No   Take 90 mg by mouth daily.          Review of Systems  Review of systems not obtained due to patient factors.    Past Medical History   Diagnosis Date   ??? 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 Infectious disease 02/2009      Hx MRSA / E.Coli bacteremia, MRSA wound infection       Past Surgical History   Procedure Date   ??? Cystoscopy ???   ??? Lithotripsy 2006   ??? Hx vascular access July, 2010     Power Port placed in [place], [place].   ??? Hx appendectomy ???   ??? Hx tonsillectomy    ??? Hx urological      cystoscopex13   ??? Hx cesarean section    ??? Hx tubal ligation        History   Social History   ??? Marital Status: Divorced  Spouse Name: N/A     Number of Children: N/A   ??? Years of Education: N/A   Occupational History   ??? Not on file.   Social History Main Topics   ??? Smoking status: Never Smoker    ??? Smokeless tobacco: Never Used   ??? Alcohol Use: No   ??? Drug Use: No      narcotic seeking per hospital hx.   ??? Sexually Active: Yes -- Female partner(s)     Birth Control/ Protection: Surgical   Other Topics Concern   ??? Not on file   Social History Narrative   ??? No narrative on file       Family History   Problem Relation Age of Onset   ??? Cancer Father      died from colon ca.   ??? Cancer Maternal Grandmother      metastatic breast CA.       Allergies   Allergen Reactions   ??? Codeine Rash   ??? Pcn (Penicillins) Hives   ??? Prednisone Rash   ??? Ketorolac Tromethamine Rash   ??? Shellfish Rash   ??? Iv Dye, Iodine Containing Rash   ??? Influenza Virus Vaccine Swelling     PT states she can take yearly flu shot   ??? Pneumovax 23 (Pneumococcal 23-valps Vaccine) Other (comments)     Local reaction   ??? Morphine Itching     Denies allergy today   ??? Motrin (Ibuprofen) Other (comments)     Causes stomach upset after 3 doses.   ??? Ativan (Lorazepam) Itching          Current facility-administered medications   Medication Dose Route Frequency   ??? haloperidol lactate (HALDOL) injection 10 mg  10 mg IntraMUSCular NOW   ??? midazolam (VERSED) injection 2 mg  2 mg IntraMUSCular NOW   ??? sodium chloride 0.9 % bolus infusion 1,000 mL  1,000 mL IntraVENous ONCE   ??? midazolam (VERSED) injection 2 mg  2 mg IntraMUSCular NOW   ??? midazolam (VERSED) injection 2 mg  2 mg IntraVENous NOW   ??? haloperidol lactate (HALDOL) injection 5 mg  5 mg IntraVENous NOW   ??? haloperidol lactate (HALDOL) injection 5 mg  5 mg IntraVENous NOW   ??? midazolam (VERSED) injection 2 mg  2 mg IntraVENous NOW   ??? etomidate (AMIDATE) 2 mg/mL injection 20 mg  20 mg IntraVENous ONCE   ??? midazolam in normal saline (VERSED) 1 mg/mL infusion  1-10 mg/hr IntraVENous CONTINUOUS   ??? ziprasidone (GEODON) 20 mg in sterile water (preservative free) injection  20 mg IntraMUSCular NOW   ??? sodium chloride 0.9 % bolus infusion 250 mL  250 mL IntraVENous ONCE   ??? sodium chloride 0.9 % bolus infusion 250 mL  250 mL IntraVENous ONCE   ??? warfarin (COUMADIN) tablet 3 mg  3 mg Oral QHS   ??? acetaminophen (TYLENOL) tablet 650 mg  650 mg Oral Q4H PRN   ??? acetaminophen (TYLENOL) suppository 650 mg  650 mg Rectal Q4H PRN   ??? HYDROmorphone (PF) (DILAUDID) injection 1 mg  1 mg IntraVENous Q4H PRN   ??? naloxone (NARCAN) injection 0.4 mg  0.4 mg IntraVENous PRN   ??? promethazine (PHENERGAN) injection 12.5 mg  12.5 mg IntraVENous Q6H PRN   ??? cefTRIAXone (ROCEPHIN) 1 g in 0.9% sodium chloride (MBP/ADV) 50 mL MBP  1 g IntraVENous Q24H   ??? vancomycin (VANCOCIN) 1.25 g in 0.9% sodium chloride 250 mL infusion  1,250 mg IntraVENous  Q12H   ??? ziprasidone (GEODON) 10 mg in sterile water (preservative free) injection  10 mg IntraMUSCular Q6H   ??? 0.9% sodium chloride infusion  100 mL/hr IntraVENous CONTINUOUS   ??? enoxaparin (LOVENOX) injection 80 mg  80 mg SubCUTAneous Q12H           Objective:     Filed Vitals:     09/21/09 1602 09/21/09 1617 09/21/09 1632 09/21/09 1635   BP: 154/73 128/95 159/101    Pulse: 121 126 132    Temp:       Resp: 19 32 29    Height:    5\' 4"  (1.626 m)   Weight:    167 lb 8.8 oz (76 kg)   SpO2: 99% 96% 86%        PHYSICAL EXAM     Gen- the patient is well developed and in no acute distress  HEENT- nose without epistaxis                  oral muscosa moist without cyanosis  Lungs- clear to auscultation, on 5 liters O2 via NC  Heart- RRR without M,G,R  Abd- soft and non-tender; with positive bowel sounds.  Ext- warm without cyanosis. There is no lower leg edema.  Skin- no jaundice or rashes, no wounds , R chest port without redness, swelling  Neuro- confused, delirious. No gross sensorimotor deficits are present.    Recent Labs   [facility] 09/21/09 1615 09/21/09 0950   ??? WBC 7.7 9.8   ??? HGB 9.5* 10.7*   ??? HCT 28.7* 32.1*   ??? PLT 170 216       Recent Labs   [facility] 09/21/09 1615 09/21/09 0950   ??? NA -- 141   ??? K -- 3.7   ??? CL -- 104   ??? GLU -- 142*   ??? CO2 -- 25   ??? BUN -- 10   ??? CREA -- 1.0   ??? MG -- --   ??? PHOS -- --   ??? TROIP -- --   ??? INR 1.1 1.1   ??? BNPP -- --       Assessment:  (Medical Decision Making)   Hospital Problems Date Reviewed: 10/07/2008      Class Noted    *Delirium, acute [780.09EN]  09/21/2009    Currently on Versed drip    Tachycardia [785.0H]  09/21/2009    sinus    Bipolar 2 disorder [296.21M] (Chronic)  09/21/2009    She was followed by psych          Plan:  (Medical Decision Making)   --Will start Geodon - scheduled and wean Versed  --start Lovenox at treatment dose - INR not therapeutic with history of Factor V and PE  --Decrease rate of IVF to 100 ml/hr  --check Mg++ and Phos and replace as necessary    Thank you very much for this referral.  We appreciate the opportunity to participate in this patient's care.  Will follow along with above stated plan.    Glo Herring, NP     I have spoken with and examined the patient. I have reviewed the history, examination, assessment, and plan and agree with the above note.     Alverda Skeans, MD

## 2009-09-21 NOTE — Progress Notes (Signed)
Pt to room 3111 and placed on monitor. Assessment completed, see doc flow sheets. Pt remains in a state of delirium, she is able to state her name but is otherwise confused, mumbling incomprehensible words often. Pt also appears to have hallucinations, reaching for objects and talking to people who are not present. Pt remains restrained for safety, bed locked in lowest position. Pt remains tachycardic at this time. Will continue to monitor closely.

## 2009-09-21 NOTE — ED Notes (Signed)
Pt very fidgety. Pt states " took 3 lortabs 7.5 mg, 4-5 klonopins this morning." unable to find out when this am

## 2009-09-21 NOTE — ED Notes (Signed)
Lab in to draw 2nd blood culture. 

## 2009-09-21 NOTE — ED Notes (Signed)
Stepped out to get medication out for Dr. Constance Goltz. Pt got out off both hand restraints and climbed out of bed. Pt placed back in bed restraints replaced. Pt yelling. Pt nad

## 2009-09-21 NOTE — ED Notes (Signed)
Pt removed pulse ox with her teeth.

## 2009-09-21 NOTE — Progress Notes (Signed)
Problem: Falls - Risk of  Goal: *Knowledge of fall prevention  Patient restrained for safety.  Patient resting and will follow commands but needs constant direction.  Will continue to reinforce interventions to decrease fall risk.

## 2009-09-21 NOTE — ED Notes (Signed)
CT head neg  Agitation and delirium persist.  Pt required total 20 mg etomidate to sedate for CT scan  No etiology for her symptoms apparent yet.  ?bath salts or other non-detectable drug

## 2009-09-21 NOTE — ED Notes (Signed)
Pt starting to try to climb out of bed again, taking off her oxygen. Pt states "needs razor to shave her eye lashes and legs"

## 2009-09-21 NOTE — ED Notes (Signed)
Pt up on bed pan. Urine sample collected

## 2009-09-21 NOTE — ED Notes (Signed)
Pt back from CT. Pt awake and restless. Pt back on monitor. Rich tech at bedside. Pt nad

## 2009-09-21 NOTE — ED Notes (Signed)
Dr. Olson at bedside

## 2009-09-21 NOTE — ED Notes (Signed)
Continues to struggle with great effort against restraints. Attempts to bite her IV, to pull off monitoring equipment and restraints with her teeth. Takes continuous staff interaction to prevent her from injuring herself. She states she has bruising on her arms from "the ribbons" but will not rest and not struggle. Dr. Constance Goltz at beside.

## 2009-09-22 LAB — METABOLIC PANEL, BASIC
Anion gap: 11 mmol/L (ref 7–16)
BUN: 5 MG/DL — ABNORMAL LOW (ref 6–23)
CO2: 22 MMOL/L — ABNORMAL LOW (ref 23–32)
Calcium: 8.8 MG/DL (ref 8.3–10.4)
Chloride: 110 MMOL/L — ABNORMAL HIGH (ref 98–107)
Creatinine: 0.7 MG/DL (ref 0.6–1.5)
GFR est AA: >60 mL/min/{1.73_m2} (ref >60)
GFR est non-AA: >60 mL/min/{1.73_m2} (ref >60)
Glucose: 92 MG/DL (ref 65–100)
Potassium: 3.7 MMOL/L (ref 3.5–5.1)
Sodium: 143 MMOL/L (ref 136–145)

## 2009-09-22 LAB — PROTHROMBIN TIME + INR
INR: 1.1 (ref 0.9–1.2)
Prothrombin time: 10.7 SECS (ref 9.4–10.8)

## 2009-09-22 LAB — CBC W/O DIFF
HCT: 31.2 % — ABNORMAL LOW (ref 35.8–46.3)
HGB: 10.2 g/dL — ABNORMAL LOW (ref 11.7–15.4)
MCH: 31.7 PG (ref 26.1–32.9)
MCHC: 32.7 g/dL (ref 31.4–35.0)
MCV: 96.9 FL (ref 79.6–97.8)
MPV: 9.8 FL — ABNORMAL LOW (ref 10.8–14.1)
PLATELET: 170 10*3/uL (ref 150–450)
RBC: 3.22 M/uL — ABNORMAL LOW (ref 4.05–5.25)
RDW: 13.4 % (ref 11.9–14.6)
WBC: 8.2 10*3/uL (ref 4.3–11.1)

## 2009-09-22 LAB — MAGNESIUM: Magnesium: 2.5 MG/DL — ABNORMAL HIGH (ref 1.8–2.4)

## 2009-09-22 LAB — BNP: BNP: 299 pg/mL

## 2009-09-22 MED ADMIN — vancomycin (VANCOCIN) 1.25 g in 0.9% sodium chloride 250 mL infusion: INTRAVENOUS | @ 09:00:00 | NDC 63323031461

## 2009-09-22 MED ADMIN — dexmedetomidine (PRECEDEX) 200 mcg in 0.9% sodium chloride 50 mL infusion: INTRAVENOUS | @ 02:00:00 | NDC 00409798436

## 2009-09-22 MED ADMIN — dexmedetomidine (PRECEDEX) 200 mcg in 0.9% sodium chloride 50 mL infusion: INTRAVENOUS | @ 03:00:00 | NDC 71288050503

## 2009-09-22 MED ADMIN — 0.9% sodium chloride infusion: INTRAVENOUS | @ 14:00:00 | NDC 00409798309

## 2009-09-22 MED ADMIN — enoxaparin (LOVENOX) injection 80 mg: SUBCUTANEOUS | @ 09:00:00 | NDC 00075062280

## 2009-09-22 MED ADMIN — duloxetine (CYMBALTA) capsule 60 mg: ORAL | @ 14:00:00 | NDC 00002327001

## 2009-09-22 MED ADMIN — warfarin (COUMADIN) tablet 7.5 mg: ORAL | NDC 00056017675

## 2009-09-22 MED ADMIN — acetaminophen (TYLENOL) tablet 650 mg: ORAL | @ 16:00:00 | NDC 00904198261

## 2009-09-22 MED ADMIN — 0.9% sodium chloride infusion: INTRAVENOUS | @ 08:00:00 | NDC 00409798309

## 2009-09-22 MED ADMIN — dexmedetomidine (PRECEDEX) 200 mcg in 0.9% sodium chloride 50 mL infusion: INTRAVENOUS | @ 07:00:00 | NDC 00409798436

## 2009-09-22 MED ADMIN — ziprasidone (GEODON) 10 mg in sterile water (preservative free) 1.2 mL injection: INTRAMUSCULAR | @ 07:00:00 | NDC 63323018510

## 2009-09-22 MED ADMIN — magnesium sulfate 2 g/50 ml IVPB (premix): INTRAVENOUS | NDC 00409672924

## 2009-09-22 MED ADMIN — dexmedetomidine (PRECEDEX) 200 mcg in 0.9% sodium chloride 50 mL infusion: INTRAVENOUS | @ 01:00:00 | NDC 71288050503

## 2009-09-22 MED ADMIN — HYDROmorphone (PF) (DILAUDID) injection 1 mg: INTRAVENOUS | @ 13:00:00 | NDC 00409255201

## 2009-09-22 MED ADMIN — mupirocin (BACTROBAN) 2 % ointment: TOPICAL | @ 13:00:00 | NDC 00168035222

## 2009-09-22 MED ADMIN — hydrocodone-acetaminophen (NORCO) 5-325 mg per tablet 1 Tab: ORAL | @ 17:00:00 | NDC 00406036562

## 2009-09-22 MED ADMIN — 0.9% sodium chloride infusion: INTRAVENOUS | @ 12:00:00 | NDC 00409798309

## 2009-09-22 MED ADMIN — hydrocodone-acetaminophen (NORCO) 5-325 mg per tablet 1 Tab: ORAL | NDC 00406036562

## 2009-09-22 MED ADMIN — dexmedetomidine (PRECEDEX) 200 mcg in 0.9% sodium chloride 50 mL infusion: INTRAVENOUS | @ 12:00:00 | NDC 71288050503

## 2009-09-22 MED ADMIN — ziprasidone (GEODON) 10 mg in sterile water (preservative free) 1.2 mL injection: INTRAMUSCULAR | NDC 63323018510

## 2009-09-22 MED ADMIN — HYDROmorphone (PF) (DILAUDID) injection 1 mg: INTRAVENOUS | @ 10:00:00 | NDC 00409255201

## 2009-09-22 MED ADMIN — warfarin (COUMADIN) tablet 5 mg: ORAL | @ 02:00:00 | NDC 00056017201

## 2009-09-22 NOTE — Progress Notes (Signed)
Pt no c/o at present.

## 2009-09-22 NOTE — Progress Notes (Signed)
TRANSFER - IN REPORT:    Verbal report received from Foye Clock RN on Krista Lopez  being received from ICU for routine progression of care      Report consisted of patient???s Situation, Background, Assessment and   Recommendations(SBAR).     Information from the following report(s) SBAR, Kardex and MAR was reviewed with the receiving nurse.    Opportunity for questions and clarification was provided.      Assessment completed upon patient???s arrival to unit and care assumed.

## 2009-09-22 NOTE — Consults (Signed)
Request for consult received.  Will be seeing her this afternoon.  Thanks.

## 2009-09-22 NOTE — Progress Notes (Signed)
Dilaudid 1 mg IV given for level 6 pain in left hip.  Awake now and oriented, follows commands. Sips of water given.

## 2009-09-22 NOTE — Progress Notes (Signed)
TRANSFER - OUT REPORT:    Verbal report given to Fredderick Phenix RN(name) on Krista Lopez  being transferred to Room 601(unit) on remote telemetry for routine progression of care       Report consisted of patient???s Situation, Background, Assessment and   Recommendations(SBAR).     Information from the following report(s) SBAR, Kardex, ED Summary, Intake/Output and MAR was reviewed with the receiving nurse.    Opportunity for questions and clarification was provided.

## 2009-09-22 NOTE — Progress Notes (Signed)
Daily Progress Note: 09/22/2009 10:09 AM    Alert ,awake, fully oriented. Reports cymbalta dose increase from 60 mg to 90 mg 6 wks ago  And 2 days ago started on roxicodone post tooth extraction.    Denies any other illicit drug use. No recent fevers, cough, sputum,dysuria or HA etc.          Objective:   Vitals:  BP 124/63   Pulse 106   Temp 98.6 ??F (37 ??C)   Resp 25   Ht 5\' 4"  (1.626 m)   Wt 171 lb 4.8 oz (77.7 kg)   BMI 29.40 kg/m2   SpO2 97%   LMP 07/28/2004 O2 Flow Rate (L/min): 3 l/min O2 Device: Nasal cannula    Temp (24hrs), Avg:98.8 ??F (37.1 ??C), Min:98 ??F (36.7 ??C), Max:99.5 ??F (37.5 ??C)      Intake / Output:  In: 11 [I.V.:11]  Out: -   In: 1450 [I.V.:1450]  Out: 2875 [Urine:2875]    Physical Exam:   General appearance: alert, cooperative, no distress, appears stated age   Head: Normocephalic, without obvious abnormality, atraumatic   Eyes: conjunctivae/corneas clear. PERRL, EOM's intact. Fundi benign   Neck: supple, symmetrical, trachea midline, no adenopathy, thyroid: not enlarged, symmetric, no tenderness/mass/nodules, no carotid bruit and no JVD   Lungs: clear to auscultation bilaterally   Heart: regular rate and rhythm, S1, S2 normal, no murmur, click, rub or gallop   Abdomen: soft, non-tender. Bowel sounds normal. No masses,  no organomegaly   Extremities: extremities normal, atraumatic, no cyanosis or edema   Skin: Skin color, texture, turgor normal. No rashes or lesions   Neurologic: Grossly normal      Additional comments:I reviewed the patient's new clinical lab test results.     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   Port in place.   Prior hx of MRSA Bacteremia.   Bipolar.   PLAN;     Delirium resolved,  Continue IV fluids.  Will hold further ATB,cxs negative.  Transfer to remote tele.  Agree with lower dose cymbalta.  Will avoid narcs    Agree with prophy dose lovenox.and coumadin higher than home dose.  Will transfer to remote tele.  Discharge am if stable  Ambulate pt    Appreciate Dr.Butta's *s help and f/u.          Care Plan discussed with: pt/nursing.}    Total time spent with patient: 35 minutes.

## 2009-09-22 NOTE — Progress Notes (Signed)
Instructed not to get OOB without staff assist, pt verbalized understanding.

## 2009-09-22 NOTE — Progress Notes (Signed)
Call to Dr. Darlina Guys regarding pt's request for pain meds, orders received and pt states she can take lortab.

## 2009-09-22 NOTE — Progress Notes (Signed)
Bedside shift report received from Juanna Cao RN. Assessment completed and documented, drowsy but awakens when name called and oriented to person and place.  Precedex IV decreased to 0.1 mcg/kg/hr at this time.  Denies pain, lying on right side.

## 2009-09-22 NOTE — Progress Notes (Signed)
Dr. Darlina Guys in to see patient.  Updated that Geodon ws held this AM as patient was oriented and calm now.    Complete bed bath given with Hibiclens per protocol for MRSA.  Bed pad and gown changed.

## 2009-09-22 NOTE — Progress Notes (Signed)
Pharmacokinetic Consult to Pharmacist    Krista Lopez is a 44 y.o. female being treated for AMS (hx of MRSA bacteremia and drug abuse) with rocephin and vancomycin.    Height: 5\' 4"  (162.6 cm)  Weight: 77.7 kg (171 lb 4.8 oz)  Lab Results   Component Value Date/Time    BUN 5 09/22/09 04:15 AM    Creatinine 0.7 09/22/09 04:15 AM    WBC 8.2 09/22/09 03:00 AM        CrCl ~ 100 ml.min    CULTURES:  Blood cx X2 - ngtd    Goal trough is 15-20.  Vancomycin dose initiated at 1.25g Iv q 12 hours.   Will continue to follow patient.      Thank you,  Hulan Amato, PharmD  Clinical pharmacist  [phone]

## 2009-09-22 NOTE — Consults (Signed)
I am familiar with this patient from her encounter in ED and presentation there.  The fact that she is calm, less agitated, and more appropriate so soon after her wild, resistant psychosis and delirium accompanied by tachycardia, severe polydrug dependence and negative UDS sort of leads me to this being yet another case of "bath salts" ingestion.  "Bath salts" are not really "bath salts" but are called that because they are sold in little clear plastic wrappers and look like clear, little beads just like you would see at [facility].  But they are sold at convenience stores, head shops, tobacco stores, etc.  On the labeling it mentions not meant to ingest internally.  Of course, that is what it was intended for.  It can be snorted, swallowed, and injected--any route and it does predictably the same thing.  It is a sympathomimetic like crystal meth, but significantly different enough structurally that will not cross-react in a UDS as methamphetamine or amphetamine.   It is a different substance altogether (kind of like other designer drugs) and therefore, no existing legislation prohibiting it exists, except for just recently in a very few states.  There seem to be two different sources and each package may have a different blend or single compound.  One is related to a stimulant often used in [place] and [place].  It is an extremely potent stimulant in this form which is very prone to cause autonomic instability, delirium, psychosis, and agitation.  Technically, the DEA is able under law to declare any substance as a "new drug" as being controlled or illicit substances.  This power was given to the DEA by the Controlled Substance Act after the phenomenon of club drugs and ecstasy hit the scene.  Another recent news item was K-2, which as late as last year was widespread, completely legal, and sold in the same type of stores and "marketed" as herbal incense which was nothing more than leaves impregnated  with a "super THC" way more potent than pot.  Again, on the label it would plainly say, "do not ingest internally [hint, hint]."  It did not show up in UDS.  It is very addictive.  I saw many college students who really decompensated, flunked out and couldn't quit the stuff.  Within the last couple of months we have seen numerous cases here in [place] similar to this--and across the country EDs are flooded with new cases.  They are notoriously difficult to get to de-escalate or respond to tranquilization.    At any rate, they often improved dramatically within 24 hrs once the drug leaves their system.  Recidivism is high.  It is said to be a very exhilirating high and from what I have heard when I interview my patients is that it's better than crack and better than oxycontin and superior to methamphetamine.    Notoriously, patients are not forthcoming when asked about ingestion of drugs, either flat out lying, rationalizing, or minimization or some elaborate story of a laced drink or laced "oxy's."  Of course, without any positives on UDS you just never know for sure.    With respect to this patient--from the psychiatric standpoint the patient is stable, not psychotic, manic, suicidal or homicidal, not delirious; with sufficient judgment and insight to know what to do to make healthcare decisions, but whether she does it or not is a different matter.  I will furnish her with some information about our outpatient addiction program, specifically attempt to engage  her on the issue she admits, the opiates.

## 2009-09-22 NOTE — Progress Notes (Signed)
Dr. Lona Kettle in to see patient, new orders received.

## 2009-09-22 NOTE — Progress Notes (Signed)
Updated Dr. Lona Kettle that patient is now more oriented and calm, new orders received.

## 2009-09-22 NOTE — Progress Notes (Signed)
Critical Care Daily Progress Note: 09/22/2009    Krista Lopez   Admission Date: 09/21/2009              Subjective:     Overall much better today. Alert and awake , follow commands and want her usual meds, off Precidex now    Current facility-administered medications   Medication Dose Route Frequency   ??? ziprasidone (GEODON) 10 mg in sterile water (preservative free) 1.2 mL injection  10 mg IntraMUSCular Q6H PRN   ??? sodium chloride 0.9 % bolus infusion 1,000 mL  1,000 mL IntraVENous ONCE   ??? midazolam (VERSED) injection 2 mg  2 mg IntraVENous NOW   ??? haloperidol lactate (HALDOL) injection 5 mg  5 mg IntraVENous NOW   ??? haloperidol lactate (HALDOL) injection 5 mg  5 mg IntraVENous NOW   ??? midazolam (VERSED) injection 2 mg  2 mg IntraVENous NOW   ??? etomidate (AMIDATE) 2 mg/mL injection 20 mg  20 mg IntraVENous ONCE   ??? midazolam in normal saline (VERSED) 1 mg/mL infusion  1-10 mg/hr IntraVENous CONTINUOUS   ??? ziprasidone (GEODON) 20 mg in sterile water (preservative free) injection  20 mg IntraMUSCular NOW   ??? sodium chloride 0.9 % bolus infusion 250 mL  250 mL IntraVENous ONCE   ??? sodium chloride 0.9 % bolus infusion 250 mL  250 mL IntraVENous ONCE   ??? acetaminophen (TYLENOL) tablet 650 mg  650 mg Oral Q4H PRN   ??? acetaminophen (TYLENOL) suppository 650 mg  650 mg Rectal Q4H PRN   ??? HYDROmorphone (PF) (DILAUDID) injection 1 mg  1 mg IntraVENous Q4H PRN   ??? naloxone (NARCAN) injection 0.4 mg  0.4 mg IntraVENous PRN   ??? cefTRIAXone (ROCEPHIN) 1 g in 0.9% sodium chloride (MBP/ADV) 50 mL MBP  1 g IntraVENous Q24H   ??? vancomycin (VANCOCIN) 1.25 g in 0.9% sodium chloride 250 mL infusion  1,250 mg IntraVENous Q12H   ??? 0.9% sodium chloride infusion  100 mL/hr IntraVENous CONTINUOUS   ??? enoxaparin (LOVENOX) injection 80 mg  80 mg SubCUTAneous Q12H   ??? warfarin (COUMADIN) tablet 5 mg  5 mg Oral QHS   ??? magnesium sulfate 2 g/50 ml IVPB (premix)   2 g IntraVENous ONCE    ??? mupirocin (BACTROBAN) 2 % ointment   Topical DAILY         Review of Systems  A comprehensive review of systems was negative except for that written in the HPI.    Objective:   Filed Vitals:    09/22/09 0730 09/22/09 0800 09/22/09 0900 09/22/09 0918   BP: 114/62 104/51 124/63    Pulse: 105 104 106    Temp: 98.6 ??F (37 ??C)      Resp: 21 37 33 25   Height:       Weight:       SpO2: 98%  92% 97%         Intake and Output:   In: 1450 [I.V.:1450]  Out: 2875 [Urine:2875]  In: 11 [I.V.:11]  Out: -     Physical Exam:          GEN: well developed and in no acute distress,   HEENT:  no epistaxis, oral mucosa moist without cyanosis,   NECK:  no JVD  LUNGS: clear  HEART:  RRR with no M,G,R;  ABDOMEN:  soft with no tenderness; positive bowel sounds present  EXTREMITIES:  warm with no cyanosis, no lower leg edema  SKIN:  no jaundice  or ecchymosis   NEURO:  alert and oriented, grossly non-focal    LINES:  port    DRIPS:   none      LAB  Recent Labs   [facility] 09/22/09 0300 09/21/09 1615 09/21/09 0950   ??? WBC 8.2 7.7 9.8   ??? HGB 10.2* 9.5* 10.7*   ??? HCT 31.2* 28.7* 32.1*   ??? PLT 170 170 216       Recent Labs   Basename 09/22/09 0415 09/22/09 0300 09/21/09 1615 09/21/09 0950   ??? NA 143 -- 143 141   ??? K 3.7 -- 3.6 3.7   ??? CL 110* -- 108* 104   ??? CO2 22* -- 26 25   ??? GLU 92 -- 92 142*   ??? BUN 5* -- 8 10   ??? CREA 0.7 -- 0.8 1.0   ??? MG -- 2.5* 1.4* --   ??? PHOS -- -- 2.3* --   ??? TROIP -- -- <0.04* --   ??? INR -- 1.1 1.1 1.1   ??? 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

## 2009-09-22 NOTE — Progress Notes (Signed)
Dual skin assessment completed by this RN and Donnal Moat.  Bruising noted to BLE, and small scratch to lt arm.  Tattoo on lower back.  Pt requesting pain meds.

## 2009-09-23 LAB — METABOLIC PANEL, BASIC
Anion gap: 8 mmol/L (ref 7–16)
BUN: 5 MG/DL — ABNORMAL LOW (ref 6–23)
CO2: 27 MMOL/L (ref 23–32)
Calcium: 9.4 MG/DL (ref 8.3–10.4)
Chloride: 107 MMOL/L (ref 98–107)
Creatinine: 0.8 MG/DL (ref 0.6–1.5)
GFR est AA: >60 mL/min/{1.73_m2} (ref >60)
GFR est non-AA: >60 mL/min/{1.73_m2} (ref >60)
Glucose: 104 MG/DL — ABNORMAL HIGH (ref 65–100)
Potassium: 3.5 MMOL/L (ref 3.5–5.1)
Sodium: 142 MMOL/L (ref 136–145)

## 2009-09-23 LAB — CBC WITH AUTOMATED DIFF
ABS. BASOPHILS: 0 10*3/uL (ref 0.0–0.2)
ABS. EOSINOPHILS: 0.4 10*3/uL (ref 0.0–0.8)
ABS. IMM. GRANS.: 0 10*3/uL (ref 0.0–2.0)
ABS. LYMPHOCYTES: 1.3 10*3/uL (ref 0.5–4.6)
ABS. MONOCYTES: 0.6 10*3/uL (ref 0.1–1.3)
ABS. NEUTROPHILS: 3.4 10*3/uL (ref 1.7–8.2)
BASOPHILS: 0 % (ref 0.0–2.0)
EOSINOPHILS: 7 % (ref 0.5–7.8)
HCT: 30.3 % — ABNORMAL LOW (ref 35.8–46.3)
HGB: 10.1 g/dL — ABNORMAL LOW (ref 11.7–15.4)
IMMATURE GRANULOCYTES: 0.2 % (ref 0.0–2.0)
LYMPHOCYTES: 22 % (ref 13–44)
MCH: 32 PG (ref 26.1–32.9)
MCHC: 33.3 g/dL (ref 31.4–35.0)
MCV: 95.9 FL (ref 79.6–97.8)
MONOCYTES: 11 % (ref 4.0–12.0)
MPV: 9.7 FL — ABNORMAL LOW (ref 10.8–14.1)
NEUTROPHILS: 60 % (ref 43–78)
PLATELET: 194 10*3/uL (ref 150–450)
RBC: 3.16 M/uL — ABNORMAL LOW (ref 4.05–5.25)
RDW: 12.9 % (ref 11.9–14.6)
WBC: 5.6 10*3/uL (ref 4.3–11.1)

## 2009-09-23 LAB — T3, FREE: T3,FREE: 3.1 pg/mL (ref 2.4–4.2)

## 2009-09-23 LAB — PROTHROMBIN TIME + INR
INR: 1.2 (ref 0.9–1.2)
Prothrombin time: 11.8 SECS — ABNORMAL HIGH (ref 9.4–10.8)

## 2009-09-23 MED ADMIN — oxycodone (ROXICODONE) tablet 5 mg: ORAL | @ 21:00:00 | NDC 68084035411

## 2009-09-23 MED ADMIN — warfarin (COUMADIN) tablet 7.5 mg: ORAL | @ 02:00:00 | NDC 76282033010

## 2009-09-23 MED ADMIN — clonazepam (KLONOPIN) tablet 1 mg: ORAL | @ 04:00:00 | NDC 00228300411

## 2009-09-23 MED ADMIN — hydrocodone-acetaminophen (NORCO) 5-325 mg per tablet 1 Tab: ORAL | @ 07:00:00 | NDC 00406036562

## 2009-09-23 MED ADMIN — sodium chloride 0.9 % bolus infusion 500 mL: INTRAVENOUS | @ 21:00:00 | NDC 00409798309

## 2009-09-23 MED ADMIN — duloxetine (CYMBALTA) capsule 60 mg: ORAL | @ 14:00:00 | NDC 00002327001

## 2009-09-23 MED ADMIN — enoxaparin (LOVENOX) injection 40 mg: SUBCUTANEOUS | @ 14:00:00 | NDC 00075062040

## 2009-09-23 MED ADMIN — mupirocin (BACTROBAN) 2 % ointment: TOPICAL | @ 14:00:00 | NDC 00168035222

## 2009-09-23 MED ADMIN — 0.9% sodium chloride infusion: INTRAVENOUS | @ 22:00:00 | NDC 00409798309

## 2009-09-23 MED ADMIN — hydrocodone-acetaminophen (NORCO) 5-325 mg per tablet 1 Tab: ORAL | @ 15:00:00 | NDC 00406036562

## 2009-09-23 MED ADMIN — ondansetron (ZOFRAN) injection 4 mg: INTRAVENOUS | @ 21:00:00 | NDC 00143989105

## 2009-09-23 MED ADMIN — clonazepam (KLONOPIN) tablet 1 mg: ORAL | @ 21:00:00 | NDC 00228300411

## 2009-09-23 MED ADMIN — clonazepam (KLONOPIN) tablet 1 mg: ORAL | @ 14:00:00 | NDC 00228300411

## 2009-09-23 MED ADMIN — cefTRIAXone (ROCEPHIN) 1 g in 0.9% sodium chloride (MBP/ADV) 50 mL MBP: INTRAVENOUS | @ 15:00:00 | NDC 00781320885

## 2009-09-23 NOTE — Progress Notes (Signed)
NO CHANGE IN PT CONDITION DURING ROUNDS. REPORT TO GWEN.

## 2009-09-23 NOTE — Progress Notes (Signed)
Pt reports full relief from nausea med.

## 2009-09-23 NOTE — Progress Notes (Signed)
PT C/O NAUSEA. ZOFRAN  4 MG IV GIVEN.

## 2009-09-23 NOTE — Progress Notes (Addendum)
Daily Progress Note: 09/23/2009 10:09 AM    C/o chils, bodyache.    Denies any other illicit drug use or port manipulation.. Requestng increased pain med.      Objective:   Vitals:  BP 138/88   Pulse 115   Temp 98.9 ??F (37.2 ??C)   Resp 18   Ht 5\' 4"  (1.626 m)   Wt 171 lb 4.8 oz (77.7 kg)   BMI 29.40 kg/m2   SpO2 98%   LMP 07/28/2004 O2 Flow Rate (L/min): 1 l/min O2 Device: Nasal cannula    Temp (24hrs), Avg:99.3 ??F (37.4 ??C), Min:98.9 ??F (37.2 ??C), Max:100 ??F (37.8 ??C)      Intake / Output:  In: 295 [P.O.:240; I.V.:55]  Out: -   In: 3344 [P.O.:1380; I.V.:1964]  Out: 1500 [Urine:1500]    Physical Exam:   General appearance: alert, cooperative, no distress, appears stated age   Head: Normocephalic, without obvious abnormality, atraumatic   Eyes: conjunctivae/corneas clear. PERRL, EOM's intact. Fundi benign   Neck: supple, symmetrical, trachea midline, no adenopathy, thyroid: not enlarged, symmetric, no tenderness/mass/nodules, no carotid bruit and no JVD   Lungs: clear to auscultation bilaterally   Heart: regular rate and rhythm, S1, S2 normal, no murmur, click, rub or gallop   Abdomen: soft, non-tender. Bowel sounds normal. No masses,  no organomegaly   Extremities: extremities normal, atraumatic, no cyanosis or edema   Skin: Skin color, texture, turgor normal. No rashes or lesions   Neurologic: Grossly normal      Additional comments:I reviewed the patient's new clinical lab test results.     Assessment/Plan:   GM Negative rods in blood, Port specimen,  Chills, myalgia.  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   Port in place.   Prior hx of MRSA Bacteremia.   Bipolar.     PLAN;   Check rectal temp.  Follow cxs.  Check CXR ? Not done yet  Restarted Ceftriaxone.  Delirium resolved,  Continue IV fluids.  Watch BP  Continue  remote tele.  On  lower dose cymbalta.   will adjust pain meds prn   Adjust coumadin.  Appreciate Dr.Butta's *s help and f/u.  Care Plan discussed with: pt/nursing.}    Total time spent with patient: 35 minutes.

## 2009-09-23 NOTE — Progress Notes (Signed)
Critical Result Notification    Received and verbally repeated the following test results Blood Culture, positive GNR from [facility] lab tech on 09/23/09, at 805.      Dr Darlina Guys was notified and provided a verbal readback of the results listed above on 09/23/09 at 815.  Orders were received at this time.    Additional comments:    Ethelene Hal, RN

## 2009-09-23 NOTE — Progress Notes (Signed)
NO C/O NAUSEA.

## 2009-09-23 NOTE — Progress Notes (Signed)
Gave zofran 4 mg iv ps for c/o nausea.

## 2009-09-23 NOTE — Progress Notes (Signed)
PT NO C/O AT PRESENT.

## 2009-09-23 NOTE — Progress Notes (Addendum)
Krista Lopez  Admission Date: 09/21/2009             Daily Progress Note: 09/23/2009  Subjective:     Patient has been moved from ICU to floor bed.  C/O painin her left ear lobe.    Breathing without difficulty       Current facility-administered medications   Medication Dose Route Frequency   ??? cefTRIAXone (ROCEPHIN) 1 g in 0.9% sodium chloride (MBP/ADV) 50 mL MBP  1 g IntraVENous Q24H   ??? enoxaparin (LOVENOX) injection 40 mg  40 mg SubCUTAneous Q24H   ??? ziprasidone (GEODON) 10 mg in sterile water (preservative free) 1.2 mL injection  10 mg IntraMUSCular Q6H PRN   ??? duloxetine (CYMBALTA) capsule 60 mg  60 mg Oral DAILY   ??? warfarin (COUMADIN) tablet 7.5 mg  7.5 mg Oral QHS   ??? hydrocodone-acetaminophen (NORCO) 5-325 mg per tablet 1 Tab  1 Tab Oral Q6H PRN   ??? clonazepam (KLONOPIN) tablet 1 mg  1 mg Oral TID   ??? acetaminophen (TYLENOL) suppository 650 mg  650 mg Rectal Q4H PRN   ??? mupirocin (BACTROBAN) 2 % ointment   Topical DAILY       Review of Systems  Pertinent items are noted in HPI.    Objective:     Filed Vitals:    09/23/09 0448 09/23/09 0605 09/23/09 0748 09/23/09 1342   BP: 139/87  159/91 138/88   Pulse: 93  93 115   Temp: 100 ??F (37.8 ??C) 99.3 ??F (37.4 ??C) 98.9 ??F (37.2 ??C) 98.9 ??F (37.2 ??C)   Resp: 18   18   Height:       Weight:       SpO2: 92%  97% 98%       Intake and Output:   In: 3344 [P.O.:1380; I.V.:1964]  Out: 1500 [Urine:1500]  In: 295 [P.O.:240; I.V.:55]  Out: -     Physical Exam:  (per Dr. Lona Kettle)        GEN: well developed and in no acute distress, Oxygen per 1 liter O2  HEENT:  no epistaxis, oral mucosa moist without cyanosis, Ear - clear TM bilateral  LUNGS:  Clear to auscultation  HEART:  RRR with no M,G,R;  ABDOMEN:  soft with no tenderness; positive bowel sounds present  EXTREMITIES:  warm with no cyanosis, no lower leg edema  SKIN:  no jaundice or ecchymosis , R chest port without redness, swelling  NEURO:  alert and oriented, grossly non-focal    CHEST XRAY:      8/12 CT head: IMPRESSION: No acute intracranial fundings.      LAB  Recent Labs   Basename 09/23/09 0600 09/22/09 0300 09/21/09 1615 09/21/09 0950   ??? WBC 5.6 8.2 7.7 9.8   ??? HGB 10.1* 10.2* 9.5* 10.7*   ??? HCT 30.3* 31.2* 28.7* 32.1*   ??? PLT 194 170 170 216       Recent Labs   Basename 09/23/09 0600 09/22/09 0415 09/22/09 0300 09/21/09 1615   ??? NA 142 143 -- 143   ??? K 3.5 3.7 -- 3.6   ??? CL 107 110* -- 108*   ??? CO2 27 22* -- 26   ??? GLU 104* 92 -- 92   ??? BUN 5* 5* -- 8   ??? CREA 0.8 0.7 -- 0.8   ??? MG -- -- 2.5* 1.4*   ??? PHOS -- -- -- 2.3*   ??? TROIP -- -- -- <0.04*   ???  INR 1.2 -- 1.1 1.1   ??? BNPP -- -- 299 --       MICRO:   8/12 nasal: (+) MRSA  8/12 BC: Gm stain: NOS   Cx: GNR with ID pending (1 of 2 from port)    Assessment:  (Medical Decision Making)   Hospital Problems Date Reviewed: 09/23/2009      Class Noted    Blood bacterial culture positive - 1 of 2 with GNR (from port - Gm stain negative) [790.7B]  09/23/2009    ? 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]  09/22/2009    Recent increase in Cymbalta dose with addition of new pain medications prior to admission    Personal history of PE (pulmonary embolism), with Factor V mutation [V12.9F]  09/22/2009    Patient on coumadin long-term for history of Factor V with PE - INR not therapeutic on admission  Currently on subcutaneous Lovenox and coumadin  INR today 1.2    *Delirium, acute [780.09EN]  09/21/2009    resolved    Tachycardia [785.0H]  09/21/2009    Remains slightly tachycardic rate 93-118    Bipolar 2 disorder [296.76M] (Chronic)  09/21/2009    On Cymbalta and Klonopin  Psych to see today          Plan:  (Medical Decision Making)     --repeat blood cultures - ? If current blood culture is contaminant  --Rocephin 1 - continue until ID of above culture.  No other s/s of infection - WBC are WNL, afebrile    --No further respiratory issues identified.  Will sign off.  Please call for further assistance.     Alverda Skeans, MD

## 2009-09-24 LAB — CBC WITH AUTOMATED DIFF
ABS. BASOPHILS: 0 10*3/uL (ref 0.0–0.2)
ABS. EOSINOPHILS: 0.3 10*3/uL (ref 0.0–0.8)
ABS. IMM. GRANS.: 0 10*3/uL (ref 0.0–2.0)
ABS. LYMPHOCYTES: 1.7 10*3/uL (ref 0.5–4.6)
ABS. MONOCYTES: 0.6 10*3/uL (ref 0.1–1.3)
ABS. NEUTROPHILS: 1.9 10*3/uL (ref 1.7–8.2)
BASOPHILS: 0 % (ref 0.0–2.0)
EOSINOPHILS: 6 % (ref 0.5–7.8)
HCT: 31.1 % — ABNORMAL LOW (ref 35.8–46.3)
HGB: 10.2 g/dL — ABNORMAL LOW (ref 11.7–15.4)
IMMATURE GRANULOCYTES: 0.2 % (ref 0.0–2.0)
LYMPHOCYTES: 38 % (ref 13–44)
MCH: 31.5 PG (ref 26.1–32.9)
MCHC: 32.8 g/dL (ref 31.4–35.0)
MCV: 96 FL (ref 79.6–97.8)
MONOCYTES: 13 % — ABNORMAL HIGH (ref 4.0–12.0)
MPV: 9.8 FL — ABNORMAL LOW (ref 10.8–14.1)
NEUTROPHILS: 43 % (ref 43–78)
PLATELET: 202 10*3/uL (ref 150–450)
RBC: 3.24 M/uL — ABNORMAL LOW (ref 4.05–5.25)
RDW: 12.9 % (ref 11.9–14.6)
WBC: 4.4 10*3/uL (ref 4.3–11.1)

## 2009-09-24 LAB — PROTHROMBIN TIME + INR
INR: 1.4 — ABNORMAL HIGH (ref 0.9–1.2)
Prothrombin time: 13.7 SECS — ABNORMAL HIGH (ref 9.4–10.8)

## 2009-09-24 MED ADMIN — ondansetron (ZOFRAN) injection 4 mg: INTRAVENOUS | @ 01:00:00 | NDC 00143989105

## 2009-09-24 MED ADMIN — oxycodone (ROXICODONE) tablet 5 mg: ORAL | @ 21:00:00 | NDC 68084035411

## 2009-09-24 MED ADMIN — cefTRIAXone (ROCEPHIN) 1 g in 0.9% sodium chloride (MBP/ADV) 50 mL MBP: INTRAVENOUS | @ 15:00:00 | NDC 00781320885

## 2009-09-24 MED ADMIN — oxycodone (ROXICODONE) tablet 5 mg: ORAL | @ 01:00:00 | NDC 68084035411

## 2009-09-24 MED ADMIN — 0.9% sodium chloride infusion: INTRAVENOUS | @ 04:00:00 | NDC 00409798309

## 2009-09-24 MED ADMIN — ondansetron (ZOFRAN) injection 4 mg: INTRAVENOUS | @ 17:00:00 | NDC 00143989105

## 2009-09-24 MED ADMIN — oxycodone (ROXICODONE) tablet 5 mg: ORAL | @ 09:00:00 | NDC 68084035411

## 2009-09-24 MED ADMIN — oxycodone (ROXICODONE) tablet 5 mg: ORAL | @ 13:00:00 | NDC 68084035411

## 2009-09-24 MED ADMIN — 0.9% sodium chloride infusion: INTRAVENOUS | @ 10:00:00 | NDC 00409798309

## 2009-09-24 MED ADMIN — ondansetron (ZOFRAN) injection 4 mg: INTRAVENOUS | @ 13:00:00 | NDC 00143989105

## 2009-09-24 MED ADMIN — ondansetron (ZOFRAN) injection 4 mg: INTRAVENOUS | @ 09:00:00 | NDC 00143989105

## 2009-09-24 MED ADMIN — warfarin (COUMADIN) tablet 7.5 mg: ORAL | @ 01:00:00 | NDC 00056017675

## 2009-09-24 MED ADMIN — enoxaparin (LOVENOX) injection 40 mg: SUBCUTANEOUS | @ 10:00:00 | NDC 00075062040

## 2009-09-24 MED ADMIN — clonazepam (KLONOPIN) tablet 1 mg: ORAL | @ 21:00:00 | NDC 00228300411

## 2009-09-24 MED ADMIN — clonazepam (KLONOPIN) tablet 1 mg: ORAL | @ 01:00:00 | NDC 00228300411

## 2009-09-24 MED ADMIN — mupirocin (BACTROBAN) 2 % ointment: TOPICAL | @ 13:00:00 | NDC 00168035222

## 2009-09-24 MED ADMIN — oxycodone (ROXICODONE) tablet 5 mg: ORAL | @ 17:00:00 | NDC 68084035411

## 2009-09-24 MED ADMIN — clonazepam (KLONOPIN) tablet 1 mg: ORAL | @ 13:00:00 | NDC 00228300411

## 2009-09-24 MED ADMIN — duloxetine (CYMBALTA) capsule 60 mg: ORAL | @ 13:00:00 | NDC 00002327001

## 2009-09-24 MED ADMIN — ondansetron (ZOFRAN) injection 4 mg: INTRAVENOUS | @ 21:00:00 | NDC 00143989105

## 2009-09-24 MED ADMIN — mupirocin (BACTROBAN) 2 % ointment: TOPICAL | @ 21:00:00 | NDC 00168035222

## 2009-09-24 MED ADMIN — 0.9% sodium chloride infusion: INTRAVENOUS | @ 19:00:00 | NDC 00409798309

## 2009-09-24 MED ADMIN — acetaminophen (TYLENOL) suppository 650 mg: RECTAL | @ 04:00:00 | NDC 00713016550

## 2009-09-24 NOTE — Progress Notes (Signed)
NO C/O NAUSEA.

## 2009-09-24 NOTE — Progress Notes (Signed)
Chaplain participated in interdisciplinary rounds.    Chaplain Adrian D. Wilks, M. Div.

## 2009-09-24 NOTE — Progress Notes (Signed)
Notified Dr. Everlene Farrier of pt L ear pain.  No new orders at this time.

## 2009-09-24 NOTE — Progress Notes (Addendum)
Daily Progress Note: 09/24/2009 10:09 AM    C/o intermittent chills..    Denied any other illicit drug use or port manipulation..       Objective:   Vitals:  BP 148/85   Pulse 103   Temp 97.8 ??F (36.6 ??C)   Resp 18   Ht 5\' 4"  (1.626 m)   Wt 171 lb 4.8 oz (77.7 kg)   BMI 29.40 kg/m2   SpO2 97%   LMP 07/28/2004 O2 Flow Rate (L/min): 2 l/min O2 Device: Nasal cannula    Temp (24hrs), Avg:98.6 ??F (37 ??C), Min:97.8 ??F (36.6 ??C), Max:99.5 ??F (37.5 ??C)      Intake / Output:     In: 3763 [P.O.:840; I.V.:2923]  Out: -     Physical Exam:   General appearance: alert, cooperative, no distress, appears stated age   Head: Normocephalic, without obvious abnormality, atraumatic   Eyes: conjunctivae/corneas clear. PERRL, EOM's intact. Fundi benign   Neck: supple, symmetrical, trachea midline, no adenopathy, thyroid: not enlarged, symmetric, no tenderness/mass/nodules, no carotid bruit and no JVD   Lungs: clear to auscultation bilaterally   Heart: regular rate and rhythm, S1, S2 normal, no murmur, click, rub or gallop   Abdomen: soft, non-tender. Bowel sounds normal. No masses,  no organomegaly   Extremities: extremities normal, atraumatic, no cyanosis or edema   Skin: Skin color, texture, turgor normal. No rashes or lesions   Neurologic: Grossly normal      Additional comments:I reviewed the patient's new clinical lab test results.     Assessment/Plan:   GM Negative rods in blood, Port specimen,( negative BL CX ARD specimen post 2 does ceftriaxone)  Prior hx of rec port infecetion  Chills, myalgia.  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   Port in place.   Prior hx of MRSA Bacteremia.   Bipolar.     PLAN;   TTE ok.  Follow cxs.  Cont  Ceftriaxone.#3  If definite infection noted will consider ID opinion on PORT use.  This is her 3 rd port.  Delirium resolved,  Continue IV fluids.   Still mild tachycardia,        Care Plan discussed with: pt/nursing.}    Total time spent with patient: 25 minutes.

## 2009-09-24 NOTE — Progress Notes (Signed)
PT C/O NAUSEA. ZOFRAN 4 MG IV GIVEN.

## 2009-09-24 NOTE — Progress Notes (Signed)
Pt c/o nausea, no emesis noted. Zofran 4 mg IV adm.

## 2009-09-24 NOTE — Progress Notes (Signed)
NO CHANGE IN PT CONDITION DURING ROUNDS. REPORT TO KIM,RN.

## 2009-09-24 NOTE — Progress Notes (Signed)
[facility] Pt. Last Name: Lopez  [facility] Pt. First Name: Krista [address] MR#: [id number] / Admit#: [id number]   [place], [place] [address] DOB: 13-Feb-1965 / Age: 44  Attn.: Montez Morita  Location: 06 - [id number]        Case Management - Progress Note  Initial Open Date: 09/24/2009   Case Manager: Erlene Quan, LISW-CP    Initial Open Date: 09/24/2009  Social Worker: Erlene Quan LISW-CP    Expected Date of Discharge:   Transferred From: Home  ECF Bed Held Until:   Bed Held By:     Power of Attorney:   POA/Guardian/Conservator Capacity:   Primary Caregiver:   Living Arrangements: Own Home    Source of Income: Social Security Disability  Payee:   Psychosocial History:   Cultural/Religious/Language Issues:   Education Level:   ADLS/Current Living Arrangements Issues: Pt is currently living at home and   plans to return home at d/c. Pt is independent in her ADLs.    Past Providers:     Will patient perform self care at discharge? Y    Anticipated Discharge Disposition Goal: Return to admission address    Assessment/Plan:   09/24/2009 12:13P SW met with pt. Pt is 44 yof here due to acute delirium.   Pt was alert and oriented x4. Pt denied d/c needs and stated her co-pays for   her medications are affordable. SW will follow and assist as d/c needs arise.   Erlene Quan, LISW-CP       Resources at Discharge:           Service Providers at Discharge:

## 2009-09-24 NOTE — Progress Notes (Signed)
Problem: Interdisciplinary Rounds  Goal: Interdisciplinary Rounds  Outcome: Progressing Towards Goal  Interdisciplinary team rounds were held 09/24/2009 with the following team members:Care Management, Nursing and Pastoral Care and the patient.    Plan of care discussed. See clinical pathway and/or care plan for interventions and desired outcomes.    No discharge needs identified.

## 2009-09-24 NOTE — Progress Notes (Signed)
Voices slight decrease in nausea.

## 2009-09-24 NOTE — Progress Notes (Signed)
Pt remains tearful.  Pt requests that I call the doctor for more pain medication.  Paged doctor.

## 2009-09-24 NOTE — Progress Notes (Signed)
Pt tearful with c/o L ear pain.  Medicated per MAR.

## 2009-09-25 LAB — PROTHROMBIN TIME + INR
INR: 1.6 — ABNORMAL HIGH (ref 0.9–1.2)
Prothrombin time: 16.4 SECS — ABNORMAL HIGH (ref 9.4–10.8)

## 2009-09-25 MED ADMIN — 0.9% sodium chloride infusion: INTRAVENOUS | @ 01:00:00 | NDC 00409798309

## 2009-09-25 MED ADMIN — ondansetron (ZOFRAN) injection 4 mg: INTRAVENOUS | @ 21:00:00 | NDC 00143989105

## 2009-09-25 MED ADMIN — cefTRIAXone (ROCEPHIN) 1 g in 0.9% sodium chloride (MBP/ADV) 50 mL MBP: INTRAVENOUS | @ 16:00:00 | NDC 00781320885

## 2009-09-25 MED ADMIN — warfarin (COUMADIN) tablet 7.5 mg: ORAL | @ 01:00:00 | NDC 00056017201

## 2009-09-25 MED ADMIN — oxycodone (ROXICODONE) tablet 5 mg: ORAL | @ 21:00:00 | NDC 68084035411

## 2009-09-25 MED ADMIN — 0.9% sodium chloride infusion: INTRAVENOUS | @ 10:00:00 | NDC 00409798309

## 2009-09-25 MED ADMIN — ondansetron (ZOFRAN) injection 4 mg: INTRAVENOUS | @ 17:00:00 | NDC 00143989105

## 2009-09-25 MED ADMIN — 0.9% sodium chloride infusion: INTRAVENOUS | @ 20:00:00 | NDC 00409798309

## 2009-09-25 MED ADMIN — ondansetron (ZOFRAN) injection 4 mg: INTRAVENOUS | @ 13:00:00 | NDC 00143989105

## 2009-09-25 MED ADMIN — metronidazole (FLAGYL) tablet 500 mg: ORAL | @ 20:00:00 | NDC 63739017610

## 2009-09-25 MED ADMIN — acetaminophen (TYLENOL) tablet 650 mg: ORAL | @ 01:00:00 | NDC 00904198261

## 2009-09-25 MED ADMIN — mupirocin (BACTROBAN) 2 % ointment: TOPICAL | @ 13:00:00 | NDC 00168035222

## 2009-09-25 MED ADMIN — oxycodone (ROXICODONE) tablet 5 mg: ORAL | @ 01:00:00 | NDC 68084035411

## 2009-09-25 MED ADMIN — oxycodone (ROXICODONE) tablet 5 mg: ORAL | @ 06:00:00 | NDC 68084035411

## 2009-09-25 MED ADMIN — ondansetron (ZOFRAN) injection 4 mg: INTRAVENOUS | @ 01:00:00 | NDC 00143989105

## 2009-09-25 MED ADMIN — enoxaparin (LOVENOX) injection 40 mg: SUBCUTANEOUS | @ 09:00:00 | NDC 00075062040

## 2009-09-25 MED ADMIN — clonazepam (KLONOPIN) tablet 1 mg: ORAL | @ 01:00:00 | NDC 00228300411

## 2009-09-25 MED ADMIN — oxycodone (ROXICODONE) tablet 5 mg: ORAL | @ 17:00:00 | NDC 68084035411

## 2009-09-25 MED ADMIN — oxycodone (ROXICODONE) tablet 5 mg: ORAL | @ 13:00:00 | NDC 68084035411

## 2009-09-25 MED ADMIN — clonazepam (KLONOPIN) tablet 1 mg: ORAL | @ 13:00:00 | NDC 00228300411

## 2009-09-25 MED ADMIN — duloxetine (CYMBALTA) capsule 60 mg: ORAL | @ 13:00:00 | NDC 00002327001

## 2009-09-25 MED ADMIN — clonazepam (KLONOPIN) tablet 1 mg: ORAL | @ 20:00:00 | NDC 00228300411

## 2009-09-25 MED ADMIN — ondansetron (ZOFRAN) injection 4 mg: INTRAVENOUS | @ 06:00:00 | NDC 00143989105

## 2009-09-25 MED ADMIN — mupirocin (BACTROBAN) 2 % ointment: TOPICAL | @ 21:00:00 | NDC 00168035222

## 2009-09-25 MED ADMIN — mupirocin (BACTROBAN) 2 % ointment: TOPICAL | @ 22:00:00 | NDC 81033002099

## 2009-09-25 NOTE — Progress Notes (Signed)
Voices decreased nausea.

## 2009-09-25 NOTE — Progress Notes (Signed)
Pt c/o nausea, no emesis noted. Zofran 4 mg IV adm.

## 2009-09-25 NOTE — Progress Notes (Signed)
Hospitalist Progress Note    Subjective:   Daily Progress Note: 09/25/2009 1:33 PM    Pt awake, alert and appropriate. C/o pain in left ear.  Denies CP or SOB but reports some DOE on ambulation to the bathroom. C/o diarrhea. C/o headache     Current facility-administered medications   Medication Dose Route Frequency   ??? metronidazole (FLAGYL) tablet 500 mg  500 mg Oral Q8H   ??? mupirocin (BACTROBAN) 2 % ointment   Topical BID   ??? acetaminophen (TYLENOL) tablet 650 mg  650 mg Oral Q4H PRN   ??? cefTRIAXone (ROCEPHIN) 1 g in 0.9% sodium chloride (MBP/ADV) 50 mL MBP  1 g IntraVENous Q24H   ??? oxycodone (ROXICODONE) tablet 5 mg  5 mg Oral Q4H PRN   ??? ondansetron (ZOFRAN) injection 4 mg  4 mg IntraVENous Q4H PRN   ??? 0.9% sodium chloride infusion  125 mL/hr IntraVENous CONTINUOUS   ??? enoxaparin (LOVENOX) injection 40 mg  40 mg SubCUTAneous Q24H   ??? ziprasidone (GEODON) 10 mg in sterile water (preservative free) 1.2 mL injection  10 mg IntraMUSCular Q6H PRN   ??? duloxetine (CYMBALTA) capsule 60 mg  60 mg Oral DAILY   ??? warfarin (COUMADIN) tablet 7.5 mg  7.5 mg Oral QHS   ??? clonazepam (KLONOPIN) tablet 1 mg  1 mg Oral TID   ??? acetaminophen (TYLENOL) suppository 650 mg  650 mg Rectal Q4H PRN          Review of Systems  A comprehensive review of systems was negative except for that written in the HPI.    Objective:     BP 141/77   Pulse 86   Temp 98 ??F (36.7 ??C)   Resp 16   Ht 5\' 4"  (1.626 m)   Wt 171 lb 4.8 oz (77.7 kg)   BMI 29.40 kg/m2   SpO2 97%   LMP 07/28/2004 O2 Flow Rate (L/min): 2 l/min O2 Device: Nasal cannula    Temp (24hrs), Avg:98.8 ??F (37.1 ??C), Min:97.8 ??F (36.6 ??C), Max:100.1 ??F (37.8 ??C)      In: 240 [P.O.:240]  Out: -   In: 5187 [P.O.:480; I.V.:4707]  Out: -     General appearance: alert, cooperative, no distress, appears stated age  Ears: normal TM's and external ear canals right ear - without abnormality.  Left ear with small ulceration and old dried blood but no other abnormalities.   Lungs: clear to auscultation bilaterally  Heart: regular rate and rhythm, S1, S2 normal  Abdomen: soft, non-tender. Bowel sounds normal. No masses,  no organomegaly  Extremities: extremities normal, atraumatic, no cyanosis or edema  Psych: A+Ox 3    Additional comments:I reviewed the patient's new clinical lab test results. CXR - new right lung infiltrate    Data Review    Recent Results (from the past 24 hour(s))   PROTHROMBIN TIME    Collection Time    09/25/09  5:25 AM   Component Value Range   ??? Prothrombin Time-PT 16.4 (*) 9.4 - 10.8 (SECS)   ??? INR 1.6 (*) 0.9 - 1.2 ( )           Assessment/Plan:     Patient Active Hospital Problem List:  *Delirium, acute (09/21/2009)resolved    Tachycardia (09/21/2009)resolved    Bipolar 2 disorder (09/21/2009)    Serotonin syndrome, most likely (09/22/2009)agitation after cymbalta dose increased.  Dose reduced. Agitation has resolved.    Personal history of PE (pulmonary embolism), with Factor V mutation (09/22/2009)was still  on lovenox injections at home and just started on coumadin.  She still has 6 injections at home.  INR on coumadin is 1.6    Blood bacterial culture positive - 1 of 2 with GNR (from port - Gm stain negative) (09/23/2009) - still awaiting ID and sens.  Wbc down but still has fevers.  Follow up culture results    Pneumonia, organism unspecified (09/25/2009)maybe due to aspiration due to prior agitation.  Will add anaerobic coverage.  Chose flagyl since pt also has diarrhea.    Plan: home soon as afebrile.    Care Plan discussed with: Patient/Family and Nurse    Total time spent with patient: 20   minutes.

## 2009-09-25 NOTE — Progress Notes (Signed)
Pt medicated for L ear pain 7/10.  Pt with c/o L inner ear swelling.  Left ear does not appear visibly swollen.  Medicated per [facility] for pain.

## 2009-09-25 NOTE — Progress Notes (Signed)
Dr. Audria Nine informed of pt's c/o L ear pain.

## 2009-09-25 NOTE — Progress Notes (Signed)
Pt c/o nausea, no emesis noted.  Zofran 4 mg IV adm.

## 2009-09-25 NOTE — Progress Notes (Signed)
Pt c/o nausea, no emesis noted. Zofran 4 mg iv adm.  Pt c/o H/A and L ear pain. Roxicodone pot adm.  Pt reports blood on Q-tio when she cleaned her ear during the night.  No blood observed at present.

## 2009-09-26 LAB — METABOLIC PANEL, BASIC
Anion gap: 8 mmol/L (ref 7–16)
BUN: 8 MG/DL (ref 6–23)
CO2: 26 MMOL/L (ref 23–32)
Calcium: 9.4 MG/DL (ref 8.3–10.4)
Chloride: 105 MMOL/L (ref 98–107)
Creatinine: 0.9 MG/DL (ref 0.6–1.5)
GFR est AA: >60 mL/min/{1.73_m2} (ref >60)
GFR est non-AA: >60 mL/min/{1.73_m2} (ref >60)
Glucose: 99 MG/DL (ref 65–100)
Potassium: 3.7 MMOL/L (ref 3.5–5.1)
Sodium: 139 MMOL/L (ref 136–145)

## 2009-09-26 LAB — CULTURE, BLOOD: Culture result:: NO GROWTH

## 2009-09-26 LAB — CBC W/O DIFF
HCT: 31.3 % — ABNORMAL LOW (ref 35.8–46.3)
HGB: 10.3 g/dL — ABNORMAL LOW (ref 11.7–15.4)
MCH: 31.8 PG (ref 26.1–32.9)
MCHC: 32.9 g/dL (ref 31.4–35.0)
MCV: 96.6 FL (ref 79.6–97.8)
MPV: 9.9 FL — ABNORMAL LOW (ref 10.8–14.1)
PLATELET: 233 10*3/uL (ref 150–450)
RBC: 3.24 M/uL — ABNORMAL LOW (ref 4.05–5.25)
RDW: 12.8 % (ref 11.9–14.6)
WBC: 5.3 10*3/uL (ref 4.3–11.1)

## 2009-09-26 LAB — PROTHROMBIN TIME + INR
INR: 1.7 — ABNORMAL HIGH (ref 0.9–1.2)
Prothrombin time: 17.4 SECS — ABNORMAL HIGH (ref 9.4–10.8)

## 2009-09-26 MED ORDER — ENOXAPARIN 120 MG/0.8 ML SUB-Q SYRINGE
120 mg/0.8 mL | INJECTION | SUBCUTANEOUS | Status: AC
Start: 2009-09-26 — End: 2009-10-04

## 2009-09-26 MED ORDER — LEVOFLOXACIN 500 MG TAB
500 mg | ORAL_TABLET | Freq: Every day | ORAL | Status: AC
Start: 2009-09-26 — End: 2009-10-06

## 2009-09-26 MED ORDER — DULOXETINE 60 MG CAP, DELAYED RELEASE
60 mg | ORAL_CAPSULE | Freq: Every day | ORAL | Status: DC
Start: 2009-09-26 — End: 2011-01-09

## 2009-09-26 MED ORDER — WARFARIN 5 MG TAB
5 mg | ORAL_TABLET | Freq: Every day | ORAL | Status: DC
Start: 2009-09-26 — End: 2010-07-26

## 2009-09-26 MED ORDER — ACETAMINOPHEN 325 MG TABLET
325 mg | ORAL_TABLET | Freq: Four times a day (QID) | ORAL | Status: DC | PRN
Start: 2009-09-26 — End: 2010-07-26

## 2009-09-26 MED ADMIN — ondansetron (ZOFRAN) injection 4 mg: INTRAVENOUS | @ 09:00:00 | NDC 00143989105

## 2009-09-26 MED ADMIN — duloxetine (CYMBALTA) capsule 60 mg: ORAL | @ 13:00:00 | NDC 00002327001

## 2009-09-26 MED ADMIN — ondansetron (ZOFRAN) injection 4 mg: INTRAVENOUS | @ 05:00:00 | NDC 00143989105

## 2009-09-26 MED ADMIN — ondansetron (ZOFRAN) injection 4 mg: INTRAVENOUS | @ 01:00:00 | NDC 00781301095

## 2009-09-26 MED ADMIN — 0.9% sodium chloride infusion: INTRAVENOUS | @ 03:00:00 | NDC 00409798309

## 2009-09-26 MED ADMIN — ondansetron (ZOFRAN) injection 4 mg: INTRAVENOUS | @ 16:00:00 | NDC 00143989105

## 2009-09-26 MED ADMIN — oxycodone (ROXICODONE) tablet 5 mg: ORAL | @ 13:00:00 | NDC 68084035411

## 2009-09-26 MED ADMIN — clonazepam (KLONOPIN) tablet 1 mg: ORAL | @ 13:00:00 | NDC 00228300411

## 2009-09-26 MED ADMIN — enoxaparin (LOVENOX) injection 120 mg: SUBCUTANEOUS | @ 20:00:00 | NDC 00075291201

## 2009-09-26 MED ADMIN — metronidazole (FLAGYL) tablet 500 mg: ORAL | @ 03:00:00 | NDC 51079012601

## 2009-09-26 MED ADMIN — oxycodone (ROXICODONE) tablet 5 mg: ORAL | @ 09:00:00 | NDC 68084035411

## 2009-09-26 MED ADMIN — enoxaparin (LOVENOX) injection 40 mg: SUBCUTANEOUS | @ 09:00:00 | NDC 00075062040

## 2009-09-26 MED ADMIN — warfarin (COUMADIN) tablet 7.5 mg: ORAL | @ 01:00:00 | NDC 00056017201

## 2009-09-26 MED ADMIN — mupirocin (BACTROBAN) 2 % ointment: TOPICAL | @ 13:00:00 | NDC 45802011222

## 2009-09-26 MED ADMIN — cefTRIAXone (ROCEPHIN) 1 g in 0.9% sodium chloride (MBP/ADV) 50 mL MBP: INTRAVENOUS | @ 15:00:00 | NDC 64679098302

## 2009-09-26 MED ADMIN — clonazepam (KLONOPIN) tablet 1 mg: ORAL | @ 01:00:00 | NDC 00228300411

## 2009-09-26 MED ADMIN — oxycodone (ROXICODONE) tablet 5 mg: ORAL | @ 18:00:00 | NDC 68084035411

## 2009-09-26 MED ADMIN — oxycodone (ROXICODONE) tablet 5 mg: ORAL | @ 05:00:00 | NDC 68084035411

## 2009-09-26 MED ADMIN — 0.9% sodium chloride infusion: INTRAVENOUS | @ 09:00:00 | NDC 00409798309

## 2009-09-26 MED ADMIN — metronidazole (FLAGYL) tablet 500 mg: ORAL | @ 13:00:00 | NDC 51079012601

## 2009-09-26 MED ADMIN — oxycodone (ROXICODONE) tablet 5 mg: ORAL | @ 01:00:00 | NDC 68084035411

## 2009-09-26 NOTE — Discharge Summary (Signed)
Physician Discharge Summary     Patient ID:  Krista Lopez  [id number]  44 y.o.  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 Apparently paramedics were called by  people who suspected having drug overdose. However, when they arrived, nobody  was available. Apparently no drug bottles or alcohol was found nearby. In the ED, she required multiple doses of IV Haldol, versed adn geodon without effect and was eventually placed on versed drip.   She was tachycardic and with low grade temperature on admission.  She had a normal WBC with left shift.  INR was 1.1 on coumadin.  Electrolytes and renal function were normal.    Urine toxicology screen positive only for benzodiazepines with reported irregular use of Klonopin at home.  CT head without contrast was obtained and showed no acute intracranial abnormalities.    She was admitted to ICU and maintained on versed gtt which was  weaned. She improved dramatically over the course of one day. She was seen in consultation by Psychiatry, who suggested possible ingestion of "bath salts" with features similar to methamphetamine but does not cross react with drug screen.  Other diagnosis in the differential was serotonin syndrome, due to a recent increase in her cymbalta dose from 60mg  to 90mg . However, her symptoms resolved so quickly and while still on 60mg  that I believe this diagnosis is less likely.  Ms. Hegna denies ingestion of bath salts but admits to using them in her bath water and states "I will just use liquid soap from now on.".  Ms. Kearn's coumadin dose has been adjusted due to subtherapeutic INR and she will continue therapeutic dose lovenox 1.5mg /kg/day until INR therapeutic. She has been instructed to have close followup of her INR on Friday, especially since she is also discharged on lovenox to cover for probable aspiration pneumonia with RLL infiltrate. She was noted to have GNR in one of two blood cultures. Culture from port is negative at time of discharge and port site looks good.  She has been counselled to avoid substances such as bath salts. She is anxious to go home today and is discharged home in stable condition.        PCP: Dr. Johny Drilling    Consults: psychiatry and pulmonary  Significant Diagnostic Studies:   Examination: CHEST PA LATERAL - [id number] - Sep 23 2009 4:52PM  Reason: fever, r/o pna  REPORT:  Two-view chest x-ray.  CLINICAL INDICATION: Fever, evaluate for pneumonia.  FINDINGS: 2 views of the chest show poorly defined airspace opacity in the   right midlung near the hila that is new when compared to a prior exam dated   08/15/2009. An infectious or inflammatory process must be considered. No   pleural effusion or pneumothorax noted.  IMPRESSION: New patchy airspace opacity in the right mid lung near the hila    may represent infectious or inflammatory process.     2D ECHO 09/24/2009  SUMMARY:  - Left ventricle: Systolic function was normal. Ejection fraction was   estimated  to be greater than 55 %. There were no regional wall motion abnormalities.        Disposition: home    Patient Instructions:   Current Discharge Medication List      START taking these medications       acetaminophen (TYLENOL) 325 mg tablet    Take 2 Tabs by mouth every six (6) hours as needed for Pain.    Qty: 30 Tab Refills: 0        enoxaparin (LOVENOX) 120 mg/0.8 mL injection    120 mg by SubCUTAneous route every twenty-four (24) hours for 7 days.    Qty: 7 Syringe Refills: 1        levofloxacin (LEVAQUIN) 500 mg tablet    Take 1 Tab by mouth daily for 10 days.    Qty: 10 Tab Refills: 0          CONTINUE these medications which have CHANGED       duloxetine (CYMBALTA) 60 mg capsule    Take 1 Cap by mouth daily.    Qty: 30 Cap Refills: 0        warfarin (COUMADIN) 5 mg tablet    Take 1.5 Tabs by mouth daily.    Qty: 45 Tab Refills: 0          CONTINUE these medications which have NOT CHANGED       hydrOXYzine (VISTARIL) 50 mg capsule    Take 100 mg by mouth nightly.        SUMATRIPTAN SUCCINATE (IMITREX PO)    Take  by mouth.        KLONOPIN 1 mg Tab    Take 1 mg by mouth three (3) times daily.            Activity: activity as tolerated  Diet: Regular Diet      Follow-up with Dr. Johny Drilling and [facility] as scheduled.  INR this Friday 09/28/2009 with results to Dr. Johny Drilling.   >30 minutes for patient care and discharge    Signed:  Carman Ching, MD  09/26/2009  3:48 PM

## 2009-09-26 NOTE — Progress Notes (Signed)
Hospitalist Progress Note    Subjective:   Daily Progress Note: 09/26/2009 3:25 PM    Patient has been anxious for discharge today.  No new complaints. Some mild dyspnea with exertion. Has been associated with cough and low grade fever prior to admission.      Current facility-administered medications   Medication Dose Route Frequency   ??? metronidazole (FLAGYL) tablet 500 mg  500 mg Oral Q8H   ??? mupirocin (BACTROBAN) 2 % ointment   Topical BID   ??? acetaminophen (TYLENOL) tablet 650 mg  650 mg Oral Q4H PRN   ??? cefTRIAXone (ROCEPHIN) 1 g in 0.9% sodium chloride (MBP/ADV) 50 mL MBP  1 g IntraVENous Q24H   ??? oxycodone (ROXICODONE) tablet 5 mg  5 mg Oral Q4H PRN   ??? ondansetron (ZOFRAN) injection 4 mg  4 mg IntraVENous Q4H PRN   ??? 0.9% sodium chloride infusion  125 mL/hr IntraVENous CONTINUOUS   ??? enoxaparin (LOVENOX) injection 40 mg  40 mg SubCUTAneous Q24H   ??? ziprasidone (GEODON) 10 mg in sterile water (preservative free) 1.2 mL injection  10 mg IntraMUSCular Q6H PRN   ??? duloxetine (CYMBALTA) capsule 60 mg  60 mg Oral DAILY   ??? warfarin (COUMADIN) tablet 7.5 mg  7.5 mg Oral QHS   ??? clonazepam (KLONOPIN) tablet 1 mg  1 mg Oral TID   ??? acetaminophen (TYLENOL) suppository 650 mg  650 mg Rectal Q4H PRN          Objective:     BP 134/74   Pulse 100   Temp 99.7 ??F (37.6 ??C)   Resp 20   Ht 5\' 4"  (1.626 m)   Wt 171 lb 4.8 oz (77.7 kg)   BMI 29.40 kg/m2   SpO2 96%   LMP 07/28/2004 O2 Flow Rate (L/min): 2 l/min O2 Device: Room air    Temp (24hrs), Avg:99.1 ??F (37.3 ??C), Min:98.5 ??F (36.9 ??C), Max:99.7 ??F (37.6 ??C)      In: 1566 [P.O.:480; I.V.:1086]  Out: -   In: 6135 [P.O.:1480; I.V.:4655]  Out: -     General appearance: alert, cooperative, no distress, appears stated age  Lungs: clear to auscultation bilaterally  Heart: regular rate and rhythm, S1, S2 normal  Abdomen: soft, non-tender. Bowel sounds normal. No masses,  no organomegaly  Extremities: extremities normal, atraumatic, no cyanosis or edema  Psych: A+Ox 3       Data Review    Recent Results (from the past 24 hour(s))   CBC W/O DIFF    Collection Time    09/26/09  5:40 AM   Component Value Range   ??? WBC 5.3  4.3 - 11.1 (K/uL)   ??? RBC 3.24 (*) 4.05 - 5.25 (M/uL)   ??? HGB 10.3 (*) 11.7 - 15.4 (g/dL)   ??? HCT 31.3 (*) 35.8 - 46.3 (%)   ??? MCV 96.6  79.6 - 97.8 (FL)   ??? MCH 31.8  26.1 - 32.9 (PG)   ??? MCHC 32.9  31.4 - 35.0 (g/dL)   ??? RDW 12.8  11.9 - 14.6 (%)   ??? PLATELET 233  150 - 450 (K/uL)   ??? MPV 9.9 (*) 10.8 - 14.1 (FL)   METABOLIC PANEL, BASIC    Collection Time    09/26/09  5:40 AM   Component Value Range   ??? Sodium 139  136 - 145 (MMOL/L)   ??? Potassium 3.7  3.5 - 5.1 (MMOL/L)   ??? Chloride 105  98 - 107 (MMOL/L)   ???  CO2 26  23 - 32 (MMOL/L)   ??? Anion gap 8  7 - 16 (mmol/L)   ??? Glucose 99  65 - 100 (MG/DL)   ??? BUN 8  6 - 23 (MG/DL)   ??? Creatinine 0.9  0.6 - 1.5 (MG/DL)   ??? GFR est AA >60  >60 (ml/min/1.95m2)   ??? GFR est non-AA >60  >60 (ml/min/1.50m2)   ??? Calcium 9.4  8.3 - 10.4 (MG/DL)   PROTHROMBIN TIME    Collection Time    09/26/09  5:40 AM   Component Value Range   ??? Prothrombin Time-PT 17.4 (*) 9.4 - 10.8 (SECS)   ??? INR 1.7 (*) 0.9 - 1.2 ( )           Assessment/Plan:     Patient Active Hospital Problem List:  *Delirium, acute (09/21/2009)resolved - suspect most likely related to substance abuse - highly suspicious for "bath salts" per Psychiatry with classic sx.  Rapid recovery less consistent with serotonin syndrome. Also resolved while still on cymbalta.  Tachycardia (09/21/2009)resolved  Bipolar 2 disorder (09/21/2009)  Serotonin syndrome, most likely (09/22/2009) - see above  Personal history of PE (pulmonary embolism), with Factor V mutation (09/22/2009) - INR on higher dose of coumadin at 1.7.  Will get INR on Friday.  Continue lovenox until therapeutic.   Blood bacterial culture positive - 1 of 2 with GNR (from port - Gm stain negative) (09/23/2009) - still awaiting ID and sens.  Complete 14 days of levaquin. Repeat culture negative.   Pneumonia, organism unspecified (09/25/2009)maybe due to aspiration due to prior agitation.      Home today with followup as above.    Care Plan discussed with: Patient

## 2009-09-26 NOTE — Progress Notes (Signed)
Discharge instructions reviewd with pt, who verbalized understanding.  Copies were given.  lovenox shot ordered pt pre-discharge given.  Chest wall life port packed with heparin and de-accessed.  Pt discharged to home.

## 2009-09-27 LAB — CULTURE, BLOOD: GRAM STAIN: NONE SEEN

## 2009-09-28 LAB — CULTURE, BLOOD/ARD: Culture result:: NO GROWTH

## 2010-07-26 NOTE — ED Provider Notes (Signed)
Patient is a 45 y.o. female presenting with chest pain and shortness of breath. [name]   Chest Pain   Associated symptoms include shortness of breath.   Shortness of Breath  Associated symptoms include chest pain.        Past Medical History   Diagnosis Date   ??? 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 Infectious disease 02/2009      Hx MRSA / E.Coli bacteremia, MRSA wound infection   ??? Factor V Leiden mutation         Past Surgical History   Procedure Date   ??? Cystoscopy ???   ??? Lithotripsy 2006   ??? Hx vascular access July, 2010     Power Port placed in [place], [place] removed   ??? Hx appendectomy ???   ??? Hx tonsillectomy    ??? Hx urological      [id number]   ??? Hx cesarean section    ??? Hx tubal ligation          Family History   Problem Relation Age of Onset   ??? Cancer Father      died from colon ca.   ??? Cancer Maternal Grandmother      metastatic breast CA.        History     Social History   ??? Marital Status: Divorced     Spouse Name: N/A     Number of Children: N/A   ??? Years of Education: N/A     Occupational History   ??? Not on file.     Social History Main Topics   ??? Smoking status: Never Smoker    ??? Smokeless tobacco: Never Used   ??? Alcohol Use: No   ??? Drug Use: No      narcotic seeking per hospital hx.   ??? Sexually Active: Yes -- Female partner(s)     Birth Control/ Protection: Surgical     Other Topics Concern   ??? Not on file     Social History Narrative   ??? No narrative on file                  ALLERGIES: Ativan; Codeine; Influenza virus vaccine; Iv dye, iodine containing contrast media; Ketorolac tromethamine; Morphine; Motrin; Pcn; Pneumovax 23; Prednisone; and Shellfish      Review of Systems   Respiratory: Positive for shortness of breath.    Cardiovascular: Positive for chest pain.   [all other systems reviewed and are negative        Filed Vitals:    07/26/10 1820   BP: 141/66   Pulse: 79   Temp: 98.3 ??F (36.8 ??C)   Resp: 18   Height: 5\' 4"  (1.626 m)    Weight: 63.957 kg (141 lb)   SpO2: 98%            Physical Exam   [nursing notereviewed.  Constitutional: She is oriented to person, place, and time. She appears well-developed and well-nourished.   HENT:   Head: Normocephalic and atraumatic.   Mouth/Throat: Oropharynx is clear and moist.   Eyes: EOM are normal. Pupils are equal, round, and reactive to light.   Neck: Normal range of motion. Neck supple.   Cardiovascular: Normal rate, regular rhythm and intact distal pulses.    Pulmonary/Chest: Effort normal and breath sounds normal. No respiratory distress. She exhibits no tenderness.   Abdominal: Soft. Bowel sounds are normal. There  is no tenderness.   Musculoskeletal: Normal range of motion.   Neurological: She is alert and oriented to person, place, and time.   Skin: Skin is warm and dry.   Psychiatric: She has a normal mood and affect.        MDM    Procedures    Pt is a 45 y/o female with known hx of Factor V Leiden deficiency.  She has had multiple PE in the past and has been out of her coumadin for about 1 week.  She presents with 2 days of intermittent chest wall pain.  When the pain occurs, she feels short of breath, when it resolves, so does the SOB.  No fevers/chills. Pt sts her PMD would not call in RX for coumadin for her.

## 2010-07-26 NOTE — ED Notes (Signed)
Unable to get blood with butterfly stick. Contacted lab and they will attempt.

## 2010-07-26 NOTE — ED Notes (Signed)
ED workup negative.  Not hypoxic here.  Discussed need for coumadin compliance.

## 2010-07-26 NOTE — ED Notes (Signed)
Pt presents to ER for pain to chest upon inspiration x 2 days, she has Hx of freq PE x 14 events, pain is similar.

## 2010-07-26 NOTE — ED Notes (Signed)
Lab able to collect blood. Patient medicated for pain. Will continue to monitor.

## 2010-07-26 NOTE — ED Notes (Signed)
I have reviewed discharge instructions with the patient.  The patient verbalized understanding. Patient to follow up with PMD or RTED with any changes or concerns. Patient ambulatory from ED with friend to drive her home.

## 2010-07-27 LAB — TROPONIN I: Troponin-I, Qt.: <0.04 NG/ML — ABNORMAL LOW (ref 0.04–0.05)

## 2010-07-27 LAB — METABOLIC PANEL, COMPREHENSIVE
A-G Ratio: 1.1 — ABNORMAL LOW (ref 1.2–3.5)
ALT (SGPT): 37 U/L — ABNORMAL LOW (ref 39–65)
AST (SGOT): 17 U/L (ref 15–37)
Albumin: 4.1 g/dL (ref 3.5–5.0)
Alk. phosphatase: 160 U/L — ABNORMAL HIGH (ref 50–136)
Anion gap: 8 mmol/L (ref 7–16)
BUN: 14 MG/DL (ref 6–23)
Bilirubin, total: 0.4 MG/DL (ref 0.2–1.1)
CO2: 25 MMOL/L (ref 23–32)
Calcium: 9.3 MG/DL (ref 8.3–10.4)
Chloride: 105 MMOL/L (ref 98–107)
Creatinine: 0.8 MG/DL (ref 0.6–1.5)
GFR est AA: >60 mL/min/{1.73_m2} (ref >60)
GFR est non-AA: >60 mL/min/{1.73_m2} (ref >60)
Globulin: 3.7 g/dL — ABNORMAL HIGH (ref 2.3–3.5)
Glucose: 116 MG/DL — ABNORMAL HIGH (ref 65–100)
Potassium: 3.9 MMOL/L (ref 3.5–5.1)
Protein, total: 7.8 g/dL (ref 6.3–8.2)
Sodium: 138 MMOL/L (ref 136–145)

## 2010-07-27 LAB — CBC WITH AUTOMATED DIFF
ABS. BASOPHILS: 0 10*3/uL (ref 0.0–0.2)
ABS. EOSINOPHILS: 0.3 10*3/uL (ref 0.0–0.8)
ABS. IMM. GRANS.: 0 10*3/uL (ref 0.0–0.5)
ABS. LYMPHOCYTES: 1.9 10*3/uL (ref 0.5–4.6)
ABS. MONOCYTES: 0.5 10*3/uL (ref 0.1–1.3)
ABS. NEUTROPHILS: 4.6 10*3/uL (ref 1.7–8.2)
BASOPHILS: 1 % (ref 0.0–2.0)
EOSINOPHILS: 4 % (ref 0.5–7.8)
HCT: 40 % (ref 35.8–46.3)
HGB: 13.9 g/dL (ref 11.7–15.4)
IMMATURE GRANULOCYTES: 0 % (ref 0.0–5.0)
LYMPHOCYTES: 26 % (ref 13–44)
MCH: 33.3 PG — ABNORMAL HIGH (ref 26.1–32.9)
MCHC: 34.8 g/dL (ref 31.4–35.0)
MCV: 95.7 FL (ref 79.6–97.8)
MONOCYTES: 6 % (ref 4.0–12.0)
MPV: 10.1 FL — ABNORMAL LOW (ref 10.8–14.1)
NEUTROPHILS: 63 % (ref 43–78)
PLATELET: 272 10*3/uL (ref 150–450)
RBC: 4.18 M/uL (ref 4.05–5.25)
RDW: 12.9 % (ref 11.9–14.6)
WBC: 7.3 10*3/uL (ref 4.3–11.1)

## 2010-07-27 LAB — PTT: aPTT: 25.8 s (ref 23.5–31.7)

## 2010-07-27 LAB — PROTHROMBIN TIME + INR
INR: 1.1 (ref 0.9–1.2)
Prothrombin time: 11.2 s — ABNORMAL HIGH (ref 9.4–10.8)

## 2010-07-27 MED ORDER — WARFARIN 5 MG TAB
5 mg | ORAL_TABLET | Freq: Every day | ORAL | Status: DC
Start: 2010-07-27 — End: 2010-11-09

## 2010-07-27 MED ORDER — HYDROCODONE-ACETAMINOPHEN 5 MG-325 MG TAB
5-325 mg | ORAL | Status: AC
Start: 2010-07-27 — End: 2010-07-26
  Administered 2010-07-27: 01:00:00 via ORAL

## 2010-07-29 LAB — EKG, 12 LEAD, INITIAL
Atrial Rate: 73 {beats}/min
Calculated P Axis: 54 degrees
Calculated R Axis: 84 degrees
Calculated T Axis: 59 degrees
Diagnosis: NORMAL
P-R Interval: 158 ms
Q-T Interval: 362 ms
QRS Duration: 86 ms
QTC Calculation (Bezet): 398 ms
Ventricular Rate: 73 {beats}/min

## 2010-11-09 NOTE — ED Notes (Signed)
I have reviewed discharge instructions with the patient.  The patient verbalized understanding.

## 2010-11-09 NOTE — ED Notes (Addendum)
Pt states she broke her tooth and it has caused her to have a migraine.  Pt told her ride must come back and be present for pain medication to be given.  Pt stares she always gets demerol or Dilauded

## 2010-11-09 NOTE — ED Provider Notes (Signed)
Patient is a 45 y.o. female presenting with dental problem and migraine. [name]   Dental Pain   This is a new problem. The current episode started yesterday. The problem occurs rarely. The problem has not changed since onset.The pain is located in the left upper mouth.The quality of the pain is aching.  The pain is at a severity of 8/10. The pain is moderate. Associated symptoms include headaches.There was no vomiting and no swelling. She has tried nothing for the symptoms. The treatment provided no relief. The patient has no cardiac history.  Migraine          Past Medical History   Diagnosis Date   ??? 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 Infectious disease 02/2009      Hx MRSA / E.Coli bacteremia, MRSA wound infection   ??? Factor V Leiden mutation         Past Surgical History   Procedure Date   ??? Cystoscopy ???   ??? Lithotripsy 2006   ??? Hx vascular access July, 2010     Power Port placed in [place], [place]., removed   ??? Hx appendectomy ???   ??? Hx tonsillectomy    ??? Hx urological      cystoscopex13   ??? Hx cesarean section    ??? Hx tubal ligation          Family History   Problem Relation Age of Onset   ??? Cancer Father      died from colon ca.   ??? Cancer Maternal Grandmother      metastatic breast CA.        History     Social History   ??? Marital Status: Divorced     Spouse Name: N/A     Number of Children: N/A   ??? Years of Education: N/A     Occupational History   ??? Not on file.     Social History Main Topics   ??? Smoking status: Never Smoker    ??? Smokeless tobacco: Never Used   ??? Alcohol Use: No   ??? Drug Use: No      narcotic seeking per hospital hx.   ??? Sexually Active: Yes -- Female partner(s)     Birth Control/ Protection: Surgical     Other Topics Concern   ??? Not on file     Social History Narrative   ??? No narrative on file                   ALLERGIES: Ativan; Codeine; Influenza virus vaccine; Iv dye, iodine containing contrast media; Ketorolac tromethamine; Morphine; Motrin; Pcn; Pneumovax 23; Prednisone; and Shellfish      Review of Systems   HENT: Positive for dental problem.    All other systems reviewed and are negative.        Filed Vitals:    11/09/10 2028   BP: 113/68   Pulse: 79   Temp: 98.4 ??F (36.9 ??C)   Resp: 16   Height: 5\' 4"  (1.626 m)   Weight: 09.811 kg (142 lb)   SpO2: 99%            Physical Exam   Nursing note and vitals reviewed.  Constitutional: She is oriented to person, place, and time. She appears well-developed and well-nourished. No distress.   HENT:   Head: Normocephalic and atraumatic.   Mouth/Throat:  Small hole to left upper premolar, no swelling or gum redness, pt has partial  upper plate for upper front two teeth    Eyes: Conjunctivae and EOM are normal. Pupils are equal, round, and reactive to light.   Neck: Normal range of motion. Neck supple.   Cardiovascular: Normal rate and regular rhythm.    Pulmonary/Chest: Effort normal and breath sounds normal. No respiratory distress. She has no wheezes.   Abdominal: Soft. Bowel sounds are normal.   Musculoskeletal: She exhibits no edema.   Neurological: She is alert and oriented to person, place, and time.   Skin: Skin is warm.        MDM     Differential Diagnosis; Clinical Impression; Plan:     Dental pain   Amount and/or Complexity of Data Reviewed:    Review and summarize past medical records:  Yes  Risk of Significant Complications, Morbidity, and/or Mortality:   Presenting problems:  Low  Diagnostic procedures:  Low  Management options:  Low  Progress:   Patient progress:  Improved and stable      Procedures

## 2010-11-09 NOTE — ED Notes (Signed)
Pt states that she has been having increased pain since breaking one of her teeth w/ "migraine" since onset of pain, no OTC meds attempted.

## 2010-11-10 MED ORDER — HYDROCODONE-ACETAMINOPHEN 5 MG-325 MG TAB
5-325 mg | ORAL_TABLET | Freq: Three times a day (TID) | ORAL | Status: AC | PRN
Start: 2010-11-10 — End: 2010-11-12

## 2010-11-10 MED ORDER — HYDROCODONE-ACETAMINOPHEN 5 MG-325 MG TAB
5-325 mg | ORAL | Status: AC
Start: 2010-11-10 — End: 2010-11-09
  Administered 2010-11-10: 02:00:00 via ORAL

## 2010-12-13 LAB — METABOLIC PANEL, COMPREHENSIVE
A-G Ratio: 1.2 (ref 1.2–3.5)
ALT (SGPT): 25 U/L (ref 12–65)
AST (SGOT): 20 U/L (ref 15–37)
Albumin: 4.4 g/dL (ref 3.5–5.0)
Alk. phosphatase: 144 U/L — ABNORMAL HIGH (ref 50–136)
Anion gap: 7 mmol/L (ref 7–16)
BUN: 10 MG/DL (ref 6–23)
Bilirubin, total: 0.3 MG/DL (ref 0.2–1.1)
CO2: 25 MMOL/L (ref 21–32)
Calcium: 9.3 MG/DL (ref 8.3–10.4)
Chloride: 108 MMOL/L — ABNORMAL HIGH (ref 98–107)
Creatinine: 0.8 MG/DL (ref 0.6–1.0)
GFR est AA: >60 mL/min/{1.73_m2} (ref >60)
GFR est non-AA: >60 mL/min/{1.73_m2} (ref >60)
Globulin: 3.8 g/dL — ABNORMAL HIGH (ref 2.3–3.5)
Glucose: 87 MG/DL (ref 65–100)
Potassium: 4.1 MMOL/L (ref 3.5–5.1)
Protein, total: 8.2 g/dL (ref 6.3–8.2)
Sodium: 140 MMOL/L (ref 136–145)

## 2010-12-13 LAB — CBC WITH AUTOMATED DIFF
ABS. BASOPHILS: 0 10*3/uL (ref 0.0–0.2)
ABS. EOSINOPHILS: 0.2 10*3/uL (ref 0.0–0.8)
ABS. IMM. GRANS.: 0 10*3/uL (ref 0.0–0.5)
ABS. LYMPHOCYTES: 1.4 10*3/uL (ref 0.5–4.6)
ABS. MONOCYTES: 0.3 10*3/uL (ref 0.1–1.3)
ABS. NEUTROPHILS: 2.2 10*3/uL (ref 1.7–8.2)
BASOPHILS: 1 % (ref 0.0–2.0)
EOSINOPHILS: 6 % (ref 0.5–7.8)
HCT: 40.9 % (ref 35.8–46.3)
HGB: 14 g/dL (ref 11.7–15.4)
IMMATURE GRANULOCYTES: 0 % (ref 0.0–5.0)
LYMPHOCYTES: 33 % (ref 13–44)
MCH: 32.7 PG (ref 26.1–32.9)
MCHC: 34.2 g/dL (ref 31.4–35.0)
MCV: 95.6 FL (ref 79.6–97.8)
MONOCYTES: 8 % (ref 4.0–12.0)
MPV: 10.7 FL — ABNORMAL LOW (ref 10.8–14.1)
NEUTROPHILS: 52 % (ref 43–78)
PLATELET: 201 10*3/uL (ref 150–450)
RBC: 4.28 M/uL (ref 4.05–5.25)
RDW: 12.6 % (ref 11.9–14.6)
WBC: 4.2 10*3/uL — ABNORMAL LOW (ref 4.3–11.1)

## 2010-12-13 LAB — EKG, 12 LEAD, INITIAL
Atrial Rate: 81 {beats}/min
Calculated P Axis: 73 degrees
Calculated R Axis: 89 degrees
Calculated T Axis: 77 degrees
P-R Interval: 170 ms
Q-T Interval: 348 ms
QRS Duration: 86 ms
QTC Calculation (Bezet): 404 ms
Ventricular Rate: 81 {beats}/min

## 2010-12-13 LAB — TROPONIN I: Troponin-I, Qt.: <0.02 NG/ML — ABNORMAL LOW (ref 0.02–0.05)

## 2010-12-13 LAB — URINE MICROSCOPIC
Casts: 0 /LPF
Crystals, urine: 0 /LPF
Mucus: 0 /LPF

## 2010-12-13 LAB — POC TROPONIN: Troponin-I (POC): 0 ng/ml (ref 0.0–0.08)

## 2010-12-13 LAB — PROTHROMBIN TIME + INR
INR: 3.1 — ABNORMAL HIGH (ref 0.9–1.2)
Prothrombin time: 32.7 s — ABNORMAL HIGH (ref 9.4–10.8)

## 2010-12-13 LAB — BNP: BNP: 27 pg/mL

## 2010-12-13 LAB — HCG URINE, QL. - POC: Pregnancy test,urine (POC): NEGATIVE

## 2010-12-13 MED ORDER — ONDANSETRON 8 MG TAB, RAPID DISSOLVE
8 mg | ORAL | Status: AC
Start: 2010-12-13 — End: 2010-12-13
  Administered 2010-12-13: 16:00:00 via ORAL

## 2010-12-13 MED ORDER — NUCLEAR MEDICINE ISOTOPE
Freq: Once | Status: AC
Start: 2010-12-13 — End: 2010-12-13
  Administered 2010-12-13: 18:00:00

## 2010-12-13 MED ORDER — HYDROMORPHONE (PF) 1 MG/ML IJ SOLN
1 mg/mL | INTRAMUSCULAR | Status: AC
Start: 2010-12-13 — End: 2010-12-13
  Administered 2010-12-13: 17:00:00 via INTRAVENOUS

## 2010-12-13 MED ORDER — HYDROCODONE-ACETAMINOPHEN 5 MG-325 MG TAB
5-325 mg | ORAL_TABLET | ORAL | Status: DC | PRN
Start: 2010-12-13 — End: 2011-01-09

## 2010-12-13 MED ORDER — DIPHENHYDRAMINE HCL 50 MG/ML IJ SOLN
50 mg/mL | INTRAMUSCULAR | Status: AC
Start: 2010-12-13 — End: 2010-12-13
  Administered 2010-12-13: 17:00:00 via INTRAVENOUS

## 2010-12-13 MED ORDER — HYDROCODONE-ACETAMINOPHEN 5 MG-325 MG TAB
5-325 mg | ORAL | Status: AC
Start: 2010-12-13 — End: 2010-12-13
  Administered 2010-12-13: 20:00:00 via ORAL

## 2010-12-13 MED ORDER — HYDROMORPHONE (PF) 1 MG/ML IJ SOLN
1 mg/mL | INTRAMUSCULAR | Status: AC
Start: 2010-12-13 — End: 2010-12-13
  Administered 2010-12-13: 16:00:00 via INTRAVENOUS

## 2010-12-13 NOTE — ED Notes (Signed)
MD updated pt on status and results.

## 2010-12-13 NOTE — ED Notes (Signed)
Pt to Nuc Med for scan / via stretcher / on O2 via NC.

## 2010-12-13 NOTE — ED Notes (Signed)
Patient states history of blood clots in lungs, having pain in chest and shortness of breath at times

## 2010-12-13 NOTE — ED Notes (Signed)
Pt ambulatory to bathroom.

## 2010-12-13 NOTE — ED Notes (Signed)
Case d/w Dr. Berline Chough at [phone] who feels there is nothing further to do for pt except pain meds. Will double check a Trop and then plan to discharge.

## 2010-12-13 NOTE — ED Notes (Signed)
2nd troponin drawn and processing in ER lab.

## 2010-12-13 NOTE — ED Notes (Signed)
Urinalysis and UHCG completed and sent to lab for micro.

## 2010-12-13 NOTE — ED Notes (Signed)
Pt c/o "itching" after 2nd dose Dilaudid given / no rash noted / Dr Lawerance Cruel made aware / Benadryl given per order.  No swelling, no rash, no hives.

## 2010-12-13 NOTE — ED Notes (Signed)
Pt denies Chest pain / pt c/o pain to left side upper back at shoulder blade area.  Pt aware need of urine specimen / call light at bedside.

## 2010-12-13 NOTE — ED Notes (Signed)
12 lead EKG NSR at 8, PR 170, QRS 86, QTc 404, no ST elev or dep, no arrhythmia or ischemia. Dilaudid helped for a while but she feels like it is wearing off. Nuc med is almost ready for her.

## 2010-12-13 NOTE — ED Notes (Signed)
EKG already completed / pt on monitor, BP, pulse ox.  Labs drawn and sent / IV access placed.  Meds given per order.

## 2010-12-13 NOTE — ED Notes (Signed)
Pt back to room / scan and xray completed.

## 2010-12-13 NOTE — ED Notes (Signed)
Second Trop was zero. Will discharge home. Pt is feeling better but still has pain. Will give a dose of norco prior to departure. Advised her to contact her PCP and get a baseline stress test when feeling better but this does not present like cardiac type CP and mo evidence of acute PE and pt is txic on her INR and has an IVC.

## 2010-12-13 NOTE — ED Provider Notes (Signed)
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 Started having sxs this am, at 0530 began having some SOB and at 0730 started having CP on L upper chest that "goes through to her lung" and is a constant stabbing 7-8/10 pain. +N with pain. No V,F,C. Dry cough for past 45 minutes. Has an IVC filter. Denies any recent travel or trauma.    Patient is a 45 y.o. female presenting with shortness of breath. [name]   Shortness of Breath  This is a recurrent problem. Associated symptoms include chest pain. Pertinent negatives include no fever, no cough and no vomiting.        Past Medical History   Diagnosis Date   ??? 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 Infectious disease 02/2009      Hx MRSA / E.Coli bacteremia, MRSA wound infection   ??? Factor V Leiden mutation    ??? Chronic kidney disease      kidney stones   ??? Neurological disorder      migraines        Past Surgical History   Procedure Date   ??? Cystoscopy ???   ??? Lithotripsy 2006   ??? Hx vascular access July, 2010     Power Port placed in [place], [place] removed   ??? Hx appendectomy ???   ??? Hx tonsillectomy    ??? Hx urological      cystoscopex13   ??? Hx cesarean section    ??? Hx tubal ligation          Family History   Problem Relation Age of Onset   ??? Cancer Father      died from colon ca.   ??? Cancer Maternal Grandmother      metastatic breast CA.        History     Social History   ??? Marital Status: Divorced     Spouse Name: N/A     Number of Children: N/A   ??? Years of Education: N/A     Occupational History   ??? Not on file.     Social History Main Topics   ??? Smoking status: Never Smoker    ??? Smokeless tobacco: Never Used   ??? Alcohol Use: No   ??? Drug Use: No      narcotic seeking per hospital hx.   ??? Sexually Active: Yes -- Female partner(s)     Birth Control/ Protection: Surgical     Other Topics Concern   ??? Not on file     Social History Narrative    ??? No narrative on file                  ALLERGIES: Ativan; Codeine; Influenza virus vaccines; Iodinated contrast media - iv dye; Ketorolac tromethamine; Morphine; Motrin; Pcn; Pneumovax 23; Prednisone; and Shellfish containing products      Review of Systems   Constitutional: Negative for fever and chills.   HENT: Negative for congestion.    Respiratory: Positive for shortness of breath. Negative for cough and chest tightness.    Cardiovascular: Positive for chest pain.   Gastrointestinal: Positive for nausea. Negative for vomiting.   All other systems reviewed and are negative.        Filed Vitals:    12/13/10 1101   BP: 133/66   Pulse: 84   Temp: 98.2 ??  F (36.8 ??C)   Resp: 20   Height: 5\' 4"  (1.626 m)   Weight: 64.411 kg (142 lb)   SpO2: 96%            Physical Exam   Nursing note and vitals reviewed.  Constitutional: She is oriented to person, place, and time. She appears well-developed and well-nourished. She appears distressed (mod due to pain and anxious).   HENT:   Head: Normocephalic and atraumatic.   Nose: Nose normal.   Mouth/Throat: Oropharynx is clear and moist.   Eyes: EOM are normal. Pupils are equal, round, and reactive to light.   Neck: Normal range of motion. Neck supple.   Cardiovascular: Normal rate, regular rhythm, normal heart sounds and intact distal pulses.    Pulmonary/Chest: Effort normal and breath sounds normal. No respiratory distress. She has no wheezes. She has no rales. She exhibits no tenderness.   Abdominal: Soft. She exhibits no distension. There is no tenderness. There is no rebound and no guarding.        thin   Musculoskeletal: Normal range of motion. She exhibits no edema and no tenderness.   Neurological: She is alert and oriented to person, place, and time.        Grossly nonfocal   Skin: Skin is warm and dry. She is not diaphoretic.        No acute lesions noted   Psychiatric:        Mod anxious        MDM    Procedures

## 2010-12-13 NOTE — ED Notes (Signed)
Med given per order / pt remains on monitor, BP, pulse ox and O2 via NC.  Pt waiting to go to radiology for NM scan.

## 2010-12-13 NOTE — ED Notes (Signed)
meds given per order / pt d/c'd, ambulatory.  Prescrip and instructions given / pt verbalized understanding and e-signed out.

## 2011-01-09 MED ORDER — PROMETHAZINE 25 MG/ML INJECTION
25 mg/mL | INTRAMUSCULAR | Status: DC
Start: 2011-01-09 — End: 2011-01-09

## 2011-01-09 MED ORDER — BUTALBITAL-ACETAMINOPHEN-CAFFEINE 50 MG-325 MG-40 MG TAB
50-325-40 mg | ORAL_TABLET | Freq: Four times a day (QID) | ORAL | Status: DC | PRN
Start: 2011-01-09 — End: 2011-03-08

## 2011-01-09 MED ORDER — DIPHENHYDRAMINE 25 MG CAP
25 mg | ORAL | Status: DC
Start: 2011-01-09 — End: 2011-01-09

## 2011-01-09 MED ORDER — KETOROLAC TROMETHAMINE 30 MG/ML INJECTION
30 mg/mL (1 mL) | INTRAMUSCULAR | Status: DC
Start: 2011-01-09 — End: 2011-01-09

## 2011-01-09 MED ORDER — DEXAMETHASONE SODIUM PHOSPHATE 10 MG/ML IJ SOLN
10 mg/mL | INTRAMUSCULAR | Status: DC
Start: 2011-01-09 — End: 2011-01-09

## 2011-01-09 MED ORDER — ONDANSETRON 8 MG TAB, RAPID DISSOLVE
8 mg | ORAL | Status: AC
Start: 2011-01-09 — End: 2011-01-09
  Administered 2011-01-09: 22:00:00 via ORAL

## 2011-01-09 MED ORDER — PROMETHAZINE 25 MG TAB
25 mg | ORAL_TABLET | Freq: Four times a day (QID) | ORAL | Status: DC | PRN
Start: 2011-01-09 — End: 2011-04-06

## 2011-01-09 MED ORDER — PROMETHAZINE 25 MG TAB
25 mg | ORAL | Status: AC
Start: 2011-01-09 — End: 2011-01-09
  Administered 2011-01-09: 22:00:00 via ORAL

## 2011-01-09 MED ORDER — SODIUM CHLORIDE 0.9% BOLUS IV
0.9 % | Freq: Once | INTRAVENOUS | Status: DC
Start: 2011-01-09 — End: 2011-01-09

## 2011-01-09 MED ORDER — PROMETHAZINE 25 MG TAB
25 mg | ORAL | Status: DC
Start: 2011-01-09 — End: 2011-01-09

## 2011-01-09 MED ORDER — DIPHENHYDRAMINE HCL 50 MG/ML IJ SOLN
50 mg/mL | INTRAMUSCULAR | Status: DC
Start: 2011-01-09 — End: 2011-01-09

## 2011-01-09 MED ORDER — ONDANSETRON (PF) 4 MG/2 ML INJECTION
4 mg/2 mL | INTRAMUSCULAR | Status: DC
Start: 2011-01-09 — End: 2011-01-09

## 2011-01-09 MED ORDER — DIPHENHYDRAMINE 50 MG CAP
50 mg | ORAL | Status: AC
Start: 2011-01-09 — End: 2011-01-09
  Administered 2011-01-09: 22:00:00 via ORAL

## 2011-01-09 MED ORDER — DEXAMETHASONE SODIUM PHOSPHATE 10 MG/ML IJ SOLN
10 mg/mL | INTRAMUSCULAR | Status: AC
Start: 2011-01-09 — End: 2011-01-09
  Administered 2011-01-09: 22:00:00 via INTRAMUSCULAR

## 2011-01-09 MED ADMIN — ketorolac tromethamine (TORADOL) 60 mg/2 mL injection 60 mg: INTRAMUSCULAR | @ 22:00:00 | NDC 00409379601

## 2011-01-09 NOTE — ED Notes (Signed)
Pt c/o HA since last night, pt reports using Imitrex at home with out improvement. Pt reports one episode of vomiting PTA.

## 2011-01-09 NOTE — ED Notes (Signed)
Asked CC S. Su Grand in assistance in establishing IV after failed attempt to visualize/palpate suitable injection site for IV

## 2011-01-09 NOTE — ED Notes (Signed)
PA informed we were unable to establish IV

## 2011-01-09 NOTE — ED Notes (Signed)
CC [employer] attempting IV stick

## 2011-01-09 NOTE — ED Provider Notes (Addendum)
HPI Comments: Pt has a history of migraines and this feels like her normal migraine. She has had an episode of nausea and vomiting with it. She used to see a neurologist for them and now she has medicaid and medicare and they won't see her.     Patient is a 45 y.o. female presenting with headaches. [name]   Headache   This is a recurrent problem. The current episode started 2 days ago. The problem occurs constantly. The problem has not changed since onset.The headache is aggravated by loud noise and bright light. The pain is located in the left unilateral region. The quality of the pain is described as dull. The pain is at a severity of 8/10. The pain is severe. Associated symptoms include nausea and vomiting. Pertinent negatives include no anorexia, no fever, no malaise/fatigue, no chest pressure, no near-syncope, no orthopnea, no palpitations, no syncope, no shortness of breath, no weakness, no tingling, no dizziness and no visual change. Treatments tried: imitrex. The treatment provided no relief.        Past Medical History   Diagnosis Date   ??? 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 Infectious disease 02/2009      Hx MRSA / E.Coli bacteremia, MRSA wound infection   ??? Factor V Leiden mutation    ??? Chronic kidney disease      kidney stones   ??? Neurological disorder      migraines   ??? Thromboembolus         Past Surgical History   Procedure Date   ??? Cystoscopy ???   ??? Lithotripsy 2006   ??? Hx vascular access July, 2010     Power Port placed in [place], [place] removed   ??? Hx appendectomy ???   ??? Hx tonsillectomy    ??? Hx urological      cystoscopex13   ??? Hx cesarean section    ??? Hx tubal ligation          Family History   Problem Relation Age of Onset   ??? Cancer Father      died from colon ca.   ??? Cancer Maternal Grandmother      metastatic breast CA.        History     Social History   ??? Marital Status: Divorced     Spouse Name: N/A     Number of Children: N/A    ??? Years of Education: N/A     Occupational History   ??? Not on file.     Social History Main Topics   ??? Smoking status: Never Smoker    ??? Smokeless tobacco: Never Used   ??? Alcohol Use: No   ??? Drug Use: No      narcotic seeking per hospital hx.   ??? Sexually Active: Yes -- Female partner(s)     Birth Control/ Protection: Surgical     Other Topics Concern   ??? Not on file     Social History Narrative   ??? No narrative on file                  ALLERGIES: Ativan; Codeine; Influenza virus vaccines; Iodinated contrast media - iv dye; Ketorolac tromethamine; Morphine; Motrin; Pcn; Pneumovax 23; Prednisone; and Shellfish containing products      Review of Systems   Constitutional: Negative.  Negative for fever and malaise/fatigue.   Eyes: Negative.  Respiratory: Negative.  Negative for shortness of breath.    Cardiovascular: Negative.  Negative for palpitations, orthopnea, syncope and near-syncope.   Gastrointestinal: Positive for nausea and vomiting. Negative for abdominal pain, diarrhea, constipation, blood in stool, abdominal distention, anal bleeding, rectal pain and anorexia.   Genitourinary: Negative.    Musculoskeletal: Negative.    Skin: Negative.    Neurological: Positive for headaches. Negative for dizziness, tingling and weakness.   Hematological: Negative.    Psychiatric/Behavioral: Negative.    All other systems reviewed and are negative.        Filed Vitals:    01/09/11 1502   BP: 126/72   Pulse: 67   Temp: 98.6 ??F (37 ??C)   Resp: 14   Height: 5\' 4"  (1.626 m)   Weight: 64.411 kg (142 lb)   SpO2: 97%            Physical Exam   Nursing note and vitals reviewed.  Constitutional: She is oriented to person, place, and time. She appears well-developed and well-nourished.   HENT:   Head: Normocephalic and atraumatic.   Right Ear: External ear normal.   Left Ear: External ear normal.   Nose: Nose normal.   Mouth/Throat: Oropharynx is clear and moist.    Eyes: Conjunctivae and EOM are normal. Pupils are equal, round, and reactive to light.   Neck: Normal range of motion. Neck supple.   Cardiovascular: Normal rate, regular rhythm, normal heart sounds and intact distal pulses.    Pulmonary/Chest: Effort normal and breath sounds normal.   Abdominal: Soft. Bowel sounds are normal.   Musculoskeletal: Normal range of motion.   Neurological: She is alert and oriented to person, place, and time. She has normal strength and normal reflexes. No cranial nerve deficit or sensory deficit. She displays a negative Romberg sign. GCS eye subscore is 4. GCS verbal subscore is 5. GCS motor subscore is 6.   Skin: Skin is warm and dry.   Psychiatric: She has a normal mood and affect. Her behavior is normal. Judgment and thought content normal.        MDM    Procedures    The patient was observed in the ED.    I discussed the results of all labs, procedures, radiographs, and treatments with the patient and available family.  Treatment plan is agreed upon and the patient is ready for discharge.  All voiced understanding of the discharge plan and medication instructions or changes as appropriate.  Questions about treatment in the ED were answered.  All were encouraged to return should symptoms worsen or new problems develop.

## 2011-01-09 NOTE — ED Notes (Signed)
Copy of discharge instructions given to patient. Denies any questions at this time.

## 2011-01-18 NOTE — ED Notes (Signed)
Patient states she has had tubal ligation in which her tubes were cut, tied, and burned.  Transported to CT.

## 2011-01-18 NOTE — ED Notes (Signed)
Patient on phone upon entry into room to give medication.  Asked to hang up for medication administration.  After medicine given, transported to CT via stretcher.

## 2011-01-18 NOTE — ED Provider Notes (Signed)
HPI Comments: Onset of his typical pain for kidney stones about 1600 yesterday assoc with nausea    Patient is a 45 y.o. female presenting with flank pain. The history is provided by the patient and medical records.   Flank Pain   This is a recurrent problem. The current episode started yesterday. The problem has been gradually worsening (worse with nausea over the last couple of hours). The problem occurs constantly. Patient reports no work related injury.The pain is associated with no known injury. The pain is present in the left side. The quality of the pain is described as stabbing and sharp. Radiates to: around to front on left  The pain is at a severity of 9/10. The pain is severe. Pertinent negatives include no chest pain, no fever, no bladder incontinence, no dysuria and no pelvic pain. She has tried nothing for the symptoms. Risk factors include history of kidney stones. had to have kidney stones removed       Past Medical History   Diagnosis Date   ??? 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 Infectious disease 02/2009      Hx MRSA / E.Coli bacteremia, MRSA wound infection   ??? Factor V Leiden mutation    ??? Chronic kidney disease      kidney stones   ??? Neurological disorder      migraines   ??? Thromboembolus         Past Surgical History   Procedure Date   ??? Cystoscopy ???   ??? Lithotripsy 2006   ??? Hx vascular access July, 2010     Power Port placed in [place], [place] removed   ??? Hx appendectomy ???   ??? Hx tonsillectomy    ??? Hx urological      cystoscopex13   ??? Hx cesarean section    ??? Hx tubal ligation          Family History   Problem Relation Age of Onset   ??? Cancer Father      died from colon ca.   ??? Cancer Maternal Grandmother      metastatic breast CA.        History     Social History   ??? Marital Status: Divorced     Spouse Name: N/A     Number of Children: N/A   ??? Years of Education: N/A     Occupational History   ??? Not on file.     Social History Main Topics    ??? Smoking status: Never Smoker    ??? Smokeless tobacco: Never Used   ??? Alcohol Use: No   ??? Drug Use: No      narcotic seeking per hospital hx.   ??? Sexually Active: Yes -- Female partner(s)     Birth Control/ Protection: Surgical     Other Topics Concern   ??? Not on file     Social History Narrative   ??? No narrative on file                  ALLERGIES: Ativan; Codeine; Influenza virus vaccines; Iodinated contrast media - iv dye; Ketorolac tromethamine; Morphine; Motrin; Pcn; Pneumococcal vaccine; Pneumovax 23; Prednisone; and Shellfish containing products      Review of Systems   Constitutional: Positive for activity change and appetite change. Negative for fever.   Cardiovascular: Negative for chest pain.   Gastrointestinal: Positive for nausea.   Genitourinary: Positive for flank pain. Negative  for bladder incontinence, dysuria and pelvic pain.   All other systems reviewed and are negative.        Filed Vitals:    01/18/11 2109   BP: 126/102   Pulse: 113   Temp: 98.1 ??F (36.7 ??C)   Resp: 14   Height: 5\' 4"  (1.626 m)   Weight: 64.411 kg (142 lb)   SpO2: 96%            Physical Exam   Nursing note and vitals reviewed.  Constitutional: She is oriented to person, place, and time. She appears well-developed and well-nourished. No distress.   HENT:   Head: Normocephalic and atraumatic.   Right Ear: External ear normal.   Left Ear: External ear normal.   Eyes: EOM are normal. Pupils are equal, round, and reactive to light.   Neck: Normal range of motion. Neck supple.   Cardiovascular: Normal rate and regular rhythm.    Pulmonary/Chest: Effort normal and breath sounds normal. She has no wheezes. She has no rales.   Abdominal: Soft. Bowel sounds are normal. There is no tenderness.   Musculoskeletal: Normal range of motion. She exhibits no edema and no tenderness.   Lymphadenopathy:     She has no cervical adenopathy.   Neurological: She is alert and oriented to person, place, and time.   Skin: Skin is warm and dry.    Psychiatric: She has a normal mood and affect.        MDM    Procedures

## 2011-01-18 NOTE — ED Notes (Signed)
Pt st flank pain that radiates down into groin. Pt st pain started approx 4 am this morning. Pt st unsure if there is blood in urine. Pt st unable to deal with pain at this time.

## 2011-01-19 LAB — URINE MICROSCOPIC
Bacteria: 0 /HPF
Casts: 0 /LPF
Mucus: 0 /LPF
WBC: 0 /HPF

## 2011-01-19 LAB — HCG URINE, QL. - POC: Pregnancy test,urine (POC): NEGATIVE

## 2011-01-19 LAB — HCG URINE, QL: HCG urine, QL: NEGATIVE

## 2011-01-19 MED ORDER — OXYCODONE-ACETAMINOPHEN 7.5 MG-325 MG TAB
ORAL_TABLET | ORAL | Status: DC | PRN
Start: 2011-01-19 — End: 2011-03-08

## 2011-01-19 MED ORDER — HYDROMORPHONE (PF) 1 MG/ML IJ SOLN
1 mg/mL | INTRAMUSCULAR | Status: AC
Start: 2011-01-19 — End: 2011-01-18
  Administered 2011-01-19: 04:00:00 via INTRAMUSCULAR

## 2011-01-19 MED ORDER — PROMETHAZINE 25 MG/ML INJECTION
25 mg/mL | INTRAMUSCULAR | Status: AC
Start: 2011-01-19 — End: 2011-01-18
  Administered 2011-01-19: 04:00:00 via INTRAMUSCULAR

## 2011-01-19 NOTE — ED Notes (Signed)
Talked with pt about results of her ct and urine. Her pain has improved. She said she has some phenergan at home. Emphasized need for her to call her urologist.

## 2011-01-19 NOTE — ED Notes (Signed)
I have reviewed discharge instructions with the patient and roommate.  The patient and roommate verbalized understanding.  Pt education for medication including not to drink alcohol, drive, work, or make important decisions given.  Pt verbalized understanding of teaching.   No questions when given opportunity to ask.  Prescription for Percocet 7.5 given.  Ambulatory out of ED with roommate who is driving home.

## 2011-03-08 LAB — D-DIMER, QUANTITATIVE: D-Dimer, Quant: <0.19 ug/ml(FEU) (ref <0.55)

## 2011-03-08 LAB — CBC WITH AUTOMATED DIFF
ABS. BASOPHILS: 0 10*3/uL (ref 0.0–0.2)
ABS. EOSINOPHILS: 0.3 10*3/uL (ref 0.0–0.8)
ABS. IMM. GRANS.: 0 10*3/uL (ref 0.0–0.5)
ABS. LYMPHOCYTES: 1.7 10*3/uL (ref 0.5–4.6)
ABS. MONOCYTES: 0.5 10*3/uL (ref 0.1–1.3)
ABS. NEUTROPHILS: 4.3 10*3/uL (ref 1.7–8.2)
BASOPHILS: 0 % (ref 0.0–2.0)
EOSINOPHILS: 4 % (ref 0.5–7.8)
HCT: 41.8 % (ref 35.8–46.3)
HGB: 14.3 g/dL (ref 11.7–15.4)
IMMATURE GRANULOCYTES: 0.3 % (ref 0.0–5.0)
LYMPHOCYTES: 25 % (ref 13–44)
MCH: 32.6 PG (ref 26.1–32.9)
MCHC: 34.2 g/dL (ref 31.4–35.0)
MCV: 95.2 FL (ref 79.6–97.8)
MONOCYTES: 7 % (ref 4.0–12.0)
MPV: 10.1 FL — ABNORMAL LOW (ref 10.8–14.1)
NEUTROPHILS: 64 % (ref 43–78)
PLATELET: 265 10*3/uL (ref 150–450)
RBC: 4.39 M/uL (ref 4.05–5.25)
RDW: 12.4 % (ref 11.9–14.6)
WBC: 6.9 10*3/uL (ref 4.3–11.1)

## 2011-03-08 LAB — METABOLIC PANEL, COMPREHENSIVE
A-G Ratio: 1.2 (ref 1.2–3.5)
ALT (SGPT): 18 U/L (ref 12–65)
AST (SGOT): 14 U/L — ABNORMAL LOW (ref 15–37)
Albumin: 4.4 g/dL (ref 3.5–5.0)
Alk. phosphatase: 103 U/L (ref 50–136)
Anion gap: 10 mmol/L (ref 7–16)
BUN: 11 MG/DL (ref 6–23)
Bilirubin, total: 0.3 MG/DL (ref 0.2–1.1)
CO2: 26 MMOL/L (ref 21–32)
Calcium: 9.5 MG/DL (ref 8.3–10.4)
Chloride: 105 MMOL/L (ref 98–107)
Creatinine: 0.87 MG/DL (ref 0.6–1.0)
GFR est AA: >60 mL/min/{1.73_m2} (ref >60)
GFR est non-AA: >60 mL/min/{1.73_m2} (ref >60)
Globulin: 3.7 g/dL — ABNORMAL HIGH (ref 2.3–3.5)
Glucose: 91 MG/DL (ref 65–100)
Potassium: 3.9 MMOL/L (ref 3.5–5.1)
Protein, total: 8.1 g/dL (ref 6.3–8.2)
Sodium: 141 MMOL/L (ref 136–145)

## 2011-03-08 LAB — PROTHROMBIN TIME + INR
INR: 1.3 — ABNORMAL HIGH (ref 0.9–1.2)
Prothrombin time: 13.6 s — ABNORMAL HIGH (ref 9.4–10.8)

## 2011-03-08 LAB — D DIMER: D DIMER: <0.19 ug/ml(FEU) (ref <0.55)

## 2011-03-08 MED ORDER — TRAMADOL 50 MG TAB
50 mg | ORAL_TABLET | Freq: Four times a day (QID) | ORAL | Status: DC | PRN
Start: 2011-03-08 — End: 2011-04-06

## 2011-03-08 NOTE — ED Notes (Signed)
Talked with pt about results of tests. D dimer is neg, but her inr is 1.3. She is to double her coumadin dose for two nights. She is scheduled to have her inr repeated on 1-31/ further doses per [facility]

## 2011-03-08 NOTE — ED Notes (Signed)
Lab called to collect blood due to difficulty sticks.

## 2011-03-08 NOTE — ED Provider Notes (Signed)
HPI Comments: Pt stated she had some sob yesterday then this am about 0500 had onset of sharp right ant chest pleuritic pain. This has been constant and radiated through to her back. Some cough non productive. Denies any swelling or pain in her legs. Last inr was about 3 weeks ago.     Patient is a 46 y.o. female presenting with chest pain. The history is provided by the patient and medical records.   Chest Pain (Angina)   This is a new problem. Episode onset: onset about 0500 this am. The problem has not changed since onset.The problem occurs constantly. The pain is associated with normal activity. The pain is present in the right side. The pain is at a severity of 8/10. The quality of the pain is described as pleuritic. Radiates to: through to the back. The symptoms are aggravated by deep breathing. Associated symptoms include malaise/fatigue and shortness of breath. Pertinent negatives include no leg pain, no lower extremity edema, no nausea and no vomiting. She has tried nothing for the symptoms. Risk factors: hx of pe. Her past medical history is significant for PE.       Past Medical History   Diagnosis Date   ??? 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 Infectious disease 02/2009      Hx MRSA / E.Coli bacteremia, MRSA wound infection   ??? Factor V Leiden mutation    ??? Chronic kidney disease      kidney stones   ??? Neurological disorder      migraines   ??? Thromboembolus         Past Surgical History   Procedure Date   ??? Cystoscopy ???   ??? Lithotripsy 2006   ??? Hx vascular access July, 2010     Power Port placed in [place], [place]., removed   ??? Hx appendectomy ???   ??? Hx tonsillectomy    ??? Hx urological      cystoscopex13   ??? Hx cesarean section    ??? Hx tubal ligation          Family History   Problem Relation Age of Onset   ??? Cancer Father      died from colon ca.   ??? Cancer Maternal Grandmother      metastatic breast CA.        History     Social History   ??? Marital Status: DIVORCED     Spouse Name:  N/A     Number of Children: N/A   ??? Years of Education: N/A     Occupational History   ??? Not on file.     Social History Main Topics   ??? Smoking status: Never Smoker    ??? Smokeless tobacco: Never Used   ??? Alcohol Use: No   ??? Drug Use: No      narcotic seeking per hospital hx.   ??? Sexually Active: Yes -- Female partner(s)     Birth Control/ Protection: Surgical     Other Topics Concern   ??? Not on file     Social History Narrative   ??? No narrative on file                  ALLERGIES: Ativan; Codeine; Influenza virus vaccines; Iodinated contrast media - iv dye; Ketorolac tromethamine; Morphine; Motrin; Pcn; Pneumococcal vaccine; Pneumovax 23; Prednisone; and Shellfish containing products      Review of Systems   Constitutional: Positive  for malaise/fatigue and activity change. Negative for appetite change.   HENT: Negative.    Respiratory: Positive for shortness of breath.    Cardiovascular: Positive for chest pain. Negative for leg swelling.   Gastrointestinal: Negative for nausea and vomiting.   All other systems reviewed and are negative.        Filed Vitals:    03/08/11 1120   BP: 131/84   Pulse: 84   Temp: 98.4 ??F (36.9 ??C)   Resp: 18   Height: 5\' 4"  (1.626 m)   Weight: 66.225 kg (146 lb)   SpO2: 98%            Physical Exam   Nursing note and vitals reviewed.  Constitutional: She is oriented to person, place, and time. She appears well-developed and well-nourished. No distress.   HENT:   Head: Normocephalic and atraumatic.   Right Ear: External ear normal.   Left Ear: External ear normal.   Mouth/Throat: Oropharynx is clear and moist.   Eyes: EOM are normal. Pupils are equal, round, and reactive to light.   Neck: Normal range of motion. Neck supple.   Cardiovascular: Normal rate and regular rhythm.    Pulmonary/Chest: Effort normal and breath sounds normal. She has no wheezes. She has no rales. She exhibits no tenderness.   Abdominal: Soft. Bowel sounds are normal.   Musculoskeletal: Normal range of motion. She  exhibits no edema and no tenderness.   Lymphadenopathy:     She has no cervical adenopathy.   Neurological: She is alert and oriented to person, place, and time.   Skin: Skin is warm and dry.   Psychiatric: She has a normal mood and affect.        MDM    Procedures

## 2011-03-08 NOTE — ED Notes (Signed)
I have reviewed discharge instructions with the patient.  The patient verbalized understanding.      Pt given RX for following medications at discharge @MEDDISCHARGE@.  Pt discharged ambulatory from emergency department in no acute distress.

## 2011-03-09 LAB — EKG, 12 LEAD, INITIAL
Atrial Rate: 80 {beats}/min
Calculated P Axis: 74 degrees
Calculated R Axis: 85 degrees
Calculated T Axis: 72 degrees
P-R Interval: 158 ms
Q-T Interval: 346 ms
QRS Duration: 84 ms
QTC Calculation (Bezet): 399 ms
Ventricular Rate: 80 {beats}/min

## 2011-04-06 LAB — URINE MICROSCOPIC
Casts: 0 /LPF
Crystals, urine: 0 /LPF
Mucus: 0 /LPF

## 2011-04-06 NOTE — ED Notes (Signed)
Pt admits that she was seen at [facility] last week for same c/o and Dx w/ 4 mm stone noted per pt, consent for records obtained, pt begging for pain medication currently.

## 2011-04-06 NOTE — ED Provider Notes (Signed)
HPI Comments: Here with left flank pain , at [facility] about 4 days ago for same. No fever or chills. Has urologist but "not seen for 2 years". No fall or injury.was seen on 2/14  And was given Rx for zofran and Roxicodone 15mg  (#12) , wsas to have followed up with Dr Almyra Free Ct with bilateral non-obstructing intrarenal stones and 4mm distal right stone with no significant obstruction    Patient is a 46 y.o. female presenting with flank pain. [name]   Flank Pain   This is a recurrent problem. The current episode started more than 1 week ago. The problem has not changed since onset.The problem occurs daily. Patient reports no work related injury.The pain is associated with no known injury. The pain is present in the left side. The pain radiates to the left groin. The pain is moderate. Pertinent negatives include no fever, no abdominal pain, no abdominal swelling, no bowel incontinence, no perianal numbness, no bladder incontinence, no paresthesias, no paresis, no tingling and no weakness. She has tried nothing for the symptoms.        Past Medical History   Diagnosis Date   ??? 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 Infectious disease 02/2009      Hx MRSA / E.Coli bacteremia, MRSA wound infection   ??? Factor V Leiden mutation    ??? Chronic kidney disease      kidney stones   ??? Neurological disorder      migraines   ??? Thromboembolus         Past Surgical History   Procedure Date   ??? Cystoscopy ???   ??? Lithotripsy 2006   ??? Hx vascular access July, 2010     Power Port placed in [place], [place]., removed   ??? Hx appendectomy ???   ??? Hx tonsillectomy    ??? Hx urological      cystoscopex13   ??? Hx cesarean section    ??? Hx tubal ligation          Family History   Problem Relation Age of Onset   ??? Cancer Father      died from colon ca.   ??? Cancer Maternal Grandmother      metastatic breast CA.        History     Social History   ??? Marital Status: DIVORCED     Spouse Name: N/A     Number  of Children: N/A   ??? Years of Education: N/A     Occupational History   ??? Not on file.     Social History Main Topics   ??? Smoking status: Never Smoker    ??? Smokeless tobacco: Never Used   ??? Alcohol Use: No   ??? Drug Use: No      narcotic seeking per hospital hx.   ??? Sexually Active: Yes -- Female partner(s)     Birth Control/ Protection: Surgical     Other Topics Concern   ??? Not on file     Social History Narrative   ??? No narrative on file                  ALLERGIES: Ativan; Codeine; Influenza virus vaccines; Iodinated contrast media - iv dye; Ketorolac tromethamine; Morphine; Motrin; Pcn; Pneumococcal vaccine; Pneumovax 23; Prednisone; and Shellfish containing products      Review of Systems   Constitutional: Negative for fever and chills.  Respiratory: Negative.    Gastrointestinal: Negative for abdominal pain and bowel incontinence.   Genitourinary: Positive for flank pain. Negative for bladder incontinence.   Musculoskeletal: Negative.    Neurological: Negative for tingling, weakness and paresthesias.   Psychiatric/Behavioral: Negative.    All other systems reviewed and are negative.        Filed Vitals:    04/06/11 1700   BP: 145/97   Pulse: 89   Temp: 98.4 ??F (36.9 ??C)   Resp: 18   Height: 5\' 4"  (1.626 m)   Weight: 66.225 kg (146 lb)   SpO2: 97%            Physical Exam   Nursing note and vitals reviewed.  Constitutional: She appears well-developed and well-nourished. No distress.        No extremis   HENT:   Mouth/Throat: Oropharynx is clear and moist.   Eyes: Conjunctivae are normal. No scleral icterus.   Cardiovascular: Normal rate, normal heart sounds and intact distal pulses.    Pulmonary/Chest: Effort normal. No respiratory distress.   Abdominal: Soft. There is no tenderness.        Flat ,soft all quadrant abdomin   Musculoskeletal: Normal range of motion. She exhibits no edema.   Neurological: She is alert.   Skin: Skin is warm. No rash noted.   Psychiatric: Her behavior is normal. Thought content normal.         MDM     Differential Diagnosis; Clinical Impression; Plan:     Left flank pain, has been [facility] and here but does not follow up. Pain on opposite side of distal stone seen on [facility] CT. Ultrasound here with no cause found.    Consider drug seeking/" hospital shopping"  Amount and/or Complexity of Data Reviewed:   Clinical lab tests:  Reviewed and ordered  Tests in the radiology section of CPT??:  Reviewed and ordered      Procedures    Recent Results (from the past 12 hour(s))   URINE MICROSCOPIC    Collection Time    04/06/11  5:16 PM       Component Value Range    WBC 0-3  0 /HPF    RBC 50-100  0 /HPF    Epithelial cells 5-10  0 /HPF    Bacteria TRACE  0 /HPF    Casts 0  0 /LPF    Crystals 0  0 /LPF    Mucus 0  0 /LPF   DRUG SCREEN, URINE    Collection Time    04/06/11  5:16 PM       Component Value Range    PCP(PHENCYCLIDINE) NEGATIVE       BENZODIAZEPINE POSITIVE      COCAINE NEGATIVE       AMPHETAMINE NEGATIVE       METHADONE NEGATIVE       THC (TH-CANNABINOL) NEGATIVE       OPIATES NEGATIVE       BARBITURATES NEGATIVE         Examination: RENAL ULTRASOUND - [id number] - Apr 06 2011 9:21PM    Reason: acute flank pain, more rect CTs only with intrarenal stones    REPORT:  EXAMINATION: RENAL ULTRASOUND    INDICATION: Right flank pain.    FINDINGS:  Right kidney measures 10.3 cm and displays a nonobstructive right lower pole   5 mm calculus. Left kidney measures 11 cm and contains a upper pole 7 mm   nonobstructive calculus. Bilateral ureters are unremarkable.  Bladder is   unremarkable. The aorta measures 15 mm and the IVC are unremarkable.    IMPRESSION:  Nonobstructive bilateral nephrocalcinosis.         Signing/Reading Doctor: Tomasa Hose PARNES,

## 2011-04-06 NOTE — ED Notes (Signed)
I have reviewed medications, follow up provider options, and discharge instructions with the patient. The patient verbalized understanding. Copy of discharge information given to patient upon discharge. Prescription(s) given to patient. Patient discharged ambulatory in no distress. Patient instructed not to drive while under influence of narcotic pain medications.

## 2011-04-06 NOTE — ED Notes (Signed)
Pt st left flank pain that started a couple of weeks ago. Pt st pain increased today. Pt st dysuria.  Pt st history of kidney.

## 2011-04-07 LAB — DRUG SCREEN, URINE
AMPHETAMINES: NEGATIVE
BARBITURATES: NEGATIVE
BENZODIAZEPINES: POSITIVE
COCAINE: NEGATIVE
METHADONE: NEGATIVE
OPIATES: NEGATIVE
PCP(PHENCYCLIDINE): NEGATIVE
THC (TH-CANNABINOL): NEGATIVE

## 2011-04-07 MED ORDER — OXYCODONE-ACETAMINOPHEN 5 MG-325 MG TAB
5-325 mg | ORAL | Status: AC
Start: 2011-04-07 — End: 2011-04-06
  Administered 2011-04-07: 04:00:00 via ORAL

## 2011-04-07 MED ORDER — ONDANSETRON 8 MG TAB, RAPID DISSOLVE
8 mg | ORAL_TABLET | Freq: Three times a day (TID) | ORAL | Status: DC | PRN
Start: 2011-04-07 — End: 2011-08-15

## 2011-04-07 MED ORDER — ONDANSETRON 8 MG TAB, RAPID DISSOLVE
8 mg | ORAL | Status: AC
Start: 2011-04-07 — End: 2011-04-06
  Administered 2011-04-07: 04:00:00 via ORAL

## 2011-04-07 MED ORDER — OXYCODONE-ACETAMINOPHEN 5 MG-325 MG TAB
5-325 mg | ORAL_TABLET | Freq: Four times a day (QID) | ORAL | Status: DC | PRN
Start: 2011-04-07 — End: 2011-08-15

## 2011-04-07 MED FILL — OXYCODONE-ACETAMINOPHEN 5 MG-325 MG TAB: 5-325 mg | ORAL | Qty: 1

## 2011-04-07 MED FILL — ONDANSETRON 8 MG TAB, RAPID DISSOLVE: 8 mg | ORAL | Qty: 1

## 2011-08-15 LAB — URINE MICROSCOPIC: RBC: >100 /HPF — ABNORMAL HIGH

## 2011-08-15 MED ORDER — PROMETHAZINE 25 MG/ML INJECTION
25 mg/mL | INTRAMUSCULAR | Status: AC
Start: 2011-08-15 — End: 2011-08-15
  Administered 2011-08-15: 19:00:00 via INTRAMUSCULAR

## 2011-08-15 MED ORDER — NITROFURANTOIN (25% MACROCRYSTAL FORM) 100 MG CAP
100 mg | ORAL_CAPSULE | Freq: Two times a day (BID) | ORAL | Status: AC
Start: 2011-08-15 — End: 2011-08-18

## 2011-08-15 MED ORDER — HYDROMORPHONE (PF) 1 MG/ML IJ SOLN
1 mg/mL | INTRAMUSCULAR | Status: AC
Start: 2011-08-15 — End: 2011-08-15
  Administered 2011-08-15: 19:00:00 via INTRAMUSCULAR

## 2011-08-15 MED ORDER — HYDROCODONE-ACETAMINOPHEN 5 MG-325 MG TAB
5-325 mg | ORAL_TABLET | ORAL | Status: DC | PRN
Start: 2011-08-15 — End: 2011-09-09

## 2011-08-15 MED FILL — HYDROMORPHONE (PF) 1 MG/ML IJ SOLN: 1 mg/mL | INTRAMUSCULAR | Qty: 1

## 2011-08-15 MED FILL — PROMETHAZINE 25 MG/ML INJECTION: 25 mg/mL | INTRAMUSCULAR | Qty: 1

## 2011-08-15 NOTE — ED Notes (Signed)
Results reviewed and discussed with pt.

## 2011-08-15 NOTE — Progress Notes (Signed)
Spiritual Care Visit, in the Emergency Department.    Listened at length as patient spoke of her own life, health and faith, and of family needs.  Visit, prayer, with patient at bedside  Affirmed patient's emotions for past life events, affirmed her present faith, and encouraged her continued progress.  Prayed for her healing, for the health of someone close to her, and for her son's life choices.    Signed by Demetrios Isaacs Starla Link., Staff Chaplain

## 2011-08-15 NOTE — ED Notes (Signed)
Pt states "she feels like she is trying to pass a kidney stone. The pain started about 5 am today. It is on the left side."

## 2011-08-15 NOTE — ED Provider Notes (Signed)
Patient is a 46 y.o. female presenting with flank pain. [name]   Flank Pain   This is a new problem. The current episode started 6 to 12 hours ago. The problem has not changed since onset.The problem occurs constantly. Patient reports no work related injury.The pain is associated with no known injury. The pain is present in the left side and middle back. The quality of the pain is described as stabbing. The pain radiates to the left groin. The pain is at a severity of 8/10. The pain is severe. The pain is the same all the time. Pertinent negatives include no fever, no numbness, no abdominal pain, no abdominal swelling, no bowel incontinence, no perianal numbness, no bladder incontinence, no dysuria, no pelvic pain, no leg pain, no paresthesias, no paresis, no tingling and no weakness. She has tried nothing for the symptoms. The treatment provided no relief. Risk factors include history of kidney stones.        Past Medical History   Diagnosis Date   ??? 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 Infectious disease 02/2009      Hx MRSA / E.Coli bacteremia, MRSA wound infection   ??? Factor V Leiden mutation    ??? Chronic kidney disease      kidney stones   ??? Neurological disorder      migraines   ??? Thromboembolus    ??? Psychiatric disorder      depression        Past Surgical History   Procedure Date   ??? Cystoscopy ???   ??? Lithotripsy 2006   ??? Hx vascular access July, 2010     Power Port placed in [place], [place]., removed   ??? Hx appendectomy ???   ??? Hx tonsillectomy    ??? Hx urological      cystoscopex13   ??? Hx cesarean section    ??? Hx tubal ligation          Family History   Problem Relation Age of Onset   ??? Cancer Father      died from colon ca.   ??? Cancer Maternal Grandmother      metastatic breast CA.        History     Social History   ??? Marital Status: DIVORCED     Spouse Name: N/A     Number of Children: N/A   ??? Years of Education: N/A     Occupational History   ??? Not on  file.     Social History Main Topics   ??? Smoking status: Never Smoker    ??? Smokeless tobacco: Never Used   ??? Alcohol Use: Yes   ??? Drug Use: No      narcotic seeking per hospital hx.   ??? Sexually Active: Yes -- Female partner(s)     Birth Control/ Protection: Surgical     Other Topics Concern   ??? Not on file     Social History Narrative   ??? No narrative on file                  ALLERGIES: Ativan; Codeine; Influenza virus vaccines; Iodinated contrast media - iv dye; Ketorolac tromethamine; Morphine; Motrin; Pcn; Pneumococcal vaccine; Pneumovax 23; Prednisone; and Shellfish containing products      Review of Systems   Constitutional: Negative for fever.   Gastrointestinal: Negative for abdominal pain and bowel incontinence.   Genitourinary: Positive  for flank pain. Negative for bladder incontinence, dysuria and pelvic pain.   Neurological: Negative for tingling, weakness, numbness and paresthesias.   All other systems reviewed and are negative.        Filed Vitals:    08/15/11 1457   BP: 125/78   Pulse: 85   Temp: 98.4 ??F (36.9 ??C)   Resp: 16   Height: 5\' 4"  (1.626 m)   Weight: 65.318 kg (144 lb)   SpO2: 98%            Physical Exam   Nursing note and vitals reviewed.  Constitutional: She is oriented to person, place, and time. She appears well-developed and well-nourished. No distress.   HENT:   Head: Normocephalic and atraumatic.   Eyes: Conjunctivae and EOM are normal. Pupils are equal, round, and reactive to light.   Pulmonary/Chest: Effort normal.   Abdominal: Soft. She exhibits no distension. There is no tenderness.        Positive left CVAT   Musculoskeletal: Normal range of motion. She exhibits no edema and no tenderness.   Neurological: She is alert and oriented to person, place, and time.   Skin: Skin is warm and dry.   Psychiatric: She has a normal mood and affect. Her behavior is normal.        MDM     Amount and/or Complexity of Data Reviewed:   Clinical lab tests:  Ordered and reviewed  Tests in the  radiology section of CPT??:  Ordered and reviewed   Independant visualization of image, tracing, or specimen:  Yes  Risk of Significant Complications, Morbidity, and/or Mortality:   Presenting problems:  Moderate  Diagnostic procedures:  Moderate  Management options:  Moderate  Progress:   Patient progress:  Stable      Procedures

## 2011-08-15 NOTE — ED Notes (Signed)
I have reviewed discharge instructions with the patient.  The patient verbalized understanding.    States she has a ride home.  No questions.  Has prescriptions

## 2011-09-09 NOTE — ED Provider Notes (Signed)
HPI Comments: Pt. With H/o kidney stones in past.  Recently trying to pass one on left per pt. Also h/o gallbladder severe prolonged contraction causing pain in 2006.  ERCP at that time.  This feels similar to that per pt.     Patient is a 46 y.o. female presenting with abdominal pain. [name] No language interpreter was used.   Abdominal Pain   This is a new problem. The current episode started 3 to 5 hours ago. The problem occurs constantly. Progression since onset: wax and wane. The pain is associated with vomiting and eating. The pain is located in the RUQ. The quality of the pain is cramping. The pain is moderate. Associated symptoms include nausea and vomiting. Pertinent negatives include no fever, no diarrhea, no constipation, no dysuria, no hematuria, no headaches, no chest pain and no back pain. Nothing worsens the pain. The pain is relieved by nothing. Past workup includes CT scan and ultrasound. Her past medical history is significant for kidney stones. The patient's surgical history includes appendectomy.       Past Medical History   Diagnosis Date   ??? 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 Factor V Leiden mutation    ??? Chronic kidney disease      kidney stones   ??? Neurological disorder      migraines   ??? Psychiatric disorder      depression   ??? Infectious disease 02/2009      Hx MRSA / E.Coli bacteremia, MRSA wound infection   ??? Thromboembolus      Factor 5        Past Surgical History   Procedure Date   ??? Cystoscopy ???   ??? Lithotripsy 2006   ??? Hx vascular access July, 2010     Power Port placed in [place], [place] removed   ??? Hx tonsillectomy    ??? Hx urological      cystoscopex13   ??? Hx appendectomy ???   ??? Hx cesarean section    ??? Hx tubal ligation    ??? Pr vascular surgery procedure unlist      Port/removal         Family History   Problem Relation Age of Onset   ??? Cancer Father      died from colon ca.   ??? Cancer Maternal Grandmother      metastatic  breast CA.        History     Social History   ??? Marital Status: DIVORCED     Spouse Name: N/A     Number of Children: N/A   ??? Years of Education: N/A     Occupational History   ??? Not on file.     Social History Main Topics   ??? Smoking status: Never Smoker    ??? Smokeless tobacco: Never Used   ??? Alcohol Use: Yes   ??? Drug Use: No      narcotic seeking per hospital hx.   ??? Sexually Active: Yes -- Female partner(s)     Birth Control/ Protection: Surgical     Other Topics Concern   ??? Not on file     Social History Narrative   ??? No narrative on file                  ALLERGIES: Ativan; Codeine; Influenza virus vaccines; Iodinated contrast media - iv dye; Ketorolac tromethamine; Morphine; Motrin;  Pcn; Pneumococcal vaccine; Pneumovax 23; Prednisone; and Shellfish containing products      Review of Systems   Constitutional: Negative for fever and chills.   HENT: Negative for neck pain and neck stiffness.    Eyes: Negative for pain and redness.   Respiratory: Negative for chest tightness, shortness of breath and wheezing.    Cardiovascular: Negative for chest pain and leg swelling.   Gastrointestinal: Positive for nausea, vomiting and abdominal pain (RUQ). Negative for diarrhea, constipation and blood in stool.   Genitourinary: Negative for dysuria, hematuria, vaginal bleeding and vaginal discharge.   Musculoskeletal: Negative for back pain and gait problem.   Skin: Negative for color change and rash.   Neurological: Negative for weakness and headaches.       Filed Vitals:    09/09/11 1856   BP: 110/82   Pulse: 98   Temp: 98.3 ??F (36.8 ??C)   Resp: 20   Height: 5\' 4"  (1.626 m)   Weight: 63.504 kg (140 lb)   SpO2: 100%            Physical Exam   Constitutional: She appears well-developed and well-nourished.   HENT:   Head: Normocephalic and atraumatic.   Eyes: EOM are normal. Pupils are equal, round, and reactive to light.   Neck: Normal range of motion. Neck supple.   Cardiovascular: Normal rate and regular rhythm.    No murmur  heard.  Pulmonary/Chest: Effort normal and breath sounds normal. She has no wheezes.   Abdominal: Soft. Bowel sounds are normal. She exhibits no distension and no mass. There is tenderness (RUQ). There is guarding (mild). There is no rebound.   Musculoskeletal: Normal range of motion. She exhibits no edema.   Neurological: She is alert.   Skin: Skin is warm and dry.        MDM     Amount and/or Complexity of Data Reviewed:   Clinical lab tests:  Ordered and reviewed  Tests in the medicine section of the CPT??:  Ordered and reviewed  Risk of Significant Complications, Morbidity, and/or Mortality:   Presenting problems:  Low  Diagnostic procedures:  Low  Management options:  Low  Progress:   Patient progress:  Stable      Procedures    Reviewed multiple past CT and U/S.  Bilateral renal stones in past with no obstruction on recent CT.     Results Include:    Recent Results (from the past 24 hour(s))   URINE MICROSCOPIC    Collection Time    09/09/11  7:30 PM       Component Value Range    WBC 5-10  0 /HPF    RBC 0-3  0 /HPF    Epithelial cells 5-10  0 /HPF    Bacteria TRACE  0 /HPF    Casts 3-5 HYALINE  0 /LPF   CBC WITH AUTOMATED DIFF    Collection Time    09/09/11  8:45 PM       Component Value Range    WBC 8.4  4.3 - 11.1 K/uL    RBC 3.93 (*) 4.05 - 5.25 M/uL    HGB 12.4  11.7 - 15.4 g/dL    HCT 21.3  08.6 - 57.8 %    MCV 93.1  79.6 - 97.8 FL    MCH 31.6  26.1 - 32.9 PG    MCHC 33.9  31.4 - 35.0 g/dL    RDW 46.9  62.9 - 52.8 %  PLATELET 258  150 - 450 K/uL    MPV 10.3 (*) 10.8 - 14.1 FL    DF AUTOMATED      NEUTROPHILS 61  43 - 78 %    LYMPHOCYTES 29  13 - 44 %    MONOCYTES 6  4.0 - 12.0 %    EOSINOPHILS 4  0.5 - 7.8 %    BASOPHILS 0  0.0 - 2.0 %    IMMATURE GRANULOCYTES 0.1  0.0 - 5.0 %    ABS. NEUTROPHILS 5.1  1.7 - 8.2 K/UL    ABS. LYMPHOCYTES 2.4  0.5 - 4.6 K/UL    ABS. MONOCYTES 0.5  0.1 - 1.3 K/UL    ABS. EOSINOPHILS 0.3  0.0 - 0.8 K/UL    ABS. BASOPHILS 0.0  0.0 - 0.2 K/UL    ABS. IMM. GRANS. 0.0  0.0 - 0.5  K/UL   METABOLIC PANEL, COMPREHENSIVE    Collection Time    09/09/11  8:45 PM       Component Value Range    Sodium 141  136 - 145 MMOL/L    Potassium 4.6  3.5 - 5.1 MMOL/L    Chloride 104  98 - 107 MMOL/L    CO2 27  21 - 32 MMOL/L    Anion gap 10  7 - 16 mmol/L    Glucose 90  65 - 100 MG/DL    BUN 17  6 - 23 MG/DL    Creatinine 4.09 (*) 0.6 - 1.0 MG/DL    GFR est-AA >81  >19 ml/min/1.37m2    GFR est non-AA 57 (*) >60 ml/min/1.85m2    Calcium 9.6  8.3 - 10.4 MG/DL    Bilirubin, total 0.3  0.2 - 1.1 MG/DL    ALT 25  12 - 65 U/L    AST 25  15 - 37 U/L    Alk. phosphatase 119  50 - 136 U/L    Protein, total 7.6  6.3 - 8.2 g/dL    Albumin 4.4  3.5 - 5.0 g/dL    Globulin 3.2  2.3 - 3.5 g/dL    A-G Ratio 1.4  1.2 - 3.5     LIPASE    Collection Time    09/09/11  8:45 PM       Component Value Range    Lipase 252  73 - 393 U/L   PROTHROMBIN TIME    Collection Time    09/09/11  8:45 PM       Component Value Range    Prothrombin time 20.5 (*) 8.6 - 12.2 sec    INR 2.0 (*) 0.9 - 1.2     PTT    Collection Time    09/09/11  8:45 PM       Component Value Range    aPTT 34.0 (*) 25.3 - 32.9 SEC       Pt. Refusing PO lortab stating she needs IM Dilaudid or IV diluadid for pain.

## 2011-09-09 NOTE — ED Notes (Signed)
Report to oncoming nurse.

## 2011-09-09 NOTE — ED Notes (Signed)
PMD-Dr Valetta Fuller. Pt c/o right upper abdominal pain intermittently today. Then vomiting after eating a couple of bites of a hamburger.

## 2011-09-09 NOTE — ED Notes (Signed)
Patient refused ordered Norco 7.5mg  po.  Patient requests Dilaudid or Demerol IV or IM.  Patient reports she has been vomiting and is afraid she will vomit the Norco po.  MD made aware.

## 2011-09-09 NOTE — ED Notes (Signed)
I have reviewed discharge instructions with the patient.  The patient verbalized understanding.  Patient's boyfriend is driving her home.

## 2011-09-10 LAB — METABOLIC PANEL, COMPREHENSIVE
A-G Ratio: 1.4 (ref 1.2–3.5)
ALT (SGPT): 25 U/L (ref 12–65)
AST (SGOT): 25 U/L (ref 15–37)
Albumin: 4.4 g/dL (ref 3.5–5.0)
Alk. phosphatase: 119 U/L (ref 50–136)
Anion gap: 10 mmol/L (ref 7–16)
BUN: 17 MG/DL (ref 6–23)
Bilirubin, total: 0.3 MG/DL (ref 0.2–1.1)
CO2: 27 MMOL/L (ref 21–32)
Calcium: 9.6 MG/DL (ref 8.3–10.4)
Chloride: 104 MMOL/L (ref 98–107)
Creatinine: 1.1 MG/DL — ABNORMAL HIGH (ref 0.6–1.0)
GFR est AA: >60 mL/min/{1.73_m2} (ref >60)
GFR est non-AA: 57 mL/min/{1.73_m2} — ABNORMAL LOW (ref >60)
Globulin: 3.2 g/dL (ref 2.3–3.5)
Glucose: 90 MG/DL (ref 65–100)
Potassium: 4.6 MMOL/L (ref 3.5–5.1)
Protein, total: 7.6 g/dL (ref 6.3–8.2)
Sodium: 141 MMOL/L (ref 136–145)

## 2011-09-10 LAB — CBC WITH AUTOMATED DIFF
ABS. BASOPHILS: 0 10*3/uL (ref 0.0–0.2)
ABS. EOSINOPHILS: 0.3 10*3/uL (ref 0.0–0.8)
ABS. IMM. GRANS.: 0 10*3/uL (ref 0.0–0.5)
ABS. LYMPHOCYTES: 2.4 10*3/uL (ref 0.5–4.6)
ABS. MONOCYTES: 0.5 10*3/uL (ref 0.1–1.3)
ABS. NEUTROPHILS: 5.1 10*3/uL (ref 1.7–8.2)
BASOPHILS: 0 % (ref 0.0–2.0)
EOSINOPHILS: 4 % (ref 0.5–7.8)
HCT: 36.6 % (ref 35.8–46.3)
HGB: 12.4 g/dL (ref 11.7–15.4)
IMMATURE GRANULOCYTES: 0.1 % (ref 0.0–5.0)
LYMPHOCYTES: 29 % (ref 13–44)
MCH: 31.6 PG (ref 26.1–32.9)
MCHC: 33.9 g/dL (ref 31.4–35.0)
MCV: 93.1 FL (ref 79.6–97.8)
MONOCYTES: 6 % (ref 4.0–12.0)
MPV: 10.3 FL — ABNORMAL LOW (ref 10.8–14.1)
NEUTROPHILS: 61 % (ref 43–78)
PLATELET: 258 10*3/uL (ref 150–450)
RBC: 3.93 M/uL — ABNORMAL LOW (ref 4.05–5.25)
RDW: 12.9 % (ref 11.9–14.6)
WBC: 8.4 10*3/uL (ref 4.3–11.1)

## 2011-09-10 LAB — URINE MICROSCOPIC

## 2011-09-10 LAB — PROTHROMBIN TIME + INR
INR: 2 — ABNORMAL HIGH (ref 0.9–1.2)
Prothrombin time: 20.5 s — ABNORMAL HIGH (ref 8.6–12.2)

## 2011-09-10 LAB — PTT: aPTT: 34 s — ABNORMAL HIGH (ref 25.3–32.9)

## 2011-09-10 LAB — LIPASE: Lipase: 252 U/L (ref 73–393)

## 2011-09-10 MED ORDER — OXYCODONE-ACETAMINOPHEN 5 MG-325 MG TAB
5-325 mg | ORAL_TABLET | ORAL | Status: DC | PRN
Start: 2011-09-10 — End: 2011-10-12

## 2011-09-10 MED ORDER — ONDANSETRON HCL 4 MG TAB
4 mg | ORAL_TABLET | Freq: Three times a day (TID) | ORAL | Status: DC | PRN
Start: 2011-09-10 — End: 2011-10-12

## 2011-09-10 MED ADMIN — HYDROcodone-acetaminophen (NORCO) 7.5-325 mg per tablet 1 Tab: ORAL | @ 02:00:00 | NDC 00406036662

## 2011-09-10 MED ADMIN — sodium chloride 0.9 % bolus infusion 1,000 mL: INTRAVENOUS | @ 01:00:00 | NDC 00409798309

## 2011-09-10 MED ADMIN — ondansetron (ZOFRAN) injection 4 mg: INTRAVENOUS | @ 01:00:00 | NDC 00409475503

## 2011-09-10 MED FILL — HYDROCODONE-ACETAMINOPHEN 7.5 MG-325 MG TAB: ORAL | Qty: 1

## 2011-09-10 MED FILL — ONDANSETRON (PF) 4 MG/2 ML INJECTION: 4 mg/2 mL | INTRAMUSCULAR | Qty: 2

## 2011-10-12 LAB — URINE MICROSCOPIC
Bacteria: 0 /HPF
RBC: >100 /HPF — ABNORMAL HIGH

## 2011-10-12 MED ADMIN — ondansetron (ZOFRAN ODT) tablet 8 mg: ORAL | NDC 68462015840

## 2011-10-12 MED ADMIN — HYDROmorphone (PF) (DILAUDID) injection 1 mg: INTRAMUSCULAR | NDC 00409255201

## 2011-10-12 MED FILL — ONDANSETRON 8 MG TAB, RAPID DISSOLVE: 8 mg | ORAL | Qty: 1

## 2011-10-12 MED FILL — HYDROMORPHONE (PF) 1 MG/ML IJ SOLN: 1 mg/mL | INTRAMUSCULAR | Qty: 1

## 2011-10-12 NOTE — ED Notes (Signed)
I have reviewed discharge instructions with the patient.  The patient verbalized understanding. Pt ambulatory to car. Pt home with family.

## 2011-10-12 NOTE — ED Provider Notes (Addendum)
Patient is a 46 y.o. female presenting with flank pain. [name]   Flank Pain   This is a new problem. The current episode started 6 to 12 hours ago. The problem has not changed since onset.The problem occurs constantly. Associated with: Pt states she ahs a hx of kidney stones and pain feels like a stone. The pain is present in the right side. The quality of the pain is described as stabbing. Radiates to: Radiates around flank to right abdominal area. The pain is moderate. Exacerbated by: Nothing. Associated symptoms include dysuria. Pertinent negatives include no chest pain, no fever, no numbness, no headaches, no abdominal pain, no pelvic pain, no leg pain, no paresthesias, no paresis, no tingling and no weakness. She has tried nothing for the symptoms.        Past Medical History   Diagnosis Date   ??? 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 Factor V Leiden mutation    ??? Chronic kidney disease      kidney stones   ??? Neurological disorder      migraines   ??? Psychiatric disorder      depression   ??? Infectious disease 02/2009      Hx MRSA / E.Coli bacteremia, MRSA wound infection   ??? Thromboembolus      Factor 5        Past Surgical History   Procedure Date   ??? Cystoscopy ???   ??? Lithotripsy 2006   ??? Hx vascular access July, 2010     Power Port placed in [place], [place] removed   ??? Hx tonsillectomy    ??? Hx urological      cystoscopex13   ??? Hx appendectomy ???   ??? Hx cesarean section    ??? Hx tubal ligation    ??? Pr vascular surgery procedure unlist      Port/removal         Family History   Problem Relation Age of Onset   ??? Cancer Father      died from colon ca.   ??? Cancer Maternal Grandmother      metastatic breast CA.        History     Social History   ??? Marital Status: DIVORCED     Spouse Name: N/A     Number of Children: N/A   ??? Years of Education: N/A     Occupational History   ??? Not on file.     Social History Main Topics   ??? Smoking status: Never Smoker    ???  Smokeless tobacco: Never Used   ??? Alcohol Use: Yes   ??? Drug Use: No      narcotic seeking per hospital hx.   ??? Sexually Active: Yes -- Female partner(s)     Birth Control/ Protection: Surgical     Other Topics Concern   ??? Not on file     Social History Narrative   ??? No narrative on file                  ALLERGIES: Ativan; Codeine; Influenza virus vaccines; Iodinated contrast media - iv dye; Ketorolac tromethamine; Morphine; Motrin; Pcn; Pneumococcal vaccine; Pneumovax 23; Prednisone; and Shellfish containing products      Review of Systems   Constitutional: Negative for fever.   HENT: Negative for neck pain.    Eyes: Negative.    Respiratory: Negative for chest tightness and  shortness of breath.    Cardiovascular: Negative for chest pain and leg swelling.   Gastrointestinal: Positive for nausea. Negative for vomiting, abdominal pain, diarrhea, constipation and blood in stool.   Genitourinary: Positive for dysuria, hematuria and flank pain. Negative for pelvic pain.   Musculoskeletal: Negative for back pain.   Skin: Negative for rash.   Neurological: Negative for tingling, weakness, numbness, headaches and paresthesias.   All other systems reviewed and are negative.        Filed Vitals:    10/12/11 2000 10/12/11 2019 10/12/11 2024 10/12/11 2044   BP:   131/86 120/69   Pulse: 89 89 85 85   Temp:       Resp:       Height:       Weight:       SpO2: 97% 91% 95% 95%            Physical Exam   Nursing note and vitals reviewed.  Constitutional: She is oriented to person, place, and time. She appears well-developed and well-nourished.   HENT:   Head: Normocephalic and atraumatic.   Mouth/Throat: Oropharynx is clear and moist.   Eyes: EOM are normal.   Neck: Normal range of motion. Neck supple.   Cardiovascular: Normal rate, regular rhythm, normal heart sounds and intact distal pulses.         + 2/2 femoral pulses B/S   Pulmonary/Chest: Breath sounds normal.   Abdominal: Soft. Bowel sounds are normal. There is no tenderness.  There is no rebound and no guarding.        + Right CVAT   Musculoskeletal: Normal range of motion.        + Right CVAT   Neurological: She is alert and oriented to person, place, and time. No cranial nerve deficit.   Skin: Skin is warm. No rash noted.        MDM     Differential Diagnosis; Clinical Impression; Plan:     Renal Colic/ Ureterolithiasis/ UTI/ Pyelonephritis/ Kidney Stone  Amount and/or Complexity of Data Reviewed:   Clinical lab tests:  Ordered and reviewed  Tests in the radiology section of CPT??:  Ordered and reviewed      Procedures    Pt states she is improved after medicines. Pt aware of all results and confirms history of having appendectomy. Pt to follow up with Urologist on Tuesday. Pt requesting Ultram for pain.

## 2011-10-12 NOTE — ED Notes (Signed)
Report to Jon, RN

## 2011-10-12 NOTE — ED Notes (Signed)
Report rcvd from Danielle RN.

## 2011-10-13 LAB — HCG URINE, QL. - POC: Pregnancy test,urine (POC): NEGATIVE

## 2011-11-19 LAB — URINE MICROSCOPIC

## 2011-11-19 LAB — METABOLIC PANEL, COMPREHENSIVE
A-G Ratio: 1.1 — ABNORMAL LOW (ref 1.2–3.5)
ALT (SGPT): 18 U/L (ref 12–65)
AST (SGOT): 17 U/L (ref 15–37)
Albumin: 4 g/dL (ref 3.5–5.0)
Alk. phosphatase: 116 U/L (ref 50–136)
Anion gap: 8 mmol/L (ref 7–16)
BUN: 16 MG/DL (ref 6–23)
Bilirubin, total: 0.3 MG/DL (ref 0.2–1.1)
CO2: 25 MMOL/L (ref 21–32)
Calcium: 9.4 MG/DL (ref 8.3–10.4)
Chloride: 110 MMOL/L — ABNORMAL HIGH (ref 98–107)
Creatinine: 0.93 MG/DL (ref 0.6–1.0)
GFR est AA: >60 mL/min/{1.73_m2} (ref >60)
GFR est non-AA: >60 mL/min/{1.73_m2} (ref >60)
Globulin: 3.7 g/dL — ABNORMAL HIGH (ref 2.3–3.5)
Glucose: 117 MG/DL — ABNORMAL HIGH (ref 65–100)
Potassium: 3.7 MMOL/L (ref 3.5–5.1)
Protein, total: 7.7 g/dL (ref 6.3–8.2)
Sodium: 143 MMOL/L (ref 136–145)

## 2011-11-19 LAB — CBC WITH AUTOMATED DIFF
ABS. BASOPHILS: 0 10*3/uL (ref 0.0–0.2)
ABS. EOSINOPHILS: 0.3 10*3/uL (ref 0.0–0.8)
ABS. IMM. GRANS.: 0 10*3/uL (ref 0.0–0.5)
ABS. LYMPHOCYTES: 1.6 10*3/uL (ref 0.5–4.6)
ABS. MONOCYTES: 0.4 10*3/uL (ref 0.1–1.3)
ABS. NEUTROPHILS: 3.4 10*3/uL (ref 1.7–8.2)
BASOPHILS: 0 % (ref 0.0–2.0)
EOSINOPHILS: 5 % (ref 0.5–7.8)
HCT: 36.2 % (ref 35.8–46.3)
HGB: 12.4 g/dL (ref 11.7–15.4)
IMMATURE GRANULOCYTES: 0.2 % (ref 0.0–5.0)
LYMPHOCYTES: 27 % (ref 13–44)
MCH: 31 PG (ref 26.1–32.9)
MCHC: 34.3 g/dL (ref 31.4–35.0)
MCV: 90.5 FL (ref 79.6–97.8)
MONOCYTES: 7 % (ref 4.0–12.0)
MPV: 10.4 FL — ABNORMAL LOW (ref 10.8–14.1)
NEUTROPHILS: 61 % (ref 43–78)
PLATELET: 226 10*3/uL (ref 150–450)
RBC: 4 M/uL — ABNORMAL LOW (ref 4.05–5.25)
RDW: 14 % (ref 11.9–14.6)
WBC: 5.7 10*3/uL (ref 4.3–11.1)

## 2011-11-19 LAB — HCG URINE, QL. - POC: Pregnancy test,urine (POC): NEGATIVE

## 2011-11-19 MED ADMIN — ondansetron (ZOFRAN) injection 4 mg: INTRAVENOUS | @ 18:00:00 | NDC 00409475503

## 2011-11-19 MED ADMIN — HYDROmorphone (PF) (DILAUDID) injection 0.5 mg: INTRAVENOUS | @ 21:00:00 | NDC 00409255201

## 2011-11-19 MED ADMIN — heparin (porcine) pf 100 unit/mL: @ 23:00:00

## 2011-11-19 MED ADMIN — sodium chloride (NS) flush 5-10 mL: INTRAVENOUS | @ 21:00:00 | NDC 87701099893

## 2011-11-19 MED ADMIN — HYDROmorphone (PF) (DILAUDID) injection 1 mg: INTRAVENOUS | @ 18:00:00 | NDC 00409255201

## 2011-11-19 MED ADMIN — ciprofloxacin (CIPRO) 400 mg IVPB (premix): INTRAVENOUS | @ 21:00:00 | NDC 00409477702

## 2011-11-19 MED ADMIN — sodium chloride (NS) flush 5-10 mL: INTRAVENOUS | @ 23:00:00 | NDC 87701099893

## 2011-11-19 MED ADMIN — diatrizoate meglumine & sodium (MD-GASTROVIEW,GASTROGRAFIN) 66-10 % contrast solution 30 mL: ORAL | @ 18:00:00 | NDC 00019481604

## 2011-11-19 MED FILL — CIPROFLOXACIN IN D5W 400 MG/200 ML IV PIGGY BACK: 400 mg/200 mL | INTRAVENOUS | Qty: 200

## 2011-11-19 MED FILL — HEPARIN LOCK FLUSH (PORCINE) (PF) 100 UNIT/ML INTRAVENOUS SYRINGE: 100 unit/mL | INTRAVENOUS | Qty: 3

## 2011-11-19 MED FILL — MD-GASTROVIEW 66 %-10 % ORAL SOLUTION: 66-10 % | ORAL | Qty: 30

## 2011-11-19 MED FILL — HYDROMORPHONE (PF) 1 MG/ML IJ SOLN: 1 mg/mL | INTRAMUSCULAR | Qty: 1

## 2011-11-19 MED FILL — ONDANSETRON (PF) 4 MG/2 ML INJECTION: 4 mg/2 mL | INTRAMUSCULAR | Qty: 2

## 2011-11-19 NOTE — ED Notes (Signed)
Dr. Olena Leatherwood in to eval pt

## 2011-11-19 NOTE — ED Notes (Signed)
Pt was notified that the ct was canceled and waiting on the MD to talk with her. Pt states "good maybe I will get out in time to catch the bus."

## 2011-11-19 NOTE — ED Notes (Signed)
Pt states "she would like more pain medication. She when to the bathroom and the pain got worse." Dr. Olena Leatherwood notified

## 2011-11-19 NOTE — ED Notes (Signed)
Pharmacy called for medication

## 2011-11-19 NOTE — ED Provider Notes (Signed)
Patient is a 46 y.o. female presenting with abdominal pain. [name]   Abdominal Pain   This is a new problem. The current episode started 6 to 12 hours ago. The problem occurs constantly. The problem has not changed since onset.Associated with: nausea. The pain is located in the LLQ. The quality of the pain is aching. The pain is at a severity of 8/10. The pain is moderate. Associated symptoms include nausea. Pertinent negatives include no fever, no belching, no diarrhea, no flatus, no hematochezia, no melena, no vomiting, no constipation, no dysuria, no frequency, no hematuria, no headaches and no back pain. Nothing worsens the pain. The pain is relieved by nothing. Past workup includes CT scan. Her past medical history is significant for kidney stones. The patient's surgical history includes appendectomy.BTL       Past Medical History   Diagnosis Date   ??? 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 Factor V Leiden mutation    ??? Chronic kidney disease      kidney stones   ??? Neurological disorder      migraines   ??? Psychiatric disorder      depression   ??? Infectious disease 02/2009      Hx MRSA / E.Coli bacteremia, MRSA wound infection   ??? Thromboembolus      Factor 5        Past Surgical History   Procedure Date   ??? Cystoscopy ???   ??? Lithotripsy 2006   ??? Hx vascular access July, 2010     Power Port placed in [place], [place] removed   ??? Hx tonsillectomy    ??? Hx urological      cystoscopex13   ??? Hx appendectomy ???   ??? Hx cesarean section    ??? Hx tubal ligation    ??? Pr vascular surgery procedure unlist      Port/removal         Family History   Problem Relation Age of Onset   ??? Cancer Father      died from colon ca.   ??? Cancer Maternal Grandmother      metastatic breast CA.        History     Social History   ??? Marital Status: DIVORCED     Spouse Name: N/A     Number of Children: N/A   ??? Years of Education: N/A     Occupational History   ??? Not on file.     Social History  Main Topics   ??? Smoking status: Never Smoker    ??? Smokeless tobacco: Never Used   ??? Alcohol Use: Yes   ??? Drug Use: No      narcotic seeking per hospital hx.   ??? Sexually Active: Yes -- Female partner(s)     Birth Control/ Protection: Surgical     Other Topics Concern   ??? Not on file     Social History Narrative   ??? No narrative on file                  ALLERGIES: Ativan; Codeine; Influenza virus vaccines; Iodinated contrast media - iv dye; Ketorolac tromethamine; Morphine; Motrin; Pcn; Pneumococcal vaccine; Pneumovax 23; Prednisone; and Shellfish containing products      Review of Systems   Constitutional: Negative.  Negative for fever.   HENT: Negative.    Eyes: Negative.    Respiratory: Negative.  Cardiovascular: Negative.    Gastrointestinal: Positive for nausea and abdominal pain. Negative for vomiting, diarrhea, constipation, melena, hematochezia, abdominal distention and flatus.   Genitourinary: Negative for dysuria, urgency, frequency, hematuria, flank pain, decreased urine volume, vaginal bleeding, vaginal discharge, difficulty urinating, vaginal pain, menstrual problem and pelvic pain.   Musculoskeletal: Negative.  Negative for back pain.   Neurological: Negative for headaches.   All other systems reviewed and are negative.        Filed Vitals:    11/19/11 1259   BP: 126/68   Pulse: 90   Temp: 98.6 ??F (37 ??C)   Resp: 16   Height: 5\' 4"  (1.626 m)   Weight: 59.875 kg (132 lb)   SpO2: 98%            Physical Exam   Nursing note and vitals reviewed.  Constitutional: She is oriented to person, place, and time. She appears well-developed and well-nourished. No distress.   HENT:   Head: Normocephalic and atraumatic.   Neck: Normal range of motion. Neck supple. No JVD present. No tracheal deviation present. No thyromegaly present.   Cardiovascular: Normal rate, regular rhythm, normal heart sounds and intact distal pulses.    Pulmonary/Chest: Effort normal and breath sounds normal.   Abdominal: Soft. Bowel sounds  are normal. She exhibits no distension and no mass. There is tenderness (LLQ). There is no rebound and no guarding.   Musculoskeletal: Normal range of motion.   Lymphadenopathy:     She has no cervical adenopathy.   Neurological: She is alert and oriented to person, place, and time. She has normal reflexes.   Skin: Skin is warm and dry. No rash noted. She is not diaphoretic. No erythema. No pallor.   Psychiatric: She has a normal mood and affect. Her behavior is normal. Judgment and thought content normal.        MDM     Differential Diagnosis; Clinical Impression; Plan:     IMPRESSION: HEMORRHAGIC CYSTITIS      Procedures    Recent Results (from the past 24 hour(s))   URINE MICROSCOPIC    Collection Time    11/19/11  1:00 PM       Component Value Range    WBC 10-20  0 /HPF    RBC 50-100  0 /HPF    Epithelial cells 10-20  0 /HPF    Bacteria 1+ (*) 0 /HPF    Casts 5-10 HYALINE  0 /LPF   CBC WITH AUTOMATED DIFF    Collection Time    11/19/11  1:19 PM       Component Value Range    WBC 5.7  4.3 - 11.1 K/uL    RBC 4.00 (*) 4.05 - 5.25 M/uL    HGB 12.4  11.7 - 15.4 g/dL    HCT 16.1  09.6 - 04.5 %    MCV 90.5  79.6 - 97.8 FL    MCH 31.0  26.1 - 32.9 PG    MCHC 34.3  31.4 - 35.0 g/dL    RDW 40.9  81.1 - 91.4 %    PLATELET 226  150 - 450 K/uL    MPV 10.4 (*) 10.8 - 14.1 FL    DF AUTOMATED      NEUTROPHILS 61  43 - 78 %    LYMPHOCYTES 27  13 - 44 %    MONOCYTES 7  4.0 - 12.0 %    EOSINOPHILS 5  0.5 - 7.8 %    BASOPHILS  0  0.0 - 2.0 %    IMMATURE GRANULOCYTES 0.2  0.0 - 5.0 %    ABS. NEUTROPHILS 3.4  1.7 - 8.2 K/UL    ABS. LYMPHOCYTES 1.6  0.5 - 4.6 K/UL    ABS. MONOCYTES 0.4  0.1 - 1.3 K/UL    ABS. EOSINOPHILS 0.3  0.0 - 0.8 K/UL    ABS. BASOPHILS 0.0  0.0 - 0.2 K/UL    ABS. IMM. GRANS. 0.0  0.0 - 0.5 K/UL   METABOLIC PANEL, COMPREHENSIVE    Collection Time    11/19/11  1:19 PM       Component Value Range    Sodium 143  136 - 145 MMOL/L    Potassium 3.7  3.5 - 5.1 MMOL/L    Chloride 110 (*) 98 - 107 MMOL/L    CO2 25  21 - 32  MMOL/L    Anion gap 8  7 - 16 mmol/L    Glucose 117 (*) 65 - 100 MG/DL    BUN 16  6 - 23 MG/DL    Creatinine 1.61  0.6 - 1.0 MG/DL    GFR est AA >09  >60 ml/min/1.55m2    GFR est non-AA >60  >60 ml/min/1.34m2    Calcium 9.4  8.3 - 10.4 MG/DL    Bilirubin, total 0.3  0.2 - 1.1 MG/DL    ALT 18  12 - 65 U/L    AST 17  15 - 37 U/L    Alk. phosphatase 116  50 - 136 U/L    Protein, total 7.7  6.3 - 8.2 g/dL    Albumin 4.0  3.5 - 5.0 g/dL    Globulin 3.7 (*) 2.3 - 3.5 g/dL    A-G Ratio 1.1 (*) 1.2 - 3.5     HCG URINE, QL. - POC    Collection Time    11/19/11  1:47 PM       Component Value Range    Pregnancy test,urine (POC) NEGATIVE   NEGATIVE       THE PT HAD A UNREMARKABLE CT OF HER ABD/PELVIS ON 10/12/11. HER LAB RESULT IS CONSISTENT WITH A HEMORRHAGIC CYSTITIS WHICH WILL BE TREATED. THE PT IS STABLE TO BE D/C OME WITH F/U WITH HER PCP IN 3-4 DAYS.

## 2011-11-19 NOTE — ED Notes (Signed)
Pt ambulatory to bathroom. Pt nad

## 2011-11-19 NOTE — ED Notes (Signed)
[facility] was called to transport pt

## 2011-11-19 NOTE — ED Notes (Signed)
Pt states "she is comfortable right now. The pain is about 4/10"

## 2011-11-19 NOTE — ED Notes (Signed)
Patient finished contrast.  CT notified.

## 2011-11-19 NOTE — ED Notes (Signed)
Patient given contrast to drink.  Given call bell and instructed to call when finished.

## 2011-11-19 NOTE — ED Notes (Signed)
I have reviewed discharge instructions with the patient.  The patient verbalized understanding. 2 prescriptions given to pt. Pt ambulatory to lobby to wait for ride. Pt not in any distress.

## 2011-11-19 NOTE — ED Notes (Signed)
Dr. Olena Leatherwood states "cancel the ct. She just had one on the first and it was negative." Ct was called and notified

## 2011-11-19 NOTE — ED Notes (Signed)
Left lower abd pain since last night. Pt states she has been nauseated but denies vomiting or diarrhea.

## 2012-01-11 MED ADMIN — meperidine (DEMEROL) injection 50 mg: INTRAMUSCULAR | @ 18:00:00 | NDC 00641605301

## 2012-01-11 MED ADMIN — meperidine (DEMEROL) injection 25 mg: INTRAMUSCULAR | @ 20:00:00 | NDC 00641605201

## 2012-01-11 MED ADMIN — ondansetron (ZOFRAN) injection 4 mg: INTRAVENOUS | @ 18:00:00 | NDC 00409475503

## 2012-01-11 MED ADMIN — acetaminophen (TYLENOL) tablet 1,000 mg: ORAL | @ 20:00:00 | NDC 00904198880

## 2012-01-11 MED FILL — DEMEROL (PF) 25 MG/ML INJECTION SYRINGE: 25 mg/mL | INTRAMUSCULAR | Qty: 1

## 2012-01-11 MED FILL — ONDANSETRON (PF) 4 MG/2 ML INJECTION: 4 mg/2 mL | INTRAMUSCULAR | Qty: 2

## 2012-01-11 MED FILL — MAPAP EXTRA STRENGTH 500 MG TABLET: 500 mg | ORAL | Qty: 2

## 2012-01-11 MED FILL — MEPERIDINE (PF) 50 MG/ML IJ SOLN: 50 mg/mL | INTRAMUSCULAR | Qty: 1

## 2012-01-11 NOTE — ED Provider Notes (Signed)
Patient is a 46 y.o. female presenting with migraines. [name]   Migraine   This is a chronic problem. The current episode started yesterday. The problem occurs constantly. The headache is aggravated by bright light. Associated symptoms include malaise/fatigue. She has tried darkened room for the symptoms. The treatment provided no relief.        Past Medical History   Diagnosis Date   ??? 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 Factor V Leiden mutation    ??? Chronic kidney disease      kidney stones   ??? Neurological disorder      migraines   ??? Psychiatric disorder      depression   ??? Infectious disease 02/2009      Hx MRSA / E.Coli bacteremia, MRSA wound infection   ??? Thromboembolus      Factor 5        Past Surgical History   Procedure Laterality Date   ??? Cystoscopy  ???   ??? Lithotripsy  2006   ??? Hx vascular access  July, 2010     Power Port placed in [place], [place] removed   ??? Hx tonsillectomy     ??? Hx urological       cystoscopex13   ??? Hx appendectomy  ???   ??? Hx cesarean section     ??? Hx tubal ligation     ??? Pr vascular surgery procedure unlist       Port/removal         Family History   Problem Relation Age of Onset   ??? Cancer Father      died from colon ca.   ??? Cancer Maternal Grandmother      metastatic breast CA.        History     Social History   ??? Marital Status: DIVORCED     Spouse Name: N/A     Number of Children: N/A   ??? Years of Education: N/A     Occupational History   ??? Not on file.     Social History Main Topics   ??? Smoking status: Never Smoker    ??? Smokeless tobacco: Never Used   ??? Alcohol Use: Yes   ??? Drug Use: No      Comment: narcotic seeking per hospital hx.   ??? Sexually Active: Yes -- Female partner(s)     Birth Control/ Protection: Surgical     Other Topics Concern   ??? Not on file     Social History Narrative   ??? No narrative on file                  ALLERGIES: Ativan; Codeine; Influenza virus vaccines; Iodinated contrast media - iv dye;  Ketorolac tromethamine; Morphine; Motrin; Pcn; Pneumococcal vaccine; Pneumovax 23; Prednisone; and Shellfish containing products      Review of Systems   Constitutional: Positive for malaise/fatigue. Negative for chills.       Filed Vitals:    01/11/12 1255   BP: 125/90   Pulse: 104   Temp: 98.6 ??F (37 ??C)   Resp: 16   Height: 5\' 4"  (1.626 m)   Weight: 60.782 kg (134 lb)   SpO2: 100%            Physical Exam   Constitutional: She is oriented to person, place, and time. She appears well-developed and well-nourished. No distress.   HENT:  Head: Atraumatic.   Eyes: Right eye exhibits no discharge.   Cardiovascular: Normal rate.    No murmur heard.  Pulmonary/Chest: No respiratory distress.   Abdominal: She exhibits no distension. There is no tenderness. There is no rebound and no guarding.   Musculoskeletal: She exhibits no edema.   Neurological: She is oriented to person, place, and time. She displays normal reflexes. No cranial nerve deficit. Coordination normal.   Skin: Rash noted. There is erythema. There is pallor.   Psychiatric: She has a normal mood and affect. Her behavior is normal.        MDM     Differential Diagnosis; Clinical Impression; Plan:     Patient recurrent visists to the emergency depart frequent debilitaing headaches , patient appears to be in acute pain fropm a headache, did not get her imitrex fiiled  Progress:   Patient progress:  Stable      Procedures

## 2012-01-11 NOTE — ED Notes (Signed)
Patient presents with c/o migraine with n/v. States she hit her head 2 weeks ago and bruise is still present to right forehead.

## 2012-01-11 NOTE — ED Notes (Signed)
I have reviewed discharge instructions with the patient.  The patient verbalized understanding.

## 2012-01-14 MED ADMIN — meperidine (DEMEROL) injection 50 mg: INTRAMUSCULAR | @ 20:00:00 | NDC 00409117630

## 2012-01-14 MED ADMIN — meperidine (DEMEROL) injection 25 mg: INTRAMUSCULAR | @ 23:00:00 | NDC 00641605201

## 2012-01-14 MED FILL — DEMEROL (PF) 25 MG/ML INJECTION SYRINGE: 25 mg/mL | INTRAMUSCULAR | Qty: 1

## 2012-01-14 MED FILL — DEMEROL (PF) 25 MG/ML INJECTION SYRINGE: 25 mg/mL | INTRAMUSCULAR | Qty: 2

## 2012-01-14 NOTE — ED Notes (Signed)
Pt sts pain improved. NAD , resting with eyes closed, resp equal and unlabored.

## 2012-01-14 NOTE — ED Notes (Signed)
Seen here on Monday states has not been able to get appt with md yet states has thrownup two imitrex

## 2012-01-14 NOTE — ED Notes (Signed)
Med administered as documented, pt tolerated well.

## 2012-01-14 NOTE — ED Provider Notes (Signed)
Patient is a 46 y.o. female presenting with headaches. [name]   Headache   This is a recurrent problem. The current episode started more than 2 days ago. The problem occurs constantly. The headache is aggravated by photophobia. The pain is located in the bilateral region. The pain is at a severity of 10/10. Associated symptoms include nausea and vomiting. Pertinent negatives include no fever.        Past Medical History   Diagnosis Date   ??? 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 Factor V Leiden mutation    ??? Chronic kidney disease      kidney stones   ??? Neurological disorder      migraines   ??? Psychiatric disorder      depression   ??? Infectious disease 02/2009      Hx MRSA / E.Coli bacteremia, MRSA wound infection   ??? Thromboembolus      Factor 5        Past Surgical History   Procedure Laterality Date   ??? Cystoscopy  ???   ??? Lithotripsy  2006   ??? Hx vascular access  July, 2010     Power Port placed in [place], [place] removed   ??? Hx tonsillectomy     ??? Hx urological       cystoscopex13   ??? Hx appendectomy  ???   ??? Hx cesarean section     ??? Hx tubal ligation     ??? Pr vascular surgery procedure unlist       Port/removal         Family History   Problem Relation Age of Onset   ??? Cancer Father      died from colon ca.   ??? Cancer Maternal Grandmother      metastatic breast CA.        History     Social History   ??? Marital Status: DIVORCED     Spouse Name: N/A     Number of Children: N/A   ??? Years of Education: N/A     Occupational History   ??? Not on file.     Social History Main Topics   ??? Smoking status: Never Smoker    ??? Smokeless tobacco: Never Used   ??? Alcohol Use: Yes   ??? Drug Use: No      Comment: narcotic seeking per hospital hx.   ??? Sexually Active: Yes -- Female partner(s)     Birth Control/ Protection: Surgical     Other Topics Concern   ??? Not on file     Social History Narrative   ??? No narrative on file                  ALLERGIES: Ativan; Codeine; Influenza  virus vaccines; Iodinated contrast media - iv dye; Ketorolac tromethamine; Morphine; Motrin; Pcn; Pneumococcal vaccine; Pneumovax 23; Prednisone; and Shellfish containing products      Review of Systems   Constitutional: Negative for fever.   Gastrointestinal: Positive for nausea and vomiting.   Neurological: Positive for headaches.   All other systems reviewed and are negative.        Filed Vitals:    01/14/12 1412   BP: 124/80   Pulse: 112   Temp: 98 ??F (36.7 ??C)   Resp: 18   Weight: 62.596 kg (138 lb)   SpO2: 99%  Physical Exam   Nursing note and vitals reviewed.  Constitutional: She is oriented to person, place, and time. She appears well-developed and well-nourished.   HENT:   Head: Normocephalic and atraumatic.   Right Ear: External ear normal.   Left Ear: External ear normal.   Eyes: Conjunctivae and EOM are normal.   Neck: Normal range of motion. Neck supple.   Cardiovascular: Normal rate, regular rhythm and normal heart sounds.    Pulmonary/Chest: Effort normal and breath sounds normal.   Abdominal: Soft. Bowel sounds are normal.   Musculoskeletal: Normal range of motion.   Neurological: She is alert and oriented to person, place, and time.   Skin: Skin is warm and dry.   Psychiatric: She has a normal mood and affect.        MDM    Procedures

## 2012-01-14 NOTE — ED Notes (Signed)
Visitor at bedside sts to transport pt home, Discharge instr.Given and reviewed with pt, questions answered and pt verbalized understanding. Ambulaotry out with out dif.

## 2012-01-14 NOTE — ED Notes (Signed)
C/o left frontal headache onset Monday 0300 with N/V, sts was treated Monday for same and felt some relief but sx returned, sts has taken Imitrex and Zofran with out relief.

## 2012-01-23 LAB — CBC WITH AUTOMATED DIFF
ABS. BASOPHILS: 0 10*3/uL (ref 0.0–0.2)
ABS. EOSINOPHILS: 0.2 10*3/uL (ref 0.0–0.8)
ABS. IMM. GRANS.: 0 10*3/uL (ref 0.0–0.5)
ABS. LYMPHOCYTES: 2 10*3/uL (ref 0.5–4.6)
ABS. MONOCYTES: 0.3 10*3/uL (ref 0.1–1.3)
ABS. NEUTROPHILS: 2.3 10*3/uL (ref 1.7–8.2)
BASOPHILS: 0 % (ref 0.0–2.0)
EOSINOPHILS: 4 % (ref 0.5–7.8)
HCT: 36 % (ref 35.8–46.3)
HGB: 11.9 g/dL (ref 11.7–15.4)
IMMATURE GRANULOCYTES: 0.2 % (ref 0.0–5.0)
LYMPHOCYTES: 41 % (ref 13–44)
MCH: 30.4 PG (ref 26.1–32.9)
MCHC: 33.1 g/dL (ref 31.4–35.0)
MCV: 91.8 FL (ref 79.6–97.8)
MONOCYTES: 7 % (ref 4.0–12.0)
MPV: 10.2 FL — ABNORMAL LOW (ref 10.8–14.1)
NEUTROPHILS: 48 % (ref 43–78)
PLATELET: 212 10*3/uL (ref 150–450)
RBC: 3.92 M/uL — ABNORMAL LOW (ref 4.05–5.25)
RDW: 14.6 % (ref 11.9–14.6)
WBC: 4.7 10*3/uL (ref 4.3–11.1)

## 2012-01-23 LAB — METABOLIC PANEL, COMPREHENSIVE
A-G Ratio: 1.2 (ref 1.2–3.5)
ALT (SGPT): 17 U/L (ref 12–65)
AST (SGOT): 9 U/L — ABNORMAL LOW (ref 15–37)
Albumin: 3.8 g/dL (ref 3.5–5.0)
Alk. phosphatase: 111 U/L (ref 50–136)
Anion gap: 7 mmol/L (ref 7–16)
BUN: 13 MG/DL (ref 6–23)
Bilirubin, total: 0.2 MG/DL (ref 0.2–1.1)
CO2: 25 MMOL/L (ref 21–32)
Calcium: 9.1 MG/DL (ref 8.3–10.4)
Chloride: 109 MMOL/L — ABNORMAL HIGH (ref 98–107)
Creatinine: 0.79 MG/DL (ref 0.6–1.0)
GFR est AA: >60 mL/min/{1.73_m2} (ref >60)
GFR est non-AA: >60 mL/min/{1.73_m2} (ref >60)
Globulin: 3.3 g/dL (ref 2.3–3.5)
Glucose: 118 MG/DL — ABNORMAL HIGH (ref 65–100)
Potassium: 3.9 MMOL/L (ref 3.5–5.1)
Protein, total: 7.1 g/dL (ref 6.3–8.2)
Sodium: 141 MMOL/L (ref 136–145)

## 2012-01-23 LAB — POC TROPONIN: Troponin-I (POC): 0 ng/ml (ref 0.0–0.08)

## 2012-01-23 NOTE — ED Notes (Signed)
Pt reports that she had sudden onset of pain to L chest that began at 1600 today, she is still having pain which increases upon deep inspiration.  Pt was seen by Dr. Anner Crete at [facility] today, R CW port still accessed at this time.

## 2012-01-23 NOTE — ED Notes (Signed)
Pt states she was dismissed from her family md and saw Dr. Anner Crete and placed back on coumadin and lovenox yesterday

## 2012-01-23 NOTE — ED Notes (Signed)
Report provided to Desma Paganini, RN, 2nd trop currently running and pt aware of negative results.

## 2012-01-23 NOTE — ED Notes (Signed)
Pt has been provided w/ pain medication per MD orders, confirmed that [facility] tech has been called in per [facility] in [place], will cont to monitor.

## 2012-01-23 NOTE — ED Provider Notes (Signed)
HPI Comments: Presents with c/o L sided CP that she says feels like a PE.  States she has had 15 PEs and was taken off of coumadin after a fall.  Pt     Patient is a 46 y.o. female presenting with chest pain. [name]   Chest Pain (Angina)   This is a new problem. The current episode started yesterday. The problem has not changed since onset.The problem occurs constantly. The pain is present in the left side. The pain is moderate. The quality of the pain is described as sharp. The pain does not radiate. The symptoms are aggravated by deep breathing. Associated symptoms include shortness of breath. Pertinent negatives include no leg pain, no lower extremity edema, no nausea and no vomiting. She has tried nothing for the symptoms. Her past medical history is significant for PE.       Past Medical History   Diagnosis Date   ??? 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 Factor V Leiden mutation    ??? Chronic kidney disease      kidney stones   ??? Neurological disorder      migraines   ??? Psychiatric disorder      depression   ??? Infectious disease 02/2009      Hx MRSA / E.Coli bacteremia, MRSA wound infection   ??? Thromboembolus      Factor 5        Past Surgical History   Procedure Laterality Date   ??? Cystoscopy  ???   ??? Lithotripsy  2006   ??? Hx vascular access  July, 2010     Power Port placed in [place], [place]., removed   ??? Hx tonsillectomy     ??? Hx urological       cystoscopex13   ??? Hx appendectomy  ???   ??? Hx cesarean section     ??? Hx tubal ligation     ??? Pr vascular surgery procedure unlist       Port/removal         Family History   Problem Relation Age of Onset   ??? Cancer Father      died from colon ca.   ??? Cancer Maternal Grandmother      metastatic breast CA.        History     Social History   ??? Marital Status: DIVORCED     Spouse Name: N/A     Number of Children: N/A   ??? Years of Education: N/A     Occupational History   ??? Not on file.     Social History Main Topics   ???  Smoking status: Never Smoker    ??? Smokeless tobacco: Never Used   ??? Alcohol Use: Yes   ??? Drug Use: No      Comment: narcotic seeking per hospital hx.   ??? Sexually Active: Yes -- Female partner(s)     Birth Control/ Protection: Surgical     Other Topics Concern   ??? Not on file     Social History Narrative   ??? No narrative on file                  ALLERGIES: Ativan; Codeine; Influenza virus vaccines; Iodinated contrast media - iv dye; Ketorolac tromethamine; Morphine; Motrin; Pcn; Pneumococcal vaccine; Pneumovax 23; Prednisone; and Shellfish containing products      Review of Systems   Respiratory: Positive for  shortness of breath.    Cardiovascular: Positive for chest pain.   Gastrointestinal: Negative for nausea and vomiting.   All other systems reviewed and are negative.        Filed Vitals:    01/23/12 1644 01/23/12 1926 01/23/12 1929 01/23/12 1940   BP: 147/92 129/59 135/57    Pulse: 103 99 91 94   Temp: 98.1 ??F (36.7 ??C)      Resp: 20 25 17 28    Height: 5\' 4"  (1.626 m)      Weight: 63.957 kg (141 lb)      SpO2: 95% 96% 96% 97%            Physical Exam   Nursing note and vitals reviewed.  Constitutional: She is oriented to person, place, and time. She appears well-developed and well-nourished. No distress.   HENT:   Head: Normocephalic and atraumatic.   Neck: Neck supple.   Cardiovascular: Normal rate and regular rhythm.    Pulmonary/Chest: Effort normal and breath sounds normal.   Abdominal: Soft. There is no tenderness. There is no rebound and no guarding.   Musculoskeletal: Normal range of motion. She exhibits no edema and no tenderness.   Neurological: She is alert and oriented to person, place, and time. No cranial nerve deficit.   Skin: Skin is warm and dry. She is not diaphoretic.        MDM     Amount and/or Complexity of Data Reviewed:   Clinical lab tests:  Ordered and reviewed   Review and summarize past medical records:  Yes   Independant visualization of image, tracing, or specimen:  Yes  Risk of  Significant Complications, Morbidity, and/or Mortality:   Presenting problems:  High  Diagnostic procedures:  High  Management options:  High  Progress:   Patient progress:  Stable and improved      Procedures

## 2012-01-23 NOTE — ED Notes (Signed)
Pt reports that her pain is much improved at 6/10, she is currently being transported to [facility] at this time.

## 2012-01-23 NOTE — ED Notes (Signed)
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 Wells yesterday and started on lovenox

## 2012-01-24 LAB — D-DIMER, QUANTITATIVE: D-Dimer, Quant: 0.59 ug/ml(FEU) (ref <0.55)

## 2012-01-24 LAB — EKG, 12 LEAD, INITIAL
Atrial Rate: 103 {beats}/min
Calculated P Axis: 72 degrees
Calculated R Axis: 87 degrees
Calculated T Axis: 64 degrees
P-R Interval: 152 ms
Q-T Interval: 332 ms
QRS Duration: 88 ms
QTC Calculation (Bezet): 434 ms
Ventricular Rate: 103 {beats}/min

## 2012-01-24 LAB — POC TROPONIN: Troponin-I (POC): 0 ng/ml (ref 0.0–0.08)

## 2012-01-24 LAB — D DIMER: D DIMER: 0.59 ug/ml(FEU) — CR (ref <0.55)

## 2012-01-24 MED ADMIN — HYDROmorphone (PF) (DILAUDID) injection 1 mg: INTRAVENOUS | @ 02:00:00 | NDC 00409128331

## 2012-01-24 MED ADMIN — heparin (porcine) pf 300 Units: @ 05:00:00

## 2012-01-24 MED FILL — HYDROMORPHONE (PF) 1 MG/ML IJ SOLN: 1 mg/mL | INTRAMUSCULAR | Qty: 1

## 2012-01-24 MED FILL — HEPARIN LOCK FLUSH (PORCINE) (PF) 100 UNIT/ML INTRAVENOUS SYRINGE: 100 unit/mL | INTRAVENOUS | Qty: 3

## 2012-01-24 NOTE — ED Notes (Signed)
Pt given written and verbal d/c instructions. Pt verbalized understanding. Pt left ed ambulatory without incident. No needs/concerns voiced prior to d/c

## 2012-02-21 LAB — METABOLIC PANEL, COMPREHENSIVE
A-G Ratio: 1.1 — ABNORMAL LOW (ref 1.2–3.5)
ALT (SGPT): 34 U/L (ref 12–65)
AST (SGOT): 21 U/L (ref 15–37)
Albumin: 3.8 g/dL (ref 3.5–5.0)
Alk. phosphatase: 119 U/L (ref 50–136)
Anion gap: 9 mmol/L (ref 7–16)
BUN: 16 MG/DL (ref 6–23)
Bilirubin, total: 0.2 MG/DL (ref 0.2–1.1)
CO2: 24 MMOL/L (ref 21–32)
Calcium: 9.6 MG/DL (ref 8.3–10.4)
Chloride: 107 MMOL/L (ref 98–107)
Creatinine: 0.65 MG/DL (ref 0.6–1.0)
GFR est AA: >60 mL/min/{1.73_m2} (ref >60)
GFR est non-AA: >60 mL/min/{1.73_m2} (ref >60)
Globulin: 3.4 g/dL (ref 2.3–3.5)
Glucose: 100 MG/DL (ref 65–100)
Potassium: 4 MMOL/L (ref 3.5–5.1)
Protein, total: 7.2 g/dL (ref 6.3–8.2)
Sodium: 140 MMOL/L (ref 136–145)

## 2012-02-21 LAB — CBC WITH AUTOMATED DIFF
ABS. BASOPHILS: 0 10*3/uL (ref 0.0–0.2)
ABS. EOSINOPHILS: 0.4 10*3/uL (ref 0.0–0.8)
ABS. IMM. GRANS.: 0 10*3/uL (ref 0.0–0.5)
ABS. LYMPHOCYTES: 1.5 10*3/uL (ref 0.5–4.6)
ABS. MONOCYTES: 0.5 10*3/uL (ref 0.1–1.3)
ABS. NEUTROPHILS: 1.6 10*3/uL — ABNORMAL LOW (ref 1.7–8.2)
BASOPHILS: 1 % (ref 0.0–2.0)
EOSINOPHILS: 10 % — ABNORMAL HIGH (ref 0.5–7.8)
HCT: 34.3 % — ABNORMAL LOW (ref 35.8–46.3)
HGB: 11.4 g/dL — ABNORMAL LOW (ref 11.7–15.4)
IMMATURE GRANULOCYTES: 0.3 % (ref 0.0–5.0)
LYMPHOCYTES: 36 % (ref 13–44)
MCH: 30.9 PG (ref 26.1–32.9)
MCHC: 33.2 g/dL (ref 31.4–35.0)
MCV: 93 FL (ref 79.6–97.8)
MONOCYTES: 12 % (ref 4.0–12.0)
MPV: 10 FL — ABNORMAL LOW (ref 10.8–14.1)
NEUTROPHILS: 41 % — ABNORMAL LOW (ref 43–78)
PLATELET: 271 10*3/uL (ref 150–450)
RBC: 3.69 M/uL — ABNORMAL LOW (ref 4.05–5.25)
RDW: 14.8 % — ABNORMAL HIGH (ref 11.9–14.6)
WBC: 4 10*3/uL — ABNORMAL LOW (ref 4.3–11.1)

## 2012-02-21 LAB — PROTHROMBIN TIME + INR
INR: 1.9 — ABNORMAL HIGH (ref 0.9–1.2)
Prothrombin time: 20.7 s — ABNORMAL HIGH (ref 9.6–11.6)

## 2012-02-21 LAB — MAGNESIUM: Magnesium: 1.9 MG/DL (ref 1.8–2.4)

## 2012-02-21 LAB — LIPASE: Lipase: 258 U/L (ref 73–393)

## 2012-02-21 MED ADMIN — sodium chloride 0.9 % bolus infusion 1,000 mL: INTRAVENOUS | @ 19:00:00 | NDC 00409798309

## 2012-02-21 MED ADMIN — HYDROmorphone (PF) (DILAUDID) injection 1 mg: INTRAVENOUS | @ 19:00:00 | NDC 00409128331

## 2012-02-21 MED ADMIN — ondansetron (ZOFRAN) injection 4 mg: INTRAVENOUS | @ 19:00:00 | NDC 00409475503

## 2012-02-21 MED ADMIN — heparin (porcine) pf 100 unit/mL: @ 21:00:00

## 2012-02-21 MED FILL — ONDANSETRON (PF) 4 MG/2 ML INJECTION: 4 mg/2 mL | INTRAMUSCULAR | Qty: 2

## 2012-02-21 MED FILL — HEPARIN LOCK FLUSH (PORCINE) (PF) 100 UNIT/ML INTRAVENOUS SYRINGE: 100 unit/mL | INTRAVENOUS | Qty: 3

## 2012-02-21 MED FILL — HYDROMORPHONE (PF) 1 MG/ML IJ SOLN: 1 mg/mL | INTRAMUSCULAR | Qty: 1

## 2012-02-21 NOTE — ED Notes (Signed)
Port flushed with 300 units of heparin per hospital protocol.

## 2012-02-21 NOTE — ED Notes (Signed)
I have reviewed discharge instructions with the patient. The patient verbalized understanding. Copy of discharge information given to patient upon discharge. Patient discharged ambulatory in no distress.

## 2012-02-21 NOTE — ED Provider Notes (Signed)
HPI Comments: Pt stated she had been fine for last several days. This am about 0600 she wakened with ruq pain with radiation around to back on right. Some n/v. No D/C or blood in stool. No uti sx. Pt had waxed and waned initially and then stayed constant.     Patient is a 47 y.o. female presenting with abdominal pain. The history is provided by the patient and medical records.   Abdominal Pain   This is a new problem. Episode onset: 0600 this am. The problem occurs constantly. The problem has not changed since onset.Associated with: possible gall bladder disease. The pain is located in the RUQ. The quality of the pain is aching. The pain is at a severity of 8/10. Associated symptoms include nausea and vomiting. Pertinent negatives include no fever, no diarrhea, no hematochezia, no melena, no constipation, no dysuria and no frequency. The pain is worsened by activity. The pain is relieved by nothing. Past workup comments: hida several years ago that was neg. Her past medical history is significant for kidney stones. Her past medical history does not include gallstones, irritable bowel syndrome, pancreatitis, diverticulitis or small bowel obstruction. Past medical history comments: recurrent pe. The patient's surgical history includes appendectomy.c section       Past Medical History   Diagnosis Date   ??? 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 Factor V Leiden mutation    ??? Chronic kidney disease      kidney stones   ??? Neurological disorder      migraines   ??? Psychiatric disorder      depression   ??? Infectious disease 02/2009      Hx MRSA / E.Coli bacteremia, MRSA wound infection   ??? Thromboembolus      Factor 5        Past Surgical History   Procedure Laterality Date   ??? Cystoscopy  ???   ??? Lithotripsy  2006   ??? Hx vascular access  July, 2010     Power Port placed in [place], [place] removed   ??? Hx tonsillectomy     ??? Hx urological       cystoscopex13   ??? Hx appendectomy  ???   ??? Hx cesarean section      ??? Hx tubal ligation     ??? Pr vascular surgery procedure unlist       Port/removal         Family History   Problem Relation Age of Onset   ??? Cancer Father      died from colon ca.   ??? Cancer Maternal Grandmother      metastatic breast CA.        History     Social History   ??? Marital Status: DIVORCED     Spouse Name: N/A     Number of Children: N/A   ??? Years of Education: N/A     Occupational History   ??? Not on file.     Social History Main Topics   ??? Smoking status: Never Smoker    ??? Smokeless tobacco: Never Used   ??? Alcohol Use: Yes   ??? Drug Use: No      Comment: narcotic seeking per hospital hx.   ??? Sexually Active: Yes -- Female partner(s)     Birth Control/ Protection: Surgical     Other Topics Concern   ??? Not on file     Social History Narrative   ???  No narrative on file                  ALLERGIES: Ativan; Codeine; Influenza virus vaccines; Iodinated contrast media - iv dye; Ketorolac tromethamine; Morphine; Motrin; Pcn; Pneumococcal vaccine; Pneumovax 23; Prednisone; and Shellfish containing products      Review of Systems   Constitutional: Positive for activity change and appetite change. Negative for fever, chills and fatigue.   Gastrointestinal: Positive for nausea, vomiting and abdominal pain. Negative for diarrhea, constipation, melena and hematochezia.   Genitourinary: Negative.  Negative for dysuria and frequency.   All other systems reviewed and are negative.        Filed Vitals:    02/21/12 1218   BP: 114/65   Pulse: 85   Temp: 98.3 ??F (36.8 ??C)   Resp: 15   Height: 5\' 4"  (1.626 m)   Weight: 62.143 kg (137 lb)   SpO2: 96%            Physical Exam   Nursing note and vitals reviewed.  Constitutional: She is oriented to person, place, and time. She appears well-developed and well-nourished. No distress.   HENT:   Head: Normocephalic and atraumatic.   Right Ear: External ear normal.   Left Ear: External ear normal.   Mouth/Throat: Oropharynx is clear and moist.   Eyes: EOM are normal. Pupils are equal,  round, and reactive to light.   Neck: Normal range of motion. Neck supple.   Cardiovascular: Normal rate and regular rhythm.    Pulmonary/Chest: Effort normal and breath sounds normal. She has no wheezes. She has no rales.   Abdominal: Soft. Bowel sounds are normal. There is tenderness.   Some tenderness ruq    Musculoskeletal: Normal range of motion. She exhibits no edema and no tenderness.   Lymphadenopathy:     She has no cervical adenopathy.   Neurological: She is alert and oriented to person, place, and time.   Skin: Skin is warm and dry.   Psychiatric: She has a normal mood and affect.        MDM    Procedures

## 2012-02-21 NOTE — ED Notes (Signed)
[facility] contacted for pt transportation.  Pt aware of 30 min - 3 hour wait time.  Pt waiting in lobby.

## 2012-02-21 NOTE — ED Notes (Signed)
Intermittent/ RUQ pain/ states she has gallbladder problems

## 2012-02-21 NOTE — ED Notes (Signed)
Port accessed per pt's request.

## 2012-02-21 NOTE — ED Notes (Signed)
Pt has port/ labs not drawn in triage/ primary RN aware

## 2012-02-21 NOTE — ED Notes (Signed)
Advised pt of results of lab and US/ will discharge her to see fmd.

## 2012-02-22 NOTE — ED Notes (Signed)
Message relayed to dr circle pt request for narcotic for pain control

## 2012-02-22 NOTE — ED Provider Notes (Addendum)
Patient is a 47 y.o. female presenting with headaches. [name]   Headache   This is a recurrent problem. The current episode started 12 to 24 hours ago. The problem occurs constantly. The problem has not changed since onset.The headache is aggravated by vomiting and photophobia. The pain is located in the generalized region. The quality of the pain is described as throbbing. The pain is at a severity of 10/10. The pain is severe. Associated symptoms include nausea and vomiting. Pertinent negatives include no dizziness. She has tried triptan therapy for the symptoms. The treatment provided no relief.        Past Medical History   Diagnosis Date   ??? 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 Factor V Leiden mutation    ??? Chronic kidney disease      kidney stones   ??? Neurological disorder      migraines   ??? Psychiatric disorder      depression   ??? Infectious disease 02/2009      Hx MRSA / E.Coli bacteremia, MRSA wound infection   ??? Thromboembolus      Factor 5        Past Surgical History   Procedure Laterality Date   ??? Cystoscopy  ???   ??? Lithotripsy  2006   ??? Hx vascular access  July, 2010     Power Port placed in [place], [place] removed   ??? Hx tonsillectomy     ??? Hx urological       cystoscopex13   ??? Hx appendectomy  ???   ??? Hx cesarean section     ??? Hx tubal ligation     ??? Pr vascular surgery procedure unlist       Port/removal         Family History   Problem Relation Age of Onset   ??? Cancer Father      died from colon ca.   ??? Cancer Maternal Grandmother      metastatic breast CA.        History     Social History   ??? Marital Status: DIVORCED     Spouse Name: N/A     Number of Children: N/A   ??? Years of Education: N/A     Occupational History   ??? Not on file.     Social History Main Topics   ??? Smoking status: Never Smoker    ??? Smokeless tobacco: Never Used   ??? Alcohol Use: Yes      Comment: rarely   ??? Drug Use: No      Comment: narcotic seeking per hospital hx.   ???  Sexually Active: Yes -- Female partner(s)     Birth Control/ Protection: Surgical     Other Topics Concern   ??? Not on file     Social History Narrative   ??? No narrative on file                  ALLERGIES: Ativan; Codeine; Influenza virus vaccines; Iodinated contrast media - iv dye; Ketorolac tromethamine; Morphine; Motrin; Pcn; Pneumococcal vaccine; Pneumovax 23; Prednisone; and Shellfish containing products      Review of Systems   Gastrointestinal: Positive for nausea and vomiting.   Neurological: Positive for headaches. Negative for dizziness.       Filed Vitals:    02/22/12 2025   BP: 119/81   Pulse: 78   Temp:  99.2 ??F (37.3 ??C)   Resp: 16   Height: 5\' 4"  (1.626 m)   Weight: 62.596 kg (138 lb)   SpO2: 97%            Physical Exam   Nursing note and vitals reviewed.  Constitutional: She is oriented to person, place, and time. She appears well-developed and well-nourished.   HENT:   Head: Normocephalic and atraumatic.   Eyes: Conjunctivae are normal. Pupils are equal, round, and reactive to light.   Neck: Normal range of motion. Neck supple.   Cardiovascular: Normal rate and regular rhythm.    Pulmonary/Chest: Effort normal and breath sounds normal.   Abdominal: Soft. Bowel sounds are normal.   Musculoskeletal: She exhibits no edema and no tenderness.   Neurological: She is alert and oriented to person, place, and time.   Skin: Skin is warm and dry.   Psychiatric: She has a normal mood and affect. Her behavior is normal.        MDM     Differential Diagnosis; Clinical Impression; Plan:     No sig improvement in pain after initial meds, will try inapsine. Cannot give [id number] as she has had imitrex today      Procedures

## 2012-02-22 NOTE — ED Notes (Signed)
Pt has right chest port accessed with 20 gauge huber needle. Labs drawn and NS infusing well.

## 2012-02-22 NOTE — ED Notes (Signed)
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

## 2012-02-22 NOTE — ED Notes (Signed)
Ambulated to br no problem

## 2012-02-22 NOTE — ED Notes (Signed)
C/o "stomach hurting still, [place] [facility] and thinks her stomach is "getting bigger"  States unable to keep oral zofran in mouth, made her vomit

## 2012-02-23 LAB — METABOLIC PANEL, BASIC
Anion gap: 7 mmol/L (ref 7–16)
BUN: 11 MG/DL (ref 6–23)
CO2: 28 MMOL/L (ref 21–32)
Calcium: 8.7 MG/DL (ref 8.3–10.4)
Chloride: 104 MMOL/L (ref 98–107)
Creatinine: 0.9 MG/DL (ref 0.6–1.0)
GFR est AA: >60 mL/min/{1.73_m2} (ref >60)
GFR est non-AA: >60 mL/min/{1.73_m2} (ref >60)
Glucose: 98 MG/DL (ref 65–100)
Potassium: 3.8 MMOL/L (ref 3.5–5.1)
Sodium: 139 MMOL/L (ref 136–145)

## 2012-02-23 LAB — PROTHROMBIN TIME + INR
INR: 3 — ABNORMAL HIGH (ref 0.9–1.2)
Prothrombin time: 32.8 s — ABNORMAL HIGH (ref 9.6–11.6)

## 2012-02-23 LAB — CBC W/O DIFF
HCT: 32.1 % — ABNORMAL LOW (ref 35.8–46.3)
HGB: 10.6 g/dL — ABNORMAL LOW (ref 11.7–15.4)
MCH: 30.9 PG (ref 26.1–32.9)
MCHC: 33 g/dL (ref 31.4–35.0)
MCV: 93.6 FL (ref 79.6–97.8)
MPV: 9.7 FL — ABNORMAL LOW (ref 10.8–14.1)
PLATELET: 270 10*3/uL (ref 150–450)
RBC: 3.43 M/uL — ABNORMAL LOW (ref 4.05–5.25)
RDW: 14.7 % — ABNORMAL HIGH (ref 11.9–14.6)
WBC: 4.5 10*3/uL (ref 4.3–11.1)

## 2012-02-23 LAB — HCG URINE, QL. - POC: Pregnancy test,urine (POC): NEGATIVE

## 2012-02-23 MED ADMIN — heparin (porcine) pf 100 unit/mL: @ 07:00:00

## 2012-02-23 MED ADMIN — droperidol (INAPSINE) injection 1.25 mg: INTRAVENOUS | @ 05:00:00 | NDC 00517970225

## 2012-02-23 MED ADMIN — sodium chloride 0.9 % bolus infusion 1,000 mL: INTRAVENOUS | @ 03:00:00 | NDC 00409798309

## 2012-02-23 MED ADMIN — diphenhydrAMINE (BENADRYL) injection 25 mg: INTRAVENOUS | @ 05:00:00 | NDC 63323066401

## 2012-02-23 MED ADMIN — promethazine (PHENERGAN) injection 25 mg: INTRAVENOUS | @ 02:00:00 | NDC 00641092821

## 2012-02-23 MED ADMIN — diphenhydrAMINE (BENADRYL) injection 25 mg: INTRAVENOUS | @ 02:00:00 | NDC 63323066401

## 2012-02-23 MED ADMIN — dexamethasone (DECADRON) injection 10 mg: INTRAVENOUS | @ 02:00:00 | NDC 00641036721

## 2012-02-23 MED FILL — INAPSINE 2.5 MG/ML INJECTION SOLUTION: 2.5 mg/mL | INTRAMUSCULAR | Qty: 2

## 2012-02-23 MED FILL — PROMETHAZINE 25 MG/ML INJECTION: 25 mg/mL | INTRAMUSCULAR | Qty: 1

## 2012-02-23 MED FILL — DROPERIDOL 2.5 MG/ML IJ SOLN: 2.5 mg/mL | INTRAMUSCULAR | Qty: 0.5

## 2012-02-23 MED FILL — DEXAMETHASONE SODIUM PHOSPHATE 10 MG/ML IJ SOLN: 10 mg/mL | INTRAMUSCULAR | Qty: 1

## 2012-02-23 MED FILL — HEPARIN LOCK FLUSH (PORCINE) (PF) 100 UNIT/ML INTRAVENOUS SYRINGE: 100 unit/mL | INTRAVENOUS | Qty: 3

## 2012-02-23 MED FILL — DIPHENHYDRAMINE HCL 50 MG/ML IJ SOLN: 50 mg/mL | INTRAMUSCULAR | Qty: 1

## 2012-02-23 NOTE — ED Notes (Signed)
Pt awakened from sleep, port flushed w/ 8ml's NS and packed w/ 3 mls of U100 Heparin. 20g huber needle INT d/c'd intact, site clear.  Guaze dressing   Pt verbalized understanding of discharge instructions   Pt given her clothing , advised to dress.  Pt fell back asleep after requesting "medicaid van to be called for ride home"

## 2012-02-23 NOTE — ED Notes (Signed)
0145 - sleeping in room in no distress

## 2012-02-23 NOTE — ED Notes (Signed)
[facility] called for transport home by Wynema Birch RN

## 2012-02-23 NOTE — ED Notes (Signed)
Pt waiting in lobby for ride.  Reservation number [phone]

## 2012-02-23 NOTE — ED Notes (Signed)
Pt dressed and ambulatory to lobby to wait for ride

## 2012-02-23 NOTE — ED Notes (Signed)
Pt sleeping soundly, snoring.

## 2012-03-10 MED ADMIN — clindamycin (CLEOCIN) capsule 300 mg: ORAL | @ 22:00:00 | NDC 68084024311

## 2012-03-10 MED FILL — CLINDAMYCIN 150 MG CAP: 150 mg | ORAL | Qty: 2

## 2012-03-10 NOTE — ED Notes (Signed)
Top left canine tooth decayed.

## 2012-03-10 NOTE — ED Provider Notes (Signed)
Patient is a 47 y.o. female presenting with dental problem. [name]   Dental Pain   This is a new problem. The problem occurs constantly. The problem has not changed since onset.The pain is located in the left upper mouth.The quality of the pain is aching.  There was no vomiting and no nausea. Treatments tried: ultram not helpful. The treatment provided no relief. The patient has no cardiac history.       Past Medical History   Diagnosis Date   ??? 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 Factor V Leiden mutation    ??? Chronic kidney disease      kidney stones   ??? Neurological disorder      migraines   ??? Psychiatric disorder      depression   ??? Infectious disease 02/2009      Hx MRSA / E.Coli bacteremia, MRSA wound infection   ??? Thromboembolus      Factor 5, Factor 2        Past Surgical History   Procedure Laterality Date   ??? Cystoscopy  ???   ??? Lithotripsy  2006   ??? Hx tonsillectomy     ??? Hx urological       cystoscopex13   ??? Hx appendectomy  ???   ??? Hx cesarean section     ??? Hx tubal ligation     ??? Pr vascular surgery procedure unlist       Port/removal   ??? Hx vascular access  July, 2010     Power Port placed in [place], [place] removed   ??? Hx vascular access       pt has current rt subclavian port         Family History   Problem Relation Age of Onset   ??? Cancer Father      died from colon ca.   ??? Cancer Maternal Grandmother      metastatic breast CA.        History     Social History   ??? Marital Status: DIVORCED     Spouse Name: N/A     Number of Children: N/A   ??? Years of Education: N/A     Occupational History   ??? Not on file.     Social History Main Topics   ??? Smoking status: Never Smoker    ??? Smokeless tobacco: Never Used   ??? Alcohol Use: Yes      Comment: rarely   ??? Drug Use: No      Comment: narcotic seeking per hospital hx.   ??? Sexually Active: Yes -- Female partner(s)     Birth Control/ Protection: Surgical     Other Topics Concern   ??? Not on file     Social  History Narrative   ??? No narrative on file                  ALLERGIES: Ativan; Codeine; Influenza virus vaccines; Iodinated contrast media - iv dye; Ketorolac tromethamine; Morphine; Motrin; Pcn; Pneumococcal vaccine; Pneumovax 23; Prednisone; and Shellfish containing products      Review of Systems   HENT: Positive for dental problem. Negative for sore throat, trouble swallowing and voice change.        Filed Vitals:    03/10/12 1634   BP: 163/67   Pulse: 88   Temp: 97.7 ??F (36.5 ??C)   Resp: 20  Height: 5\' 4"  (1.626 m)   Weight: 58.06 kg (128 lb)   SpO2: 96%            Physical Exam   Constitutional: She is oriented to person, place, and time. She appears well-developed and well-nourished.   HENT:   Head: Normocephalic and atraumatic.   Mouth/Throat: Oropharynx is clear and moist. No oropharyngeal exudate.   #13 or  #14 with tiny chip fracture posterior labial occlusive aspect     Eyes: Conjunctivae are normal. Pupils are equal, round, and reactive to light.   Neck: Normal range of motion. Neck supple.   Pulmonary/Chest: Effort normal. No respiratory distress.   Musculoskeletal: Normal range of motion.   Neurological: She is alert and oriented to person, place, and time.   Skin: Skin is warm and dry. She is not diaphoretic.   Psychiatric: She has a normal mood and affect. Her behavior is normal.        MDM    Procedures

## 2012-03-15 NOTE — ED Notes (Signed)
Report to Donna RN

## 2012-03-15 NOTE — ED Notes (Signed)
I have reviewed discharge instructions with the patient.  The patient verbalized understanding.

## 2012-03-15 NOTE — ED Notes (Signed)
Pt states she has chest pain that is hurting through to right lung/ states she is having pain with breathing

## 2012-03-15 NOTE — ED Provider Notes (Signed)
Patient is a 47 y.o. female presenting with chest pain. [name]   Chest Pain (Angina)   This is a recurrent problem. The current episode started 6 to 12 hours ago. The problem has not changed since onset.Duration of episode(s) is 7 hours. The problem occurs constantly. The pain is associated with normal activity. The pain is present in the right side. The pain is at a severity of 8/10. The quality of the pain is described as sharp. Radiates to: right shoulder blade. The symptoms are aggravated by deep breathing. Pertinent negatives include no abdominal pain, no back pain, no claudication, no cough, no diaphoresis, no dizziness, no exertional chest pressure, no fever, no headaches, no hemoptysis, no irregular heartbeat, no leg pain, no lower extremity edema, no malaise/fatigue, no nausea, no near-syncope, no numbness, no orthopnea, no palpitations, no PND, no shortness of breath (mild increased DOE), no sputum production, no vomiting and no weakness. She has tried rest for the symptoms. The treatment provided no relief. Risk factors: factor 5 and 2  deficeincy. Her past medical history is significant for PE.Her past medical history does not include aneurysm, cancer, DM, DVT, HTN or CHF. Procedural history includes [place] stents.       Past Medical History   Diagnosis Date   ??? 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 Factor V Leiden mutation    ??? Chronic kidney disease      kidney stones   ??? Neurological disorder      migraines   ??? Psychiatric disorder      depression   ??? Infectious disease 02/2009      Hx MRSA / E.Coli bacteremia, MRSA wound infection   ??? Thromboembolus      Factor 5, Factor 2        Past Surgical History   Procedure Laterality Date   ??? Cystoscopy  ???   ??? Lithotripsy  2006   ??? Hx tonsillectomy     ??? Hx urological       cystoscopex13   ??? Hx appendectomy  ???   ??? Hx cesarean section     ??? Hx tubal ligation     ??? Pr vascular surgery procedure unlist        Port/removal   ??? Hx vascular access  July, 2010     Power Port placed in [place], [place]., removed   ??? Hx vascular access       pt has current rt subclavian port         Family History   Problem Relation Age of Onset   ??? Cancer Father      died from colon ca.   ??? Cancer Maternal Grandmother      metastatic breast CA.        History     Social History   ??? Marital Status: DIVORCED     Spouse Name: N/A     Number of Children: N/A   ??? Years of Education: N/A     Occupational History   ??? Not on file.     Social History Main Topics   ??? Smoking status: Never Smoker    ??? Smokeless tobacco: Never Used   ??? Alcohol Use: Yes      Comment: rarely   ??? Drug Use: No      Comment: narcotic seeking per hospital hx.   ??? Sexually Active: Yes -- Female partner(s)  Birth Control/ Protection: Surgical     Other Topics Concern   ??? Not on file     Social History Narrative   ??? No narrative on file                  ALLERGIES: Ativan; Codeine; Influenza virus vaccines; Iodinated contrast media - iv dye; Ketorolac tromethamine; Morphine; Motrin; Pcn; Pneumococcal vaccine; Pneumovax 23; Prednisone; and Shellfish containing products      Review of Systems   Constitutional: Negative for fever, chills, malaise/fatigue and diaphoresis.   Respiratory: Negative for cough, hemoptysis, sputum production and shortness of breath (mild increased DOE).    Cardiovascular: Positive for chest pain. Negative for palpitations, orthopnea, claudication, PND and near-syncope.   Gastrointestinal: Negative for nausea, vomiting and abdominal pain.   Musculoskeletal: Negative for back pain.   Neurological: Negative for dizziness, weakness, numbness and headaches.   All other systems reviewed and are negative.        Filed Vitals:    03/15/12 1536   BP: 152/86   Pulse: 102   Temp: 98 ??F (36.7 ??C)   Resp: 15   Height: 5\' 4"  (1.626 m)   Weight: 60.782 kg (134 lb)   SpO2: 96%            Physical Exam   Nursing note and vitals reviewed.  Constitutional: She is oriented  to person, place, and time. She appears well-developed and well-nourished. She appears distressed (mild).   HENT:   Head: Normocephalic and atraumatic.   Right Ear: Tympanic membrane and external ear normal.   Left Ear: Tympanic membrane and external ear normal.   Mouth/Throat: Oropharynx is clear and moist.   Eyes: Conjunctivae and EOM are normal. Pupils are equal, round, and reactive to light.   Neck: Normal range of motion. Neck supple. No tracheal deviation present.   Cardiovascular: Normal rate, regular rhythm, normal heart sounds and intact distal pulses.  Exam reveals no gallop and no friction rub.    No murmur heard.  Pulmonary/Chest: Effort normal and breath sounds normal. No respiratory distress. She has no wheezes. She exhibits no tenderness.   Abdominal: Soft. Bowel sounds are normal. She exhibits no distension and no mass. There is no tenderness. There is no rebound and no guarding.   Musculoskeletal: Normal range of motion. She exhibits no edema.   Lymphadenopathy:     She has no cervical adenopathy.   Neurological: She is alert and oriented to person, place, and time. She displays normal reflexes. No cranial nerve deficit.   Skin: Skin is warm and dry. No rash noted. She is not diaphoretic. No erythema.   Psychiatric: She has a normal mood and affect.        MDM     Amount and/or Complexity of Data Reviewed:   Clinical lab tests:  Ordered and reviewed  Tests in the radiology section of CPT??:  Ordered and reviewed   Review and summarize past medical records:  Yes   Independant visualization of image, tracing, or specimen:  Yes  Risk of Significant Complications, Morbidity, and/or Mortality:   Presenting problems:  High  Diagnostic procedures:  Moderate  Management options:  Moderate  Progress:   Patient progress:  Improved and stable      Procedures    The patient was observed in the ED.  INR > 9, making any clot highly unlikely.  No cough and pain worse with movement and deep inspiration, suspect  musculoskeletal in nature at this time.  Results Reviewed:      Recent Results (from the past 24 hour(s))   EKG, 12 LEAD, INITIAL    Collection Time     03/15/12  3:34 PM       Result Value Range    Systolic BP        Diastolic BP        Ventricular Rate 103      Atrial Rate 103      P-R Interval 168      QRS Duration 88      Q-T Interval 340      QTC Calculation (Bezet) 445      Calculated P Axis 58      Calculated R Axis 86      Calculated T Axis 47      Diagnosis        Value: Sinus tachycardia      Otherwise normal ECG      When compared with ECG of 23-Jan-2012 16:42,      No significant change was found   POC TROPONIN-I    Collection Time     03/15/12  6:55 PM       Result Value Range    Troponin-I (POC) 0  0.0 - 0.08 ng/ml   PROTHROMBIN TIME    Collection Time     03/15/12  6:58 PM       Result Value Range    Prothrombin time >82.0 (*) 9.6 - 11.6 sec    INR >9.0 (*) 0.9 - 1.2     D DIMER    Collection Time     03/15/12  6:58 PM       Result Value Range    D DIMER <0.19  <0.55 ug/ml(FEU)   CBC WITH AUTOMATED DIFF    Collection Time     03/15/12  6:58 PM       Result Value Range    WBC 6.9  4.3 - 11.1 K/uL    RBC 3.47 (*) 4.05 - 5.25 M/uL    HGB 10.8 (*) 11.7 - 15.4 g/dL    HCT 16.1 (*) 09.6 - 46.3 %    MCV 93.7  79.6 - 97.8 FL    MCH 31.1  26.1 - 32.9 PG    MCHC 33.2  31.4 - 35.0 g/dL    RDW 04.5  40.9 - 81.1 %    PLATELET 256  150 - 450 K/uL    MPV 10.1 (*) 10.8 - 14.1 FL    DF AUTOMATED      NEUTROPHILS 58  43 - 78 %    LYMPHOCYTES 30  13 - 44 %    MONOCYTES 7  4.0 - 12.0 %    EOSINOPHILS 5  0.5 - 7.8 %    BASOPHILS 0  0.0 - 2.0 %    IMMATURE GRANULOCYTES 0.1  0.0 - 5.0 %    ABS. NEUTROPHILS 4.0  1.7 - 8.2 K/UL    ABS. LYMPHOCYTES 2.1  0.5 - 4.6 K/UL    ABS. MONOCYTES 0.5  0.1 - 1.3 K/UL    ABS. EOSINOPHILS 0.3  0.0 - 0.8 K/UL    ABS. BASOPHILS 0.0  0.0 - 0.2 K/UL    ABS. IMM. GRANS. 0.0  0.0 - 0.5 K/UL   METABOLIC PANEL, BASIC    Collection Time     03/15/12  6:58 PM       Result Value Range    Sodium 142   136 - 145  MMOL/L    Potassium 3.5  3.5 - 5.1 MMOL/L    Chloride 109 (*) 98 - 107 MMOL/L    CO2 24  21 - 32 MMOL/L    Anion gap 9  7 - 16 mmol/L    Glucose 84  65 - 100 MG/DL    BUN 8  6 - 23 MG/DL    Creatinine 1.61 (*) 0.6 - 1.0 MG/DL    GFR est AA >09  >60 ml/min/1.66m2    GFR est non-AA >60  >60 ml/min/1.97m2    Calcium 9.2  8.3 - 10.4 MG/DL   TROPONIN I    Collection Time     03/15/12  6:58 PM       Result Value Range    Troponin-I, Qt. <0.02 (*) 0.02 - 0.05 NG/ML   BNP    Collection Time     03/15/12  7:15 PM       Result Value Range    BNP 9         I discussed the results of all labs, procedures, radiographs, and treatments with the patient and available family.  Treatment plan is agreed upon and the patient is ready for discharge.  All voiced understanding of the discharge plan and medication instructions or changes as appropriate.  Questions about treatment in the ED were answered.  All were encouraged to return should symptoms worsen or new problems develop.

## 2012-03-15 NOTE — ED Notes (Signed)
Patient describes pain as same as previous PE's.

## 2012-03-15 NOTE — ED Notes (Signed)
Onset about 3 hours PTA/ pt is currently being treated for blood clots in lung

## 2012-03-16 LAB — D-DIMER, QUANTITATIVE: D-Dimer, Quant: <0.19 ug/ml(FEU) (ref <0.55)

## 2012-03-16 LAB — CBC WITH AUTOMATED DIFF
ABS. BASOPHILS: 0 10*3/uL (ref 0.0–0.2)
ABS. EOSINOPHILS: 0.3 10*3/uL (ref 0.0–0.8)
ABS. IMM. GRANS.: 0 10*3/uL (ref 0.0–0.5)
ABS. LYMPHOCYTES: 2.1 10*3/uL (ref 0.5–4.6)
ABS. MONOCYTES: 0.5 10*3/uL (ref 0.1–1.3)
ABS. NEUTROPHILS: 4 10*3/uL (ref 1.7–8.2)
BASOPHILS: 0 % (ref 0.0–2.0)
EOSINOPHILS: 5 % (ref 0.5–7.8)
HCT: 32.5 % — ABNORMAL LOW (ref 35.8–46.3)
HGB: 10.8 g/dL — ABNORMAL LOW (ref 11.7–15.4)
IMMATURE GRANULOCYTES: 0.1 % (ref 0.0–5.0)
LYMPHOCYTES: 30 % (ref 13–44)
MCH: 31.1 PG (ref 26.1–32.9)
MCHC: 33.2 g/dL (ref 31.4–35.0)
MCV: 93.7 FL (ref 79.6–97.8)
MONOCYTES: 7 % (ref 4.0–12.0)
MPV: 10.1 FL — ABNORMAL LOW (ref 10.8–14.1)
NEUTROPHILS: 58 % (ref 43–78)
PLATELET: 256 10*3/uL (ref 150–450)
RBC: 3.47 M/uL — ABNORMAL LOW (ref 4.05–5.25)
RDW: 13.8 % (ref 11.9–14.6)
WBC: 6.9 10*3/uL (ref 4.3–11.1)

## 2012-03-16 LAB — TROPONIN I: Troponin-I, Qt.: <0.02 NG/ML — ABNORMAL LOW (ref 0.02–0.05)

## 2012-03-16 LAB — EKG, 12 LEAD, INITIAL
Atrial Rate: 103 {beats}/min
Calculated P Axis: 58 degrees
Calculated R Axis: 86 degrees
Calculated T Axis: 47 degrees
P-R Interval: 168 ms
Q-T Interval: 340 ms
QRS Duration: 88 ms
QTC Calculation (Bezet): 445 ms
Ventricular Rate: 103 {beats}/min

## 2012-03-16 LAB — PROTHROMBIN TIME + INR
INR: >9 — CR (ref 0.9–1.2)
Prothrombin time: >82 s — ABNORMAL HIGH (ref 9.6–11.6)

## 2012-03-16 LAB — METABOLIC PANEL, BASIC
Anion gap: 9 mmol/L (ref 7–16)
BUN: 8 MG/DL (ref 6–23)
CO2: 24 MMOL/L (ref 21–32)
Calcium: 9.2 MG/DL (ref 8.3–10.4)
Chloride: 109 MMOL/L — ABNORMAL HIGH (ref 98–107)
Creatinine: 0.57 MG/DL — ABNORMAL LOW (ref 0.6–1.0)
GFR est AA: >60 mL/min/{1.73_m2} (ref >60)
GFR est non-AA: >60 mL/min/{1.73_m2} (ref >60)
Glucose: 84 MG/DL (ref 65–100)
Potassium: 3.5 MMOL/L (ref 3.5–5.1)
Sodium: 142 MMOL/L (ref 136–145)

## 2012-03-16 LAB — D DIMER: D DIMER: <0.19 ug/ml(FEU) (ref <0.55)

## 2012-03-16 LAB — BNP: BNP: 9 pg/mL

## 2012-03-16 LAB — POC TROPONIN: Troponin-I (POC): 0 ng/ml (ref 0.0–0.08)

## 2012-03-16 MED ADMIN — diphenhydrAMINE (BENADRYL) injection 12.5 mg: INTRAVENOUS | NDC 63323066401

## 2012-03-16 MED ADMIN — phytonadione (MEPHYTON) tablet 2.5 mg: ORAL | @ 01:00:00 | NDC 25010040515

## 2012-03-16 MED ADMIN — heparin (porcine) pf 100 unit/mL: @ 02:00:00 | NDC 17474012503

## 2012-03-16 MED ADMIN — HYDROmorphone (PF) (DILAUDID) injection 0.5 mg: INTRAVENOUS | NDC 00409128331

## 2012-03-16 MED ADMIN — HYDROmorphone (PF) (DILAUDID) injection 1 mg: INTRAVENOUS | @ 01:00:00 | NDC 00409128331

## 2012-03-16 MED ADMIN — ondansetron (ZOFRAN) injection 4 mg: INTRAVENOUS | NDC 00409475503

## 2012-03-16 MED FILL — HYDROMORPHONE (PF) 1 MG/ML IJ SOLN: 1 mg/mL | INTRAMUSCULAR | Qty: 1

## 2012-03-16 MED FILL — ONDANSETRON (PF) 4 MG/2 ML INJECTION: 4 mg/2 mL | INTRAMUSCULAR | Qty: 2

## 2012-03-16 MED FILL — MEPHYTON 5 MG TABLET: 5 mg | ORAL | Qty: 1

## 2012-03-16 MED FILL — DIPHENHYDRAMINE HCL 50 MG/ML IJ SOLN: 50 mg/mL | INTRAMUSCULAR | Qty: 1

## 2012-03-16 MED FILL — HEPARIN LOCK FLUSH (PORCINE) (PF) 100 UNIT/ML INTRAVENOUS SYRINGE: 100 unit/mL | INTRAVENOUS | Qty: 3

## 2012-03-23 LAB — METABOLIC PANEL, BASIC
Anion gap: 7 mmol/L (ref 7–16)
BUN: 9 MG/DL (ref 6–23)
CO2: 28 MMOL/L (ref 21–32)
Calcium: 9.5 MG/DL (ref 8.3–10.4)
Chloride: 106 MMOL/L (ref 98–107)
Creatinine: 0.97 MG/DL (ref 0.6–1.0)
GFR est AA: >60 mL/min/{1.73_m2} (ref >60)
GFR est non-AA: >60 mL/min/{1.73_m2} (ref >60)
Glucose: 106 MG/DL — ABNORMAL HIGH (ref 65–100)
Potassium: 4.3 MMOL/L (ref 3.5–5.1)
Sodium: 141 MMOL/L (ref 136–145)

## 2012-03-23 LAB — POC TROPONIN
Troponin-I (POC): 0 ng/ml (ref 0.0–0.08)
Troponin-I (POC): 0 ng/ml (ref 0.0–0.08)

## 2012-03-23 LAB — EKG, 12 LEAD, INITIAL
Atrial Rate: 97 {beats}/min
Calculated P Axis: 52 degrees
Calculated R Axis: 65 degrees
Calculated T Axis: 50 degrees
P-R Interval: 158 ms
Q-T Interval: 344 ms
QRS Duration: 82 ms
QTC Calculation (Bezet): 436 ms
Ventricular Rate: 97 {beats}/min

## 2012-03-23 LAB — CBC WITH AUTOMATED DIFF
ABS. BASOPHILS: 0 10*3/uL (ref 0.0–0.2)
ABS. EOSINOPHILS: 0.3 10*3/uL (ref 0.0–0.8)
ABS. IMM. GRANS.: 0 10*3/uL (ref 0.0–0.5)
ABS. LYMPHOCYTES: 1.6 10*3/uL (ref 0.5–4.6)
ABS. MONOCYTES: 0.7 10*3/uL (ref 0.1–1.3)
ABS. NEUTROPHILS: 5.5 10*3/uL (ref 1.7–8.2)
BASOPHILS: 0 % (ref 0.0–2.0)
EOSINOPHILS: 3 % (ref 0.5–7.8)
HCT: 32.7 % — ABNORMAL LOW (ref 35.8–46.3)
HGB: 10.9 g/dL — ABNORMAL LOW (ref 11.7–15.4)
IMMATURE GRANULOCYTES: 0.1 % (ref 0.0–5.0)
LYMPHOCYTES: 19 % (ref 13–44)
MCH: 31.3 PG (ref 26.1–32.9)
MCHC: 33.3 g/dL (ref 31.4–35.0)
MCV: 94 FL (ref 79.6–97.8)
MONOCYTES: 8 % (ref 4.0–12.0)
MPV: 10.2 FL — ABNORMAL LOW (ref 10.8–14.1)
NEUTROPHILS: 70 % (ref 43–78)
PLATELET: 367 10*3/uL (ref 150–450)
RBC: 3.48 M/uL — ABNORMAL LOW (ref 4.05–5.25)
RDW: 13.6 % (ref 11.9–14.6)
WBC: 8 10*3/uL (ref 4.3–11.1)

## 2012-03-23 LAB — PROTHROMBIN TIME + INR
INR: 1.2 (ref 0.9–1.2)
Prothrombin time: 13.5 s — ABNORMAL HIGH (ref 9.6–11.6)

## 2012-03-23 MED ADMIN — enoxaparin (LOVENOX) injection 90 mg: SUBCUTANEOUS | @ 19:00:00 | NDC 00075802001

## 2012-03-23 MED ADMIN — traMADol (ULTRAM) tablet 50 mg: ORAL | @ 20:00:00 | NDC 62584055911

## 2012-03-23 MED ADMIN — acetaminophen (TYLENOL) tablet 1,000 mg: ORAL | @ 19:00:00 | NDC 00904198861

## 2012-03-23 MED FILL — LOVENOX 100 MG/ML SUBCUTANEOUS SYRINGE: 100 mg/mL | SUBCUTANEOUS | Qty: 1

## 2012-03-23 MED FILL — MAPAP EXTRA STRENGTH 500 MG TABLET: 500 mg | ORAL | Qty: 2

## 2012-03-23 MED FILL — TRAMADOL 50 MG TAB: 50 mg | ORAL | Qty: 1

## 2012-03-23 NOTE — ED Notes (Signed)
While awaiting V/Q scan, the pt apparently discontinued the IV from her port and left the department without notification.  Pt had been asking repeatedly for additional pain medication and was given additional Tramadol. Her repeat troponin being negative, it is felt the pt was not having any acute, life threatening disease process.

## 2012-03-23 NOTE — ED Notes (Signed)
Pt ambulating to restroom.

## 2012-03-23 NOTE — ED Notes (Signed)
Attempted to check on pt.  Port [employer] on bed and pt not found in room.  MD notified.

## 2012-03-23 NOTE — ED Notes (Signed)
Pt resting in bed, eyes closed, breathing even and unlabored.  VS and Cardiac monitoring in place.

## 2012-03-23 NOTE — ED Provider Notes (Signed)
Patient is a 47 y.o. female presenting with chest pain. [name]   Chest Pain (Angina)   This is a new problem. The current episode started 3 to 5 hours ago. The problem has not changed since onset.The problem occurs constantly. Associated with: nothing. The pain is moderate. The quality of the pain is described as sharp. The pain radiates to the upper back. Exacerbated by: nothing. Associated symptoms include back pain. Pertinent negatives include no abdominal pain, no cough, no diaphoresis, no fever, no hemoptysis, no nausea, no shortness of breath and no vomiting. She has tried nothing for the symptoms. Her past medical history is significant for PE.       Past Medical History   Diagnosis Date   ??? 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 Factor V Leiden mutation    ??? Chronic kidney disease      kidney stones   ??? Neurological disorder      migraines   ??? Psychiatric disorder      depression   ??? Infectious disease 02/2009      Hx MRSA / E.Coli bacteremia, MRSA wound infection   ??? Thromboembolus      Factor 5, Factor 2   ??? Factor V deficiency    ??? Factor II deficiency         Past Surgical History   Procedure Laterality Date   ??? Cystoscopy  ???   ??? Lithotripsy  2006   ??? Hx tonsillectomy     ??? Hx urological       cystoscopex13   ??? Hx appendectomy  ???   ??? Hx cesarean section     ??? Hx tubal ligation     ??? Pr vascular surgery procedure unlist       Port/removal   ??? Hx vascular access  July, 2010     Power Port placed in [place], [place]., removed   ??? Hx vascular access       pt has current rt subclavian port         Family History   Problem Relation Age of Onset   ??? Cancer Father      died from colon ca.   ??? Cancer Maternal Grandmother      metastatic breast CA.        History     Social History   ??? Marital Status: DIVORCED     Spouse Name: N/A     Number of Children: N/A   ??? Years of Education: N/A     Occupational History   ??? Not on file.     Social History Main Topics   ???  Smoking status: Never Smoker    ??? Smokeless tobacco: Never Used   ??? Alcohol Use: Yes      Comment: rarely   ??? Drug Use: No      Comment: narcotic seeking per hospital hx.   ??? Sexually Active: Yes -- Female partner(s)     Birth Control/ Protection: Surgical     Other Topics Concern   ??? Not on file     Social History Narrative   ??? No narrative on file                  ALLERGIES: Ativan; Codeine; Influenza virus vaccines; Iodinated contrast media - iv dye; Ketorolac tromethamine; Morphine; Motrin; Pcn; Pneumococcal vaccine; Pneumovax 23; Prednisone; and Shellfish containing products  Review of Systems   Constitutional: Negative for fever, chills and diaphoresis.   HENT: Negative for congestion and rhinorrhea.    Respiratory: Negative for cough, hemoptysis and shortness of breath.    Cardiovascular: Positive for chest pain. Negative for leg swelling.   Gastrointestinal: Negative for nausea, vomiting, abdominal pain and diarrhea.   Endocrine: Negative for polydipsia and polyuria.   Genitourinary: Negative for dysuria and urgency.   Musculoskeletal: Positive for back pain.       Filed Vitals:    03/23/12 1239   BP: 124/66   Pulse: 98   Temp: 98.3 ??F (36.8 ??C)   Resp: 17   Height: 5\' 4"  (1.626 m)   Weight: 61.689 kg (136 lb)   SpO2: 97%            Physical Exam   Constitutional: She is oriented to person, place, and time. She appears well-developed and well-nourished.   HENT:   Mouth/Throat: Oropharynx is clear and moist. No oropharyngeal exudate.   Eyes: Conjunctivae are normal. Pupils are equal, round, and reactive to light.   Neck: Normal range of motion. Neck supple.   Cardiovascular: Normal rate, regular rhythm and normal heart sounds.    Pulmonary/Chest: Effort normal and breath sounds normal.   Abdominal: Soft. Bowel sounds are normal.   Musculoskeletal: Normal range of motion.   Neurological: She is alert and oriented to person, place, and time.   Skin: Skin is warm and dry.        MDM     Differential Diagnosis;  Clinical Impression; Plan:     Pt recently with inr of 9 and received vitamin k per her report.  HAs h/o PE and multiple, multiple ED visits for chest pain.  INR now sub therapeutic.  On coumadin.  aAllergic to IV contrast and multiple medication.  Headache from ntg given by ems treated with tylenol.  Suspect drug seeking.  WIll dose with lovenox given sub therapeutic INR.    Doubt PE-not tachycardic, not hypoxic, not tachypneac.  Will get VQ given she is subtherapeutic and r/o MI with 3 hour cardica enzyme.  D/C home if both negative.  Amount and/or Complexity of Data Reviewed:   Clinical lab tests:  Ordered and reviewed  Tests in the radiology section of CPT??:  Ordered and reviewed      Procedures    2:55 PM  Pt c/o headache from ntg given by ems.  Wants something else for pain and accepetd tylenol.  Was informed no narcotics would be given.

## 2012-03-23 NOTE — ED Notes (Signed)
Pt presents to ED with a CC of chest pain.  Pt has had nitro x 2, asa 324 by ems.

## 2012-03-23 NOTE — ED Notes (Signed)
Pt requesting MD.  Informed MD.

## 2012-03-24 MED FILL — LOVENOX 100 MG/ML SUBCUTANEOUS SYRINGE: 100 mg/mL | SUBCUTANEOUS | Qty: 10

## 2012-04-21 DIAGNOSIS — I2699 Other pulmonary embolism without acute cor pulmonale: Secondary | ICD-10-CM

## 2012-04-21 NOTE — ED Notes (Signed)
Xray called to call in nuclear med tech.

## 2012-04-21 NOTE — ED Notes (Addendum)
Dr. Domenic Polite at bedside. No s/s of distress. resp even and unlabored

## 2012-04-21 NOTE — ED Notes (Signed)
Patient resting in bed resp even and unlabored no s/s of distress

## 2012-04-21 NOTE — ED Notes (Signed)
Patient c/o mid upper chest pain since 4 pm.  History of PE.  Given nitroglycerin by EMS without relief.  Taking lovenox injections BID.  VSS.  Refuses peripheral sticks for blood work.  Informed of need for MD order to access port.  CC notified.

## 2012-04-21 NOTE — ED Notes (Signed)
Nuclear med return call to ER. Updated on patient allergic to dye and shellfish. Needs more of the isotope before doing scan. ETA unknown.

## 2012-04-21 NOTE — ED Notes (Signed)
Port accessed per sterile procedure patient tolerated.

## 2012-04-21 NOTE — ED Provider Notes (Addendum)
HPI Comments: Has had PE in the past with same pain, currently on lovenox    Patient is a 47 y.o. female presenting with chest pain. [name]   Chest Pain (Angina)   This is a new problem. The current episode started 6 to 12 hours ago (1600). The problem has been gradually worsening. The problem occurs constantly. The pain is associated with breathing and coughing. The pain is present in the left side. The pain is moderate. The quality of the pain is described as sharp and stabbing. The pain radiates to the upper back. The symptoms are aggravated by movement. Associated symptoms include cough, nausea and shortness of breath. Pertinent negatives include no diaphoresis and no fever. She has tried nothing for the symptoms. No risk factors for CADHer past medical history is significant for PE.       Past Medical History   Diagnosis Date   ??? 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 Factor V Leiden mutation    ??? Chronic kidney disease      kidney stones   ??? Neurological disorder      migraines   ??? Psychiatric disorder      depression   ??? Infectious disease 02/2009      Hx MRSA / E.Coli bacteremia, MRSA wound infection   ??? Thromboembolus      Factor 5, Factor 2   ??? Factor V deficiency    ??? Factor II deficiency         Past Surgical History   Procedure Laterality Date   ??? Cystoscopy  ???   ??? Lithotripsy  2006   ??? Hx tonsillectomy     ??? Hx urological       cystoscopex13   ??? Hx appendectomy  ???   ??? Hx cesarean section     ??? Hx tubal ligation     ??? Pr vascular surgery procedure unlist       Port/removal   ??? Hx vascular access  July, 2010     Power Port placed in [place], [place] removed   ??? Hx vascular access       pt has current rt subclavian port         Family History   Problem Relation Age of Onset   ??? Cancer Father      died from colon ca.   ??? Cancer Maternal Grandmother      metastatic breast CA.        History     Social History   ??? Marital Status: DIVORCED     Spouse Name:  N/A     Number of Children: N/A   ??? Years of Education: N/A     Occupational History   ??? Not on file.     Social History Main Topics   ??? Smoking status: Never Smoker    ??? Smokeless tobacco: Never Used   ??? Alcohol Use: Yes      Comment: rarely   ??? Drug Use: No      Comment: narcotic seeking per hospital hx.   ??? Sexually Active: Yes -- Female partner(s)     Birth Control/ Protection: Surgical     Other Topics Concern   ??? Not on file     Social History Narrative   ??? No narrative on file                  ALLERGIES: Ativan;  Codeine; Influenza virus vaccines; Iodinated contrast media - iv dye; Ketorolac tromethamine; Morphine; Motrin; Pcn; Pneumococcal vaccine; Pneumovax 23; Prednisone; and Shellfish containing products      Review of Systems   Constitutional: Negative for fever and diaphoresis.   Respiratory: Positive for cough and shortness of breath.    Cardiovascular: Positive for chest pain.   Gastrointestinal: Positive for nausea.       Filed Vitals:    04/21/12 2200 04/21/12 2202 04/21/12 2210 04/21/12 2220   BP:  113/53     Pulse: 84  75 94   Temp:       Resp:       Height:       Weight:       SpO2: 99% 98% 100% 98%            Physical Exam   Nursing note and vitals reviewed.  Constitutional: She is oriented to person, place, and time. She appears well-developed and well-nourished. She appears distressed.   HENT:   Head: Normocephalic and atraumatic.   Eyes: Conjunctivae are normal. Pupils are equal, round, and reactive to light.   Neck: Normal range of motion. Neck supple.   Cardiovascular: Normal rate and regular rhythm.    Pulmonary/Chest: Effort normal and breath sounds normal.   Abdominal: Soft. Bowel sounds are normal.   Musculoskeletal: She exhibits no edema and no tenderness.   Neurological: She is alert and oriented to person, place, and time.   Skin: Skin is warm and dry.   Psychiatric: She has a normal mood and affect. Her behavior is normal.        MDM     Differential Diagnosis; Clinical Impression;  Plan:     1:26 AM Discussed results with pt, need for admission. VQ shows multiple small bilat filling defects, likely PEs despite her use of lovenox. Spoke with Hospitalist, to see pt  Amount and/or Complexity of Data Reviewed:   Clinical lab tests:  Ordered and reviewed  Tests in the radiology section of CPT??:  Ordered   Discuss the patient with another provider:  Yes      Procedures

## 2012-04-22 ENCOUNTER — Inpatient Hospital Stay
Admit: 2012-04-22 | Discharge: 2012-04-23 | Disposition: A | Discharge status: Other Acute Inpatient Hospital | Payer: MEDICARE | Attending: Internal Medicine | Admitting: Internal Medicine

## 2012-04-22 LAB — METABOLIC PANEL, COMPREHENSIVE
A-G Ratio: 1.1 — ABNORMAL LOW (ref 1.2–3.5)
ALT (SGPT): 25 U/L (ref 12–65)
AST (SGOT): 21 U/L (ref 15–37)
Albumin: 3.8 g/dL (ref 3.5–5.0)
Alk. phosphatase: 141 U/L — ABNORMAL HIGH (ref 50–136)
Anion gap: 11 mmol/L (ref 7–16)
BUN: 11 MG/DL (ref 6–23)
Bilirubin, total: 0.3 MG/DL (ref 0.2–1.1)
CO2: 23 mmol/L (ref 21–32)
Calcium: 9.6 MG/DL (ref 8.3–10.4)
Chloride: 106 mmol/L (ref 98–107)
Creatinine: 0.71 MG/DL (ref 0.6–1.0)
GFR est AA: >60 mL/min/{1.73_m2} (ref >60)
GFR est non-AA: >60 mL/min/{1.73_m2} (ref >60)
Globulin: 3.5 g/dL (ref 2.3–3.5)
Glucose: 95 mg/dL (ref 65–100)
Potassium: 3.6 mmol/L (ref 3.5–5.1)
Protein, total: 7.3 g/dL (ref 6.3–8.2)
Sodium: 140 mmol/L (ref 136–145)

## 2012-04-22 LAB — MRSA SCREEN - PCR (NASAL)

## 2012-04-22 LAB — PTT: aPTT: >200 s — CR (ref 23.5–31.7)

## 2012-04-22 LAB — POC TROPONIN: Troponin-I (POC): 0 ng/ml (ref 0.0–0.08)

## 2012-04-22 LAB — CBC WITH AUTOMATED DIFF
ABS. BASOPHILS: 0 10*3/uL (ref 0.0–0.2)
ABS. EOSINOPHILS: 0.2 10*3/uL (ref 0.0–0.8)
ABS. IMM. GRANS.: 0 10*3/uL (ref 0.0–0.5)
ABS. LYMPHOCYTES: 1.7 10*3/uL (ref 0.5–4.6)
ABS. MONOCYTES: 0.5 10*3/uL (ref 0.1–1.3)
ABS. NEUTROPHILS: 3.4 10*3/uL (ref 1.7–8.2)
BASOPHILS: 0 % (ref 0.0–2.0)
EOSINOPHILS: 4 % (ref 0.5–7.8)
HCT: 33.7 % — ABNORMAL LOW (ref 35.8–46.3)
HGB: 11.3 g/dL — ABNORMAL LOW (ref 11.7–15.4)
IMMATURE GRANULOCYTES: 0.2 % (ref 0.0–5.0)
LYMPHOCYTES: 29 % (ref 13–44)
MCH: 31.5 PG (ref 26.1–32.9)
MCHC: 33.5 g/dL (ref 31.4–35.0)
MCV: 93.9 FL (ref 79.6–97.8)
MONOCYTES: 9 % (ref 4.0–12.0)
MPV: 10.2 FL — ABNORMAL LOW (ref 10.8–14.1)
NEUTROPHILS: 58 % (ref 43–78)
PLATELET: 240 10*3/uL (ref 150–450)
RBC: 3.59 M/uL — ABNORMAL LOW (ref 4.05–5.25)
RDW: 12.9 % (ref 11.9–14.6)
WBC: 5.8 10*3/uL (ref 4.3–11.1)

## 2012-04-22 LAB — EKG, 12 LEAD, INITIAL
Atrial Rate: 90 {beats}/min
Calculated P Axis: 65 degrees
Calculated R Axis: 79 degrees
Calculated T Axis: 63 degrees
Diagnosis: NORMAL
P-R Interval: 174 ms
Q-T Interval: 362 ms
QRS Duration: 90 ms
QTC Calculation (Bezet): 442 ms
Ventricular Rate: 90 {beats}/min

## 2012-04-22 MED ADMIN — HYDROmorphone (PF) (DILAUDID) injection 1 mg: INTRAVENOUS | @ 18:00:00 | NDC 00409128331

## 2012-04-22 MED ADMIN — HYDROmorphone (PF) (DILAUDID) injection 1 mg: INTRAVENOUS | @ 06:00:00 | NDC 00409255201

## 2012-04-22 MED ADMIN — heparin 25,000 units in dextrose 500 mL infusion: INTRAVENOUS | @ 19:00:00 | NDC 00264957710

## 2012-04-22 MED ADMIN — ondansetron (ZOFRAN) injection 4 mg: INTRAVENOUS | @ 10:00:00 | NDC 00409475503

## 2012-04-22 MED ADMIN — QUEtiapine (SEROQUEL) tablet 100 mg: ORAL | @ 07:00:00 | NDC 68084053211

## 2012-04-22 MED ADMIN — clonazePAM (KLONOPIN) tablet 1 mg: ORAL | @ 07:00:00 | NDC 51079088201

## 2012-04-22 MED ADMIN — HYDROmorphone (PF) (DILAUDID) injection 1 mg: INTRAVENOUS | @ 02:00:00 | NDC 00409128331

## 2012-04-22 MED ADMIN — nuclear medicine isotope: @ 04:00:00 | NDC 88888888806

## 2012-04-22 MED ADMIN — clonazePAM (KLONOPIN) tablet 1 mg: ORAL | @ 22:00:00 | NDC 51079088201

## 2012-04-22 MED ADMIN — HYDROmorphone (PF) (DILAUDID) injection 1 mg: INTRAVENOUS | @ 04:00:00 | NDC 00409128331

## 2012-04-22 MED ADMIN — mupirocin (BACTROBAN) 2 % ointment: NASAL | @ 13:00:00 | NDC 45802011222

## 2012-04-22 MED ADMIN — heparin 25,000 units in dextrose 500 mL infusion: INTRAVENOUS | @ 23:00:00 | NDC 00264957710

## 2012-04-22 MED ADMIN — HYDROmorphone (PF) (DILAUDID) injection 1 mg: INTRAVENOUS | @ 14:00:00 | NDC 00409128331

## 2012-04-22 MED ADMIN — mupirocin (BACTROBAN) 2 % ointment: NASAL | @ 22:00:00 | NDC 45802011222

## 2012-04-22 MED ADMIN — heparin 25,000 units in dextrose 500 mL infusion: INTRAVENOUS | @ 07:00:00 | NDC 00264957710

## 2012-04-22 MED ADMIN — clonazePAM (KLONOPIN) tablet 1 mg: ORAL | @ 13:00:00 | NDC 51079088201

## 2012-04-22 MED ADMIN — ondansetron (ZOFRAN) injection 4 mg: INTRAVENOUS | @ 02:00:00 | NDC 00641607801

## 2012-04-22 MED ADMIN — saline peripheral flush soln 10 mL: @ 05:00:00 | NDC 87701099893

## 2012-04-22 MED ADMIN — HYDROmorphone (PF) (DILAUDID) injection 1 mg: INTRAVENOUS | @ 22:00:00 | NDC 00409128331

## 2012-04-22 MED ADMIN — HYDROmorphone (PF) (DILAUDID) injection 1 mg: INTRAVENOUS | @ 03:00:00 | NDC 00409128337

## 2012-04-22 MED ADMIN — DULoxetine (CYMBALTA) capsule 90 mg: ORAL | @ 13:00:00 | NDC 68084069211

## 2012-04-22 MED ADMIN — HYDROmorphone (PF) (DILAUDID) injection 1 mg: INTRAVENOUS | @ 10:00:00 | NDC 00409128331

## 2012-04-22 NOTE — Progress Notes (Signed)
TRANSFER - OUT REPORT:    Verbal report given to Genia Harold) on [facility]  being transferred to [facility] Room 3329(unit) for routine progression of care       Report consisted of patient???s Situation, Background, Assessment and   Recommendations(SBAR).     Information from the following report(s) Kardex, ED Summary, MAR and Recent Results was reviewed with the receiving nurse.    Opportunity for questions and clarification was provided.

## 2012-04-22 NOTE — Progress Notes (Signed)
Spoke with  Clydia Llano, RN at [facility] regarding pt critical value of PTT >100.

## 2012-04-22 NOTE — Progress Notes (Signed)
TRANSFER - IN REPORT:    Verbal report received from Netta Cedars, RN(name) on [facility]  being received from ED(unit) for routine progression of care      Report consisted of patient???s Situation, Background, Assessment and   Recommendations(SBAR).     Information from the following report(s) ED Summary was reviewed with the receiving nurse.    Opportunity for questions and clarification was provided.      Assessment completed upon patient???s arrival to unit and care assumed.

## 2012-04-22 NOTE — Progress Notes (Signed)
Critical Result Notification    Received and verbally repeated the following test results PTT greater than 200; Lab sent Stacia, lab tech. Heparin off for an hour; lab results today, 04/22/2012 at 1347.       Additional comments: Primary nurse informed    Alden Server, RN

## 2012-04-22 NOTE — Discharge Summary (Signed)
Pt with factor II and V Leiden deficiency with multiple hospitalizations and ER presentations admitted overnight with CP and SOB with VQ scan here intermediate for PE when prior (prior scan on 01/2012) was low probability.  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  Pt stated she was previously on coumadin and presented to [facility] in mid February and diagnosed with acute PE by CTA chest after she was premedicated due to contrast allergy.  She was therapeutic on coumadin then and considered a treatment failure so she was started on lovenox. She followed up again there again and was just discharged about 5-7 days ago on lovenox. So patient was admitted here out of concerns for recurrent PE and possible treatment failure of lovenox.  After long discussion with her hematologist, who no longer has privileges at this facility but does at [facility], he recommended repeat CTA imaging to see if any new defects and compare with her other CTA's (2 of which she had done recently at [facility]).  There are no CTA chest at this facility for comparison.  If new defects then treatment options would include arixtra or fragmin but stated there was not enough evidence to support xarelto.  If no new defects then most likely pt has not failed lovenox treatment.  Pt has displayed drug seeking behavior, recently leaving the ER after she was not given IV narcotics and often floats between hospitals as a result.  Pt does have a true medical diagnosis which is potentially fatal if she is not on the appropriate treatment.  Treatment failure needs to be tracked closely if this is indeed the case.  Pt was educated on the potential risks including death by her noncompliance with her hematology follow up and hospital hopping.  She voiced understanding of this potential danger.  Dr Mayford Knife at [facility] has kindly accepted the patient in transfer for the recommended work up by her hematologist.  Pt was treated with a heparin drip while  here.    Greater than 90 minutes was spent on discharge and coordination of care.

## 2012-04-22 NOTE — ED Notes (Signed)
Dr. Domenic Polite notified patient in pain.

## 2012-04-22 NOTE — Progress Notes (Signed)
Dual skin assessment completed with primary RN Judge Stall: Pt noted to have bruising noted on abdomen and left thigh. Pt noted to have scars on forearms bilaterally and left chest. Pt noted to have tattoo on lower back. No open or unhealing wounds noted on pt body.

## 2012-04-22 NOTE — ED Notes (Signed)
Ventilation/perfusion lung scan dated 04/21/2012   CLINICAL INFORMATION: Shortness of breath   Dose: 41.6 mCi 90 38M technetium DTPA aerosol, 6.29 mCi 938M technetium MAA   Standard ventilation and perfusion images of the lungs were obtained. On the   ventilation scan, activity appears homogenous through both lungs. On the   perfusion scan, there are multiple small peripheral perfusion defects   bilaterally. No large segmental defects are seen.   IMPRESSION: Small bilateral peripheral perfusion defects. Intermediate   probability of pulmonary emboli

## 2012-04-22 NOTE — ED Notes (Signed)
TRANSFER - OUT REPORT:    Verbal report given to Almira Coaster, RN on [employer]  being transferred to 329 for routine progression of care       Report consisted of patient???s Situation, Background, Assessment and   Recommendations(SBAR).     Information from the following report(s) SBAR, ED Summary and MAR was reviewed with the receiving nurse.    Opportunity for questions and clarification was provided.

## 2012-04-22 NOTE — ED Notes (Signed)
Patient in bed resp even and unlabored

## 2012-04-22 NOTE — Progress Notes (Signed)
Bedside shift change report given to Tonya Merrigan, RN (oncoming nurse) by Gena Perry, RN (offgoing nurse).  Report given with Kardex, MAR and Recent Results.

## 2012-04-22 NOTE — Progress Notes (Signed)
Spiritual Care visit. Initial Visit.    Chaplain listened as patient spoke of the condition for which she is hospitalized, a condition which causes her to have repeated blood clots.    Chaplain prayed for her to be healed and have a full recovery.    Visit by Arelia Sneddon, M.Ed., Th.B. ,Staff Chaplain

## 2012-04-22 NOTE — H&P (Signed)
[facility]                            [address]                           [place], [place]. [address]                                [phone]                              HISTORY AND PHYSICAL    NAME:  Krista Lopez, Krista Lopez                         MR:  [id number]  LOC:  ED  [id number]            SEX:  F               ACCT:  [id number]  DOB:  1965-02-15            AGE:  46              PT:  E  ADMIT:  04/21/2012          DSCH:                 MSV:        PRIMARY CARE PHYSICIAN: Joylene Draft, MD    HEMATOLOGIST: Dr. Anola Gurney    ADMITTING DIAGNOSES  1. Recurrent pulmonary embolism.  2. Acute shortness of breath.  3. Chest pain.    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 She has an IVC filter in place and is followed by Dr. Anola Gurney. She   was on Coumadin and switched completely to Lovenox a week ago.    The patient has had progressive shortness of breath today. It is associated with exertion.   She walks a block or 2 and feels very fatigued and short of breath. She was at [facility] when   she had acute onset of shortness of breath associated with chest pain.She reports gasping  for air . The ambulance was called and the patient was brought to the emergency room.    She had a VQ scan done ( due to allergy to contrast dye ) which showed multiple bilateral   small pleural perfusion defects without large segmental defects, findings are suggestive of   intermediate probability for PE. The patient was given supplemental oxygen and also   analgesics,and the hospitalist service consulted to admit the patient for further management.    REVIEW OF SYSTEMS: Her 10-point review of system is negative except for acute onset   shortness of breath with chest pain as stated in HPI. Specifically, she denies any dizziness.   She denies any cough and no hemoptysis.  She denies any leg pain or swelling.    PAST MEDICAL HISTORY  1. Factor V Leiden deficiency.  2. Factor II deficiency.  3. Recurrent pulmonary embolism.  4. Chronic pain syndrome.  5. Narcotic seeking behavior.  6. Bipolar disorder.  7. History of nephrolithiasis.  8. History of migraine headaches.  PAST SURGICAL HISTORY  1. Status post C-section x2.  2. Status post right subclavian Port-A-Cath placement.  3. Tonsillectomy and adenoidectomy.  4. Status post appendectomy.  5. Status post IVC filter placement.    FAMILY HISTORY  1. Father died of colon cancer.  2. There is breast cancer in her maternal grandmother.    SOCIAL HISTORY: She is single. She has 2 grown daughters. She is a  life-long nonsmoker. She drinks alcohol only very occasionally on her  birthday and also at Christmas.    MEDICATIONS  These have been reviewed and reconciled in the electronic medical records  including:  1. Cymbalta 60 mg daily.  2. Lovenox 60 mg twice a day.  3. Klonopin 1 mg 3 times a day.  4. Seroquel 100 mg at bedtime.  5. Imitrex as needed for migraine headaches.    ALLERGIES  THE PATIENT HAS MULTIPLE DRUG ALLERGIES INCLUDING:  1. ATIVAN, WHICH CAUSES ITCHING.  2. CODEINE CAUSES ITCHING.  3. IODINATED CONTRAST DYE CAUSES A RASH.  4. TORADOL CAUSES A RASH.  5. MORPHINE CAUSES ITCHING.  6. MOTRIN CAUSES STOMACH UPSET.  7. PENICILLIN CAUSES HIVES.  8. PNEUMOCOCCAL VACCINE CAUSES SWELLING.  9. INFLUENZA VACCINE CAUSES SWELLING.  10. PREDNISONE ALSO CAUSES A RASH.  11. SHELLFISH-CONTAINING PRODUCTS ALSO CAUSES A RASH.    PHYSICAL EXAMINATION  VITAL SIGNS: She is afebrile, temperature 98.4, heart rate 97, respiratory rate is 20,   her blood pressure 139/94. Her oxygen saturation 98% on room air. She weighs 142   pounds.  GENERAL: She is a young woman. She is awake, alert. She does not seem to be in   any obvious distress at the moment.  HEENT: Normocephalic, atraumatic head. Extraocular muscles are intact.  NECK: Supple, no JVD, no  thyromegaly.  PULMONARY: She has adequate air entry with faint rhonchi, but no wheezing.  CARDIOVASCULAR: Heart sounds 1 and 2 present, normal. No murmurs.  ABDOMEN: Full, soft, bowel sounds present.  NEUROLOGIC: Grossly intact, nonfocal.  EXTREMITIES: No edema or cyanosis.  SKIN: No ulcers, rashes or bruising noted.    DIAGNOSTIC DATA: VQ scan showed multiple small bilateral peripheral perfusion defects   without large segmental defects, this is suggestive of intermediate pulmonary embolism.   Chest x-ray did not reveal any pulmonary infiltrates.    LABORATORY DATA: WBC is normal at 5.8, hemoglobin 11.3, hematocrit 34, MCV 94,   platelets 240. Electrolytes: Sodium 140, potassium 3.6, chloride 106, bicarbonate 23, BUN 11,   creatinine is 0.7, glucose 95. Calcium is 9.6. AST 21, ALT 25, alkaline phosphatase 141.   Troponin negative at 0.    ASSESSMENT: This is a 47 year old woman with hypercoagulable state, complicated by   recurrent pulmonary embolism, who presents with pleuritic chest pain and dyspnea.    PRINCIPAL DIAGNOSIS: Recurrent pulmonary embolism via VQ scan.    OTHER DIAGNOSES  1. Migraine headaches.  2. Bipolar disorder.  3. Chronic pain with narcotic dependence.    PLAN  1. The patient will be admitted to the remote telemetry floor. She will be started on  Heparin gtt.       Consult has been placed with Hematology/Oncology for input.  2. The patient will continue all other essential home medications.  3. The patient will be on a heart healthy diet.  4. Code status: The patient is a FULL CODE.  5. Disposition: I anticipate the patient will be in the hospital for at least 2 days, following which she  will be discharged home.    TIME SPENT: 55 minutes.                Epifanio Lesches, MD                This is an unverified document unless signed by physician.    TID:  [id number]                                      DT:  04/22/2012 03:19 A  JOB:  [id number]           DOC#:  [id number]           DD:  04/22/2012     cc:   Epifanio Lesches, MD        Joylene Draft, M.D.

## 2012-04-22 NOTE — ED Notes (Signed)
Patient to nuclear med

## 2012-04-22 NOTE — ED Notes (Signed)
Resting in bed resp even and unlabored no s/s of distress

## 2012-04-22 NOTE — Progress Notes (Signed)
Skin assessment completed:  Pt noted to have bruising noted on abdomen and left thigh.  Pt noted to have scars on forearms bilaterally and left chest.  Pt noted to have tattoo on lower back.  No open or unhealing wounds noted on pt body.

## 2012-04-22 NOTE — Progress Notes (Signed)
Bedside shift change report given to Ermalene Searing, RN (oncoming nurse) by Judge Stall, RN (offgoing nurse).  Report given with Kardex, MAR and Recent Results.

## 2012-04-23 LAB — PTT: aPTT: >100 s — CR (ref 23.5–31.7)

## 2012-04-23 MED ADMIN — clonazePAM (KLONOPIN) tablet 1 mg: ORAL | @ 02:00:00 | NDC 51079088201

## 2012-04-23 MED ADMIN — QUEtiapine (SEROQUEL) tablet 100 mg: ORAL | @ 02:00:00 | NDC 68084053211

## 2012-05-18 LAB — PROTHROMBIN TIME + INR
INR: >6.8 — CR (ref 0.9–1.2)
Prothrombin time: >63 s — ABNORMAL HIGH (ref 9.6–11.6)

## 2012-05-18 MED ADMIN — oxyCODONE-acetaminophen (PERCOCET) 5-325 mg per tablet 1 Tab: ORAL | @ 21:00:00 | NDC 68084035511

## 2012-05-18 MED ADMIN — butalbital-acetaminophen-caffeine (FIORICET, ESGIC) per tablet 1 Tab: ORAL | @ 20:00:00 | NDC 00143178701

## 2012-05-18 MED ADMIN — ondansetron (ZOFRAN ODT) tablet 8 mg: ORAL | @ 20:00:00 | NDC 68462015840

## 2012-05-18 NOTE — ED Notes (Cosign Needed)
Pt still refuses head ct, signed out AMA

## 2012-05-18 NOTE — ED Notes (Cosign Needed)
Pt is on 5mg  coumadin very other day along with lovenox

## 2012-05-18 NOTE — ED Provider Notes (Signed)
HPI Comments: Pt with frequent er visits here for headache, dropped off, needs Zenaida Niece to pick her up, no fever, visual changes or neck pain, no lovenox for pe pt has appt with md in 3 days    Patient is a 47 y.o. female presenting with headaches. [name]   Headache   This is a recurrent problem. The current episode started yesterday. The problem occurs constantly. The problem has not changed since onset.The headache is aggravated by an unknown factor. The pain is located in the generalized region. The quality of the pain is described as dull. The pain is at a severity of 8/10. The pain is moderate. Associated symptoms include nausea and vomiting. Pertinent negatives include no fever, no chest pressure, no near-syncope, no orthopnea, no syncope, no weakness, no dizziness and no visual change. She has tried triptan therapy for the symptoms. The treatment provided no relief.        Past Medical History   Diagnosis Date   ??? 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 Factor V Leiden mutation    ??? Chronic kidney disease      kidney stones   ??? Neurological disorder      migraines   ??? Psychiatric disorder      depression   ??? Infectious disease 02/2009      Hx MRSA / E.Coli bacteremia, MRSA wound infection   ??? Thromboembolus      Factor 5, Factor 2   ??? Factor V deficiency    ??? Factor II deficiency         Past Surgical History   Procedure Laterality Date   ??? Cystoscopy  ???   ??? Lithotripsy  2006   ??? Hx tonsillectomy     ??? Hx urological       cystoscopex13   ??? Hx appendectomy  ???   ??? Hx cesarean section     ??? Hx tubal ligation     ??? Pr vascular surgery procedure unlist       Port/removal   ??? Hx vascular access  July, 2010     Power Port placed in [place], [place] removed   ??? Hx vascular access       pt has current rt subclavian port         Family History   Problem Relation Age of Onset   ??? Cancer Father      died from colon ca.   ??? Cancer Maternal Grandmother      metastatic breast  CA.        History     Social History   ??? Marital Status: DIVORCED     Spouse Name: N/A     Number of Children: N/A   ??? Years of Education: N/A     Occupational History   ??? Not on file.     Social History Main Topics   ??? Smoking status: Never Smoker    ??? Smokeless tobacco: Never Used   ??? Alcohol Use: Yes      Comment: rarely   ??? Drug Use: No      Comment: narcotic seeking per hospital hx.   ??? Sexually Active: Yes -- Female partner(s)     Birth Control/ Protection: Surgical     Other Topics Concern   ??? Not on file     Social History Narrative   ??? No narrative on file  ALLERGIES: Ativan; Codeine; Influenza virus vaccines; Iodinated contrast media - iv dye; Ketorolac tromethamine; Morphine; Motrin; Pcn; Pneumococcal vaccine; Pneumovax 23; Prednisone; and Shellfish containing products      Review of Systems   Constitutional: Negative for fever.   Cardiovascular: Negative for orthopnea, syncope and near-syncope.   Gastrointestinal: Positive for nausea and vomiting.   Neurological: Positive for headaches. Negative for dizziness and weakness.   All other systems reviewed and are negative.        Filed Vitals:    05/18/12 1449   BP: 122/84   Pulse: 90   Temp: 98.7 ??F (37.1 ??C)   Resp: 15   Height: 5\' 4"  (1.626 m)   Weight: 63.957 kg (141 lb)   SpO2: 98%            Physical Exam   Nursing note and vitals reviewed.  Constitutional: She is oriented to person, place, and time. She appears well-developed and well-nourished. No distress.   HENT:   Head: Normocephalic and atraumatic.   Right Ear: External ear normal.   Left Ear: External ear normal.   Eyes: Conjunctivae and EOM are normal. Pupils are equal, round, and reactive to light.   Neck: Normal range of motion. Neck supple.   Cardiovascular: Normal rate, regular rhythm and normal heart sounds.    Pulmonary/Chest: Effort normal and breath sounds normal. No respiratory distress. She has no wheezes. She has no rales.   Abdominal: Soft. Bowel sounds are normal.    Musculoskeletal: Normal range of motion. She exhibits no edema.   Neurological: She is alert and oriented to person, place, and time.   Skin: Skin is warm.        MDM     Differential Diagnosis; Clinical Impression; Plan:     inr >6.8 pt refused additional blood draw and head ct, warned pt of risk of bleeding into the brain and possible death, pt discussed with Dr. Konrad Penta, and also spoke with Dr. Sandi Mariscal, do not use vit k and hold coumadn and lovenox   Amount and/or Complexity of Data Reviewed:   Clinical lab tests:  Ordered and reviewed   Discuss the patient with another provider:  Yes  Risk of Significant Complications, Morbidity, and/or Mortality:   Presenting problems:  Moderate  Diagnostic procedures:  Moderate  Management options:  Moderate  Progress:   Patient progress:  Improved and stable      Procedures

## 2012-05-18 NOTE — ED Notes (Signed)
Pt informed of ordered procedures.  Pt refuses any further bloodwork or tests.  AMA signed and pt states understanding.  Theodis Aguas, PA aware.

## 2012-05-18 NOTE — ED Notes (Cosign Needed)
Spoke with Dr Evie Lacks with hematology, advised to hold coumadin and lovenox  until appt on Friday

## 2012-05-18 NOTE — ED Notes (Signed)
History of migraines, took last imitrex this am and then vomited. Pt has not taken other meds.

## 2012-05-18 NOTE — ED Notes (Signed)
Pt states this is typical of her previous migrines

## 2012-05-18 NOTE — ED Provider Notes (Signed)
I was personally available for consultation in the emergency department.  I have reviewed the chart and agree with the documentation recorded by the MLP, including the assessment, treatment plan, and disposition.  Jovana Rembold M Halen Mossbarger, MD

## 2012-05-20 MED ADMIN — ondansetron (ZOFRAN) injection 4 mg: INTRAVENOUS | @ 21:00:00 | NDC 00409475503

## 2012-05-20 MED ADMIN — SUMAtriptan (IMITREX) injection 6 mg: SUBCUTANEOUS | @ 20:00:00 | NDC 63323027301

## 2012-05-20 MED ADMIN — dexamethasone (DECADRON) injection 10 mg: INTRAVENOUS | @ 19:00:00 | NDC 00641036721

## 2012-05-20 MED ADMIN — metoclopramide HCl (REGLAN) injection 10 mg: INTRAVENOUS | @ 19:00:00 | NDC 00409341401

## 2012-05-20 MED ADMIN — sodium chloride (NS) flush 5-10 mL: INTRAVENOUS | @ 19:00:00 | NDC 87701099893

## 2012-05-20 MED ADMIN — diphenhydrAMINE (BENADRYL) 50 mg/mL injection: INTRAVENOUS | @ 21:00:00 | NDC 63323066401

## 2012-05-20 MED ADMIN — diphenhydrAMINE (BENADRYL) injection 50 mg: INTRAVENOUS | @ 19:00:00 | NDC 63323066401

## 2012-05-20 MED ADMIN — morphine injection 4 mg: INTRAVENOUS | @ 21:00:00 | NDC 00409189101

## 2012-05-20 NOTE — ED Provider Notes (Signed)
HPI Comments: Pt here several days ago with ame headache and refused CT for elevated INR of 6.  Now back and concerned she should not have refused CT as she is not better.  No coumadin since then and has appt with her Dr tomorrow regarding resuming coumadin.  Migraine typical of her usual migraines other than duration and most but not all are on the left side.  Pt a frequent ED user.    Patient is a 47 y.o. female presenting with migraines. [name]   Migraine   This is a recurrent problem. The current episode started more than 2 days ago. The problem occurs constantly. The headache is aggravated by bright light (nausea). The pain is located in the parietal and right unilateral region. The quality of the pain is described as throbbing ("like a thunderstorm goping on in there"). The pain is moderate. Associated symptoms include nausea. Pertinent negatives include no fever, no shortness of breath, no weakness and no vomiting. She has tried nothing for the symptoms.        Past Medical History   Diagnosis Date   ??? 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 Factor V Leiden mutation    ??? Chronic kidney disease      kidney stones   ??? Neurological disorder      migraines   ??? Psychiatric disorder      depression   ??? Infectious disease 02/2009      Hx MRSA / E.Coli bacteremia, MRSA wound infection   ??? Thromboembolus      Factor 5, Factor 2   ??? Factor V deficiency    ??? Factor II deficiency         Past Surgical History   Procedure Laterality Date   ??? Cystoscopy  ???   ??? Lithotripsy  2006   ??? Hx tonsillectomy     ??? Hx urological       cystoscopex13   ??? Hx appendectomy  ???   ??? Hx cesarean section     ??? Hx tubal ligation     ??? Pr vascular surgery procedure unlist       Port/removal   ??? Hx vascular access  July, 2010     Power Port placed in [place], [place]., removed   ??? Hx vascular access       pt has current rt subclavian port         Family History   Problem Relation Age of Onset   ???  Cancer Father      died from colon ca.   ??? Cancer Maternal Grandmother      metastatic breast CA.        History     Social History   ??? Marital Status: DIVORCED     Spouse Name: N/A     Number of Children: N/A   ??? Years of Education: N/A     Occupational History   ??? Not on file.     Social History Main Topics   ??? Smoking status: Never Smoker    ??? Smokeless tobacco: Never Used   ??? Alcohol Use: Yes      Comment: rarely   ??? Drug Use: No      Comment: narcotic seeking per hospital hx.   ??? Sexually Active: Yes -- Female partner(s)     Birth Control/ Protection: Surgical     Other Topics Concern   ???  Not on file     Social History Narrative   ??? No narrative on file                  ALLERGIES: Ativan; Codeine; Influenza virus vaccines; Iodinated contrast media - iv dye; Ketorolac tromethamine; Morphine; Motrin; Pcn; Pneumococcal vaccine; Pneumovax 23; Prednisone; and Shellfish containing products      Review of Systems   Constitutional: Negative for fever and chills.   HENT: Negative for congestion and rhinorrhea.    Respiratory: Negative for cough and shortness of breath.    Gastrointestinal: Positive for nausea. Negative for vomiting.   Endocrine: Negative for polydipsia and polyuria.   Genitourinary: Negative for dysuria and frequency.   Neurological: Positive for headaches. Negative for weakness and numbness.       Filed Vitals:    05/20/12 1344   BP: 125/78   Pulse: 90   Temp: 99.2 ??F (37.3 ??C)   Resp: 18   Height: 5\' 4"  (1.626 m)   Weight: 63.957 kg (141 lb)            Physical Exam   Nursing note and vitals reviewed.  Constitutional: She is oriented to person, place, and time. She appears well-developed and well-nourished.   HENT:   Head: Normocephalic and atraumatic.   Mouth/Throat: Oropharynx is clear and moist. No oropharyngeal exudate.   Eyes: Conjunctivae are normal. Pupils are equal, round, and reactive to light.   Neck: Normal range of motion. Neck supple.   Cardiovascular: Normal rate, regular rhythm and normal  heart sounds.    Pulmonary/Chest: Effort normal and breath sounds normal.   Abdominal: Soft. Bowel sounds are normal. She exhibits no distension. There is no tenderness. There is no rebound and no guarding.   Musculoskeletal: Normal range of motion. She exhibits no edema and no tenderness.   Neurological: She is alert and oriented to person, place, and time.   Skin: Skin is warm and dry.        MDM     Differential Diagnosis; Clinical Impression; Plan:     Migraine vs ICH/SAH vs drug seeking/malingering.  Amount and/or Complexity of Data Reviewed:   Clinical lab tests:  Ordered and reviewed  Tests in the radiology section of CPT??:  Ordered and reviewed      Procedures    CT head-negative per radiologist.    Care delayed by blood not being labeld then blood being drawn for her port for her INR which was not coagulable (probably from heparin in the port) and then I asked RN to draw blood peripherally and please not from the port to check her INR.

## 2012-05-21 LAB — PROTHROMBIN TIME + INR
INR: >6.8 — CR (ref 0.9–1.2)
Prothrombin time: >100 s — ABNORMAL HIGH (ref 8.6–12.2)

## 2012-05-21 MED ADMIN — heparin (porcine) pf 100 Units: @ 02:00:00

## 2012-06-11 LAB — URINALYSIS W/ RFLX MICROSCOPIC
Bilirubin: NEGATIVE
Glucose: NEGATIVE mg/dL
Ketone: NEGATIVE mg/dL
Leukocyte Esterase: NEGATIVE
Nitrites: NEGATIVE
Protein: NEGATIVE mg/dL
RBC: >100 /hpf — ABNORMAL HIGH
Specific gravity: 1.014 (ref 1.001–1.023)
Urobilinogen: 0.2 EU/dL (ref 0.2–1.0)
pH (UA): 7.5 (ref 5.0–9.0)

## 2012-06-11 LAB — DRUG SCREEN, URINE
AMPHETAMINES: NEGATIVE
BARBITURATES: NEGATIVE
BENZODIAZEPINES: POSITIVE
COCAINE: NEGATIVE
METHADONE: NEGATIVE
OPIATES: NEGATIVE
PCP(PHENCYCLIDINE): NEGATIVE
THC (TH-CANNABINOL): NEGATIVE

## 2012-06-11 LAB — HCG URINE, QL. - POC: Pregnancy test,urine (POC): NEGATIVE

## 2012-06-11 MED ADMIN — ondansetron (ZOFRAN) injection 4 mg: INTRAVENOUS | @ 17:00:00 | NDC 00409475503

## 2012-06-11 MED ADMIN — heparin (porcine) pf 300 Units: @ 19:00:00

## 2012-06-11 MED ADMIN — sodium chloride 0.9 % bolus infusion 1,000 mL: INTRAVENOUS | @ 17:00:00 | NDC 00409798309

## 2012-06-11 MED ADMIN — HYDROmorphone (PF) (DILAUDID) injection 1 mg: INTRAVENOUS | @ 17:00:00 | NDC 00409255201

## 2012-06-11 NOTE — ED Provider Notes (Signed)
[name] [name]   Flank Pain   This is a recurrent problem. The current episode started yesterday. The problem has not changed since onset.The problem occurs constantly. Patient reports not work related injury.Associated with: kidney stones. The pain is present in the left side. The quality of the pain is described as aching and sharp. The pain radiates to the left groin. The pain is at a severity of 8/10. The pain is moderate. The pain is the same all the time. Pertinent negatives include no fever and no bladder incontinence. She has tried NSAIDs for the symptoms. The treatment provided no relief.        Past Medical History   Diagnosis Date   ??? 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 Factor V Leiden mutation    ??? Chronic kidney disease      kidney stones   ??? Neurological disorder      migraines   ??? Psychiatric disorder      depression   ??? Infectious disease 02/2009      Hx MRSA / E.Coli bacteremia, MRSA wound infection   ??? Thromboembolus      Factor 5, Factor 2   ??? Factor V deficiency    ??? Factor II deficiency         Past Surgical History   Procedure Laterality Date   ??? Cystoscopy  ???   ??? Lithotripsy  2006   ??? Hx tonsillectomy     ??? Hx urological       cystoscopex13   ??? Hx appendectomy  ???   ??? Hx cesarean section     ??? Hx tubal ligation     ??? Pr vascular surgery procedure unlist       Port/removal   ??? Hx vascular access  July, 2010     Power Port placed in [place], [place]., removed   ??? Hx vascular access       pt has current rt subclavian port         Family History   Problem Relation Age of Onset   ??? Cancer Father      died from colon ca.   ??? Cancer Maternal Grandmother      metastatic breast CA.        History     Social History   ??? Marital Status: DIVORCED     Spouse Name: N/A     Number of Children: N/A   ??? Years of Education: N/A     Occupational History   ??? Not on file.     Social History Main Topics   ??? Smoking  status: Never Smoker    ??? Smokeless tobacco: Never Used   ??? Alcohol Use: Yes      Comment: rarely   ??? Drug Use: No      Comment: narcotic seeking per hospital hx.   ??? Sexually Active: Yes -- Female partner(s)     Birth Control/ Protection: Surgical     Other Topics Concern   ??? Not on file     Social History Narrative   ??? No narrative on file                  ALLERGIES: Ativan; Codeine; Influenza virus vaccines; Iodinated contrast media - iv dye; Ketorolac tromethamine; Morphine; Motrin; Pcn; Pneumococcal vaccine; Pneumovax 23; Prednisone; and Shellfish containing products  Review of Systems   Constitutional: Negative for fever.   Genitourinary: Positive for flank pain. Negative for bladder incontinence.   All other systems reviewed and are negative.        Filed Vitals:    06/11/12 1223 06/11/12 1310 06/11/12 1323 06/11/12 1408   BP: 151/83 153/101 137/80 132/74   Pulse: 95 86 81 80   Temp:       Resp: 18 16 16 18    Weight:       SpO2: 95% 96% 95% 99%            Physical Exam   Nursing note and vitals reviewed.  Constitutional: She is oriented to person, place, and time. She appears well-developed and well-nourished. No distress.   HENT:   Head: Normocephalic.   Right Ear: Tympanic membrane is erythematous. Tympanic membrane mobility is abnormal.   Left Ear: Tympanic membrane is erythematous. Tympanic membrane mobility is abnormal.   Nose: Mucosal edema and rhinorrhea present.   Mouth/Throat: Uvula is midline. Posterior oropharyngeal edema and posterior oropharyngeal erythema present.   Eyes: Conjunctivae and EOM are normal. Pupils are equal, round, and reactive to light. Right eye exhibits no discharge. Left eye exhibits no discharge. No scleral icterus.   Neck: Normal range of motion. Neck supple. No thyromegaly present.   Cardiovascular: Normal rate, regular rhythm, normal heart sounds and intact distal pulses.  Exam reveals no gallop and no friction rub.    No murmur heard.  Pulmonary/Chest: Effort normal and  breath sounds normal. She has no wheezes. She has no rales. She exhibits no tenderness.   Abdominal: Bowel sounds are normal. She exhibits no distension. There is no tenderness.   Musculoskeletal: Normal range of motion. She exhibits tenderness. She exhibits no edema.        Arms:  Neurological: She is alert and oriented to person, place, and time. She displays normal reflexes. No cranial nerve deficit. She exhibits normal muscle tone. Coordination normal.   Skin: She is not diaphoretic.        MDM    Procedures

## 2012-06-11 NOTE — ED Notes (Signed)
Warm blanket and pillow given to pt

## 2012-06-11 NOTE — ED Notes (Signed)
Pt states "doctor wanted port accessed and he is going to give her pain medication." materials distrubution was called for port access kit

## 2012-06-11 NOTE — ED Notes (Signed)
I have reviewed discharge instructions with the patient.  The patient verbalized understanding. 2 prescriptions given to pt. Pt taking bus home. Pt ambulatory. Pt not in any distress.

## 2012-06-11 NOTE — ED Notes (Signed)
Pt states "she got up to the bathroom and the pain came back up. The nausea is fine"

## 2012-06-11 NOTE — ED Notes (Signed)
Pt is back from CT. Iv fluid still running

## 2012-06-16 LAB — CBC WITH AUTOMATED DIFF
ABS. BASOPHILS: 0 10*3/uL (ref 0.0–0.2)
ABS. EOSINOPHILS: 0.3 10*3/uL (ref 0.0–0.8)
ABS. IMM. GRANS.: 0 10*3/uL (ref 0.0–0.5)
ABS. LYMPHOCYTES: 1.6 10*3/uL (ref 0.5–4.6)
ABS. MONOCYTES: 0.3 10*3/uL (ref 0.1–1.3)
ABS. NEUTROPHILS: 3.5 10*3/uL (ref 1.7–8.2)
BASOPHILS: 1 % (ref 0.0–2.0)
EOSINOPHILS: 5 % (ref 0.5–7.8)
HCT: 34 % — ABNORMAL LOW (ref 35.8–46.3)
HGB: 11.1 g/dL — ABNORMAL LOW (ref 11.7–15.4)
IMMATURE GRANULOCYTES: 0 % (ref 0.0–5.0)
LYMPHOCYTES: 28 % (ref 13–44)
MCH: 30.7 PG (ref 26.1–32.9)
MCHC: 32.6 g/dL (ref 31.4–35.0)
MCV: 93.9 FL (ref 79.6–97.8)
MONOCYTES: 6 % (ref 4.0–12.0)
MPV: 10.1 FL — ABNORMAL LOW (ref 10.8–14.1)
NEUTROPHILS: 60 % (ref 43–78)
PLATELET: 224 10*3/uL (ref 150–450)
RBC: 3.62 M/uL — ABNORMAL LOW (ref 4.05–5.25)
RDW: 13.7 % (ref 11.9–14.6)
WBC: 5.7 10*3/uL (ref 4.3–11.1)

## 2012-06-16 LAB — URINE MICROSCOPIC: WBC: >100 /hpf — ABNORMAL HIGH

## 2012-06-16 LAB — EKG, 12 LEAD, INITIAL
Atrial Rate: 100 {beats}/min
Calculated P Axis: 82 degrees
Calculated R Axis: 92 degrees
Calculated T Axis: 80 degrees
Diagnosis: NORMAL
P-R Interval: 152 ms
Q-T Interval: 348 ms
QRS Duration: 78 ms
QTC Calculation (Bezet): 448 ms
Ventricular Rate: 100 {beats}/min

## 2012-06-16 LAB — METABOLIC PANEL, COMPREHENSIVE
A-G Ratio: 1.1 — ABNORMAL LOW (ref 1.2–3.5)
ALT (SGPT): 27 U/L (ref 12–65)
AST (SGOT): 19 U/L (ref 15–37)
Albumin: 3.4 g/dL — ABNORMAL LOW (ref 3.5–5.0)
Alk. phosphatase: 109 U/L (ref 50–136)
Anion gap: 5 mmol/L — ABNORMAL LOW (ref 7–16)
BUN: 14 MG/DL (ref 6–23)
Bilirubin, total: 0.4 MG/DL (ref 0.2–1.1)
CO2: 26 mmol/L (ref 21–32)
Calcium: 8.8 MG/DL (ref 8.3–10.4)
Chloride: 111 mmol/L — ABNORMAL HIGH (ref 98–107)
Creatinine: 0.79 MG/DL (ref 0.6–1.0)
GFR est AA: >60 mL/min/{1.73_m2} (ref >60)
GFR est non-AA: >60 mL/min/{1.73_m2} (ref >60)
Globulin: 3 g/dL (ref 2.3–3.5)
Glucose: 123 mg/dL — ABNORMAL HIGH (ref 65–100)
Potassium: 3.7 mmol/L (ref 3.5–5.1)
Protein, total: 6.4 g/dL (ref 6.3–8.2)
Sodium: 142 mmol/L (ref 136–145)

## 2012-06-16 LAB — POC TROPONIN
Troponin-I (POC): 0 ng/ml (ref 0.0–0.08)
Troponin-I (POC): 0 ng/ml (ref 0.0–0.08)

## 2012-06-16 LAB — BNP: BNP: 7 pg/mL

## 2012-06-16 MED ADMIN — heparin (porcine) pf 100 unit/mL: @ 21:00:00

## 2012-06-16 MED ADMIN — oxyCODONE-acetaminophen (PERCOCET) 5-325 mg per tablet 2 Tab: ORAL | @ 18:00:00 | NDC 68084035511

## 2012-06-16 MED ADMIN — ondansetron (ZOFRAN ODT) tablet 4 mg: ORAL | @ 18:00:00 | NDC 68462015740

## 2012-06-16 MED ADMIN — HYDROmorphone (PF) (DILAUDID) injection 1 mg: INTRAVENOUS | @ 20:00:00 | NDC 00409255201

## 2012-06-16 NOTE — ED Provider Notes (Addendum)
Patient is a 47 y.o. female presenting with chest pain. [name]   Chest Pain (Angina)   This is a chronic problem. The current episode started more than 1 week ago. The problem has been gradually worsening. The problem occurs constantly. The pain is present in the right side. The pain is moderate. 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 ). Pertinent negatives include no diaphoresis, no nausea and no vomiting. Shortness of breath: same as usual breathing.        Past Medical History   Diagnosis Date   ??? 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 Factor V Leiden mutation    ??? Chronic kidney disease      kidney stones   ??? Neurological disorder      migraines   ??? Psychiatric disorder      depression   ??? Infectious disease 02/2009      Hx MRSA / E.Coli bacteremia, MRSA wound infection   ??? Thromboembolus      Factor 5, Factor 2   ??? Factor V deficiency    ??? Factor II deficiency         Past Surgical History   Procedure Laterality Date   ??? Cystoscopy  ???   ??? Lithotripsy  2006   ??? Hx tonsillectomy     ??? Hx urological       cystoscopex13   ??? Hx appendectomy  ???   ??? Hx cesarean section     ??? Hx tubal ligation     ??? Pr vascular surgery procedure unlist       Port/removal   ??? Hx vascular access  July, 2010     Power Port placed in [place], [place]., removed   ??? Hx vascular access       pt has current rt subclavian port         Family History   Problem Relation Age of Onset   ??? Cancer Father      died from colon ca.   ??? Cancer Maternal Grandmother      metastatic breast CA.        History     Social History   ??? Marital Status: DIVORCED     Spouse Name: N/A     Number of Children: N/A   ??? Years of Education: N/A     Occupational History   ??? Not on file.     Social History Main Topics   ??? Smoking status: Never Smoker    ??? Smokeless tobacco: Never Used   ???  Alcohol Use: Yes      Comment: rarely   ??? Drug Use: No      Comment: narcotic seeking per hospital hx.   ??? Sexually Active: Yes -- Female partner(s)     Birth Control/ Protection: Surgical     Other Topics Concern   ??? Not on file     Social History Narrative   ??? No narrative on file                  ALLERGIES: Ativan; Codeine; Influenza virus vaccines; Iodinated contrast media - iv dye; Ketorolac tromethamine; Morphine; Motrin; Pcn; Pneumococcal vaccine; Pneumovax 23; Prednisone; and Shellfish containing products      Review of Systems   Constitutional: Negative for  diaphoresis.   Respiratory: Shortness of breath: same as usual breathing.    Cardiovascular: Positive for chest pain.   Gastrointestinal: Negative for nausea and vomiting.   All other systems reviewed and are negative.        Filed Vitals:    06/16/12 1255   BP: 121/78   Pulse: 100   Temp: 98.1 ??F (36.7 ??C)   Resp: 16   Height: 5\' 4"  (1.626 m)   Weight: 64.864 kg (143 lb)   SpO2: 98%            Physical Exam   Nursing note and vitals reviewed.  Constitutional: She is oriented to person, place, and time. She appears well-developed and well-nourished. No distress.   HENT:   Head: Normocephalic and atraumatic.   Eyes: Conjunctivae are normal. No scleral icterus.   Neck: Normal range of motion. Neck supple.   Cardiovascular: Normal rate, regular rhythm and normal heart sounds.    Pulmonary/Chest: Breath sounds normal. No stridor. No respiratory distress. She has no wheezes. She has no rales.   Abdominal: Soft. There is no tenderness. There is no rebound and no guarding.   Neurological: She is alert and oriented to person, place, and time.   No focal weakness   Skin: Skin is warm and dry. No rash noted. She is not diaphoretic.   Psychiatric: She has a normal mood and affect. Her behavior is normal.        MDM     Differential Diagnosis; Clinical Impression; Plan:     Pt has chronic pain from pe's and extensive workup on this in the past.  Including multiple  cta's and v/q scan.  Given no respiratory distress and an oxygen saturation of 98% on RA I am not going repeat or change the therapeutic lovenox will treat symptomatically.      Standley Brooking, MD 4:00 PM  Pt has no evidence of strain on heart with normal bnp and troponin times two.  Pt will followup with hematologist and pcp  Amount and/or Complexity of Data Reviewed:   Clinical lab tests:  Ordered and reviewed (Results for orders placed during the hospital encounter of 06/16/12    -METABOLIC PANEL, COMPREHENSIVE     Sodium mmol/L                 142     Range: 136 - 145 mmol/L     Potassium mmol/L              3.7     Range: 3.5 - 5.1 mmol/L     Chloride mmol/L               111 (*) Range: 98 - 107 mmol/L     CO2 mmol/L                    26      Range: 21 - 32 mmol/L     Anion gap mmol/L              5 (*)   Range: 7 - 16 mmol/L     Glucose mg/dL                 161 (*) Range: 65 - 100 mg/dL     BUN MG/DL                     14      Range: 6 - 23 MG/DL  Creatinine MG/DL              1.61    Range: 0.6 - 1.0 MG/DL     GFR est AA WR/UEA/5.40J8      >60     Range: >60 ml/min/1.42m2     GFR est non-AA ml/min/1.68m2  >60     Range: >60 ml/min/1.54m2     Calcium MG/DL                 8.8     Range: 8.3 - 10.4 MG/DL     Bilirubin, total MG/DL        0.4     Range: 0.2 - 1.1 MG/DL     ALT U/L                       27      Range: 12 - 65 U/L     AST U/L                       19      Range: 15 - 37 U/L     Alk. phosphatase U/L          109     Range: 50 - 136 U/L     Protein, total g/dL           6.4     Range: 6.3 - 8.2 g/dL     Albumin g/dL                  3.4 (*) Range: 3.5 - 5.0 g/dL     Globulin g/dL                 3.0     Range: 2.3 - 3.5 g/dL     A-G Ratio                     1.1 (*) Range: 1.2 - 3.5      -POC TROPONIN-I     Troponin-I (POC) ng/ml        0       Range: 0.0 - 0.08 ng/ml )  Tests in the radiology section of CPT??:  Ordered and reviewed (No pneumothorax seen, Lungs clear with no infiltrates or  pleural effusions seen, normal cardiac silhouette. )   Independant visualization of image, tracing, or specimen:  Yes  Risk of Significant Complications, Morbidity, and/or Mortality:   Presenting problems:  High  Diagnostic procedures:  High  Management options:  High      Procedures

## 2012-06-16 NOTE — ED Notes (Signed)
I have reviewed discharge instructions with the patient.  The patient verbalized understanding.

## 2012-06-16 NOTE — ED Notes (Signed)
Pt going to x-ray... Checked pain and v/s

## 2012-06-23 LAB — CBC WITH AUTOMATED DIFF
ABS. BASOPHILS: 0 10*3/uL (ref 0.0–0.2)
ABS. EOSINOPHILS: 0.4 10*3/uL (ref 0.0–0.8)
ABS. IMM. GRANS.: 0 10*3/uL (ref 0.0–0.5)
ABS. LYMPHOCYTES: 2 10*3/uL (ref 0.5–4.6)
ABS. MONOCYTES: 0.4 10*3/uL (ref 0.1–1.3)
ABS. NEUTROPHILS: 1.7 10*3/uL (ref 1.7–8.2)
BASOPHILS: 1 % (ref 0.0–2.0)
EOSINOPHILS: 8 % — ABNORMAL HIGH (ref 0.5–7.8)
HCT: 34.5 % — ABNORMAL LOW (ref 35.8–46.3)
HGB: 11.6 g/dL — ABNORMAL LOW (ref 11.7–15.4)
IMMATURE GRANULOCYTES: 0.2 % (ref 0.0–5.0)
LYMPHOCYTES: 44 % (ref 13–44)
MCH: 31.4 PG (ref 26.1–32.9)
MCHC: 33.6 g/dL (ref 31.4–35.0)
MCV: 93.2 FL (ref 79.6–97.8)
MONOCYTES: 10 % (ref 4.0–12.0)
MPV: 10 FL — ABNORMAL LOW (ref 10.8–14.1)
NEUTROPHILS: 37 % — ABNORMAL LOW (ref 43–78)
PLATELET: 289 10*3/uL (ref 150–450)
RBC: 3.7 M/uL — ABNORMAL LOW (ref 4.05–5.25)
RDW: 13.2 % (ref 11.9–14.6)
WBC: 4.5 10*3/uL (ref 4.3–11.1)

## 2012-06-23 LAB — EKG, 12 LEAD, INITIAL
Atrial Rate: 80 {beats}/min
Calculated P Axis: 62 degrees
Calculated R Axis: 81 degrees
Calculated T Axis: 60 degrees
P-R Interval: 174 ms
Q-T Interval: 354 ms
QRS Duration: 90 ms
QTC Calculation (Bezet): 408 ms
Ventricular Rate: 80 {beats}/min

## 2012-06-23 LAB — URINE MICROSCOPIC
Casts: 0 /lpf
Mucus: 0 /lpf

## 2012-06-23 LAB — METABOLIC PANEL, BASIC
Anion gap: 7 mmol/L (ref 7–16)
BUN: 25 MG/DL — ABNORMAL HIGH (ref 6–23)
CO2: 26 mmol/L (ref 21–32)
Calcium: 8.9 MG/DL (ref 8.3–10.4)
Chloride: 104 mmol/L (ref 98–107)
Creatinine: 1.06 MG/DL — ABNORMAL HIGH (ref 0.6–1.0)
GFR est AA: >60 mL/min/{1.73_m2} (ref >60)
GFR est non-AA: 60 mL/min/{1.73_m2} — ABNORMAL LOW (ref >60)
Glucose: 80 mg/dL (ref 65–100)
Potassium: 4 mmol/L (ref 3.5–5.1)
Sodium: 137 mmol/L (ref 136–145)

## 2012-06-23 LAB — POC TROPONIN: Troponin-I (POC): 0 ng/ml (ref 0.0–0.08)

## 2012-06-23 MED ADMIN — morphine injection 4 mg: INTRAVENOUS | @ 13:00:00 | NDC 00409189101

## 2012-06-23 MED ADMIN — heparin (porcine) pf 300 Units: @ 15:00:00

## 2012-06-23 MED ADMIN — sodium chloride (NS) flush 5-10 mL: INTRAVENOUS | @ 13:00:00 | NDC 87701099893

## 2012-06-23 MED ADMIN — diphenhydrAMINE (BENADRYL) injection 25 mg: INTRAVENOUS | @ 13:00:00 | NDC 63323066401

## 2012-06-23 MED ADMIN — ondansetron (ZOFRAN) injection 4 mg: INTRAVENOUS | @ 13:00:00 | NDC 00409475503

## 2012-06-23 MED ADMIN — sodium chloride 0.9 % bolus infusion 1,000 mL: INTRAVENOUS | @ 13:00:00 | NDC 00409798309

## 2012-06-23 MED FILL — HEPARIN LOCK FLUSH (PORCINE) (PF) 100 UNIT/ML INTRAVENOUS SYRINGE: 100 unit/mL | INTRAVENOUS | Qty: 3

## 2012-06-23 MED FILL — DIPHENHYDRAMINE HCL 50 MG/ML IJ SOLN: 50 mg/mL | INTRAMUSCULAR | Qty: 1

## 2012-06-23 MED FILL — ONDANSETRON (PF) 4 MG/2 ML INJECTION: 4 mg/2 mL | INTRAMUSCULAR | Qty: 2

## 2012-06-23 MED FILL — MORPHINE 4 MG/ML SYRINGE: 4 mg/mL | INTRAMUSCULAR | Qty: 1

## 2012-06-23 NOTE — ED Notes (Signed)
Pt reports chest pain radiating to back.

## 2012-06-23 NOTE — ED Notes (Signed)
I have reviewed discharge instructions with the patient.  The patient verbalized understanding. Antibiotic medication instructions provided to patient. Patient verbalized understanding. PORT de accessed patient tolerated procedure. Patient states improved pain at this time. Patient ambulate from the department in no acute distress at this time.

## 2012-06-23 NOTE — ED Notes (Signed)
Patient returned from radiology. Patient placed back on monitors. NSR on monitor Even unlabored breathing on room air. NS Bolus infusing at this time.

## 2012-06-23 NOTE — ED Notes (Signed)
Patient sleeping on stretcher. Even unlabored breathing on room air. NSR on monitor. NS Bolus continues to infuse at this time.

## 2012-06-23 NOTE — ED Provider Notes (Signed)
HPI Comments: 73 yowf with h/o PEs and chronic CP returns to ED with continued pain. Was seen last week for same and states pain has not gone away. No fever or vomiting. No sob. Pain is sharp, right sided and radiates to back. Has had dark urine with some discomfort and odor for few days. No other complaints.    Patient is a 47 y.o. female presenting with chest pain. [name]   Chest Pain (Angina)   Associated symptoms include back pain. Pertinent negatives include no abdominal pain, no cough, no fever and no shortness of breath.        Past Medical History   Diagnosis Date   ??? 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 Factor V Leiden mutation    ??? Chronic kidney disease      kidney stones   ??? Neurological disorder      migraines   ??? Psychiatric disorder      depression   ??? Infectious disease 02/2009      Hx MRSA / E.Coli bacteremia, MRSA wound infection   ??? Thromboembolus      Factor 5, Factor 2   ??? Factor V deficiency    ??? Factor II deficiency         Past Surgical History   Procedure Laterality Date   ??? Cystoscopy  ???   ??? Lithotripsy  2006   ??? Hx tonsillectomy     ??? Hx urological       cystoscopex13   ??? Hx appendectomy  ???   ??? Hx cesarean section     ??? Hx tubal ligation     ??? Pr vascular surgery procedure unlist       Port/removal   ??? Hx vascular access  July, 2010     Power Port placed in [place], [place]., removed   ??? Hx vascular access       pt has current rt subclavian port         Family History   Problem Relation Age of Onset   ??? Cancer Father      died from colon ca.   ??? Cancer Maternal Grandmother      metastatic breast CA.        History     Social History   ??? Marital Status: DIVORCED     Spouse Name: N/A     Number of Children: N/A   ??? Years of Education: N/A     Occupational History   ??? Not on file.     Social History Main Topics   ??? Smoking status: Never Smoker    ??? Smokeless tobacco: Never Used   ??? Alcohol Use: Yes      Comment: rarely   ??? Drug Use: No       Comment: narcotic seeking per hospital hx.   ??? Sexually Active: Yes -- Female partner(s)     Birth Control/ Protection: Surgical     Other Topics Concern   ??? Not on file     Social History Narrative   ??? No narrative on file                  ALLERGIES: Ativan; Codeine; Influenza virus vaccines; Iodinated contrast media - iv dye; Ketorolac tromethamine; Morphine; Motrin; Pcn; Pneumococcal vaccine; Pneumovax 23; Prednisone; and Shellfish containing products      Review of Systems   Constitutional: Negative for fever.  HENT: Negative for neck pain.    Respiratory: Negative for cough and shortness of breath.    Cardiovascular: Positive for chest pain. Negative for leg swelling.   Gastrointestinal: Negative for abdominal pain.   Genitourinary: Negative for pelvic pain.   Musculoskeletal: Positive for back pain.   Skin: Negative for rash.   All other systems reviewed and are negative.        Filed Vitals:    06/23/12 0754 06/23/12 0814   BP: 128/60    Pulse: 92 80   Temp: 97.9 ??F (36.6 ??C)    Resp: 16 19   Height: 5\' 4"  (1.626 m)    Weight: 67.132 kg (148 lb)    SpO2: 97% 98%            Physical Exam   Nursing note and vitals reviewed.  Constitutional: She is oriented to person, place, and time. She appears well-developed and well-nourished. No distress.   HENT:   Head: Normocephalic and atraumatic.   Mouth/Throat: Oropharynx is clear and moist.   Eyes: Conjunctivae are normal. Pupils are equal, round, and reactive to light.   Neck: Normal range of motion. Neck supple. No JVD present.   Cardiovascular: Normal rate, regular rhythm and normal heart sounds.    No murmur heard.  Pulmonary/Chest: Effort normal and breath sounds normal. No respiratory distress. She has no wheezes.   Abdominal: Soft. Bowel sounds are normal. There is no tenderness.   Musculoskeletal: Normal range of motion. She exhibits no edema and no tenderness.   Neurological: She is alert and oriented to person, place, and time.   Skin: Skin is warm and dry.  No rash noted.   Psychiatric: She has a normal mood and affect.        MDM     Differential Diagnosis; Clinical Impression; Plan:     CP work up unremarkable. Pt doe shave UTI, but no findings to suggest pyelo. Will place on bactrim and ultram for pain.  Amount and/or Complexity of Data Reviewed:   Clinical lab tests:  Ordered and reviewed  Tests in the radiology section of CPT??:  Ordered and reviewed  Tests in the medicine section of the CPT??:  Ordered and reviewed  Risk of Significant Complications, Morbidity, and/or Mortality:   Presenting problems:  Moderate  Diagnostic procedures:  Low  Management options:  Low  Progress:   Patient progress:  Stable      Procedures

## 2012-06-28 LAB — CBC WITH AUTOMATED DIFF
ABS. BASOPHILS: 0 10*3/uL (ref 0.0–0.2)
ABS. EOSINOPHILS: 0.1 10*3/uL (ref 0.0–0.8)
ABS. IMM. GRANS.: 0 10*3/uL (ref 0.0–0.5)
ABS. LYMPHOCYTES: 1.3 10*3/uL (ref 0.5–4.6)
ABS. MONOCYTES: 0.2 10*3/uL (ref 0.1–1.3)
ABS. NEUTROPHILS: 2.4 10*3/uL (ref 1.7–8.2)
BASOPHILS: 1 % (ref 0.0–2.0)
EOSINOPHILS: 3 % (ref 0.5–7.8)
HCT: 35.7 % — ABNORMAL LOW (ref 35.8–46.3)
HGB: 12.1 g/dL (ref 11.7–15.4)
IMMATURE GRANULOCYTES: 0 % (ref 0.0–5.0)
LYMPHOCYTES: 32 % (ref 13–44)
MCH: 31.3 PG (ref 26.1–32.9)
MCHC: 33.9 g/dL (ref 31.4–35.0)
MCV: 92.5 FL (ref 79.6–97.8)
MONOCYTES: 5 % (ref 4.0–12.0)
MPV: 9.9 FL — ABNORMAL LOW (ref 10.8–14.1)
NEUTROPHILS: 59 % (ref 43–78)
PLATELET: 271 10*3/uL (ref 150–450)
RBC: 3.86 M/uL — ABNORMAL LOW (ref 4.05–5.25)
RDW: 13.2 % (ref 11.9–14.6)
WBC: 4 10*3/uL — ABNORMAL LOW (ref 4.3–11.1)

## 2012-06-28 LAB — METABOLIC PANEL, COMPREHENSIVE
A-G Ratio: 1.2 (ref 1.2–3.5)
ALT (SGPT): 25 U/L (ref 12–65)
AST (SGOT): 23 U/L (ref 15–37)
Albumin: 4.1 g/dL (ref 3.5–5.0)
Alk. phosphatase: 160 U/L — ABNORMAL HIGH (ref 50–136)
Anion gap: 7 mmol/L (ref 7–16)
BUN: 15 MG/DL (ref 6–23)
Bilirubin, total: 0.3 MG/DL (ref 0.2–1.1)
CO2: 22 mmol/L (ref 21–32)
Calcium: 9 MG/DL (ref 8.3–10.4)
Chloride: 105 mmol/L (ref 98–107)
Creatinine: 0.95 MG/DL (ref 0.6–1.0)
GFR est AA: >60 mL/min/{1.73_m2} (ref >60)
GFR est non-AA: >60 mL/min/{1.73_m2} (ref >60)
Globulin: 3.5 g/dL (ref 2.3–3.5)
Glucose: 90 mg/dL (ref 65–100)
Potassium: 4.2 mmol/L (ref 3.5–5.1)
Protein, total: 7.6 g/dL (ref 6.3–8.2)
Sodium: 134 mmol/L — ABNORMAL LOW (ref 136–145)

## 2012-06-28 LAB — LIPASE: Lipase: 93 U/L (ref 73–393)

## 2012-06-28 LAB — AMYLASE: Amylase: 33 U/L (ref 25–115)

## 2012-06-28 LAB — C REACTIVE PROTEIN, QT: C-Reactive protein: 0.6 mg/dL (ref 0.0–0.9)

## 2012-06-28 MED ADMIN — sodium chloride 0.9 % bolus infusion 1,000 mL: INTRAVENOUS | @ 22:00:00 | NDC 00409798309

## 2012-06-28 MED ADMIN — hyoscyamine SL (LEVSIN/SL) tablet 0.125 mg: SUBLINGUAL | @ 22:00:00 | NDC 76439030910

## 2012-06-28 MED ADMIN — ondansetron (ZOFRAN) injection 4 mg: INTRAVENOUS | @ 22:00:00 | NDC 00409475503

## 2012-06-28 MED ADMIN — HYDROmorphone (PF) (DILAUDID) injection 1 mg: INTRAVENOUS | @ 22:00:00 | NDC 00409255201

## 2012-06-28 MED FILL — HYDROMORPHONE (PF) 1 MG/ML IJ SOLN: 1 mg/mL | INTRAMUSCULAR | Qty: 1

## 2012-06-28 MED FILL — HYOMAX-SL 0.125 MG SUBLINGUAL TABLET: 0.125 mg | SUBLINGUAL | Qty: 1

## 2012-06-28 MED FILL — ONDANSETRON (PF) 4 MG/2 ML INJECTION: 4 mg/2 mL | INTRAMUSCULAR | Qty: 2

## 2012-06-28 NOTE — ED Notes (Signed)
I have reviewed discharge instructions with the patient.  The patient verbalized understanding.  3 rx given. Pt ambulatory to waiting room for personal vehicle.

## 2012-06-28 NOTE — ED Provider Notes (Signed)
HPI Comments: Pt reports onset yesterday of RUQ pain that is squeezing in nature and radiating to the back.  She has had vomiting associated with it. Review of records indicate a history of chronic pain syndrome.    Patient is a 47 y.o. female presenting with abdominal pain. [name]   Abdominal Pain   This is a new problem. The current episode started yesterday. The problem occurs constantly. The problem has not changed since onset.The pain is associated with vomiting. The pain is located in the RUQ. The quality of the pain is cramping. The pain is at a severity of 8/10. The pain is severe. Associated symptoms include nausea, vomiting and back pain. Pertinent negatives include no anorexia, no fever, no belching, no diarrhea, no flatus, no hematochezia, no melena, no constipation, no dysuria, no frequency, no hematuria, no headaches, no arthralgias, no myalgias and no chest pain. The pain is worsened by deep breathing and certain positions. The pain is relieved by nothing. Past workup includes CT scan and ultrasound. Past workup comments: had CT urogram 2 weeks ago that was positive for a right renal stone, but no ureteral stones.  Previous US has no mention of stones. . Her past medical history is significant for kidney stones. Her past medical history does not include gallstones. The patient's surgical history non-contributory. IVC filter       Past Medical History   Diagnosis Date   ??? 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 Factor V Leiden mutation    ??? Chronic kidney disease      kidney stones   ??? Neurological disorder      migraines   ??? Psychiatric disorder      depression   ??? Infectious disease 02/2009      Hx MRSA / E.Coli bacteremia, MRSA wound infection   ??? Thromboembolus      Factor 5, Factor 2   ??? Factor V deficiency    ??? Factor II deficiency         Past Surgical History   Procedure Laterality Date   ??? Cystoscopy  ???   ??? Lithotripsy  2006   ??? Hx  tonsillectomy     ??? Hx urological       cystoscopex13   ??? Hx appendectomy  ???   ??? Hx cesarean section     ??? Hx tubal ligation     ??? Pr vascular surgery procedure unlist       Port/removal   ??? Hx vascular access  July, 2010     Power Port placed in [place], [place]., removed   ??? Hx vascular access       pt has current rt subclavian port         Family History   Problem Relation Age of Onset   ??? Cancer Father      died from colon ca.   ??? Cancer Maternal Grandmother      metastatic breast CA.        History     Social History   ??? Marital Status: DIVORCED     Spouse Name: N/A     Number of Children: N/A   ??? Years of Education: N/A     Occupational History   ??? Not on file.     Social History Main Topics   ??? Smoking status: Never Smoker    ??? Smokeless tobacco: Never Used   ??? Alcohol  Use: Yes      Comment: rarely   ??? Drug Use: No      Comment: narcotic seeking per hospital hx.   ??? Sexually Active: Yes -- Female partner(s)     Birth Control/ Protection: Surgical     Other Topics Concern   ??? Not on file     Social History Narrative   ??? No narrative on file                  ALLERGIES: Ativan; Codeine; Influenza virus vaccines; Iodinated contrast media - iv dye; Ketorolac tromethamine; Morphine; Motrin; Pcn; Pneumococcal vaccine; Pneumovax 23; Prednisone; and Shellfish containing products      Review of Systems   Constitutional: Negative for fever.   Cardiovascular: Negative for chest pain.   Gastrointestinal: Positive for nausea, vomiting and abdominal pain. Negative for diarrhea, constipation, melena, hematochezia, anorexia and flatus.   Genitourinary: Negative for dysuria, frequency and hematuria.   Musculoskeletal: Positive for back pain. Negative for myalgias and arthralgias.   Neurological: Negative for headaches.   All other systems reviewed and are negative.        Filed Vitals:    06/28/12 1222   BP: 121/76   Pulse: 98   Temp: 98.1 ??F (36.7 ??C)   Resp: 18   Height: 5\' 4"  (1.626 m)   Weight: 64.411 kg (142 lb)   SpO2: 97%             Physical Exam   Nursing note and vitals reviewed.  Constitutional: She is oriented to person, place, and time. She appears well-developed and well-nourished. No distress.   HENT:   Head: Normocephalic and atraumatic.   Eyes: Conjunctivae and EOM are normal. Pupils are equal, round, and reactive to light.   Neck: Normal range of motion. Neck supple.   Cardiovascular: Normal rate, regular rhythm, normal heart sounds and intact distal pulses.    Pulmonary/Chest: Effort normal and breath sounds normal.   Abdominal: Soft. Bowel sounds are normal. She exhibits no distension and no mass. There is tenderness. There is guarding. There is no rebound.   RUQ tenderness   Musculoskeletal: Normal range of motion. She exhibits no edema and no tenderness.   Neurological: She is alert and oriented to person, place, and time.   Skin: Skin is warm and dry.   Psychiatric: She has a normal mood and affect. Her behavior is normal.        MDM     Amount and/or Complexity of Data Reviewed:   Clinical lab tests:  Ordered and reviewed  Tests in the radiology section of CPT??:  Ordered and reviewed   Independant visualization of image, tracing, or specimen:  Yes  Risk of Significant Complications, Morbidity, and/or Mortality:   Presenting problems:  High  Diagnostic procedures:  High  Management options:  Moderate  Progress:   Patient progress:  Stable      Procedures

## 2012-06-28 NOTE — ED Notes (Signed)
Patient requests to use port for blood draws.

## 2012-06-28 NOTE — ED Notes (Signed)
Reports knot to RUQ of abdomen.  Reports vomited green x 2 and has been unable to eat.

## 2012-06-28 NOTE — ED Notes (Signed)
Results reviewed and discussed with Pt.  Will treat as biliary colic and refer to general surgery for follow up.

## 2012-06-29 MED ADMIN — HYDROcodone-acetaminophen (NORCO) 7.5-325 mg per tablet 1 Tab: ORAL | NDC 68084060111

## 2012-06-29 MED ADMIN — heparin (porcine) pf 100 unit/mL

## 2012-06-29 MED FILL — HYDROCODONE-ACETAMINOPHEN 7.5 MG-325 MG TAB: ORAL | Qty: 1

## 2012-06-29 MED FILL — HEPARIN LOCK FLUSH (PORCINE) (PF) 100 UNIT/ML INTRAVENOUS SYRINGE: 100 unit/mL | INTRAVENOUS | Qty: 3

## 2012-07-16 MED ADMIN — dexamethasone (DECADRON) injection 10 mg: INTRAVENOUS | NDC 00641036721

## 2012-07-16 MED ADMIN — metoclopramide HCl (REGLAN) injection 10 mg: INTRAVENOUS | NDC 00409341401

## 2012-07-16 MED ADMIN — diphenhydrAMINE (BENADRYL) injection 50 mg: INTRAVENOUS | NDC 63323066401

## 2012-07-16 MED FILL — METOCLOPRAMIDE 5 MG/ML IJ SOLN: 5 mg/mL | INTRAMUSCULAR | Qty: 2

## 2012-07-16 MED FILL — DEXAMETHASONE SODIUM PHOSPHATE 10 MG/ML IJ SOLN: 10 mg/mL | INTRAMUSCULAR | Qty: 1

## 2012-07-16 MED FILL — DIPHENHYDRAMINE HCL 50 MG/ML IJ SOLN: 50 mg/mL | INTRAMUSCULAR | Qty: 1

## 2012-07-16 NOTE — ED Provider Notes (Addendum)
Patient is a 47 y.o. female presenting with headaches. [name]   Headache   This is a new problem. The current episode started 6 to 12 hours ago. The problem occurs constantly. The problem has not changed since onset.The headache is aggravated by bright light, photophobia and nausea. The pain is located in the left unilateral region. The quality of the pain is described as throbbing. The pain is severe. Associated symptoms include visual change and nausea. Pertinent negatives include no fever, no syncope, no shortness of breath, no weakness, no dizziness and no vomiting. She has tried triptan therapy for the symptoms.        Past Medical History   Diagnosis Date   ??? 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 Factor V Leiden mutation    ??? Chronic kidney disease      kidney stones   ??? Neurological disorder      migraines   ??? Psychiatric disorder      depression   ??? Infectious disease 02/2009      Hx MRSA / E.Coli bacteremia, MRSA wound infection   ??? Thromboembolus      Factor 5, Factor 2   ??? Factor V deficiency    ??? Factor II deficiency         Past Surgical History   Procedure Laterality Date   ??? Cystoscopy  ???   ??? Lithotripsy  2006   ??? Hx tonsillectomy     ??? Hx urological       cystoscopex13   ??? Hx appendectomy  ???   ??? Hx cesarean section     ??? Hx tubal ligation     ??? Pr vascular surgery procedure unlist       Port/removal   ??? Hx vascular access  July, 2010     Power Port placed in [place], [place]., removed   ??? Hx vascular access       pt has current rt subclavian port         Family History   Problem Relation Age of Onset   ??? Cancer Father      died from colon ca.   ??? Cancer Maternal Grandmother      metastatic breast CA.        History     Social History   ??? Marital Status: DIVORCED     Spouse Name: N/A     Number of Children: N/A   ??? Years of Education: N/A     Occupational History   ??? Not on file.     Social History Main Topics   ??? Smoking status: Never Smoker    ???  Smokeless tobacco: Never Used   ??? Alcohol Use: Yes      Comment: rarely   ??? Drug Use: No      Comment: narcotic seeking per hospital hx.   ??? Sexually Active: Yes -- Female partner(s)     Birth Control/ Protection: Surgical     Other Topics Concern   ??? Not on file     Social History Narrative   ??? No narrative on file                  ALLERGIES: Ativan; Codeine; Influenza virus vaccines; Iodinated contrast media - iv dye; Ketorolac tromethamine; Morphine; Motrin; Pcn; Pneumococcal vaccine; Pneumovax 23; Prednisone; and Shellfish containing products      Review of Systems   Constitutional:  Negative for fever and chills.   HENT: Negative for congestion, sore throat and rhinorrhea.    Eyes: Positive for photophobia and visual disturbance.   Respiratory: Negative for cough and shortness of breath.    Cardiovascular: Negative for syncope.   Gastrointestinal: Positive for nausea. Negative for vomiting.   Genitourinary: Negative for dysuria, urgency and frequency.   Musculoskeletal: Negative for myalgias and back pain.   Neurological: Positive for headaches. Negative for dizziness, weakness and numbness.       Filed Vitals:    07/16/12 1917   BP: 152/68   Pulse: 88   Temp: 97.3 ??F (36.3 ??C)   Resp: 20   Height: 5\' 4"  (1.626 m)   Weight: 64.411 kg (142 lb)   SpO2: 97%            Physical Exam   Nursing note and vitals reviewed.  Constitutional: She is oriented to person, place, and time. She appears well-developed and well-nourished.   Eyes: Conjunctivae are normal. Pupils are equal, round, and reactive to light.   Neck: Normal range of motion. Neck supple.   No meningmus   Cardiovascular: Normal rate, regular rhythm and normal heart sounds.    Pulmonary/Chest: Effort normal and breath sounds normal.   Abdominal: Soft. Bowel sounds are normal. She exhibits no distension. There is no tenderness. There is no rebound and no guarding.   Musculoskeletal: Normal range of motion. She exhibits no edema.   Neurological: She is alert and  oriented to person, place, and time. She has normal strength. No cranial nerve deficit or sensory deficit. GCS eye subscore is 4. GCS verbal subscore is 5. GCS motor subscore is 6.   Skin: Skin is warm and dry.        MDM    Procedures

## 2012-07-16 NOTE — ED Notes (Signed)
I have reviewed discharge instructions with the patient and Johnny her caregiver.  The patient and caregiver verbalized understanding.

## 2012-07-16 NOTE — ED Notes (Signed)
Pt states IV decadron causes an intense burning in the groin.

## 2012-07-16 NOTE — ED Notes (Signed)
Reports migraine headache onset 0400 this morning, self treated with Imitrex about 1700 without relief. Reports light sensitivity.

## 2012-07-17 MED ADMIN — HYDROmorphone (PF) (DILAUDID) injection 1 mg: INTRAVENOUS | @ 01:00:00 | NDC 00409255201

## 2012-07-17 MED ADMIN — sodium chloride (NS) flush 5-10 mL: INTRAVENOUS | @ 01:00:00 | NDC 87701099893

## 2012-07-17 MED ADMIN — heparin (porcine) pf 300 Units: @ 03:00:00

## 2012-07-17 MED ADMIN — ondansetron (ZOFRAN) injection 4 mg: INTRAVENOUS | @ 01:00:00 | NDC 00409475503

## 2012-07-17 MED FILL — ONDANSETRON (PF) 4 MG/2 ML INJECTION: 4 mg/2 mL | INTRAMUSCULAR | Qty: 2

## 2012-07-17 MED FILL — HYDROMORPHONE (PF) 1 MG/ML IJ SOLN: 1 mg/mL | INTRAMUSCULAR | Qty: 1

## 2012-07-17 MED FILL — HEPARIN LOCK FLUSH (PORCINE) (PF) 100 UNIT/ML INTRAVENOUS SYRINGE: 100 unit/mL | INTRAVENOUS | Qty: 3

## 2012-07-18 LAB — D-DIMER, QUANTITATIVE: D-Dimer, Quant: <0.19 ug/ml(FEU) (ref <0.55)

## 2012-07-18 LAB — HCG URINE, QL. - POC: Pregnancy test,urine (POC): NEGATIVE

## 2012-07-18 LAB — DRUG SCREEN, URINE
AMPHETAMINES: NEGATIVE
BARBITURATES: NEGATIVE
BENZODIAZEPINES: POSITIVE
COCAINE: NEGATIVE
METHADONE: NEGATIVE
OPIATES: POSITIVE
PCP(PHENCYCLIDINE): NEGATIVE
THC (TH-CANNABINOL): NEGATIVE

## 2012-07-18 LAB — METABOLIC PANEL, BASIC
Anion gap: 8 mmol/L (ref 7–16)
BUN: 27 MG/DL — ABNORMAL HIGH (ref 6–23)
CO2: 24 mmol/L (ref 21–32)
Calcium: 9 MG/DL (ref 8.3–10.4)
Chloride: 106 mmol/L (ref 98–107)
Creatinine: 1.11 MG/DL — ABNORMAL HIGH (ref 0.6–1.0)
GFR est AA: >60 mL/min/{1.73_m2} (ref >60)
GFR est non-AA: 57 mL/min/{1.73_m2} — ABNORMAL LOW (ref >60)
Glucose: 100 mg/dL (ref 65–100)
Potassium: 3.7 mmol/L (ref 3.5–5.1)
Sodium: 138 mmol/L (ref 136–145)

## 2012-07-18 LAB — CBC WITH AUTOMATED DIFF
ABS. BASOPHILS: 0 10*3/uL (ref 0.0–0.2)
ABS. EOSINOPHILS: 0 10*3/uL (ref 0.0–0.8)
ABS. IMM. GRANS.: 0 10*3/uL (ref 0.0–0.5)
ABS. LYMPHOCYTES: 2 10*3/uL (ref 0.5–4.6)
ABS. MONOCYTES: 0.9 10*3/uL (ref 0.1–1.3)
ABS. NEUTROPHILS: 6.8 10*3/uL (ref 1.7–8.2)
BASOPHILS: 0 % (ref 0.0–2.0)
EOSINOPHILS: 0 % — ABNORMAL LOW (ref 0.5–7.8)
HCT: 32 % — ABNORMAL LOW (ref 35.8–46.3)
HGB: 10.7 g/dL — ABNORMAL LOW (ref 11.7–15.4)
IMMATURE GRANULOCYTES: 0 % (ref 0.0–5.0)
LYMPHOCYTES: 21 % (ref 13–44)
MCH: 31 PG (ref 26.1–32.9)
MCHC: 33.4 g/dL (ref 31.4–35.0)
MCV: 92.8 FL (ref 79.6–97.8)
MONOCYTES: 9 % (ref 4.0–12.0)
MPV: 10 FL — ABNORMAL LOW (ref 10.8–14.1)
NEUTROPHILS: 70 % (ref 43–78)
PLATELET COMMENTS: ADEQUATE
PLATELET: 255 10*3/uL (ref 150–450)
RBC: 3.45 M/uL — ABNORMAL LOW (ref 4.05–5.25)
RDW: 13.4 % (ref 11.9–14.6)
WBC: 9.7 10*3/uL (ref 4.3–11.1)

## 2012-07-18 LAB — PROTHROMBIN TIME + INR
INR: 4.4 — CR (ref 0.9–1.2)
Prothrombin time: 49.7 s — ABNORMAL HIGH (ref 9.6–11.6)

## 2012-07-18 LAB — D DIMER: D DIMER: <0.19 ug/ml(FEU) (ref <0.55)

## 2012-07-18 LAB — TROPONIN I: Troponin-I, Qt.: <0.02 NG/ML — ABNORMAL LOW (ref 0.02–0.05)

## 2012-07-18 MED ADMIN — sodium chloride 0.9 % bolus infusion 1,000 mL: INTRAVENOUS | @ 07:00:00 | NDC 00409798309

## 2012-07-18 MED ADMIN — ondansetron (ZOFRAN) injection 4 mg: INTRAVENOUS | @ 08:00:00 | NDC 00409475503

## 2012-07-18 MED ADMIN — HYDROmorphone (PF) (DILAUDID) injection 1 mg: INTRAVENOUS | @ 08:00:00 | NDC 00409255201

## 2012-07-18 MED FILL — ONDANSETRON (PF) 4 MG/2 ML INJECTION: 4 mg/2 mL | INTRAMUSCULAR | Qty: 2

## 2012-07-18 MED FILL — HYDROMORPHONE (PF) 1 MG/ML IJ SOLN: 1 mg/mL | INTRAMUSCULAR | Qty: 1

## 2012-07-18 NOTE — ED Provider Notes (Signed)
HPI Comments: Pt. With factor 5 leiden deficiency. Prior PE's. Here for pain in left chest similar to prior pain. NO change in SOB. Stopped lovenox Friday and started coumadin Friday. Also with HA.     Patient is a 47 y.o. female presenting with chest pain. [name] No language interpreter was used.   Chest Pain (Angina)   This is a new problem. The current episode started 6 to 12 hours ago. The problem has not changed since onset.The problem occurs constantly. The pain is associated with normal activity. The pain is present in the left side. The pain is moderate. The quality of the pain is described as sharp. The pain does not radiate. Associated symptoms include headaches and nausea. Pertinent negatives include no abdominal pain, no back pain, no cough, no diaphoresis, no dizziness, no exertional chest pressure, no fever, no irregular heartbeat, no leg pain, no lower extremity edema, no malaise/fatigue, no numbness, no shortness of breath, no vomiting and no weakness. She has tried OTC pain medications for the symptoms. The treatment provided no relief. Her past medical history is significant for PE.       Past Medical History   Diagnosis Date   ??? 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 Factor V Leiden mutation    ??? Chronic kidney disease      kidney stones   ??? Neurological disorder      migraines   ??? Psychiatric disorder      depression   ??? Infectious disease 02/2009      Hx MRSA / E.Coli bacteremia, MRSA wound infection   ??? Thromboembolus      Factor 5, Factor 2   ??? Factor V deficiency    ??? Factor II deficiency         Past Surgical History   Procedure Laterality Date   ??? Cystoscopy  ???   ??? Lithotripsy  2006   ??? Hx tonsillectomy     ??? Hx urological       cystoscopex13   ??? Hx appendectomy  ???   ??? Hx cesarean section     ??? Hx tubal ligation     ??? Pr vascular surgery procedure unlist       Port/removal   ??? Hx vascular access  July, 2010     Power Port placed in  [place], [place]., removed   ??? Hx vascular access       pt has current rt subclavian port         Family History   Problem Relation Age of Onset   ??? Cancer Father      died from colon ca.   ??? Cancer Maternal Grandmother      metastatic breast CA.        History     Social History   ??? Marital Status: DIVORCED     Spouse Name: N/A     Number of Children: N/A   ??? Years of Education: N/A     Occupational History   ??? Not on file.     Social History Main Topics   ??? Smoking status: Never Smoker    ??? Smokeless tobacco: Never Used   ??? Alcohol Use: Yes      Comment: rarely   ??? Drug Use: No      Comment: narcotic seeking per hospital hx.   ??? Sexually Active: Yes -- Female partner(s)  Birth Control/ Protection: Surgical     Other Topics Concern   ??? Not on file     Social History Narrative   ??? No narrative on file                  ALLERGIES: Ativan; Codeine; Influenza virus vaccines; Iodinated contrast media - iv dye; Ketorolac tromethamine; Morphine; Motrin; Pcn; Pneumococcal vaccine; Pneumovax 23; Prednisone; and Shellfish containing products      Review of Systems   Constitutional: Negative for fever, chills, malaise/fatigue and diaphoresis.   HENT: Negative for neck pain and neck stiffness.    Eyes: Negative for pain and redness.   Respiratory: Negative for cough, chest tightness, shortness of breath and wheezing.    Cardiovascular: Positive for chest pain. Negative for leg swelling.   Gastrointestinal: Positive for nausea. Negative for vomiting, abdominal pain and diarrhea.   Genitourinary: Negative for dysuria and hematuria.   Musculoskeletal: Negative for back pain and gait problem.   Skin: Negative for color change and rash.   Neurological: Positive for headaches. Negative for dizziness, weakness, light-headedness and numbness.       Filed Vitals:    07/18/12 0143   BP: 138/82   Pulse: 83   Temp: 98.9 ??F (37.2 ??C)   Resp: 20   Height: 5\' 4"  (1.626 m)   Weight: 64.411 kg (142 lb)   SpO2: 95%            Physical Exam    Nursing note and vitals reviewed.  Constitutional: She is oriented to person, place, and time. She appears well-developed and well-nourished.   HENT:   Head: Normocephalic and atraumatic.   Eyes: Conjunctivae and EOM are normal. Pupils are equal, round, and reactive to light.   Neck: Normal range of motion. Neck supple.   Cardiovascular: Normal rate and regular rhythm.    No murmur heard.  Pulmonary/Chest: Effort normal and breath sounds normal. She has no wheezes. She exhibits tenderness (mild left chest wall).   Abdominal: Soft. Bowel sounds are normal. There is no tenderness.   Musculoskeletal: Normal range of motion. She exhibits no edema.   Neurological: She is alert and oriented to person, place, and time. No cranial nerve deficit. She exhibits normal muscle tone. Coordination normal.   Skin: Skin is warm and dry.        MDM     Risk of Significant Complications, Morbidity, and/or Mortality:   Presenting problems:  Moderate  Diagnostic procedures:  Moderate  Management options:  Moderate  Progress:   Patient progress:  Stable      Procedures    EKG: normal sinus rhythm, nonspecific ST and T waves changes. Rate 74.    XR CHEST PA LAT (Final result)  Result time: 07/18/12 03:22:48      Final result by Morene Crocker, MD (07/18/12 03:22:48)      Impression:    Impression: No acute abnormality.      Narrative:    Chest PA and lateral views    Indication: Chest pain    Comparison: 06/23/2012    Findings: Right-sided chest Port-A-Cath is unchanged. The heart is normal in  size. The lungs are clear. No focal infiltrate, effusion, or acute gross  skeletal abnormality. There is an inferior vena cava filter.                  Results Include:    Recent Results (from the past 24 hour(s))   CBC WITH AUTOMATED DIFF  Collection Time     07/18/12  2:45 AM       Result Value Range    WBC 9.7  4.3 - 11.1 K/uL    RBC 3.45 (*) 4.05 - 5.25 M/uL    HGB 10.7 (*) 11.7 - 15.4 g/dL    HCT 09.8 (*) 11.9 - 46.3 %    MCV 92.8  79.6  - 97.8 FL    MCH 31.0  26.1 - 32.9 PG    MCHC 33.4  31.4 - 35.0 g/dL    RDW 14.7  82.9 - 56.2 %    PLATELET 255  150 - 450 K/uL    MPV 10.0 (*) 10.8 - 14.1 FL    NEUTROPHILS 70  43 - 78 %    LYMPHOCYTES 21  13 - 44 %    MONOCYTES 9  4.0 - 12.0 %    EOSINOPHILS 0 (*) 0.5 - 7.8 %    BASOPHILS 0  0.0 - 2.0 %    IMMATURE GRANULOCYTES 0  0.0 - 5.0 %    ABS. NEUTROPHILS 6.8  1.7 - 8.2 K/UL    ABS. LYMPHOCYTES 2.0  0.5 - 4.6 K/UL    ABS. MONOCYTES 0.9  0.1 - 1.3 K/UL    ABS. EOSINOPHILS 0.0  0.0 - 0.8 K/UL    ABS. BASOPHILS 0.0  0.0 - 0.2 K/UL    ABS. IMM. GRANS. 0.0  0.0 - 0.5 K/UL    RBC COMMENTS NORMOCYTIC/NORMOCHROMIC      WBC COMMENTS Result Confirmed By Smear      PLATELET COMMENTS ADEQUATE      DF AUTOMATED     METABOLIC PANEL, BASIC    Collection Time     07/18/12  2:45 AM       Result Value Range    Sodium 138  136 - 145 mmol/L    Potassium 3.7  3.5 - 5.1 mmol/L    Chloride 106  98 - 107 mmol/L    CO2 24  21 - 32 mmol/L    Anion gap 8  7 - 16 mmol/L    Glucose 100  65 - 100 mg/dL    BUN 27 (*) 6 - 23 MG/DL    Creatinine 1.30 (*) 0.6 - 1.0 MG/DL    GFR est AA >86  >57 ml/min/1.5m2    GFR est non-AA 57 (*) >60 ml/min/1.47m2    Calcium 9.0  8.3 - 10.4 MG/DL   PROTHROMBIN TIME + INR    Collection Time     07/18/12  2:45 AM       Result Value Range    Prothrombin time 49.7 (*) 9.6 - 11.6 sec    INR 4.4 (*) 0.9 - 1.2     D DIMER    Collection Time     07/18/12  2:45 AM       Result Value Range    D DIMER <0.19  <0.55 ug/ml(FEU)     UA negative. UPT negative.             TROPONIN I (Final result) Abnormal Component (Lab Inquiry)       Result Time TROIP     07/18/12 03:59:20 <0.02 (L)  "A cutoff of >0.60 NG/ML is suggested as being consistent with the WHO criteria for AMI. Values ranging from 0.06 to 0.59 represent an indeterminant/grey zone for injury due to myocarditis, ischemia, trauma, etc. Clinical correlation is necessary to determine the significance of the presence of Troponin cTnI. Sequential  testing is  recommended. Cardiac Troponin-I has a relatively long half-life and may be present well after the CK MB has returned to baseline. Values ranging from 0.00 to 0.05 NG/ML represents 97.5 percentile ranking of individuals from a test population that demonstrates a healthy clinical picture."

## 2012-07-18 NOTE — ED Notes (Signed)
Seen last night at ES for migraine.  States head still hurts, but also now c/o left upper chest and shoulder pain.  States feels like when she has had blood clots in the past.  Currently taking coumadin.

## 2012-07-18 NOTE — ED Notes (Signed)
Patient sleeping.  Respirations even and unlabored.  Appears pain free.

## 2012-07-19 LAB — EKG, 12 LEAD, INITIAL
Atrial Rate: 74 {beats}/min
Calculated P Axis: 70 degrees
Calculated R Axis: 82 degrees
Calculated T Axis: 60 degrees
P-R Interval: 174 ms
Q-T Interval: 364 ms
QRS Duration: 94 ms
QTC Calculation (Bezet): 404 ms
Ventricular Rate: 74 {beats}/min

## 2012-08-11 LAB — CBC WITH AUTOMATED DIFF
ABS. BASOPHILS: 0 10*3/uL (ref 0.0–0.2)
ABS. EOSINOPHILS: 0.1 10*3/uL (ref 0.0–0.8)
ABS. IMM. GRANS.: 0 10*3/uL (ref 0.0–0.5)
ABS. LYMPHOCYTES: 1.5 10*3/uL (ref 0.5–4.6)
ABS. MONOCYTES: 0.4 10*3/uL (ref 0.1–1.3)
ABS. NEUTROPHILS: 2.8 10*3/uL (ref 1.7–8.2)
BASOPHILS: 0 % (ref 0.0–2.0)
EOSINOPHILS: 3 % (ref 0.5–7.8)
HCT: 34.9 % — ABNORMAL LOW (ref 35.8–46.3)
HGB: 11.7 g/dL (ref 11.7–15.4)
IMMATURE GRANULOCYTES: 0.2 % (ref 0.0–5.0)
LYMPHOCYTES: 31 % (ref 13–44)
MCH: 30.5 PG (ref 26.1–32.9)
MCHC: 33.5 g/dL (ref 31.4–35.0)
MCV: 90.9 FL (ref 79.6–97.8)
MONOCYTES: 7 % (ref 4.0–12.0)
MPV: 9.6 FL — ABNORMAL LOW (ref 10.8–14.1)
NEUTROPHILS: 59 % (ref 43–78)
PLATELET: 387 10*3/uL (ref 150–450)
RBC: 3.84 M/uL — ABNORMAL LOW (ref 4.05–5.25)
RDW: 13.4 % (ref 11.9–14.6)
WBC: 4.8 10*3/uL (ref 4.3–11.1)

## 2012-08-11 LAB — URINE MICROSCOPIC
Casts: 0 /lpf
Crystals, urine: 0 /LPF
Mucus: 0 /lpf
RBC: >100 /hpf

## 2012-08-11 LAB — METABOLIC PANEL, COMPREHENSIVE
A-G Ratio: 1.1 — ABNORMAL LOW (ref 1.2–3.5)
ALT (SGPT): 31 U/L (ref 12–65)
AST (SGOT): 20 U/L (ref 15–37)
Albumin: 4.2 g/dL (ref 3.5–5.0)
Alk. phosphatase: 174 U/L — ABNORMAL HIGH (ref 50–136)
Anion gap: 9 mmol/L (ref 7–16)
BUN: 20 MG/DL (ref 6–23)
Bilirubin, total: 0.5 MG/DL (ref 0.2–1.1)
CO2: 23 mmol/L (ref 21–32)
Calcium: 9.5 MG/DL (ref 8.3–10.4)
Chloride: 105 mmol/L (ref 98–107)
Creatinine: 0.98 MG/DL (ref 0.6–1.0)
GFR est AA: >60 mL/min/{1.73_m2} (ref >60)
GFR est non-AA: >60 mL/min/{1.73_m2} (ref >60)
Globulin: 3.9 g/dL — ABNORMAL HIGH (ref 2.3–3.5)
Glucose: 100 mg/dL (ref 65–100)
Potassium: 4.1 mmol/L (ref 3.5–5.1)
Protein, total: 8.1 g/dL (ref 6.3–8.2)
Sodium: 137 mmol/L (ref 136–145)

## 2012-08-11 LAB — PROTHROMBIN TIME + INR
INR: 1.1 (ref 0.9–1.2)
Prothrombin time: 11.5 s (ref 9.6–11.6)

## 2012-08-11 LAB — HCG QL SERUM: HCG, Ql.: NEGATIVE

## 2012-08-11 MED ADMIN — HYDROmorphone (PF) (DILAUDID) injection 1 mg: INTRAVENOUS | @ 15:00:00 | NDC 00409255201

## 2012-08-11 MED ADMIN — HYDROmorphone (PF) (DILAUDID) injection 1 mg: INTRAVENOUS | @ 14:00:00 | NDC 00409255201

## 2012-08-11 MED ADMIN — heparin (porcine) pf 300 Units: @ 17:00:00

## 2012-08-11 MED ADMIN — sodium chloride 0.9 % bolus infusion 1,000 mL: INTRAVENOUS | @ 14:00:00 | NDC 00409798309

## 2012-08-11 MED ADMIN — ondansetron (ZOFRAN) injection 4 mg: INTRAVENOUS | @ 15:00:00 | NDC 00409475503

## 2012-08-11 MED ADMIN — ondansetron (ZOFRAN) injection 4 mg: INTRAVENOUS | @ 14:00:00 | NDC 00409475503

## 2012-08-11 MED FILL — HYDROMORPHONE (PF) 1 MG/ML IJ SOLN: 1 mg/mL | INTRAMUSCULAR | Qty: 1

## 2012-08-11 MED FILL — ONDANSETRON (PF) 4 MG/2 ML INJECTION: 4 mg/2 mL | INTRAMUSCULAR | Qty: 2

## 2012-08-11 NOTE — ED Provider Notes (Addendum)
HPI Comments: Left flank pain since yesterday. Same as prior kidney stones. Feels like someone twisting and squeezing in left back. Denies fever. Has nausea, no vomiting.    [name] [name]   Flank Pain   This is a recurrent problem. The current episode started yesterday. The problem has been gradually worsening. The problem occurs constantly. The pain is associated with no known injury. The pain is present in the left side. The quality of the pain is described as similar to previous episodes. The pain does not radiate. The pain is severe. The pain is the same all the time. Pertinent negatives include no chest pain, no fever, no headaches, no abdominal pain, no dysuria and no weakness. Risk factors include history of kidney stones.        Past Medical History   Diagnosis Date   ??? 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 Factor V Leiden mutation    ??? Chronic kidney disease      kidney stones   ??? Neurological disorder      migraines   ??? Psychiatric disorder      depression   ??? Infectious disease 02/2009      Hx MRSA / E.Coli bacteremia, MRSA wound infection   ??? Thromboembolus      Factor 5, Factor 2   ??? Factor V deficiency    ??? Factor II deficiency         Past Surgical History   Procedure Laterality Date   ??? Cystoscopy  ???   ??? Lithotripsy  2006   ??? Hx tonsillectomy     ??? Hx urological       cystoscopex13   ??? Hx appendectomy  ???   ??? Hx cesarean section     ??? Hx tubal ligation     ??? Pr vascular surgery procedure unlist       Port/removal   ??? Hx vascular access  July, 2010     Power Port placed in [place], [place]., removed   ??? Hx vascular access       pt has current rt subclavian port         Family History   Problem Relation Age of Onset   ??? Cancer Father      died from colon ca.   ??? Cancer Maternal Grandmother      metastatic breast CA.        History     Social History   ??? Marital Status: DIVORCED     Spouse Name: N/A      Number of Children: N/A   ??? Years of Education: N/A     Occupational History   ??? Not on file.     Social History Main Topics   ??? Smoking status: Never Smoker    ??? Smokeless tobacco: Never Used   ??? Alcohol Use: Yes      Comment: rarely   ??? Drug Use: No      Comment: narcotic seeking per hospital hx.   ??? Sexually Active: Yes -- Female partner(s)     Birth Control/ Protection: Surgical     Other Topics Concern   ??? Not on file     Social History Narrative   ??? No narrative on file                  ALLERGIES: Ativan; Codeine; Influenza  virus vaccines; Iodinated contrast media - iv dye; Ketorolac tromethamine; Morphine; Motrin; Pcn; Pneumococcal vaccine; Pneumovax 23; Prednisone; and Shellfish containing products      Review of Systems   Constitutional: Negative for fever.   HENT: Negative for hearing loss.    Eyes: Negative for visual disturbance.   Respiratory: Negative for cough and shortness of breath.    Cardiovascular: Negative for chest pain.   Gastrointestinal: Positive for nausea. Negative for vomiting and abdominal pain.   Genitourinary: Positive for flank pain. Negative for dysuria, hematuria, vaginal bleeding, vaginal discharge and difficulty urinating.   Musculoskeletal: Positive for back pain.   Skin: Negative for rash.   Neurological: Negative for weakness and headaches.   Psychiatric/Behavioral: Negative for confusion.       Filed Vitals:    08/11/12 0852   BP: 117/69   Pulse: 100   Temp: 98.9 ??F (37.2 ??C)   Resp: 18   Height: 5\' 4"  (1.626 m)   Weight: 64.411 kg (142 lb)   SpO2: 99%            Physical Exam   Nursing note and vitals reviewed.  Constitutional: She appears well-developed and well-nourished.   HENT:   Head: Normocephalic and atraumatic.   Right Ear: External ear normal.   Left Ear: External ear normal.   Nose: Nose normal.   Mouth/Throat: Oropharynx is clear and moist.   Eyes: Conjunctivae are normal. Pupils are equal, round, and reactive to light.   Neck: Normal range of motion. Neck supple.    Cardiovascular: Regular rhythm, normal heart sounds and intact distal pulses.    Pulmonary/Chest: Effort normal and breath sounds normal. No respiratory distress. She has no wheezes.   Abdominal: Soft. Bowel sounds are normal. She exhibits no distension. There is no tenderness.   Musculoskeletal: Normal range of motion. She exhibits no edema.   Neurological: She is alert.   Skin: Skin is warm and dry.   Psychiatric: Judgment normal.        MDM     Amount and/or Complexity of Data Reviewed:   Clinical lab tests:  Ordered and reviewed  Tests in the medicine section of the CPT??:  Ordered and reviewed   Review and summarize past medical records:  Yes  Risk of Significant Complications, Morbidity, and/or Mortality:   Presenting problems:  Moderate  Diagnostic procedures:  Moderate  Management options:  Moderate  General Comments: Multiple CT's and xrays in past. Will treat as renal stone. No reproducible tenderness to suggest surgical etiology.  Progress:   Patient progress:  Stable      Procedures    Pain improved. Will fu with Dr Katrinka Blazing, urology.

## 2012-08-11 NOTE — ED Notes (Signed)
Port flushed with heparin and needle pulled.  I have reviewed discharge instructions with the patient.  The patient was given prescriptions.  The patient verbalized understanding of all.  Pt ambulated out of ED without assistance, escorted by friends for transport home.

## 2012-08-11 NOTE — ED Notes (Signed)
Pt here with left flank pain and vomiting since yesterday, states known kidney stones in both kidneys, sees dr Katrinka Blazing urology.

## 2012-08-11 NOTE — ED Notes (Signed)
Pt is crying, stating that her pain is much worse since going to the bathroom. She was remedicated.

## 2012-08-20 LAB — CBC WITH AUTOMATED DIFF
ABS. BASOPHILS: 0 10*3/uL (ref 0.0–0.2)
ABS. EOSINOPHILS: 0.3 10*3/uL (ref 0.0–0.8)
ABS. IMM. GRANS.: 0 10*3/uL (ref 0.0–0.5)
ABS. LYMPHOCYTES: 1.8 10*3/uL (ref 0.5–4.6)
ABS. MONOCYTES: 0.4 10*3/uL (ref 0.1–1.3)
ABS. NEUTROPHILS: 1.3 10*3/uL — ABNORMAL LOW (ref 1.7–8.2)
BASOPHILS: 1 % (ref 0.0–2.0)
EOSINOPHILS: 7 % (ref 0.5–7.8)
HCT: 34.1 % — ABNORMAL LOW (ref 35.8–46.3)
HGB: 11.5 g/dL — ABNORMAL LOW (ref 11.7–15.4)
IMMATURE GRANULOCYTES: 0 % (ref 0.0–5.0)
LYMPHOCYTES: 48 % — ABNORMAL HIGH (ref 13–44)
MCH: 30.8 PG (ref 26.1–32.9)
MCHC: 33.7 g/dL (ref 31.4–35.0)
MCV: 91.4 FL (ref 79.6–97.8)
MONOCYTES: 10 % (ref 4.0–12.0)
MPV: 10 FL — ABNORMAL LOW (ref 10.8–14.1)
NEUTROPHILS: 34 % — ABNORMAL LOW (ref 43–78)
PLATELET: 246 10*3/uL (ref 150–450)
RBC: 3.73 M/uL — ABNORMAL LOW (ref 4.05–5.25)
RDW: 13.6 % (ref 11.9–14.6)
WBC: 3.7 10*3/uL — ABNORMAL LOW (ref 4.3–11.1)

## 2012-08-20 LAB — METABOLIC PANEL, COMPREHENSIVE
A-G Ratio: 1 — ABNORMAL LOW (ref 1.2–3.5)
ALT (SGPT): 23 U/L (ref 12–65)
AST (SGOT): 21 U/L (ref 15–37)
Albumin: 4 g/dL (ref 3.5–5.0)
Alk. phosphatase: 140 U/L — ABNORMAL HIGH (ref 50–136)
Anion gap: 8 mmol/L (ref 7–16)
BUN: 14 MG/DL (ref 6–23)
Bilirubin, total: 0.1 MG/DL — ABNORMAL LOW (ref 0.2–1.1)
CO2: 27 mmol/L (ref 21–32)
Calcium: 9.5 MG/DL (ref 8.3–10.4)
Chloride: 104 mmol/L (ref 98–107)
Creatinine: 1.1 MG/DL — ABNORMAL HIGH (ref 0.6–1.0)
GFR est AA: >60 mL/min/{1.73_m2} (ref >60)
GFR est non-AA: 57 mL/min/{1.73_m2} — ABNORMAL LOW (ref >60)
Globulin: 3.9 g/dL — ABNORMAL HIGH (ref 2.3–3.5)
Glucose: 101 mg/dL — ABNORMAL HIGH (ref 65–100)
Potassium: 4.4 mmol/L (ref 3.5–5.1)
Protein, total: 7.9 g/dL (ref 6.3–8.2)
Sodium: 139 mmol/L (ref 136–145)

## 2012-08-20 LAB — PROTHROMBIN TIME + INR
INR: 4.6 — CR (ref 0.9–1.2)
Prothrombin time: 51.5 s — ABNORMAL HIGH (ref 9.6–11.6)

## 2012-08-20 LAB — POC TROPONIN: Troponin-I (POC): 0 ng/ml (ref 0.0–0.08)

## 2012-08-20 LAB — PTT: aPTT: 63.8 s — ABNORMAL HIGH (ref 25.3–32.9)

## 2012-08-20 MED ADMIN — HYDROmorphone (PF) (DILAUDID) injection 1 mg: INTRAVENOUS | @ 20:00:00 | NDC 00409255201

## 2012-08-20 MED ADMIN — ondansetron (ZOFRAN) injection 4 mg: INTRAVENOUS | @ 20:00:00 | NDC 00409475503

## 2012-08-20 MED ADMIN — nuclear medicine isotope: @ 23:00:00 | NDC 88888888806

## 2012-08-20 MED ADMIN — saline peripheral flush soln 10 mL: @ 23:00:00 | NDC 87701099893

## 2012-08-20 MED ADMIN — heparin (porcine) pf 100 unit/mL: @ 23:00:00

## 2012-08-20 MED ADMIN — HYDROmorphone (PF) (DILAUDID) injection 0.5 mg: INTRAVENOUS | @ 21:00:00 | NDC 00409255201

## 2012-08-20 MED FILL — ONDANSETRON (PF) 4 MG/2 ML INJECTION: 4 mg/2 mL | INTRAMUSCULAR | Qty: 2

## 2012-08-20 MED FILL — HYDROMORPHONE (PF) 1 MG/ML IJ SOLN: 1 mg/mL | INTRAMUSCULAR | Qty: 1

## 2012-08-20 MED FILL — NUCLEAR MEDICINE ISOTOPE: Qty: 1

## 2012-08-20 MED FILL — HEPARIN LOCK FLUSH (PORCINE) (PF) 100 UNIT/ML INTRAVENOUS SYRINGE: 100 unit/mL | INTRAVENOUS | Qty: 3

## 2012-08-20 NOTE — ED Notes (Signed)
condition unchanged pt c/o with chest soreness when she breaths O sat 95-98% ra

## 2012-08-20 NOTE — ED Notes (Signed)
Md informed that pt was c/o pain when she takes deep breath

## 2012-08-20 NOTE — ED Provider Notes (Signed)
HPI Comments: 83 wf with history of factor five coag complains of severe pain in the upper right chest that radiates to the back. 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 She states that this pain is like the one she had when she had a bloodclot in the past. No chest wall pain.     Patient is a 47 y.o. female presenting with chest pain.   Chest Pain   Associated symptoms include shortness of breath. Pertinent negatives include no fever.        Past Medical History   Diagnosis Date   ??? 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 Factor V Leiden mutation    ??? Chronic kidney disease      kidney stones   ??? Neurological disorder      migraines   ??? Psychiatric disorder      depression   ??? Infectious disease 02/2009      Hx MRSA / E.Coli bacteremia, MRSA wound infection   ??? Thromboembolus      Factor 5, Factor 2   ??? Factor V deficiency    ??? Factor II deficiency         Past Surgical History   Procedure Laterality Date   ??? Cystoscopy  ???   ??? Lithotripsy  2006   ??? Hx tonsillectomy     ??? Hx urological       cystoscopex13   ??? Hx appendectomy  ???   ??? Hx cesarean section     ??? Hx tubal ligation     ??? Pr vascular surgery procedure unlist       Port/removal   ??? Hx vascular access  July, 2010     Power Port placed in [place], [place] removed   ??? Hx vascular access       pt has current rt subclavian port         Family History   Problem Relation Age of Onset   ??? Cancer Father      died from colon ca.   ??? Cancer Maternal Grandmother      metastatic breast CA.        History     Social History   ??? Marital Status: DIVORCED     Spouse Name: N/A     Number of Children: N/A   ??? Years of Education: N/A     Occupational History   ??? Not on file.     Social History Main Topics   ??? Smoking status: Never Smoker    ??? Smokeless tobacco: Never Used   ??? Alcohol Use: Yes      Comment: rarely   ??? Drug Use: No      Comment: narcotic seeking per hospital hx.   ??? Sexually Active: Yes -- Female  partner(s)     Birth Control/ Protection: Surgical     Other Topics Concern   ??? Not on file     Social History Narrative   ??? No narrative on file                  ALLERGIES: Ativan; Codeine; Influenza virus vaccines; Iodinated contrast media - iv dye; Ketorolac tromethamine; Morphine; Motrin; Pcn; Pneumococcal vaccine; Pneumovax 23; Prednisone; and Shellfish containing products      Review of Systems   Constitutional: Negative for fever.   HENT: Negative for dental problem and sinus pressure.  Respiratory: Positive for shortness of breath. Negative for choking and chest tightness.    Cardiovascular: Positive for chest pain.   All other systems reviewed and are negative.        Filed Vitals:    08/20/12 1414 08/20/12 1502   BP: 118/54    Pulse: 92    Temp: 98 ??F (36.7 ??C)    Resp: 18    Height: 5\' 4"  (1.626 m)    Weight: 64.411 kg (142 lb)    SpO2: 99% 96%            Physical Exam   Constitutional: She is oriented to person, place, and time. She appears well-developed and well-nourished.   HENT:   Head: Normocephalic and atraumatic.   Right Ear: External ear normal.   Left Ear: External ear normal.   Nose: Nose normal.   Mouth/Throat: Oropharynx is clear and moist.   Eyes: Conjunctivae and EOM are normal. Pupils are equal, round, and reactive to light. Right eye exhibits no discharge. Left eye exhibits no discharge. No scleral icterus.   Neck: Normal range of motion. Neck supple. No JVD present. No tracheal deviation present. No thyromegaly present.   Cardiovascular: Normal rate, regular rhythm, normal heart sounds and intact distal pulses.  Exam reveals no gallop and no friction rub.    No murmur heard.  Pulmonary/Chest: Effort normal and breath sounds normal. No respiratory distress.   Abdominal: Soft. Bowel sounds are normal. She exhibits no mass. There is no tenderness.   Musculoskeletal: She exhibits no edema.   Neurological: She is alert and oriented to person, place, and time. She displays normal reflexes.  No cranial nerve deficit. Coordination normal.   Skin: Skin is warm and dry. No rash noted. No erythema.   Psychiatric: She has a normal mood and affect. Her behavior is normal.        MDM    Procedures

## 2012-08-20 NOTE — ED Notes (Signed)
I have reviewed discharge instructions with the patient. Prescription(s) were not given.  All questions answered.  The patient verbalized understanding.  Discharge ambualtroy in no distress with patient.

## 2012-08-20 NOTE — ED Notes (Signed)
Dr. Ernestine Conrad to room

## 2012-08-20 NOTE — ED Notes (Signed)
Assumed care of patient at shift change. Patient history, physical, and results reviewed by myself. Independent exam performed.    VQ scan unchanged from March. INR supratherapeutic. Will fu with pcp for further eval. Low prob for cardiac disease. Likely pleurisy.

## 2012-08-20 NOTE — ED Notes (Signed)
Report given to D Watson RN

## 2012-08-20 NOTE — ED Notes (Signed)
No hypoxia or tachycardia. Pt comfortable without any distress.

## 2012-08-21 LAB — EKG, 12 LEAD, INITIAL
Atrial Rate: 88 {beats}/min
Calculated P Axis: 72 degrees
Calculated R Axis: 83 degrees
Calculated T Axis: 71 degrees
Diagnosis: NORMAL
P-R Interval: 160 ms
Q-T Interval: 350 ms
QRS Duration: 90 ms
QTC Calculation (Bezet): 423 ms
Ventricular Rate: 88 {beats}/min

## 2012-09-01 LAB — METABOLIC PANEL, BASIC
Anion gap: 11 mmol/L (ref 7–16)
BUN: 13 MG/DL (ref 6–23)
CO2: 26 mmol/L (ref 21–32)
Calcium: 9.3 MG/DL (ref 8.3–10.4)
Chloride: 103 mmol/L (ref 98–107)
Creatinine: 1 MG/DL (ref 0.6–1.0)
GFR est AA: >60 mL/min/{1.73_m2} (ref >60)
GFR est non-AA: >60 mL/min/{1.73_m2} (ref >60)
Glucose: 111 mg/dL — ABNORMAL HIGH (ref 65–100)
Potassium: 4 mmol/L (ref 3.5–5.1)
Sodium: 140 mmol/L (ref 136–145)

## 2012-09-01 LAB — EKG, 12 LEAD, INITIAL
Atrial Rate: 95 {beats}/min
Calculated P Axis: 69 degrees
Calculated R Axis: 83 degrees
Calculated T Axis: 62 degrees
P-R Interval: 164 ms
Q-T Interval: 338 ms
QRS Duration: 86 ms
QTC Calculation (Bezet): 424 ms
Ventricular Rate: 95 {beats}/min

## 2012-09-01 LAB — POC TROPONIN: Troponin-I (POC): 0 ng/ml (ref 0.0–0.08)

## 2012-09-01 NOTE — ED Notes (Signed)
Xray to room for pt. Pt not in room and port access removed and lying on bed.

## 2012-09-01 NOTE — ED Notes (Signed)
Patient c/o chest pain that started this AM.

## 2012-09-01 NOTE — ED Notes (Signed)
Called to pt room/  Pt not in room/  Gown on bed / huber needle and attached tubing lying on bed.  Dr Verlon Au notified.  "No ability to obtain informed refusal from patient as patient left without notifying staff.

## 2012-09-01 NOTE — ED Notes (Signed)
Saw patient / history / exam / request for [facility] records plan / she said can get chest CT "if give me Benadryl 50 mg IV before they do it"  Nurse went in to initiate care and patient had pulled port access and eloped out a back door.

## 2012-09-01 NOTE — ED Provider Notes (Signed)
HPI Comments: Here with some chest discomfort that goes to her back. No ripping or tearing pain. Some worse to deep breath. No fever/cough or sputum. Last PE 03/2012. On Lovenox and has greenfield filter. No signif SOB. No sweats. Not noted any leg swelling    Patient is a 47 y.o. female presenting with chest pain. [name]   Chest Pain (Angina)   This is a recurrent problem. The current episode started yesterday. The problem has been resolved. The pain is associated with normal activity. The pain is moderate. The quality of the pain is described as stabbing. The pain does not radiate. Pertinent negatives include no cough, no diaphoresis, no exertional chest pressure, no fever, no hemoptysis, no irregular heartbeat, no leg pain, no near-syncope, no shortness of breath, no sputum production and no vomiting. Her past medical history is significant for DVT.Procedural history includes Greenfield stents.       Past Medical History   Diagnosis Date   ??? 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 Factor V Leiden mutation    ??? Chronic kidney disease      kidney stones   ??? Neurological disorder      migraines   ??? Psychiatric disorder      depression   ??? Infectious disease 02/2009      Hx MRSA / E.Coli bacteremia, MRSA wound infection   ??? Thromboembolus      Factor 5, Factor 2   ??? Factor V deficiency    ??? Factor II deficiency         Past Surgical History   Procedure Laterality Date   ??? Cystoscopy  ???   ??? Lithotripsy  2006   ??? Hx tonsillectomy     ??? Hx urological       cystoscopex13   ??? Hx appendectomy  ???   ??? Hx cesarean section     ??? Hx tubal ligation     ??? Pr vascular surgery procedure unlist       Port/removal   ??? Hx vascular access  July, 2010     Power Port placed in [place], [place]., removed   ??? Hx vascular access       pt has current rt subclavian port         Family History   Problem Relation Age of Onset   ??? Cancer Father      died from colon ca.   ??? Cancer Maternal  Grandmother      metastatic breast CA.        History     Social History   ??? Marital Status: DIVORCED     Spouse Name: N/A     Number of Children: N/A   ??? Years of Education: N/A     Occupational History   ??? Not on file.     Social History Main Topics   ??? Smoking status: Never Smoker    ??? Smokeless tobacco: Never Used   ??? Alcohol Use: Yes      Comment: rarely   ??? Drug Use: No      Comment: narcotic seeking per hospital hx.   ??? Sexually Active: Yes -- Female partner(s)     Birth Control/ Protection: Surgical     Other Topics Concern   ??? Not on file     Social History Narrative   ??? No narrative on file  ALLERGIES: Ativan; Codeine; Iodinated contrast media - iv dye; Ketorolac tromethamine; Motrin; Pcn; Pneumococcal vaccine; Pneumovax 23; and Shellfish containing products      Review of Systems   Constitutional: Negative for fever, chills and diaphoresis.   Respiratory: Negative for cough, hemoptysis, sputum production and shortness of breath.    Cardiovascular: Positive for chest pain. Negative for near-syncope.   Gastrointestinal: Negative for vomiting.   All other systems reviewed and are negative.        Filed Vitals:    09/01/12 1110 09/01/12 1114   BP:  145/67   Pulse:  93   Resp: 18    Height: 5' 4.5" (1.638 m)    Weight: 64.411 kg (142 lb)             Physical Exam   Nursing note and vitals reviewed.  Constitutional: She appears well-developed and well-nourished. No distress.   HENT:   Head: Atraumatic.   Left Ear: External ear normal.   Eyes: No scleral icterus.   Neck: Neck supple.   Cardiovascular: Normal rate, regular rhythm, normal heart sounds and intact distal pulses.  Exam reveals no friction rub.    No murmur heard.  Pulmonary/Chest: Effort normal. No respiratory distress. She has no wheezes. She has no rales.   Abdominal: Soft. Normal aorta and bowel sounds are normal. She exhibits no pulsatile midline mass. There is no tenderness.   Musculoskeletal: She exhibits no edema and no  tenderness.   Neurological: She is alert. Coordination normal.   Skin: Skin is warm and dry.   Psychiatric: Thought content normal.   Flat affect        MDM     Differential Diagnosis; Clinical Impression; Plan:     Eloped after exam, very vague of recent care and meds  Amount and/or Complexity of Data Reviewed:    Decide to obtain previous medical records or to obtain history from someone other than the patient:  Yes      Procedures

## 2012-09-21 LAB — CBC WITH AUTOMATED DIFF
ABS. BASOPHILS: 0 10*3/uL (ref 0.0–0.2)
ABS. EOSINOPHILS: 0.3 10*3/uL (ref 0.0–0.8)
ABS. IMM. GRANS.: 0 10*3/uL (ref 0.0–0.5)
ABS. LYMPHOCYTES: 1.4 10*3/uL (ref 0.5–4.6)
ABS. MONOCYTES: 0.5 10*3/uL (ref 0.1–1.3)
ABS. NEUTROPHILS: 2.6 10*3/uL (ref 1.7–8.2)
BASOPHILS: 0 % (ref 0.0–2.0)
EOSINOPHILS: 6 % (ref 0.5–7.8)
HCT: 34.2 % — ABNORMAL LOW (ref 35.8–46.3)
HGB: 11.3 g/dL — ABNORMAL LOW (ref 11.7–15.4)
IMMATURE GRANULOCYTES: 0 % (ref 0.0–5.0)
LYMPHOCYTES: 30 % (ref 13–44)
MCH: 30.7 PG (ref 26.1–32.9)
MCHC: 33 g/dL (ref 31.4–35.0)
MCV: 92.9 FL (ref 79.6–97.8)
MONOCYTES: 10 % (ref 4.0–12.0)
MPV: 10.7 FL — ABNORMAL LOW (ref 10.8–14.1)
NEUTROPHILS: 54 % (ref 43–78)
PLATELET: 175 10*3/uL (ref 150–450)
RBC: 3.68 M/uL — ABNORMAL LOW (ref 4.05–5.25)
RDW: 13.4 % (ref 11.9–14.6)
WBC: 4.8 10*3/uL (ref 4.3–11.1)

## 2012-09-21 LAB — METABOLIC PANEL, BASIC
Anion gap: 10 mmol/L (ref 7–16)
BUN: 13 MG/DL (ref 6–23)
CO2: 26 mmol/L (ref 21–32)
Calcium: 9.3 MG/DL (ref 8.3–10.4)
Chloride: 104 mmol/L (ref 98–107)
Creatinine: 1.1 MG/DL — ABNORMAL HIGH (ref 0.6–1.0)
GFR est AA: >60 mL/min/{1.73_m2} (ref >60)
GFR est non-AA: 57 mL/min/{1.73_m2} — ABNORMAL LOW (ref >60)
Glucose: 91 mg/dL (ref 65–100)
Potassium: 4.5 mmol/L (ref 3.5–5.1)
Sodium: 140 mmol/L (ref 136–145)

## 2012-09-21 LAB — POC TROPONIN: Troponin-I (POC): 0.01 ng/ml (ref 0.0–0.08)

## 2012-09-21 LAB — PROTHROMBIN TIME + INR
INR: 1.8 — ABNORMAL HIGH (ref 0.9–1.2)
Prothrombin time: 19.3 s — ABNORMAL HIGH (ref 9.6–11.6)

## 2012-09-21 LAB — BNP: BNP: 143 pg/mL

## 2012-09-21 MED ADMIN — morphine injection 4 mg: INTRAMUSCULAR | @ 20:00:00 | NDC 00409189101

## 2012-09-21 MED ADMIN — HYDROcodone-acetaminophen (NORCO) 5-325 mg per tablet 1 Tab: ORAL | @ 22:00:00 | NDC 68084036811

## 2012-09-21 NOTE — ED Provider Notes (Signed)
Patient is a 47 y.o. female presenting with chest pain. [name]   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 The problem has not changed since onset.The problem occurs constantly. The pain is present in the left side. The pain is moderate. The quality of the pain is described as stabbing. The pain does not radiate. Pertinent negatives include no abdominal pain, no cough, no diaphoresis, no fever and no nausea. Her past medical history is significant for PE.       Past Medical History   Diagnosis Date   ??? 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 Factor V Leiden mutation    ??? Chronic kidney disease      kidney stones   ??? Neurological disorder      migraines   ??? Psychiatric disorder      depression   ??? Infectious disease 02/2009      Hx MRSA / E.Coli bacteremia, MRSA wound infection   ??? Thromboembolus      Factor 5, Factor 2   ??? Factor V deficiency    ??? Factor II deficiency    ??? Ill-defined condition      factor 5         Past Surgical History   Procedure Laterality Date   ??? Cystoscopy  ???   ??? Lithotripsy  2006   ??? Hx tonsillectomy     ??? Hx urological       cystoscopex13   ??? Hx appendectomy  ???   ??? Hx cesarean section     ??? Hx tubal ligation     ??? Pr vascular surgery procedure unlist       Port/removal   ??? Hx vascular access  July, 2010     Power Port placed in [place], [place] removed   ??? Hx vascular access       pt has current rt subclavian port         Family History   Problem Relation Age of Onset   ??? Cancer Father      died from colon ca.   ??? Cancer Maternal Grandmother      metastatic breast CA.        History     Social History   ??? Marital Status: DIVORCED     Spouse Name: N/A     Number of Children: N/A   ??? Years of Education: N/A     Occupational History   ??? Not on file.     Social History Main Topics   ??? Smoking status: Never Smoker    ??? Smokeless tobacco: Never Used   ??? Alcohol Use: Yes      Comment: rarely    ??? Drug Use: No      Comment: narcotic seeking per hospital hx.   ??? Sexually Active: Yes -- Female partner(s)     Birth Control/ Protection: Surgical     Other Topics Concern   ??? Not on file     Social History Narrative   ??? No narrative on file                  ALLERGIES: Ativan; Codeine; Iodinated contrast media - iv dye; Ketorolac tromethamine; Motrin; Pcn; Pneumococcal vaccine; Pneumovax 23; and Shellfish containing products      Review of Systems   Constitutional: Negative for fever and diaphoresis.  Respiratory: Negative for cough, wheezing and stridor.    Cardiovascular: Positive for chest pain.   Gastrointestinal: Negative for nausea, abdominal pain and anal bleeding.   Skin: Negative.    All other systems reviewed and are negative.        Filed Vitals:    09/21/12 1517   BP: 127/82   Pulse: 90   Temp: 98.4 ??F (36.9 ??C)   Resp: 16   Height: 5\' 4"  (1.626 m)   Weight: 64.864 kg (143 lb)            Physical Exam   Nursing note and vitals reviewed.  Constitutional: She is oriented to person, place, and time. She appears well-developed and well-nourished. No distress.   HENT:   Head: Normocephalic and atraumatic.   Eyes: Conjunctivae are normal. No scleral icterus.   Neck: Normal range of motion. Neck supple.   Cardiovascular: Normal rate, regular rhythm and normal heart sounds.    Pulmonary/Chest: Effort normal and breath sounds normal. No stridor. No respiratory distress. She has no wheezes. She has no rales. She exhibits no tenderness.   Abdominal: Soft. There is no tenderness. There is no rebound and no guarding.   Neurological: She is alert and oriented to person, place, and time.   No focal weakness   Skin: Skin is warm and dry. No rash noted. She is not diaphoretic.   Psychiatric: She has a normal mood and affect. Her behavior is normal.        MDM     Differential Diagnosis; Clinical Impression; Plan:     Pt looks well in no distress o2 saturation is currently 99% on the monitor on RA.  Pt has had multiple  workups of this in the past including 12 v/q scans at our facility.  She states she has been getting CT scans of her chest this year for it with prep for allergy.  Given multiple similar presentations normal sat and that she is already on coumadin with Greenfield filter.  I am going to discharge.    Amount and/or Complexity of Data Reviewed:   Clinical lab tests:  Ordered and reviewed (Results for orders placed during the hospital encounter of 09/21/12    -CBC WITH AUTOMATED DIFF     WBC K/uL                      4.8     Range: 4.3 - 11.1 K/uL     RBC M/uL                      3.68 (*)Range: 4.05 - 5.25 M/uL     HGB g/dL                      16.1 (*)Range: 11.7 - 15.4 g/dL     HCT %                         34.2 (*)Range: 35.8 - 46.3 %     MCV FL                        92.9    Range: 79.6 - 97.8 FL     MCH PG                        30.7    Range: 26.1 - 32.9 PG  MCHC g/dL                     46.9    Range: 31.4 - 35.0 g/dL     RDW %                         13.4    Range: 11.9 - 14.6 %     PLATELET K/uL                 175     Range: 150 - 450 K/uL     MPV FL                        10.7 (*)Range: 10.8 - 14.1 FL     DF                            AUTOMATED                NEUTROPHILS %                 54      Range: 43 - 78 %     LYMPHOCYTES %                 30      Range: 13 - 44 %     MONOCYTES %                   10      Range: 4.0 - 12.0 %     EOSINOPHILS %                 6       Range: 0.5 - 7.8 %     BASOPHILS %                   0       Range: 0.0 - 2.0 %     IMMATURE GRANULOCYTES %       0.0     Range: 0.0 - 5.0 %     ABS. NEUTROPHILS K/UL         2.6     Range: 1.7 - 8.2 K/UL     ABS. LYMPHOCYTES K/UL         1.4     Range: 0.5 - 4.6 K/UL     ABS. MONOCYTES K/UL           0.5     Range: 0.1 - 1.3 K/UL     ABS. EOSINOPHILS K/UL         0.3     Range: 0.0 - 0.8 K/UL     ABS. BASOPHILS K/UL           0.0     Range: 0.0 - 0.2 K/UL     ABS. IMM. GRANS. K/UL         0.0     Range: 0.0 - 0.5 K/UL    -METABOLIC  PANEL, BASIC     Sodium mmol/L                 140     Range: 136 - 145 mmol/L     Potassium mmol/L              4.5     Range: 3.5 - 5.1 mmol/L  Chloride mmol/L               104     Range: 98 - 107 mmol/L     CO2 mmol/L                    26      Range: 21 - 32 mmol/L     Anion gap mmol/L              10      Range: 7 - 16 mmol/L     Glucose mg/dL                 91      Range: 65 - 100 mg/dL     BUN MG/DL                     13      Range: 6 - 23 MG/DL     Creatinine MG/DL              4.09 (*)Range: 0.6 - 1.0 MG/DL     GFR est AA WJ/XBJ/4.78G9      >60     Range: >60 ml/min/1.8m2     GFR est non-AA ml/min/1.23m2  57 (*)  Range: >60 ml/min/1.76m2     Calcium MG/DL                 9.3     Range: 8.3 - 10.4 MG/DL    -PROTHROMBIN TIME + INR     Prothrombin time sec          19.3 (*)Range: 9.6 - 11.6 sec     INR                           1.8 (*) Range: 0.9 - 1.2      -BNP     BNP pg/mL                     143                   -POC TROPONIN-I     Troponin-I (POC) ng/ml        0.01    Range: 0.0 - 0.08 ng/ml    -EKG, 12 LEAD, INITIAL     Systolic BP mmHg                                     Diastolic BP mmHg                                    Ventricular Rate BPM          86                     Atrial Rate BPM               86                     P-R Interval ms               166                    QRS Duration ms  94                     Q-T Interval ms               344                    QTC Calculation (Bezet) ms    411                    Calculated P Axis degrees     63                     Calculated R Axis degrees     92                     Calculated T Axis degrees     67                     Diagnosis                                        )  Tests in the radiology section of CPT??:  Ordered and reviewed (Mild perihilar densities minimally changed from old 1.  )   Independant visualization of image, tracing, or specimen:  Yes      Procedures

## 2012-09-21 NOTE — ED Notes (Signed)
I have reviewed medications, follow up provider options, and discharge instructions with the patient. The patient verbalized understanding. Copy of discharge information given to patient upon discharge. Prescription(s) given to patient. Patient discharged in no distress.

## 2012-09-22 LAB — EKG, 12 LEAD, INITIAL
Atrial Rate: 86 {beats}/min
Calculated P Axis: 63 degrees
Calculated R Axis: 92 degrees
Calculated T Axis: 67 degrees
P-R Interval: 166 ms
Q-T Interval: 344 ms
QRS Duration: 94 ms
QTC Calculation (Bezet): 411 ms
Ventricular Rate: 86 {beats}/min

## 2012-10-02 LAB — URINE MICROSCOPIC: RBC: >100 /hpf — ABNORMAL HIGH

## 2012-10-02 MED ADMIN — hydromorphone (PF) (DILAUDID) injection 1 mg: INTRAMUSCULAR | @ 17:00:00 | NDC 00409255201

## 2012-10-02 MED ADMIN — ondansetron (ZOFRAN ODT) tablet 8 mg: ORAL | @ 17:00:00 | NDC 68462015840

## 2012-10-02 NOTE — ED Notes (Signed)
I have reviewed discharge instructions with the patient.  The patient verbalized understanding. Prescritptions given times 3. Medicaid Krista Lopez called at pt request for a ride home

## 2012-10-02 NOTE — ED Provider Notes (Addendum)
[name] [name]   Flank Pain   This is a new problem. The current episode started yesterday. The problem has not changed since onset.The problem occurs constantly. The pain is associated with no known injury. The quality of the pain is described as stabbing. The pain radiates to the left groin. Pertinent negatives include no fever, no numbness, no dysuria and no weakness. She has tried nothing for the symptoms.        Past Medical History   Diagnosis Date   ??? 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 Factor V Leiden mutation    ??? Chronic kidney disease      kidney stones   ??? Neurological disorder      migraines   ??? Psychiatric disorder      depression   ??? Infectious disease 02/2009      Hx MRSA / E.Coli bacteremia, MRSA wound infection   ??? Thromboembolus      Factor 5, Factor 2   ??? Factor V deficiency    ??? Factor II deficiency    ??? Ill-defined condition      factor 5    ??? Iron deficiency 10/06/2008        Past Surgical History   Procedure Laterality Date   ??? Cystoscopy  ???   ??? Lithotripsy  2006   ??? Hx tonsillectomy     ??? Hx urological       cystoscopex13   ??? Hx appendectomy  ???   ??? Hx cesarean section     ??? Hx tubal ligation     ??? Pr vascular surgery procedure unlist       Port/removal   ??? Hx vascular access  July, 2010     Power Port placed in [place], [place]., removed   ??? Hx vascular access       pt has current rt subclavian port         Family History   Problem Relation Age of Onset   ??? Cancer Father      died from colon ca.   ??? Cancer Maternal Grandmother      metastatic breast CA.        History     Social History   ??? Marital Status: DIVORCED     Spouse Name: N/A     Number of Children: N/A   ??? Years of Education: N/A     Occupational History   ??? Not on file.     Social History Main Topics   ??? Smoking status: Never Smoker    ??? Smokeless tobacco: Never Used   ??? Alcohol Use: Yes      Comment: rarely   ??? Drug Use: No       Comment: narcotic seeking per hospital hx.   ??? Sexually Active: Yes -- Female partner(s)     Birth Control/ Protection: Surgical     Other Topics Concern   ??? Not on file     Social History Narrative   ??? No narrative on file                  ALLERGIES: Ativan; Codeine; Iodinated contrast media - iv dye; Ketorolac tromethamine; Motrin; Pcn; Pneumococcal vaccine; Pneumovax 23; and Shellfish containing products      Review of Systems   Constitutional: Negative for fever and chills.   HENT: Negative for  congestion and rhinorrhea.    Respiratory: Negative for cough and shortness of breath.    Gastrointestinal: Positive for nausea. Negative for vomiting.   Genitourinary: Positive for flank pain. Negative for dysuria, urgency and hematuria.   Musculoskeletal: Positive for back pain. Negative for myalgias.   Neurological: Negative for weakness and numbness.       Filed Vitals:    10/02/12 1044   BP: 122/83   Pulse: 97   Temp: 98.2 ??F (36.8 ??C)   Resp: 16   Height: 5\' 4"  (1.626 m)   Weight: 65.772 kg (145 lb)   SpO2: 99%            Physical Exam   Nursing note and vitals reviewed.  Constitutional: She appears well-developed and well-nourished.   Eyes: Conjunctivae are normal. Pupils are equal, round, and reactive to light.   Cardiovascular: Normal rate, regular rhythm and normal heart sounds.    Pulmonary/Chest: Effort normal and breath sounds normal.   Abdominal: Soft. Bowel sounds are normal. She exhibits no distension. There is no tenderness. There is no rebound and no guarding.   Musculoskeletal: Normal range of motion. She exhibits no edema.   Skin: Skin is warm and dry.        MDM     Differential Diagnosis; Clinical Impression; Plan:     Has hematuria, possible stone left side on KUB.  Patient with  Multiple CT's in the past of chest and abdomen-do not think CT is worth the risk of further radiation exposure.  Will treat renal colic and refer to her urologist.  Amount and/or Complexity of Data Reviewed:   Clinical lab  tests:  Ordered and reviewed  Tests in the radiology section of CPT??:  Ordered and reviewed      Procedures

## 2012-10-02 NOTE — ED Notes (Signed)
C/o left flank pain that started yesterday. Hx of kidney stones. C/o nausea. Denies chest pain, SOB, diarrhea, fever.

## 2012-10-12 NOTE — ED Notes (Signed)
Lower right abd pain / pt states dx with a kidney stone at [facility] last Saturday / pt states urologist Nona Dell / pt states no openings until this Thursday and told to return to ER with increase in pain.  Pt states given Norco prescrip but out of med on Wednesday

## 2012-10-12 NOTE — ED Notes (Signed)
Unable to locate pt in ER

## 2012-10-12 NOTE — ED Provider Notes (Signed)
Patient is a 47 y.o. female presenting with abdominal pain.   Abdominal Pain          Past Medical History   Diagnosis Date   ??? 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 Factor V Leiden mutation    ??? Chronic kidney disease      kidney stones   ??? Neurological disorder      migraines   ??? Psychiatric disorder      depression   ??? Infectious disease 02/2009      Hx MRSA / E.Coli bacteremia, MRSA wound infection   ??? Thromboembolus      Factor 5, Factor 2   ??? Factor V deficiency    ??? Factor II deficiency    ??? Ill-defined condition      factor 5    ??? Iron deficiency 10/06/2008        Past Surgical History   Procedure Laterality Date   ??? Cystoscopy  ???   ??? Lithotripsy  2006   ??? Hx tonsillectomy     ??? Hx urological       cystoscopex13   ??? Hx appendectomy  ???   ??? Hx cesarean section     ??? Hx tubal ligation     ??? Pr vascular surgery procedure unlist       Port/removal   ??? Hx vascular access  July, 2010     Power Port placed in [place], [place]., removed   ??? Hx vascular access       pt has current rt subclavian port         Family History   Problem Relation Age of Onset   ??? Cancer Father      died from colon ca.   ??? Cancer Maternal Grandmother      metastatic breast CA.        History     Social History   ??? Marital Status: DIVORCED     Spouse Name: N/A     Number of Children: N/A   ??? Years of Education: N/A     Occupational History   ??? Not on file.     Social History Main Topics   ??? Smoking status: Never Smoker    ??? Smokeless tobacco: Never Used   ??? Alcohol Use: Yes      Comment: rarely   ??? Drug Use: No      Comment: narcotic seeking per hospital hx.   ??? Sexually Active: Yes -- Female partner(s)     Birth Control/ Protection: Surgical     Other Topics Concern   ??? Not on file     Social History Narrative   ??? No narrative on file                  ALLERGIES: Ativan; Codeine; Iodinated contrast media - iv dye; Ketorolac tromethamine; Motrin; Pcn; Pneumococcal vaccine; Pneumovax 23; and Shellfish containing products       Review of Systems   Gastrointestinal: Positive for abdominal pain.       Filed Vitals:    10/12/12 0829   BP: 147/92   Pulse: 94   Temp: 98.6 ??F (37 ??C)   Resp: 20   Height: 5\' 4"  (1.626 m)   Weight: 64.411 kg (142 lb)   SpO2: 100%            Physical Exam     MDM    Procedures    9:23  AM  Patient not in the room for evaluation.  Urine dipped negative.  RN to look for patient.  She may have eloped prior to physician seeing her.

## 2012-10-12 NOTE — ED Notes (Signed)
Straight cath completed / urinalysis completed and results given to MD

## 2012-10-25 MED ADMIN — ondansetron (ZOFRAN ODT) tablet 8 mg: ORAL | @ 23:00:00 | NDC 68001024716

## 2012-10-25 MED ADMIN — hydrocodone-acetaminophen (NORCO) 5-325 mg per tablet 1 tablet: ORAL | @ 23:00:00 | NDC 68084036811
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
## 2012-10-25 NOTE — ED Notes (Signed)
I have reviewed discharge instructions with the patient.  The patient verbalized understanding.

## 2012-10-30 MED ADMIN — diphenhydrAMINE (BENADRYL) injection 25 mg: INTRAVENOUS | @ 21:00:00 | NDC 00641037621

## 2012-10-30 MED ADMIN — methylPREDNISolone (PF) (SOLU-MEDROL) injection 125 mg: INTRAVENOUS | @ 21:00:00 | NDC 00009004725

## 2012-10-30 MED ADMIN — promethazine (PHENERGAN) injection 25 mg: INTRAVENOUS | @ 21:00:00 | NDC 00641092821

## 2012-10-30 MED ADMIN — heparin (porcine) pf 300 Units

## 2012-10-30 MED ADMIN — sodium chloride 0.9 % bolus infusion 1,000 mL: INTRAVENOUS | @ 21:00:00 | NDC 00409798309

## 2012-10-30 MED ADMIN — morphine injection 4 mg: INTRAVENOUS | @ 23:00:00 | NDC 00409189101

## 2012-10-30 NOTE — ED Notes (Signed)
Light turned off for pt. Warm blanket given to pt

## 2012-10-30 NOTE — ED Notes (Signed)
Dr. Brown was in to eval pt

## 2012-10-30 NOTE — ED Notes (Addendum)
C/o migraine that started Thursday. Hx of migraines, no change compared to other migraines. C/o n/v that started yesterday. C/o sensitivity to light. States that she has taken her migraine meds without relief. Denies chest pain, SOB, diarrhea, or fever.

## 2012-10-30 NOTE — ED Notes (Signed)
Pt states her headache is not much better. Iv fluids running. Dr. Manson Passey was notified

## 2012-10-30 NOTE — ED Provider Notes (Signed)
Patient is a 47 y.o. female presenting with headaches. [name]   Headache   This is a new problem. The current episode started more than 2 days ago. The problem occurs hourly. The problem has not changed since onset.The headache is aggravated by nothing. The pain is located in the frontal region. The quality of the pain is described as dull and throbbing. The pain is at a severity of 5/10. The pain is moderate. Associated symptoms include malaise/fatigue and nausea. Pertinent negatives include no anorexia, no palpitations, no syncope, no shortness of breath, no weakness, no tingling, no dizziness, no visual change and no vomiting. She has tried nothing for the symptoms.        Past Medical History   Diagnosis Date   ??? 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 Factor V Leiden mutation    ??? Chronic kidney disease      kidney stones   ??? Neurological disorder      migraines   ??? Psychiatric disorder      depression   ??? Infectious disease 02/2009      Hx MRSA / E.Coli bacteremia, MRSA wound infection   ??? Thromboembolus      Factor 5, Factor 2   ??? Factor V deficiency    ??? Factor II deficiency    ??? Ill-defined condition      factor 5    ??? Iron deficiency 10/06/2008        Past Surgical History   Procedure Laterality Date   ??? Cystoscopy  ???   ??? Lithotripsy  2006   ??? Hx tonsillectomy     ??? Hx urological       cystoscopex13   ??? Hx appendectomy  ???   ??? Hx cesarean section     ??? Hx tubal ligation     ??? Pr vascular surgery procedure unlist       Port/removal   ??? Hx vascular access  July, 2010     Power Port placed in [place], [place]., removed   ??? Hx vascular access       pt has current rt subclavian port         Family History   Problem Relation Age of Onset   ??? Cancer Father      died from colon ca.   ??? Cancer Maternal Grandmother      metastatic breast CA.        History     Social History   ??? Marital Status: DIVORCED     Spouse Name: N/A     Number of Children: N/A   ??? Years of  Education: N/A     Occupational History   ??? Not on file.     Social History Main Topics   ??? Smoking status: Never Smoker    ??? Smokeless tobacco: Never Used   ??? Alcohol Use: Yes      Comment: rarely   ??? Drug Use: No      Comment: narcotic seeking per hospital hx.   ??? Sexually Active: Yes -- Female partner(s)     Birth Control/ Protection: Surgical     Other Topics Concern   ??? Not on file     Social History Narrative   ??? No narrative on file                  ALLERGIES: Ativan; Codeine; Iodinated contrast media - iv  dye; Ketorolac tromethamine; Motrin; Pcn; Pneumococcal vaccine; Pneumovax 23; and Shellfish containing products      Review of Systems   Constitutional: Positive for malaise/fatigue. Negative for diaphoresis, fatigue and unexpected weight change.   HENT: Negative for hearing loss, trouble swallowing, neck pain, neck stiffness, voice change and ear discharge.    Eyes: Negative for photophobia, redness and visual disturbance.   Respiratory: Negative for cough, choking, chest tightness and shortness of breath.    Cardiovascular: Negative for palpitations, leg swelling and syncope.   Gastrointestinal: Positive for nausea. Negative for vomiting, abdominal pain and anorexia.   Endocrine: Negative for polydipsia, polyphagia and polyuria.   Genitourinary: Negative for dysuria.   Musculoskeletal: Negative for back pain, joint swelling and gait problem.   Skin: Negative for pallor, rash and wound.   Allergic/Immunologic: Negative for food allergies and immunocompromised state.   Neurological: Positive for headaches. Negative for dizziness, tingling, seizures, weakness, light-headedness and numbness.   Hematological: Negative for adenopathy. Does not bruise/bleed easily.   Psychiatric/Behavioral: Negative for behavioral problems, confusion and decreased concentration.   All other systems reviewed and are negative.        Filed Vitals:    10/30/12 1730 10/30/12 1735 10/30/12 1747 10/30/12 1748   BP: 107/53      Pulse: 78  83 78    Temp:       Resp:    16   Height:       Weight:       SpO2: 94% 96% 97%             Physical Exam   Nursing note and vitals reviewed.  Constitutional: She is oriented to person, place, and time. She appears well-developed and well-nourished. No distress.   HENT:   Head: Normocephalic and atraumatic.   Mouth/Throat: Oropharynx is clear and moist. No oropharyngeal exudate.   Eyes: Conjunctivae and EOM are normal. Pupils are equal, round, and reactive to light. No scleral icterus.   Neck: Normal range of motion. Neck supple. No JVD present. No tracheal deviation present. No thyromegaly present.   Cardiovascular: Normal rate, regular rhythm, normal heart sounds and intact distal pulses.  Exam reveals no gallop and no friction rub.    No murmur heard.  Pulmonary/Chest: Effort normal and breath sounds normal. No stridor. No respiratory distress. She has no wheezes. She has no rales. She exhibits no tenderness.   Abdominal: Soft. Bowel sounds are normal. She exhibits no distension and no mass. There is no tenderness. There is no rebound and no guarding.   Musculoskeletal: Normal range of motion. She exhibits no edema and no tenderness.   Lymphadenopathy:     She has no cervical adenopathy.   Neurological: She is alert and oriented to person, place, and time. She has normal reflexes. She displays normal reflexes. No cranial nerve deficit. She exhibits normal muscle tone. Coordination normal.   Skin: Skin is warm and dry. No rash noted. She is not diaphoretic. No erythema. No pallor.   Psychiatric: She has a normal mood and affect. Her behavior is normal. Judgment and thought content normal.        MDM     Differential Diagnosis; Clinical Impression; Plan:     Pt denies any trauma, no fever, no meningmus. Headaches have been gradual in onset and not associated with any neurological symptoms. Given this presentation I do not feel patients symptoms are secondary to any emergent conditions such as SAH or Cerebral  Venous Thrombosis.  Amount and/or Complexity of Data Reviewed:   Clinical lab tests:  Ordered and reviewed  Tests in the radiology section of CPT??:  Ordered and reviewed  Risk of Significant Complications, Morbidity, and/or Mortality:   Presenting problems:  Moderate  Diagnostic procedures:  Moderate  Management options:  Moderate  Progress:   Patient progress:  Stable      Procedures

## 2012-10-30 NOTE — ED Notes (Signed)
Pt states her pain is down to a 5. Iv fluids running. Pt nad

## 2012-10-30 NOTE — ED Notes (Signed)
Brayton Caves RN in to access port

## 2012-10-30 NOTE — ED Notes (Signed)
Bedside report given to Courtney RN

## 2012-10-30 NOTE — ED Notes (Signed)
Discharge instructions covered verbally.  Pt verbalized understanding.

## 2012-10-30 NOTE — ED Notes (Signed)
Warm blanket given to pt. Light turned off for pt

## 2012-10-30 NOTE — ED Notes (Signed)
Port accessed using sterile technique.

## 2012-10-30 NOTE — ED Notes (Signed)
Patient medicated as ordered.

## 2012-10-30 NOTE — ED Notes (Signed)
Iv fluids still running at shift change

## 2012-11-05 LAB — HCG URINE, QL. - POC: Pregnancy test,urine (POC): NEGATIVE

## 2012-11-05 MED ADMIN — morphine injection 4 mg: INTRAVENOUS | NDC 00409189001

## 2012-11-05 MED ADMIN — ondansetron (ZOFRAN) injection 4 mg: INTRAVENOUS | NDC 23155037831

## 2012-11-05 NOTE — ED Notes (Signed)
I have reviewed discharge instructions with the patient.  The patient verbalized understanding.  Heparin packing placed before CW port access was removed.  Pt dressing and then will ambulate to lobby to call for a ride home, was previously observed walking to bathroom w/o any disturbances.

## 2012-11-05 NOTE — ED Notes (Signed)
Pt in gown / waiting on MD evaluation

## 2012-11-05 NOTE — ED Notes (Signed)
Attempted port access by this nurse without success. Will ask team member to try again.

## 2012-11-05 NOTE — ED Notes (Signed)
Pt states she has right upper stomach pain that started today. She has vomited 2 times today

## 2012-11-05 NOTE — ED Provider Notes (Signed)
HPI Comments: Pt. With RUQ pain into back this AM. Off and on. Constant for past 3 hours. Some N/V with this.     Patient is a 47 y.o. female presenting with abdominal pain. [name] No language interpreter was used.   Abdominal Pain   This is a new problem. The current episode started 6 to 12 hours ago. The problem has been gradually worsening. The pain is associated with an unknown factor. The pain is located in the RUQ. The quality of the pain is sharp and cramping. The pain is moderate. Associated symptoms include nausea and vomiting. Pertinent negatives include no fever, no diarrhea, no melena, no constipation, no dysuria, no hematuria, no headaches, no chest pain and no back pain. The pain is worsened by eating. The pain is relieved by nothing. The patient's surgical history includes appendectomy.       Past Medical History   Diagnosis Date   ??? 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 Factor V Leiden mutation    ??? Chronic kidney disease      kidney stones   ??? Neurological disorder      migraines   ??? Psychiatric disorder      depression   ??? Infectious disease 02/2009      Hx MRSA / E.Coli bacteremia, MRSA wound infection   ??? Thromboembolus      Factor 5, Factor 2   ??? Factor V deficiency    ??? Factor II deficiency    ??? Ill-defined condition      factor 5    ??? Iron deficiency 10/06/2008        Past Surgical History   Procedure Laterality Date   ??? Cystoscopy  ???   ??? Lithotripsy  2006   ??? Hx tonsillectomy     ??? Hx urological       cystoscopex13   ??? Hx appendectomy  ???   ??? Hx cesarean section     ??? Hx tubal ligation     ??? Pr vascular surgery procedure unlist       Port/removal   ??? Hx vascular access  July, 2010     Power Port placed in [place], [place]., removed   ??? Hx vascular access       pt has current rt subclavian port         Family History   Problem Relation Age of Onset   ??? Cancer Father      died from colon ca.   ??? Cancer Maternal Grandmother      metastatic  breast CA.        History     Social History   ??? Marital Status: DIVORCED     Spouse Name: N/A     Number of Children: N/A   ??? Years of Education: N/A     Occupational History   ??? Not on file.     Social History Main Topics   ??? Smoking status: Never Smoker    ??? Smokeless tobacco: Never Used   ??? Alcohol Use: Yes      Comment: rarely   ??? Drug Use: No      Comment: narcotic seeking per hospital hx.   ??? Sexually Active: Yes -- Female partner(s)     Birth Control/ Protection: Surgical     Other Topics Concern   ??? Not on file     Social History Narrative   ???  No narrative on file                  ALLERGIES: Ativan; Codeine; Iodinated contrast media - iv dye; Ketorolac tromethamine; Motrin; Pcn; Pneumococcal vaccine; Pneumovax 23; and Shellfish containing products      Review of Systems   Constitutional: Negative for fever and chills.   HENT: Negative for neck pain and neck stiffness.    Eyes: Negative for pain and redness.   Respiratory: Negative for chest tightness, shortness of breath and wheezing.    Cardiovascular: Negative for chest pain and leg swelling.   Gastrointestinal: Positive for nausea, vomiting and abdominal pain. Negative for diarrhea, constipation and melena.   Genitourinary: Negative for dysuria, hematuria, vaginal bleeding and vaginal discharge.   Musculoskeletal: Negative for back pain and gait problem.   Skin: Negative for color change and rash.   Neurological: Negative for weakness, numbness and headaches.       Filed Vitals:    11/05/12 1730   BP: 141/70   Pulse: 89   Temp: 98.3 ??F (36.8 ??C)   Resp: 18   Height: 5\' 4"  (1.626 m)   Weight: 64.411 kg (142 lb)   SpO2: 99%            Physical Exam   Nursing note and vitals reviewed.  Constitutional: She is oriented to person, place, and time. She appears well-developed and well-nourished. No distress.   HENT:   Head: Normocephalic and atraumatic.   Neck: Normal range of motion. Neck supple.   Cardiovascular: Normal rate and regular rhythm.    No murmur  heard.  Pulmonary/Chest: Effort normal and breath sounds normal. She has no wheezes.   Abdominal: Soft. Bowel sounds are normal. She exhibits no mass. There is tenderness (RUQ). There is guarding. There is no rebound.   Musculoskeletal: Normal range of motion. She exhibits no edema.   Neurological: She is alert and oriented to person, place, and time.   Skin: Skin is warm and dry.        MDM     Risk of Significant Complications, Morbidity, and/or Mortality:   Presenting problems:  Moderate  Diagnostic procedures:  Moderate  Management options:  Moderate  Progress:   Patient progress:  Stable      Procedures    UA negative.     [place] ABD LTD (Final result)  Result time: 11/05/12 20:38:58      Final result by Clenton Pare, MD (11/05/12 20:38:58)      Impression:    IMPRESSION: Negative for gallstones or evidence of biliary tree obstruction. 5  mm stone lower pole right kidney.        Narrative:    RIGHT UPPER QUADRANT ULTRASOUND.    HISTORY: Right upper quadrant abdominal pain.    COMPARISON: None.    FINDINGS: No gallstones. The gallbladder wall is not thickened. The common bile  duct is not dilated, 3 mm. Intrahepatic biliary tree is not dilated. Included  portion of the pancreas and right kidney unremarkable except for a small stone  in the right kidney.             Results Include:    Recent Results (from the past 24 hour(s))   HCG URINE, QL. - POC    Collection Time     11/05/12  7:31 PM       Result Value Range    Pregnancy test,urine (POC) NEGATIVE   NEG     CBC WITH AUTOMATED DIFF  Collection Time     11/05/12  7:43 PM       Result Value Range    WBC 5.8  4.3 - 11.1 K/uL    RBC 3.40 (*) 4.05 - 5.25 M/uL    HGB 10.6 (*) 11.7 - 15.4 g/dL    HCT 16.1 (*) 09.6 - 46.3 %    MCV 95.6  79.6 - 97.8 FL    MCH 31.2  26.1 - 32.9 PG    MCHC 32.6  31.4 - 35.0 g/dL    RDW 04.5 (*) 40.9 - 14.6 %    PLATELET 352  150 - 450 K/uL    MPV 10.3 (*) 10.8 - 14.1 FL    DF AUTOMATED      NEUTROPHILS 57  43 - 78 %    LYMPHOCYTES  29  13 - 44 %    MONOCYTES 8  4.0 - 12.0 %    EOSINOPHILS 5  0.5 - 7.8 %    BASOPHILS 1  0.0 - 2.0 %    IMMATURE GRANULOCYTES 0.2  0.0 - 5.0 %    ABS. NEUTROPHILS 3.3  1.7 - 8.2 K/UL    ABS. LYMPHOCYTES 1.7  0.5 - 4.6 K/UL    ABS. MONOCYTES 0.5  0.1 - 1.3 K/UL    ABS. EOSINOPHILS 0.3  0.0 - 0.8 K/UL    ABS. BASOPHILS 0.0  0.0 - 0.2 K/UL    ABS. IMM. GRANS. 0.0  0.0 - 0.5 K/UL   METABOLIC PANEL, COMPREHENSIVE    Collection Time     11/05/12  7:43 PM       Result Value Range    Sodium 137  136 - 145 mmol/L    Potassium 4.1  3.5 - 5.1 mmol/L    Chloride 104  98 - 107 mmol/L    CO2 27  21 - 32 mmol/L    Anion gap 6 (*) 7 - 16 mmol/L    Glucose 97  65 - 100 mg/dL    BUN 17  6 - 23 MG/DL    Creatinine 8.11  0.6 - 1.0 MG/DL    GFR est AA >91  >47 ml/min/1.13m2    GFR est non-AA >60  >60 ml/min/1.50m2    Calcium 9.0  8.3 - 10.4 MG/DL    Bilirubin, total 0.2  0.2 - 1.1 MG/DL    ALT 17  12 - 65 U/L    AST 17  15 - 37 U/L    Alk. phosphatase 100  50 - 136 U/L    Protein, total 7.2  6.3 - 8.2 g/dL    Albumin 4.0  3.5 - 5.0 g/dL    Globulin 3.2  2.3 - 3.5 g/dL    A-G Ratio 1.3  1.2 - 3.5     LIPASE    Collection Time     11/05/12  7:43 PM       Result Value Range    Lipase 215  73 - 393 U/L

## 2012-11-06 LAB — METABOLIC PANEL, COMPREHENSIVE
A-G Ratio: 1.3 (ref 1.2–3.5)
ALT (SGPT): 17 U/L (ref 12–65)
AST (SGOT): 17 U/L (ref 15–37)
Albumin: 4 g/dL (ref 3.5–5.0)
Alk. phosphatase: 100 U/L (ref 50–136)
Anion gap: 6 mmol/L — ABNORMAL LOW (ref 7–16)
BUN: 17 MG/DL (ref 6–23)
Bilirubin, total: 0.2 MG/DL (ref 0.2–1.1)
CO2: 27 mmol/L (ref 21–32)
Calcium: 9 MG/DL (ref 8.3–10.4)
Chloride: 104 mmol/L (ref 98–107)
Creatinine: 0.8 MG/DL (ref 0.6–1.0)
GFR est AA: >60 mL/min/{1.73_m2} (ref >60)
GFR est non-AA: >60 mL/min/{1.73_m2} (ref >60)
Globulin: 3.2 g/dL (ref 2.3–3.5)
Glucose: 97 mg/dL (ref 65–100)
Potassium: 4.1 mmol/L (ref 3.5–5.1)
Protein, total: 7.2 g/dL (ref 6.3–8.2)
Sodium: 137 mmol/L (ref 136–145)

## 2012-11-06 LAB — CBC WITH AUTOMATED DIFF
ABS. BASOPHILS: 0 10*3/uL (ref 0.0–0.2)
ABS. EOSINOPHILS: 0.3 10*3/uL (ref 0.0–0.8)
ABS. IMM. GRANS.: 0 10*3/uL (ref 0.0–0.5)
ABS. LYMPHOCYTES: 1.7 10*3/uL (ref 0.5–4.6)
ABS. MONOCYTES: 0.5 10*3/uL (ref 0.1–1.3)
ABS. NEUTROPHILS: 3.3 10*3/uL (ref 1.7–8.2)
BASOPHILS: 1 % (ref 0.0–2.0)
EOSINOPHILS: 5 % (ref 0.5–7.8)
HCT: 32.5 % — ABNORMAL LOW (ref 35.8–46.3)
HGB: 10.6 g/dL — ABNORMAL LOW (ref 11.7–15.4)
IMMATURE GRANULOCYTES: 0.2 % (ref 0.0–5.0)
LYMPHOCYTES: 29 % (ref 13–44)
MCH: 31.2 PG (ref 26.1–32.9)
MCHC: 32.6 g/dL (ref 31.4–35.0)
MCV: 95.6 FL (ref 79.6–97.8)
MONOCYTES: 8 % (ref 4.0–12.0)
MPV: 10.3 FL — ABNORMAL LOW (ref 10.8–14.1)
NEUTROPHILS: 57 % (ref 43–78)
PLATELET: 352 10*3/uL (ref 150–450)
RBC: 3.4 M/uL — ABNORMAL LOW (ref 4.05–5.25)
RDW: 14.9 % — ABNORMAL HIGH (ref 11.9–14.6)
WBC: 5.8 10*3/uL (ref 4.3–11.1)

## 2012-11-06 LAB — LIPASE: Lipase: 215 U/L (ref 73–393)

## 2012-11-06 MED ADMIN — heparin (porcine) pf 300 Units: @ 02:00:00

## 2012-11-06 MED ADMIN — sodium chloride 0.9 % bolus infusion 1,000 mL: INTRAVENOUS | @ 01:00:00 | NDC 00409798309

## 2012-11-11 NOTE — ED Provider Notes (Signed)
Patient is a 47 y.o. female presenting with headaches. [name]   Headache   This is a recurrent problem. The current episode started 12 to 24 hours ago. The problem occurs constantly. The problem has not changed since onset.The headache is aggravated by an unknown factor. The pain is located in the right unilateral region. The quality of the pain is described as throbbing. The pain is at a severity of 7/10. Associated symptoms include nausea. Pertinent negatives include no anorexia, no fever, no malaise/fatigue, no chest pressure, no near-syncope, no orthopnea, no palpitations, no syncope, no shortness of breath, no weakness, no tingling, no dizziness, no visual change and no vomiting. She has tried triptan therapy for the symptoms. The treatment provided no relief.        Past Medical History   Diagnosis Date   ??? 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 Factor V Leiden mutation    ??? Chronic kidney disease      kidney stones   ??? Neurological disorder      migraines   ??? Psychiatric disorder      depression   ??? Infectious disease 02/2009      Hx MRSA / E.Coli bacteremia, MRSA wound infection   ??? Thromboembolus      Factor 5, Factor 2   ??? Factor V deficiency    ??? Factor II deficiency    ??? Ill-defined condition      factor 5    ??? Iron deficiency 10/06/2008        Past Surgical History   Procedure Laterality Date   ??? Cystoscopy  ???   ??? Lithotripsy  2006   ??? Hx tonsillectomy     ??? Hx urological       cystoscopex13   ??? Hx appendectomy  ???   ??? Hx cesarean section     ??? Hx tubal ligation     ??? Vascular surgery procedure unlist       Port/removal   ??? Hx vascular access  July, 2010     Power Port placed in [place], [place]., removed   ??? Hx vascular access       pt has current rt subclavian port         Family History   Problem Relation Age of Onset   ??? Cancer Father      died from colon ca.   ??? Cancer Maternal Grandmother      metastatic breast CA.        History     Social History    ??? Marital Status: DIVORCED     Spouse Name: N/A     Number of Children: N/A   ??? Years of Education: N/A     Occupational History   ??? Not on file.     Social History Main Topics   ??? Smoking status: Never Smoker    ??? Smokeless tobacco: Never Used   ??? Alcohol Use: Yes      Comment: rarely   ??? Drug Use: No      Comment: narcotic seeking per hospital hx.   ??? Sexually Active: Yes -- Female partner(s)     Birth Control/ Protection: Surgical     Other Topics Concern   ??? Not on file     Social History Narrative   ??? No narrative on file  ALLERGIES: Ativan; Codeine; Iodinated contrast media - iv dye; Ketorolac tromethamine; Motrin; Pcn; Pneumococcal vaccine; Pneumovax 23; and Shellfish containing products      Review of Systems   Constitutional: Negative for fever, chills and malaise/fatigue.   Respiratory: Negative for shortness of breath.    Cardiovascular: Negative for palpitations, orthopnea, syncope and near-syncope.   Gastrointestinal: Positive for nausea. Negative for vomiting and anorexia.   Neurological: Positive for headaches. Negative for dizziness, tingling and weakness.   All other systems reviewed and are negative.        Filed Vitals:    11/11/12 2019   BP: 94/66   Pulse: 84   Temp: 98.1 ??F (36.7 ??C)   Resp: 16   Height: 5\' 4"  (1.626 m)   Weight: 64.411 kg (142 lb)   SpO2: 100%            Physical Exam   Nursing note and vitals reviewed.  Constitutional: She is oriented to person, place, and time. She appears well-developed and well-nourished. She appears distressed.   HENT:   Head: Normocephalic and atraumatic.   Right Ear: Tympanic membrane and external ear normal.   Left Ear: Tympanic membrane and external ear normal.   Mouth/Throat: Oropharynx is clear and moist.   Eyes: Conjunctivae and EOM are normal. Pupils are equal, round, and reactive to light.   Neck: Normal range of motion. Neck supple. No tracheal deviation present.   Cardiovascular: Normal rate, regular rhythm, normal heart sounds  and intact distal pulses.  Exam reveals no gallop and no friction rub.    No murmur heard.  Pulmonary/Chest: Effort normal and breath sounds normal. No respiratory distress. She has no wheezes.   Abdominal: Soft. Bowel sounds are normal. She exhibits no distension and no mass. There is no tenderness. There is no rebound and no guarding.   Musculoskeletal: Normal range of motion. She exhibits no edema.   Lymphadenopathy:     She has no cervical adenopathy.   Neurological: She is alert and oriented to person, place, and time. She has normal strength. She displays normal reflexes. No cranial nerve deficit or sensory deficit. Coordination normal.   Skin: Skin is warm and dry. No rash noted. She is not diaphoretic. No erythema.   Psychiatric: She has a normal mood and affect. Her speech is normal and behavior is normal. Cognition and memory are normal.        MDM    Procedures

## 2012-11-11 NOTE — ED Notes (Signed)
Patient c/o migraine headache since this AM.  History of same.

## 2012-11-12 MED ADMIN — promethazine (PHENERGAN) injection 25 mg: INTRAMUSCULAR | @ 04:00:00 | NDC 00641092821

## 2012-11-12 MED ADMIN — meperidine (DEMEROL) injection 50 mg: INTRAMUSCULAR | @ 04:00:00 | NDC 00641605301

## 2012-11-12 NOTE — ED Notes (Signed)
Unable to locate patient at this time for reassessment

## 2012-11-12 NOTE — ED Notes (Signed)
Unable to obtain discharge vital signs or provide prescription for patient as patient left prior to official discharge instruction. Unable to reassess patient

## 2012-11-20 LAB — CBC WITH AUTOMATED DIFF
ABS. BASOPHILS: 0 10*3/uL (ref 0.0–0.2)
ABS. EOSINOPHILS: 0.3 10*3/uL (ref 0.0–0.8)
ABS. IMM. GRANS.: 0 10*3/uL (ref 0.0–0.5)
ABS. LYMPHOCYTES: 1.1 10*3/uL (ref 0.5–4.6)
ABS. MONOCYTES: 0.6 10*3/uL (ref 0.1–1.3)
ABS. NEUTROPHILS: 5.1 10*3/uL (ref 1.7–8.2)
BASOPHILS: 0 % (ref 0.0–2.0)
EOSINOPHILS: 4 % (ref 0.5–7.8)
HCT: 29.6 % — ABNORMAL LOW (ref 35.8–46.3)
HGB: 9.9 g/dL — ABNORMAL LOW (ref 11.7–15.4)
IMMATURE GRANULOCYTES: 0.1 % (ref 0.0–5.0)
LYMPHOCYTES: 15 % (ref 13–44)
MCH: 31.3 PG (ref 26.1–32.9)
MCHC: 33.4 g/dL (ref 31.4–35.0)
MCV: 93.7 FL (ref 79.6–97.8)
MONOCYTES: 8 % (ref 4.0–12.0)
MPV: 9.9 FL — ABNORMAL LOW (ref 10.8–14.1)
NEUTROPHILS: 73 % (ref 43–78)
PLATELET: 225 10*3/uL (ref 150–450)
RBC: 3.16 M/uL — ABNORMAL LOW (ref 4.05–5.25)
RDW: 15 % — ABNORMAL HIGH (ref 11.9–14.6)
WBC: 7.1 10*3/uL (ref 4.3–11.1)

## 2012-11-20 LAB — POC TROPONIN: Troponin-I (POC): 0 ng/ml (ref 0.0–0.08)

## 2012-11-20 LAB — METABOLIC PANEL, COMPREHENSIVE
A-G Ratio: 1.1 — ABNORMAL LOW (ref 1.2–3.5)
ALT (SGPT): 41 U/L (ref 12–65)
AST (SGOT): 35 U/L (ref 15–37)
Albumin: 3.5 g/dL (ref 3.5–5.0)
Alk. phosphatase: 92 U/L (ref 50–136)
Anion gap: 8 mmol/L (ref 7–16)
BUN: 17 MG/DL (ref 6–23)
Bilirubin, total: 0.2 MG/DL (ref 0.2–1.1)
CO2: 25 mmol/L (ref 21–32)
Calcium: 9.1 MG/DL (ref 8.3–10.4)
Chloride: 105 mmol/L (ref 98–107)
Creatinine: 0.96 MG/DL (ref 0.6–1.0)
GFR est AA: >60 mL/min/{1.73_m2} (ref >60)
GFR est non-AA: >60 mL/min/{1.73_m2} (ref >60)
Globulin: 3.3 g/dL (ref 2.3–3.5)
Glucose: 105 mg/dL — ABNORMAL HIGH (ref 65–100)
Potassium: 4 mmol/L (ref 3.5–5.1)
Protein, total: 6.8 g/dL (ref 6.3–8.2)
Sodium: 138 mmol/L (ref 136–145)

## 2012-11-20 NOTE — ED Notes (Signed)
Patient reports cough, and shortness of breath and chest pain since 1200. States has been coughing up green mucus.

## 2012-11-20 NOTE — ED Notes (Signed)
Pt requesting pain medication. Explained that she did not have a ohysician assigned yet. Pt sts she was seen by "a tall blonde man doctor" assured pt I would look and would ask her doctor as soon as I saw that physician.

## 2012-11-20 NOTE — ED Notes (Signed)
Pt called nurse to room sts "I got your urine specimen and pulled that needle out" pt noted to have huber needle on mayo stand next to bed. Pt sts she is leaving she has family at home that needs her. Dr Lou Miner made aware,

## 2012-11-20 NOTE — ED Notes (Signed)
Pt called to desk sts " I am ready to go home, if he is

## 2012-11-20 NOTE — ED Provider Notes (Signed)
Patient is a 47 y.o. female presenting with abdominal pain. [name]   Abdominal Pain   This is a recurrent (hx of multiple ED visits for various pain related etiologies.) problem. Episode onset: Pt has LLQ pain that has been bothering her for today she stated it shot up into her chest.   The problem has not changed since onset.The pain is located in the LLQ. The pain is moderate. Associated symptoms include vomiting. Pertinent negatives include no fever, no diarrhea and no chest pain. Associated symptoms comments: Pt denies being sexually active in many months.    .        Past Medical History   Diagnosis Date   ??? 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 Factor V Leiden mutation    ??? Chronic kidney disease      kidney stones   ??? Neurological disorder      migraines   ??? Psychiatric disorder      depression   ??? Infectious disease 02/2009      Hx MRSA / E.Coli bacteremia, MRSA wound infection   ??? Thromboembolus      Factor 5, Factor 2   ??? Factor V deficiency    ??? Factor II deficiency    ??? Iron deficiency 10/06/2008   ??? Ill-defined condition      factor 5 and factor 2        Past Surgical History   Procedure Laterality Date   ??? Cystoscopy  ???   ??? Lithotripsy  2006   ??? Hx tonsillectomy     ??? Hx urological       cystoscopex13   ??? Hx appendectomy  ???   ??? Hx cesarean section     ??? Hx tubal ligation     ??? Vascular surgery procedure unlist       Port/removal   ??? Hx vascular access  July, 2010     Power Port placed in [place], [place]., removed   ??? Hx vascular access       pt has current rt subclavian port         Family History   Problem Relation Age of Onset   ??? Cancer Father      died from colon ca.   ??? Cancer Maternal Grandmother      metastatic breast CA.        History     Social History   ??? Marital Status: DIVORCED     Spouse Name: N/A     Number of Children: N/A   ??? Years of Education: N/A     Occupational History   ??? Not on file.     Social History Main Topics   ??? Smoking  status: Never Smoker    ??? Smokeless tobacco: Never Used   ??? Alcohol Use: Yes      Comment: rarely   ??? Drug Use: No      Comment: narcotic seeking per hospital hx.   ??? Sexually Active: Yes -- Female partner(s)     Birth Control/ Protection: Surgical     Other Topics Concern   ??? Not on file     Social History Narrative   ??? No narrative on file                  ALLERGIES: Ativan; Codeine; Iodinated contrast media - iv dye; Ketorolac tromethamine; Motrin; Pcn; Pneumococcal vaccine; Pneumovax 23; and Shellfish  containing products      Review of Systems   Constitutional: Negative for fever and chills.   Respiratory: Negative for chest tightness, shortness of breath and wheezing.    Cardiovascular: Negative for chest pain and palpitations.   Gastrointestinal: Positive for vomiting and abdominal pain. Negative for diarrhea.   Skin: Negative.    All other systems reviewed and are negative.        Filed Vitals:    11/20/12 1858 11/20/12 1916   BP: 122/67    Pulse: 91    Temp: 98.5 ??F (36.9 ??C)    Resp: 16    Height: 5\' 4"  (1.626 m)    Weight: 64.411 kg (142 lb)    SpO2: 98% 98%            Physical Exam   Nursing note and vitals reviewed.  Constitutional: She is oriented to person, place, and time. She appears well-developed and well-nourished. No distress.   HENT:   Head: Normocephalic and atraumatic.   Eyes: Conjunctivae are normal. No scleral icterus.   Neck: Normal range of motion. Neck supple.   Cardiovascular: Normal rate, regular rhythm and normal heart sounds.    Pulmonary/Chest: Effort normal and breath sounds normal. No stridor. No respiratory distress. She has no wheezes. She has no rales.   Abdominal: Soft. There is tenderness (LLQ). There is no rebound and no guarding.   Neurological: She is alert and oriented to person, place, and time.   No focal weakness   Skin: Skin is warm and dry. No rash noted. She is not diaphoretic.   Psychiatric: She has a normal mood and affect. Her behavior is normal.        MDM      Differential Diagnosis; Clinical Impression; Plan:     Pt has recurrent chronic pain symptoms.  She has normal vitals and benign exam.    Amount and/or Complexity of Data Reviewed:   Clinical lab tests:  Ordered and reviewed (Results for orders placed during the hospital encounter of 11/20/12    -CBC WITH AUTOMATED DIFF     WBC K/uL                      7.1     Range: 4.3 - 11.1 K/uL     RBC M/uL                      3.16 (*)Range: 4.05 - 5.25 M/uL     HGB g/dL                      9.9 (*) Range: 11.7 - 15.4 g/dL     HCT %                         29.6 (*)Range: 35.8 - 46.3 %     MCV FL                        93.7    Range: 79.6 - 97.8 FL     MCH PG                        31.3    Range: 26.1 - 32.9 PG     MCHC g/dL                     09.8  Range: 31.4 - 35.0 g/dL     RDW %                         15.0 (*)Range: 11.9 - 14.6 %     PLATELET K/uL                 225     Range: 150 - 450 K/uL     MPV FL                        9.9 (*) Range: 10.8 - 14.1 FL     DF                            AUTOMATED                NEUTROPHILS %                 73      Range: 43 - 78 %     LYMPHOCYTES %                 15      Range: 13 - 44 %     MONOCYTES %                   8       Range: 4.0 - 12.0 %     EOSINOPHILS %                 4       Range: 0.5 - 7.8 %     BASOPHILS %                   0       Range: 0.0 - 2.0 %     IMMATURE GRANULOCYTES %       0.1     Range: 0.0 - 5.0 %     ABS. NEUTROPHILS K/UL         5.1     Range: 1.7 - 8.2 K/UL     ABS. LYMPHOCYTES K/UL         1.1     Range: 0.5 - 4.6 K/UL     ABS. MONOCYTES K/UL           0.6     Range: 0.1 - 1.3 K/UL     ABS. EOSINOPHILS K/UL         0.3     Range: 0.0 - 0.8 K/UL     ABS. BASOPHILS K/UL           0.0     Range: 0.0 - 0.2 K/UL     ABS. IMM. GRANS. K/UL         0.0     Range: 0.0 - 0.5 K/UL    -METABOLIC PANEL, COMPREHENSIVE     Sodium mmol/L                 138     Range: 136 - 145 mmol/L     Potassium mmol/L              4.0     Range: 3.5 - 5.1 mmol/L     Chloride  mmol/L               105     Range: 98 - 107  mmol/L     CO2 mmol/L                    25      Range: 21 - 32 mmol/L     Anion gap mmol/L              8       Range: 7 - 16 mmol/L     Glucose mg/dL                 027 (*) Range: 65 - 100 mg/dL     BUN MG/DL                     17      Range: 6 - 23 MG/DL     Creatinine MG/DL              2.53    Range: 0.6 - 1.0 MG/DL     GFR est AA GU/YQI/3.47Q2      >60     Range: >60 ml/min/1.39m2     GFR est non-AA ml/min/1.46m2  >60     Range: >60 ml/min/1.49m2     Calcium MG/DL                 9.1     Range: 8.3 - 10.4 MG/DL     Bilirubin, total MG/DL        0.2     Range: 0.2 - 1.1 MG/DL     ALT U/L                       41      Range: 12 - 65 U/L     AST U/L                       35      Range: 15 - 37 U/L     Alk. phosphatase U/L          92      Range: 50 - 136 U/L     Protein, total g/dL           6.8     Range: 6.3 - 8.2 g/dL     Albumin g/dL                  3.5     Range: 3.5 - 5.0 g/dL     Globulin g/dL                 3.3     Range: 2.3 - 3.5 g/dL     A-G Ratio                     1.1 (*) Range: 1.2 - 3.5      -POC TROPONIN-I     Troponin-I (POC) ng/ml        0       Range: 0.0 - 0.08 ng/ml )  Tests in the radiology section of CPT??:  Ordered and reviewed      Procedures

## 2012-11-20 NOTE — ED Notes (Addendum)
Pt noted to walk from department without incident at this time. Pt left department prior to signing discharge papers or taking prescription.

## 2012-11-20 NOTE — ED Notes (Signed)
I have NOT reviewed discharge instructions with the patient.  The patient did not verbalize understanding because she walked from department prior to getting discharge instructions or prescription. Pt sts "I have antibiotics at home"

## 2012-11-21 LAB — EKG, 12 LEAD, INITIAL
Atrial Rate: 89 {beats}/min
Calculated P Axis: 54 degrees
Calculated R Axis: 78 degrees
Calculated T Axis: 54 degrees
P-R Interval: 166 ms
Q-T Interval: 336 ms
QRS Duration: 90 ms
QTC Calculation (Bezet): 408 ms
Ventricular Rate: 89 {beats}/min

## 2012-11-21 MED ADMIN — hydrocodone-acetaminophen (NORCO) 5-325 mg per tablet 1 tablet: ORAL | @ 01:00:00 | NDC 68084036811

## 2012-12-01 NOTE — ED Notes (Signed)
Cracked a tooth on Saturday and has lead to a headache today.

## 2012-12-01 NOTE — ED Provider Notes (Signed)
HPI Comments: Patient is here with left upper anterior molar pain after cracking a tooth on Saturday, 5 days ago. The pain is getting worse. She has a little swelling to the gum. No fever. She is hurting on that side of her head.    Patient is a 47 y.o. female presenting with dental problem. [name]   Dental Pain   This is a new problem. Episode onset: 5 days now. The problem occurs constantly. The problem has been gradually worsening. The pain is located in the left upper mouth.The quality of the pain is aching.  The pain is at a severity of 8/10. The pain is severe. Associated symptoms include gum redness.There was no vomiting, no nausea, no fever, no swelling, no chest pain, no shortness of breath, no headaches and no drainage. Treatments tried: fioricet. The patient has no cardiac history.       Past Medical History   Diagnosis Date   ??? 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 Factor V Leiden mutation    ??? Chronic kidney disease      kidney stones   ??? Neurological disorder      migraines   ??? Psychiatric disorder      depression   ??? Infectious disease 02/2009      Hx MRSA / E.Coli bacteremia, MRSA wound infection   ??? Thromboembolus      Factor 5, Factor 2   ??? Factor V deficiency    ??? Factor II deficiency    ??? Iron deficiency 10/06/2008   ??? Ill-defined condition      factor 5 and factor 2        Past Surgical History   Procedure Laterality Date   ??? Cystoscopy  ???   ??? Lithotripsy  2006   ??? Hx tonsillectomy     ??? Hx urological       cystoscopex13   ??? Hx appendectomy  ???   ??? Hx cesarean section     ??? Hx tubal ligation     ??? Vascular surgery procedure unlist       Port/removal   ??? Hx vascular access  July, 2010     Power Port placed in [place], [place]., removed   ??? Hx vascular access       pt has current rt subclavian port         Family History   Problem Relation Age of Onset   ??? Cancer Father      died from colon ca.   ??? Cancer Maternal Grandmother      metastatic  breast CA.        History     Social History   ??? Marital Status: DIVORCED     Spouse Name: N/A     Number of Children: N/A   ??? Years of Education: N/A     Occupational History   ??? Not on file.     Social History Main Topics   ??? Smoking status: Never Smoker    ??? Smokeless tobacco: Never Used   ??? Alcohol Use: Yes      Comment: rarely   ??? Drug Use: No      Comment: narcotic seeking per hospital hx.   ??? Sexually Active: Yes -- Female partner(s)     Birth Control/ Protection: Surgical     Other Topics Concern   ??? Not on file     Social History  Narrative   ??? No narrative on file                  ALLERGIES: Ativan; Codeine; Iodinated contrast media - iv dye; Ketorolac tromethamine; Motrin; Pcn; Pneumococcal vaccine; Pneumovax 23; and Shellfish containing products      Review of Systems   Constitutional: Negative.    HENT: Positive for dental problem.    Eyes: Negative.    Respiratory: Negative.    Cardiovascular: Negative.    Gastrointestinal: Negative.    Genitourinary: Negative.    Musculoskeletal: Negative.    Skin: Negative.    Neurological: Negative.    Psychiatric/Behavioral: Negative.    All other systems reviewed and are negative.        Filed Vitals:    12/01/12 1531   BP: 150/81   Pulse: 86   Temp: 98.9 ??F (37.2 ??C)   Resp: 16   Height: 5\' 4"  (1.626 m)   Weight: 63.05 kg (139 lb)   SpO2: 95%            Physical Exam   Nursing note and vitals reviewed.  Constitutional: She is oriented to person, place, and time. She appears well-developed and well-nourished.   HENT:   Head: Normocephalic and atraumatic.   Right Ear: External ear normal.   Left Ear: External ear normal.   Nose: Nose normal.   Mouth/Throat: Uvula is midline. Abnormal dentition. Dental abscesses and dental caries present. No edematous.       Eyes: Conjunctivae and EOM are normal. Pupils are equal, round, and reactive to light.   Neck: Normal range of motion. Neck supple.   Cardiovascular: Normal rate, regular rhythm, normal heart sounds and intact  distal pulses.    Pulmonary/Chest: Effort normal and breath sounds normal.   Abdominal: Soft. Bowel sounds are normal.   Musculoskeletal: Normal range of motion.   Neurological: She is alert and oriented to person, place, and time. She has normal reflexes.   Skin: Skin is warm and dry.   Psychiatric: She has a normal mood and affect. Her behavior is normal. Judgment and thought content normal.        MDM    Procedures    The patient was observed in the ED.  Medication as directed. Patient states she has an appointment with her dentist on October 30 and will follow up with him.  I discussed the results of all labs, procedures, radiographs, and treatments with the patient and available family.  Treatment plan is agreed upon and the patient is ready for discharge.  All voiced understanding of the discharge plan and medication instructions or changes as appropriate.  Questions about treatment in the ED were answered.  All were encouraged to return should symptoms worsen or new problems develop.

## 2012-12-01 NOTE — ED Provider Notes (Signed)
I was personally available for consultation in the emergency department.  I have reviewed the chart and agree with the documentation recorded by the MLP, including the assessment, treatment plan, and disposition.  Shirlene Andaya S Dial III, MD

## 2012-12-01 NOTE — ED Notes (Signed)
I have reviewed discharge instructions with the patient. Prescription was given.  The patient verbalized understanding. Pt discharged via ambulatory . Pt in no distress at time of discharge.

## 2012-12-06 NOTE — Progress Notes (Signed)
[facility]  [address]   [place], [place] [address]  Phone: [phone] Fax [phone]  Irma Newness, D.O.  12/06/2012   Chief Complaint   Patient presents with   ??? New Patient   ??? Medication Refill           HISTORY OF PRESENT ILLNESS  Krista Lopez is a 47 y.o. female.  HPI Comments:   Pt is here to establish care. She was being followed by [facility] in [place] by Dr. Sanda Klein. States that it was too much traveling for her and she would like to change PCP's. Pt would like refills on her medication especially her klonipin and imitrex as she is running out. States that she takes this for anxiety. She states that she is taking her medication without any difficulty. She states that she has not seen a psychiatrist for many years. Has been hospitalized due to her bipolar in the past. She denies any episodes of depression or mania here today. She denies any SI or HI here today.      Current Problem List  Patient Active Problem List   Diagnosis Code   ??? 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 Pleuritic chest pain 786.52   ??? 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 Depression 311   ??? Anxiety 300.00   ??? Patient noncompliance V15.81   ??? Iron deficiency 280.9   ??? Nephrolithiasis 592.0   ??? Pulmonary embolism 415.19   ??? Delirium, acute 780.09   ??? Tachycardia 785.0   ??? Bipolar 2 disorder 296.89   ??? Migraine 346.90   ??? Restless leg syndrome 333.94   ??? Iron deficiency anemia 280.9   ??? Serotonin syndrome, most likely 333.99   ??? 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 Blood bacterial culture positive - 1 of 2 with GNR (from port - Gm stain negative) 790.7   ??? Pneumonia, organism unspecified 486   ??? Other dyspnea and respiratory abnormality 786.09        Past Medical History  Past Medical History   Diagnosis Date   ??? 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 Factor V Leiden mutation    ??? Chronic kidney disease      kidney stones   ???  Neurological disorder      migraines   ??? Psychiatric disorder      depression   ??? Infectious disease 02/2009      Hx MRSA / E.Coli bacteremia, MRSA wound infection   ??? Thromboembolus      Factor 5, Factor 2   ??? Factor V deficiency    ??? Factor II deficiency    ??? Iron deficiency 10/06/2008   ??? Ill-defined condition      factor 5 and factor 2   ??? Depression    ??? Calculus of kidney    ??? Headache        Current Medication List  Outpatient Encounter Prescriptions as of 12/06/2012   Medication Sig Dispense Refill   ??? clindamycin (CLEOCIN) 150 mg capsule Take 2 capsules by mouth every six (6) hours for 10 days.  80 capsule  0   ??? warfarin (COUMADIN) 4 mg tablet Take 5 mg by mouth daily.       ??? QUEtiapine (SEROQUEL) 100 mg tablet Take 300 mg by mouth nightly.       ??? DULoxetine (CYMBALTA) 60 mg capsule  Take 90 mg by mouth two (2) times a day.       ??? SUMATRIPTAN SUCCINATE (IMITREX PO) Take 100 mg by mouth as needed.       ??? KLONOPIN 1 mg Tab Take  by mouth three (3) times daily.       ??? [DISCONTINUED] tramadol (ULTRAM) 50 mg tablet Take 1 tablet by mouth every six (6) hours as needed for Pain.  20 tablet  0   ??? [DISCONTINUED] tramadol (ULTRAM) 50 mg tablet Take 1 tablet by mouth every six (6) hours as needed for Pain.  15 tablet  0     No facility-administered encounter medications on file as of 12/06/2012.       Allergies  Allergies   Allergen Reactions   ??? Ativan [Lorazepam] Itching   ??? Codeine Rash   ??? Iodinated Contrast Media - Iv Dye Rash     States also has swelling, and allergic to shellfish   ??? Ketorolac Tromethamine Rash   ??? Motrin [Ibuprofen] Other (comments)     Causes stomach upset after 3 doses.   ??? Pcn [Penicillins] Hives   ??? Pneumococcal Vaccine Swelling   ??? Pneumovax 23 [Pneumococcal 23-Valps Vaccine] Other (comments)     Local reaction   ??? Shellfish Containing Products Rash       Family History  Family History   Problem Relation Age of Onset   ??? Cancer Father      died from colon ca.   ??? Cancer Maternal  Grandmother      metastatic breast CA.       Social History  History     Social History   ??? Marital Status: DIVORCED     Spouse Name: N/A     Number of Children: N/A   ??? Years of Education: N/A     Social History Main Topics   ??? Smoking status: Never Smoker    ??? Smokeless tobacco: Never Used   ??? Alcohol Use: Yes      Comment: rarely   ??? Drug Use: No      Comment: narcotic seeking per hospital hx.   ??? Sexually Active: Yes -- Female partner(s)     Birth Control/ Protection: Surgical     Other Topics Concern   ??? Not on file     Social History Narrative   ??? No narrative on file     PHQ 2 / 9, over the last two weeks 12/06/2012   Little interest or pleasure in doing things Several days   Feeling down, depressed or hopeless Several days   Total Score PHQ 2 2            Review of Systems   Constitutional: Negative for fever (toothache), chills, weight loss, malaise/fatigue and diaphoresis.   HENT: Negative for hearing loss, ear pain, nosebleeds, congestion, sore throat, neck pain, tinnitus and ear discharge.    Eyes: Negative for blurred vision, double vision, photophobia, pain, discharge and redness.   Respiratory: Negative for cough, hemoptysis, sputum production, shortness of breath, wheezing and stridor.    Cardiovascular: Negative for chest pain, palpitations, orthopnea, claudication, leg swelling and PND.   Gastrointestinal: Positive for nausea. Negative for heartburn, vomiting, abdominal pain, diarrhea, constipation, blood in stool and melena.   Genitourinary: Negative for dysuria, urgency, frequency, hematuria and flank pain.   Musculoskeletal: Negative for myalgias, back pain, joint pain and falls.   Skin: Negative for itching and rash.   Neurological: Negative for dizziness, tingling,  tremors, sensory change, speech change, focal weakness, seizures, loss of consciousness, weakness and headaches.   Endo/Heme/Allergies: Negative for environmental allergies and polydipsia. Does not bruise/bleed easily.    Psychiatric/Behavioral: Negative for depression, suicidal ideas, hallucinations, memory loss and substance abuse. The patient has insomnia. The patient is not nervous/anxious.      BP 118/80   Pulse 92   Temp(Src) 98.4 ??F (36.9 ??C) (Oral)   Resp 17   Ht 5\' 4"  (1.626 m)   Wt 136 lb 3.2 oz (61.78 kg)   BMI 23.37 kg/m2   SpO2 98%   LMP 07/28/2004    Physical Exam   Vitals reviewed.  Constitutional: She is oriented to person, place, and time. She appears well-developed and well-nourished.   HENT:   Head: Normocephalic and atraumatic.   Right Ear: External ear normal.   Left Ear: External ear normal.   Nose: Nose normal.   Mouth/Throat: Oropharynx is clear and moist.   Eyes: EOM are normal. Pupils are equal, round, and reactive to light.   Cardiovascular: Normal rate, regular rhythm, normal heart sounds and intact distal pulses.    Pulmonary/Chest: Effort normal and breath sounds normal.   Abdominal: Soft. Bowel sounds are normal. She exhibits no distension. There is no tenderness.   Neurological: She is alert and oriented to person, place, and time.   Skin: Skin is warm and dry.   Psychiatric: She has a normal mood and affect. Her behavior is normal. Judgment and thought content normal.       ASSESSMENT and PLAN    ICD-9-CM    1. Bipolar 2 disorder 296.89 REFERRAL TO PSYCHIATRY     duloxetine (CYMBALTA) 30 mg capsule     quetiapine (SEROQUEL) 300 mg tablet   2. Migraine 346.90 SUMAtriptan (IMITREX) 100 mg tablet   3. Screening for lipoid disorders V77.91 NMR LIPOPROFILE W/IR MARKERS   1. Bipolar 2 disorder  New. Referral for psych made. Refilled pts medication.  Counsled patient to watch for potential side effects of this new medication including insomina, rashes, headahces, joint and muscle pain, stomach upset, nausea, diarrhea, and sexual disinterest, or increased risk of suicidal thoughts.       - REFERRAL TO PSYCHIATRY  - duloxetine (CYMBALTA) 30 mg capsule; Take 3 capsules by mouth daily. Indications: Bipolar  Disorder  Dispense: 90 capsule; Refill: 1  - quetiapine (SEROQUEL) 300 mg tablet; Take 1 tablet by mouth nightly.  Dispense: 30 tablet; Refill: 1    2. Migraine  New. Stable. RF given.    - SUMAtriptan (IMITREX) 100 mg tablet; Take 1 tablet by mouth once as needed (can repeat in 1 hour if needed.) for up to 1 dose.  Dispense: 15 tablet; Refill: 1    3. Screening for lipoid disorders  Will screen and bring back for lab review. All other lab work is recent from recent ER visits.  - NMR LIPOPROFILE W/IR MARKERS; Future    Over 50% of today's office visit was spent in face to face time reviewing test results, prognosis, importance of compliance, education about disease process, benefits of medications, instructions for management of acute flare-ups, and follow up plans.  Total face to face time spent with patient was 45 minutes.    Follow-up Disposition:  Return in about 3 months (around 03/08/2013) for follow up.  Kadince Boxley S Carleen Rhue, DO

## 2012-12-06 NOTE — Patient Instructions (Signed)
Take all medications as prescribed.    The list of medications listed above are the only medications you should be taking.    Read over the list and make sure that you have all of the medications.    Throw away any medications not on the list.  Bring all medications you are taking to each office visit.    Call for refills before you run out of medications.    Your health is very important to [place]. Thank you for letting us participate in your care.   Schedule an appointment in 2 months for follow up  Schedule Well Woman exam  Schedule medicare wellness exam  Please keep your appointment as scheduled.  If you have any questions or concerns please call the office or make an appointment. If urgent, please seek immediate attention

## 2012-12-27 NOTE — ED Notes (Signed)
Patient c/o left foot pain x 1 week after falling.

## 2012-12-27 NOTE — ED Provider Notes (Addendum)
Patient is a 47 y.o. female presenting with foot pain. [name]   Foot Pain   This is a new problem. The current episode started more than 1 week ago. The problem occurs constantly. The problem has been gradually improving. Associated symptoms include numbness, limited range of motion and stiffness. The symptoms are aggravated by standing and activity. She has tried nothing for the symptoms. The treatment provided no relief. There has been a history of trauma (Patient fell down 4 stairs on heels about a week ago and has left foot pain).        Past Medical History   Diagnosis Date   ??? Factor V Leiden mutation    ??? Infectious disease 02/2009      Hx MRSA / E.Coli bacteremia, MRSA wound infection 2/2 port   ??? Factor II deficiency    ??? Calculus of kidney    ??? Depression    ??? Iron deficiency    ??? Migraines    ??? Pulmonary embolism Multiple episodes     x 16 events - recent hospitalizations 3/14        Past Surgical History   Procedure Laterality Date   ??? Cystoscopy  ???     x 13   ??? Lithotripsy  2006   ??? Hx vascular access  July, 2010     Power Port placed in [place], [place]., removed   ??? Hx vascular access Right 2013     pt has current rt subclavian port - placed at [facility]   ??? Hx appendectomy  ???   ??? Hx tonsillectomy     ??? Hx cesarean section     ??? Hx tubal ligation           Family History   Problem Relation Age of Onset   ??? Colon Cancer Father 57     Passed away due to ca   ??? Bleeding Prob Father      Factor V   ??? Cancer Maternal Grandmother      metastatic breast CA.   ??? Ovarian Cancer Mother 11     passed away due to ca   ??? Bipolar Disorder Mother    ??? Kidney Disease Brother      kidney stones        History     Social History   ??? Marital Status: DIVORCED     Spouse Name: N/A     Number of Children: N/A   ??? Years of Education: N/A     Occupational History   ??? Not on file.     Social History Main Topics   ??? Smoking status: Never Smoker    ??? Smokeless tobacco: Never Used   ???  Alcohol Use: Yes      Comment: rarely   ??? Drug Use: No      Comment: narcotic seeking per hospital hx.   ??? Sexually Active: Yes -- Female partner(s)     Birth Control/ Protection: Surgical     Other Topics Concern   ??? Not on file     Social History Narrative   ??? No narrative on file                  ALLERGIES: Ativan; Codeine; Iodinated contrast media - iv dye; Ketorolac tromethamine; Motrin; Pcn; Pneumococcal vaccine; Pneumovax 23; and Shellfish containing products      Review of Systems   Constitutional: Negative.    HENT: Negative.  Eyes: Negative.    Respiratory: Negative.    Cardiovascular: Negative.    Gastrointestinal: Negative.    Genitourinary: Negative.    Musculoskeletal: Positive for stiffness.        Left foot pain     Skin: Negative.    Neurological: Positive for numbness.   Psychiatric/Behavioral: Negative.    All other systems reviewed and are negative.        Filed Vitals:    12/27/12 1355   Pulse: 100   Temp: 98.8 ??F (37.1 ??C)   Resp: 20   Height: 5' 4.5" (1.638 m)   Weight: 62.596 kg (138 lb)   SpO2: 94%            Physical Exam   Nursing note and vitals reviewed.  Constitutional: She is oriented to person, place, and time. She appears well-developed and well-nourished.   HENT:   Head: Normocephalic and atraumatic.   Right Ear: External ear normal.   Left Ear: External ear normal.   Nose: Nose normal.   Mouth/Throat: Oropharynx is clear and moist.   Eyes: Conjunctivae and EOM are normal. Pupils are equal, round, and reactive to light.   Neck: Normal range of motion. Neck supple.   Cardiovascular: Normal rate, regular rhythm, normal heart sounds and intact distal pulses.    Pulmonary/Chest: Effort normal and breath sounds normal.   Abdominal: Soft. Bowel sounds are normal.   Musculoskeletal:        Left foot: She exhibits decreased range of motion, tenderness, bony tenderness and swelling. She exhibits normal capillary refill, no crepitus, no deformity and no laceration.        Feet:     Neurological: She is alert and oriented to person, place, and time. She has normal reflexes.   Skin: Skin is warm and dry.   Psychiatric: She has a normal mood and affect. Her behavior is normal. Judgment and thought content normal.        MDM    Procedures    The patient was observed in the ED.    Results Reviewed:  XR foot left: Impression: No acute bony abnormality.  Rest, ice, elevate, avoid painful weight bearing. Crutches as needed, ace wrap and post op shoe.  I discussed the results of all labs, procedures, radiographs, and treatments with the patient and available family.  Treatment plan is agreed upon and the patient is ready for discharge.  All voiced understanding of the discharge plan and medication instructions or changes as appropriate.  Questions about treatment in the ED were answered.  All were encouraged to return should symptoms worsen or new problems develop.

## 2012-12-27 NOTE — ED Provider Notes (Signed)
I was personally available for consultation in the emergency department.  I have reviewed the chart and agree with the documentation recorded by the MLP, including the assessment, treatment plan, and disposition.  Aodhan Scheidt M Arieh Bogue, MD

## 2012-12-27 NOTE — ED Notes (Signed)
I have reviewed discharge instructions with the patient.  The patient verbalized understanding. Pt ambulatory to car.

## 2013-01-11 LAB — CBC WITH AUTOMATED DIFF
ABS. BASOPHILS: 0 10*3/uL (ref 0.0–0.2)
ABS. EOSINOPHILS: 0.1 10*3/uL (ref 0.0–0.8)
ABS. IMM. GRANS.: 0 10*3/uL (ref 0.0–0.5)
ABS. LYMPHOCYTES: 1.1 10*3/uL (ref 0.5–4.6)
ABS. MONOCYTES: 0.3 10*3/uL (ref 0.1–1.3)
ABS. NEUTROPHILS: 2.2 10*3/uL (ref 1.7–8.2)
BASOPHILS: 1 % (ref 0.0–2.0)
EOSINOPHILS: 3 % (ref 0.5–7.8)
HCT: 34.6 % — ABNORMAL LOW (ref 35.8–46.3)
HGB: 11.7 g/dL (ref 11.7–15.4)
IMMATURE GRANULOCYTES: 0 % (ref 0.0–5.0)
LYMPHOCYTES: 29 % (ref 13–44)
MCH: 31.3 PG (ref 26.1–32.9)
MCHC: 33.8 g/dL (ref 31.4–35.0)
MCV: 92.5 FL (ref 79.6–97.8)
MONOCYTES: 8 % (ref 4.0–12.0)
MPV: 10.7 FL — ABNORMAL LOW (ref 10.8–14.1)
NEUTROPHILS: 59 % (ref 43–78)
PLATELET: 250 10*3/uL (ref 150–450)
RBC: 3.74 M/uL — ABNORMAL LOW (ref 4.05–5.25)
RDW: 13.2 % (ref 11.9–14.6)
WBC: 3.7 10*3/uL — ABNORMAL LOW (ref 4.3–11.1)

## 2013-01-11 LAB — METABOLIC PANEL, BASIC
Anion gap: 6 mmol/L — ABNORMAL LOW (ref 7–16)
BUN: 13 MG/DL (ref 6–23)
CO2: 26 mmol/L (ref 21–32)
Calcium: 9.5 MG/DL (ref 8.3–10.4)
Chloride: 109 mmol/L — ABNORMAL HIGH (ref 98–107)
Creatinine: 0.8 MG/DL (ref 0.6–1.0)
GFR est AA: >60 mL/min/{1.73_m2} (ref >60)
GFR est non-AA: >60 mL/min/{1.73_m2} (ref >60)
Glucose: 94 mg/dL (ref 65–100)
Potassium: 4.2 mmol/L (ref 3.5–5.1)
Sodium: 141 mmol/L (ref 136–145)

## 2013-01-11 LAB — EKG, 12 LEAD, INITIAL
Atrial Rate: 86 {beats}/min
Calculated P Axis: 76 degrees
Calculated R Axis: 85 degrees
Calculated T Axis: 72 degrees
Diagnosis: NORMAL
P-R Interval: 168 ms
Q-T Interval: 378 ms
QRS Duration: 84 ms
QTC Calculation (Bezet): 452 ms
Ventricular Rate: 86 {beats}/min

## 2013-01-11 LAB — PROTHROMBIN TIME + INR
INR: 4.6 — CR (ref 0.9–1.2)
Prothrombin time: 51.5 s — ABNORMAL HIGH (ref 9.6–12.0)

## 2013-01-11 MED ADMIN — ondansetron (ZOFRAN) injection 4 mg: INTRAVENOUS | @ 20:00:00 | NDC 23155037831

## 2013-01-11 MED ADMIN — heparin (porcine) pf 100 Units: @ 22:00:00

## 2013-01-11 MED ADMIN — HYDROmorphone (PF) (DILAUDID) injection 1 mg: INTRAVENOUS | @ 20:00:00 | NDC 00409255201

## 2013-01-11 NOTE — Med Student Progress Note (Signed)
I was personally available for consultation in the emergency department.  I have reviewed the chart and agree with the documentation recorded by the Medical Student, including the assessment, treatment plan, and disposition.

## 2013-01-11 NOTE — ED Notes (Signed)
Patient placed in room 1 from hallway H, report and patient care received from Adrianne Looper at this time.

## 2013-01-11 NOTE — Med Student Progress Note (Signed)
Krista Lopez is a 47 yr old female who presents to the ED today complaining of right sided back pain in her thoracic spine with some radiation to her chest. She states that the pain started around 6 am, but became noticably worse around 11am. The pain is constant and sharp/stabbing in nature and she rates it as 10/10. She admits nausea, SOB w/ exertion, and fever 2 days prior, but denies vomiting, diarrhea, or constipation. She states that she has had 16 pulmonary embolisms in the past and is worried that this is a PE. 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 Also, she admits a history of anxiety, depression, and migraines. She has been seen in the ED numerous times over the past several months for various complaints.     VITALS: Temp:98.5 ??F (36.9 ??C), HR:94, BP:121/78 mmHg -- At rest, RR:18, O2 Sat 93 % Room air    GEN: 47 yr old caucasian female in moderate distress  HEENT: normocephalic, atraumatic, pt tearful  HEART: RRR, no murmurs or rubs   LUNGS: CTAB, no wheezes or crackles   ABD: soft, non tender, non distended, negative Murphy's sign  LE: soft, non tender, no edema    A/P  47 yr old female presenting with acute onset of right sided thoracic pain  LABS: CBC, CMP, D Dimer,   Imaging: Chest PA and Lat  Meds: Zofran, Dilaudid  Evaluate for possible PE  *ATTENTION:  This note has been created by a medical student for educational purposes only.  Please do not refer to the content of this note for clinical decision-making, billing, or other purposes.  Please see attending physician???s note to obtain clinical information on this patient.*

## 2013-01-11 NOTE — ED Provider Notes (Signed)
HPI Comments: Pt presents with pain in the right scapular area this morning that has been getting progressively worse and radiating to the upper right chest. It is pleuritic in nature. She feels like it is similar to prior episodes of PE, although she states she is compliant with her coumadin.    Patient is a 47 y.o. female presenting with chest pain. [name]   Chest Pain (Angina)   This is a new problem. The problem has been gradually worsening. Duration of episode(s) is 5 hours. The problem occurs constantly. The pain is associated with rest. The pain is present in the right side. The pain is severe. The quality of the pain is described as sharp. The symptoms are aggravated by movement and deep breathing. Associated symptoms include back pain and cough. Pertinent negatives include no abdominal pain, no claudication, no diaphoresis, no dizziness, no exertional chest pressure, no fever, no headaches, no hemoptysis, no irregular heartbeat, no leg pain, no lower extremity edema, no malaise/fatigue, no nausea, no near-syncope, no numbness, no orthopnea, no palpitations, no PND, no shortness of breath, no sputum production, no vomiting and no weakness. She has tried nothing for the symptoms. The treatment provided no relief. Risk factors: PE.        Past Medical History   Diagnosis Date   ??? Factor V Leiden mutation    ??? Infectious disease 02/2009      Hx MRSA / E.Coli bacteremia, MRSA wound infection 2/2 port   ??? Factor II deficiency    ??? Calculus of kidney    ??? Depression    ??? Iron deficiency    ??? Migraines    ??? Pulmonary embolism Multiple episodes     x 16 events - recent hospitalizations 3/14        Past Surgical History   Procedure Laterality Date   ??? Cystoscopy  ???     x 13   ??? Lithotripsy  2006   ??? Hx vascular access  July, 2010     Power Port placed in [place], [place]., removed   ??? Hx vascular access Right 2013     pt has current rt subclavian port - [facility]    ??? Hx appendectomy  ???   ??? Hx tonsillectomy     ??? Hx cesarean section     ??? Hx tubal ligation           Family History   Problem Relation Age of Onset   ??? Colon Cancer Father 44     Passed away due to ca   ??? Bleeding Prob Father      Factor V   ??? Cancer Maternal Grandmother      metastatic breast CA.   ??? Ovarian Cancer Mother 70     passed away due to ca   ??? Bipolar Disorder Mother    ??? Kidney Disease Brother      kidney stones        History     Social History   ??? Marital Status: DIVORCED     Spouse Name: N/A     Number of Children: N/A   ??? Years of Education: N/A     Occupational History   ??? Not on file.     Social History Main Topics   ??? Smoking status: Never Smoker    ??? Smokeless tobacco: Never Used   ??? Alcohol Use: Yes      Comment: rarely   ???  Drug Use: No      Comment: narcotic seeking per hospital hx.   ??? Sexually Active: Yes -- Female partner(s)     Birth Control/ Protection: Surgical     Other Topics Concern   ??? Not on file     Social History Narrative   ??? No narrative on file                  ALLERGIES: Ativan; Codeine; Iodinated contrast media - iv dye; Ketorolac tromethamine; Motrin; Pcn; Pneumococcal vaccine; Pneumovax 23; and Shellfish containing products      Review of Systems   Constitutional: Negative for fever, malaise/fatigue and diaphoresis.   Respiratory: Positive for cough. Negative for hemoptysis, sputum production and shortness of breath.    Cardiovascular: Positive for chest pain. Negative for palpitations, orthopnea, claudication, PND and near-syncope.   Gastrointestinal: Negative for nausea, vomiting and abdominal pain.   Musculoskeletal: Positive for back pain.   Neurological: Negative for dizziness, weakness, numbness and headaches.   All other systems reviewed and are negative.        Filed Vitals:    01/11/13 1308 01/11/13 1419   BP: 121/78 118/76   Pulse: 94 87   Temp: 98.5 ??F (36.9 ??C)    Resp: 18 18   Height: 5\' 4"  (1.626 m)    Weight: 64.411 kg (142 lb)    SpO2: 93% 98%             Physical Exam   Nursing note and vitals reviewed.  Constitutional: She is oriented to person, place, and time. She appears well-developed and well-nourished. No distress.   HENT:   Head: Normocephalic and atraumatic.   Mouth/Throat: No oropharyngeal exudate.   Eyes: Conjunctivae and EOM are normal. Pupils are equal, round, and reactive to light.   Neck: Normal range of motion. Neck supple.   Cardiovascular: Normal rate and regular rhythm.    Pulmonary/Chest: Effort normal and breath sounds normal.   Abdominal: Soft. Bowel sounds are normal.   Musculoskeletal: Normal range of motion. She exhibits no edema and no tenderness.   Neurological: She is alert and oriented to person, place, and time.   Skin: Skin is warm and dry.   Psychiatric: She has a normal mood and affect. Her behavior is normal.        MDM     Amount and/or Complexity of Data Reviewed:   Clinical lab tests:  Ordered and reviewed  Tests in the radiology section of CPT??:  Ordered and reviewed   Review and summarize past medical records:  Yes   Independant visualization of image, tracing, or specimen:  Yes  Risk of Significant Complications, Morbidity, and/or Mortality:   Presenting problems:  Moderate  Diagnostic procedures:  Moderate  Management options:  Moderate  General Comments: EKG: NSR, 86, NAP  Progress:   Patient progress:  Stable      Procedures

## 2013-01-11 NOTE — ED Notes (Signed)
Pt states right sided chest pain going through to her back with nausea that started at 0600 this morning, states pain woke her up.

## 2013-01-11 NOTE — ED Notes (Signed)
Reports right sided chest pain radiating into back.

## 2013-01-11 NOTE — ED Notes (Signed)
I have reviewed discharge instructions with the patient.  The patient verbalized understanding. Opportunity given for any questions. Patient advised that if given any narcotic medication that the patient is not allowed to drive while under influence of narcotic medication. Patient is having no signs of distress upon discharging patient.  Any prescriptions that were written have been given to patient and discussed. Signature obtained on discharge paper work for discharge.  Patient calling friends to come take her home.

## 2013-01-19 LAB — METABOLIC PANEL, COMPREHENSIVE
A-G Ratio: 1.3 (ref 1.2–3.5)
ALT (SGPT): 34 U/L (ref 12–65)
AST (SGOT): 17 U/L (ref 15–37)
Albumin: 4 g/dL (ref 3.5–5.0)
Alk. phosphatase: 169 U/L — ABNORMAL HIGH (ref 50–136)
Anion gap: 9 mmol/L (ref 7–16)
BUN: 16 MG/DL (ref 6–23)
Bilirubin, total: 0.1 MG/DL — ABNORMAL LOW (ref 0.2–1.1)
CO2: 28 mmol/L (ref 21–32)
Calcium: 9.4 MG/DL (ref 8.3–10.4)
Chloride: 102 mmol/L (ref 98–107)
Creatinine: 1 MG/DL (ref 0.6–1.0)
GFR est AA: >60 mL/min/{1.73_m2} (ref >60)
GFR est non-AA: >60 mL/min/{1.73_m2} (ref >60)
Globulin: 3.2 g/dL (ref 2.3–3.5)
Glucose: 86 mg/dL (ref 65–100)
Potassium: 4.2 mmol/L (ref 3.5–5.1)
Protein, total: 7.2 g/dL (ref 6.3–8.2)
Sodium: 139 mmol/L (ref 136–145)

## 2013-01-19 LAB — URINE MICROSCOPIC
Casts: 0 /lpf
Crystals, urine: 0 /LPF
RBC: >100 /hpf

## 2013-01-19 LAB — CBC WITH AUTOMATED DIFF
ABS. BASOPHILS: 0 10*3/uL (ref 0.0–0.2)
ABS. EOSINOPHILS: 0.3 10*3/uL (ref 0.0–0.8)
ABS. IMM. GRANS.: 0 10*3/uL (ref 0.0–0.5)
ABS. LYMPHOCYTES: 1.9 10*3/uL (ref 0.5–4.6)
ABS. MONOCYTES: 0.4 10*3/uL (ref 0.1–1.3)
ABS. NEUTROPHILS: 1.2 10*3/uL — ABNORMAL LOW (ref 1.7–8.2)
BASOPHILS: 1 % (ref 0.0–2.0)
EOSINOPHILS: 8 % — ABNORMAL HIGH (ref 0.5–7.8)
HCT: 36.5 % (ref 35.8–46.3)
HGB: 11.9 g/dL (ref 11.7–15.4)
IMMATURE GRANULOCYTES: 0 % (ref 0.0–5.0)
LYMPHOCYTES: 48 % — ABNORMAL HIGH (ref 13–44)
MCH: 30.4 PG (ref 26.1–32.9)
MCHC: 32.6 g/dL (ref 31.4–35.0)
MCV: 93.1 FL (ref 79.6–97.8)
MONOCYTES: 11 % (ref 4.0–12.0)
MPV: 10.7 FL — ABNORMAL LOW (ref 10.8–14.1)
NEUTROPHILS: 32 % — ABNORMAL LOW (ref 43–78)
PLATELET: 243 10*3/uL (ref 150–450)
RBC: 3.92 M/uL — ABNORMAL LOW (ref 4.05–5.25)
RDW: 13.7 % (ref 11.9–14.6)
WBC: 3.8 10*3/uL — ABNORMAL LOW (ref 4.3–11.1)

## 2013-01-19 LAB — POC TROPONIN: Troponin-I (POC): 0 ng/ml (ref 0.0–0.08)

## 2013-01-19 MED ADMIN — ondansetron (ZOFRAN) injection 4 mg: INTRAVENOUS | @ 20:00:00 | NDC 23155037831

## 2013-01-19 MED ADMIN — HYDROmorphone (PF) (DILAUDID) injection 0.5 mg: INTRAVENOUS | @ 22:00:00 | NDC 00409255201

## 2013-01-19 NOTE — ED Notes (Signed)
Per Ethelene Browns in lab inr > 6.8 PT 68 - results given to Dr Alfredo Bach

## 2013-01-19 NOTE — ED Provider Notes (Addendum)
HPI Comments: Here with right chest discomfort "under right ribs". Sore to move or touch. No bruise /redness or rash. No other complaint. No abdominal pain/ nausea or vomiting. No fever/chills or sputum. Seen here on 12/2 and at that time studies other than PT/INR were essentially normal. Also with blood to urine and last time has such has severely elevated INR requiring intervention    Patient is a 47 y.o. female presenting with chest pain. [name]   Chest Pain (Angina)   This is a new problem. The current episode started more than 1 week ago. The problem has been gradually worsening. The pain is associated with normal activity and breathing. The quality of the pain is described as sharp. The pain does not radiate. The symptoms are aggravated by movement. Pertinent negatives include no cough, no diaphoresis, no fever, no hemoptysis, no nausea, no shortness of breath, no sputum production and no vomiting. Her past medical history is significant for PE.       Past Medical History   Diagnosis Date   ??? Factor V Leiden mutation    ??? Infectious disease 02/2009      Hx MRSA / E.Coli bacteremia, MRSA wound infection 2/2 port   ??? Factor II deficiency    ??? Calculus of kidney    ??? Depression    ??? Iron deficiency    ??? Migraines    ??? Pulmonary embolism Multiple episodes     x 16 events - recent hospitalizations 3/14        Past Surgical History   Procedure Laterality Date   ??? Cystoscopy  ???     x 13   ??? Lithotripsy  2006   ??? Hx vascular access  July, 2010     Power Port placed in [place], [place] removed   ??? Hx vascular access Right 2013     pt has current rt subclavian port - [facility] at [facility]   ??? Hx appendectomy  ???   ??? Hx tonsillectomy     ??? Hx cesarean section     ??? Hx tubal ligation           Family History   Problem Relation Age of Onset   ??? Colon Cancer Father 20     Passed away due to ca   ??? Bleeding Prob Father      Factor V   ??? Cancer Maternal Grandmother      metastatic  breast CA.   ??? Ovarian Cancer Mother 15     passed away due to ca   ??? Bipolar Disorder Mother    ??? Kidney Disease Brother      kidney stones        History     Social History   ??? Marital Status: DIVORCED     Spouse Name: N/A     Number of Children: N/A   ??? Years of Education: N/A     Occupational History   ??? Not on file.     Social History Main Topics   ??? Smoking status: Never Smoker    ??? Smokeless tobacco: Never Used   ??? Alcohol Use: Yes      Comment: rarely   ??? Drug Use: No      Comment: narcotic seeking per hospital hx.   ??? Sexually Active: Yes -- Female partner(s)     Birth Control/ Protection: Surgical     Other Topics Concern   ??? Not on  file     Social History Narrative   ??? No narrative on file                  ALLERGIES: Ativan; Codeine; Iodinated contrast media - iv dye; Ketorolac tromethamine; Motrin; Pcn; Pneumococcal vaccine; Pneumovax 23; and Shellfish containing products      Review of Systems   Constitutional: Negative.  Negative for fever, chills and diaphoresis.   HENT: Negative.  Negative for neck pain.    Respiratory: Negative.  Negative for cough, hemoptysis, sputum production, shortness of breath, wheezing and stridor.    Cardiovascular: Positive for chest pain. Negative for leg swelling.   Gastrointestinal: Negative.  Negative for nausea and vomiting.   Endocrine: Negative.    Genitourinary: Negative.    Skin: Negative for color change and pallor.   Neurological: Negative.    Psychiatric/Behavioral: Negative.  Negative for behavioral problems.   All other systems reviewed and are negative.        Filed Vitals:    01/19/13 1253 01/19/13 1724 01/19/13 1725   BP: 104/56 115/56    Pulse: 94  84   Temp: 98.2 ??F (36.8 ??C)     Resp: 18     Height: 5' 4.5" (1.638 m)     Weight: 64.411 kg (142 lb)     SpO2: 97%  94%            Physical Exam   Nursing note and vitals reviewed.  Constitutional: She appears well-developed and well-nourished. No distress.   HENT:   Head: Atraumatic.   Mouth/Throat: Oropharynx  is clear and moist.   Eyes: Right conjunctiva has no hemorrhage. Left conjunctiva has no hemorrhage. No scleral icterus.   Neck: Neck supple.   Cardiovascular: Normal rate, regular rhythm and normal heart sounds.    Pulmonary/Chest: Effort normal. No respiratory distress. She has no wheezes. She exhibits tenderness.   Right lateral rib   Abdominal: There is no tenderness. There is no rebound.   Musculoskeletal: She exhibits no edema and no tenderness.   Neurological: She is alert. She exhibits normal muscle tone. Coordination normal.   Skin: Skin is warm and dry. No bruising and no ecchymosis noted.   Psychiatric: Thought content normal.        MDM    Procedures    Recent Results (from the past 12 hour(s))   URINE MICROSCOPIC    Collection Time     01/19/13  2:05 PM       Result Value Range    WBC 10-20  0 /hpf    RBC >100  0 /hpf    Epithelial cells 10-20  0 /hpf    Bacteria TRACE  0 /hpf    Casts 0  0 /lpf    Crystals 0  0 /LPF    Mucus TRACE  0 /lpf    Yeast OCCASIONAL      Other observations RESULTS VERIFIED MANUALLY     METABOLIC PANEL, COMPREHENSIVE    Collection Time     01/19/13  2:35 PM       Result Value Range    Sodium 139  136 - 145 mmol/L    Potassium 4.2  3.5 - 5.1 mmol/L    Chloride 102  98 - 107 mmol/L    CO2 28  21 - 32 mmol/L    Anion gap 9  7 - 16 mmol/L    Glucose 86  65 - 100 mg/dL    BUN 16  6 - 23 MG/DL    Creatinine 1.61  0.6 - 1.0 MG/DL    GFR est AA >09  >60 ml/min/1.36m2    GFR est non-AA >60  >60 ml/min/1.33m2    Calcium 9.4  8.3 - 10.4 MG/DL    Bilirubin, total 0.1 (*) 0.2 - 1.1 MG/DL    ALT 34  12 - 65 U/L    AST 17  15 - 37 U/L    Alk. phosphatase 169 (*) 50 - 136 U/L    Protein, total 7.2  6.3 - 8.2 g/dL    Albumin 4.0  3.5 - 5.0 g/dL    Globulin 3.2  2.3 - 3.5 g/dL    A-G Ratio 1.3  1.2 - 3.5     CBC WITH AUTOMATED DIFF    Collection Time     01/19/13  2:35 PM       Result Value Range    WBC 3.8 (*) 4.3 - 11.1 K/uL    RBC 3.92 (*) 4.05 - 5.25 M/uL    HGB 11.9  11.7 - 15.4 g/dL    HCT  45.4  09.8 - 11.9 %    MCV 93.1  79.6 - 97.8 FL    MCH 30.4  26.1 - 32.9 PG    MCHC 32.6  31.4 - 35.0 g/dL    RDW 14.7  82.9 - 56.2 %    PLATELET 243  150 - 450 K/uL    MPV 10.7 (*) 10.8 - 14.1 FL    DF AUTOMATED      NEUTROPHILS 32 (*) 43 - 78 %    LYMPHOCYTES 48 (*) 13 - 44 %    MONOCYTES 11  4.0 - 12.0 %    EOSINOPHILS 8 (*) 0.5 - 7.8 %    BASOPHILS 1  0.0 - 2.0 %    IMMATURE GRANULOCYTES 0.0  0.0 - 5.0 %    ABS. NEUTROPHILS 1.2 (*) 1.7 - 8.2 K/UL    ABS. LYMPHOCYTES 1.9  0.5 - 4.6 K/UL    ABS. MONOCYTES 0.4  0.1 - 1.3 K/UL    ABS. EOSINOPHILS 0.3  0.0 - 0.8 K/UL    ABS. BASOPHILS 0.0  0.0 - 0.2 K/UL    ABS. IMM. GRANS. 0.0  0.0 - 0.5 K/UL   POC TROPONIN-I    Collection Time     01/19/13  2:38 PM       Result Value Range    Troponin-I (POC) 0  0.0 - 0.08 ng/ml      Hospitalist paged.  Went to [employer], number left for callback

## 2013-01-19 NOTE — H&P (Signed)
History and Physical    Subjective:     Krista Lopez is a 47 y.o. Caucasian female who presents with ABD PAIN and has been elevated for SUPRATHERAPEUTIC INR.  She has long h/o PE & Factor V Leiden. She is on chronic coumadin and is a frequent denizen of ER for various complaints, today being abd pain, right side. She was found to have an elevated INR, apparently her blood had to be sent to [facility] due as it was beyond [facility] lab parameters. However [facility] result was also "> 6.8."  She had retroperitoneal [place] to make sure there was no bleed. Due to extensive length of stay in ER, Hospitalist has been asked to admit. She also complains of some hematuria, though this is not a new problem, given h/o kidney stones.    She did have some abx in [place] for a tooth abscess, and this may have interacted with her coumadin    HEME MD is Clarnce Flock      Past Medical History   Diagnosis Date   ??? Factor V Leiden mutation    ??? Infectious disease 02/2009      Hx MRSA / E.Coli bacteremia, MRSA wound infection 2/2 port   ??? Factor II deficiency    ??? Calculus of kidney    ??? Depression    ??? Iron deficiency    ??? Migraines    ??? Pulmonary embolism Multiple episodes     x 16 events - recent hospitalizations 3/14      Past Surgical History   Procedure Laterality Date   ??? Cystoscopy  ???     x 13   ??? Lithotripsy  2006   ??? Hx vascular access  July, 2010     Power Port placed in [place], [place] removed   ??? Hx vascular access Right 2013     pt has current rt subclavian port - placed at [facility]   ??? Hx appendectomy  ???   ??? Hx tonsillectomy     ??? Hx cesarean section     ??? Hx tubal ligation       Family History   Problem Relation Age of Onset   ??? Colon Cancer Father 74     Passed away due to ca   ??? Bleeding Prob Father      Factor V   ??? Cancer Maternal Grandmother      metastatic breast CA.   ??? Ovarian Cancer Mother 27     passed away due to ca   ??? Bipolar Disorder Mother    ??? Kidney Disease Brother      kidney stones      History   Substance Use  Topics   ??? Smoking status: Never Smoker    ??? Smokeless tobacco: Never Used   ??? Alcohol Use: Yes      Comment: rarely       Prior to Admission medications    Medication Sig Start Date End Date Taking? Authorizing Provider   SUMAtriptan (IMITREX) 100 mg tablet Take 100 mg by mouth once as needed for Migraine.   Yes Historical Provider   promethazine (PHENERGAN) 25 mg tablet Take 25 mg by mouth every six (6) hours as needed. 10/17/12  Yes Phys Other, MD   warfarin (COUMADIN) 5 mg tablet Take 5 mg by mouth daily. Indications: PULMONARY THROMBOEMBOLISM   Yes Shirline Frees, MD   duloxetine (CYMBALTA) 30 mg capsule Take 3 capsules by mouth daily. Indications: Bipolar Disorder 12/06/12  Yes Lopa S Bhansaly, DO   quetiapine (SEROQUEL) 300 mg tablet Take 1 tablet by mouth nightly. 12/06/12  Yes Lopa S Bhansaly, DO   KLONOPIN 1 mg Tab Take 1 mg by mouth three (3) times daily. Indications: Anxiety disorder   Yes Phys Other, MD     Allergies   Allergen Reactions   ??? Ativan [Lorazepam] Itching   ??? Codeine Rash   ??? Iodinated Contrast Media - Iv Dye Rash     States also has swelling, and allergic to shellfish   ??? Ketorolac Tromethamine Rash   ??? Motrin [Ibuprofen] Other (comments)     Causes stomach upset after 3 doses.   ??? Pcn [Penicillins] Hives   ??? Pneumococcal Vaccine Swelling   ??? Pneumovax 23 [Pneumococcal 23-Valps Vaccine] Other (comments)     Local reaction   ??? Shellfish Containing Products Rash        Review of Systems:  A comprehensive review of systems was negative except for that written in the History of Present Illness.     Objective:     Intake and Output:            Physical Exam:   BP 124/65   Pulse 86   Temp(Src) 98.2 ??F (36.8 ??C)   Resp 20   Ht 5' 4.5" (1.638 m)   Wt 64.411 kg (142 lb)   BMI 24.01 kg/m2   SpO2 94%   LMP 07/28/2004  General appearance: alert, fatigued, cooperative, no distress, appears stated age, texting on phone as hospitalist entered room, complaining of pain  Neck: supple, symmetrical, trachea  midline, no carotid bruit and no JVD  Lungs: clear to auscultation bilaterally  Heart: regular rate and rhythm, S1, S2 normal, no murmur, click, rub or gallop  Abdomen: soft, non-tender. Bowel sounds normal. No masses,  no organomegaly  Extremities: extremities normal, atraumatic, no cyanosis or edema  Skin: Skin color, texture, turgor normal. No rashes or lesions  Neurologic: Grossly normal  PSYCH : appropriate affect      ECG:       Data Review:   Recent Results (from the past 24 hour(s))   URINE MICROSCOPIC    Collection Time     01/19/13  2:05 PM       Result Value Range    WBC 10-20  0 /hpf    RBC >100  0 /hpf    Epithelial cells 10-20  0 /hpf    Bacteria TRACE  0 /hpf    Casts 0  0 /lpf    Crystals 0  0 /LPF    Mucus TRACE  0 /lpf    Yeast OCCASIONAL      Other observations RESULTS VERIFIED MANUALLY     METABOLIC PANEL, COMPREHENSIVE    Collection Time     01/19/13  2:35 PM       Result Value Range    Sodium 139  136 - 145 mmol/L    Potassium 4.2  3.5 - 5.1 mmol/L    Chloride 102  98 - 107 mmol/L    CO2 28  21 - 32 mmol/L    Anion gap 9  7 - 16 mmol/L    Glucose 86  65 - 100 mg/dL    BUN 16  6 - 23 MG/DL    Creatinine 2.95  0.6 - 1.0 MG/DL    GFR est AA >62  >13 ml/min/1.57m2    GFR est non-AA >60  >60 ml/min/1.34m2    Calcium 9.4  8.3 - 10.4 MG/DL    Bilirubin, total 0.1 (*) 0.2 - 1.1 MG/DL    ALT 34  12 - 65 U/L    AST 17  15 - 37 U/L    Alk. phosphatase 169 (*) 50 - 136 U/L    Protein, total 7.2  6.3 - 8.2 g/dL    Albumin 4.0  3.5 - 5.0 g/dL    Globulin 3.2  2.3 - 3.5 g/dL    A-G Ratio 1.3  1.2 - 3.5     CBC WITH AUTOMATED DIFF    Collection Time     01/19/13  2:35 PM       Result Value Range    WBC 3.8 (*) 4.3 - 11.1 K/uL    RBC 3.92 (*) 4.05 - 5.25 M/uL    HGB 11.9  11.7 - 15.4 g/dL    HCT 16.1  09.6 - 04.5 %    MCV 93.1  79.6 - 97.8 FL    MCH 30.4  26.1 - 32.9 PG    MCHC 32.6  31.4 - 35.0 g/dL    RDW 40.9  81.1 - 91.4 %    PLATELET 243  150 - 450 K/uL    MPV 10.7 (*) 10.8 - 14.1 FL    DF AUTOMATED       NEUTROPHILS 32 (*) 43 - 78 %    LYMPHOCYTES 48 (*) 13 - 44 %    MONOCYTES 11  4.0 - 12.0 %    EOSINOPHILS 8 (*) 0.5 - 7.8 %    BASOPHILS 1  0.0 - 2.0 %    IMMATURE GRANULOCYTES 0.0  0.0 - 5.0 %    ABS. NEUTROPHILS 1.2 (*) 1.7 - 8.2 K/UL    ABS. LYMPHOCYTES 1.9  0.5 - 4.6 K/UL    ABS. MONOCYTES 0.4  0.1 - 1.3 K/UL    ABS. EOSINOPHILS 0.3  0.0 - 0.8 K/UL    ABS. BASOPHILS 0.0  0.0 - 0.2 K/UL    ABS. IMM. GRANS. 0.0  0.0 - 0.5 K/UL   TYPE, ABO & RH    Collection Time     01/19/13  2:35 PM       Result Value Range    ABO/Rh(D) A POSITIVE     POC TROPONIN-I    Collection Time     01/19/13  2:38 PM       Result Value Range    Troponin-I (POC) 0  0.0 - 0.08 ng/ml   POC TROPONIN-I    Collection Time     01/19/13  6:48 PM       Result Value Range    Troponin-I (POC) 0  0.0 - 0.08 ng/ml       Chest x-ray .    Assessment:     Principal Problem:    Supratherapeutic INR (01/19/2013)      Overview: INR>6.8. Apparently unable to quantify per ER. 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

## 2013-01-19 NOTE — ED Notes (Signed)
Bedside report to Dennis, RN.

## 2013-01-19 NOTE — ED Notes (Signed)
Rad tech at bedside for portable chest x-ray.

## 2013-01-19 NOTE — Progress Notes (Signed)
TRANSFER - IN REPORT:    Verbal report received from Ferguson, RN on [facility]  being received from ED for routine progression of care      Report consisted of patient???s Situation, Background, Assessment and   Recommendations(SBAR).     Information from the following report(s) SBAR, Kardex, ED Summary, Intake/Output, MAR and Med Rec Status was reviewed with the receiving nurse.    Opportunity for questions and clarification was provided.      Assessment will be completed upon patient???s arrival to unit and care will be assumed.

## 2013-01-19 NOTE — Progress Notes (Signed)
Assessment completed via doc flowsheet, pt alert and oriented, respirations present, even and unlabored, S1&S2 auscultated, HR regular, abd soft, non-tender, bowel sounds present in +4 quadrants, pt denies any pain at this time, pt oriented to room and call light, pt denies any needs, pt instructed to call for assistance, pt verbalizes understanding, bed low and locked, side rails x2.     Dual skin assessment performed with Krista Islam, RN, pts skin WDL, scattered ecchymosis noted (patient on coumadin at home), no open cuts, sores, or wounds noted.

## 2013-01-19 NOTE — Progress Notes (Signed)
Pts MEWs score of a 3, Lavell Islam, clinical coordinator notified.

## 2013-01-19 NOTE — ED Notes (Signed)
sts hx PE, c/o rt side chest/rib area pain onset this am, denies sob/n/v

## 2013-01-19 NOTE — Progress Notes (Signed)
Patient with MEWS score of 3. HR 111. No distress noted. Primary RN to monitor.

## 2013-01-19 NOTE — ED Notes (Signed)
sts sob on exertion

## 2013-01-19 NOTE — ED Notes (Signed)
Patient resting in bed, lying on right side.  Patient given warm blankets per request.

## 2013-01-19 NOTE — ED Notes (Signed)
Resting quietly and skin warm and dry, port access patent, requesting medication for nausea

## 2013-01-19 NOTE — ED Notes (Signed)
DID give morphine or benadryl as admitting physician gave verbal order no discontinue them. Morphine was wasted and benadryl was wasted as well as both were drawn up but not given, by verbal of DR [facility] admitting physcian

## 2013-01-19 NOTE — Other (Signed)
TRANSFER - OUT REPORT:    Verbal report given to CASEY RN(name) on Krista Lopez  being transferred to 352(unit) for routine progression of care       Report consisted of patient???s Situation, Background, Assessment and   Recommendations(SBAR).     Information from the following report(s) SBAR, ED Summary, [facility] and Recent Results was reviewed with the receiving nurse.    Opportunity for questions and clarification was provided.

## 2013-01-20 LAB — BLOOD TYPE, (ABO+RH)
ABO/Rh(D): A POS
ABO/Rh: A POS

## 2013-01-20 LAB — PROTHROMBIN TIME + INR
INR: >6.8 — CR (ref 0.9–1.2)
INR: 2.3 — ABNORMAL HIGH (ref 0.9–1.2)
Prothrombin time: >63 s — ABNORMAL HIGH (ref 8.6–12.2)
Prothrombin time: 25.2 s — ABNORMAL HIGH (ref 9.6–12.0)

## 2013-01-20 LAB — EKG, 12 LEAD, INITIAL
Atrial Rate: 85 {beats}/min
Calculated P Axis: 78 degrees
Calculated R Axis: 89 degrees
Calculated T Axis: 70 degrees
Diagnosis: NORMAL
P-R Interval: 156 ms
Q-T Interval: 358 ms
QRS Duration: 88 ms
QTC Calculation (Bezet): 426 ms
Ventricular Rate: 85 {beats}/min

## 2013-01-20 LAB — MRSA SCREEN - PCR (NASAL)

## 2013-01-20 LAB — POC TROPONIN: Troponin-I (POC): 0 ng/ml (ref 0.0–0.08)

## 2013-01-20 MED ADMIN — QUEtiapine (SEROquel) tablet 300 mg: ORAL | @ 05:00:00 | NDC 68084053211

## 2013-01-20 MED ADMIN — ondansetron (ZOFRAN) injection 4 mg: INTRAVENOUS | @ 20:00:00 | NDC 23155037831

## 2013-01-20 MED ADMIN — sodium chloride (NS) flush 5-10 mL: INTRAVENOUS | @ 04:00:00 | NDC 17474300201

## 2013-01-20 MED ADMIN — ondansetron (ZOFRAN) injection 4 mg: INTRAVENOUS | @ 02:00:00 | NDC 23155037831

## 2013-01-20 MED ADMIN — clonazePAM (KlonoPIN) tablet 1 mg: ORAL | @ 20:00:00 | NDC 63739026310

## 2013-01-20 MED ADMIN — HYDROcodone-acetaminophen (NORCO) 5-325 mg per tablet 1 tablet: ORAL | @ 03:00:00 | NDC 51079077701

## 2013-01-20 MED ADMIN — ondansetron (ZOFRAN) injection 4 mg: INTRAVENOUS | @ 13:00:00 | NDC 23155037831

## 2013-01-20 MED ADMIN — sodium chloride (NS) flush 5-10 mL: INTRAVENOUS | @ 11:00:00 | NDC 87701099893

## 2013-01-20 MED ADMIN — clonazePAM (KlonoPIN) tablet 1 mg: ORAL | @ 14:00:00 | NDC 63739026310

## 2013-01-20 MED ADMIN — acetaminophen (TYLENOL) tablet 650 mg: ORAL | @ 13:00:00 | NDC 51645070310

## 2013-01-20 MED ADMIN — DULoxetine (CYMBALTA) capsule 90 mg: ORAL | @ 14:00:00 | NDC 66993007630

## 2013-01-20 MED ADMIN — clonazePAM (KlonoPIN) tablet 1 mg: ORAL | @ 05:00:00 | NDC 63739026310

## 2013-01-20 NOTE — Progress Notes (Signed)
Talked with pt again about discharge. Pt stated she would not have a ride to pick her up from hospital until sometime during the late afternoon due to family working.

## 2013-01-20 NOTE — Discharge Summary (Signed)
Physician Discharge Summary     Patient ID:  Krista Lopez  [id number]  47 y.o.  1965-04-09    Admit date: 01/19/2013    Discharge date and time: 01/20/2013    Admission Diagnoses: Supratherapeutic INR    Discharge Diagnoses:  Principal Diagnosis Supratherapeutic INR                                            Principal Problem:    Supratherapeutic INR (01/19/2013)      Overview: INR>6.8. Apparently unable to quantify per ER. 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 Caucasian female who presents with ABD PAIN and has been elevated for SUPRATHERAPEUTIC INR. She has long h/o PE & Factor V Leiden. She is on chronic coumadin and is a frequent denizen of ER for various complaints, today being abd pain, right side. She was found to have an elevated INR, apparently her blood had to be sent to [facility] due as it was beyond [facility] lab parameters. However [facility] result was also "> 6.8." She had retroperitoneal [place] to make sure there was no bleed. Due to extensive length of stay in ER, Hospitalist has been asked to admit. She also complains of some hematuria, though this is not a new problem, given h/o kidney stones.   She did have some abx in [place] for a tooth abscess, and this may have interacted with her coumadin     She was admitted to the floor, where she was given FFP. Her listed home meds were re-ordered and she spent most of the night completely drowsy from that cocktail of medications. Her only narcotics were given in ER prior to my arrival. Her discharge INR after the FFP is 2.3. She was advised about the possibility of interaction between her coumadin and abx.      PCP: HEME MD is [employer]    Consults: None    Significant Diagnostic Studies: see  chart:  1) Renal ultrasound   CLINICAL INDICATION: Right flank pain   Technique: Realtime grayscale and color Doppler evaluation of the kidneys and   bladder.   COMPARISON: 11/05/2012 abdominal ultrasound, 10/02/2012 radiographs   FINDINGS: Right kidney is normal in size and echogenicity measuring 10.4 cm.   Left kidney is normal in size and echogenicity measuring 10.4 cm. In the right b   there is a 6 mm echogenic focus with shadowing.   There is no hydronephrosis. Color Doppler demonstrates flow to both kidneys.   The urinary bladder is partially distended with anechoic fluid. Bilateral   ureteral jets are seen.   The partially visualized aorta is normal in caliber. The IVC is patent on   doppler, with normal spectral doppler waveforms.   IMPRESSION:   1. No hydronephrosis.   2. Small right renal calculus again noted.      Discharge Exam:  BP 99/59   Pulse 79   Temp(Src) 97.5 ??F (36.4 ??C)  Resp 18   Ht 5' 4.5" (1.638 m)   Wt 64.411 kg (142 lb)   BMI 24.01 kg/m2   SpO2 99%   LMP 07/28/2004   Breastfeeding? No  General appearance: cooperative, no distress, slowed mentation, appears older than stated age, drowsy  Lungs: clear to auscultation bilaterally  Heart: regular rate and rhythm, S1, S2 normal, no murmur, click, rub or gallop  Abdomen: no abd pain elicited despite deep palpation with stethoscope  Extremities: extremities normal, atraumatic, no cyanosis or edema    Disposition: home    Patient Instructions:   Current Discharge Medication List      CONTINUE these medications which have NOT CHANGED    Details   SUMAtriptan (IMITREX) 100 mg tablet Take 100 mg by mouth once as needed for Migraine.      promethazine (PHENERGAN) 25 mg tablet Take 25 mg by mouth every six (6) hours as needed.      warfarin (COUMADIN) 5 mg tablet Take 5 mg by mouth daily. Indications: PULMONARY THROMBOEMBOLISM      duloxetine (CYMBALTA) 30 mg capsule Take 3 capsules by mouth daily. Indications: Bipolar Disorder  Qty: 90 capsule, Refills:  1    Associated Diagnoses: Bipolar 2 disorder      quetiapine (SEROQUEL) 300 mg tablet Take 1 tablet by mouth nightly.  Qty: 30 tablet, Refills: 1    Associated Diagnoses: Bipolar 2 disorder      KLONOPIN 1 mg Tab Take 1 mg by mouth three (3) times daily. Indications: Anxiety disorder           Activity: Activity as tolerated  Diet: Regular Diet  Wound Care: None needed    Follow-up with PCP in 1 week.  Follow-up tests/labs PROTIME  Amount of time spent on discharge: 25"    Signed:  Lynne Logan, MD  01/20/2013  6:23 AM

## 2013-01-20 NOTE — Progress Notes (Signed)
Pt assessment completed. Pt state she has a pain of 6/10. Cannot tell me where pain is. Pt states she also has nausea. Pt given acetaminophen 650 mg PO and zofran 4 mg SIVP. When pt was told about discharge pt stated she had bloody urine and wanted the MD to know. Pt flushed urine so I was unable to visualize. Pt was provided with a hat in the toilet to collect urine. Pt was instructed to call once she used the restroom.

## 2013-01-20 NOTE — Progress Notes (Signed)
Pt voided into hat. Urine was yellow clear and had no odor. Informed pt that her urine was normal to help address her earlier concern.

## 2013-01-21 LAB — PLASMA, ALLOCATE
Status of unit: TRANSFUSED
Status of unit: TRANSFUSED
Unit division: 0
Unit division: 0

## 2013-01-31 NOTE — Telephone Encounter (Signed)
[facility] called to say that the refill for Cymbalta 30 mg, 3x daily is not covered.  2x daily is covered.  Would you me to called [facility] back and confirm the 2x daily?

## 2013-01-31 NOTE — Telephone Encounter (Signed)
Per patient she has been taking Cymbalta 30mg  - 3 caps daily. If her pharmacy will cover twice a day that is fine, however pt will have to pay for the additional capsule/day. Reviewed medication profile and she can safely take 90mg /day if that is what she had been on. Pt needs to see psych to deteremine appropriate dose.     Thanks  Cheveyo Vassar S Felice Hope, DO

## 2013-02-08 LAB — EKG, 12 LEAD, INITIAL
Atrial Rate: 107 {beats}/min
Atrial Rate: 107 {beats}/min
Calculated P Axis: 77 degrees
Calculated P Axis: 84 degrees
Calculated R Axis: 94 degrees
Calculated R Axis: 95 degrees
Calculated T Axis: 78 degrees
Calculated T Axis: 85 degrees
P-R Interval: 150 ms
P-R Interval: 156 ms
Q-T Interval: 312 ms
Q-T Interval: 336 ms
QRS Duration: 76 ms
QRS Duration: 80 ms
QTC Calculation (Bezet): 416 ms
QTC Calculation (Bezet): 448 ms
Ventricular Rate: 107 {beats}/min
Ventricular Rate: 107 {beats}/min

## 2013-02-08 LAB — CBC WITH AUTOMATED DIFF
ABS. BASOPHILS: 0 10*3/uL (ref 0.0–0.2)
ABS. EOSINOPHILS: 0.2 10*3/uL (ref 0.0–0.8)
ABS. IMM. GRANS.: 0 10*3/uL (ref 0.0–0.5)
ABS. LYMPHOCYTES: 1.3 10*3/uL (ref 0.5–4.6)
ABS. MONOCYTES: 0.5 10*3/uL (ref 0.1–1.3)
ABS. NEUTROPHILS: 3.6 10*3/uL (ref 1.7–8.2)
BASOPHILS: 1 % (ref 0.0–2.0)
EOSINOPHILS: 4 % (ref 0.5–7.8)
HCT: 39.2 % (ref 35.8–46.3)
HGB: 13.3 g/dL (ref 11.7–15.4)
IMMATURE GRANULOCYTES: 0.2 % (ref 0.0–5.0)
LYMPHOCYTES: 23 % (ref 13–44)
MCH: 30.7 PG (ref 26.1–32.9)
MCHC: 33.9 g/dL (ref 31.4–35.0)
MCV: 90.5 FL (ref 79.6–97.8)
MONOCYTES: 8 % (ref 4.0–12.0)
MPV: 10.5 FL — ABNORMAL LOW (ref 10.8–14.1)
NEUTROPHILS: 64 % (ref 43–78)
PLATELET: 367 10*3/uL (ref 150–450)
RBC: 4.33 M/uL (ref 4.05–5.25)
RDW: 13.1 % (ref 11.9–14.6)
WBC: 5.6 10*3/uL (ref 4.3–11.1)

## 2013-02-08 LAB — METABOLIC PANEL, BASIC
Anion gap: 7 mmol/L (ref 7–16)
BUN: 17 MG/DL (ref 6–23)
CO2: 25 mmol/L (ref 21–32)
Calcium: 10.1 MG/DL (ref 8.3–10.4)
Chloride: 105 mmol/L (ref 98–107)
Creatinine: 0.72 MG/DL (ref 0.6–1.0)
GFR est AA: >60 mL/min/{1.73_m2} (ref >60)
GFR est non-AA: >60 mL/min/{1.73_m2} (ref >60)
Glucose: 101 mg/dL — ABNORMAL HIGH (ref 65–100)
Potassium: 4.2 mmol/L (ref 3.5–5.1)
Sodium: 137 mmol/L (ref 136–145)

## 2013-02-08 LAB — POC TROPONIN: Troponin-I (POC): 0 ng/ml (ref 0.0–0.08)

## 2013-02-08 LAB — PROTHROMBIN TIME + INR
INR: 1 (ref 0.9–1.2)
Prothrombin time: 10.9 s (ref 9.6–12.0)

## 2013-02-08 LAB — MAGNESIUM: Magnesium: 2.2 mg/dL (ref 1.8–2.4)

## 2013-02-08 LAB — C REACTIVE PROTEIN, QT: C-Reactive protein: <0.3 mg/dL (ref 0.0–0.9)

## 2013-02-08 MED ORDER — HYDROMORPHONE (PF) 1 MG/ML IJ SOLN
1 mg/mL | INTRAMUSCULAR | Status: AC
Start: 2013-02-08 — End: 2013-02-08
  Administered 2013-02-08: 17:00:00 via INTRAVENOUS

## 2013-02-08 MED ORDER — HYDROMORPHONE (PF) 1 MG/ML IJ SOLN
1 mg/mL | INTRAMUSCULAR | Status: AC
Start: 2013-02-08 — End: 2013-02-08
  Administered 2013-02-08: 15:00:00 via INTRAVENOUS

## 2013-02-08 MED ORDER — NUCLEAR MEDICINE ISOTOPE
Freq: Once | Status: AC
Start: 2013-02-08 — End: 2013-02-08
  Administered 2013-02-08: 16:00:00

## 2013-02-08 MED ORDER — WARFARIN 5 MG TAB
5 mg | ORAL | Status: AC
Start: 2013-02-08 — End: 2013-02-08
  Administered 2013-02-08: 17:00:00 via ORAL

## 2013-02-08 MED ORDER — SODIUM CHLORIDE 0.9 % IJ SYRG
Freq: Three times a day (TID) | INTRAMUSCULAR | Status: DC
Start: 2013-02-08 — End: 2013-02-08

## 2013-02-08 MED ORDER — ONDANSETRON (PF) 4 MG/2 ML INJECTION
4 mg/2 mL | INTRAMUSCULAR | Status: AC
Start: 2013-02-08 — End: 2013-02-08
  Administered 2013-02-08: 14:00:00 via INTRAVENOUS

## 2013-02-08 MED ORDER — SODIUM CHLORIDE 0.9 % IJ SYRG
INTRAMUSCULAR | Status: DC | PRN
Start: 2013-02-08 — End: 2013-02-08
  Administered 2013-02-08: 14:00:00 via INTRAVENOUS

## 2013-02-08 MED ORDER — DIPHENHYDRAMINE HCL 50 MG/ML IJ SOLN
50 mg/mL | INTRAMUSCULAR | Status: AC
Start: 2013-02-08 — End: 2013-02-08
  Administered 2013-02-08: 14:00:00 via INTRAVENOUS

## 2013-02-08 MED ORDER — MORPHINE 4 MG/ML SYRINGE
4 mg/mL | INTRAMUSCULAR | Status: AC
Start: 2013-02-08 — End: 2013-02-08
  Administered 2013-02-08: 14:00:00 via INTRAVENOUS

## 2013-02-08 MED ORDER — HEPARIN, PORCINE (PF) 100 UNIT/ML IV SYRINGE
100 unit/mL | INTRAVENOUS | Status: DC | PRN
Start: 2013-02-08 — End: 2013-02-08
  Administered 2013-02-08: 17:00:00

## 2013-02-08 NOTE — ED Notes (Signed)
Pt is back from xray. Pt states pain is down a little bit

## 2013-02-08 NOTE — ED Notes (Signed)
[facility] called to transport pt. [facility] number is [phone]

## 2013-02-08 NOTE — ED Notes (Signed)
Pt going to NM. Pt states pain is down to a 4

## 2013-02-08 NOTE — ED Notes (Signed)
Pt going to xray

## 2013-02-08 NOTE — ED Notes (Signed)
Stacy at bedside with pt. Pt requesting more pain medication. Dr. Grace Isaac notified

## 2013-02-08 NOTE — ED Notes (Signed)
Pt reports chest pain and dyspnea with exertion since 0400 today

## 2013-02-08 NOTE — ED Provider Notes (Addendum)
HPI Comments: Pt, with Hx of factor V leiden, and who was recently hospitalized for coumadin coagulopathy, presents complaining of sharp, pleuritic chest pain that began last night.  The pain radiates to the right scapular area. She has been using Lovenox at home, but has been spacing out her coumadin tablets because she states she can't get a refill until 6 Jan.     Patient is a 47 y.o. female presenting with chest pain. [name]   Chest Pain (Angina)   This is a new problem. The current episode started 12 to 24 hours ago. The problem has not changed since onset.The problem occurs constantly. The pain is associated with rest. The pain is present in the right side. The pain is at a severity of 8/10. The pain is severe. The quality of the pain is described as sharp and pleuritic. Radiates to: right scapula. Pertinent negatives include no abdominal pain, no back pain, no claudication, no cough, no diaphoresis, no dizziness, no exertional chest pressure, no fever, no headaches, no hemoptysis, no irregular heartbeat, no leg pain, no lower extremity edema, no malaise/fatigue, no nausea, no near-syncope, no numbness, no orthopnea, no palpitations, no PND, no shortness of breath, no sputum production, no vomiting and no weakness. She has tried nothing for the symptoms. The treatment provided no relief. Risk factors: hypercoag state. Her past medical history is significant for PE.       Past Medical History   Diagnosis Date   ??? Factor V Leiden mutation    ??? Infectious disease 02/2009      Hx MRSA / E.Coli bacteremia, MRSA wound infection 2/2 port   ??? Factor II deficiency    ??? Calculus of kidney    ??? Depression    ??? Iron deficiency    ??? Migraines    ??? Pulmonary embolism Multiple episodes     x 16 events - recent hospitalizations 3/14        Past Surgical History   Procedure Laterality Date   ??? Cystoscopy  ???     x 13   ??? Lithotripsy  2006   ??? Hx vascular access  July, 2010     Power Port placed  in [place], [place] removed   ??? Hx vascular access Right 2013     pt has current rt subclavian port - placed at [facility]   ??? Hx appendectomy  ???   ??? Hx tonsillectomy     ??? Hx cesarean section     ??? Hx tubal ligation           Family History   Problem Relation Age of Onset   ??? Colon Cancer Father 65     Passed away due to ca   ??? Bleeding Prob Father      Factor V   ??? Cancer Maternal Grandmother      metastatic breast CA.   ??? Ovarian Cancer Mother 23     passed away due to ca   ??? Bipolar Disorder Mother    ??? Kidney Disease Brother      kidney stones        History     Social History   ??? Marital Status: DIVORCED     Spouse Name: N/A     Number of Children: N/A   ??? Years of Education: N/A     Occupational History   ??? Not on file.     Social History Main Topics   ???  Smoking status: Never Smoker    ??? Smokeless tobacco: Never Used   ??? Alcohol Use: Yes      Comment: rarely   ??? Drug Use: No      Comment: narcotic seeking per hospital hx.   ??? Sexually Active: Yes -- Female partner(s)     Birth Control/ Protection: Surgical     Other Topics Concern   ??? Not on file     Social History Narrative   ??? No narrative on file                  ALLERGIES: Ativan; Codeine; Iodinated contrast media - iv dye; Ketorolac tromethamine; Motrin; Pcn; Pneumococcal vaccine; Pneumovax 23; and Shellfish containing products      Review of Systems   Constitutional: Negative for fever, malaise/fatigue and diaphoresis.   Respiratory: Negative for cough, hemoptysis, sputum production and shortness of breath.    Cardiovascular: Positive for chest pain. Negative for palpitations, orthopnea, claudication, PND and near-syncope.   Gastrointestinal: Negative for nausea, vomiting and abdominal pain.   Musculoskeletal: Negative for back pain.   Neurological: Negative for dizziness, weakness, numbness and headaches.   All other systems reviewed and are negative.        Filed Vitals:    02/08/13 0755   BP: 110/76   Pulse: 105   Temp: 98.3 ??F (36.8 ??C)    Resp: 22   Height: 5' 4.5" (1.638 m)   Weight: 61.689 kg (136 lb)   SpO2: 99%            Physical Exam   Nursing note and vitals reviewed.  Constitutional: She is oriented to person, place, and time. She appears well-developed and well-nourished. No distress.   HENT:   Head: Normocephalic and atraumatic.   Eyes: Conjunctivae and EOM are normal. Pupils are equal, round, and reactive to light.   Neck: Normal range of motion. Neck supple.   Cardiovascular: Regular rhythm and normal heart sounds.    Tachycardic     Pulmonary/Chest: Effort normal and breath sounds normal. She has no wheezes. She has no rales.   Abdominal: Soft. She exhibits no distension. There is no tenderness.   Musculoskeletal: Normal range of motion. She exhibits no edema and no tenderness.   Neurological: She is alert and oriented to person, place, and time.   Skin: Skin is warm and dry.   Psychiatric: She has a normal mood and affect. Her behavior is normal.        MDM     Amount and/or Complexity of Data Reviewed:   Clinical lab tests:  Ordered and reviewed  Tests in the radiology section of CPT??:  Ordered and reviewed   Review and summarize past medical records:  Yes   Discuss the patient with another provider:  Yes   Independant visualization of image, tracing, or specimen:  Yes  Risk of Significant Complications, Morbidity, and/or Mortality:   Presenting problems:  High  Diagnostic procedures:  Moderate  Management options:  Moderate  General Comments: EKG: SR 107, no change from prior.  Progress:   Patient progress:  Stable      Procedures

## 2013-02-08 NOTE — Progress Notes (Signed)
Met with patient in the ER.  Patient states she lives in an apartment with a roommate.  Patient states she has two grown children that live locally.  Patient states she utilizes the public bus or [employer] for transport.  Patient states she has been on disability (r/t factor V) since 2002.  Patient states she sees Dr Anola Gurney (Hem) and Dr Quentin Angst (PCP) and [facility].  Patient states her frequent visits are related to her PCP telling her to come on over to ER when she call them "sick" because of her health hx.  Patient c/o CP and SOB today.

## 2013-02-08 NOTE — ED Notes (Signed)
Pt back in room

## 2013-02-08 NOTE — ED Notes (Signed)
Pt ambulatory to lobby to wait for transport. Pt nad

## 2013-02-08 NOTE — ED Notes (Signed)
I have reviewed discharge instructions with the patient.  The patient verbalized understanding. prescriptions given to pt. Pt waiting shot time before leaving. Pt not in any distress.

## 2013-02-08 NOTE — ED Notes (Signed)
Pt states let the doctor know that the pain is not all the way gone. Dr. Grace Isaac was notified

## 2013-02-23 NOTE — Telephone Encounter (Signed)
Pt was referred to psych in October. Pt has yet to have an appt bc of her secondary medicaid. I found an office that will take medicaid so it was sent today. Pt is requesting refill on seroquel and cymbalta until she can get in. Thanks

## 2013-02-27 LAB — EKG, 12 LEAD, INITIAL
Atrial Rate: 74 {beats}/min
Calculated P Axis: 48 degrees
Calculated R Axis: 80 degrees
Calculated T Axis: 71 degrees
P-R Interval: 164 ms
Q-T Interval: 372 ms
QRS Duration: 90 ms
QTC Calculation (Bezet): 412 ms
Ventricular Rate: 74 {beats}/min

## 2013-02-27 LAB — CBC WITH AUTOMATED DIFF
ABS. BASOPHILS: 0 10*3/uL (ref 0.0–0.2)
ABS. EOSINOPHILS: 0 10*3/uL (ref 0.0–0.8)
ABS. IMM. GRANS.: 0 10*3/uL (ref 0.0–0.5)
ABS. LYMPHOCYTES: 1.3 10*3/uL (ref 0.5–4.6)
ABS. MONOCYTES: 0.3 10*3/uL (ref 0.1–1.3)
ABS. NEUTROPHILS: 3.8 10*3/uL (ref 1.7–8.2)
BASOPHILS: 1 % (ref 0.0–2.0)
EOSINOPHILS: 0 % — ABNORMAL LOW (ref 0.5–7.8)
HCT: 42.2 % (ref 35.8–46.3)
HGB: 14.7 g/dL (ref 11.7–15.4)
IMMATURE GRANULOCYTES: 0.2 % (ref 0.0–5.0)
LYMPHOCYTES: 23 % (ref 13–44)
MCH: 31.8 PG (ref 26.1–32.9)
MCHC: 34.8 g/dL (ref 31.4–35.0)
MCV: 91.3 FL (ref 79.6–97.8)
MONOCYTES: 6 % (ref 4.0–12.0)
MPV: 9.6 FL — ABNORMAL LOW (ref 10.8–14.1)
NEUTROPHILS: 70 % (ref 43–78)
PLATELET: 499 10*3/uL — ABNORMAL HIGH (ref 150–450)
RBC: 4.62 M/uL (ref 4.05–5.25)
RDW: 13.2 % (ref 11.9–14.6)
WBC: 5.4 10*3/uL (ref 4.3–11.1)

## 2013-02-27 LAB — METABOLIC PANEL, BASIC
Anion gap: 10 mmol/L (ref 7–16)
BUN: 11 MG/DL (ref 6–23)
CO2: 24 mmol/L (ref 21–32)
Calcium: 10.4 MG/DL (ref 8.3–10.4)
Chloride: 102 mmol/L (ref 98–107)
Creatinine: 0.61 MG/DL (ref 0.6–1.0)
GFR est AA: >60 mL/min/{1.73_m2} (ref >60)
GFR est non-AA: >60 mL/min/{1.73_m2} (ref >60)
Glucose: 97 mg/dL (ref 65–100)
Potassium: 3.6 mmol/L (ref 3.5–5.1)
Sodium: 136 mmol/L (ref 136–145)

## 2013-02-27 LAB — POC TROPONIN: Troponin-I (POC): 0 ng/ml (ref 0.0–0.08)

## 2013-02-27 MED ORDER — DIPHENHYDRAMINE HCL 50 MG/ML IJ SOLN
50 mg/mL | INTRAMUSCULAR | Status: AC
Start: 2013-02-27 — End: 2013-02-27
  Administered 2013-02-27: 17:00:00 via INTRAVENOUS

## 2013-02-27 MED ORDER — SODIUM CHLORIDE 0.9 % IJ SYRG
INTRAMUSCULAR | Status: DC | PRN
Start: 2013-02-27 — End: 2013-02-27

## 2013-02-27 MED ORDER — MORPHINE 4 MG/ML SYRINGE
4 mg/mL | INTRAMUSCULAR | Status: AC
Start: 2013-02-27 — End: 2013-02-27
  Administered 2013-02-27: 17:00:00 via INTRAVENOUS

## 2013-02-27 MED ORDER — SODIUM CHLORIDE 0.9 % IJ SYRG
Freq: Three times a day (TID) | INTRAMUSCULAR | Status: DC
Start: 2013-02-27 — End: 2013-02-27

## 2013-02-27 MED ORDER — HEPARIN, PORCINE (PF) 100 UNIT/ML IV SYRINGE
100 unit/mL | INTRAVENOUS | Status: DC | PRN
Start: 2013-02-27 — End: 2013-02-27
  Administered 2013-02-27: 19:00:00

## 2013-02-27 MED ORDER — ONDANSETRON (PF) 4 MG/2 ML INJECTION
4 mg/2 mL | INTRAMUSCULAR | Status: AC
Start: 2013-02-27 — End: 2013-02-27
  Administered 2013-02-27: 17:00:00 via INTRAVENOUS

## 2013-02-27 MED FILL — MORPHINE 4 MG/ML SYRINGE: 4 mg/mL | INTRAMUSCULAR | Qty: 1

## 2013-02-27 MED FILL — DIPHENHYDRAMINE HCL 50 MG/ML IJ SOLN: 50 mg/mL | INTRAMUSCULAR | Qty: 1

## 2013-02-27 MED FILL — ONDANSETRON (PF) 4 MG/2 ML INJECTION: 4 mg/2 mL | INTRAMUSCULAR | Qty: 2

## 2013-02-27 MED FILL — MONOJECT PREFILL ADVANCED (PF) 100 UNIT/ML INTRAVENOUS SYRINGE: 100 unit/mL | INTRAVENOUS | Qty: 3

## 2013-02-27 NOTE — ED Provider Notes (Signed)
HPI Comments: 14 yowf with h/o chronic PE and chronic CP presents with sharp left sided pleuritic CP onset upon waking this morning. No fever, vomiting or SOB. Pt was taken off coumadin 1 week ago and placed on lovenox. Pain radiates to left scapula. Pt reports this is the same CP she has had off and on for years.     Patient is a 48 y.o. female presenting with chest pain. [name]   Chest Pain (Angina)          Past Medical History   Diagnosis Date   ??? Factor V Leiden mutation    ??? Infectious disease 02/2009      Hx MRSA / E.Coli bacteremia, MRSA wound infection 2/2 port   ??? Factor II deficiency    ??? Calculus of kidney    ??? Depression    ??? Iron deficiency    ??? Migraines    ??? Pulmonary embolism Multiple episodes     x 16 events - recent hospitalizations 3/14        Past Surgical History   Procedure Laterality Date   ??? Cystoscopy  ???     x 13   ??? Lithotripsy  2006   ??? Hx vascular access  July, 2010     Power Port placed in [place], [place] removed   ??? Hx vascular access Right 2013     pt has current rt subclavian port - [facility]   ??? Hx appendectomy  ???   ??? Hx tonsillectomy     ??? Hx cesarean section     ??? Hx tubal ligation           Family History   Problem Relation Age of Onset   ??? Colon Cancer Father 20     Passed away due to ca   ??? Bleeding Prob Father      Factor V   ??? Cancer Maternal Grandmother      metastatic breast CA.   ??? Ovarian Cancer Mother 46     passed away due to ca   ??? Bipolar Disorder Mother    ??? Kidney Disease Brother      kidney stones        History     Social History   ??? Marital Status: DIVORCED     Spouse Name: N/A     Number of Children: N/A   ??? Years of Education: N/A     Occupational History   ??? Not on file.     Social History Main Topics   ??? Smoking status: Never Smoker    ??? Smokeless tobacco: Never Used   ??? Alcohol Use: Yes      Comment: rarely   ??? Drug Use: No      Comment: narcotic seeking per hospital hx.   ??? Sexually Active: Yes --  Female partner(s)     Birth Control/ Protection: Surgical     Other Topics Concern   ??? Not on file     Social History Narrative   ??? No narrative on file                  ALLERGIES: Ativan; Codeine; Iodinated contrast media - iv dye; Ketorolac tromethamine; Motrin; Pcn; Pneumococcal vaccine; Pneumovax 23; and Shellfish containing products      Review of Systems   Cardiovascular: Positive for chest pain.   All other systems reviewed and are negative.  Filed Vitals:    02/27/13 1100   BP: 136/69   Pulse: 79   Temp: 98.6 ??F (37 ??C)   Resp: 18   Height: 5\' 4"  (1.626 m)   Weight: 63.957 kg (141 lb)   SpO2: 100%            Physical Exam   Nursing note and vitals reviewed.  Constitutional: She is oriented to person, place, and time. She appears well-developed and well-nourished. No distress.   HENT:   Head: Normocephalic and atraumatic.   Mouth/Throat: Oropharynx is clear and moist.   Eyes: Conjunctivae are normal. Pupils are equal, round, and reactive to light.   Neck: Normal range of motion. Neck supple.   Cardiovascular: Normal rate and regular rhythm.    Pulmonary/Chest: Effort normal and breath sounds normal. She has no wheezes.   Abdominal: Soft. She exhibits no distension. There is no tenderness.   Musculoskeletal: Normal range of motion. She exhibits no edema and no tenderness.   Neurological: She is alert and oriented to person, place, and time.   Skin: Skin is warm and dry.   Psychiatric: She has a normal mood and affect.        MDM     Differential Diagnosis; Clinical Impression; Plan:     EKG, CXR and labs nml. Pt is already on appropriate tx for PE and has no clinical signs or VS changes to suggest large PE. Do not feel CT appropriate considering the number of scans she has had in the past. Pt appears safe for dc home with pain control.  Amount and/or Complexity of Data Reviewed:   Clinical lab tests:  Ordered and reviewed  Tests in the radiology section of CPT??:  Ordered and reviewed  Tests in the medicine  section of the CPT??:  Ordered and reviewed   Independant visualization of image, tracing, or specimen:  Yes  Risk of Significant Complications, Morbidity, and/or Mortality:   Presenting problems:  Moderate  Diagnostic procedures:  Low  Management options:  Low  Progress:   Patient progress:  Stable      Procedures

## 2013-02-27 NOTE — ED Notes (Signed)
Pt reports L upper CP that radiates into her L shoulder and back since 0530 this am, no meds attempted at home, pain described as constant and stabbing.  R CW port accessed and ordered meds provided, pain currently 7.5-8/10, pain increases w/ deep inspiration though no pain upon palpation, will cont to monitor.

## 2013-02-27 NOTE — ED Notes (Signed)
I have reviewed discharge instructions with the patient.  The patient verbalized understanding.  Pt ambulated to lobby unassisted, [facility] contacted to transport pt home, reference # [id number].

## 2013-02-27 NOTE — ED Notes (Signed)
Pain decreased to 5/10, will cont to monitor.

## 2013-02-27 NOTE — ED Notes (Signed)
Reports chest pain and weakness since this AM.  Reports history of multiple PE's.

## 2013-02-27 NOTE — ED Notes (Signed)
No change, pt remains resting quietly w/ eyes open, NAD, will cont monitor.

## 2013-03-04 NOTE — Telephone Encounter (Signed)
Thank you. Please get any records from them as well.   Charo Philipp S Keifer Habib, DO

## 2013-03-04 NOTE — Telephone Encounter (Signed)
Left message for her to call back.     **Her hospital doctor from [place] called today (03/04/13).  She was concerned that the patient is not telling her the whole truth concerning her past PEs, her port, and her medication.  The doctor says the patient's records do not add up with what the patient says and she is also requesting pain medication, which they will not give her.  Her doctor there also requested she have a f/u with us immediately.  She is already scheduled for Wednesday. 03/09/13.

## 2013-03-09 LAB — URINE MICROSCOPIC: RBC: >100 /hpf — ABNORMAL HIGH

## 2013-03-09 MED ORDER — ONDANSETRON 4 MG TAB, RAPID DISSOLVE
4 mg | ORAL | Status: AC
Start: 2013-03-09 — End: 2013-03-09
  Administered 2013-03-09: via ORAL

## 2013-03-09 MED ORDER — HYDROMORPHONE (PF) 1 MG/ML IJ SOLN
1 mg/mL | INTRAMUSCULAR | Status: AC
Start: 2013-03-09 — End: 2013-03-09
  Administered 2013-03-09: via INTRAMUSCULAR

## 2013-03-09 MED FILL — HYDROMORPHONE (PF) 1 MG/ML IJ SOLN: 1 mg/mL | INTRAMUSCULAR | Qty: 2

## 2013-03-09 MED FILL — ONDANSETRON 4 MG TAB, RAPID DISSOLVE: 4 mg | ORAL | Qty: 1

## 2013-03-09 NOTE — Progress Notes (Signed)
Pt was scheduled for ER hospital follow up on Wed 03-09-13 but as of today has r/s appt to next week.

## 2013-03-09 NOTE — ED Notes (Signed)
Pt's ride arrived and came to room. I have reviewed discharge instructions with the patient.  The patient verbalized understanding of instructions, prescriptions (3) and follow up with urologist. Pt ambulated out, with friend, in nad

## 2013-03-09 NOTE — ED Provider Notes (Signed)
HPI Comments: Feels same as prior kidney stones. Has appt with urologist on Monday. Blood in urine and hesitancy. No fever.     Patient is a 48 y.o. female presenting with flank pain. [name]   Flank Pain   This is a new problem. The current episode started 3 to 5 hours ago. The problem has not changed since onset.The problem occurs constantly. The pain is associated with no known injury. The pain is present in the left side. The quality of the pain is described as similar to previous episodes and stabbing. The pain radiates to the left groin. The pain is severe. The pain is the same all the time. Associated symptoms include dysuria. Pertinent negatives include no chest pain, no fever, no headaches, no abdominal pain and no leg pain. She has tried nothing for the symptoms. Risk factors include history of kidney stones.        Past Medical History   Diagnosis Date   ??? Factor V Leiden mutation    ??? Infectious disease 02/2009      Hx MRSA / E.Coli bacteremia, MRSA wound infection 2/2 port   ??? Factor II deficiency    ??? Calculus of kidney    ??? Depression    ??? Iron deficiency    ??? Migraines    ??? Pulmonary embolism Multiple episodes     x 16 events - recent hospitalizations 3/14        Past Surgical History   Procedure Laterality Date   ??? Cystoscopy  ???     x 13   ??? Lithotripsy  2006   ??? Hx vascular access  July, 2010     [place]   ??? Hx vascular access Right 2013     pt has current rt [facility]clavian port [facility]   ??? Hx appendectomy  ???   ??? Hx tonsillectomy     ??? Hx cesarean section     ??? Hx tubal ligation           Family History   Problem Relation Age of Onset   ??? Colon Cancer Father [id number]     Passed away due to ca   ??? Bleeding Prob Father      Factor V   ??? Cancer Maternal Grandmother      metastatic breast CA.   ??? Ovarian Cancer Mother [phone]     passed away due to ca   ??? Bipolar Disorder Mother    ??? Kidney Disease Brother      kidney  stones        History     Social History   ??? Marital Status: DIVORCED     Spouse Name: N/A     Number of Children: N/A   ??? Years of Education: N/A     Occupational History   ??? Not on file.     Social History Main Topics   ??? Smoking status: Never Smoker    ??? Smokeless tobacco: Never Used   ??? Alcohol Use: Yes      Comment: rarely   ??? Drug Use: No      Comment: narcotic seeking per hospital hx.   ??? Sexually Active: Yes -- Female partner(s)     Birth Control/ Protection: Surgical     Other Topics Concern   ??? Not on file     Social History Narrative   ??? No narrative on  file                  ALLERGIES: Ativan; Codeine; Iodinated contrast media - iv dye; Ketorolac tromethamine; Motrin; Pcn; Pneumococcal vaccine; Pneumovax 23; and Shellfish containing products      Review of Systems   Constitutional: Negative for fever.   HENT: Negative for hearing loss.    Eyes: Negative for visual disturbance.   Respiratory: Negative for cough and shortness of breath.    Cardiovascular: Negative for chest pain.   Gastrointestinal: Positive for nausea and vomiting. Negative for abdominal pain.   Genitourinary: Positive for dysuria, hematuria, flank pain and difficulty urinating.   Musculoskeletal: Positive for back pain.   Skin: Negative for rash.   Neurological: Negative for headaches.   Psychiatric/Behavioral: Negative for confusion.       Filed Vitals:    03/09/13 1621   BP: 116/77   Pulse: 95   Temp: 98.6 ??F (37 ??C)   Resp: 16   Height: 5\' 4"  (1.626 m)   Weight: 63.957 kg (141 lb)   SpO2: 99%            Physical Exam   Nursing note and vitals reviewed.  Constitutional: She appears well-developed and well-nourished.   HENT:   Head: Normocephalic and atraumatic.   Right Ear: External ear normal.   Left Ear: External ear normal.   Nose: Nose normal.   Mouth/Throat: Oropharynx is clear and moist.   Eyes: Conjunctivae are normal. Pupils are equal, round, and reactive to light.   Neck: Normal range of motion. Neck supple.   Cardiovascular:  Regular rhythm, normal heart sounds and intact distal pulses.    Pulmonary/Chest: Effort normal and breath sounds normal. No respiratory distress. She has no wheezes.   Abdominal: Soft. Bowel sounds are normal. She exhibits no distension. There is no tenderness. There is no rigidity, no rebound, no guarding and no CVA tenderness.   Musculoskeletal: Normal range of motion. She exhibits no edema.   Neurological: She is alert.   Skin: Skin is warm and dry.   Psychiatric: Judgment normal.        MDM     Amount and/or Complexity of Data Reviewed:   Clinical lab tests:  Ordered and reviewed  Tests in the medicine section of the CPT??:  Ordered  Risk of Significant Complications, Morbidity, and/or Mortality:   Presenting problems:  Moderate  Diagnostic procedures:  Low  Management options:  Moderate  General Comments: Multiple prior CT scans. Does not appear dehydrated. Will treat pain and fu with urologist.  Progress:   Patient progress:  Stable      Procedures

## 2013-03-20 LAB — COMPREHENSIVE METABOLIC PANEL
ALK PHOS: 178 U/L — AB
ALT: 44 U/L (ref 12–78)
AST: 28 U/L (ref 15–37)
Albumin: 3.8 g/dL (ref 3.4–5.0)
Anion Gap: 7 (ref 7–16)
BILIRUBIN TOTAL: 0.2 mg/dL (ref 0.2–1.0)
BUN: 17 mg/dL (ref 7–18)
CO2: 26 mmol/L (ref 21–32)
CREATININE: 0.88 mg/dL (ref 0.60–1.30)
Calcium, Total: 9 mg/dL (ref 8.5–10.1)
Chloride: 108 mmol/L — ABNORMAL HIGH (ref 98–107)
EGFR (African American): >60
EGFR (Non-African Amer.): >60
Glucose: 90 mg/dL (ref 65–99)
Osmolality: 282 (ref 275–301)
POTASSIUM: 3.9 mmol/L (ref 3.5–5.1)
SODIUM: 141 mmol/L (ref 136–145)
TOTAL PROTEIN: 6.9 g/dL (ref 6.4–8.2)

## 2013-03-20 LAB — CBC
HCT: 30.9 % — AB (ref 35.0–47.0)
HGB: 10.3 g/dL — ABNORMAL LOW (ref 12.0–16.0)
MCH: 31.3 pg (ref 26.0–34.0)
MCHC: 33.2 g/dL (ref 32.0–36.0)
MCV: 94 fL (ref 80–100)
Platelet: 199 10*3/uL (ref 150–440)
RBC: 3.28 10*6/uL — ABNORMAL LOW (ref 3.80–5.20)
RDW: 14.3 % (ref 11.5–14.5)
WBC: 5.4 10*3/uL (ref 3.6–11.0)

## 2013-03-20 LAB — CK TOTAL AND CKMB (NOT AT ARMC)
CK, TOTAL: 439 U/L — AB
CK-MB: 4.4 ng/mL — ABNORMAL HIGH (ref 0.5–3.6)

## 2013-03-20 LAB — APTT
Activated PTT: 35.3 secs (ref 23.6–35.9)
Activated PTT: >160 secs (ref 23.6–35.9)

## 2013-03-20 LAB — TROPONIN I
TROPONIN-I: 0.1 ng/mL — AB
TROPONIN-I: 0.07 ng/mL — AB
Troponin-I: 0.05 ng/mL

## 2013-03-20 LAB — PROTIME-INR
INR: 1.2
PROTHROMBIN TIME: 15.2 s — AB (ref 11.5–14.7)

## 2013-03-20 LAB — HCG, QUANTITATIVE, PREGNANCY: Beta Hcg, Quant.: 1 m[IU]/mL

## 2013-03-21 LAB — BASIC METABOLIC PANEL
ANION GAP: 4 — AB (ref 7–16)
BUN: 19 mg/dL — AB (ref 7–18)
CALCIUM: 9.4 mg/dL (ref 8.5–10.1)
CHLORIDE: 109 mmol/L — AB (ref 98–107)
CO2: 28 mmol/L (ref 21–32)
Creatinine: 0.88 mg/dL (ref 0.60–1.30)
EGFR (African American): >60
EGFR (Non-African Amer.): >60
Glucose: 151 mg/dL — ABNORMAL HIGH (ref 65–99)
Osmolality: 286 (ref 275–301)
POTASSIUM: 4.2 mmol/L (ref 3.5–5.1)
SODIUM: 141 mmol/L (ref 136–145)

## 2013-03-21 LAB — CBC WITH DIFFERENTIAL/PLATELET
BASOS ABS: 0 10*3/uL (ref 0.0–0.1)
Basophil %: 0.2 %
EOS PCT: 0.2 %
Eosinophil #: 0 10*3/uL (ref 0.0–0.7)
HCT: 33.6 % — ABNORMAL LOW (ref 35.0–47.0)
HGB: 11.3 g/dL — ABNORMAL LOW (ref 12.0–16.0)
LYMPHS PCT: 7.1 %
Lymphocyte #: 0.4 10*3/uL — ABNORMAL LOW (ref 1.0–3.6)
MCH: 32.2 pg (ref 26.0–34.0)
MCHC: 33.7 g/dL (ref 32.0–36.0)
MCV: 96 fL (ref 80–100)
Monocyte #: 0 x10 3/mm — ABNORMAL LOW (ref 0.2–0.9)
Monocyte %: 0.8 %
NEUTROS PCT: 91.7 %
Neutrophil #: 4.6 10*3/uL (ref 1.4–6.5)
Platelet: 229 10*3/uL (ref 150–440)
RBC: 3.51 10*6/uL — ABNORMAL LOW (ref 3.80–5.20)
RDW: 14 % (ref 11.5–14.5)
WBC: 5.1 10*3/uL (ref 3.6–11.0)

## 2013-03-21 LAB — LIPID PANEL
Cholesterol: 207 mg/dL — ABNORMAL HIGH (ref 0–200)
HDL Cholesterol: 62 mg/dL — ABNORMAL HIGH (ref 40–60)
LDL CHOLESTEROL, CALC: 128 mg/dL — AB (ref 0–100)
Triglycerides: 87 mg/dL (ref 0–200)
VLDL Cholesterol, Calc: 17 mg/dL (ref 5–40)

## 2013-03-21 LAB — APTT
ACTIVATED PTT: 52.3 s — AB (ref 23.6–35.9)
Activated PTT: 52.8 secs — ABNORMAL HIGH (ref 23.6–35.9)
Activated PTT: 48.7 secs — ABNORMAL HIGH (ref 23.6–35.9)

## 2013-03-21 LAB — PROTIME-INR
INR: 1.2
PROTHROMBIN TIME: 14.7 s (ref 11.5–14.7)

## 2013-03-22 LAB — HEPATIC FUNCTION PANEL A (ARMC)
AST: 22 U/L (ref 15–37)
Albumin: 3.4 g/dL (ref 3.4–5.0)
Alkaline Phosphatase: 135 U/L — ABNORMAL HIGH
Bilirubin, Direct: <0.1 mg/dL (ref 0.00–0.20)
Bilirubin,Total: 0.2 mg/dL (ref 0.2–1.0)
SGPT (ALT): 30 U/L (ref 12–78)
Total Protein: 6.5 g/dL (ref 6.4–8.2)

## 2013-03-22 LAB — DRUG SCREEN, URINE
Amphetamines, Ur Screen: NEGATIVE (ref ?–1000)
BARBITURATES, UR SCREEN: NEGATIVE (ref ?–200)
Benzodiazepine, Ur Scrn: POSITIVE (ref ?–200)
COCAINE METABOLITE, UR ~~LOC~~: NEGATIVE (ref ?–300)
Cannabinoid 50 Ng, Ur ~~LOC~~: NEGATIVE (ref ?–50)
MDMA (ECSTASY) UR SCREEN: NEGATIVE (ref ?–500)
METHADONE, UR SCREEN: NEGATIVE (ref ?–300)
OPIATE, UR SCREEN: POSITIVE (ref ?–300)
Phencyclidine (PCP) Ur S: NEGATIVE (ref ?–25)
Tricyclic, Ur Screen: POSITIVE (ref ?–1000)

## 2013-03-22 LAB — APTT: Activated PTT: 138.8 secs — ABNORMAL HIGH (ref 23.6–35.9)

## 2013-03-23 ENCOUNTER — Inpatient Hospital Stay: Payer: Self-pay | Admitting: Internal Medicine

## 2013-03-23 LAB — BASIC METABOLIC PANEL
Anion Gap: 2 — ABNORMAL LOW (ref 7–16)
BUN: 16 mg/dL (ref 7–18)
CO2: 34 mmol/L — AB (ref 21–32)
Calcium, Total: 8.8 mg/dL (ref 8.5–10.1)
Chloride: 108 mmol/L — ABNORMAL HIGH (ref 98–107)
Creatinine: 0.9 mg/dL (ref 0.60–1.30)
EGFR (African American): >60
EGFR (Non-African Amer.): >60
Glucose: 89 mg/dL (ref 65–99)
OSMOLALITY: 287 (ref 275–301)
Potassium: 3.8 mmol/L (ref 3.5–5.1)
Sodium: 144 mmol/L (ref 136–145)

## 2013-03-23 LAB — CBC WITH DIFFERENTIAL/PLATELET
Basophil #: 0 10*3/uL (ref 0.0–0.1)
Basophil %: 0.3 %
EOS ABS: 0.1 10*3/uL (ref 0.0–0.7)
Eosinophil %: 1.8 %
HCT: 29.6 % — AB (ref 35.0–47.0)
HGB: 9.8 g/dL — ABNORMAL LOW (ref 12.0–16.0)
Lymphocyte #: 1.9 10*3/uL (ref 1.0–3.6)
Lymphocyte %: 33 %
MCH: 32.1 pg (ref 26.0–34.0)
MCHC: 33.1 g/dL (ref 32.0–36.0)
MCV: 97 fL (ref 80–100)
Monocyte #: 0.6 x10 3/mm (ref 0.2–0.9)
Monocyte %: 10.5 %
NEUTROS PCT: 54.4 %
Neutrophil #: 3.1 10*3/uL (ref 1.4–6.5)
PLATELETS: 184 10*3/uL (ref 150–440)
RBC: 3.06 10*6/uL — ABNORMAL LOW (ref 3.80–5.20)
RDW: 14.5 % (ref 11.5–14.5)
WBC: 5.7 10*3/uL (ref 3.6–11.0)

## 2013-03-23 LAB — PROTIME-INR
INR: 3.9
Prothrombin Time: 37.3 secs — ABNORMAL HIGH (ref 11.5–14.7)

## 2013-03-24 LAB — PROTIME-INR
INR: 3.5
Prothrombin Time: 34.1 secs — ABNORMAL HIGH (ref 11.5–14.7)

## 2013-03-24 LAB — HEMOGLOBIN: HGB: 10.5 g/dL — AB (ref 12.0–16.0)

## 2013-03-24 LAB — PLATELET COUNT: Platelet: 221 10*3/uL (ref 150–440)

## 2013-03-31 MED ORDER — SODIUM CHLORIDE 0.9% BOLUS IV
0.9 % | Freq: Once | INTRAVENOUS | Status: DC
Start: 2013-03-31 — End: 2013-03-31

## 2013-03-31 MED ORDER — ONDANSETRON (PF) 4 MG/2 ML INJECTION
4 mg/2 mL | INTRAMUSCULAR | Status: DC
Start: 2013-03-31 — End: 2013-03-31

## 2013-03-31 MED ORDER — MORPHINE 4 MG/ML SYRINGE
4 mg/mL | INTRAMUSCULAR | Status: DC
Start: 2013-03-31 — End: 2013-03-31

## 2013-03-31 NOTE — ED Notes (Signed)
Lab to come draw blood, Dr. Lou Minerhorn states he will change medication to IM since we are unable to obtain IV access.

## 2013-03-31 NOTE — ED Notes (Signed)
Patient states having flank pain, history of kidney stones

## 2013-03-31 NOTE — ED Notes (Signed)
I have reviewed discharge instructions with the patient.  The patient verbalized understanding.

## 2013-03-31 NOTE — ED Provider Notes (Addendum)
Patient is a 48 y.o. female presenting with flank pain. [name]   Flank Pain   This is a recurrent (has known history of a stone in the right kidney that stays there.) problem. The problem has not changed since onset.The problem occurs constantly. The pain is associated with no known injury. The pain is present in the right side. The pain does not radiate. The pain is severe. Pertinent negatives include no fever, no abdominal pain and no abdominal swelling.        Past Medical History   Diagnosis Date   ??? Factor V Leiden mutation    ??? Infectious disease 02/2009      Hx MRSA / E.Coli bacteremia, MRSA wound infection 2/2 port   ??? Factor II deficiency    ??? Calculus of kidney    ??? Depression    ??? Iron deficiency    ??? Migraines    ??? Pulmonary embolism Multiple episodes     x 16 events - recent hospitalizations 3/14        Past Surgical History   Procedure Laterality Date   ??? Cystoscopy  ???     x 13   ??? Lithotripsy  2006   ??? Hx vascular access  July, 2010     Power Port placed in [place], [place]., removed   ??? Hx vascular access Right 2013     pt has current rt subclavian port - placed at [facility]   ??? Hx appendectomy  ???   ??? Hx tonsillectomy     ??? Hx cesarean section     ??? Hx tubal ligation           Family History   Problem Relation Age of Onset   ??? Colon Cancer Father 43     Passed away due to ca   ??? Bleeding Prob Father      Factor V   ??? Cancer Maternal Grandmother      metastatic breast CA.   ??? Ovarian Cancer Mother 26     passed away due to ca   ??? Bipolar Disorder Mother    ??? Kidney Disease Brother      kidney stones        History     Social History   ??? Marital Status: DIVORCED     Spouse Name: N/A     Number of Children: N/A   ??? Years of Education: N/A     Occupational History   ??? Not on file.     Social History Main Topics   ??? Smoking status: Never Smoker    ??? Smokeless tobacco: Never Used   ??? Alcohol Use: Yes      Comment: rarely   ??? Drug Use: No      Comment: narcotic  seeking per hospital hx.   ??? Sexual Activity:     Partners: Male     Birth Control/ Protection: Surgical     Other Topics Concern   ??? Not on file     Social History Narrative                  ALLERGIES: Ativan; Codeine; Iodinated contrast media - iv dye; Ketorolac tromethamine; Motrin; Pcn; Pneumococcal vaccine; Pneumovax 23; and Shellfish containing products      Review of Systems   Constitutional: Negative for fever and chills.   Respiratory: Negative for shortness of breath and wheezing.    Gastrointestinal: Negative for vomiting and  abdominal pain.   Genitourinary: Positive for flank pain.   Skin: Negative.    All other systems reviewed and are negative.      Filed Vitals:    03/31/13 1808   BP: 102/75   Pulse: 110   Temp: 98.3 ??F (36.8 ??C)   Resp: 18   Height: 5' 4" (1.626 m)   Weight: 63 kg (138 lb 14.2 oz)   SpO2: 99%            Physical Exam   Constitutional: She is oriented to person, place, and time. She appears well-developed and well-nourished. She appears distressed (pt rolling around in the bed in pain.).   HENT:   Head: Normocephalic and atraumatic.   Eyes: Conjunctivae are normal. Right eye exhibits no discharge. Left eye exhibits no discharge.   Neck: Neck supple.   Pulmonary/Chest: Effort normal. No stridor. No respiratory distress.   Abdominal: Soft. There is tenderness (right sided flank pain.). There is no rebound and no guarding.   Neurological: She is alert and oriented to person, place, and time.   No focal weakness speech normal   Skin: Skin is warm and dry.   Psychiatric: She has a normal mood and affect. Her behavior is normal.   Nursing note and vitals reviewed.       MDM  Number of Diagnoses or Management Options  Flank pain:   Diagnosis management comments: Pt has normal labs she has calmed down after pain medications I did [place] and did not see any hydronephrosis.         Amount and/or Complexity of Data Reviewed  Clinical lab tests: ordered and reviewed (Results for orders placed during  the hospital encounter of 03/31/13    -CBC WITH AUTOMATED DIFF     WBC K/uL                      7.3     Ref Range: 4.3 - 11.1 K/uL     RBC M/uL                      4.30    Ref Range: 4.05 - 5.25 M/uL     HGB g/dL                      13.5    Ref Range: 11.7 - 15.4 g/dL     HCT %                         40.7    Ref Range: 35.8 - 46.3 %     MCV FL                        94.7    Ref Range: 79.6 - 97.8 FL     MCH PG                        31.4    Ref Range: 26.1 - 32.9 PG     MCHC g/dL                     33.2    Ref Range: 31.4 - 35.0 g/dL     RDW %                         13.6  Ref Range: 11.9 - 14.6 %     PLATELET K/uL                 382     Ref Range: 150 - 450 K/uL     MPV FL                        10.7 (*)Ref Range: 10.8 - 14.1 FL     DF                            AUTOMATED                NEUTROPHILS %                 46      Ref Range: 43 - 78 %     LYMPHOCYTES %                 39      Ref Range: 13 - 44 %     MONOCYTES %                   9       Ref Range: 4.0 - 12.0 %     EOSINOPHILS %                 6       Ref Range: 0.5 - 7.8 %     BASOPHILS %                   0       Ref Range: 0.0 - 2.0 %     IMMATURE GRANULOCYTES %       0.3     Ref Range: 0.0 - 5.0 %     ABS. NEUTROPHILS K/UL         3.3     Ref Range: 1.7 - 8.2 K/UL     ABS. LYMPHOCYTES K/UL         2.9     Ref Range: 0.5 - 4.6 K/UL     ABS. MONOCYTES K/UL           0.7     Ref Range: 0.1 - 1.3 K/UL     ABS. EOSINOPHILS K/UL         0.4     Ref Range: 0.0 - 0.8 K/UL     ABS. BASOPHILS K/UL           0.0     Ref Range: 0.0 - 0.2 K/UL     ABS. IMM. GRANS. K/UL         0.0     Ref Range: 0.0 - 0.5 K/UL    -METABOLIC PANEL, COMPREHENSIVE     Sodium mmol/L                 135 (*) Ref Range: 136 - 145 mmol/L     Potassium mmol/L              4.2     Ref Range: 3.5 - 5.1 mmol/L     Chloride mmol/L               101     Ref Range: 98 - 107 mmol/L     CO2 mmol/L  27      Ref Range: 21 - 32 mmol/L     Anion gap mmol/L              7        Ref Range: 7 - 16 mmol/L     Glucose mg/dL                 101 (*) Ref Range: 65 - 100 mg/dL     BUN MG/DL                     22      Ref Range: 6 - 23 MG/DL     Creatinine MG/DL              0.80    Ref Range: 0.6 - 1.0 MG/DL     GFR est AA ml/min/1.30m      >60     Ref Range: >60 ml/min/1.763m    GFR est non-AA ml/min/1.7380m>60     Ref Range: >60 ml/min/1.89m57m  Calcium MG/DL                 9.7     Ref Range: 8.3 - 10.4 MG/DL     Bilirubin, total MG/DL        0.3     Ref Range: 0.2 - 1.1 MG/DL     ALT U/L                       23      Ref Range: 12 - 65 U/L     AST U/L                       21      Ref Range: 15 - 37 U/L     Alk. phosphatase U/L          123     Ref Range: 50 - 136 U/L     Protein, total g/dL           7.2     Ref Range: 6.3 - 8.2 g/dL     Albumin g/dL                  4.3     Ref Range: 3.5 - 5.0 g/dL     Globulin g/dL                 2.9     Ref Range: 2.3 - 3.5 g/dL     A-G Ratio                     1.5     Ref Range: 1.2 - 3.5      -PROTHROMBIN TIME + INR     Prothrombin time sec          10.5    Ref Range: 9.6 - 12.0 sec     INR                           1.0     Ref Range: 0.9 - 1.2   )  Tests in the radiology section of CPT??: reviewed (Old ct reports.  )        Bedside US  [place]/Time: 03/31/2013 10:58 PM  Consent: Verbal consent obtained.  Consent given by: patient  Procedure Type: Abdominal  Indication: abdominal pain  Findings: No intra-abdominal free fluid (There is no hydronephrosis the right kidney has a stone that is visualized.)

## 2013-03-31 NOTE — ED Notes (Signed)
Pt right foot stuck for blood, lab notified that there was not enough for blue tube. Autumn is aware that she needs to come stick pt.

## 2013-03-31 NOTE — ED Notes (Signed)
Pt refuses to allow for peripheral IV attempt, states we can only access port. Risk of infection explained to pt and Dr. Lou Minerhorn consulted regarding situation.

## 2013-04-01 LAB — CBC WITH AUTOMATED DIFF
ABS. BASOPHILS: 0 10*3/uL (ref 0.0–0.2)
ABS. EOSINOPHILS: 0.4 10*3/uL (ref 0.0–0.8)
ABS. IMM. GRANS.: 0 10*3/uL (ref 0.0–0.5)
ABS. LYMPHOCYTES: 2.9 10*3/uL (ref 0.5–4.6)
ABS. MONOCYTES: 0.7 10*3/uL (ref 0.1–1.3)
ABS. NEUTROPHILS: 3.3 10*3/uL (ref 1.7–8.2)
BASOPHILS: 0 % (ref 0.0–2.0)
EOSINOPHILS: 6 % (ref 0.5–7.8)
HCT: 40.7 % (ref 35.8–46.3)
HGB: 13.5 g/dL (ref 11.7–15.4)
IMMATURE GRANULOCYTES: 0.3 % (ref 0.0–5.0)
LYMPHOCYTES: 39 % (ref 13–44)
MCH: 31.4 PG (ref 26.1–32.9)
MCHC: 33.2 g/dL (ref 31.4–35.0)
MCV: 94.7 FL (ref 79.6–97.8)
MONOCYTES: 9 % (ref 4.0–12.0)
MPV: 10.7 FL — ABNORMAL LOW (ref 10.8–14.1)
NEUTROPHILS: 46 % (ref 43–78)
PLATELET: 382 10*3/uL (ref 150–450)
RBC: 4.3 M/uL (ref 4.05–5.25)
RDW: 13.6 % (ref 11.9–14.6)
WBC: 7.3 10*3/uL (ref 4.3–11.1)

## 2013-04-01 LAB — METABOLIC PANEL, COMPREHENSIVE
A-G Ratio: 1.5 (ref 1.2–3.5)
ALT (SGPT): 23 U/L (ref 12–65)
AST (SGOT): 21 U/L (ref 15–37)
Albumin: 4.3 g/dL (ref 3.5–5.0)
Alk. phosphatase: 123 U/L (ref 50–136)
Anion gap: 7 mmol/L (ref 7–16)
BUN: 22 MG/DL (ref 6–23)
Bilirubin, total: 0.3 MG/DL (ref 0.2–1.1)
CO2: 27 mmol/L (ref 21–32)
Calcium: 9.7 MG/DL (ref 8.3–10.4)
Chloride: 101 mmol/L (ref 98–107)
Creatinine: 0.8 MG/DL (ref 0.6–1.0)
GFR est AA: >60 mL/min/{1.73_m2} (ref >60)
GFR est non-AA: >60 mL/min/{1.73_m2} (ref >60)
Globulin: 2.9 g/dL (ref 2.3–3.5)
Glucose: 101 mg/dL — ABNORMAL HIGH (ref 65–100)
Potassium: 4.2 mmol/L (ref 3.5–5.1)
Protein, total: 7.2 g/dL (ref 6.3–8.2)
Sodium: 135 mmol/L — ABNORMAL LOW (ref 136–145)

## 2013-04-01 LAB — PROTHROMBIN TIME + INR
INR: 1 (ref 0.9–1.2)
Prothrombin time: 10.5 s (ref 9.6–12.0)

## 2013-04-01 MED ORDER — DIPHENHYDRAMINE 25 MG CAP
25 mg | ORAL | Status: AC
Start: 2013-04-01 — End: 2013-03-31
  Administered 2013-04-01: 02:00:00 via ORAL

## 2013-04-01 MED ORDER — MORPHINE 4 MG/ML SYRINGE
4 mg/mL | INTRAMUSCULAR | Status: AC
Start: 2013-04-01 — End: 2013-03-31
  Administered 2013-04-01: 01:00:00 via INTRAMUSCULAR

## 2013-04-01 MED ORDER — ONDANSETRON 8 MG TAB, RAPID DISSOLVE
8 mg | ORAL | Status: AC
Start: 2013-04-01 — End: 2013-03-31
  Administered 2013-04-01: 01:00:00 via ORAL

## 2013-04-01 MED FILL — DIPHENHYDRAMINE 25 MG CAP: 25 mg | ORAL | Qty: 1

## 2013-04-01 MED FILL — ONDANSETRON 8 MG TAB, RAPID DISSOLVE: 8 mg | ORAL | Qty: 1

## 2013-04-01 MED FILL — MORPHINE 4 MG/ML SYRINGE: 4 mg/mL | INTRAMUSCULAR | Qty: 1

## 2013-04-14 LAB — CBC WITH AUTOMATED DIFF
ABS. BASOPHILS: 0 10*3/uL (ref 0.0–0.2)
ABS. EOSINOPHILS: 0.5 10*3/uL (ref 0.0–0.8)
ABS. IMM. GRANS.: 0 10*3/uL (ref 0.0–0.5)
ABS. LYMPHOCYTES: 1.9 10*3/uL (ref 0.5–4.6)
ABS. MONOCYTES: 0.5 10*3/uL (ref 0.1–1.3)
ABS. NEUTROPHILS: 2.7 10*3/uL (ref 1.7–8.2)
BASOPHILS: 0 % (ref 0.0–2.0)
EOSINOPHILS: 9 % — ABNORMAL HIGH (ref 0.5–7.8)
HCT: 37.1 % (ref 35.8–46.3)
HGB: 12.1 g/dL (ref 11.7–15.4)
IMMATURE GRANULOCYTES: 0.4 % (ref 0.0–5.0)
LYMPHOCYTES: 34 % (ref 13–44)
MCH: 30.9 PG (ref 26.1–32.9)
MCHC: 32.6 g/dL (ref 31.4–35.0)
MCV: 94.6 FL (ref 79.6–97.8)
MONOCYTES: 8 % (ref 4.0–12.0)
MPV: 10.5 FL — ABNORMAL LOW (ref 10.8–14.1)
NEUTROPHILS: 49 % (ref 43–78)
PLATELET: 263 10*3/uL (ref 150–450)
RBC: 3.92 M/uL — ABNORMAL LOW (ref 4.05–5.25)
RDW: 13.6 % (ref 11.9–14.6)
WBC: 5.6 10*3/uL (ref 4.3–11.1)

## 2013-04-14 LAB — METABOLIC PANEL, COMPREHENSIVE
A-G Ratio: 1.2 (ref 1.2–3.5)
ALT (SGPT): 18 U/L (ref 12–65)
AST (SGOT): 14 U/L — ABNORMAL LOW (ref 15–37)
Albumin: 3.6 g/dL (ref 3.5–5.0)
Alk. phosphatase: 100 U/L (ref 50–136)
Anion gap: 8 mmol/L (ref 7–16)
BUN: 9 MG/DL (ref 6–23)
Bilirubin, total: 0.2 MG/DL (ref 0.2–1.1)
CO2: 28 mmol/L (ref 21–32)
Calcium: 9.5 MG/DL (ref 8.3–10.4)
Chloride: 106 mmol/L (ref 98–107)
Creatinine: 0.8 MG/DL (ref 0.6–1.0)
GFR est AA: >60 mL/min/{1.73_m2} (ref >60)
GFR est non-AA: >60 mL/min/{1.73_m2} (ref >60)
Globulin: 3.1 g/dL (ref 2.3–3.5)
Glucose: 90 mg/dL (ref 65–100)
Potassium: 4.4 mmol/L (ref 3.5–5.1)
Protein, total: 6.7 g/dL (ref 6.3–8.2)
Sodium: 142 mmol/L (ref 136–145)

## 2013-04-14 LAB — LIPASE: Lipase: 117 U/L (ref 73–393)

## 2013-04-14 LAB — C REACTIVE PROTEIN, QT: C-Reactive protein: 0.5 mg/dL (ref 0.0–0.9)

## 2013-04-14 MED ORDER — SODIUM CHLORIDE 0.9% BOLUS IV
0.9 % | Freq: Once | INTRAVENOUS | Status: AC
Start: 2013-04-14 — End: 2013-04-14
  Administered 2013-04-14: 18:00:00 via INTRAVENOUS

## 2013-04-14 MED ORDER — MEPERIDINE (PF) 25 MG/ML INJ SOLUTION
25 mg/ml | INTRAMUSCULAR | Status: AC
Start: 2013-04-14 — End: 2013-04-14
  Administered 2013-04-14: 20:00:00 via INTRAMUSCULAR

## 2013-04-14 MED ORDER — TRAMADOL 50 MG TAB
50 mg | ORAL_TABLET | Freq: Four times a day (QID) | ORAL | Status: DC | PRN
Start: 2013-04-14 — End: 2013-04-26

## 2013-04-14 MED ORDER — DIPHENHYDRAMINE HCL 50 MG/ML IJ SOLN
50 mg/mL | INTRAMUSCULAR | Status: AC
Start: 2013-04-14 — End: 2013-04-14
  Administered 2013-04-14: 18:00:00 via INTRAVENOUS

## 2013-04-14 MED ORDER — MORPHINE 4 MG/ML SYRINGE
4 mg/mL | Freq: Once | INTRAMUSCULAR | Status: AC
Start: 2013-04-14 — End: 2013-04-14
  Administered 2013-04-14: 18:00:00 via INTRAVENOUS

## 2013-04-14 MED ORDER — SUCRALFATE 100 MG/ML ORAL SUSP
100 mg/mL | Freq: Four times a day (QID) | ORAL | Status: DC
Start: 2013-04-14 — End: 2013-04-26

## 2013-04-14 MED ORDER — HEPARIN, PORCINE (PF) 100 UNIT/ML IV SYRINGE
100 unit/mL | INTRAVENOUS | Status: DC | PRN
Start: 2013-04-14 — End: 2013-04-14
  Administered 2013-04-14: 21:00:00

## 2013-04-14 MED ORDER — ONDANSETRON (PF) 4 MG/2 ML INJECTION
4 mg/2 mL | INTRAMUSCULAR | Status: AC
Start: 2013-04-14 — End: 2013-04-14
  Administered 2013-04-14: 18:00:00 via INTRAVENOUS

## 2013-04-14 MED ORDER — HYOSCYAMINE 0.125 MG SUBLINGUAL TAB
0.125 mg | SUBLINGUAL | Status: AC
Start: 2013-04-14 — End: 2013-04-14
  Administered 2013-04-14: 18:00:00 via SUBLINGUAL

## 2013-04-14 MED FILL — DIPHENHYDRAMINE HCL 50 MG/ML IJ SOLN: 50 mg/mL | INTRAMUSCULAR | Qty: 1

## 2013-04-14 MED FILL — MORPHINE 4 MG/ML SYRINGE: 4 mg/mL | INTRAMUSCULAR | Qty: 1

## 2013-04-14 MED FILL — MONOJECT PREFILL ADVANCED (PF) 100 UNIT/ML INTRAVENOUS SYRINGE: 100 unit/mL | INTRAVENOUS | Qty: 3

## 2013-04-14 MED FILL — ONDANSETRON (PF) 4 MG/2 ML INJECTION: 4 mg/2 mL | INTRAMUSCULAR | Qty: 2

## 2013-04-14 MED FILL — HYOSCYAMINE 0.125 MG SUBLINGUAL TAB: 0.125 mg | SUBLINGUAL | Qty: 1

## 2013-04-14 MED FILL — MEPERIDINE (PF) 25MG/ML INJECTION: 25 mg/mL | INTRAMUSCULAR | Qty: 1

## 2013-04-14 NOTE — ED Notes (Signed)
Pt ambulatory to bathroom, instr to obtain urine spec.

## 2013-04-14 NOTE — ED Notes (Signed)
Pt here for abdominal pain and vomiting. States pain in upper right quadrant and moves to epigastric. Pt seen here for same before without definitive dx. Pt states she needs to be seen by GI.

## 2013-04-14 NOTE — ED Notes (Signed)
Discharge instr. And RX x 2 Given and reviewed with pt, questions answered and pt verbalized understanding. Ambulaotry out with out dif.

## 2013-04-14 NOTE — ED Provider Notes (Signed)
Patient is a 48 y.o. female presenting with abdominal pain. [name]   Abdominal Pain   This is a new problem. The current episode started more than 2 days ago. The problem occurs daily. The problem has not changed (constant since 3 am today) since onset.The pain is associated with an unknown factor. The pain is located in the epigastric region. The quality of the pain is cramping and colicky. The pain is at a severity of 8/10. The pain is severe. Associated symptoms include nausea and vomiting. Pertinent negatives include no anorexia, no fever, no belching, no diarrhea, no flatus, no hematochezia, no melena, no constipation, no dysuria, no frequency, no hematuria, no headaches, no arthralgias, no myalgias, no chest pain and no back pain. Nothing worsens the pain. The pain is relieved by nothing. Past workup includes CT scan and ultrasound. The patient's surgical history non-contributory.       Past Medical History   Diagnosis Date   ??? Factor V Leiden mutation    ??? Infectious disease 02/2009      Hx MRSA / E.Coli bacteremia, MRSA wound infection 2/2 port   ??? Factor II deficiency    ??? Calculus of kidney    ??? Depression    ??? Iron deficiency    ??? Migraines    ??? Pulmonary embolism Multiple episodes     x 16 events - recent hospitalizations 3/14        Past Surgical History   Procedure Laterality Date   ??? Cystoscopy  ???     x 13   ??? Lithotripsy  2006   ??? Hx vascular access  July, 2010     Power Port placed in [place], [place]., removed   ??? Hx vascular access Right 2013     pt has current rt subclavian port - placed at [facility]   ??? Hx appendectomy  ???   ??? Hx tonsillectomy     ??? Hx cesarean section     ??? Hx tubal ligation           Family History   Problem Relation Age of Onset   ??? Colon Cancer Father 48     Passed away due to ca   ??? Bleeding Prob Father      Factor V   ??? Cancer Maternal Grandmother      metastatic breast CA.   ??? Ovarian Cancer Mother 32     passed away due to ca    ??? Bipolar Disorder Mother    ??? Kidney Disease Brother      kidney stones        History     Social History   ??? Marital Status: DIVORCED     Spouse Name: N/A     Number of Children: N/A   ??? Years of Education: N/A     Occupational History   ??? Not on file.     Social History Main Topics   ??? Smoking status: Never Smoker    ??? Smokeless tobacco: Never Used   ??? Alcohol Use: Yes      Comment: rarely   ??? Drug Use: No      Comment: narcotic seeking per hospital hx.   ??? Sexual Activity:     Partners: Male     Birth Control/ Protection: Surgical     Other Topics Concern   ??? Not on file     Social History Narrative  ALLERGIES: Ativan; Codeine; Iodinated contrast media - iv dye; Ketorolac tromethamine; Motrin; Pcn; Pneumococcal vaccine; Pneumovax 23; and Shellfish containing products      Review of Systems   Constitutional: Negative for fever.   Cardiovascular: Negative for chest pain.   Gastrointestinal: Positive for nausea, vomiting and abdominal pain. Negative for diarrhea, constipation, melena, hematochezia, anorexia and flatus.   Genitourinary: Negative for dysuria, frequency and hematuria.   Musculoskeletal: Negative for myalgias, back pain and arthralgias.   Neurological: Negative for headaches.   All other systems reviewed and are negative.      Filed Vitals:    04/14/13 1043   Pulse: 118   Temp: 98 ??F (36.7 ??C)   Resp: 18   Height: 5\' 4"  (1.626 m)   Weight: 68.04 kg (150 lb)   SpO2: 97%            Physical Exam   Constitutional: She is oriented to person, place, and time. She appears well-developed and well-nourished. No distress.   HENT:   Head: Normocephalic and atraumatic.   Mouth/Throat: No oropharyngeal exudate.   Eyes: Conjunctivae and EOM are normal. Pupils are equal, round, and reactive to light.   Neck: Normal range of motion. Neck supple.   Cardiovascular:   Tachycardic     Pulmonary/Chest: Effort normal.   Abdominal: Bowel sounds are normal. She exhibits no distension and no mass. There is  tenderness. There is no rebound and no guarding.   Epigastric tenderness     Musculoskeletal: Normal range of motion. She exhibits no edema or tenderness.   Neurological: She is alert and oriented to person, place, and time.   Skin: Skin is warm and dry.   Psychiatric: She has a normal mood and affect. Her behavior is normal.   Nursing note and vitals reviewed.       MDM  Number of Diagnoses or Management Options     Amount and/or Complexity of Data Reviewed  Clinical lab tests: ordered and reviewed  Tests in the radiology section of CPT??: ordered and reviewed  Review and summarize past medical records: yes  Independent visualization of images, tracings, or specimens: yes    Risk of Complications, Morbidity, and/or Mortality  Presenting problems: moderate  Diagnostic procedures: moderate  Management options: moderate    Patient Progress  Patient progress: stable      Procedures

## 2013-04-14 NOTE — ED Notes (Signed)
Pt resting, resp equal and unlabored. IVF's infusing.

## 2013-04-26 ENCOUNTER — Inpatient Hospital Stay
Admit: 2013-04-26 | Discharge: 2013-05-03 | Disposition: A | Discharge status: Home / Self Care | Payer: MEDICARE | Attending: Internal Medicine | Admitting: Internal Medicine

## 2013-04-26 DIAGNOSIS — K254 Chronic or unspecified gastric ulcer with hemorrhage: Secondary | ICD-10-CM

## 2013-04-26 LAB — METABOLIC PANEL, BASIC
Anion gap: 7 mmol/L (ref 7–16)
BUN: 11 MG/DL (ref 6–23)
CO2: 28 mmol/L (ref 21–32)
Calcium: 8.4 MG/DL (ref 8.3–10.4)
Chloride: 103 mmol/L (ref 98–107)
Creatinine: 0.7 MG/DL (ref 0.6–1.0)
GFR est AA: >60 mL/min/{1.73_m2} (ref >60)
GFR est non-AA: >60 mL/min/{1.73_m2} (ref >60)
Glucose: 94 mg/dL (ref 65–100)
Potassium: 4.6 mmol/L (ref 3.5–5.1)
Sodium: 138 mmol/L (ref 136–145)

## 2013-04-26 LAB — CBC WITH AUTOMATED DIFF
ABS. BASOPHILS: 0 10*3/uL (ref 0.0–0.2)
ABS. EOSINOPHILS: 0.5 10*3/uL (ref 0.0–0.8)
ABS. IMM. GRANS.: 0 10*3/uL (ref 0.0–0.5)
ABS. LYMPHOCYTES: 1.6 10*3/uL (ref 0.5–4.6)
ABS. MONOCYTES: 0.5 10*3/uL (ref 0.1–1.3)
ABS. NEUTROPHILS: 3.7 10*3/uL (ref 1.7–8.2)
BASOPHILS: 0 % (ref 0.0–2.0)
EOSINOPHILS: 8 % — ABNORMAL HIGH (ref 0.5–7.8)
HCT: 28.8 % — ABNORMAL LOW (ref 35.8–46.3)
HGB: 9.6 g/dL — ABNORMAL LOW (ref 11.7–15.4)
IMMATURE GRANULOCYTES: 0.6 % (ref 0.0–5.0)
LYMPHOCYTES: 25 % (ref 13–44)
MCH: 30.8 PG (ref 26.1–32.9)
MCHC: 33.3 g/dL (ref 31.4–35.0)
MCV: 92.3 FL (ref 79.6–97.8)
MONOCYTES: 8 % (ref 4.0–12.0)
MPV: 9.7 FL — ABNORMAL LOW (ref 10.8–14.1)
NEUTROPHILS: 58 % (ref 43–78)
PLATELET: 349 10*3/uL (ref 150–450)
RBC: 3.12 M/uL — ABNORMAL LOW (ref 4.05–5.25)
RDW: 13.3 % (ref 11.9–14.6)
WBC: 6.5 10*3/uL (ref 4.3–11.1)

## 2013-04-26 LAB — PROTHROMBIN TIME + INR
INR: NOT DETECTED (ref 0.9–1.2)
Prothrombin time: NOT DETECTED s (ref 9.6–12.0)

## 2013-04-26 LAB — BLOOD TYPE, (ABO+RH)
ABO/Rh(D): A POS
ABO/Rh: A POS

## 2013-04-26 MED ORDER — HYDROMORPHONE (PF) 1 MG/ML IJ SOLN
1 mg/mL | INTRAMUSCULAR | Status: AC
Start: 2013-04-26 — End: 2013-04-26
  Administered 2013-04-26: 17:00:00 via INTRAVENOUS

## 2013-04-26 MED ORDER — METOCLOPRAMIDE 5 MG/ML IJ SOLN
5 mg/mL | INTRAMUSCULAR | Status: AC
Start: 2013-04-26 — End: 2013-04-26
  Administered 2013-04-26: 17:00:00 via INTRAVENOUS

## 2013-04-26 MED ORDER — SODIUM CHLORIDE 0.9 % IJ SYRG
Freq: Three times a day (TID) | INTRAMUSCULAR | Status: DC
Start: 2013-04-26 — End: 2013-04-29
  Administered 2013-04-26 – 2013-04-28 (×7): via INTRAVENOUS

## 2013-04-26 MED ORDER — SODIUM CHLORIDE 0.9 % IJ SYRG
INTRAMUSCULAR | Status: DC | PRN
Start: 2013-04-26 — End: 2013-04-29

## 2013-04-26 MED ORDER — SODIUM CHLORIDE 0.9% BOLUS IV
0.9 % | Freq: Once | INTRAVENOUS | Status: AC
Start: 2013-04-26 — End: 2013-04-26
  Administered 2013-04-26: 16:00:00 via INTRAVENOUS

## 2013-04-26 MED ORDER — ONDANSETRON (PF) 4 MG/2 ML INJECTION
4 mg/2 mL | INTRAMUSCULAR | Status: AC
Start: 2013-04-26 — End: 2013-04-26
  Administered 2013-04-26: 20:00:00 via INTRAVENOUS

## 2013-04-26 MED ORDER — HYDROMORPHONE (PF) 1 MG/ML IJ SOLN
1 mg/mL | INTRAMUSCULAR | Status: AC
Start: 2013-04-26 — End: 2013-04-26
  Administered 2013-04-26: 20:00:00 via INTRAVENOUS

## 2013-04-26 MED ORDER — PHYTONADIONE 5 MG TAB
5 mg | ORAL | Status: AC
Start: 2013-04-26 — End: 2013-04-26
  Administered 2013-04-26: 18:00:00 via ORAL

## 2013-04-26 MED ADMIN — dextrose 5% and 0.9% NaCl infusion: INTRAVENOUS | @ 23:00:00 | NDC 00409794109

## 2013-04-26 MED ADMIN — SUMAtriptan (IMITREX) tablet 100 mg: ORAL | @ 22:00:00 | NDC 63304009811

## 2013-04-26 MED ADMIN — HYDROmorphone (PF) (DILAUDID) injection 1 mg: INTRAVENOUS | @ 23:00:00 | NDC 00409255201

## 2013-04-26 MED ADMIN — clonazePAM (KlonoPIN) tablet 1 mg: ORAL | @ 22:00:00 | NDC 00904610261

## 2013-04-26 MED FILL — METOCLOPRAMIDE 5 MG/ML IJ SOLN: 5 mg/mL | INTRAMUSCULAR | Qty: 2

## 2013-04-26 MED FILL — SUMATRIPTAN 50 MG TAB: 50 mg | ORAL | Qty: 2

## 2013-04-26 MED FILL — MEPHYTON 5 MG TABLET: 5 mg | ORAL | Qty: 1

## 2013-04-26 MED FILL — ONDANSETRON (PF) 4 MG/2 ML INJECTION: 4 mg/2 mL | INTRAMUSCULAR | Qty: 2

## 2013-04-26 MED FILL — SODIUM CHLORIDE 0.9 % IV: INTRAVENOUS | Qty: 1000

## 2013-04-26 MED FILL — HYDROMORPHONE (PF) 1 MG/ML IJ SOLN: 1 mg/mL | INTRAMUSCULAR | Qty: 1

## 2013-04-26 MED FILL — CLONAZEPAM 1 MG TAB: 1 mg | ORAL | Qty: 1

## 2013-04-26 MED FILL — DEXTROSE 5% IN NORMAL SALINE IV: INTRAVENOUS | Qty: 1000

## 2013-04-26 NOTE — ED Notes (Signed)
Pt has removed her bp cuff a total of 5 times

## 2013-04-26 NOTE — H&P (Addendum)
History and Physical    Subjective:     Krista Lopez is a 48 y.o. 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 She reports a HA located behind her L eye which she describes feeling like a "storm and thundering." Sx started 2 days ago with sx coming on all of a sudden. The pain is at a severity of 8/10 and is associated with N/V and is worsened by lights. She reports taking her last Imitrex tab last night and she took an Oxycodone 5 mg tab with no relief. She also reported having nosebleeds. This morning she reported having 1 episode of dark stool. She reports mild R lower abd cramping. She had 2 episodes of vomiting today and noticed some blood in the vomit which she describes as "pink." She also reports her urine being pink. She reports having an EGD and colonoscopy in 2005-2006.    PCP: Dr. Quentin Angst; Hematology: Dr. Anner Crete      Past Medical History   Diagnosis Date   ??? Factor V Leiden mutation    ??? Infectious disease 02/2009      Hx MRSA / E.Coli bacteremia, MRSA wound infection 2/2 port   ??? Factor II deficiency    ??? Calculus of kidney    ??? Depression    ??? Iron deficiency    ??? Migraines    ??? Pulmonary embolism Multiple episodes     x 16 events - recent hospitalizations 3/14   ??? Bipolar disorder    ??? Anxiety    ??? Restless leg syndrome       Past Surgical History   Procedure Laterality Date   ??? Cystoscopy  ???     x 13   ??? Lithotripsy  2006   ??? Hx vascular access  July, 2010     Power Port placed in [place], [place]., removed   ??? Hx vascular access Right 2013     pt has current rt subclavian port - placed at [facility]   ??? Hx appendectomy  ???   ??? Hx tonsillectomy     ??? Hx cesarean section     ??? Hx tubal ligation     ??? Hx ovarian cyst removal       on R     Family History    Problem Relation Age of Onset   ??? Bleeding Prob Father      Factor V   ??? Cancer Maternal Grandmother      metastatic breast CA.   ??? Ovarian Cancer Mother 88     passed away due to ca   ??? Bipolar Disorder Mother    ??? Kidney Disease Brother      kidney stones      History   Substance Use Topics   ??? Smoking status: Never Smoker    ??? Smokeless tobacco: Never Used   ??? Alcohol Use: Yes      Comment: rarely - only drinks 2 times per year       Prior to Admission medications    Medication Sig Start Date End Date Taking? Authorizing Provider   warfarin (COUMADIN) 5 mg tablet Take 5 mg by mouth nightly.   Yes Historical Provider   ondansetron (ZOFRAN ODT) 4 mg disintegrating tablet Take 1 tablet by mouth every eight (8) hours as needed for  Nausea. 03/09/13   Samella Parr, MD   QUEtiapine (SEROQUEL) 300 mg tablet TAKE 1 TABLET BY MOUTH EVERY NIGHT 02/07/13   Lopa S Bhansaly, DO   SUMAtriptan (IMITREX) 100 mg tablet Take 100 mg by mouth once as needed for Migraine.    Historical Provider   duloxetine (CYMBALTA) 30 mg capsule Take 3 capsules by mouth daily. Indications: Bipolar Disorder 12/06/12   Lopa S Bhansaly, DO   KLONOPIN 1 mg Tab Take 1 mg by mouth three (3) times daily. Indications: Anxiety disorder    Phys Other, MD     Allergies   Allergen Reactions   ??? Ativan [Lorazepam] Itching   ??? Codeine Rash   ??? Iodinated Contrast Media - Iv Dye Rash     States also has swelling, and allergic to shellfish   ??? Ketorolac Tromethamine Rash   ??? Motrin [Ibuprofen] Other (comments)     Causes stomach upset after 3 doses.   ??? Pcn [Penicillins] Hives   ??? Pneumococcal Vaccine Swelling   ??? Pneumovax 23 [Pneumococcal 23-Val Ps Vaccine] Other (comments)     Local reaction   ??? Shellfish Containing Products Rash        Review of Systems:  A comprehensive review of systems was negative except for that written in the History of Present Illness.     Objective:     Intake and Output:            Physical Exam:   BP 130/64    Pulse 98    Temp(Src)  98 ??F (36.7 ??C)    Resp 20    Ht 5\' 4"  (1.626 m)    Wt 63.05 kg (139 lb)    BMI 23.85 kg/m2      SpO2 98%    LMP 07/28/2004     General appearance: alert, cooperative, no distress, appears stated age  Eyes: conjunctivae/corneas clear. PERRL, EOM's intact. No scleral icterus  Nose: Dried blood noted L nare.  Throat: Lips, mucosa, and tongue normal. Teeth and gums normal.  Lungs: clear to auscultation bilaterally  Heart: regular rate and rhythm, S1, S2 normal, no murmur, click, rub or gallop  Abdomen: soft, non-tender. Bowel sounds normal. No masses,  no organomegaly  Extremities: extremities normal, atraumatic, no cyanosis or edema  Pulses: 2+ and symmetric  Skin: Skin color, texture, turgor normal. No rashes or lesions  Neurologic: Grossly normal  Psychiatric: Normal mood and affect.      Data Review:   Recent Results (from the past 24 hour(s))   PROTHROMBIN TIME + INR    Collection Time     04/26/13 12:36 PM       Result Value Ref Range    Prothrombin time NO CLOT DETECTED  9.6 - 12.0 sec    INR NO CLOT DETECTED  0.9 - 1.2     CBC WITH AUTOMATED DIFF    Collection Time     04/26/13 12:36 PM       Result Value Ref Range    WBC 6.5  4.3 - 11.1 K/uL    RBC 3.12 (*) 4.05 - 5.25 M/uL    HGB 9.6 (*) 11.7 - 15.4 g/dL    HCT 45.4 (*) 09.8 - 46.3 %    MCV 92.3  79.6 - 97.8 FL    MCH 30.8  26.1 - 32.9 PG    MCHC 33.3  31.4 - 35.0 g/dL    RDW 11.9  14.7 - 82.9 %    PLATELET  349  150 - 450 K/uL    MPV 9.7 (*) 10.8 - 14.1 FL    DF AUTOMATED      NEUTROPHILS 58  43 - 78 %    LYMPHOCYTES 25  13 - 44 %    MONOCYTES 8  4.0 - 12.0 %    EOSINOPHILS 8 (*) 0.5 - 7.8 %    BASOPHILS 0  0.0 - 2.0 %    IMMATURE GRANULOCYTES 0.6  0.0 - 5.0 %    ABS. NEUTROPHILS 3.7  1.7 - 8.2 K/UL    ABS. LYMPHOCYTES 1.6  0.5 - 4.6 K/UL    ABS. MONOCYTES 0.5  0.1 - 1.3 K/UL    ABS. EOSINOPHILS 0.5  0.0 - 0.8 K/UL    ABS. BASOPHILS 0.0  0.0 - 0.2 K/UL    ABS. IMM. GRANS. 0.0  0.0 - 0.5 K/UL   METABOLIC PANEL, BASIC    Collection Time     04/26/13 12:36 PM        Result Value Ref Range    Sodium 138  136 - 145 mmol/L    Potassium 4.6  3.5 - 5.1 mmol/L    Chloride 103  98 - 107 mmol/L    CO2 28  21 - 32 mmol/L    Anion gap 7  7 - 16 mmol/L    Glucose 94  65 - 100 mg/dL    BUN 11  6 - 23 MG/DL    Creatinine 7.82  0.6 - 1.0 MG/DL    GFR est AA >95  >62 ml/min/1.23m2    GFR est non-AA >60  >60 ml/min/1.13m2    Calcium 8.4  8.3 - 10.4 MG/DL   PROTHROMBIN TIME + INR    Collection Time     04/26/13  2:07 PM       Result Value Ref Range    Prothrombin time NO CLOT DETECTED  9.6 - 12.0 sec    INR NO CLOT DETECTED  0.9 - 1.2         CT head noncontrast 3/17:  FINDINGS: The brain appears normal. Exam is negative for acute hemorrhage,  mass, or mass effect. No definite CT evidence of acute major vascular territory  infarct.  Ventricles normal size and contour. No abnormal intracranial fluid collections.  Bony calvarium intact. Mastoid air cells and paranasal sinuses clear where  visualized.  IMPRESSION: Negative noncontrast CT brain.    Assessment:     Principal Problem:    Coagulopathy (04/26/2013)    Active Problems:    Migraine (04/26/2013)      Pulmonary embolism, Recurrent (04/26/2013)      Depression (04/26/2013)      Anxiety (04/26/2013)      Bipolar 2 disorder (04/26/2013)      Iron deficiency anemia (04/26/2013)      Factor V Leiden mutation (04/26/2013)      Factor II deficiency (04/26/2013)      Epistaxis (04/26/2013)      Melena (04/26/2013)      Acute blood loss anemia (04/26/2013)        Plan:   - Coagulopathy: Pt with undetectable INR on presentation. Given dose of Vit K 5 mg PO x 1 in the ER. Will hold her coumadin and order 2 units FFP. Will monitor daily INR.  - Migraine: Chronic. Her chart indicates history of narcotic seeking. Will treat with prn analgesics. Will also give dose of Imitrex.  - Recurrent PE/Factor V Leiden mutation/Factor II deficiency: Pt on  chronic coumadin and is managed by Dr. Anner CreteWells. Pt reports that her goal INR is 3.0-3.5.   - Depression/Anxiety/Bipolar  disorder: Chronic. Will continue her usual outpt regimen of Seroquel, Cymbalta and Klonopin.  - Fe deficiency anemia: Will check iron studies. Will monitor serial Hgb/Hct levels.  - Epistaxis: Currently this has resolved. Will monitor.  - Melena: Likely due to her supratherapeutic INR. Will treat with IV Protonix. Will consult GI for additional recs.  - ABLA: Her Hgb was 12.1 on 04/14/13 and now has dropped to 9.6. Will monitor serial Hgb/Hct levels and transfuse prn.  - DVT prophylaxis: Will use SCDs.  - Code status: Full code.      Signed By: Darien RamusAlexandra M Gail Creekmore, MD     April 26, 2013

## 2013-04-26 NOTE — Progress Notes (Signed)
Mews score of 3 noted. Patient pulse 118 following ambulation to bathroom and complaint of pain rated 7/10 to left "behind the eye". Medicated with Dilaudid 1 mg SIVP. Primary RN Kayce aware and will monitor pulse.

## 2013-04-26 NOTE — Progress Notes (Signed)
Assessment completed via doc flowsheet, pt alert and oriented, respirations present, even and unlabored, HOB elevated, pt denies any SOB at this time, S1&S2 auscultated, HR regular, abd soft, non-tender, bowel sounds present in +4 quadrants, Right port in place, last drsing change 04/26/13, port accessed today as well, infusing without difficulty, SCDs in place and functioning, pt denies any N\\V at this time, scabs noted to BLE, cotton in nares, pt states having a nose bleed for x2 days, pt states last BM 04/26/13, pt denies any pain at this time, pt instructed to call for assistance, pt verbalizes understanding, bed low and locked, side rails x2, call light within reach.

## 2013-04-26 NOTE — Progress Notes (Signed)
Placed call to Dr. Joylene DraftNatajaran informing him of pts critical lab results, new orders received for 2 units of FFPs, 2.5mg  of Vitamin K orally, CBC w/diff for am, and PT/INR in the am.

## 2013-04-26 NOTE — ED Provider Notes (Signed)
HPI Comments: Usual type migraine HA and intermittent nosebleed for past couple of days, black stool this AM.    Patient is a 48 y.o. female presenting with migraines. [name]   Migraine   This is a recurrent problem. The current episode started 2 days ago. The problem occurs constantly. The problem has not changed since onset.The headache is aggravated by nothing. The pain is located in the left unilateral, frontal and temporal region. The quality of the pain is described as throbbing. The pain is at a severity of 8/10. Associated symptoms include nausea and vomiting. Pertinent negatives include no anorexia, no fever, no malaise/fatigue, no chest pressure, no near-syncope, no orthopnea, no palpitations, no syncope, no shortness of breath, no weakness, no tingling, no dizziness and no visual change. She has tried oral narcotic analgesics for the symptoms. The treatment provided no relief.        Past Medical History   Diagnosis Date   ??? Factor V Leiden mutation    ??? Infectious disease 02/2009      Hx MRSA / E.Coli bacteremia, MRSA wound infection 2/2 port   ??? Factor II deficiency    ??? Calculus of kidney    ??? Depression    ??? Iron deficiency    ??? Migraines    ??? Pulmonary embolism Multiple episodes     x 16 events - recent hospitalizations 3/14        Past Surgical History   Procedure Laterality Date   ??? Cystoscopy  ???     x 13   ??? Lithotripsy  2006   ??? Hx vascular access  July, 2010     Power Port placed in [place], [place]., removed   ??? Hx vascular access Right 2013     pt has current rt subclavian port - placed at [facility]   ??? Hx appendectomy  ???   ??? Hx tonsillectomy     ??? Hx cesarean section     ??? Hx tubal ligation           Family History   Problem Relation Age of Onset   ??? Colon Cancer Father 33     Passed away due to ca   ??? Bleeding Prob Father      Factor V   ??? Cancer Maternal Grandmother      metastatic breast CA.   ??? Ovarian Cancer Mother 25     passed away due to ca    ??? Bipolar Disorder Mother    ??? Kidney Disease Brother      kidney stones        History     Social History   ??? Marital Status: DIVORCED     Spouse Name: N/A     Number of Children: N/A   ??? Years of Education: N/A     Occupational History   ??? Not on file.     Social History Main Topics   ??? Smoking status: Never Smoker    ??? Smokeless tobacco: Never Used   ??? Alcohol Use: Yes      Comment: rarely   ??? Drug Use: No      Comment: narcotic seeking per hospital hx.   ??? Sexual Activity:     Partners: Male     Birth Control/ Protection: Surgical     Other Topics Concern   ??? Not on file     Social History Narrative  ALLERGIES: Ativan; Codeine; Iodinated contrast media - iv dye; Ketorolac tromethamine; Motrin; Pcn; Pneumococcal vaccine; Pneumovax 23; and Shellfish containing products      Review of Systems   Constitutional: Negative for fever, chills, malaise/fatigue, diaphoresis and fatigue.   Respiratory: Negative for shortness of breath.    Cardiovascular: Negative for palpitations, orthopnea, syncope and near-syncope.   Gastrointestinal: Positive for nausea and vomiting. Negative for abdominal pain, diarrhea, constipation and anorexia.        See HPI     Neurological: Positive for headaches. Negative for dizziness, tingling, weakness and light-headedness.   All other systems reviewed and are negative.      Filed Vitals:    04/26/13 1101   BP: 120/87   Pulse: 98   Temp: 98 ??F (36.7 ??C)   Resp: 22   Height: 5\' 4"  (1.626 m)   Weight: 63.05 kg (139 lb)            Physical Exam   Constitutional: She is oriented to person, place, and time. She appears well-developed and well-nourished. She appears distressed (mild).   HENT:   Head: Normocephalic and atraumatic.   Right Ear: Tympanic membrane and external ear normal.   Left Ear: Tympanic membrane and external ear normal.   Nose: No sinus tenderness or nasal deformity. Epistaxis (mild on left) is observed.   Mouth/Throat: Oropharynx is clear and moist.   Eyes:  Conjunctivae and EOM are normal. Pupils are equal, round, and reactive to light.   Neck: Normal range of motion. Neck supple. No tracheal deviation present.   Cardiovascular: Normal rate, regular rhythm, normal heart sounds and intact distal pulses.  Exam reveals no gallop and no friction rub.    No murmur heard.  Pulmonary/Chest: Effort normal and breath sounds normal. No respiratory distress. She has no wheezes.   Abdominal: Soft. Bowel sounds are normal. She exhibits no distension and no mass. There is no hepatosplenomegaly. There is no tenderness. There is no rebound and no guarding.   Musculoskeletal: Normal range of motion. She exhibits no edema.   Lymphadenopathy:     She has no cervical adenopathy.   Neurological: She is alert and oriented to person, place, and time. She has normal strength. She displays normal reflexes. No cranial nerve deficit or sensory deficit. Coordination normal.   Skin: Skin is warm and dry. No rash noted. She is not diaphoretic. No erythema.   Psychiatric: She has a normal mood and affect. Her speech is normal.   Nursing note and vitals reviewed.       MDM  Number of Diagnoses or Management Options  Anticoagulant overdosage, initial encounter: new and requires workup  Epistaxis: new and requires workup  Migraine headache: established and worsening     Amount and/or Complexity of Data Reviewed  Clinical lab tests: ordered and reviewed  Tests in the radiology section of CPT??: ordered and reviewed  Review and summarize past medical records: yes  Discuss the patient with other providers: yes  Independent visualization of images, tracings, or specimens: yes    Risk of Complications, Morbidity, and/or Mortality  Presenting problems: high  Management options: high    Patient Progress  Patient progress: stable      Procedures    The patient was observed in the ED.    Results Reviewed:      Recent Results (from the past 24 hour(s))   PROTHROMBIN TIME + INR    Collection Time     04/26/13  12:36 PM  Result Value Ref Range    Prothrombin time NO CLOT DETECTED  9.6 - 12.0 sec    INR NO CLOT DETECTED  0.9 - 1.2     CBC WITH AUTOMATED DIFF    Collection Time     04/26/13 12:36 PM       Result Value Ref Range    WBC 6.5  4.3 - 11.1 K/uL    RBC 3.12 (*) 4.05 - 5.25 M/uL    HGB 9.6 (*) 11.7 - 15.4 g/dL    HCT 96.2 (*) 95.2 - 46.3 %    MCV 92.3  79.6 - 97.8 FL    MCH 30.8  26.1 - 32.9 PG    MCHC 33.3  31.4 - 35.0 g/dL    RDW 84.1  32.4 - 40.1 %    PLATELET 349  150 - 450 K/uL    MPV 9.7 (*) 10.8 - 14.1 FL    DF AUTOMATED      NEUTROPHILS 58  43 - 78 %    LYMPHOCYTES 25  13 - 44 %    MONOCYTES 8  4.0 - 12.0 %    EOSINOPHILS 8 (*) 0.5 - 7.8 %    BASOPHILS 0  0.0 - 2.0 %    IMMATURE GRANULOCYTES 0.6  0.0 - 5.0 %    ABS. NEUTROPHILS 3.7  1.7 - 8.2 K/UL    ABS. LYMPHOCYTES 1.6  0.5 - 4.6 K/UL    ABS. MONOCYTES 0.5  0.1 - 1.3 K/UL    ABS. EOSINOPHILS 0.5  0.0 - 0.8 K/UL    ABS. BASOPHILS 0.0  0.0 - 0.2 K/UL    ABS. IMM. GRANS. 0.0  0.0 - 0.5 K/UL   METABOLIC PANEL, BASIC    Collection Time     04/26/13 12:36 PM       Result Value Ref Range    Sodium 138  136 - 145 mmol/L    Potassium 4.6  3.5 - 5.1 mmol/L    Chloride 103  98 - 107 mmol/L    CO2 28  21 - 32 mmol/L    Anion gap 7  7 - 16 mmol/L    Glucose 94  65 - 100 mg/dL    BUN 11  6 - 23 MG/DL    Creatinine 0.27  0.6 - 1.0 MG/DL    GFR est AA >25  >36 ml/min/1.47m2    GFR est non-AA >60  >60 ml/min/1.14m2    Calcium 8.4  8.3 - 10.4 MG/DL   PROTHROMBIN TIME + INR    Collection Time     04/26/13  2:07 PM       Result Value Ref Range    Prothrombin time NO CLOT DETECTED  9.6 - 12.0 sec    INR NO CLOT DETECTED  0.9 - 1.2

## 2013-04-26 NOTE — ED Notes (Signed)
It took 3 attempt to obtain port access lg hematoma to site from what pt relates was an acess at another facility several days ago

## 2013-04-26 NOTE — Progress Notes (Signed)
Received patient from ER with accessed chest port.  Ambulating in room . Oriented to room. Head of bed elevated. Room air noted. Denies any pain alert and oriented times 4. Small scabs to front of bilateral legs. Abdomen soft. resp even and unlabored. Instructed to call for assistance. Left nare with cotton in place.  No visible signs of bleeding at this time. Will continue to monitor.

## 2013-04-26 NOTE — Progress Notes (Signed)
Krista BrownsAnthony from lab called with critical lab result of pts PT/INR, PT greater than 100, INR greater than 13.5, lab tech states [facility] had [facility] be [facility] due to our machines not being able to read the results that high, states that labs were drawn around 1700. Placed call to Dr. Joylene DraftNatajaran to inform him of the results.

## 2013-04-26 NOTE — Progress Notes (Signed)
TRANSFER - IN REPORT:    Verbal report received from Mayo AoMichael Jones, RN, (name) [facility] received from ER(unit) for routine progression of care      Report consisted of patient???s Situation, Background, Assessment and   Recommendations(SBAR).     Information from the following report(s) SBAR was reviewed with the receiving nurse.    Opportunity for questions and clarification was provided.      Assessment completed upon patient???s arrival to unit and care assumed.

## 2013-04-26 NOTE — ED Notes (Signed)
TRANSFER - OUT REPORT:    Verbal report given to Neela, RN on [facility]annon Trent  being transferred to 340 for routine progression of care       Report consisted of patient???s Situation, Background, Assessment and   Recommendations(SBAR).     Information from the following report(s) SBAR, ED Summary, Intake/Output, MAR and Recent Results was reviewed with the receiving nurse.    Opportunity for questions and clarification was provided.

## 2013-04-27 LAB — CBC WITH AUTOMATED DIFF
ABS. BASOPHILS: 0 10*3/uL (ref 0.0–0.2)
ABS. EOSINOPHILS: 0.4 10*3/uL (ref 0.0–0.8)
ABS. IMM. GRANS.: 0 10*3/uL (ref 0.0–0.5)
ABS. LYMPHOCYTES: 1.9 10*3/uL (ref 0.5–4.6)
ABS. MONOCYTES: 0.5 10*3/uL (ref 0.1–1.3)
ABS. NEUTROPHILS: 2.1 10*3/uL (ref 1.7–8.2)
BASOPHILS: 0 % (ref 0.0–2.0)
EOSINOPHILS: 8 % — ABNORMAL HIGH (ref 0.5–7.8)
HCT: 22.5 % — ABNORMAL LOW (ref 35.8–46.3)
HGB: 7.3 g/dL — ABNORMAL LOW (ref 11.7–15.4)
IMMATURE GRANULOCYTES: 0.2 % (ref 0.0–5.0)
LYMPHOCYTES: 38 % (ref 13–44)
MCH: 30.3 PG (ref 26.1–32.9)
MCHC: 32.4 g/dL (ref 31.4–35.0)
MCV: 93.4 FL (ref 79.6–97.8)
MONOCYTES: 11 % (ref 4.0–12.0)
MPV: 9.6 FL — ABNORMAL LOW (ref 10.8–14.1)
NEUTROPHILS: 43 % (ref 43–78)
PLATELET: 275 10*3/uL (ref 150–450)
RBC: 2.41 M/uL — ABNORMAL LOW (ref 4.05–5.25)
RDW: 13.6 % (ref 11.9–14.6)
WBC: 5 10*3/uL (ref 4.3–11.1)

## 2013-04-27 LAB — METABOLIC PANEL, BASIC
Anion gap: 6 mmol/L — ABNORMAL LOW (ref 7–16)
BUN: 8 MG/DL (ref 6–23)
CO2: 30 mmol/L (ref 21–32)
Calcium: 7.8 MG/DL — ABNORMAL LOW (ref 8.3–10.4)
Chloride: 107 mmol/L (ref 98–107)
Creatinine: 0.8 MG/DL (ref 0.6–1.0)
GFR est AA: >60 mL/min/{1.73_m2} (ref >60)
GFR est non-AA: >60 mL/min/{1.73_m2} (ref >60)
Glucose: 94 mg/dL (ref 65–100)
Potassium: 3.9 mmol/L (ref 3.5–5.1)
Sodium: 143 mmol/L (ref 136–145)

## 2013-04-27 LAB — TRANSFERRIN SATURATION
Iron: 49 ug/dL (ref 35–150)
TIBC: 324 ug/dL (ref 250–450)
Transferrin Saturation: 15 % — ABNORMAL LOW (ref >20)

## 2013-04-27 LAB — PLASMA, ALLOCATE
Status of unit: TRANSFUSED
Status of unit: TRANSFUSED
Unit division: 0
Unit division: 0

## 2013-04-27 LAB — HGB & HCT
HCT: 23.7 % — ABNORMAL LOW (ref 35.8–46.3)
HCT: 23 % — ABNORMAL LOW (ref 35.8–46.3)
HGB: 7.8 g/dL — ABNORMAL LOW (ref 11.7–15.4)
HGB: 7.5 g/dL — ABNORMAL LOW (ref 11.7–15.4)

## 2013-04-27 LAB — PROTHROMBIN TIME + INR
INR: >6.8 — CR (ref 0.9–1.2)
INR: 2.4 — ABNORMAL HIGH (ref 0.9–1.2)
Prothrombin time: >100 s — ABNORMAL HIGH (ref 8.6–12.2)
Prothrombin time: 25.8 s — ABNORMAL HIGH (ref 8.6–12.2)

## 2013-04-27 LAB — MRSA SCREEN - PCR (NASAL)

## 2013-04-27 LAB — FERRITIN: Ferritin: 66 NG/ML (ref 8–388)

## 2013-04-27 LAB — TRANSFERRIN: Transferrin: 293 mg/dL (ref 202–364)

## 2013-04-27 MED ADMIN — pantoprazole (PROTONIX) 40 mg in sodium chloride 0.9 % 10 mL injection: INTRAVENOUS | @ 12:00:00 | NDC 00409488810

## 2013-04-27 MED ADMIN — clonazePAM (KlonoPIN) tablet 1 mg: ORAL | @ 02:00:00 | NDC 00904610261

## 2013-04-27 MED ADMIN — HYDROmorphone (PF) (DILAUDID) injection 1 mg: INTRAVENOUS | @ 21:00:00 | NDC 00409255201

## 2013-04-27 MED ADMIN — clonazePAM (KlonoPIN) tablet 1 mg: ORAL | @ 20:00:00 | NDC 00904610261

## 2013-04-27 MED ADMIN — lip protectant (BLISTEX) ointment: TOPICAL | @ 12:00:00 | NDC 04138821021

## 2013-04-27 MED ADMIN — phytonadione (MEPHYTON) tablet 2.5 mg: ORAL | @ 03:00:00 | NDC 00187170405

## 2013-04-27 MED ADMIN — DULoxetine (CYMBALTA) capsule 90 mg: ORAL | @ 12:00:00 | NDC 68001025704

## 2013-04-27 MED ADMIN — HYDROmorphone (PF) (DILAUDID) injection 1 mg: INTRAVENOUS | @ 03:00:00 | NDC 00409255201

## 2013-04-27 MED ADMIN — pantoprazole (PROTONIX) 40 mg in sodium chloride 0.9 % 10 mL injection: INTRAVENOUS | @ 02:00:00 | NDC 00409488810

## 2013-04-27 MED ADMIN — ondansetron (ZOFRAN) injection 4 mg: INTRAVENOUS | @ 07:00:00 | NDC 00781301072

## 2013-04-27 MED ADMIN — ondansetron (ZOFRAN) injection 4 mg: INTRAVENOUS | @ 03:00:00 | NDC 23155037831

## 2013-04-27 MED ADMIN — dextrose 5% and 0.9% NaCl infusion: INTRAVENOUS | @ 12:00:00 | NDC 00409794109

## 2013-04-27 MED ADMIN — HYDROmorphone (PF) (DILAUDID) injection 1 mg: INTRAVENOUS | @ 17:00:00 | NDC 00409255201

## 2013-04-27 MED ADMIN — HYDROmorphone (PF) (DILAUDID) injection 1 mg: INTRAVENOUS | @ 12:00:00 | NDC 00409255201

## 2013-04-27 MED ADMIN — QUEtiapine (SEROquel) tablet 300 mg: ORAL | @ 02:00:00 | NDC 68084053211

## 2013-04-27 MED ADMIN — ondansetron (ZOFRAN) injection 4 mg: INTRAVENOUS | @ 16:00:00 | NDC 23155037831

## 2013-04-27 MED ADMIN — ondansetron (ZOFRAN) injection 4 mg: INTRAVENOUS | @ 12:00:00 | NDC 23155037831

## 2013-04-27 MED ADMIN — SUMAtriptan (IMITREX) injection 6 mg: SUBCUTANEOUS | @ 22:00:00 | NDC 00781317471

## 2013-04-27 MED ADMIN — lip protectant (BLISTEX) ointment: TOPICAL | @ 07:00:00

## 2013-04-27 MED ADMIN — ondansetron (ZOFRAN) injection 4 mg: INTRAVENOUS | @ 21:00:00 | NDC 23155037831

## 2013-04-27 MED ADMIN — HYDROmorphone (PF) (DILAUDID) injection 1 mg: INTRAVENOUS | @ 07:00:00 | NDC 00409255201

## 2013-04-27 MED ADMIN — clonazePAM (KlonoPIN) tablet 1 mg: ORAL | @ 12:00:00 | NDC 00904610261

## 2013-04-27 MED FILL — LIP PROTECTANT 0.6 %-0.5 %-1 %-0.5 % OINTMENT: CUTANEOUS | Qty: 1

## 2013-04-27 MED FILL — CLONAZEPAM 1 MG TAB: 1 mg | ORAL | Qty: 1

## 2013-04-27 MED FILL — ONDANSETRON (PF) 4 MG/2 ML INJECTION: 4 mg/2 mL | INTRAMUSCULAR | Qty: 2

## 2013-04-27 MED FILL — SODIUM CHLORIDE 0.9 % INJECTION: INTRAMUSCULAR | Qty: 10

## 2013-04-27 MED FILL — HYDROMORPHONE (PF) 1 MG/ML IJ SOLN: 1 mg/mL | INTRAMUSCULAR | Qty: 1

## 2013-04-27 MED FILL — SODIUM CHLORIDE 0.9 % IV: INTRAVENOUS | Qty: 1000

## 2013-04-27 MED FILL — SUMATRIPTAN 6 MG/0.5 ML SUB-Q: 6 mg/0.5 mL | SUBCUTANEOUS | Qty: 0.5

## 2013-04-27 MED FILL — DULOXETINE 60 MG CAP, DELAYED RELEASE: 60 mg | ORAL | Qty: 1

## 2013-04-27 MED FILL — QUETIAPINE 100 MG TAB: 100 mg | ORAL | Qty: 3

## 2013-04-27 MED FILL — MEPHYTON 5 MG TABLET: 5 mg | ORAL | Qty: 1

## 2013-04-27 NOTE — Progress Notes (Signed)
Problem: Interdisciplinary Rounds  Goal: Interdisciplinary Rounds  Interdisciplinary team rounds were held 04/27/2013 with the following team members:Nutrition, Outcomes Management and Clinical Coordinator and the patient.    Plan of care discussed. See clinical pathway and/or care plan for interventions and desired outcomes.

## 2013-04-27 NOTE — Progress Notes (Signed)
Pt called for primary RN to come assess pts stool, medium amount of formed dark, black stool noted.

## 2013-04-27 NOTE — Progress Notes (Signed)
Hospitalist Progress Note    Subjective:   Daily Progress Note: 04/27/2013 2:38 PM    Krista Lopez is a 48 y.o. 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 Pt's INR in the ER was noted to be undetectable. Her Hgb was 12.1 on 04/14/13 and on presentation had dropped to 9.6. Pt admitted for further management. She was given Vit K 5 mg PO in the ER. She was given 2 units FFP on admission.    Patient seen and examined.  Nursing notes and chart reviewed.  No specific nursing issues. Pt received additional 2 units FFP overnight with Vit K 2.5 mg PO x 1. Pt wanting her diet advanced to regular food although she is complaining of R lower abd discomfort. She has not had any further BMs since admission. Pt reports persistent HA. She denies any further nose bleeds. She denies SOB, CP, N/V.    Current Facility-Administered Medications   Medication Dose Route Frequency   ??? DULoxetine (CYMBALTA) capsule 90 mg  90 mg Oral DAILY   ??? clonazePAM (KlonoPIN) tablet 1 mg  1 mg Oral TID   ??? QUEtiapine (SEROquel) tablet 300 mg  300 mg Oral QHS   ??? sodium chloride (NS) flush 5-10 mL  5-10 mL IntraVENous Q8H   ??? sodium chloride (NS) flush 5-10 mL  5-10 mL IntraVENous PRN   ??? HYDROmorphone (PF) (DILAUDID) injection 1 mg  1 mg IntraVENous Q4H PRN   ??? traMADol (ULTRAM) tablet 50 mg  50 mg Oral Q6H PRN   ??? HYDROcodone-acetaminophen (NORCO) 5-325 mg per tablet 1 Tab  1 Tab Oral Q4H PRN   ??? acetaminophen (TYLENOL) tablet 650 mg  650 mg Oral Q4H PRN   ??? metoclopramide HCl (REGLAN) injection 10 mg  10 mg IntraVENous Q4H PRN   ??? ondansetron (ZOFRAN) injection 4 mg  4 mg IntraVENous Q4H PRN   ??? naloxone (NARCAN) injection 0.4 mg  0.4 mg IntraVENous PRN   ??? 0.9% sodium chloride infusion 250 mL  250 mL  IntraVENous PRN   ??? pantoprazole (PROTONIX) 40 mg in sodium chloride 0.9 % 10 mL injection  40 mg IntraVENous Q12H   ??? dextrose 5% and 0.9% NaCl infusion  75 mL/hr IntraVENous CONTINUOUS   ??? 0.9% sodium chloride infusion 250 mL  250 mL IntraVENous PRN   ??? lip protectant (BLISTEX) ointment   Topical PRN        Review of Systems  Pertinent items are noted in HPI.    Objective:     BP 105/61    Pulse 99    Temp(Src) 98.4 ??F (36.9 ??C)    Resp 18    Ht 5\' 4"  (1.626 m)    Wt 63.05 kg (139 lb)    BMI 23.85 kg/m2      SpO2 98%    LMP 07/28/2004           Temp (24hrs), Avg:98.2 ??F (36.8 ??C), Min:96.8 ??F (36 ??C), Max:99.8 ??F (37.7 ??C)      03/18 0700 - 03/18 1859  In: 240 [P.O.:240]  Out: 500 [Urine:500]  03/16 1900 - 03/18 0659  In: 1712 [I.V.:862]  Out: 2100 [Urine:2100]    BP 105/61    Pulse 99    Temp(Src) 98.4 ??F (36.9 ??C)    Resp 18  Ht 5\' 4"  (1.626 m)    Wt 63.05 kg (139 lb)    BMI 23.85 kg/m2      SpO2 98%    LMP 07/28/2004     General:  Alert, cooperative, no distress, appears stated age.   Eyes:  Conjunctivae pale. No scleral icterus. EOMs intact.    Lungs:   Clear to auscultation bilaterally.   Heart:  Regular rate and rhythm, S1, S2 normal, no murmur, click, rub or gallop.   Abdomen:   Soft, non-tender. Bowel sounds normal. No masses,  No organomegaly.   Extremities: Extremities normal, atraumatic, no cyanosis or edema.   Pulses: 2+ and symmetric all extremities.   Skin: Skin color, texture, turgor normal. No rashes or lesions.   Neurologic: Grossly normal.       Additional comments:  I reviewed the patient's new clinical lab test results and the patient's new imaging test results.     Data Review    Recent Results (from the past 24 hour(s))   FFP, ALLOCATE    Collection Time     04/26/13  5:15 PM       Result Value Ref Range    Unit number [id number]      Blood component type FP24H,THAW      Unit division 00      Status of unit TRANSFUSED      Unit number [id number]      Blood component type FP24H,THAW       Unit division 00      Status of unit TRANSFUSED     TYPE, ABO & RH    Collection Time     04/26/13  5:45 PM       Result Value Ref Range    ABO/Rh(D) A POSITIVE     HGB & HCT    Collection Time     04/26/13 10:10 PM       Result Value Ref Range    HGB 7.8 (*) 11.7 - 15.4 g/dL    HCT 30.8 (*) 65.7 - 46.3 %   FFP, ALLOCATE    Collection Time     04/26/13 10:30 PM       Result Value Ref Range    Unit number [id number]      Blood component type FP24H,THAW      Unit division 00      Status of unit ISSUED      Unit number [id number]      Blood component type FP24H,THAW      Unit division 00      Status of unit TRANSFUSED     METABOLIC PANEL, BASIC    Collection Time     04/27/13  6:28 AM       Result Value Ref Range    Sodium 143  136 - 145 mmol/L    Potassium 3.9  3.5 - 5.1 mmol/L    Chloride 107  98 - 107 mmol/L    CO2 30  21 - 32 mmol/L    Anion gap 6 (*) 7 - 16 mmol/L    Glucose 94  65 - 100 mg/dL    BUN 8  6 - 23 MG/DL    Creatinine 4.40  0.6 - 1.0 MG/DL    GFR est AA >34  >74 ml/min/1.71m2    GFR est non-AA >60  >60 ml/min/1.91m2    Calcium 7.8 (*) 8.3 - 10.4 MG/DL   PROTHROMBIN TIME + INR    Collection Time  04/27/13  6:28 AM       Result Value Ref Range    Prothrombin time 25.8 (*) 8.6 - 12.2 sec    INR 2.4 (*) 0.9 - 1.2     FERRITIN    Collection Time     04/27/13  6:28 AM       Result Value Ref Range    Ferritin 66  8 - 388 NG/ML   TRANSFERRIN    Collection Time     04/27/13  6:28 AM       Result Value Ref Range    Transferrin 293  202 - 364 mg/dL   CBC WITH AUTOMATED DIFF    Collection Time     04/27/13  6:28 AM       Result Value Ref Range    WBC 5.0  4.3 - 11.1 K/uL    RBC 2.41 (*) 4.05 - 5.25 M/uL    HGB 7.3 (*) 11.7 - 15.4 g/dL    HCT 16.122.5 (*) 09.635.8 - 46.3 %    MCV 93.4  79.6 - 97.8 FL    MCH 30.3  26.1 - 32.9 PG    MCHC 32.4  31.4 - 35.0 g/dL    RDW 04.513.6  40.911.9 - 81.114.6 %    PLATELET 275  150 - 450 K/uL    MPV 9.6 (*) 10.8 - 14.1 FL    DF AUTOMATED      NEUTROPHILS 43  43 - 78 %    LYMPHOCYTES 38   13 - 44 %    MONOCYTES 11  4.0 - 12.0 %    EOSINOPHILS 8 (*) 0.5 - 7.8 %    BASOPHILS 0  0.0 - 2.0 %    IMMATURE GRANULOCYTES 0.2  0.0 - 5.0 %    ABS. NEUTROPHILS 2.1  1.7 - 8.2 K/UL    ABS. LYMPHOCYTES 1.9  0.5 - 4.6 K/UL    ABS. MONOCYTES 0.5  0.1 - 1.3 K/UL    ABS. EOSINOPHILS 0.4  0.0 - 0.8 K/UL    ABS. BASOPHILS 0.0  0.0 - 0.2 K/UL    ABS. IMM. GRANS. 0.0  0.0 - 0.5 K/UL   TRANSFERRIN SATURATION    Collection Time     04/27/13  6:28 AM       Result Value Ref Range    Iron 49  35 - 150 ug/dL    TIBC 914324  782250 - 956450 ug/dL    Transferrin Saturation 15 (*) >20 %   HGB & HCT    Collection Time     04/27/13 12:45 PM       Result Value Ref Range    HGB 7.5 (*) 11.7 - 15.4 g/dL    HCT 21.323.0 (*) 08.635.8 - 46.3 %     CT head noncontrast 3/17:  FINDINGS: The brain appears normal. Exam is negative for acute hemorrhage,  mass, or mass effect. No definite CT evidence of acute major vascular territory  infarct.  Ventricles normal size and contour. No abnormal intracranial fluid collections.  Bony calvarium intact. Mastoid air cells and paranasal sinuses clear where  visualized.  IMPRESSION: Negative noncontrast CT brain.    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 Given total Vit K 7.5 mg PO so far with 4 units FFP. This AM INR 2.4. Will continue to monitor INR trend and see when safely able to resume on coumadin. Also involved pharmacy.  - Migraine: Chronic. Her chart indicates history of narcotic seeking. Continue prn analgesics. Will given dose  of SQ Imitrex.  - Recurrent PE/Factor V Leiden mutation/Factor II deficiency: Pt on chronic coumadin and is managed by Dr. Anner Crete. Pt reports that her goal INR is 3.0-3.5. Will request clinic notes.  - Depression/Anxiety/Bipolar disorder: Chronic. Continue her usual outpt regimen of Seroquel, Cymbalta and Klonopin.  - Fe deficiency anemia: No e/o Fe deficiency currently. Will monitor serial Hgb/Hct levels.  - Epistaxis: Resolved.   - Melena: Likely due to her supratherapeutic INR. Continue on IV Protonix. No recurrence noted since admission. May need GI involvement at some point but pt currently adamant about eating.  - ABLA: Her Hgb was 12.1 on 04/14/13 and on presentation had dropped to 9.6. Hgb this AM has dropped further to 7.3. Will transfuse 2 units PRBC and closely monitor Hgb/Hct trend.      Care Plan discussed with: Patient/Family and Nurse    Total time spent with patient: 25 minutes.    Darien Ramus, MD  04/27/2013  2:38 PM

## 2013-04-27 NOTE — Progress Notes (Signed)
Pt requests medication for nausea, see MAR for Zofran 4mg  IV given.    Pt requests medication for pain, states pain is 9/10 located in lower abd and head, see MAR for Dilaudid 1mg  SIVP given.

## 2013-04-27 NOTE — Progress Notes (Signed)
Assessment completed via doc flowsheet, respirations present, even and unlabored, HOB elevated, denies any SOB at this time, S1&S2 auscultated, HR regular, abd soft, tender, bowel sounds present in +4 quadrants, pt denies any N/V at this time, R port in place, last drsing change 04/26/13, infusing without difficulty, old scabs to BLE, SCDs in place and functioning, pt states being in some pain located in abd, pt had a BM earlier, dark, tarry stool noted, pt up voiding without difficulty, clear, yellow urine, pt instructed to call for assistance, pt verbalizes understanding, bed low and locked, side rails x2, call light within reach.       Pt complaining of pain in head and abd, 9/10 but pt sitting up in bed, relaxed expressions having conversations with primary RN.

## 2013-04-27 NOTE — Progress Notes (Signed)
Pt requests medication for nausea, see MAR for Zofran 4mg  IV given.    Pt requests medication for pain, states pain is 6/10 located in head, see MAR for Dilaudid 1mg  SIVP given.

## 2013-04-27 NOTE — Progress Notes (Signed)
Lab drawn through port.Flushed and new cap placed.medicated with Zofran and IV Dilaudid per patient request

## 2013-04-27 NOTE — Progress Notes (Signed)
Per patient request, called dr [place] Crane[place]res as patient in tears and needs more food to eat, agreed to let advance to full liquid for lunch and if tolerates advance to GI bland

## 2013-04-28 LAB — TYPE + CROSSMATCH
ABO/Rh(D): A POS
Antibody screen: NEGATIVE
Status of unit: TRANSFUSED
Status of unit: TRANSFUSED
Unit division: 0
Unit division: 0

## 2013-04-28 LAB — METABOLIC PANEL, BASIC
Anion gap: 12 mmol/L (ref 7–16)
BUN: 8 MG/DL (ref 6–23)
CO2: 29 mmol/L (ref 21–32)
Calcium: 8.1 MG/DL — ABNORMAL LOW (ref 8.3–10.4)
Chloride: 100 mmol/L (ref 98–107)
Creatinine: 0.9 MG/DL (ref 0.6–1.0)
GFR est AA: >60 mL/min/{1.73_m2} (ref >60)
GFR est non-AA: >60 mL/min/{1.73_m2} (ref >60)
Glucose: 85 mg/dL (ref 65–100)
Potassium: 3.9 mmol/L (ref 3.5–5.1)
Sodium: 141 mmol/L (ref 136–145)

## 2013-04-28 LAB — PLASMA, ALLOCATE
Status of unit: TRANSFUSED
Status of unit: TRANSFUSED
Unit division: 0
Unit division: 0

## 2013-04-28 LAB — HGB & HCT
HCT: 33.1 % — ABNORMAL LOW (ref 35.8–46.3)
HCT: 32.8 % — ABNORMAL LOW (ref 35.8–46.3)
HGB: 11 g/dL — ABNORMAL LOW (ref 11.7–15.4)
HGB: 10.7 g/dL — ABNORMAL LOW (ref 11.7–15.4)

## 2013-04-28 LAB — PROTHROMBIN TIME + INR
INR: 3.6 — CR (ref 0.9–1.2)
Prothrombin time: 39.2 s — ABNORMAL HIGH (ref 9.6–12.0)

## 2013-04-28 MED ADMIN — clonazePAM (KlonoPIN) tablet 1 mg: ORAL | @ 13:00:00 | NDC 00904610261

## 2013-04-28 MED ADMIN — pantoprazole (PROTONIX) 40 mg in sodium chloride 0.9 % 10 mL injection: INTRAVENOUS | @ 03:00:00 | NDC 00409488810

## 2013-04-28 MED ADMIN — QUEtiapine (SEROquel) tablet 300 mg: ORAL | @ 03:00:00 | NDC 68084053211

## 2013-04-28 MED ADMIN — ondansetron (ZOFRAN) injection 4 mg: INTRAVENOUS | @ 13:00:00 | NDC 23155037831

## 2013-04-28 MED ADMIN — DULoxetine (CYMBALTA) capsule 90 mg: ORAL | @ 13:00:00 | NDC 68001025704

## 2013-04-28 MED ADMIN — ondansetron (ZOFRAN) injection 4 mg: INTRAVENOUS | @ 17:00:00 | NDC 23155037831

## 2013-04-28 MED ADMIN — ondansetron (ZOFRAN) injection 4 mg: INTRAVENOUS | @ 01:00:00 | NDC 23155037831

## 2013-04-28 MED ADMIN — clonazePAM (KlonoPIN) tablet 1 mg: ORAL | @ 03:00:00 | NDC 00904610261

## 2013-04-28 MED ADMIN — dextrose 5% and 0.9% NaCl infusion: INTRAVENOUS | @ 23:00:00 | NDC 00409794109

## 2013-04-28 MED ADMIN — pantoprazole (PROTONIX) 40 mg in sodium chloride 0.9 % 10 mL injection: INTRAVENOUS | @ 13:00:00 | NDC 00409488810

## 2013-04-28 MED ADMIN — ondansetron (ZOFRAN) injection 4 mg: INTRAVENOUS | @ 22:00:00 | NDC 23155037831

## 2013-04-28 MED ADMIN — HYDROmorphone (PF) (DILAUDID) injection 1 mg: INTRAVENOUS | @ 01:00:00 | NDC 00409255201

## 2013-04-28 MED ADMIN — HYDROmorphone (PF) (DILAUDID) injection 1 mg: INTRAVENOUS | @ 17:00:00 | NDC 00409255201

## 2013-04-28 MED ADMIN — ondansetron (ZOFRAN) injection 4 mg: INTRAVENOUS | @ 10:00:00 | NDC 23155037831

## 2013-04-28 MED ADMIN — HYDROmorphone (PF) (DILAUDID) injection 1 mg: INTRAVENOUS | @ 13:00:00 | NDC 00409255201

## 2013-04-28 MED ADMIN — HYDROmorphone (PF) (DILAUDID) injection 1 mg: INTRAVENOUS | @ 10:00:00 | NDC 00409255201

## 2013-04-28 MED ADMIN — HYDROmorphone (PF) (DILAUDID) injection 1 mg: INTRAVENOUS | @ 22:00:00 | NDC 00409255201

## 2013-04-28 MED ADMIN — clonazePAM (KlonoPIN) tablet 1 mg: ORAL | @ 22:00:00 | NDC 00904610261

## 2013-04-28 MED ADMIN — dextrose 5% and 0.9% NaCl infusion: INTRAVENOUS | @ 10:00:00 | NDC 00409794109

## 2013-04-28 MED FILL — CLONAZEPAM 1 MG TAB: 1 mg | ORAL | Qty: 1

## 2013-04-28 MED FILL — ONDANSETRON (PF) 4 MG/2 ML INJECTION: 4 mg/2 mL | INTRAMUSCULAR | Qty: 2

## 2013-04-28 MED FILL — HYDROMORPHONE (PF) 1 MG/ML IJ SOLN: 1 mg/mL | INTRAMUSCULAR | Qty: 1

## 2013-04-28 MED FILL — SODIUM CHLORIDE 0.9 % INJECTION: INTRAMUSCULAR | Qty: 10

## 2013-04-28 MED FILL — QUETIAPINE 100 MG TAB: 100 mg | ORAL | Qty: 3

## 2013-04-28 MED FILL — DULOXETINE 60 MG CAP, DELAYED RELEASE: 60 mg | ORAL | Qty: 1

## 2013-04-28 NOTE — Progress Notes (Signed)
Patient resting in bed with iv fluids infusing via right chest port.  Room air noted.  Ambulating to bathroom to void.  Abdomen soft. resp even and unlabored.  Instructed to call for assistance.  Will continued to monitor.

## 2013-04-28 NOTE — Progress Notes (Signed)
Pt requests medication for nausea, see MAR for Zofran 4mg IV given.    Pt requests medication for pain, states pain is 7/10 located in abd, see MAR for Dilaudid 1mg SIVP given.

## 2013-04-28 NOTE — Progress Notes (Deleted)
Critical lab result called from [facility], Lab, STAT re-collect ordered.

## 2013-04-28 NOTE — Consults (Addendum)
[facility] Consult Note    Krista Lopez, DOB Aug 08, 1965       Referring Physician:  Dr Audria Nine    Consult Date:  04/28/2013    Admit Date:  04/26/2013    Chief Complaint:  Melena    Subjective:     History of Present Illness:  Patient is a 48 y.o. female seen in consultation at the request of Dr. Audria Nine for evaluation of melena.  She was admitted 3/17 with complaints of headache and nosebleeds, noted to have coagulopathy with INR > 6.8.  She reported accompanying n/v and melena as well as "pink" emesis.   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  Hgb on admission was 9.6, down from 12.1 ten days prior, and dropped to 7.3 the day following admission.  She was transfused 4 units of PRBC with expected increased in hgb which has remained stable and coagulopathy corrected to 2.4 with FFP, however back to 3.6 today.  She has continued to have melena and complains of lower abdominal cramping.  She denies use of asa or nsaids at home.      EGD 03/2012 with Dr Salley Hews for epigastric pain and anemia which revealed erosive gastritis.  Gastric bx pathology was h pylori negative  Colonoscopy 09/2008 with Dr Grover Canavan for anemia, revealing only hemorrhoids.            PMH:  Past Medical History   Diagnosis Date   ??? Factor V Leiden mutation    ??? Infectious disease 02/2009      Hx MRSA / E.Coli bacteremia, MRSA wound infection 2/2 port   ??? Factor II deficiency    ??? Calculus of kidney    ??? Depression    ??? Iron deficiency    ??? Migraines    ??? Pulmonary embolism Multiple episodes     x 16 events - recent hospitalizations 3/14   ??? Bipolar disorder    ??? Anxiety    ??? Restless leg syndrome        PSH:  Past Surgical History   Procedure Laterality Date   ??? Cystoscopy  ???     x 13   ??? Lithotripsy  2006   ??? Hx vascular access  July, 2010     Power Port placed in [place], [place] removed   ??? Hx vascular access Right 2013     pt has current rt subclavian port -  placed at [facility]   ??? Hx appendectomy  ???   ??? Hx tonsillectomy     ??? Hx cesarean section     ??? Hx tubal ligation     ??? Hx ovarian cyst removal       on R       Allergies:  Allergies   Allergen Reactions   ??? Ativan [Lorazepam] Itching   ??? Codeine Rash   ??? Iodinated Contrast Media - Iv Dye Rash     States also has swelling, and allergic to shellfish   ??? Ketorolac Tromethamine Rash   ??? Motrin [Ibuprofen] Other (comments)     Causes stomach upset after 3 doses.   ??? Pcn [Penicillins] Hives   ??? Pneumococcal Vaccine Swelling   ??? Pneumovax 23 [Pneumococcal 23-Val Ps Vaccine] Other (comments)     Local reaction   ??? Shellfish Containing Products Rash       Home Medications:  Prior to Admission medications    Medication Sig Start Date End  Date Taking? Authorizing Provider   warfarin (COUMADIN) 5 mg tablet Take 5 mg by mouth nightly.   Yes Historical Provider   ondansetron (ZOFRAN ODT) 4 mg disintegrating tablet Take 1 tablet by mouth every eight (8) hours as needed for Nausea. 03/09/13   Samella Parr, MD   QUEtiapine (SEROQUEL) 300 mg tablet TAKE 1 TABLET BY MOUTH EVERY NIGHT 02/07/13   Lopa S Bhansaly, DO   SUMAtriptan (IMITREX) 100 mg tablet Take 100 mg by mouth once as needed for Migraine.    Historical Provider   duloxetine (CYMBALTA) 30 mg capsule Take 3 capsules by mouth daily. Indications: Bipolar Disorder 12/06/12   Lopa S Bhansaly, DO   KLONOPIN 1 mg Tab Take 1 mg by mouth three (3) times daily. Indications: Anxiety disorder    Phys Other, MD       Hospital Medications:  Current Facility-Administered Medications   Medication Dose Route Frequency   ??? DULoxetine (CYMBALTA) capsule 90 mg  90 mg Oral DAILY   ??? clonazePAM (KlonoPIN) tablet 1 mg  1 mg Oral TID   ??? QUEtiapine (SEROquel) tablet 300 mg  300 mg Oral QHS   ??? sodium chloride (NS) flush 5-10 mL  5-10 mL IntraVENous Q8H   ??? sodium chloride (NS) flush 5-10 mL  5-10 mL IntraVENous PRN   ??? HYDROmorphone (PF) (DILAUDID) injection 1 mg  1 mg IntraVENous  Q4H PRN   ??? traMADol (ULTRAM) tablet 50 mg  50 mg Oral Q6H PRN   ??? HYDROcodone-acetaminophen (NORCO) 5-325 mg per tablet 1 Tab  1 Tab Oral Q4H PRN   ??? acetaminophen (TYLENOL) tablet 650 mg  650 mg Oral Q4H PRN   ??? metoclopramide HCl (REGLAN) injection 10 mg  10 mg IntraVENous Q4H PRN   ??? ondansetron (ZOFRAN) injection 4 mg  4 mg IntraVENous Q4H PRN   ??? naloxone (NARCAN) injection 0.4 mg  0.4 mg IntraVENous PRN   ??? 0.9% sodium chloride infusion 250 mL  250 mL IntraVENous PRN   ??? pantoprazole (PROTONIX) 40 mg in sodium chloride 0.9 % 10 mL injection  40 mg IntraVENous Q12H   ??? dextrose 5% and 0.9% NaCl infusion  75 mL/hr IntraVENous CONTINUOUS   ??? 0.9% sodium chloride infusion 250 mL  250 mL IntraVENous PRN   ??? lip protectant (BLISTEX) ointment   Topical PRN       Social History:  History   Substance Use Topics   ??? Smoking status: Never Smoker    ??? Smokeless tobacco: Never Used   ??? Alcohol Use: Yes      Comment: rarely - only drinks 2 times per year         Family History:  Family History   Problem Relation Age of Onset   ??? Bleeding Prob Father      Factor V   ??? Cancer Maternal Grandmother      metastatic breast CA.   ??? Ovarian Cancer Mother 26     passed away due to ca   ??? Bipolar Disorder Mother    ??? Kidney Disease Brother      kidney stones       Review of Systems:  A detailed 10 system ROS is obtained, with pertinent positives as listed above.  All others are negative.    Diet:  Regular     Objective:     Physical Exam:  Vitals:  BP 116/77    Pulse 90    Temp(Src) 97.6 ??F (36.4 ??C)  Resp 18    Ht 5\' 4"  (1.626 m)    Wt 63.05 kg (139 lb)    BMI 23.85 kg/m2      SpO2 95%    LMP 07/28/2004     Gen:  Pt is alert, cooperative, no acute distress  Skin:  Extremities and face reveal no rashes. Pale.  HEENT: Sclerae anicteric.  Extra-occular muscles are intact. The neck is supple.  Cardiovascular: Regular rate and rhythm. No murmurs, gallops, or rubs.  Respiratory:  Comfortable breathing with no accessory muscle use.  Clear breath sounds anteriorly with no wheezes, rales, or rhonchi.  GI:  Abdomen nondistended, soft, mild lower abdominal tenderness with palpation.  Active bowel sounds.  No enlargement of the liver or spleen. No masses palpable.  Rectal:  Deferred  Musculoskeletal:  No pitting edema of the lower legs.   Neurological:  Gross memory appears intact.  Patient is alert and oriented.  Psychiatric:  Mood appears appropriate with judgement intact.  Lymphatic:  No cervical or supraclavicular adenopathy.    Laboratory:    Recent Labs      04/28/13   1240  04/28/13   0524  04/27/13   1245  04/27/13   0628  04/26/13   2210  04/26/13   1407  04/26/13   1236   WBC   --    --    --   5.0   --    --   6.5   HGB  10.7*  11.0*  7.5*  7.3*  7.8*   --   9.6*   HCT  32.8*  33.1*  23.0*  22.5*  23.7*   --   28.8*   PLT   --    --    --   275   --    --   349   MCV   --    --    --   93.4   --    --   92.3   NA   --   141   --   143   --    --   138   K   --   3.9   --   3.9   --    --   4.6   CL   --   100   --   107   --    --   103   CO2   --   29   --   30   --    --   28   BUN   --   8   --   8   --    --   11   CREA   --   0.90   --   0.80   --    --   0.70   CA   --   8.1*   --   7.8*   --    --   8.4   GLU   --   85   --   94   --    --   94   PTP   --   39.2*   --   25.8*   --   >100.0*  NO CLOT DETECTED   INR   --   3.6*   --   2.4*   --   >6.8*  NO CLOT DETECTED  Assessment:       Principal Problem:    Coagulopathy (04/26/2013)    Active Problems:    Pulmonary embolism, Recurrent (04/26/2013)      Depression (04/26/2013)      Anxiety (04/26/2013)      Bipolar 2 disorder (04/26/2013)      Migraine (04/26/2013)      Iron deficiency anemia (04/26/2013)      Factor V Leiden mutation (04/26/2013)      Factor II deficiency (04/26/2013)      Epistaxis (04/26/2013)      Melena (04/26/2013)      Acute blood loss anemia (04/26/2013)        Plan:       48 yo female admitted 04/26/13 by the hospitalist service after presenting with headaches  and nosebleed, noted coagulopathic with INR >6.8.  She has been on Coumadin for hx of Factor C Leiden and pulmonary embolism.  She was transfused 4 unit PRBC after significant drop in hgb.  It has remained stable but she has continued to have frequent loose black stools.  INR improved after FFP at 2.4 but was back to 3.6 despite cessation of Coumadin on admission.  Hx of gastritis on EGD last year is noted.      1.  Continue Protonix  2.  Plan diagnostic EGD on Friday in evaluation; will require transport downtown for procedure.   Risks discussed.  3.  Follow hgb, transfuse as needed      Patient is seen and examined in collaboration with Dr. Yetta BarreJones.  Assessment and plan as per Dr. Yetta BarreJones.  Ginette PitmanJulie Hammond NP      I have seen and examined the pt; agree with the above. Transfusion requiring anemia due to acute blood loss (nose, vaginal, rectal) in the setting of supratherapeutic INR. Still with melena. Nose bleeds stopped yesterday. Melena could be due to swallowed blood from epistaxis vs primary upper GI source. Will proceed with EGD in the am for further evaluation. Ideally, we could like her INR lower if possible (without putting her at inc'd risk)- defer to the primary team.    P Vianne BullsAllen Tarris Delbene, MD

## 2013-04-28 NOTE — Progress Notes (Signed)
Warfarin dosing per pharmacist    Ron AgeeShannon Lopez is a 48 y.o. female.    Height: 5\' 4"  (162.6 cm)    Weight: 63.05 kg (139 lb)    Indication:  Factor V Leiden, Factor II deficiency, Hx of multiple PEs    Goal INR:  3 - 3.5 (Pt reports that Hematology specified this goal INR)    Home dose:  5 mg daily    Risk factors or significant drug interactions:  None     Other anticoagulants:  None     Daily Monitoring  Date  INR     Warfarin dose HGB              Notes  3/19  3.6  ---  11    3/18  2.4  ---  7.3    3/17@1407  >6.8  ---  7.8    3/17@1236  Undetectable ---  9.6     Pt rec'd Vit K po 7.5 mg total dose and FFP x 4 units total      INR still fluctuating daily and remains supra-therapeutic today.    Would recommend holding Coumadin again tonight.  Pharmacy will continue to follow patient closely.       Thank you,    Marda Stalkereanna Clark, PharmD

## 2013-04-28 NOTE — Progress Notes (Signed)
Hospitalist Progress Note    Subjective:   Daily Progress Note: 04/28/2013 2:13 PM    Pt admitted c/o melena and nose bleed with elevated INR. Also had acute blood loss anemia requiring transfusion and hb relatively stable since.    Pt denies further nose bleed but c/o melena this am.  C/o lower abd/suprapubic discomfort. Wants diet advanced to regular.  INR trended up a little today    Current Facility-Administered Medications   Medication Dose Route Frequency   ??? DULoxetine (CYMBALTA) capsule 90 mg  90 mg Oral DAILY   ??? clonazePAM (KlonoPIN) tablet 1 mg  1 mg Oral TID   ??? QUEtiapine (SEROquel) tablet 300 mg  300 mg Oral QHS   ??? sodium chloride (NS) flush 5-10 mL  5-10 mL IntraVENous Q8H   ??? sodium chloride (NS) flush 5-10 mL  5-10 mL IntraVENous PRN   ??? HYDROmorphone (PF) (DILAUDID) injection 1 mg  1 mg IntraVENous Q4H PRN   ??? traMADol (ULTRAM) tablet 50 mg  50 mg Oral Q6H PRN   ??? HYDROcodone-acetaminophen (NORCO) 5-325 mg per tablet 1 Tab  1 Tab Oral Q4H PRN   ??? acetaminophen (TYLENOL) tablet 650 mg  650 mg Oral Q4H PRN   ??? metoclopramide HCl (REGLAN) injection 10 mg  10 mg IntraVENous Q4H PRN   ??? ondansetron (ZOFRAN) injection 4 mg  4 mg IntraVENous Q4H PRN   ??? naloxone (NARCAN) injection 0.4 mg  0.4 mg IntraVENous PRN   ??? 0.9% sodium chloride infusion 250 mL  250 mL IntraVENous PRN   ??? pantoprazole (PROTONIX) 40 mg in sodium chloride 0.9 % 10 mL injection  40 mg IntraVENous Q12H   ??? dextrose 5% and 0.9% NaCl infusion  75 mL/hr IntraVENous CONTINUOUS   ??? 0.9% sodium chloride infusion 250 mL  250 mL IntraVENous PRN   ??? lip protectant (BLISTEX) ointment   Topical PRN        Review of Systems  A comprehensive review of systems was negative except for that written in the HPI.    Objective:     BP 116/77    Pulse 90    Temp(Src) 97.6 ??F (36.4 ??C)    Resp 18    Ht 5\' 4"  (1.626 m)    Wt 63.05 kg (139 lb)    BMI 23.85 kg/m2      SpO2 95%    LMP 07/28/2004      O2 Device: Room air    Temp (24hrs), Avg:97.6 ??F (36.4 ??C),  Min:96.6 ??F (35.9 ??C), Max:98.8 ??F (37.1 ??C)      03/19 0700 - 03/19 1859  In: -   Out: 800 [Urine:800]  03/17 1900 - 03/19 0659  In: 4945 [P.O.:1634; I.V.:2886]  Out: 3600 [Urine:3600]    General appearance: alert, cooperative, no distress, appears stated age  Lungs: clear to auscultation bilaterally  Heart: regular rate and rhythm, S1, S2 normal  Abdomen: soft, non-tender. Bowel sounds normal. No masses,  no organomegaly  Extremities: extremities normal, atraumatic, no cyanosis or edema  Psych: A+OX3    Additional comments:I reviewed the patient's new clinical lab test results. .    Data Review    Recent Results (from the past 24 hour(s))   MRSA SCREEN - PCR (NASAL)    Collection Time     04/27/13  2:30 PM       Result Value Ref Range    Special Requests: NO SPECIAL REQUESTS      Culture result:  Value: MRSA target DNA is not detected (presumptive not colonized with MRSA)   PROTHROMBIN TIME + INR    Collection Time     04/28/13  5:24 AM       Result Value Ref Range    Prothrombin time 39.2 (*) 9.6 - 12.0 sec    INR 3.6 (*) 0.9 - 1.2     HGB & HCT    Collection Time     04/28/13  5:24 AM       Result Value Ref Range    HGB 11.0 (*) 11.7 - 15.4 g/dL    HCT 16.133.1 (*) 09.635.8 - 46.3 %   METABOLIC PANEL, BASIC    Collection Time     04/28/13  5:24 AM       Result Value Ref Range    Sodium 141  136 - 145 mmol/L    Potassium 3.9  3.5 - 5.1 mmol/L    Chloride 100  98 - 107 mmol/L    CO2 29  21 - 32 mmol/L    Anion gap 12  7 - 16 mmol/L    Glucose 85  65 - 100 mg/dL    BUN 8  6 - 23 MG/DL    Creatinine 0.450.90  0.6 - 1.0 MG/DL    GFR est AA >40>60  >98>60 ml/min/1.6173m2    GFR est non-AA >60  >60 ml/min/1.5573m2    Calcium 8.1 (*) 8.3 - 10.4 MG/DL   HGB & HCT    Collection Time     04/28/13 12:40 PM       Result Value Ref Range    HGB 10.7 (*) 11.7 - 15.4 g/dL    HCT 11.932.8 (*) 14.735.8 - 46.3 %         Assessment/Plan:     Principal Problem:    Coagulopathy (04/26/2013)    Active Problems:    Pulmonary embolism, 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  Hb ok after transfusion.  Will continue to monitor. Consult GI due to ongoing melena. Watch INR.  If hb stabilizes then may consider discharge.    Care Plan discussed with: Patient/Family    Total time spent with patient: 20 minutes.

## 2013-04-29 LAB — URINALYSIS W/ RFLX MICROSCOPIC
Bacteria: 0 /hpf
Bilirubin: NEGATIVE
Glucose: NEGATIVE mg/dL
Ketone: NEGATIVE mg/dL
Leukocyte Esterase: NEGATIVE
Nitrites: NEGATIVE
Protein: NEGATIVE mg/dL
Specific gravity: 1.011 (ref 1.001–1.023)
Urobilinogen: 0.2 EU/dL (ref 0.2–1.0)
pH (UA): 6.5 (ref 5.0–9.0)

## 2013-04-29 LAB — HGB & HCT
HCT: 34.8 % — ABNORMAL LOW (ref 35.8–46.3)
HGB: 10.3 g/dL — ABNORMAL LOW (ref 11.7–15.4)

## 2013-04-29 LAB — PROTHROMBIN TIME + INR
INR: 4.1 — CR (ref 0.9–1.2)
Prothrombin time: 44.9 s — ABNORMAL HIGH (ref 9.6–12.0)

## 2013-04-29 MED ORDER — MIDAZOLAM 1 MG/ML IJ SOLN
1 mg/mL | Freq: Once | INTRAMUSCULAR | Status: AC
Start: 2013-04-29 — End: 2013-04-29
  Administered 2013-04-29: 17:00:00 via INTRAVENOUS

## 2013-04-29 MED ORDER — MIDAZOLAM 1 MG/ML IJ SOLN
1 mg/mL | INTRAMUSCULAR | Status: DC
Start: 2013-04-29 — End: 2013-04-29

## 2013-04-29 MED ORDER — WARFARIN 5 MG TAB
5 mg | Freq: Every evening | ORAL | Status: DC
Start: 2013-04-29 — End: 2013-04-30

## 2013-04-29 MED ORDER — SODIUM CHLORIDE 0.9 % IJ SYRG
INTRAMUSCULAR | Status: DC | PRN
Start: 2013-04-29 — End: 2013-04-29

## 2013-04-29 MED ORDER — HYDROMORPHONE (PF) 1 MG/ML IJ SOLN
1 mg/mL | INTRAMUSCULAR | Status: DC
Start: 2013-04-29 — End: 2013-04-29

## 2013-04-29 MED ORDER — SODIUM CHLORIDE 0.9 % IJ SYRG
Freq: Three times a day (TID) | INTRAMUSCULAR | Status: DC
Start: 2013-04-29 — End: 2013-04-29

## 2013-04-29 MED ORDER — DIATRIZOATE MEGLUMINE & SODIUM 66 %-10 % ORAL SOLN
66-10 % | Freq: Once | ORAL | Status: AC
Start: 2013-04-29 — End: 2013-04-30
  Administered 2013-04-30: 13:00:00 via ORAL

## 2013-04-29 MED ORDER — DIATRIZOATE MEGLUMINE & SODIUM 66 %-10 % ORAL SOLN
66-10 % | Freq: Once | ORAL | Status: AC
Start: 2013-04-29 — End: 2013-04-30

## 2013-04-29 MED ORDER — SODIUM CHLORIDE 0.9 % IJ SYRG
INTRAMUSCULAR | Status: DC | PRN
Start: 2013-04-29 — End: 2013-05-03

## 2013-04-29 MED ORDER — SODIUM CHLORIDE 0.9 % IJ SYRG
Freq: Three times a day (TID) | INTRAMUSCULAR | Status: DC
Start: 2013-04-29 — End: 2013-05-03
  Administered 2013-04-29 – 2013-05-03 (×10): via INTRAVENOUS

## 2013-04-29 MED ORDER — HYDROMORPHONE (PF) 1 MG/ML IJ SOLN
1 mg/mL | Freq: Once | INTRAMUSCULAR | Status: AC
Start: 2013-04-29 — End: 2013-04-29
  Administered 2013-04-29: 16:00:00 via INTRAVENOUS

## 2013-04-29 MED ORDER — LACTATED RINGERS IV
INTRAVENOUS | Status: DC
Start: 2013-04-29 — End: 2013-04-29
  Administered 2013-04-29: 18:00:00 via INTRAVENOUS

## 2013-04-29 MED ADMIN — propofol (DIPRIVAN) 10 mg/mL injection: INTRAVENOUS | @ 18:00:00 | NDC 54569434000

## 2013-04-29 MED ADMIN — ondansetron (ZOFRAN) injection 4 mg: INTRAVENOUS | @ 21:00:00 | NDC 23155037831

## 2013-04-29 MED ADMIN — clonazePAM (KlonoPIN) tablet 1 mg: ORAL | @ 21:00:00 | NDC 00904610261

## 2013-04-29 MED ADMIN — dextrose 5% and 0.9% NaCl infusion: INTRAVENOUS | @ 21:00:00 | NDC 00409794109

## 2013-04-29 MED ADMIN — HYDROmorphone (PF) (DILAUDID) injection 1 mg: INTRAVENOUS | @ 12:00:00 | NDC 00409255201

## 2013-04-29 MED ADMIN — clonazePAM (KlonoPIN) tablet 1 mg: ORAL | @ 01:00:00 | NDC 00904610261

## 2013-04-29 MED ADMIN — DULoxetine (CYMBALTA) capsule 90 mg: ORAL | @ 21:00:00 | NDC 68001025704

## 2013-04-29 MED ADMIN — QUEtiapine (SEROquel) tablet 300 mg: ORAL | @ 01:00:00 | NDC 68084053211

## 2013-04-29 MED ADMIN — ondansetron (ZOFRAN) injection 4 mg: INTRAVENOUS | @ 07:00:00 | NDC 23155037831

## 2013-04-29 MED ADMIN — pantoprazole (PROTONIX) 40 mg in sodium chloride 0.9 % 10 mL injection: INTRAVENOUS | @ 01:00:00 | NDC 00409488810

## 2013-04-29 MED ADMIN — propofol (DIPRIVAN) 10 mg/mL injection: INTRAVENOUS | @ 18:00:00 | NDC 63323027025

## 2013-04-29 MED ADMIN — HYDROmorphone (PF) (DILAUDID) injection 1 mg: INTRAVENOUS | @ 21:00:00 | NDC 00409255201

## 2013-04-29 MED ADMIN — HYDROmorphone (PF) (DILAUDID) injection 1 mg: INTRAVENOUS | @ 07:00:00 | NDC 00409255201

## 2013-04-29 MED ADMIN — HYDROmorphone (PF) (DILAUDID) injection 1 mg: INTRAVENOUS | @ 02:00:00 | NDC 00409255201

## 2013-04-29 MED ADMIN — ondansetron (ZOFRAN) injection 4 mg: INTRAVENOUS | @ 12:00:00 | NDC 23155037831

## 2013-04-29 MED ADMIN — phytonadione (MEPHYTON) tablet 2.5 mg: ORAL | @ 13:00:00 | NDC 00187170405

## 2013-04-29 MED ADMIN — dextrose 5% and 0.9% NaCl infusion: INTRAVENOUS | @ 10:00:00 | NDC 00409794109

## 2013-04-29 MED ADMIN — ondansetron (ZOFRAN) injection 4 mg: INTRAVENOUS | @ 02:00:00 | NDC 23155037831

## 2013-04-29 MED FILL — PREDNISONE 20 MG TAB: 20 mg | ORAL | Qty: 2

## 2013-04-29 MED FILL — QUETIAPINE 100 MG TAB: 100 mg | ORAL | Qty: 3

## 2013-04-29 MED FILL — HYDROMORPHONE (PF) 1 MG/ML IJ SOLN: 1 mg/mL | INTRAMUSCULAR | Qty: 1

## 2013-04-29 MED FILL — DEXTROSE 5% IN NORMAL SALINE IV: INTRAVENOUS | Qty: 1000

## 2013-04-29 MED FILL — ONDANSETRON (PF) 4 MG/2 ML INJECTION: 4 mg/2 mL | INTRAMUSCULAR | Qty: 2

## 2013-04-29 MED FILL — CLONAZEPAM 1 MG TAB: 1 mg | ORAL | Qty: 1

## 2013-04-29 MED FILL — SODIUM CHLORIDE 0.9 % IV: INTRAVENOUS | Qty: 1000

## 2013-04-29 MED FILL — PROPOFOL 10 MG/ML IV EMUL: 10 mg/mL | INTRAVENOUS | Qty: 123.2

## 2013-04-29 MED FILL — DULOXETINE 60 MG CAP, DELAYED RELEASE: 60 mg | ORAL | Qty: 1

## 2013-04-29 MED FILL — SODIUM CHLORIDE 0.9 % INJECTION: INTRAMUSCULAR | Qty: 10

## 2013-04-29 MED FILL — MEPHYTON 5 MG TABLET: 5 mg | ORAL | Qty: 0.5

## 2013-04-29 MED FILL — MIDAZOLAM 1 MG/ML IJ SOLN: 1 mg/mL | INTRAMUSCULAR | Qty: 5

## 2013-04-29 NOTE — Progress Notes (Signed)
GI--EGD performed with minimal difficulty.  Findings 1) Multiple antral ulcers-no visible vessels or active bleeding noted.  2) Hiatal hernia sac.  Patient tolerated the procedure well.  Recommend 1) Correct coagulopathy 2) Transfuse as necessary 3) Further evaluation of lower abdominal pain (c/o this discomfort prior to the procedure). 4) IV PPI therapy 5) H. Pylori titers.  Thanks Wilford CornerMichael I Chayton Murata, MD

## 2013-04-29 NOTE — Progress Notes (Signed)
assessment completed-HR reg, lungs clear, INR high at 4.1- talked to dr Audria Ninemcpherson going to give 2 units FFP before p/u for transport at 1045, BS +, VSS, EMTLA compete

## 2013-04-29 NOTE — Progress Notes (Signed)
TRANSFER - OUT REPORT:    Verbal report given to Heather RN on MattelShannon Lopez  being transferred to [facility] for routine progression of care       Report consisted of patient???s Situation, Background, Assessment and   Recommendations(SBAR).     Information from the following report(s) Procedure Summary was reviewed with the receiving nurse.    Opportunity for questions and clarification was provided.

## 2013-04-29 NOTE — Progress Notes (Signed)
Drew blood from PICC for H/H

## 2013-04-29 NOTE — Progress Notes (Signed)
Patient reports that needs that be given 50 mg benadryl and loads of solumedrol before taking any contrast dye-placed call to dr Audria Ninemcpherson and awaiting to hear back from her

## 2013-04-29 NOTE — Progress Notes (Signed)
Dilaudid given slow IVP for abd pain rated at a 8 out of 10. Zofran given slow IVP for nausea. See MAR.

## 2013-04-29 NOTE — Procedures (Signed)
[facility]                            [address]                            [place] [id number]                                [phone]                                 PROCEDURE NOTE    NAME:  Krista Lopez, Krista Lopez                           MR:  [id number]  LOC:  ENDOENDOPL            SEX:  F                 ACCT:  [id number]  DOB:  [birth year]            AGE:  47                PT:  S  ADMIT:  04/29/2013          DSCH:  04/29/2013       MSV:        DATE OF PROCEDURE: 04/29/2013    NAME OF PROCEDURE: Esophagogastroduodenoscopy.    SUBJECTIVE: A 48 year old female is undergoing upper endoscopy because of  melena (in the setting of anticoagulation for factor V Leiden and factor  II deficiency). Upper endoscopy to document the presence or absence of  upper tract source for the above. The patient still has a significant  coagulopathy, and biopsies are not being planned. Risks (bleeding,  perforation, infection, untoward cardiovascular or respiratory  mortality), benefits, and alternatives were discussed in detail with the  patient, who agrees to proceed.    ANESTHESIA: As per MAC anesthesia.    INSTRUMENT: GIF-Q180 videoscope.    PROCEDURE: The videoscope was passed through the upper pharynx with  minimal difficulty into the esophagus. Proximal, mid, and distal  esophagus were unremarkable except for a hiatal hernia sac. Once past the  hernia sac, the remaining body of the stomach was unremarkable. However,  in the antrum along the lesser curve, posterior wall, 4 ulcers were seen.  None of these ulcers had a visible vessel and there was no bleeding from  the ulcer. In the anterior wall, there was a small erosion present. A  retroflex view of the angularis was normal. Retroflex view of the EG  junction and cardia ________. Attention was turned to the duodenum.  Duodenal bulb and C-loop were normal. No active ulcer crater was seen.  The endoscope was brought back to  the stomach where photographs were  obtained. Because of no visible vessels or active bleeding, therapeutic  intervention was not performed. The endoscope ________ stomach,  ________ esophagus. The vocal cords ________ the recovery area in stable  condition.    IMPRESSION:  1. Multiple antral ulcers as described above, probable source of melena.  2. Hiatal hernia sac.    PLAN:  DIAGNOSTIC:  1. Check H pylori titers.  2. Follow hemoglobin and hematocrit.  THERAPEUTIC:  1. Correction  of coagulopathy per primary team.  2. IV PPI therapy.  3. Long-term PPI therapy given long-term need for anticoagulation.  4. Followup EGD in approximately 8 weeks to document healing.                  Wilford Corner, MD                This is an unverified document unless signed by physician.    TID:  [id number]              DIC ID:  [id number]         DT:  04/30/2013 08:43 A  JOB:  [id number]           DOC#:  [id number]           DD:  04/29/2013    cc:   Irma Newness, DO        Wilford Corner, MD

## 2013-04-29 NOTE — Progress Notes (Signed)
Assessment complete via flow sheet. Patient alert and oriented X 4. Respirations even and unlabored. S1 and S2 auscultated. Abd soft and tender. Patient reports abd pain rated at a 8/10. Medication to be administered. Patient instructed to call with needs. Bed low/locked with call light with in reach.

## 2013-04-29 NOTE — Progress Notes (Signed)
GI     Pt with multiple, nonbleeding gastric ulcers seen on EGD, none requiring therapy/intervention. Recommend PPI bid x 8wks, then daily. Watch INR carefully as supratherapeutic levels could exacerbate bleeding. Abd pain- ?renal stone? X-ray with possible stone 2 wks ago- w/u per primary team. GI will sign off. Please call back with questions or concerns.    P Vianne BullsAllen Jamelle Goldston, MD

## 2013-04-29 NOTE — Anesthesia Post-Procedure Evaluation (Signed)
Post-Anesthesia Evaluation and Assessment    Patient: Krista AgeeShannon Lopez MRN: [id number]  SSN: [id number]    Date of Birth: 1965-08-06  Age: 48 y.o.  Sex: female       Cardiovascular Function/Vital Signs  Visit Vitals   Item Reading   ??? BP 141/65   ??? Pulse 66   ??? Temp 36.7 ??C (98 ??F)   ??? Resp 16   ??? SpO2 94%       Patient is status post total IV anesthesia anesthesia for Procedure(s):  ESOPHAGOGASTRODUODENOSCOPY (EGD) / BMI=24/ ROOM 340 @ [facility].    Nausea/Vomiting: None    Postoperative hydration reviewed and adequate.    Pain:  Pain Scale 1: Visual (04/29/13 1428)  Pain Intensity 1: 0 (04/29/13 1428)   Managed    Neurological Status:       At baseline    Mental Status and Level of Consciousness: Alert and oriented     Pulmonary Status:   O2 Device: Nasal cannula (04/29/13 1428)   Adequate oxygenation and airway patent    Complications related to anesthesia: None    Post-anesthesia assessment completed. No concerns    Signed By: Hal NeerPaul E Laretha Luepke, MD     April 29, 2013

## 2013-04-29 NOTE — Anesthesia Pre-Procedure Evaluation (Addendum)
Anesthetic History              Review of Systems / Medical History  Patient summary reviewed, nursing notes reviewed and pertinent labs reviewed    Pulmonary                 Neuro/Psych         Headaches (Migraines) and psychiatric history (Bipolar disorder)    Comments: RLS Cardiovascular                Exercise tolerance: >4 METS  Comments: Factor V Leiden mutation; multiple episodes DVT / pulm emboli.  On warfarin.   GI/Hepatic/Renal         Renal disease: stones         Endo/Other             Other Findings   Comments: Opioid dependency         Physical Exam    Airway  Mallampati: I  TM Distance: 4 - 6 cm  Neck ROM: normal range of motion   Mouth opening: Normal     Cardiovascular    Rhythm: regular  Rate: normal         Dental    Dentition: Upper partial plate     Pulmonary  Breath sounds clear to auscultation               Abdominal  GI exam deferred       Other Findings            Anesthetic Plan    ASA: 3  Anesthesia type: total IV anesthesia            Anesthetic plan and risks discussed with: Patient

## 2013-04-29 NOTE — Progress Notes (Signed)
Report given renee in GI lab

## 2013-04-29 NOTE — Progress Notes (Signed)
TRANSFER - IN REPORT:    Verbal report received from Melissa, RN(name) on Krista Lopez  being received from EGD(unit) for routine progression of care      Report consisted of patient???s Situation, Background, Assessment and   Recommendations(SBAR).     Information from the following report(s) Procedure Summary and Recent Results was reviewed with the receiving nurse.    Opportunity for questions and clarification was provided.

## 2013-04-29 NOTE — Progress Notes (Signed)
Date of Surgery Update:  Krista Lopez was seen and examined.  History and physical has been reviewed. There have been no significant clinical changes since the completion of the originally dated History and Physical.    Signed By: Wilford CornerMichael I Kevyn Wengert, MD     April 29, 2013 2:16 PM

## 2013-04-29 NOTE — Progress Notes (Signed)
[facility] here to transport [facility].  Pt transported with stable vital signs [facility].

## 2013-04-29 NOTE — Progress Notes (Signed)
Dr Jonell Cluckickoff here speaking to the patient about her complaint about pain, the procedure and the procedure results.

## 2013-04-29 NOTE — Progress Notes (Signed)
Problem: Nutrition Deficit  Goal: *Optimize nutritional status  Nutrition  Reason for assessment:  Length of stay-day 3  Assessment:   Diet order(s): Clear liquid initiated for supper tonight; had been NPO for EGD.  Most recent diet prior to NPO for EGD had been a GI soft diet.  Food/Nutrition Patient History:  Pt reports stable weight but eating poorly r/t decreased appetite prior to admission. Pt with c/o abdominal pain.  Anthropometrics:Height: 5\' 4"  (162.6 cm),  Weight: 63.05 kg (139 lb), Weight source not identified, Body mass index is 23.85 kg/(m^2). BMI class of normal range.   Macronutrient needs:  EER:  1578-1893 kcal /day (25-30 kcal/kg BW)  EPR:  44-55 grams protein/day (0.8-1 grams/kg IBW)  Intake/Comparative Standards: Average intake for past 3 day(s)/3 recorded meal(s): 75% intake. Pt potentially meeting ~ 85% kcal goal and 100% protein goal while on GI soft diet.  Pt currently not meeting estimated kcal and protein needs r/t diet restriction of clear liquids.  Nutrition Diagnosis: Inadequate oral intake r/t limited access to food as evidenced by clear liquid diet.  Intervention:  1. Meals and snacks: Continue current diet and advance per MD recommendation. RD available to make recommendations for EN If patient unable to tolerate diet advancement to full liquid or > with > 50% consumption within next 4-5 days.  Monitoring/Evaluation:  1. % intake of meals, diet progression  2. Actual weight    Raleigh CallasPat Brady, RD, LD, MPH  [phone]

## 2013-04-29 NOTE — Progress Notes (Signed)
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  Goal INR is 3 to 3.5.    Lab Results   Component Value Date/Time    INR 4.1 04/29/2013  6:31 AM    HGB 10.3 04/29/2013  1:00 AM     Will hold off on administering Warfarin today.    Arta SilenceKevin E Aftan Vint, Pharm D  Clinical Pharmacist

## 2013-04-29 NOTE — Progress Notes (Signed)
Gastrointestinal Progress Note    04/29/2013    Admit Date: 04/29/2013    Subjective:     Patient is on NPO for EGD    Pain: Patient complains of abdominal pain.  The pain is located in the RLQ. The pain is described as sharp, and is 6/10 in intensity.     Bowel Movements: Normal    Bleeding:  melena    Current Facility-Administered Medications   Medication Dose Route Frequency   ??? lactated ringers infusion  100 mL/hr IntraVENous CONTINUOUS   ??? sodium chloride (NS) flush 5-10 mL  5-10 mL IntraVENous Q8H   ??? sodium chloride (NS) flush 5-10 mL  5-10 mL IntraVENous PRN   ??? HYDROmorphone (PF) (DILAUDID) 1 mg/mL injection       ??? midazolam (VERSED) 1 mg/mL injection            Objective:     Blood pressure 141/65, pulse 66, temperature 98 ??F (36.7 ??C), resp. rate 16, last menstrual period 07/28/2004, SpO2 94 %.    03/20 0700 - 03/20 1859  In: 900 [I.V.:900]  Out: -          EXAM:  HEART: regular rate and rhythm, S1, S2 normal, no murmur, click, rub or gallop, LUNGS: chest clear, no wheezing, rales, normal symmetric air entry, Heart exam - S1, S2 normal, no murmur, no gallop, rate regular and ABDOMEN:  Bowel sounds are normal, liver is not enlarged, spleen is not enlarged. Tenderness noted in the RLQ and LLQ without rebound.    Data Review    Recent Results (from the past 24 hour(s))   URINALYSIS W/ RFLX MICROSCOPIC    Collection Time     04/28/13  9:09 PM       Result Value Ref Range    Color YELLOW      Appearance CLEAR      Specific gravity 1.011  1.001 - 1.023      pH (UA) 6.5  5.0 - 9.0      Protein NEGATIVE   NEG mg/dL    Glucose NEGATIVE       Ketone NEGATIVE   NEG mg/dL    Bilirubin NEGATIVE   NEG      Blood SMALL (*) NEG      Urobilinogen 0.2  0.2 - 1.0 EU/dL    Nitrites NEGATIVE   NEG      Leukocyte Esterase NEGATIVE   NEG      WBC 0-3  0 /hpf    RBC 10-20  0 /hpf    Epithelial cells 0-3  0 /hpf    Bacteria 0  0 /hpf    Casts 0-3  0 /lpf   HGB & HCT    Collection Time     04/29/13  1:00 AM       Result Value Ref  Range    HGB 10.3 (*) 11.7 - 15.4 g/dL    HCT 96.034.8 (*) 45.435.8 - 46.3 %   PROTHROMBIN TIME + INR    Collection Time     04/29/13  6:31 AM       Result Value Ref Range    Prothrombin time 44.9 (*) 9.6 - 12.0 sec    INR 4.1 (*) 0.9 - 1.2     FFP, ALLOCATE    Collection Time     04/29/13  8:00 AM       Result Value Ref Range    Unit number [id number]      Blood  component type FP 24h,Thaw      Unit division B0      Status of unit ISSUED      Unit number [id number]      Blood component type FP24H,THAW      Unit division 00      Status of unit ISSUED         Assessment:     Active Problems:   Melena (GI bleed)   Coagulopathy   Abdominal pain*      Plan:     EGD-risks (bleed, infection, perforation, untoward CV or resp. Event, mortality, etc.), benefits and alternatives were discussed with the patient who agrees to proceed. Wilford Corner, MD

## 2013-04-29 NOTE — Progress Notes (Signed)
Hospitalist Progress Note    Subjective::   Daily Progress Note: 04/29/2013 8:07AM    Pt admitted c/o melena and nose bleed with elevated INR. Also had acute blood loss anemia requiring transfusion and hb relatively stable since around 10. INR trending up.    Pt reports minimal nose bleed. No c/o melena this am.  C/o lower abd/suprapubic discomfort. INR 4.1    Current Facility-Administered Medications   Medication Dose Route Frequency   ??? 0.9% sodium chloride infusion 250 mL  250 mL IntraVENous PRN   ??? DULoxetine (CYMBALTA) capsule 90 mg  90 mg Oral DAILY   ??? clonazePAM (KlonoPIN) tablet 1 mg  1 mg Oral TID   ??? QUEtiapine (SEROquel) tablet 300 mg  300 mg Oral QHS   ??? sodium chloride (NS) flush 5-10 mL  5-10 mL IntraVENous Q8H   ??? sodium chloride (NS) flush 5-10 mL  5-10 mL IntraVENous PRN   ??? HYDROmorphone (PF) (DILAUDID) injection 1 mg  1 mg IntraVENous Q4H PRN   ??? traMADol (ULTRAM) tablet 50 mg  50 mg Oral Q6H PRN   ??? HYDROcodone-acetaminophen (NORCO) 5-325 mg per tablet 1 Tab  1 Tab Oral Q4H PRN   ??? acetaminophen (TYLENOL) tablet 650 mg  650 mg Oral Q4H PRN   ??? metoclopramide HCl (REGLAN) injection 10 mg  10 mg IntraVENous Q4H PRN   ??? ondansetron (ZOFRAN) injection 4 mg  4 mg IntraVENous Q4H PRN   ??? naloxone (NARCAN) injection 0.4 mg  0.4 mg IntraVENous PRN   ??? 0.9% sodium chloride infusion 250 mL  250 mL IntraVENous PRN   ??? pantoprazole (PROTONIX) 40 mg in sodium chloride 0.9 % 10 mL injection  40 mg IntraVENous Q12H   ??? dextrose 5% and 0.9% NaCl infusion  75 mL/hr IntraVENous CONTINUOUS   ??? 0.9% sodium chloride infusion 250 mL  250 mL IntraVENous PRN   ??? lip protectant (BLISTEX) ointment   Topical PRN        Review of Systems  A comprehensive review of systems was negative except for that written in the HPI.    Objective:     BP 107/66    Pulse 81    Temp(Src) 97.9 ??F (36.6 ??C)    Resp 16    Ht 5\' 4"  (1.626 m)    Wt 63.05 kg (139 lb)    BMI 23.85 kg/m2      SpO2 95%    LMP 07/28/2004      O2 Device: Room air     Temp (24hrs), Avg:98 ??F (36.7 ??C), Min:96.7 ??F (35.9 ??C), Max:99.4 ??F (37.4 ??C)      03/20 0700 - 03/20 1859  In: 0   Out: 350 [Urine:350]  03/18 1900 - 03/20 0659  In: 1487 [P.O.:794; I.V.:693]  Out: 2950 [Urine:2950]    General appearance: alert, cooperative, no distress, appears stated age  Lungs: clear to auscultation bilaterally  Heart: regular rate and rhythm, S1, S2 normal  Abdomen: soft, non-tender. Bowel sounds normal. No masses,  no organomegaly  Extremities: extremities normal, atraumatic, no cyanosis or edema  Psych: A+OX3    Additional comments:I reviewed the patient's new clinical lab test results. .    Data Review    Recent Results (from the past 24 hour(s))   HGB & HCT    Collection Time     04/28/13 12:40 PM       Result Value Ref Range    HGB 10.7 (*) 11.7 - 15.4 g/dL  HCT 32.8 (*) 35.8 - 46.3 %   URINALYSIS W/ RFLX MICROSCOPIC    Collection Time     04/28/13  9:09 PM       Result Value Ref Range    Color YELLOW      Appearance CLEAR      Specific gravity 1.011  1.001 - 1.023      pH (UA) 6.5  5.0 - 9.0      Protein NEGATIVE   NEG mg/dL    Glucose NEGATIVE       Ketone NEGATIVE   NEG mg/dL    Bilirubin NEGATIVE   NEG      Blood SMALL (*) NEG      Urobilinogen 0.2  0.2 - 1.0 EU/dL    Nitrites NEGATIVE   NEG      Leukocyte Esterase NEGATIVE   NEG      WBC 0-3  0 /hpf    RBC 10-20  0 /hpf    Epithelial cells 0-3  0 /hpf    Bacteria 0  0 /hpf    Casts 0-3  0 /lpf   HGB & HCT    Collection Time     04/29/13  1:00 AM       Result Value Ref Range    HGB 10.3 (*) 11.7 - 15.4 g/dL    HCT 16.1 (*) 09.6 - 46.3 %   PROTHROMBIN TIME + INR    Collection Time     04/29/13  6:31 AM       Result Value Ref Range    Prothrombin time 44.9 (*) 9.6 - 12.0 sec    INR 4.1 (*) 0.9 - 1.2           Assessment/Plan:     Principal Problem:    Coagulopathy (04/26/2013)    Active Problems:    Pulmonary embolism, 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  Hb ok after transfusion.  INR up despite no coumadin.  Prior FFP's - may have inhibitor. Will give additional FFP before procedure and dose of vit K.  For EGD today.    Care Plan discussed with: Patient/Family    Total time spent with patient: 20 minutes.

## 2013-04-29 NOTE — Progress Notes (Signed)
Patient back on floor, assessed and no changes to prior assessment- waiting for her to be put back in the system and be able to give pain meds

## 2013-04-29 NOTE — Progress Notes (Signed)
2.5 mg phenytoin given and 1st unit FFP started

## 2013-04-30 LAB — PROTHROMBIN TIME + INR
INR: 2 — ABNORMAL HIGH (ref 0.9–1.2)
Prothrombin time: 21.2 s — ABNORMAL HIGH (ref 9.6–12.0)

## 2013-04-30 LAB — PLASMA, ALLOCATE
Status of unit: TRANSFUSED
Status of unit: TRANSFUSED
Unit division: 0

## 2013-04-30 LAB — HGB & HCT
HCT: 33.4 % — ABNORMAL LOW (ref 35.8–46.3)
HCT: 32.6 % — ABNORMAL LOW (ref 35.8–46.3)
HGB: 11 g/dL — ABNORMAL LOW (ref 11.7–15.4)
HGB: 10.8 g/dL — ABNORMAL LOW (ref 11.7–15.4)

## 2013-04-30 MED ORDER — DIATRIZOATE MEGLUMINE & SODIUM 66 %-10 % ORAL SOLN
66-10 % | Freq: Once | ORAL | Status: AC
Start: 2013-04-30 — End: 2013-04-30
  Administered 2013-05-01: via ORAL

## 2013-04-30 MED ADMIN — dextrose 5% and 0.9% NaCl infusion: INTRAVENOUS | @ 09:00:00 | NDC 00409794109

## 2013-04-30 MED ADMIN — HYDROmorphone (PF) (DILAUDID) injection 1 mg: INTRAVENOUS | @ 09:00:00 | NDC 00409255201

## 2013-04-30 MED ADMIN — ondansetron (ZOFRAN) injection 4 mg: INTRAVENOUS | @ 13:00:00 | NDC 23155037831

## 2013-04-30 MED ADMIN — ondansetron (ZOFRAN) injection 4 mg: INTRAVENOUS | @ 09:00:00 | NDC 67457044000

## 2013-04-30 MED ADMIN — ondansetron (ZOFRAN) injection 4 mg: INTRAVENOUS | @ 17:00:00 | NDC 00781301072

## 2013-04-30 MED ADMIN — HYDROmorphone (PF) (DILAUDID) injection 1 mg: INTRAVENOUS | @ 21:00:00 | NDC 00409255201

## 2013-04-30 MED ADMIN — ondansetron (ZOFRAN) injection 4 mg: INTRAVENOUS | NDC 67457044000

## 2013-04-30 MED ADMIN — ondansetron (ZOFRAN) injection 4 mg: INTRAVENOUS | @ 05:00:00 | NDC 67457044000

## 2013-04-30 MED ADMIN — diphenhydrAMINE (BENADRYL) capsule 50 mg: ORAL | @ 13:00:00 | NDC 00904530661

## 2013-04-30 MED ADMIN — DULoxetine (CYMBALTA) capsule 90 mg: ORAL | @ 13:00:00 | NDC 68001025704

## 2013-04-30 MED ADMIN — predniSONE (DELTASONE) tablet 50 mg: ORAL | NDC 00054001820

## 2013-04-30 MED ADMIN — predniSONE (DELTASONE) tablet 50 mg: ORAL | @ 08:00:00 | NDC 00054001820

## 2013-04-30 MED ADMIN — HYDROmorphone (PF) (DILAUDID) injection 1 mg: INTRAVENOUS | @ 17:00:00 | NDC 00409255201

## 2013-04-30 MED ADMIN — HYDROmorphone (PF) (DILAUDID) injection 1 mg: INTRAVENOUS | NDC 00409255201

## 2013-04-30 MED ADMIN — clonazePAM (KlonoPIN) tablet 1 mg: ORAL | @ 13:00:00 | NDC 00904610261

## 2013-04-30 MED ADMIN — HYDROmorphone (PF) (DILAUDID) injection 1 mg: INTRAVENOUS | @ 13:00:00 | NDC 00409255201

## 2013-04-30 MED ADMIN — pantoprazole (PROTONIX) 40 mg in sodium chloride 0.9 % 10 mL injection: INTRAVENOUS | @ 13:00:00 | NDC 00409488810

## 2013-04-30 MED ADMIN — QUEtiapine (SEROquel) tablet 300 mg: ORAL | @ 02:00:00 | NDC 68084053211

## 2013-04-30 MED ADMIN — pantoprazole (PROTONIX) 40 mg in sodium chloride 0.9 % 10 mL injection: INTRAVENOUS | NDC 00409488810

## 2013-04-30 MED ADMIN — clonazePAM (KlonoPIN) tablet 1 mg: ORAL | @ 20:00:00 | NDC 00904610261

## 2013-04-30 MED ADMIN — predniSONE (DELTASONE) tablet 50 mg: ORAL | @ 13:00:00 | NDC 00054001820

## 2013-04-30 MED ADMIN — heparin (porcine) pf 300 Units: @ 23:00:00

## 2013-04-30 MED ADMIN — ondansetron (ZOFRAN) injection 4 mg: INTRAVENOUS | @ 22:00:00 | NDC 67457044000

## 2013-04-30 MED ADMIN — clonazePAM (KlonoPIN) tablet 1 mg: ORAL | @ 02:00:00 | NDC 00904610261

## 2013-04-30 MED ADMIN — HYDROmorphone (PF) (DILAUDID) injection 1 mg: INTRAVENOUS | @ 05:00:00 | NDC 00409255201

## 2013-04-30 MED FILL — ONDANSETRON (PF) 4 MG/2 ML INJECTION: 4 mg/2 mL | INTRAMUSCULAR | Qty: 2

## 2013-04-30 MED FILL — QUETIAPINE 100 MG TAB: 100 mg | ORAL | Qty: 3

## 2013-04-30 MED FILL — DULOXETINE 60 MG CAP, DELAYED RELEASE: 60 mg | ORAL | Qty: 1

## 2013-04-30 MED FILL — HYDROMORPHONE (PF) 1 MG/ML IJ SOLN: 1 mg/mL | INTRAMUSCULAR | Qty: 1

## 2013-04-30 MED FILL — CLONAZEPAM 1 MG TAB: 1 mg | ORAL | Qty: 1

## 2013-04-30 MED FILL — MONOJECT PREFILL ADVANCED (PF) 100 UNIT/ML INTRAVENOUS SYRINGE: 100 unit/mL | INTRAVENOUS | Qty: 3

## 2013-04-30 MED FILL — PREDNISONE 20 MG TAB: 20 mg | ORAL | Qty: 2

## 2013-04-30 MED FILL — SODIUM CHLORIDE 0.9 % INJECTION: INTRAMUSCULAR | Qty: 10

## 2013-04-30 MED FILL — DIPHENHYDRAMINE 25 MG CAP: 25 mg | ORAL | Qty: 2

## 2013-04-30 NOTE — Progress Notes (Signed)
Assessment complete via flow sheet. Patient alert and oriented X 4. Respirations even and unlabored. Abd soft and tender. Patient reports abd pain. Has good appetite. Instructed to call with needs. Bed low/locked with call light with in reach.

## 2013-04-30 NOTE — Progress Notes (Signed)
Dilaudid given slow IVP for abd pain rated at a 9/10. Zofran given for nausea. See MAR.

## 2013-04-30 NOTE — Progress Notes (Signed)
Hospitalist Progress Note    Subjective::   Daily Progress Note: 04/30/2013 5PM    Pt admitted c/o melena and nose bleed with elevated INR. Also, had acute blood loss anemia requiring transfusion and hb relatively stable since around 10. S/p EGD which showed multiple antral ulcers. Pt c/o abd pain but CT abd negative for acute issues.  She has h/o chronic abd pain.     Pt today c/o epigastric pain. No nausea or vomiting. Wants diet advanced.  INR 2.0 with goal of 3-3.5    Current Facility-Administered Medications   Medication Dose Route Frequency   ??? warfarin (COUMADIN) tablet 5 mg  5 mg Oral QHS   ??? clonazePAM (KlonoPIN) tablet 1 mg  1 mg Oral TID   ??? DULoxetine (CYMBALTA) capsule 90 mg  90 mg Oral DAILY   ??? pantoprazole (PROTONIX) 40 mg in sodium chloride 0.9 % 10 mL injection  40 mg IntraVENous Q12H   ??? QUEtiapine (SEROquel) tablet 300 mg  300 mg Oral QHS   ??? sodium chloride (NS) flush 5-10 mL  5-10 mL IntraVENous Q8H   ??? 0.9% sodium chloride infusion 250 mL  250 mL IntraVENous PRN   ??? 0.9% sodium chloride infusion 250 mL  250 mL IntraVENous PRN   ??? acetaminophen (TYLENOL) tablet 650 mg  650 mg Oral Q4H PRN   ??? HYDROcodone-acetaminophen (NORCO) 5-325 mg per tablet 1 Tab  1 Tab Oral Q4H PRN   ??? HYDROmorphone (PF) (DILAUDID) injection 1 mg  1 mg IntraVENous Q4H PRN   ??? lip protectant (BLISTEX) ointment   Topical PRN   ??? metoclopramide HCl (REGLAN) injection 10 mg  10 mg IntraVENous Q4H PRN   ??? naloxone (NARCAN) injection 0.4 mg  0.4 mg IntraVENous PRN   ??? ondansetron (ZOFRAN) injection 4 mg  4 mg IntraVENous Q4H PRN   ??? sodium chloride (NS) flush 5-10 mL  5-10 mL IntraVENous PRN   ??? traMADol (ULTRAM) tablet 50 mg  50 mg Oral Q6H PRN        Review of Systems  A comprehensive review of systems was negative except for that written in the HPI.    Objective:     BP 149/83    Pulse 98    Temp(Src) 96.7 ??F (35.9 ??C)    Resp 16    Ht 5\' 4"  (1.626 m)    Wt 63.05 kg (139 lb)    BMI 23.85 kg/m2      SpO2 93%    LMP 07/28/2004       O2 Device: Room air    Temp (24hrs), Avg:97.6 ??F (36.4 ??C), Min:96.3 ??F (35.7 ??C), Max:98.9 ??F (37.2 ??C)         03/19 1900 - 03/21 6045  In: 1649 [P.O.:720; I.V.:929]  Out: 3300 [Urine:3300]    General appearance: alert, cooperative, no distress, appears stated age  Lungs: clear to auscultation bilaterally  Heart: regular rate and rhythm, S1, S2 normal  Abdomen: soft, non-tender. Bowel sounds normal. No masses,  no organomegaly  Extremities: extremities normal, atraumatic, no cyanosis or edema  Psych: A+OX3    Additional comments:I reviewed the patient's new clinical lab test results. .    Data Review    Recent Results (from the past 24 hour(s))   HGB & HCT    Collection Time     04/30/13  1:00 AM       Result Value Ref Range    HGB 11.0 (*) 11.7 - 15.4 g/dL  HCT 33.4 (*) 35.8 - 46.3 %   PROTHROMBIN TIME + INR    Collection Time     04/30/13  1:00 AM       Result Value Ref Range    Prothrombin time 21.2 (*) 9.6 - 12.0 sec    INR 2.0 (*) 0.9 - 1.2     HGB & HCT    Collection Time     04/30/13  1:15 PM       Result Value Ref Range    HGB 10.8 (*) 11.7 - 15.4 g/dL    HCT 11.932.6 (*) 14.735.8 - 46.3 %         Assessment/Plan:     Principal Problem:    Coagulopathy (04/26/2013)    Active Problems:    Pulmonary embolism, 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  Hb ok after transfusion.  INR kept on going up despite no coumadin for days.  Need to resume coumadin tonight and monitor trend after vit K and FFP's. Advance diet.     Care Plan discussed with: Patient/Family    Total time spent with patient: 20 minutes.

## 2013-04-30 NOTE — Progress Notes (Signed)
Dilaudid 1mg  given SIVP for c/o abdominal pain rated 8/10. Zofran 4mg  given SIVP for c/o nausea.

## 2013-04-30 NOTE — Progress Notes (Signed)
Dilaudid given slow IVP for abd pain rated at a 7/10. Zofran given slow IVP for nausea. See MAR.

## 2013-04-30 NOTE — Procedures (Signed)
[facility]                            [address]                            [place] [address]                                [phone]                                 PROCEDURE NOTE    NAME:  [name], Krista Lopez                           MR:  [id number]  LOC:  ENDOENDOPL            SEX:  F                 ACCT:  [id number]  DOB:  07/09/1965            AGE:  47                PT:  S  ADMIT:  04/29/2013          DSCH:  04/29/2013       MSV:        DATE OF PROCEDURE: 04/29/2013    NAME OF PROCEDURE: Esophagogastroduodenoscopy.    SUBJECTIVE: A 47-year-old female is undergoing upper endoscopy because of  melena (in the setting of anticoagulation for factor V Leiden and factor  II deficiency). Upper endoscopy to document the presence or absence of  upper tract source for the above. The patient still has a significant  coagulopathy, and biopsies are not being planned. Risks (bleeding,  perforation, infection, untoward cardiovascular or respiratory  mortality), benefits, and alternatives were discussed in detail with the  patient, who agrees to proceed.    ANESTHESIA: As per MAC anesthesia.    INSTRUMENT: GIF-Q180 videoscope.    PROCEDURE: The videoscope was passed through the upper pharynx with  minimal difficulty into the esophagus. Proximal, mid, and distal  esophagus were unremarkable except for a hiatal hernia sac. Once past the  hernia sac, the remaining body of the stomach was unremarkable. However,  in the antrum along the lesser curve, posterior wall, 4 ulcers were seen.  None of these ulcers had a visible vessel and there was no bleeding from  the ulcer. In the anterior wall, there was a small erosion present. A  retroflex view of the angularis was normal. Retroflex view of the EG  junction and cardia ________. Attention was turned to the duodenum.  Duodenal bulb and C-loop were normal. No active ulcer crater was seen.  The endoscope was brought back to  the stomach where photographs were  obtained. Because of no visible vessels or active bleeding, therapeutic  intervention was not performed. The endoscope ________ stomach,  ________ esophagus. The vocal cords ________ the recovery area in stable  condition.    IMPRESSION:  1. Multiple antral ulcers as described above, probable source of melena.  2. Hiatal hernia sac.    PLAN:  DIAGNOSTIC:  1. Check H pylori titers.  2. Follow hemoglobin and hematocrit.  THERAPEUTIC:  1. Correction   of coagulopathy per primary team.  2. IV PPI therapy.  3. Long-term PPI therapy given long-term need for anticoagulation.  4. Followup EGD in approximately 8 weeks to document healing.                  Rakeem Colley I Numa Heatwole, MD                This is an unverified document unless signed by physician.    TID:  [id number]              DIC ID:  [id number]         DT:  04/30/2013 08:43 A  JOB:  [id number]           DOC#:  [id number]           DD:  04/29/2013    cc:   Lopa S. Bhansaly, DO        Sharise Lippy I Thomson Herbers, MD

## 2013-04-30 NOTE — Progress Notes (Signed)
Pharmacy to dose Warfarin:    Lab Results   Component Value Date/Time    INR 2.0 04/30/2013  1:00 AM    HGB 11.0 04/30/2013  1:00 AM     Please administer Warfarin 5 mg tonight.  I will continue to monitor daily.    Arta SilenceKevin E Reighn Kaplan, Pharm D  Clinical Pharmacist

## 2013-05-01 LAB — PROTHROMBIN TIME + INR
INR: 2.4 — ABNORMAL HIGH (ref 0.9–1.2)
Prothrombin time: 26.3 s — ABNORMAL HIGH (ref 9.6–12.0)

## 2013-05-01 LAB — HGB & HCT
HCT: 30.7 % — ABNORMAL LOW (ref 35.8–46.3)
HCT: 32.3 % — ABNORMAL LOW (ref 35.8–46.3)
HGB: 9.8 g/dL — ABNORMAL LOW (ref 11.7–15.4)
HGB: 10.2 g/dL — ABNORMAL LOW (ref 11.7–15.4)

## 2013-05-01 MED ADMIN — HYDROmorphone (PF) (DILAUDID) injection 1 mg: INTRAVENOUS | @ 10:00:00 | NDC 00409255201

## 2013-05-01 MED ADMIN — clonazePAM (KlonoPIN) tablet 1 mg: ORAL | @ 02:00:00 | NDC 00904610261

## 2013-05-01 MED ADMIN — ondansetron (ZOFRAN) injection 4 mg: INTRAVENOUS | @ 10:00:00 | NDC 67457044000

## 2013-05-01 MED ADMIN — HYDROmorphone (PF) (DILAUDID) injection 1 mg: INTRAVENOUS | @ 06:00:00 | NDC 00409255201

## 2013-05-01 MED ADMIN — clonazePAM (KlonoPIN) tablet 1 mg: ORAL | @ 13:00:00 | NDC 00904610261

## 2013-05-01 MED ADMIN — pantoprazole (PROTONIX) 40 mg in sodium chloride 0.9 % 10 mL injection: INTRAVENOUS | @ 13:00:00 | NDC 00409488810

## 2013-05-01 MED ADMIN — heparin (porcine) pf 300 Units: @ 13:00:00

## 2013-05-01 MED ADMIN — HYDROmorphone (PF) (DILAUDID) injection 1 mg: INTRAVENOUS | @ 19:00:00 | NDC 00409255201

## 2013-05-01 MED ADMIN — warfarin (COUMADIN) tablet 5 mg: ORAL | @ 02:00:00 | NDC 62584099411

## 2013-05-01 MED ADMIN — QUEtiapine (SEROquel) tablet 300 mg: ORAL | @ 02:00:00 | NDC 68084053211

## 2013-05-01 MED ADMIN — HYDROmorphone (PF) (DILAUDID) injection 1 mg: INTRAVENOUS | @ 14:00:00 | NDC 00409255201

## 2013-05-01 MED ADMIN — clonazePAM (KlonoPIN) tablet 1 mg: ORAL | @ 20:00:00 | NDC 72888015330

## 2013-05-01 MED ADMIN — HYDROcodone-acetaminophen (NORCO) 5-325 mg per tablet 1 Tab: ORAL | @ 23:00:00 | NDC 68084089511

## 2013-05-01 MED ADMIN — DULoxetine (CYMBALTA) capsule 90 mg: ORAL | @ 13:00:00 | NDC 68001025704

## 2013-05-01 MED ADMIN — ondansetron (ZOFRAN) injection 4 mg: INTRAVENOUS | @ 06:00:00 | NDC 67457044000

## 2013-05-01 MED ADMIN — clonazePAM (KlonoPIN) tablet 1 mg: ORAL | @ 19:00:00 | NDC 00904610261

## 2013-05-01 MED ADMIN — pantoprazole (PROTONIX) tablet 40 mg: ORAL | @ 20:00:00 | NDC 68084081311

## 2013-05-01 MED ADMIN — HYDROmorphone (PF) (DILAUDID) injection 1 mg: INTRAVENOUS | @ 01:00:00 | NDC 00409255201

## 2013-05-01 MED ADMIN — pantoprazole (PROTONIX) 40 mg in sodium chloride 0.9 % 10 mL injection: INTRAVENOUS | @ 02:00:00 | NDC 00409488810

## 2013-05-01 MED ADMIN — ondansetron (ZOFRAN) injection 4 mg: INTRAVENOUS | @ 14:00:00 | NDC 67457044000

## 2013-05-01 MED ADMIN — ondansetron (ZOFRAN) injection 4 mg: INTRAVENOUS | @ 01:00:00 | NDC 67457044000

## 2013-05-01 MED FILL — WARFARIN 5 MG TAB: 5 mg | ORAL | Qty: 1

## 2013-05-01 MED FILL — HYDROMORPHONE (PF) 1 MG/ML IJ SOLN: 1 mg/mL | INTRAMUSCULAR | Qty: 1

## 2013-05-01 MED FILL — QUETIAPINE 100 MG TAB: 100 mg | ORAL | Qty: 3

## 2013-05-01 MED FILL — ONDANSETRON (PF) 4 MG/2 ML INJECTION: 4 mg/2 mL | INTRAMUSCULAR | Qty: 2

## 2013-05-01 MED FILL — SODIUM CHLORIDE 0.9 % INJECTION: INTRAMUSCULAR | Qty: 10

## 2013-05-01 MED FILL — HYDROCODONE-ACETAMINOPHEN 5 MG-325 MG TAB: 5-325 mg | ORAL | Qty: 1

## 2013-05-01 MED FILL — CLONAZEPAM 1 MG TAB: 1 mg | ORAL | Qty: 1

## 2013-05-01 MED FILL — DULOXETINE 60 MG CAP, DELAYED RELEASE: 60 mg | ORAL | Qty: 1

## 2013-05-01 MED FILL — PANTOPRAZOLE 40 MG TAB, DELAYED RELEASE: 40 mg | ORAL | Qty: 1

## 2013-05-01 NOTE — Progress Notes (Signed)
Dan from radiology called and states that patients US will be done in the morning.

## 2013-05-01 NOTE — Progress Notes (Signed)
Assessment complete via flow sheet. Patient alert and oriented X 4. Respirations even and unlabored. Patient has a right port. Abd soft and tender. Patient reports abd pain rated at a 8/10. Patient has just been medicated by previous shift. Encourage patient to give pain medication 1 hour to see results. Instructed to call with needs. Patient verbalizes understanding. Bed low/locked with call light with in reach.

## 2013-05-01 NOTE — Progress Notes (Signed)
Dilaudid 1 mg given IV for pain and Zofran 4 mg given IV for nausea. Pt positioned for comfort.

## 2013-05-01 NOTE — Progress Notes (Signed)
Oxycodone given PO for abd pain rated at a 10/10. See MAR.

## 2013-05-01 NOTE — Progress Notes (Signed)
Pt resting in bed with no distress noted.

## 2013-05-01 NOTE — Progress Notes (Signed)
Pt awake, alert and oriented x 4. Respirations even and unlabored. Pt rates her pain at 6/10/ states that she usually has some pain all the time. Stated to the pt that her pain medications can be given again around 0930.

## 2013-05-01 NOTE — Progress Notes (Signed)
Pharmacy to dose Warfarin:    Lab Results   Component Value Date/Time    INR 2.4 05/01/2013  4:00 AM    HGB 9.8 05/01/2013 12:20 AM     Will continue with Warfarin 5 mg every 24 hours.    Artemio AlyKevin E Nancylee Gaines, Pharm d  Clinical Pharmacist

## 2013-05-01 NOTE — Progress Notes (Signed)
Patient in pain despite treatment. Dr Everlene FarrierNatarajan notified. New orders received.

## 2013-05-01 NOTE — Progress Notes (Signed)
Dilaudid given slow IVP for abd pain rated at a 7/10. Zofran given slow IVP for nausea. See MAR.

## 2013-05-01 NOTE — Progress Notes (Signed)
Norco given PO for abd pain rated at a 9/10 per Dr. Everlene FarrierNatarajan. See MAR.

## 2013-05-01 NOTE — Progress Notes (Addendum)
Hospitalist Progress Note    Subjective:   Daily Progress Note: 05/01/2013 2:00 PM    Krista Lopez is a 48 y.o. 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 Pt's INR in the ER was noted to be undetectable. Her Hgb was 12.1 on 04/14/13 and on presentation had dropped to 9.6. Pt admitted for further management. During her hospital stay, she has been treated with PO Vit K and FFP. GI was consulted and pt underwent EGD on 3/20. Pt found to have multiple antral ulcers. GI recommends BID PPI x 8 weeks, then daily. Pt c/o abd pain but CT abd/pelvis negative for acute issues. She has h/o chronic abd pain.     Patient seen and examined.  Nursing notes and chart reviewed.  No specific nursing issues. Pt continues to request frequent pain medications. Pt denies HA. She complains of R groin pain. She denies N/V, SOB or CP. She is tolerating PO intake and is on regular diet.      Current Facility-Administered Medications   Medication Dose Route Frequency   ??? pantoprazole (PROTONIX) tablet 40 mg  40 mg Oral ACB&D   ??? warfarin (COUMADIN) tablet 5 mg  5 mg Oral QHS   ??? heparin (porcine) pf 300 Units  300 Units InterCATHeter PRN   ??? clonazePAM (KlonoPIN) tablet 1 mg  1 mg Oral TID   ??? DULoxetine (CYMBALTA) capsule 90 mg  90 mg Oral DAILY   ??? QUEtiapine (SEROquel) tablet 300 mg  300 mg Oral QHS   ??? sodium chloride (NS) flush 5-10 mL  5-10 mL IntraVENous Q8H   ??? 0.9% sodium chloride infusion 250 mL  250 mL IntraVENous PRN   ??? 0.9% sodium chloride infusion 250 mL  250 mL IntraVENous PRN   ??? acetaminophen (TYLENOL) tablet 650 mg  650 mg Oral Q4H PRN   ??? HYDROcodone-acetaminophen (NORCO) 5-325 mg per tablet 1 Tab  1 Tab Oral Q4H PRN   ??? lip protectant (BLISTEX) ointment   Topical PRN   ???  metoclopramide HCl (REGLAN) injection 10 mg  10 mg IntraVENous Q4H PRN   ??? naloxone (NARCAN) injection 0.4 mg  0.4 mg IntraVENous PRN   ??? ondansetron (ZOFRAN) injection 4 mg  4 mg IntraVENous Q4H PRN   ??? sodium chloride (NS) flush 5-10 mL  5-10 mL IntraVENous PRN   ??? traMADol (ULTRAM) tablet 50 mg  50 mg Oral Q6H PRN        Review of Systems  Pertinent items are noted in HPI.    Objective:     BP 116/64    Pulse 78    Temp(Src) 98.6 ??F (37 ??C)    Resp 18    Ht 5\' 4"  (1.626 m)    Wt 63.05 kg (139 lb)    BMI 23.85 kg/m2      SpO2 94%    LMP 07/28/2004      O2 Device: Room air    Temp (24hrs), Avg:98.2 ??F (36.8 ??C), Min:96.7 ??F (35.9 ??C), Max:99.3 ??F (37.4 ??C)      03/22 0700 - 03/22 1859  In: 360 [P.O.:360]  Out: -   03/20 1900 - 03/22 0659  In: 3230 [P.O.:1680; I.V.:1550]  Out: 4250 [Urine:4250]    BP 116/64    Pulse 78    Temp(Src) 98.6 ??  F (37 ??C)    Resp 18    Ht 5\' 4"  (1.626 m)    Wt 63.05 kg (139 lb)    BMI 23.85 kg/m2      SpO2 94%    LMP 07/28/2004     General:  Alert, cooperative, no distress, appears stated age.   Eyes:  Conjunctivae/sclera normal. No scleral icterus. EOMs intact.    Lungs:   Clear to auscultation bilaterally.   Heart:  Regular rate and rhythm, S1, S2 normal, no murmur, click, rub or gallop.   Abdomen:   Soft. Bowel sounds normal. No masses,  No organomegaly. R lower groin with TTP.   Extremities: Extremities normal, atraumatic, no cyanosis or edema.   Pulses: 2+ and symmetric all extremities.   Skin: Skin color, texture, turgor normal. No rashes or lesions.   Neurologic: Grossly normal.       Additional comments:  I reviewed the patient's new clinical lab test results and the patient's new imaging test results.     Data Review    Recent Results (from the past 24 hour(s))   HGB & HCT    Collection Time     05/01/13 12:20 AM       Result Value Ref Range    HGB 9.8 (*) 11.7 - 15.4 g/dL    HCT 82.9 (*) 56.2 - 46.3 %   PROTHROMBIN TIME + INR    Collection Time     05/01/13  4:00 AM       Result  Value Ref Range    Prothrombin time 26.3 (*) 9.6 - 12.0 sec    INR 2.4 (*) 0.9 - 1.2     HGB & HCT    Collection Time     05/01/13 12:30 PM       Result Value Ref Range    HGB 10.2 (*) 11.7 - 15.4 g/dL    HCT 13.0 (*) 86.5 - 46.3 %     CT head noncontrast 3/17:  FINDINGS: The brain appears normal. Exam is negative for acute hemorrhage,  mass, or mass effect. No definite CT evidence of acute major vascular territory  infarct.  Ventricles normal size and contour. No abnormal intracranial fluid collections.  Bony calvarium intact. Mastoid air cells and paranasal sinuses clear where  visualized.  IMPRESSION: Negative noncontrast CT brain.    CT abd/pelvis noncontrast 3/21:  FINDINGS: Evaluation of the hollow and solid viscera is limited by the lack of  intravenous contrast.  The lung bases are clear.  CT abdomen: The IVC filter is in place. No gallstones evident. Two  nonobstructive right parapelvic renal calculi appreciated. The left kidney and  ureter are unremarkable. A small amount of oral contrast is appreciated in the  ileum. No free intraperitoneal air evident to suggest perforation of an ulcer.  CT pelvis: The uterus and adnexa are unremarkable. The bladder is unremarkable.  No inguinal hernia or adenopathy. The rectum and sigmoid colon are full of  stool. No free fluid collection in the pelvis.  No aggressive osseous lesions identified.  IMPRESSION:  1. No definite acute abdominal or pelvic abnormality noted on this limited exam.  2. Two nonobstructive right parapelvic renal stones. There is no hydronephrosis  or hydroureter.    EGD 3/20:  PROCEDURE: The videoscope was passed through the upper pharynx with  minimal difficulty into the esophagus. Proximal, mid, and distal  esophagus were unremarkable except for a hiatal hernia sac. Once past the  hernia sac, the remaining  body of the stomach was unremarkable. However,  in the antrum along the lesser curve, posterior wall, 4 ulcers were seen.  None of these  ulcers had a visible vessel and there was no bleeding from  the ulcer. In the anterior wall, there was a small erosion present. A  retroflex view of the angularis was normal. Retroflex view of the EG  junction and cardia ________. Attention was turned to the duodenum.  Duodenal bulb and C-loop were normal. No active ulcer crater was seen.  The endoscope was brought back to the stomach where photographs were  obtained. Because of no visible vessels or active bleeding, therapeutic  intervention was not performed. The endoscope ________ stomach,  ________ esophagus. The vocal cords ________ the recovery area in stable  condition.  IMPRESSION:  1. Multiple antral ulcers as described above, probable source of melena.  2. Hiatal hernia sac.    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 Given PO Vit K and FFP during hospital stay. She has been resumed on coumadin and Pharmacy assisting with management. Will also consult Hematology in AM as her INR has been difficult to control.  - Migraine: Chronic. Her chart indicates history of narcotic seeking. Now resolved.  - Recurrent PE/Factor V Leiden mutation/Factor II deficiency: Pt on chronic coumadin and is managed by Dr. Anner Crete as outpt. Pt reports that her goal INR is 3.0-3.5. Will consult Hematology in AM as pt's INR has been difficult to control.  - Depression/Anxiety/Bipolar disorder: Chronic. Continue her usual outpt regimen of Seroquel, Cymbalta and Klonopin.   - Fe deficiency anemia: Pt transfused PRBC during hospital stay. Hgb/Hct has remained stable following transfusion.  - Epistaxis: Resolved.   - Melena: Pt had ongoing melena following admission. GI was consulted and pt underwent EGD on 3/20. She was found to have multiple antral ulcers. GI recommends BID PPI x 8 weeks, then daily. H. Pylori results pending.  - ABLA: Transfused 2 units PRBC during hospital stay. Pt's Hgb/Hct has remained stable since.  - Abdominal pain: CT abd/pelvis with no acute findings aside from 2 nonobstructive right parapelvic renal stones. No e/o hydronephrosis or hydroureter. Will obtain pelvic U/S.        Care Plan discussed with: Patient/Family and Nurse    Total time spent with patient: 20 minutes.    Darien Ramus, MD  05/01/2013  2:00 PM

## 2013-05-01 NOTE — Progress Notes (Signed)
Pt was given Dilaudid 1 mg IV for pain and Zofran 4 mg given IV for nausea. Pt is positioned for comfort, call light in easy reach, reminded pt to call for assistance.

## 2013-05-01 NOTE — Progress Notes (Addendum)
Report given to oncoming RN.  Pt is resting in bed without distress noted.

## 2013-05-01 NOTE — Progress Notes (Signed)
Pt resting in bed without distress noted or c/o voiced.

## 2013-05-01 NOTE — Progress Notes (Signed)
Called Dr. Everlene FarrierNatarajan regarding patients persistent pain. New orders received.

## 2013-05-01 NOTE — Progress Notes (Signed)
Dilaudid given slow IVP for abd pain rated at a 8/10. Zofran given slow IVP for nausea. See MAR.

## 2013-05-02 LAB — HGB & HCT
HCT: 31.3 % — ABNORMAL LOW (ref 35.8–46.3)
HGB: 9.8 g/dL — ABNORMAL LOW (ref 11.7–15.4)

## 2013-05-02 LAB — PROTHROMBIN TIME + INR
INR: 2 — ABNORMAL HIGH (ref 0.9–1.2)
Prothrombin time: 20.9 s — ABNORMAL HIGH (ref 9.6–12.0)

## 2013-05-02 MED ADMIN — pantoprazole (PROTONIX) tablet 40 mg: ORAL | @ 12:00:00 | NDC 68084081311

## 2013-05-02 MED ADMIN — oxyCODONE IR (OXY-IR) immediate release tablet 15 mg: ORAL | @ 16:00:00 | NDC 68084018411

## 2013-05-02 MED ADMIN — warfarin (COUMADIN) tablet 5 mg: ORAL | NDC 62584099411

## 2013-05-02 MED ADMIN — clonazePAM (KlonoPIN) tablet 1 mg: ORAL | @ 13:00:00 | NDC 00904610261

## 2013-05-02 MED ADMIN — enoxaparin (LOVENOX) injection 60 mg: SUBCUTANEOUS | @ 16:00:00 | NDC 00075801601

## 2013-05-02 MED ADMIN — oxyCODONE IR (OXY-IR) immediate release tablet 15 mg: ORAL | @ 11:00:00 | NDC 68084018411

## 2013-05-02 MED ADMIN — oxyCODONE IR (OXY-IR) immediate release tablet 15 mg: ORAL | @ 03:00:00 | NDC 68084018411

## 2013-05-02 MED ADMIN — DULoxetine (CYMBALTA) capsule 90 mg: ORAL | @ 13:00:00 | NDC 68084069211

## 2013-05-02 MED ADMIN — QUEtiapine (SEROquel) tablet 300 mg: ORAL | @ 02:00:00 | NDC 68084053211

## 2013-05-02 MED ADMIN — clonazePAM (KlonoPIN) tablet 1 mg: ORAL | @ 02:00:00 | NDC 00904610261

## 2013-05-02 MED ADMIN — heparin (porcine) pf 300 Units: @ 16:00:00

## 2013-05-02 MED ADMIN — oxyCODONE IR (OXY-IR) immediate release tablet 15 mg: ORAL | @ 20:00:00 | NDC 68084018411

## 2013-05-02 MED ADMIN — pantoprazole (PROTONIX) tablet 40 mg: ORAL | @ 20:00:00 | NDC 68084081311

## 2013-05-02 MED ADMIN — ondansetron (ZOFRAN) injection 4 mg: INTRAVENOUS | @ 16:00:00 | NDC 67457044000

## 2013-05-02 MED ADMIN — oxyCODONE IR (OXY-IR) immediate release tablet 15 mg: ORAL | @ 07:00:00 | NDC 68084018411

## 2013-05-02 MED ADMIN — ondansetron (ZOFRAN) injection 4 mg: INTRAVENOUS | @ 20:00:00 | NDC 67457044000

## 2013-05-02 MED ADMIN — clonazePAM (KlonoPIN) tablet 1 mg: ORAL | @ 20:00:00 | NDC 00904610261

## 2013-05-02 MED ADMIN — HYDROcodone-acetaminophen (NORCO) 10-325 mg tablet 1 Tab: ORAL | NDC 50268040211

## 2013-05-02 MED FILL — WARFARIN 5 MG TAB: 5 mg | ORAL | Qty: 1

## 2013-05-02 MED FILL — CLONAZEPAM 1 MG TAB: 1 mg | ORAL | Qty: 1

## 2013-05-02 MED FILL — OXYCODONE 15 MG TAB: 15 mg | ORAL | Qty: 1

## 2013-05-02 MED FILL — PANTOPRAZOLE 40 MG TAB, DELAYED RELEASE: 40 mg | ORAL | Qty: 1

## 2013-05-02 MED FILL — MONOJECT PREFILL ADVANCED (PF) 100 UNIT/ML INTRAVENOUS SYRINGE: 100 unit/mL | INTRAVENOUS | Qty: 3

## 2013-05-02 MED FILL — QUETIAPINE 100 MG TAB: 100 mg | ORAL | Qty: 3

## 2013-05-02 MED FILL — HYDROCODONE-ACETAMINOPHEN 10 MG-325 MG TAB: 10-325 mg | ORAL | Qty: 1

## 2013-05-02 MED FILL — ONDANSETRON (PF) 4 MG/2 ML INJECTION: 4 mg/2 mL | INTRAMUSCULAR | Qty: 2

## 2013-05-02 MED FILL — DULOXETINE 60 MG CAP, DELAYED RELEASE: 60 mg | ORAL | Qty: 1

## 2013-05-02 MED FILL — LOVENOX 60 MG/0.6 ML SUBCUTANEOUS SYRINGE: 60 mg/0.6 mL | SUBCUTANEOUS | Qty: 0.6

## 2013-05-02 NOTE — Progress Notes (Signed)
Report received from previous nurse, Nilda SimmerVeronica Lutz, RN, including pt's current status, vital signs and recent pain med given.

## 2013-05-02 NOTE — Progress Notes (Signed)
Pt c/o nausea.   Zofran 4mg given IV at this time.  Will continue to monitor.

## 2013-05-02 NOTE — Progress Notes (Signed)
On pain reassessment patient rates her pain at a 9/10. Appears very drowsy and falls asleep with RN in room drawing blood from port. Easily woken. Questions why the doctor wont just give her what works "Dilaudid". Dr. Everlene FarrierNatarajan notified. No new orders received.

## 2013-05-02 NOTE — Progress Notes (Signed)
Awoke easily. Inquired about pain medication with pt informed she may have in approx one hr; voiced understanding.  Right chest lifeport and dsg intact with good blood return, patent, flushing well, site clear. No distress noted.

## 2013-05-02 NOTE — Progress Notes (Signed)
Report given to Christi, RN

## 2013-05-02 NOTE — Progress Notes (Signed)
Oxycodone 15 mg given PO for c/o abdom and right groin pain, rated "9".

## 2013-05-02 NOTE — Progress Notes (Signed)
Pt assessment completed. Pt shows no s/s distress, breathing even and unlabored. HR regular. Abdomen soft with active bowel sounds. Pt is stating she is in pain, but has just been medicated by previous nurse. All other needs met at this time.

## 2013-05-02 NOTE — Progress Notes (Addendum)
Pt c/o pain 9/10 and nausea. See MAR for administration Oxy IR and Zofran SIVP.

## 2013-05-02 NOTE — Progress Notes (Signed)
Hospitalist Progress Note    Subjective:   Daily Progress Note: 05/02/2013 10:21 AM    Krista AgeeShannon Lopez is a 48 y.o. 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 Pt's INR in the ER was noted to be undetectable. Her Hgb was 12.1 on 04/14/13 and on presentation had dropped to 9.6. Pt admitted for further management. During her hospital stay, she has been treated with PO Vit K and FFP. GI was consulted and pt underwent EGD on 3/20. Pt found to have multiple antral ulcers. GI recommends BID PPI x 8 weeks, then daily. Pt c/o abd pain but CT abd/pelvis negative for acute issues. She has h/o chronic abd pain.     Patient seen and examined.  Nursing notes and chart reviewed.  No specific nursing issues. Pt continues to request frequent pain medications. Per nursing notes overnight, pt appeared very drowsy and was falling asleep while RN was in room. Pt questioned why the doctor won't just give her what works "Dilaudid". Pt continues to have R groin pain. She denies urinary sx. She denies SOB, CP, N/V or HA.    Current Facility-Administered Medications   Medication Dose Route Frequency   ??? pantoprazole (PROTONIX) tablet 40 mg  40 mg Oral ACB&D   ??? oxyCODONE IR (OXY-IR) immediate release tablet 15 mg  15 mg Oral Q4H PRN   ??? warfarin (COUMADIN) tablet 5 mg  5 mg Oral QHS   ??? heparin (porcine) pf 300 Units  300 Units InterCATHeter PRN   ??? clonazePAM (KlonoPIN) tablet 1 mg  1 mg Oral TID   ??? DULoxetine (CYMBALTA) capsule 90 mg  90 mg Oral DAILY   ??? QUEtiapine (SEROquel) tablet 300 mg  300 mg Oral QHS   ??? sodium chloride (NS) flush 5-10 mL  5-10 mL IntraVENous Q8H   ??? 0.9% sodium chloride infusion 250 mL  250 mL IntraVENous PRN   ??? 0.9% sodium chloride infusion 250 mL  250 mL IntraVENous PRN   ???  acetaminophen (TYLENOL) tablet 650 mg  650 mg Oral Q4H PRN   ??? lip protectant (BLISTEX) ointment   Topical PRN   ??? naloxone (NARCAN) injection 0.4 mg  0.4 mg IntraVENous PRN   ??? ondansetron (ZOFRAN) injection 4 mg  4 mg IntraVENous Q4H PRN   ??? sodium chloride (NS) flush 5-10 mL  5-10 mL IntraVENous PRN        Review of Systems  Pertinent items are noted in HPI.    Objective:     BP 126/63    Pulse 60    Temp(Src) 97.4 ??F (36.3 ??C)    Resp 17    Ht 5\' 4"  (1.626 m)    Wt 63.05 kg (139 lb)    BMI 23.85 kg/m2      SpO2 93%    LMP 07/28/2004      O2 Device: Room air    Temp (24hrs), Avg:98.2 ??F (36.8 ??C), Min:97.4 ??F (36.3 ??C), Max:98.6 ??F (37 ??C)         03/21 1900 - 03/23 0659  In: 360 [P.O.:360]  Out: 2500 [Urine:2500]    BP 126/63    Pulse 60    Temp(Src) 97.4 ??F (36.3 ??C)    Resp 17    Ht 5\' 4"  (1.626 m)    Wt 63.05  kg (139 lb)    BMI 23.85 kg/m2      SpO2 93%    LMP 07/28/2004     General:  Alert, cooperative, no distress, appears stated age.   Eyes:  Conjunctivae/sclera normal. No scleral icterus. EOMs intact.    Lungs:   Clear to auscultation bilaterally.   Heart:  Regular rate and rhythm, S1, S2 normal, no murmur, click, rub or gallop.   Abdomen:   Soft. Bowel sounds normal. TTP RLQ and R groin. No masses,  No organomegaly.   Extremities: Extremities normal, atraumatic, no cyanosis or edema.   Pulses: 2+ and symmetric all extremities.   Skin: Skin color, texture, turgor normal. No rashes or lesions.   Neurologic: Grossly normal.       Additional comments:  I reviewed the patient's new clinical lab test results and the patient's new imaging test results.     Data Review    Recent Results (from the past 24 hour(s))   HGB & HCT    Collection Time     05/01/13 12:30 PM       Result Value Ref Range    HGB 10.2 (*) 11.7 - 15.4 g/dL    HCT 16.1 (*) 09.6 - 46.3 %   HGB & HCT    Collection Time     05/02/13 12:00 AM       Result Value Ref Range    HGB 9.8 (*) 11.7 - 15.4 g/dL    HCT 04.5 (*) 40.9 - 46.3 %   PROTHROMBIN  TIME + INR    Collection Time     05/02/13  6:15 AM       Result Value Ref Range    Prothrombin time 20.9 (*) 9.6 - 12.0 sec    INR 2.0 (*) 0.9 - 1.2       CT head noncontrast 3/17:  FINDINGS: The brain appears normal. Exam is negative for acute hemorrhage,  mass, or mass effect. No definite CT evidence of acute major vascular territory  infarct.  Ventricles normal size and contour. No abnormal intracranial fluid collections.  Bony calvarium intact. Mastoid air cells and paranasal sinuses clear where  visualized.  IMPRESSION: Negative noncontrast CT brain.    CT abd/pelvis noncontrast 3/21:  FINDINGS: Evaluation of the hollow and solid viscera is limited by the lack of  intravenous contrast.  The lung bases are clear.  CT abdomen: The IVC filter is in place. No gallstones evident. Two  nonobstructive right parapelvic renal calculi appreciated. The left kidney and  ureter are unremarkable. A small amount of oral contrast is appreciated in the  ileum. No free intraperitoneal air evident to suggest perforation of an ulcer.  CT pelvis: The uterus and adnexa are unremarkable. The bladder is unremarkable.  No inguinal hernia or adenopathy. The rectum and sigmoid colon are full of  stool. No free fluid collection in the pelvis.  No aggressive osseous lesions identified.  IMPRESSION:  1. No definite acute abdominal or pelvic abnormality noted on this limited exam.  2. Two nonobstructive right parapelvic renal stones. There is no hydronephrosis  or hydroureter.    EGD 3/20:  PROCEDURE: The videoscope was passed through the upper pharynx with  minimal difficulty into the esophagus. Proximal, mid, and distal  esophagus were unremarkable except for a hiatal hernia sac. Once past the  hernia sac, the remaining body of the stomach was unremarkable. However,  in the antrum along the lesser curve, posterior wall, 4 ulcers were seen.  None of these ulcers had a visible vessel and there was no bleeding from  the ulcer. In the  anterior wall, there was a small erosion present. A  retroflex view of the angularis was normal. Retroflex view of the EG  junction and cardia ________. Attention was turned to the duodenum.  Duodenal bulb and C-loop were normal. No active ulcer crater was seen.  The endoscope was brought back to the stomach where photographs were  obtained. Because of no visible vessels or active bleeding, therapeutic  intervention was not performed. The endoscope ________ stomach,  ________ esophagus. The vocal cords ________ the recovery area in stable  condition.  IMPRESSION:  1. Multiple antral ulcers as described above, probable source of melena.  2. Hiatal hernia sac.    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 Given PO Vit K and FFP during hospital stay. She has been resumed on coumadin and Pharmacy assisting with management. Since INR subtherapeutic, will start on Lovenox 1 mg/kg Q 12 hrs. Pt followed by Dr. Anola Gurney as outpt. Hematology consulted and Dr. Domenic Moras to see pt. Will follow up additional recs.  - Migraine: Chronic. Her chart indicates history of narcotic seeking. Now resolved.  - Recurrent PE/Factor V Leiden mutation/Factor II deficiency: Pt on chronic coumadin and is managed by Dr. Anola Gurney as outpt. Pt reports that her goal INR is 3.0-3.5. Some outpt records obtained and in chart. Hematology consulted and Dr. Domenic Moras to see pt today.  - Depression/Anxiety/Bipolar disorder: Chronic. Continue her  usual outpt regimen of Seroquel, Cymbalta and Klonopin.  - Fe deficiency anemia: Pt transfused PRBC during hospital stay. Hgb/Hct has remained stable since blood transfusion.  - Epistaxis: Resolved. Due to supratherapeutic INR.  - Melena: Pt had ongoing melena following admission. GI was consulted and pt underwent EGD on 3/20. She was found to have multiple antral ulcers. GI recommends BID PPI x 8 weeks, then to continue on once daily dosing. H. Pylori results pending.  - ABLA: Transfused 2 units PRBC during hospital stay. Pt's Hgb/Hct has remained stable since.  - Abdominal pain: CT abd/pelvis with no acute findings aside from 2 nonobstructive right parapelvic renal stones. No e/o hydronephrosis or hydroureter. Pelvic U/S pending for today. Will repeat UA.        Care Plan discussed with: Patient/Family and Nurse    Total time spent with patient: 15 minutes.    Darien Ramus, MD  05/02/2013  10:21 AM

## 2013-05-02 NOTE — Progress Notes (Signed)
Report given to oncoming nurse, Erika Jackson, RN.

## 2013-05-02 NOTE — Progress Notes (Signed)
Bedside shift report received from previous nurse.  Pt has no needs or complaints at this time.

## 2013-05-02 NOTE — Progress Notes (Signed)
Resting quietly, resp even, unlab.  Eyes closed with relaxed facial expression.  No distress noted.

## 2013-05-02 NOTE — Consults (Signed)
[facility] Inpatient Consultation    Patient Name / SSN: Krista Lopez / [id number]    Consult requested by: Dr. Nevada Crane    Admission Date: 04/26/2013    Chief Complaint:  Severe over anticoagulation with bleeding in a patient on warfarin    Subjective:     she is a 47 y.o. Caucasian female presents with epistaxis, melena and headaches. Her initial INR is > 10. She was supposed to be taking warfarin 5 mg every day. She is under the care of Dr. Shearon Stalls. Her history is confusing at a bit contradictory. 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 She has IVC filter in place. 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  .   I managed to go through her chart here and at [facility]. She had Leiden mutation test at [facility], and here too by Dr. Everlene Other in 2010, and bith are negative. 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 Protein C and S levels were normal. The homocysteine level was not elevated. The lupus anticoagulant was not detected.  Many lower limb Venous Doppler's have been negative. CT of the chest on 04/22/13 showed only signs of chronic emboli, not acute emboli  She ha shad the over anticoagulation reversed with FFP and Vitamin K. Currently she is on Lovenox every day. INR is 2.0. She has gastric ulcers. 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      Venous Doppler 03/24/2012 [facility] - no DVT      Depression (04/26/2013)      Anxiety (04/26/2013)      Bipolar 2 disorder (04/26/2013)      Migraine (04/26/2013)      Iron deficiency anemia (04/26/2013)      Factor V Leiden mutation (04/26/2013)      Overview: Patient does not have Factor V leiden mutation. 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 Factor V Leiden mutation    ??? Infectious disease 02/2009      Hx MRSA / E.Coli bacteremia, MRSA wound infection 2/2 port   ??? Factor II deficiency    ??? Calculus of kidney    ??? Depression    ??? Iron deficiency    ??? Migraines    ??? Pulmonary embolism Multiple episodes     x 16 events - recent hospitalizations 3/14   ??? Bipolar disorder    ??? Anxiety    ???  Restless leg syndrome       Past Surgical History:  Past Surgical History   Procedure Laterality Date   ??? Cystoscopy  ???     x 13   ??? Lithotripsy  2006   ??? Hx vascular access  July, 2010     Power Port placed in [place], [place]., removed   ??? Hx vascular access Right 2013     pt has current rt subclavian port - [facility] at [facility]   ??? Hx appendectomy  ???   ??? Hx tonsillectomy     ??? Hx cesarean section     ??? Hx tubal ligation     ??? Hx ovarian cyst removal       on R     Prior to Admission Medications:  Prior to Admission Medications   Prescriptions Last Dose Informant Patient Reported? Taking?   KLONOPIN 1 mg Tab   Yes No   Sig: Take 1 mg by mouth three (3) times daily. Indications: Anxiety disorder   QUEtiapine (SEROQUEL) 300 mg tablet   No No   Sig: TAKE 1 TABLET BY MOUTH EVERY NIGHT   SUMAtriptan (IMITREX) 100 mg tablet   Yes No   Sig: Take 100 mg by mouth once as needed for Migraine.   duloxetine (CYMBALTA) 30 mg capsule   No No   Sig: Take 3 capsules by mouth daily. Indications: Bipolar Disorder   ondansetron (ZOFRAN ODT) 4 mg disintegrating tablet   No No   Sig: Take 1  tablet by mouth every eight (8) hours as needed for Nausea.   warfarin (COUMADIN) 5 mg tablet   Yes Yes   Sig: Take 5 mg by mouth nightly.      Facility-Administered Medications: None     Allergies:  Allergies   Allergen Reactions   ??? Ativan [Lorazepam] Itching   ??? Codeine Rash   ??? Iodinated Contrast Media - Iv Dye Rash     States also has swelling, and allergic to shellfish   ??? Ketorolac Tromethamine Rash   ??? Motrin [Ibuprofen] Other (comments)     Causes stomach upset after 3 doses.   ??? Pcn [Penicillins] Hives   ??? Pneumococcal Vaccine Swelling   ??? Pneumovax 23 [Pneumococcal 23-Val Ps Vaccine] Other (comments)     Local reaction   ??? Shellfish Containing Products Rash      Social History:  History   Substance Use Topics   ??? Smoking status: Never Smoker    ??? Smokeless tobacco: Never Used   ??? Alcohol Use: Yes      Comment: rarely - only drinks 2 times per year      Family History:  Family History   Problem Relation Age of Onset   ??? Bleeding Prob Father      Factor V   ??? Cancer Maternal Grandmother      metastatic breast CA.   ??? Ovarian Cancer Mother 108     passed away due to ca   ??? Bipolar Disorder Mother    ??? Kidney Disease Brother      kidney stones      Review of Systems  Constitutional: positive for fatigue and malaise  Eyes: negative  Ears, nose, mouth, throat, and face: negative  Respiratory: positive for dyspnea on exertion  Cardiovascular: negative  Gastrointestinal: positive for melena  Genitourinary:negative  Integument/breast: positive for skin color change  Hematologic/lymphatic: positive for bleeding and petechiae  Musculoskeletal:negative  Neurological: negative  Behvioral/Psych: positive for bipolar  Endocrine: negative  Allergic/Immunologic: negative    Objective:   Vitals:  Patient Vitals for the past 8 hrs:   BP Temp Pulse Resp SpO2   05/02/13 1130 96/53 mmHg 96.6 ??F (35.9 ??C) 72 17 96 %     Temp (24hrs), Avg:97.7 ??F (36.5 ??C), Min:96.6 ??F (35.9 ??C), Max:98.5 ??F (36.9 ??C)      Intake / Output:     Intake/Output Summary (Last 24 hours) at 05/02/13 1653  Last data filed at 05/02/13 0309   Gross per 24 hour   Intake      0 ml   Output    500 ml   Net   -500 ml       Physical Exam:  General appearance: alert, fatigued, cooperative, mild distress, appears stated age, mild confusion with her history  Head: Normocephalic, without obvious abnormality, atraumatic  Eyes: conjunctivae/corneas clear. PERRL, EOM's intact. Fundi benign  Ears: normal TM's and external ear canals AU  Nose: Nares normal. Septum midline. Mucosa normal. No drainage or sinus tenderness.  Throat: Lips, mucosa, and tongue normal. Teeth and gums normal  Neck: supple, symmetrical, trachea midline, no adenopathy, thyroid: not enlarged, symmetric, no tenderness/mass/nodules, no carotid bruit and no JVD  Back: symmetric, no curvature. ROM normal. No CVA tenderness.  Lungs: clear to auscultation bilaterally  Breasts: normal appearance, no masses or tenderness  Heart: regular rate and rhythm, S1, S2 normal, no murmur, click, rub or gallop  Abdomen: soft, non-tender. Bowel sounds normal. No masses,  no organomegaly  Pelvic: deferred  Extremities: extremities normal, atraumatic, no cyanosis or edema  Pulses: 2+ and symmetric  Skin: Skin color, texture, turgor normal. No rashes or lesions  Lymph nodes: Cervical, supraclavicular, and axillary nodes normal.  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      Venous Doppler 03/24/2012 [facility] - no DVT      Depression (04/26/2013)      Anxiety (04/26/2013)      Bipolar 2 disorder (04/26/2013)      Migraine (04/26/2013)      Iron deficiency anemia (04/26/2013)      Factor V Leiden mutation (04/26/2013)      Overview: Patient does not have Factor V leiden mutation. 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 Since she has only signs of chronic emboli on CT scan and no acute PE and has IVC filter with no lower limb DVT, I would only use low intensity antcoagulation for her. I would keep INR between 1.7 and 2.2. This is to prevent DVT  2. Her risk for a bleed is much more than another DVT and hence I would only use warfarin 2-3 mg per day and keep INR at the lower end.  3. She has bipoar illness and might be confused and not taking the medication as directed. Or other medications might be interacting with warfarin  4. Check warfarin sensitivity test to see if she has mutations that make her more sensitive to warfarin.    Call me if you need any more help with anticoagulation    Thank you Dr. Nevada Crane for the referral      [facility]

## 2013-05-02 NOTE — Progress Notes (Signed)
Patient resting in bed with eyes closed. Respirations even and unlabored. Facial expression relaxed.

## 2013-05-02 NOTE — Progress Notes (Signed)
Oxycodone given PO for abd pain rated at a 7/10. See MAR.

## 2013-05-02 NOTE — Progress Notes (Signed)
Resting quietly at present. Respirations even and unlabored, no distress noted. Denies needs or pain. Aware to call should needs arise. Will continue to monitor.

## 2013-05-03 LAB — CBC W/O DIFF
HCT: 32.9 % — ABNORMAL LOW (ref 35.8–46.3)
HGB: 10.4 g/dL — ABNORMAL LOW (ref 11.7–15.4)
MCH: 30.4 PG (ref 26.1–32.9)
MCHC: 31.6 g/dL (ref 31.4–35.0)
MCV: 96.2 FL (ref 79.6–97.8)
MPV: 10.1 FL — ABNORMAL LOW (ref 10.8–14.1)
PLATELET: 250 10*3/uL (ref 150–450)
RBC: 3.42 M/uL — ABNORMAL LOW (ref 4.05–5.25)
RDW: 14 % (ref 11.9–14.6)
WBC: 8.1 10*3/uL (ref 4.3–11.1)

## 2013-05-03 LAB — URINALYSIS W/ RFLX MICROSCOPIC
Bilirubin: NEGATIVE
Glucose: NEGATIVE mg/dL
Ketone: NEGATIVE mg/dL
Leukocyte Esterase: NEGATIVE
Nitrites: NEGATIVE
Protein: NEGATIVE mg/dL
RBC: >100 /hpf — ABNORMAL HIGH
Specific gravity: 1.016 (ref 1.001–1.023)
Urobilinogen: 0.2 EU/dL (ref 0.2–1.0)
pH (UA): 6 (ref 5.0–9.0)

## 2013-05-03 LAB — PROTHROMBIN TIME + INR
INR: 2.1 — ABNORMAL HIGH (ref 0.9–1.2)
Prothrombin time: 22.6 s — ABNORMAL HIGH (ref 9.6–12.0)

## 2013-05-03 MED ORDER — OXYCODONE 15 MG TAB
15 mg | ORAL_TABLET | Freq: Four times a day (QID) | ORAL | Status: DC | PRN
Start: 2013-05-03 — End: 2013-05-21

## 2013-05-03 MED ORDER — PANTOPRAZOLE 40 MG TAB, DELAYED RELEASE
40 mg | ORAL_TABLET | Freq: Two times a day (BID) | ORAL | Status: DC
Start: 2013-05-03 — End: 2013-07-18

## 2013-05-03 MED ORDER — WARFARIN 2 MG TAB
2 mg | ORAL_TABLET | Freq: Every evening | ORAL | Status: DC
Start: 2013-05-03 — End: 2013-05-20

## 2013-05-03 MED ORDER — ONDANSETRON 4 MG TAB, RAPID DISSOLVE
4 mg | ORAL_TABLET | Freq: Three times a day (TID) | ORAL | Status: DC | PRN
Start: 2013-05-03 — End: 2013-07-18

## 2013-05-03 MED ORDER — DULOXETINE 30 MG CAP, DELAYED RELEASE
30 mg | ORAL_CAPSULE | Freq: Every day | ORAL | Status: AC
Start: 2013-05-03 — End: 2013-05-10

## 2013-05-03 MED ORDER — CLONAZEPAM 1 MG TAB
1 mg | ORAL_TABLET | Freq: Three times a day (TID) | ORAL | Status: AC
Start: 2013-05-03 — End: 2013-05-10

## 2013-05-03 MED ORDER — QUETIAPINE 300 MG TAB
300 mg | ORAL_TABLET | Freq: Every evening | ORAL | Status: AC
Start: 2013-05-03 — End: 2013-05-10

## 2013-05-03 MED ADMIN — ondansetron (ZOFRAN) injection 4 mg: INTRAVENOUS | @ 16:00:00 | NDC 67457044000

## 2013-05-03 MED ADMIN — clonazePAM (KlonoPIN) tablet 1 mg: ORAL | @ 01:00:00 | NDC 00904610261

## 2013-05-03 MED ADMIN — DULoxetine (CYMBALTA) capsule 90 mg: ORAL | @ 12:00:00 | NDC 68001025704

## 2013-05-03 MED ADMIN — oxyCODONE IR (OXY-IR) immediate release tablet 15 mg: ORAL | @ 16:00:00 | NDC 68084018411

## 2013-05-03 MED ADMIN — oxyCODONE IR (OXY-IR) immediate release tablet 15 mg: ORAL | @ 12:00:00 | NDC 68084018411

## 2013-05-03 MED ADMIN — warfarin (COUMADIN) tablet 2 mg: ORAL | @ 01:00:00 | NDC 00056017001

## 2013-05-03 MED ADMIN — heparin (porcine) pf 300 Units: @ 16:00:00

## 2013-05-03 MED ADMIN — oxyCODONE IR (OXY-IR) immediate release tablet 15 mg: ORAL | @ 08:00:00 | NDC 68084018411

## 2013-05-03 MED ADMIN — oxyCODONE IR (OXY-IR) immediate release tablet 15 mg: ORAL | @ 01:00:00 | NDC 68084018411

## 2013-05-03 MED ADMIN — QUEtiapine (SEROquel) tablet 300 mg: ORAL | @ 01:00:00 | NDC 68084053211

## 2013-05-03 MED ADMIN — ondansetron (ZOFRAN) injection 4 mg: INTRAVENOUS | @ 12:00:00 | NDC 67457044000

## 2013-05-03 MED ADMIN — clonazePAM (KlonoPIN) tablet 1 mg: ORAL | @ 12:00:00 | NDC 00904610261

## 2013-05-03 MED ADMIN — ondansetron (ZOFRAN) injection 4 mg: INTRAVENOUS | @ 01:00:00 | NDC 67457044000

## 2013-05-03 MED ADMIN — pantoprazole (PROTONIX) tablet 40 mg: ORAL | @ 12:00:00 | NDC 68084081311

## 2013-05-03 MED FILL — CLONAZEPAM 1 MG TAB: 1 mg | ORAL | Qty: 1

## 2013-05-03 MED FILL — COUMADIN 2 MG TABLET: 2 mg | ORAL | Qty: 1

## 2013-05-03 MED FILL — ONDANSETRON (PF) 4 MG/2 ML INJECTION: 4 mg/2 mL | INTRAMUSCULAR | Qty: 2

## 2013-05-03 MED FILL — MONOJECT PREFILL ADVANCED (PF) 100 UNIT/ML INTRAVENOUS SYRINGE: 100 unit/mL | INTRAVENOUS | Qty: 3

## 2013-05-03 MED FILL — OXYCODONE 15 MG TAB: 15 mg | ORAL | Qty: 1

## 2013-05-03 MED FILL — DULOXETINE 60 MG CAP, DELAYED RELEASE: 60 mg | ORAL | Qty: 1

## 2013-05-03 MED FILL — QUETIAPINE 100 MG TAB: 100 mg | ORAL | Qty: 3

## 2013-05-03 MED FILL — PANTOPRAZOLE 40 MG TAB, DELAYED RELEASE: 40 mg | ORAL | Qty: 1

## 2013-05-03 NOTE — Progress Notes (Signed)
Pt called and stated that she had urinated. Provided pt with more water and instructed her to call when finished drinking and not to urinate and explained that her bladder needed to be full for ultrasound.

## 2013-05-03 NOTE — Progress Notes (Signed)
Pt assessment completed. Pt shows no s/s distress, breathing even and unlabored. HR regular. Abdomen soft with active bowel sounds. Ultrasound called and asked pt to fill bladder. Pt was asked to drink 2 cups of water and then to call nurse when finished. All needs met at this time.

## 2013-05-03 NOTE — Progress Notes (Signed)
Pt c/o pain and nausea. See MAR for administration Oxy IR and Zofran SIVP.

## 2013-05-03 NOTE — Discharge Summary (Addendum)
Physician Discharge Summary     Patient ID:  Krista Lopez  [id number]  47 y.o.  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      Venous Doppler 03/24/2012 [facility] - no DVT      Depression (04/26/2013)      Anxiety (04/26/2013)      Bipolar 2 disorder (04/26/2013)      Iron deficiency anemia (04/26/2013)      Factor V Leiden mutation (04/26/2013)      Overview: Patient does not have Factor V leiden mutation. 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 She reports a HA located behind her L eye which she describes feeling like a "storm and thundering." Sx started 2 days ago with sx coming on all of a sudden. The pain is at a severity of 8/10 and is associated with N/V and is worsened by lights. She reports  taking her last Imitrex tab last night and she took an Oxycodone 5 mg tab with no relief. She also reported having nosebleeds. This morning she reported having 1 episode of dark stool. She reports mild R lower abd cramping. She had 2 episodes of vomiting today and noticed some blood in the vomit which she describes as "pink." She also reports her urine being pink. She reports having an EGD and colonoscopy in 2005-2006.    Hospital Course:   Pt was admitted for further management. Pt's INR in the ER on presentation was noted to be undetectable. Her Hgb was 12.1 on 04/14/13 and on presentation had dropped to 9.6. During her hospital stay, she was treated with PO Vit K and FFP. She also received 2 units PRBC transfusion. Her Hgb/Hct remained stable following the blood transfusion. GI was consulted and pt underwent EGD on 3/20. Pt was found to have  multiple antral ulcers which was likely the source of her melena. GI recommended to continue on twice daily Protonix x 8 weeks, and then to be transitioned to once daily thereafter. H. Pylori results are pending at discharge. Pt c/o persistent abd pain but CT abd/pelvis was negative for acute issues. This only revealed 2 nonobstructive right parapelvic renal stones. No e/o hydronephrosis or hydroureter. Pelvic U/S was also unremarkable. Repeat UA only notable for hematuria with no e/o infection. Pt reports seeing a Urologist as outpt - Dr. Alessandra Bevels. She has h/o chronic abd pain and per her chart history of narcotic seeking. The patient was resumed on Coumadin and Hematology was consulted for recommendations on her anticoagulation. Pt is followed by Dr. Anola Gurney as an outpt. Pt was seen by Dr. Domenic Moras during her hospital stay. Dr. Domenic Moras was able to review both [facility] and [facility] records. Per Dr. Domenic Moras, "she had Leiden mutation test at [facility], and here too by Dr. Everlene Other in 2010, and both are negative. Test in 2008 shows that she has one copy of the Prothromin 20210A  mutation. Protein C and S levels were normal. The homocysteine level was not elevated. The lupus anticoagulant was not detected. Many lower limb Venous Doppler's have been negative. CT of the chest on 04/22/13 showed only signs of chronic emboli, not acute emboli." Per Dr. Rennis Petty recommendations, since she has only signs of chronic emboli on CT scan and no acute PE and has IVC filter with no lower limb DVT, he recommends to use low intensity antcoagulation for her and to maintain INR between 1.7 and 2.2 to prevent DVT. Her risk for a bleed is much more than another DVT at this point and recommends to use warfarin 2-3 mg per day and keep INR at the lower end. Dr. Domenic Moras recommends a Warfarin sensitivity test to see if she has mutations that make her more sensitive to warfarin.      PCP: Dr. Quentin Angst    Consults: GI and Hematology/Oncology    Significant Diagnostic Studies:     Data Review    Recent Results (from the past 24 hour(s))   URINALYSIS W/ RFLX MICROSCOPIC    Collection Time     05/02/13  8:40 PM       Result Value Ref Range    Color YELLOW      Appearance CLOUDY      Specific gravity 1.016  1.001 - 1.023      pH (UA) 6.0  5.0 - 9.0      Protein NEGATIVE   NEG mg/dL    Glucose NEGATIVE       Ketone NEGATIVE   NEG mg/dL    Bilirubin NEGATIVE   NEG      Blood LARGE (*) NEG      Urobilinogen 0.2  0.2 - 1.0 EU/dL    Nitrites NEGATIVE   NEG      Leukocyte Esterase NEGATIVE   NEG      WBC 3-5  0 /hpf    RBC >100 (*) 0 /hpf    Epithelial cells 3-5  0 /hpf    Bacteria TRACE  0 /hpf    Casts 0-3  0 /lpf   PROTHROMBIN TIME + INR    Collection Time     05/03/13  5:30 AM       Result Value Ref Range    Prothrombin time 22.6 (*) 9.6 - 12.0 sec    INR 2.1 (*) 0.9 - 1.2  CBC W/O DIFF    Collection Time     05/03/13  5:30 AM       Result Value Ref Range    WBC 8.1  4.3 - 11.1 K/uL    RBC 3.42 (*) 4.05 - 5.25 M/uL    HGB 10.4 (*) 11.7 - 15.4 g/dL    HCT 82.9 (*) 56.2 - 46.3 %    MCV 96.2  79.6 - 97.8 FL    MCH  30.4  26.1 - 32.9 PG    MCHC 31.6  31.4 - 35.0 g/dL    RDW 13.0  86.5 - 78.4 %    PLATELET 250  150 - 450 K/uL    MPV 10.1 (*) 10.8 - 14.1 FL     CT head noncontrast 3/17:  FINDINGS: The brain appears normal. Exam is negative for acute hemorrhage,  mass, or mass effect. No definite CT evidence of acute major vascular territory  infarct.  Ventricles normal size and contour. No abnormal intracranial fluid collections.  Bony calvarium intact. Mastoid air cells and paranasal sinuses clear where  visualized.  IMPRESSION: Negative noncontrast CT brain.    CT abd/pelvis noncontrast 3/21:  FINDINGS: Evaluation of the hollow and solid viscera is limited by the lack of  intravenous contrast.  The lung bases are clear.  CT abdomen: The IVC filter is in place. No gallstones evident. Two  nonobstructive right parapelvic renal calculi appreciated. The left kidney and  ureter are unremarkable. A small amount of oral contrast is appreciated in the  ileum. No free intraperitoneal air evident to suggest perforation of an ulcer.  CT pelvis: The uterus and adnexa are unremarkable. The bladder is unremarkable.  No inguinal hernia or adenopathy. The rectum and sigmoid colon are full of  stool. No free fluid collection in the pelvis.  No aggressive osseous lesions identified.  IMPRESSION:  1. No definite acute abdominal or pelvic abnormality noted on this limited exam.  2. Two nonobstructive right parapelvic renal stones. There is no hydronephrosis  or hydroureter.    EGD 3/20:  PROCEDURE: The videoscope was passed through the upper pharynx with  minimal difficulty into the esophagus. Proximal, mid, and distal  esophagus were unremarkable except for a hiatal hernia sac. Once past the  hernia sac, the remaining body of the stomach was unremarkable. However,  in the antrum along the lesser curve, posterior wall, 4 ulcers were seen.  None of these ulcers had a visible vessel and there was no bleeding from  the ulcer. In the anterior wall,  there was a small erosion present. A  retroflex view of the angularis was normal. Retroflex view of the EG  junction and cardia ________. Attention was turned to the duodenum.  Duodenal bulb and C-loop were normal. No active ulcer crater was seen.  The endoscope was brought back to the stomach where photographs were  obtained. Because of no visible vessels or active bleeding, therapeutic  intervention was not performed. The endoscope ________ stomach,  ________ esophagus. The vocal cords ________ the recovery area in stable  condition.  IMPRESSION:  1. Multiple antral ulcers as described above, probable source of melena.  2. Hiatal hernia sac.    Pelvic U/S 3/24:  Findings: Uterus measures approximately 7.3 x 2.7 x 4.2 cm. Myometrium has a  background normal uniform echotexture. Endometrial stripe measures approximately  0.3 cm. The right and left ovary were well-visualized. Right ovary measures  approximately 1.9 x 0.9 x 1.4 cm, and left ovary measures  approximately 1.9 x  1.1 x 1.3 cm. Normal Doppler flow in bilateral ovaries. No free fluid was seen  within the cul-de-sac.   Impression: Normal pelvic ultrasound.       Discharge Exam:  BP 99/67    Pulse 72    Temp(Src) 98.2 ??F (36.8 ??C)    Resp 18    Ht 5\' 4"  (1.626 m)    Wt 63.05 kg (139 lb)    BMI 23.85 kg/m2      SpO2 94%    LMP 07/28/2004     General:  Alert, cooperative, no distress, appears stated age.   Eyes:  Conjunctivae/sclera normal. No scleral icterus. EOMs intact.    Lungs:   Clear to auscultation bilaterally.   Heart:  Regular rate and rhythm, S1, S2 normal, no murmur, click, rub or gallop.   Abdomen:   Soft. Bowel sounds normal. Mild TTP bilateral lower quadrants. No masses,  No organomegaly.   Extremities: Extremities normal, atraumatic, no cyanosis or edema.   Pulses: 2+ and symmetric all extremities.   Skin: Skin color, texture, turgor normal. No rashes or lesions.   Neurologic: Grossly normal.       Disposition: home    Patient Instructions:    Current Discharge Medication List      START taking these medications    Details   pantoprazole (PROTONIX) 40 mg tablet Take 1 Tab by mouth Before breakfast and dinner.  Qty: 60 Tab, Refills: 1      oxyCODONE IR (OXY-IR) 15 mg immediate release tablet Take 1 Tab by mouth every six (6) hours as needed. Max Daily Amount: 60 mg.  Qty: 28 Tab, Refills: 0         CONTINUE these medications which have CHANGED    Details   DULoxetine (CYMBALTA) 30 mg capsule Take 3 Caps by mouth daily for 7 days. Indications: Bipolar Disorder  Qty: 21 Cap, Refills: 0    Associated Diagnoses: Bipolar 2 disorder      clonazePAM (KLONOPIN) 1 mg tablet Take 1 Tab by mouth three (3) times daily for 7 days. Max Daily Amount: 3 mg. Indications: Anxiety disorder  Qty: 21 Tab, Refills: 0      QUEtiapine (SEROQUEL) 300 mg tablet Take 1 Tab by mouth nightly for 7 days.  Qty: 7 Tab, Refills: 0      warfarin (COUMADIN) 2 mg tablet Take 1 Tab by mouth nightly.  Qty: 30 Tab, Refills: 0      ondansetron (ZOFRAN ODT) 4 mg disintegrating tablet Take 1 Tab by mouth every eight (8) hours as needed for Nausea.  Qty: 20 Tab, Refills: 0         CONTINUE these medications which have NOT CHANGED    Details   SUMAtriptan (IMITREX) 100 mg tablet Take 100 mg by mouth once as needed for Migraine.           Activity: Activity as tolerated  Diet: Regular Diet  Wound Care: None needed    Follow-up with PCP - Dr. Quentin AngstBhansaly in 1 week. Also keep scheduled appt with Dr. Anola GurneyWalls (Hematology) for INR check on Thursday, 3/26. [facility] - [phone] per patient's request.  Follow-up tests/labs: INR check on Thursday, 3/26. NEW GOAL INR PER DR. RAVICHANDER (HEMATOLOGY) 1.7-2.2.  PENDING RESULTS AT DISCHARGE: H. Pylori testing pending  Amount of time spent on discharge:>30 min    Signed:  Darien RamusAlexandra M Ashanta Amoroso, MD  05/03/2013  12:26 PM

## 2013-05-03 NOTE — Progress Notes (Signed)
Pt c/o pain and nausea. See MAR for administration Oxy IR and Zofran SIVP.

## 2013-05-03 NOTE — Telephone Encounter (Signed)
[facility] called for a TRANSITION OF CARE ER Follow up in one week. Pt sees dr Quentin Angstbhansaly

## 2013-05-03 NOTE — Progress Notes (Signed)
Oxycodone IR 15 mg given for c/o abdominal pain 8/10. Aware to call should needs arise. Will monitor.

## 2013-05-05 LAB — H. PYLORI ABS, IGG, IGA, IGM
H. pylori Ab, IgA: 9 units — ABNORMAL HIGH (ref 0.0–8.9)
H. pylori Ab, IgG: <0.9 U/mL (ref 0.0–0.8)
H. pylori Ab, IgM: <9 units (ref 0.0–8.9)

## 2013-05-18 ENCOUNTER — Inpatient Hospital Stay
Admit: 2013-05-18 | Discharge: 2013-05-21 | Disposition: A | Discharge status: Home / Self Care | Payer: MEDICARE | Source: Other Acute Inpatient Hospital | Attending: Hospitalist | Admitting: Hospitalist

## 2013-05-18 ENCOUNTER — Inpatient Hospital Stay
Admit: 2013-05-18 | Discharge: 2013-05-18 | Disposition: A | Discharge status: Other Acute Inpatient Hospital | Payer: MEDICARE | Attending: Emergency Medicine

## 2013-05-18 DIAGNOSIS — IMO0002 Reserved for concepts with insufficient information to code with codable children: Secondary | ICD-10-CM

## 2013-05-18 LAB — D-DIMER, QUANTITATIVE: D-Dimer, Quant: 0.25 ug/ml(FEU) (ref <0.55)

## 2013-05-18 LAB — TYPE AND SCREEN
ABO/Rh: A POS
Antibody Screen: NEGATIVE

## 2013-05-18 LAB — CBC WITH AUTOMATED DIFF
ABS. BASOPHILS: 0 10*3/uL (ref 0.0–0.2)
ABS. EOSINOPHILS: 0.6 10*3/uL (ref 0.0–0.8)
ABS. IMM. GRANS.: 0 10*3/uL (ref 0.0–0.5)
ABS. LYMPHOCYTES: 1.3 10*3/uL (ref 0.5–4.6)
ABS. MONOCYTES: 0.7 10*3/uL (ref 0.1–1.3)
ABS. NEUTROPHILS: 2.7 10*3/uL (ref 1.7–8.2)
BASOPHILS: 1 % (ref 0.0–2.0)
EOSINOPHILS: 11 % — ABNORMAL HIGH (ref 0.5–7.8)
HCT: 27.8 % — ABNORMAL LOW (ref 35.8–46.3)
HGB: 8.8 g/dL — ABNORMAL LOW (ref 11.7–15.4)
IMMATURE GRANULOCYTES: 0.2 % (ref 0.0–5.0)
LYMPHOCYTES: 25 % (ref 13–44)
MCH: 30.1 PG (ref 26.1–32.9)
MCHC: 31.7 g/dL (ref 31.4–35.0)
MCV: 95.2 FL (ref 79.6–97.8)
MONOCYTES: 13 % — ABNORMAL HIGH (ref 4.0–12.0)
MPV: 10.3 FL — ABNORMAL LOW (ref 10.8–14.1)
NEUTROPHILS: 50 % (ref 43–78)
PLATELET: 251 10*3/uL (ref 150–450)
RBC: 2.92 M/uL — ABNORMAL LOW (ref 4.05–5.25)
RDW: 14.1 % (ref 11.9–14.6)
WBC: 5.4 10*3/uL (ref 4.3–11.1)

## 2013-05-18 LAB — FIBRINOGEN: Fibrinogen: 271 mg/dL (ref 202–416)

## 2013-05-18 LAB — METABOLIC PANEL, COMPREHENSIVE
A-G Ratio: 1.3 (ref 1.2–3.5)
ALT (SGPT): 22 U/L (ref 12–65)
AST (SGOT): 16 U/L (ref 15–37)
Albumin: 3.2 g/dL — ABNORMAL LOW (ref 3.5–5.0)
Alk. phosphatase: 96 U/L (ref 50–136)
Anion gap: 9 mmol/L (ref 7–16)
BUN: 12 MG/DL (ref 6–23)
Bilirubin, total: 0.2 MG/DL (ref 0.2–1.1)
CO2: 26 mmol/L (ref 21–32)
Calcium: 7.7 MG/DL — ABNORMAL LOW (ref 8.3–10.4)
Chloride: 105 mmol/L (ref 98–107)
Creatinine: 0.8 MG/DL (ref 0.6–1.0)
GFR est AA: >60 mL/min/{1.73_m2} (ref >60)
GFR est non-AA: >60 mL/min/{1.73_m2} (ref >60)
Globulin: 2.5 g/dL (ref 2.3–3.5)
Glucose: 93 mg/dL (ref 65–100)
Potassium: 4.3 mmol/L (ref 3.5–5.1)
Protein, total: 5.7 g/dL — ABNORMAL LOW (ref 6.3–8.2)
Sodium: 140 mmol/L (ref 136–145)

## 2013-05-18 LAB — HCG URINE, QL. - POC: Pregnancy test,urine (POC): NEGATIVE

## 2013-05-18 LAB — TYPE & SCREEN
ABO/Rh(D): A POS
Antibody screen: NEGATIVE

## 2013-05-18 LAB — D DIMER: D DIMER: 0.25 ug/ml(FEU) (ref <0.55)

## 2013-05-18 LAB — RETICULOCYTE COUNT
Absolute Retic Cnt.: 0.0266 M/ul (ref 0.0164–0.0776)
Immature Retic Fraction: 5 % (ref 3.0–15.9)
Retic Hgb Conc.: 33 pg (ref 29–35)
Reticulocyte count: 0.9 % (ref 0.3–2.0)

## 2013-05-18 LAB — PROTHROMBIN TIME + INR
INR: 2.9 — ABNORMAL HIGH (ref 0.9–1.2)
Prothrombin time: 31.4 s — ABNORMAL HIGH (ref 9.6–12.0)

## 2013-05-18 LAB — PTT: aPTT: >100 s — CR (ref 25.3–32.9)

## 2013-05-18 MED ORDER — DIPHENHYDRAMINE HCL 50 MG/ML IJ SOLN
50 mg/mL | INTRAMUSCULAR | Status: AC
Start: 2013-05-18 — End: 2013-05-18
  Administered 2013-05-18: 16:00:00 via INTRAVENOUS

## 2013-05-18 MED ORDER — HYDROMORPHONE (PF) 1 MG/ML IJ SOLN
1 mg/mL | INTRAMUSCULAR | Status: AC
Start: 2013-05-18 — End: 2013-05-18
  Administered 2013-05-18: 21:00:00 via INTRAVENOUS

## 2013-05-18 MED ORDER — ONDANSETRON (PF) 4 MG/2 ML INJECTION
4 mg/2 mL | INTRAMUSCULAR | Status: AC
Start: 2013-05-18 — End: 2013-05-18
  Administered 2013-05-18: 16:00:00 via INTRAVENOUS

## 2013-05-18 MED ORDER — MORPHINE 10 MG/ML INJ SOLUTION
10 mg/ml | INTRAMUSCULAR | Status: AC
Start: 2013-05-18 — End: 2013-05-18
  Administered 2013-05-18: 16:00:00 via INTRAVENOUS

## 2013-05-18 MED ORDER — HYDROMORPHONE (PF) 1 MG/ML IJ SOLN
1 mg/mL | INTRAMUSCULAR | Status: AC
Start: 2013-05-18 — End: 2013-05-18
  Administered 2013-05-18: 18:00:00 via INTRAVENOUS

## 2013-05-18 MED ORDER — SODIUM CHLORIDE 0.9 % IV
INTRAVENOUS | Status: DC | PRN
Start: 2013-05-18 — End: 2013-05-18
  Administered 2013-05-18: 19:00:00 via INTRAVENOUS

## 2013-05-18 MED ADMIN — protamine injection 30 mg: INTRAVENOUS | @ 22:00:00 | NDC 63323022994

## 2013-05-18 MED FILL — MORPHINE 10 MG/ML IJ SOLN: 10 mg/mL | INTRAMUSCULAR | Qty: 1

## 2013-05-18 MED FILL — ONDANSETRON (PF) 4 MG/2 ML INJECTION: 4 mg/2 mL | INTRAMUSCULAR | Qty: 2

## 2013-05-18 MED FILL — DIPHENHYDRAMINE HCL 50 MG/ML IJ SOLN: 50 mg/mL | INTRAMUSCULAR | Qty: 1

## 2013-05-18 MED FILL — HYDROMORPHONE (PF) 1 MG/ML IJ SOLN: 1 mg/mL | INTRAMUSCULAR | Qty: 1

## 2013-05-18 MED FILL — PROTAMINE 10 MG/ML IV: 10 mg/mL | INTRAVENOUS | Qty: 3

## 2013-05-18 NOTE — ED Notes (Signed)
Labs were drawn for power port on Right chest following a 5ml draw and waste. Port flushed following blood draw.

## 2013-05-18 NOTE — ED Provider Notes (Addendum)
HPI Comments: Pt had blood drawn for INR check Monday. Noted to be low so started on lovenox. Tuesday around 1600 developed some pain and swelling in right arm around elbow and bleeding from [facility] where blood was drawn. Shaved yesterday and had blood from nicks in legs. Has a lot of ecchymosis and hematomas lower abd TTP. Has been bleeding from left ear and left nare since last night. Pt has a port in her right arm also.     Patient is a 48 y.o. female presenting with arm pain. [name] No language interpreter was used.   Arm Pain   This is a new problem. The current episode started yesterday. The problem occurs constantly. The problem has been gradually worsening. The pain is present in the right arm. The quality of the pain is described as aching and constant. The pain is severe. Associated symptoms include limited range of motion. Pertinent negatives include no numbness, no back pain and no neck pain. The symptoms are aggravated by palpation and movement. She has tried nothing for the symptoms. There has been no history of extremity trauma.        Past Medical History   Diagnosis Date   ??? 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 Infectious disease 02/2009      Hx MRSA / E.Coli bacteremia, MRSA wound infection 2/2 port   ??? 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 Calculus of kidney    ??? Depression    ??? Iron deficiency    ??? Migraines    ??? 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 Bipolar disorder ([place])    ??? Anxiety    ??? Restless leg syndrome         Past Surgical History   Procedure Laterality Date   ??? Cystoscopy  ???     x 13   ??? Lithotripsy  2006   ??? Hx vascular access  July, 2010     Power Port placed in [place], [place]., removed   ??? Hx vascular access Right 2013     pt has current rt subclavian port - placed at [facility]   ??? Hx appendectomy  ???   ??? Hx tonsillectomy     ??? Hx cesarean section     ??? Hx tubal ligation     ??? Hx ovarian cyst  removal       on R         Family History   Problem Relation Age of Onset   ??? Bleeding Prob Father      Factor V   ??? Cancer Maternal Grandmother      metastatic breast CA.   ??? Ovarian Cancer Mother 23     passed away due to ca   ??? Bipolar Disorder Mother    ??? Kidney Disease Brother      kidney stones        History     Social History   ??? Marital Status: DIVORCED     Spouse Name: N/A     Number of Children: N/A   ??? Years of Education: N/A     Occupational History   ??? Not on file.     Social History Main Topics   ??? Smoking status: Never Smoker    ??? Smokeless tobacco: Never Used   ??? Alcohol Use: Yes      Comment:  rarely - only drinks 2 times per year   ??? Drug Use: No      Comment: narcotic seeking per hospital hx.   ??? Sexual Activity:     Partners: Male     Birth Control/ Protection: Surgical     Other Topics Concern   ??? Not on file     Social History Narrative                  ALLERGIES: Ativan; Codeine; Iodinated contrast media - iv dye; Ketorolac tromethamine; Motrin; Pcn; Pneumococcal vaccine; Pneumovax 23; and Shellfish containing products      Review of Systems   Constitutional: Negative for fever and chills.   HENT: Positive for ear discharge (blood) and nosebleeds. Negative for ear pain, rhinorrhea and sore throat.    Eyes: Negative for pain and redness.   Respiratory: Negative for chest tightness, shortness of breath and wheezing.    Cardiovascular: Negative for chest pain and leg swelling.   Gastrointestinal: Negative for nausea, vomiting, abdominal pain and diarrhea.   Genitourinary: Negative for dysuria and hematuria.   Musculoskeletal: Positive for joint swelling. Negative for back pain, gait problem, neck pain and neck stiffness.   Skin: Positive for color change and pallor. Negative for rash and wound.   Neurological: Negative for weakness, numbness and headaches.       Filed Vitals:    05/18/13 0910   BP: 134/60   Pulse: 88   Temp: 98.4 ??F (36.9 ??C)   Resp: 18   Height: 5' 4.5" (1.638 m)   Weight: 63.05  kg (139 lb)   SpO2: 98%            Physical Exam   Constitutional: She is oriented to person, place, and time. She appears well-developed and well-nourished. She appears distressed.   HENT:   Head: Normocephalic and atraumatic.   Left ear with blood in canal unable to visualize the ear drum.      Eyes: Conjunctivae and EOM are normal. Pupils are equal, round, and reactive to light.   Neck: Normal range of motion. Neck supple.   Cardiovascular: Normal rate and regular rhythm.    Pulmonary/Chest: Effort normal and breath sounds normal. She has no wheezes.   Abdominal: Soft. Bowel sounds are normal. There is tenderness (TTP over hematomas lower abd with eccymosis present. ).   Musculoskeletal: She exhibits edema and tenderness.   Right arm swollen from mid bicep down to hand. LROM in elbow. Distal radial pulse present but weak. Cap refill delayed. Pain with movement of elbow. Tense skin around swelling. Bleeding from [facility] puncture site.     3:03 PM  Using the stryker needle found elevated compartment pressure in posterior compartment of forearm.    Neurological: She is alert and oriented to person, place, and time.   Skin: Skin is warm and dry. There is pallor.   Multiple nicks on BLE with dried blood.      Nursing note and vitals reviewed.       MDM  Number of Diagnoses or Management Options  Arm swelling:   Compartment syndrome [facility]):   Hematoma:   Supratherapeutic INR:   Diagnosis management comments: Revaluated the arm and still swollen and painful. Radial pulse still present with delayed cap refill.     1:23 PM  Reevaluated and found still with swelling and TTP. RAdial pulse present but weak. Still with delayed cap refill. LROM at elbow. Mild coolness to finger tips.  1510 spoke with Marzetta Board and told her about the elevated compartment pressure and that I need Dr. Marion Downer or someone to come in and evaluate.   2:33 PM  Finally got a call back from [place] with ortho and Dr. Marion Downer thinks that without seeing the pt this  is not compartment syndrome due to it being a sub Q hematoma and no pain with passive ROM in fingers. Still awaiting stryker needle to check pressures.     3:52 PM  Ortho request hospitalist to admit with transfer downtown for possible vascular compromise.        Amount and/or Complexity of Data Reviewed  Clinical lab tests: ordered and reviewed  Tests in the radiology section of CPT??: ordered and reviewed  Tests in the medicine section of CPT??: ordered and reviewed    Risk of Complications, Morbidity, and/or Mortality  Presenting problems: moderate  Diagnostic procedures: moderate  Management options: moderate    Patient Progress  Patient progress: stable      Procedures     Final result (05/18/2013 10:30 AM) Open       Study Result    Portable chest  Indication: Right arm swelling and bruising since yesterday. Patient also  bleeding from nose and ears.  Comparison: 04/24/2013  Findings: Right internal jugular Port-A-Cath unchanged. Lungs are clear and  normally expanded. No focal infiltrate or effusion. The heart is not enlarged.  The chest wall structures are grossly intact.  Impression: No acute abnormality.      Final result (05/18/2013 12:09 PM) Open       Study Result    Right upper extremity venous duplex ultrasound  Indication: Right upper extremity swelling  Findings: Grayscale and color and spectral duplex imaging of the right upper  extremity venous structures was performed. The right internal jugular vein,  innominate vein, subclavian vein, axillary vein, brachial vein, and radial and  ulnar veins are patent. There is no right upper extremity venous thrombus. At  the area of swelling near the right humerus there is a 12.1 x 6.5 x 3.3 cm mixed  density structure most consistent with hematoma.  Impression: Negative for right upper extremity DVT. Large superficial hematoma.     Results Include:    Recent Results (from the past 24 hour(s))   CBC WITH AUTOMATED DIFF    Collection Time     05/18/13 11:33 AM        Result Value Ref Range    WBC 5.4  4.3 - 11.1 K/uL    RBC 2.92 (*) 4.05 - 5.25 M/uL    HGB 8.8 (*) 11.7 - 15.4 g/dL    HCT 27.8 (*) 35.8 - 46.3 %    MCV 95.2  79.6 - 97.8 FL    MCH 30.1  26.1 - 32.9 PG    MCHC 31.7  31.4 - 35.0 g/dL    RDW 14.1  11.9 - 14.6 %    PLATELET 251  150 - 450 K/uL    MPV 10.3 (*) 10.8 - 14.1 FL    DF AUTOMATED      NEUTROPHILS 50  43 - 78 %    LYMPHOCYTES 25  13 - 44 %    MONOCYTES 13 (*) 4.0 - 12.0 %    EOSINOPHILS 11 (*) 0.5 - 7.8 %    BASOPHILS 1  0.0 - 2.0 %    IMMATURE GRANULOCYTES 0.2  0.0 - 5.0 %    ABS. NEUTROPHILS 2.7  1.7 - 8.2 K/UL  ABS. LYMPHOCYTES 1.3  0.5 - 4.6 K/UL    ABS. MONOCYTES 0.7  0.1 - 1.3 K/UL    ABS. EOSINOPHILS 0.6  0.0 - 0.8 K/UL    ABS. BASOPHILS 0.0  0.0 - 0.2 K/UL    ABS. IMM. GRANS. 0.0  0.0 - 0.5 K/UL   METABOLIC PANEL, COMPREHENSIVE    Collection Time     05/18/13 11:33 AM       Result Value Ref Range    Sodium 140  136 - 145 mmol/L    Potassium 4.3  3.5 - 5.1 mmol/L    Chloride 105  98 - 107 mmol/L    CO2 26  21 - 32 mmol/L    Anion gap 9  7 - 16 mmol/L    Glucose 93  65 - 100 mg/dL    BUN 12  6 - 23 MG/DL    Creatinine 0.80  0.6 - 1.0 MG/DL    GFR est AA >60  >60 ml/min/1.27m    GFR est non-AA >60  >60 ml/min/1.772m   Calcium 7.7 (*) 8.3 - 10.4 MG/DL    Bilirubin, total 0.2  0.2 - 1.1 MG/DL    ALT 22  12 - 65 U/L    AST 16  15 - 37 U/L    Alk. phosphatase 96  50 - 136 U/L    Protein, total 5.7 (*) 6.3 - 8.2 g/dL    Albumin 3.2 (*) 3.5 - 5.0 g/dL    Globulin 2.5  2.3 - 3.5 g/dL    A-G Ratio 1.3  1.2 - 3.5     PROTHROMBIN TIME + INR    Collection Time     05/18/13 11:33 AM       Result Value Ref Range    Prothrombin time 31.4 (*) 9.6 - 12.0 sec    INR 2.9 (*) 0.9 - 1.2     PTT    Collection Time     05/18/13 11:33 AM       Result Value Ref Range    aPTT >100.0 (*) 25.3 - 32.9 SEC   RETICULOCYTE COUNT    Collection Time     05/18/13 11:33 AM       Result Value Ref Range    Reticulocyte count 0.9  0.3 - 2.0 %    Absolute Retic Cnt. 0.0266  0.0164 - 0.0776  M/ul    Immature Retic Fraction 5.0  3.0 - 15.9 %    Retic Hgb Conc. 33  29 - 35 pg   D DIMER    Collection Time     05/18/13 11:33 AM       Result Value Ref Range    D DIMER 0.25  <0.55 ug/ml(FEU)   FIBRINOGEN    Collection Time     05/18/13 11:33 AM       Result Value Ref Range    Fibrinogen 271  202 - 416 mg/dL   HCG URINE, QL. - POC    Collection Time     05/18/13  1:08 PM       Result Value Ref Range    Pregnancy test,urine (POC) NEGATIVE   NEG

## 2013-05-18 NOTE — ED Notes (Signed)
The patient status is unchanged. They are laying in bed, resting eyes closed, easily aroused with verbal stimuli. Family is not present at bedside. No new complaints or needs were expressed.

## 2013-05-18 NOTE — ED Notes (Signed)
Called ortho for consult.

## 2013-05-18 NOTE — Progress Notes (Signed)
Bed requested. Still none assigned.  Charge nurse asked to check on this again.    Colbert EwingKerisea Michille Mcelrath

## 2013-05-18 NOTE — H&P (Addendum)
History and Physical    Subjective:     Krista Lopez is a 48 y.o. Caucasian female who presents with left upper extremity pain and swelling.  Asked to evaluate and admit pt to [facility] at the request of ER Md.  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  Pt also has IVC filter.  Pt had INR checked on Monday which was sub-therapeutic so pt was started on bridge dose lovenox $RemoveBefo'60mg'[id number]$  injections and coumadin dose increased.  Pt noted swelling to her right arm starting yesterday w/significant swelling and pain since.  Pt was seen in ER with concerns for compartment syndrome.  US of the UE showed hematoma but no DVT and striker needle eval per ER MD was c/w compartment syndrome in posterior compartment of the forearm.  Consultation was made with Dr. Marion Downer who deferred to hospitalist due to her coagulopathy and since it was felt her current findings were not c/w compartment syndrome and more pain from her hematoma.  DT admission was requested in case intervention was needed in the future.    In ER, INR 2.9 but PTT >100.  She was given FFP by ER MD.  Protamine ordered.     Past Medical History   Diagnosis Date   ??? 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 Infectious disease 02/2009      Hx MRSA / E.Coli bacteremia, MRSA wound infection 2/2 port   ??? 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 Calculus of kidney    ??? Depression    ??? Iron deficiency    ??? Migraines    ??? 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 Bipolar disorder ([place])    ??? Anxiety    ??? Restless leg syndrome       Past Surgical History   Procedure Laterality Date   ??? Cystoscopy  ???     x 13   ??? Lithotripsy  2006   ??? Hx vascular access  July, 2010     Power Port placed in [place], [place]., removed   ??? Hx vascular access Right 2013     pt has current rt subclavian port - placed at [facility]   ??? Hx  appendectomy  ???   ??? Hx tonsillectomy     ??? Hx cesarean section     ??? Hx tubal ligation     ??? Hx ovarian cyst removal       on R     Family History   Problem Relation Age of Onset   ??? Bleeding Prob Father      Factor V   ??? Cancer Maternal Grandmother      metastatic breast CA.   ??? Ovarian Cancer Mother 39     passed away due to ca   ??? Bipolar Disorder Mother    ??? Kidney Disease Brother      kidney stones      History   Substance Use Topics   ??? Smoking status: Never Smoker    ??? Smokeless tobacco: Never Used   ??? Alcohol Use: Yes      Comment: rarely - only drinks 2 times per year       Prior to Admission medications    Medication Sig Start Date End Date Taking? Authorizing Provider   enoxaparin (LOVENOX) 60 mg/0.6 mL  injection 60 mg by SubCUTAneous route every twelve (12) hours.   Yes Phys Other, MD   warfarin (COUMADIN) 2 mg tablet Take 1 Tab by mouth nightly. 05/03/13  Yes Reita Cliche, MD   pantoprazole (PROTONIX) 40 mg tablet Take 1 Tab by mouth Before breakfast and dinner. 05/03/13  Yes Reita Cliche, MD   ondansetron (ZOFRAN ODT) 4 mg disintegrating tablet Take 1 Tab by mouth every eight (8) hours as needed for Nausea. 05/03/13  Yes Reita Cliche, MD   SUMAtriptan (IMITREX) 100 mg tablet Take 100 mg by mouth once as needed for Migraine.   Yes Historical Provider   oxyCODONE IR (OXY-IR) 15 mg immediate release tablet Take 1 Tab by mouth every six (6) hours as needed. Max Daily Amount: 60 mg. 05/03/13   Reita Cliche, MD     Allergies   Allergen Reactions   ??? Ativan [Lorazepam] Itching   ??? Codeine Rash   ??? Iodinated Contrast Media - Iv Dye Rash     States also has swelling, and allergic to shellfish   ??? Ketorolac Tromethamine Rash   ??? Motrin [Ibuprofen] Other (comments)     Causes stomach upset after 3 doses.   ??? Pcn [Penicillins] Hives   ??? Pneumococcal Vaccine Swelling   ??? Pneumovax 23 [Pneumococcal 23-Val Ps Vaccine] Other (comments)     Local reaction   ??? Shellfish Containing  Products Rash        Review of Systems:  A comprehensive review of systems was negative except for that written in the History of Present Illness.     Objective:     Visit Vitals   Item Reading   ??? BP 120/55   ??? Pulse 86   ??? Temp 98.2 ??F (36.8 ??C)   ??? Resp 16   ??? Ht 5' 4.5" (1.638 m)   ??? Wt 63.05 kg (139 lb)   ??? BMI 23.50 kg/m2   ??? SpO2 99%     Intake and Output:            Physical Exam:   General appearance: alert, cooperative, no distress, appears stated age  Head: Normocephalic, without obvious abnormality, atraumatic  Eyes: conjunctivae/corneas clear. PERRL, EOM's intact.  Ears: bleeding from left ear - no obvious lesions  Throat: Lips, mucosa, and tongue normal. Teeth and gums normal  Neck: supple, symmetrical, trachea midline, no adenopathy, thyroid: not enlarged, symmetric, no tenderness/mass/nodules, no carotid bruit and no JVD  Lungs: clear to auscultation bilaterally  Heart: regular rate and rhythm, S1, S2 normal  Abdomen: soft,tender at ecchymotic areas on lower abd where lovenox shots were given. Bowel sounds normal. No masses,  no organomegaly  Extremities: marked RUE swelling involving proximal arm to fingers.  No pain on passive movement of fingers.  Diminished radial pulse.  Bruising/ecchymoses of R arm especially posterior. Very sensitive to touch. Delayed capillary refill.   Lymph nodes: Cervical, supraclavicular, and axillary nodes normal.  Neurologic: Grossly normal  Psych: A+OX3    Data Review:   Recent Results (from the past 24 hour(s))   CBC WITH AUTOMATED DIFF    Collection Time     05/18/13 11:33 AM       Result Value Ref Range    WBC 5.4  4.3 - 11.1 K/uL    RBC 2.92 (*) 4.05 - 5.25 M/uL    HGB 8.8 (*) 11.7 - 15.4 g/dL    HCT 27.8 (*) 35.8 - 46.3 %    MCV  95.2  79.6 - 97.8 FL    MCH 30.1  26.1 - 32.9 PG    MCHC 31.7  31.4 - 35.0 g/dL    RDW 14.1  11.9 - 14.6 %    PLATELET 251  150 - 450 K/uL    MPV 10.3 (*) 10.8 - 14.1 FL    DF AUTOMATED      NEUTROPHILS 50  43 - 78 %    LYMPHOCYTES 25  13  - 44 %    MONOCYTES 13 (*) 4.0 - 12.0 %    EOSINOPHILS 11 (*) 0.5 - 7.8 %    BASOPHILS 1  0.0 - 2.0 %    IMMATURE GRANULOCYTES 0.2  0.0 - 5.0 %    ABS. NEUTROPHILS 2.7  1.7 - 8.2 K/UL    ABS. LYMPHOCYTES 1.3  0.5 - 4.6 K/UL    ABS. MONOCYTES 0.7  0.1 - 1.3 K/UL    ABS. EOSINOPHILS 0.6  0.0 - 0.8 K/UL    ABS. BASOPHILS 0.0  0.0 - 0.2 K/UL    ABS. IMM. GRANS. 0.0  0.0 - 0.5 K/UL   METABOLIC PANEL, COMPREHENSIVE    Collection Time     05/18/13 11:33 AM       Result Value Ref Range    Sodium 140  136 - 145 mmol/L    Potassium 4.3  3.5 - 5.1 mmol/L    Chloride 105  98 - 107 mmol/L    CO2 26  21 - 32 mmol/L    Anion gap 9  7 - 16 mmol/L    Glucose 93  65 - 100 mg/dL    BUN 12  6 - 23 MG/DL    Creatinine 0.80  0.6 - 1.0 MG/DL    GFR est AA >60  >60 ml/min/1.27m2    GFR est non-AA >60  >60 ml/min/1.49m2    Calcium 7.7 (*) 8.3 - 10.4 MG/DL    Bilirubin, total 0.2  0.2 - 1.1 MG/DL    ALT 22  12 - 65 U/L    AST 16  15 - 37 U/L    Alk. phosphatase 96  50 - 136 U/L    Protein, total 5.7 (*) 6.3 - 8.2 g/dL    Albumin 3.2 (*) 3.5 - 5.0 g/dL    Globulin 2.5  2.3 - 3.5 g/dL    A-G Ratio 1.3  1.2 - 3.5     PROTHROMBIN TIME + INR    Collection Time     05/18/13 11:33 AM       Result Value Ref Range    Prothrombin time 31.4 (*) 9.6 - 12.0 sec    INR 2.9 (*) 0.9 - 1.2     PTT    Collection Time     05/18/13 11:33 AM       Result Value Ref Range    aPTT >100.0 (*) 25.3 - 32.9 SEC   RETICULOCYTE COUNT    Collection Time     05/18/13 11:33 AM       Result Value Ref Range    Reticulocyte count 0.9  0.3 - 2.0 %    Absolute Retic Cnt. 0.0266  0.0164 - 0.0776 M/ul    Immature Retic Fraction 5.0  3.0 - 15.9 %    Retic Hgb Conc. 33  29 - 35 pg   D DIMER    Collection Time     05/18/13 11:33 AM       Result Value Ref Range  D DIMER 0.25  <0.55 ug/ml(FEU)   FIBRINOGEN    Collection Time     05/18/13 11:33 AM       Result Value Ref Range    Fibrinogen 271  202 - 416 mg/dL   HCG URINE, QL. - POC    Collection Time     05/18/13  1:08 PM        Result Value Ref Range    Pregnancy test,urine (POC) NEGATIVE   NEG     FFP, ALLOCATE    Collection Time     05/18/13  1:45 PM       Result Value Ref Range    Unit number [id number]      Blood component type FP24H,THAW      Unit division 00      Status of unit ISSUED     TYPE & SCREEN    Collection Time     05/18/13  1:50 PM       Result Value Ref Range    Crossmatch Expiration 05/21/2013      ABO/Rh(D) A POSITIVE      Antibody screen NEG       RUE [place]: Findings: Grayscale and color and spectral duplex imaging of the right upper  extremity venous structures was performed. The right internal jugular vein,  innominate vein, subclavian vein, axillary vein, brachial vein, and radial and  ulnar veins are patent. There is no right upper extremity venous thrombus. At  the area of swelling near the right humerus there is a 12.1 x 6.5 x 3.3 cm mixed  density structure most consistent with hematoma.  Impression: Negative for right upper extremity DVT. Large superficial hematoma.    Assessment:     RUE hematoma  Probable compartment syndrome  Coagulopathy  Factor V leiden/ Factor 2 deficiency  PUD  Anemia    Plan:     Admit DT. Stat ortho consult and hematology consults. Monitor hb and neurovascular symptoms.  Pain mgt.  Resume other meds but hold coumadin.  Report called to Dr Antony Haste L.    Signed By: Hulan Saas, MD     May 18, 2013

## 2013-05-18 NOTE — Progress Notes (Signed)
TRANSFER - IN REPORT:    Verbal report received from CarlsborgGlenn, RN on [place]  [facility] for routine [facility] of care      Report consisted of patient???s Situation, Background, Assessment and   Recommendations(SBAR).     Information from the following report(s) SBAR, Kardex, ED Summary, Intake/Output and Recent Results was reviewed with the receiving nurse.    Opportunity for questions and clarification was provided.      Assessment completed upon patient???s arrival to unit and care assumed.    Primary Nurse Bradly BienenstockBrooke E Donald, RN and Aundra MilletMegan, RN performed a dual skin assessment on this patient

## 2013-05-18 NOTE — ED Notes (Signed)
Waiting on Protamine.

## 2013-05-18 NOTE — ED Notes (Signed)
TRANSFER - OUT REPORT:    Verbal report given to Loura HaltJenn L. RN [facility] routine [facility] of care       Report consisted of patient???s Situation, Background, Assessment and   Recommendations(SBAR).     Information from the following report(s) ED Summary was reviewed with the receiving nurse.    Opportunity for questions and clarification was provided.

## 2013-05-18 NOTE — Progress Notes (Signed)
Medication side effects sheets provided and reviewed with patient. Patient also received an unit packet.

## 2013-05-18 NOTE — Consults (Signed)
Consult    CONSULT DATE - 05/18/13  CONSULTING PHYSICIAN - DR Genevie Ann    Subjective:     Krista Lopez is a 48 y.o. Caucasian female who is being seen for evaluation of compartment syndrome right upper extremity. Onset of symptoms was gradual with gradually worsening course since that time. The pain is located in the right [facility] space. Patient describes the pain as continuous and rated as moderate. Pain has been associated with active movement. Patient has history of Factor V.  She reports having labs drawn with a butterfly needle on Monday.  She said she developed a subcutaneous hematoma shortly after.  She also began experiencing bleeding from her nose, ear, and where she shaved her legs.  She also noticed blood in her urine.  She went to the ED for evaluation.  Ortho is consulted to evaluate for compartment syndrome.      Past Medical History   Diagnosis Date   ??? 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 Infectious disease 02/2009      Hx MRSA / E.Coli bacteremia, MRSA wound infection 2/2 port   ??? 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 Calculus of kidney    ??? Depression    ??? Iron deficiency    ??? Migraines    ??? 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 Bipolar disorder ([place])    ??? Anxiety    ??? Restless leg syndrome       Past Surgical History   Procedure Laterality Date   ??? Cystoscopy  ???     x 13   ??? Lithotripsy  2006   ??? Hx vascular access  July, 2010     Power Port placed in [place], [place] removed   ??? Hx vascular access Right 2013     pt has current rt subclavian port - placed at [facility]   ??? Hx appendectomy  ???   ??? Hx tonsillectomy     ??? Hx cesarean section     ??? Hx tubal ligation     ??? Hx ovarian cyst removal       on R     Family History   Problem Relation Age of Onset   ??? Bleeding Prob Father      Factor V   ??? Cancer Maternal Grandmother      metastatic breast CA.   ??? Ovarian Cancer Mother 54     passed away due to ca   ??? Bipolar Disorder Mother     ??? Kidney Disease Brother      kidney stones      History   Substance Use Topics   ??? Smoking status: Never Smoker    ??? Smokeless tobacco: Never Used   ??? Alcohol Use: Yes      Comment: rarely - only drinks 2 times per year       Current Facility-Administered Medications   Medication Dose Route Frequency Provider Last Rate Last Dose   ??? ondansetron (ZOFRAN ODT) tablet 4 mg  4 mg Oral Q8H PRN Hulan Saas, MD   4 mg at 05/19/13 8563   ??? oxyCODONE IR (OXY-IR) immediate release tablet 15 mg  15 mg Oral Q6H PRN Hulan Saas, MD       ??? pantoprazole (PROTONIX) tablet 40 mg  40 mg Oral ACB&D Hulan Saas, MD   40 mg at 05/19/13 1497   ???  SUMAtriptan (IMITREX) tablet 100 mg  100 mg Oral ONCE PRN Hulan Saas, MD       ??? acetaminophen (TYLENOL) tablet 650 mg  650 mg Oral Q4H PRN Hulan Saas, MD       ??? HYDROmorphone (PF) (DILAUDID) injection 1 mg  1 mg IntraVENous Q3H PRN Hulan Saas, MD   1 mg at 05/19/13 0620        Allergies   Allergen Reactions   ??? Ativan [Lorazepam] Itching   ??? Codeine Rash   ??? Iodinated Contrast Media - Iv Dye Rash     States also has swelling, and allergic to shellfish   ??? Ketorolac Tromethamine Rash   ??? Motrin [Ibuprofen] Other (comments)     Causes stomach upset after 3 doses.   ??? Pcn [Penicillins] Hives   ??? Pneumococcal Vaccine Swelling   ??? Pneumovax 23 [Pneumococcal 23-Val Ps Vaccine] Other (comments)     Local reaction   ??? Shellfish Containing Products Rash        Review of Systems:  A comprehensive review of systems was negative except for that written in the History of Present Illness.     Objective:     Intake and Output:            Physical Exam:   BP 93/50    Pulse 78    Temp(Src) 98.8 ??F (37.1 ??C)    Resp 16    SpO2 98%    LMP 07/28/2004     General appearance: alert, cooperative, no distress, appears stated age  Extremities: decreased ROM to the right upper extremity; she has a large subcutaneous hematoma to the anterior medial cubital space on the  right; is is tender but not hard; she has swelling to the right forearm; intact to light touch distally; no pain with passive ROM to right elbow and right digits; pain at the hematoma site.  Pulses: 2+ and symmetric  Skin: Skin color, texture, turgor normal. No rashes or lesions  Neurologic: Alert and oriented X 3, normal strength and tone. Normal symmetric reflexes. Normal coordination and gait    Data Review:   Recent Results (from the past 24 hour(s))   CBC WITH AUTOMATED DIFF    Collection Time     05/18/13 11:33 AM       Result Value Ref Range    WBC 5.4  4.3 - 11.1 K/uL    RBC 2.92 (*) 4.05 - 5.25 M/uL    HGB 8.8 (*) 11.7 - 15.4 g/dL    HCT 27.8 (*) 35.8 - 46.3 %    MCV 95.2  79.6 - 97.8 FL    MCH 30.1  26.1 - 32.9 PG    MCHC 31.7  31.4 - 35.0 g/dL    RDW 14.1  11.9 - 14.6 %    PLATELET 251  150 - 450 K/uL    MPV 10.3 (*) 10.8 - 14.1 FL    DF AUTOMATED      NEUTROPHILS 50  43 - 78 %    LYMPHOCYTES 25  13 - 44 %    MONOCYTES 13 (*) 4.0 - 12.0 %    EOSINOPHILS 11 (*) 0.5 - 7.8 %    BASOPHILS 1  0.0 - 2.0 %    IMMATURE GRANULOCYTES 0.2  0.0 - 5.0 %    ABS. NEUTROPHILS 2.7  1.7 - 8.2 K/UL    ABS. LYMPHOCYTES 1.3  0.5 - 4.6 K/UL    ABS. MONOCYTES 0.7  0.1 - 1.3 K/UL    ABS. EOSINOPHILS 0.6  0.0 - 0.8 K/UL    ABS. BASOPHILS 0.0  0.0 - 0.2 K/UL    ABS. IMM. GRANS. 0.0  0.0 - 0.5 K/UL   METABOLIC PANEL, COMPREHENSIVE    Collection Time     05/18/13 11:33 AM       Result Value Ref Range    Sodium 140  136 - 145 mmol/L    Potassium 4.3  3.5 - 5.1 mmol/L    Chloride 105  98 - 107 mmol/L    CO2 26  21 - 32 mmol/L    Anion gap 9  7 - 16 mmol/L    Glucose 93  65 - 100 mg/dL    BUN 12  6 - 23 MG/DL    Creatinine 0.80  0.6 - 1.0 MG/DL    GFR est AA >60  >60 ml/min/1.82m    GFR est non-AA >60  >60 ml/min/1.730m   Calcium 7.7 (*) 8.3 - 10.4 MG/DL    Bilirubin, total 0.2  0.2 - 1.1 MG/DL    ALT 22  12 - 65 U/L    AST 16  15 - 37 U/L    Alk. phosphatase 96  50 - 136 U/L    Protein, total 5.7 (*) 6.3 - 8.2 g/dL    Albumin 3.2  (*) 3.5 - 5.0 g/dL    Globulin 2.5  2.3 - 3.5 g/dL    A-G Ratio 1.3  1.2 - 3.5     PROTHROMBIN TIME + INR    Collection Time     05/18/13 11:33 AM       Result Value Ref Range    Prothrombin time 31.4 (*) 9.6 - 12.0 sec    INR 2.9 (*) 0.9 - 1.2     PTT    Collection Time     05/18/13 11:33 AM       Result Value Ref Range    aPTT >100.0 (*) 25.3 - 32.9 SEC   RETICULOCYTE COUNT    Collection Time     05/18/13 11:33 AM       Result Value Ref Range    Reticulocyte count 0.9  0.3 - 2.0 %    Absolute Retic Cnt. 0.0266  0.0164 - 0.0776 M/ul    Immature Retic Fraction 5.0  3.0 - 15.9 %    Retic Hgb Conc. 33  29 - 35 pg   D DIMER    Collection Time     05/18/13 11:33 AM       Result Value Ref Range    D DIMER 0.25  <0.55 ug/ml(FEU)   FIBRINOGEN    Collection Time     05/18/13 11:33 AM       Result Value Ref Range    Fibrinogen 271  202 - 416 mg/dL   HCG URINE, QL. - POC    Collection Time     05/18/13  1:08 PM       Result Value Ref Range    Pregnancy test,urine (POC) NEGATIVE   NEG     FFP, ALLOCATE    Collection Time     05/18/13  1:45 PM       Result Value Ref Range    Unit number [id number]    Blood component type FP24H,THAW      Unit division 00      Status of unit TRANSFUSED     TYPE & SCREEN  Collection Time     05/18/13  1:50 PM       Result Value Ref Range    Crossmatch Expiration 05/21/2013      ABO/Rh(D) A POSITIVE      Antibody screen NEG     PTT    Collection Time     05/18/13 10:25 PM       Result Value Ref Range    aPTT 33.4 (*) 23.5 - 31.7 SEC   PROTHROMBIN TIME + INR    Collection Time     05/18/13 10:25 PM       Result Value Ref Range    Prothrombin time 16.5 (*) 9.6 - 12.0 sec    INR 1.5 (*) 0.9 - 1.2     HEMOGLOBIN    Collection Time     05/18/13 10:25 PM       Result Value Ref Range    HGB 7.8 (*) 11.7 - 15.4 g/dL   PTT    Collection Time     05/19/13 12:30 AM       Result Value Ref Range    aPTT 37.1 (*) 23.5 - 31.7 SEC   HEMOGLOBIN    Collection Time     05/19/13 12:30 AM       Result Value Ref  Range    HGB 7.9 (*) 11.7 - 15.4 g/dL   PROTHROMBIN TIME + INR    Collection Time     05/19/13  5:24 AM       Result Value Ref Range    Prothrombin time 15.6 (*) 9.6 - 12.0 sec    INR 1.5 (*) 0.9 - 1.2         ULTRASOUND - At the area of swelling near the right humerus there is a 12.1 x 6.5 x 3.3 cm mixed  density structure most consistent with hematoma.    Assessment:     Active Problems:    * No active hospital problems. *      Plan:     THIS REPRESENTS A SUBCUTANEOUS HEMATOMA TO THE RIGHT ARM  NO SIGNS OR SYMPTOMS OF COMPARTMENT SYNDROME  THE RISK OF DEVELOPING COMPARTMENT SYNDROME IN THIS AREA IS LOW  COMPRESSIVE ACE WRAP  ELEVATION    Signed By: Trey Paula, MD     May 19, 2013

## 2013-05-18 NOTE — ED Notes (Signed)
Pt had blood draw for PT/INR on 6APR15. Pt placed on Lovenox in addition Coumadin 6APR15. Pt right arm began to swell 7APR15. Pt also has bleeding from nose and ears starting 7APR15.

## 2013-05-18 NOTE — ED Notes (Signed)
Pt had blood draw Mon 6APR15 from Right AC. Pt placed on Lovenox in addition to Coumadin to increase PT/INR. &APR15, pt began having swelling in right arm and bleeding from Left ear and nare.

## 2013-05-18 NOTE — ED Notes (Signed)
Ultrasound called regarding patient c/o itching from pain medication. We administered 25mg  Benadryl IV at time of pain medicine administration. I spoke with Dr Romualdo Bolkial and tried to call Ultrasound back to request they complete the study and we would reassess her upon her return to the ER.

## 2013-05-19 LAB — PROTHROMBIN TIME + INR
INR: 1.5 — ABNORMAL HIGH (ref 0.9–1.2)
INR: 1.5 — ABNORMAL HIGH (ref 0.9–1.2)
Prothrombin time: 16.5 s — ABNORMAL HIGH (ref 9.6–12.0)
Prothrombin time: 15.6 s — ABNORMAL HIGH (ref 9.6–12.0)

## 2013-05-19 LAB — PLASMA, ALLOCATE
Status of unit: TRANSFUSED
Unit division: 0

## 2013-05-19 LAB — MSSA/MRSA SC BY PCR, NASAL SWAB

## 2013-05-19 LAB — PTT
aPTT: 33.4 s — ABNORMAL HIGH (ref 23.5–31.7)
aPTT: 37.1 s — ABNORMAL HIGH (ref 23.5–31.7)
aPTT: 34.1 s — ABNORMAL HIGH (ref 23.5–31.7)

## 2013-05-19 LAB — HEMOGLOBIN
HGB: 7.8 g/dL — ABNORMAL LOW (ref 11.7–15.4)
HGB: 7.9 g/dL — ABNORMAL LOW (ref 11.7–15.4)
HGB: 7.8 g/dL — ABNORMAL LOW (ref 11.7–15.4)

## 2013-05-19 MED ADMIN — HYDROmorphone (PF) (DILAUDID) injection 1 mg: INTRAVENOUS | @ 14:00:00 | NDC 00409255201

## 2013-05-19 MED ADMIN — HYDROmorphone (PF) (DILAUDID) injection 1 mg: INTRAVENOUS | @ 07:00:00 | NDC 00409255201

## 2013-05-19 MED ADMIN — pantoprazole (PROTONIX) tablet 40 mg: ORAL | @ 21:00:00 | NDC 51079005101

## 2013-05-19 MED ADMIN — ondansetron (ZOFRAN ODT) tablet 4 mg: ORAL | @ 03:00:00 | NDC 68001024616

## 2013-05-19 MED ADMIN — acetaminophen (TYLENOL) tablet 650 mg: ORAL | @ 17:00:00 | NDC 50580050110

## 2013-05-19 MED ADMIN — HYDROmorphone (PF) (DILAUDID) injection 1 mg: INTRAVENOUS | @ 18:00:00 | NDC 00409255201

## 2013-05-19 MED ADMIN — ondansetron (ZOFRAN ODT) tablet 4 mg: ORAL | @ 10:00:00 | NDC 68001024616

## 2013-05-19 MED ADMIN — oxyCODONE IR (OXY-IR) immediate release tablet 15 mg: ORAL | @ 13:00:00 | NDC 68084018411

## 2013-05-19 MED ADMIN — acetaminophen (TYLENOL) tablet 650 mg: ORAL | @ 13:00:00 | NDC 50580050110

## 2013-05-19 MED ADMIN — HYDROmorphone (PF) (DILAUDID) injection 1 mg: INTRAVENOUS | @ 03:00:00 | NDC 00409255201

## 2013-05-19 MED ADMIN — HYDROmorphone (PF) (DILAUDID) injection 1 mg: INTRAVENOUS | @ 22:00:00 | NDC 00409255201

## 2013-05-19 MED ADMIN — HYDROmorphone (PF) (DILAUDID) injection 1 mg: INTRAVENOUS | @ 10:00:00 | NDC 00409255201

## 2013-05-19 MED ADMIN — ondansetron (ZOFRAN ODT) tablet 4 mg: ORAL | @ 14:00:00 | NDC 68001024616

## 2013-05-19 MED ADMIN — oxyCODONE IR (OXY-IR) immediate release tablet 15 mg: ORAL | @ 17:00:00 | NDC 68084018411

## 2013-05-19 MED ADMIN — oxyCODONE IR (OXY-IR) immediate release tablet 15 mg: ORAL | @ 21:00:00 | NDC 68084018411

## 2013-05-19 MED ADMIN — pantoprazole (PROTONIX) tablet 40 mg: ORAL | @ 11:00:00 | NDC 51079005101

## 2013-05-19 MED FILL — HYDROMORPHONE (PF) 1 MG/ML IJ SOLN: 1 mg/mL | INTRAMUSCULAR | Qty: 1

## 2013-05-19 MED FILL — ONDANSETRON 4 MG TAB, RAPID DISSOLVE: 4 mg | ORAL | Qty: 1

## 2013-05-19 MED FILL — TYLENOL 325 MG TABLET: 325 mg | ORAL | Qty: 2

## 2013-05-19 MED FILL — PANTOPRAZOLE 40 MG TAB, DELAYED RELEASE: 40 mg | ORAL | Qty: 1

## 2013-05-19 MED FILL — OXYCODONE 15 MG TAB: 15 mg | ORAL | Qty: 1

## 2013-05-19 NOTE — Consults (Addendum)
Inpatient Hematology / Oncology Consult Note    Reason for Consult:  VASCULAR COMPROMISE  R  ARM  Hematoma  Referring Physician:  Salome ArntScott J Perlman, MD    History of Present Illness:  [name] is a 48 y.o. female admitted on 05/18/2013 with a primary diagnosis of VASCULAR COMPROMISE  R  ARM, Hematoma.      Caucasian female who presented with left upper extremity pain and swelling. States she went in for INR check on Monday and had venipuncture done w/ butterfly RAC. INR was decreased at 1.9 and her coumadin dose was increased and she was started on a lovenox bridge. She received 2 doses of lovenox with the last being Tuesday am. On Tuesday her RUE began to have significant edema and pain and she went to the ER for evaluation.   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 Pt also has IVC filter. Last Factor V leiden mutation check was in 2010 and was negative for the mutation. 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 US of the UE showed hematoma but no DVT and striker needle eval per ER MD was c/w compartment syndrome in posterior compartment of the forearm. Consultation was made with Dr. Suzi RootsBoyle who deferred to hospitalist due to her coagulopathy and since it was felt her current findings were not c/w compartment syndrome and more pain from her hematoma. Compression wrap has been applied per Orthopedics.   Pt reports she was having bleeding from L ear, L nare as well as blood in the urine. INR in Er was 2.9 and PTT> 100 - he was given FFP and protamine. Current INR is 1.5, current hgb is 7.8 (8.8 on admission).  She is followed closely by [facility]Walls at [facility].       Review of Systems:  Constitutional Denies fever, chills, weight loss, appetite changes, fatigue, night sweats.   HEENT Denies trauma, blurry vision, hearing loss, ear pain, sore throat, neck pain and ear discharge. + bleeding from L nare and L ear.    Skin  Denies lesions or rashes.   Lungs Denies dyspnea, cough, sputum production or hemoptysis.   Cardiovascular Denies chest pain, palpitations, or lower extremity edema.   Gastrointestinal Denies nausea, vomiting, changes in bowel habits, bloody or black stools, abdominal pain.   GU Denies dysuria, frequency or hesitancy of urination.   Neuro Denies headaches, visual changes or ataxia. Denies dizziness, tingling, tremors, sensory change, speech change, focal weakness or headaches.   Hematology Denies easy bruising or bleeding, denies gingival bleeding or epistaxis.   Endo Denies heat/cold intolerance, denies diabetes or thyroid abnormalities.    MSK Denies back pain, arthralgias, myalgias or frequent falls.   + RUE edema /pain   Psychiatric/Behavioral Denies depression and substance abuse. The patient is not nervous/anxious. + bipolar       Allergies   Allergen Reactions   ??? Ativan [Lorazepam] Itching   ??? Codeine Rash   ??? Iodinated Contrast Media - Iv Dye Rash     States also has swelling, and allergic to shellfish   ??? Ketorolac Tromethamine Rash   ??? Motrin [Ibuprofen] Other (comments)     Causes stomach upset after 3 doses.   ??? Pcn [Penicillins] Hives   ??? Pneumococcal Vaccine Swelling   ??? Pneumovax 23 [Pneumococcal 23-Val Ps Vaccine] Other (comments)     Local reaction   ??? Shellfish Containing Products Rash  Past Medical History   Diagnosis Date   ??? Factor V Leiden mutation (HCC)    ??? Infectious disease 02/2009      Hx MRSA / E.Coli bacteremia, MRSA wound infection 2/2 port   ??? Factor II deficiency (HCC)    ??? Calculus of kidney    ??? Depression    ??? Iron deficiency    ??? Migraines    ??? 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 Bipolar disorder (HCC)    ??? Anxiety    ??? Restless leg syndrome      Past Surgical History   Procedure Laterality Date   ??? Cystoscopy  ???     x 13   ??? Lithotripsy  2006   ??? Hx vascular access  July, 2010     Power Port placed in [place], [place].,  removed   ??? Hx vascular access Right 2013     pt has current rt subclavian port - placed at [facility]   ??? Hx appendectomy  ???   ??? Hx tonsillectomy     ??? Hx cesarean section     ??? Hx tubal ligation     ??? Hx ovarian cyst removal       on R     Family History   Problem Relation Age of Onset   ??? Bleeding Prob Father      Factor V   ??? Cancer Maternal Grandmother      metastatic breast CA.   ??? Ovarian Cancer Mother 53     passed away due to ca   ??? Bipolar Disorder Mother    ??? Kidney Disease Brother      kidney stones     History     Social History   ??? Marital Status: DIVORCED     Spouse Name: N/A     Number of Children: N/A   ??? Years of Education: N/A     Occupational History   ??? Not on file.     Social History Main Topics   ??? Smoking status: Never Smoker    ??? Smokeless tobacco: Never Used   ??? Alcohol Use: Yes      Comment: rarely - only drinks 2 times per year   ??? Drug Use: No      Comment: narcotic seeking per hospital hx.   ??? Sexual Activity:     Partners: Male     Birth Control/ Protection: Surgical     Other Topics Concern   ??? Not on file     Social History Narrative     Current Facility-Administered Medications   Medication Dose Route Frequency Provider Last Rate Last Dose   ??? ondansetron (ZOFRAN ODT) tablet 4 mg  4 mg Oral Q8H PRN Ferdinand Cava, MD   4 mg at 05/19/13 1006   ??? oxyCODONE IR (OXY-IR) immediate release tablet 15 mg  15 mg Oral Q6H PRN Ferdinand Cava, MD   15 mg at 05/19/13 1320   ??? pantoprazole (PROTONIX) tablet 40 mg  40 mg Oral ACB&D Ferdinand Cava, MD   40 mg at 05/19/13 1610   ??? SUMAtriptan (IMITREX) tablet 100 mg  100 mg Oral ONCE PRN Ferdinand Cava, MD       ??? acetaminophen (TYLENOL) tablet 650 mg  650 mg Oral Q4H PRN Ferdinand Cava, MD   650 mg at 05/19/13 1320   ??? HYDROmorphone (PF) (DILAUDID)  injection 1 mg  1 mg IntraVENous Q3H PRN Ferdinand Cava, MD   1 mg at 05/19/13 1417       OBJECTIVE:  Patient Vitals for the past 8 hrs:   BP Temp Pulse Resp SpO2    05/19/13 1540 100/57 mmHg - 89 16 97 %   05/19/13 1151 104/60 mmHg 98.5 ??F (36.9 ??C) 94 16 98 %     Temp (24hrs), Avg:98.2 ??F (36.8 ??C), Min:97.4 ??F (36.3 ??C), Max:98.8 ??F (37.1 ??C)         Physical Exam:  Constitutional: Well developed, well nourished female in no acute distress, sitting in the hospital bed.    HEENT: Normocephalic and atraumatic. Oropharynx is clear, mucous membranes are moist.  Pupils are equal, round, and reactive to light. Extraocular muscles are intact.  Sclerae anicteric. Neck supple    Lymph node   No palpable submandibular, cervical, supraclavicular lymph nodes.   Skin Warm and dry.  Scattered ecchymosis across abd from lovenox injections. no rash noted.  No erythema.  No pallor.    Respiratory Lungs are clear to auscultation bilaterally without wheezes, rales or rhonchi, normal air exchange without accessory muscle use.    CVS Normal rate, regular rhythm and normal S1 and S2.  No murmurs, gallops, or rubs.   Abdomen Soft, tender at lovenox injection sites and nondistended, normoactive bowel sounds.  No palpable mass.     Neuro Grossly nonfocal with no obvious sensory or motor deficits.   MSK Normal range of motion in general.  RUE 2-3+ from hand up into axilla edema and no tenderness.   Psych Appropriate mood and affect.        Labs:    Recent Results (from the past 24 hour(s))   PTT    Collection Time     05/18/13 10:25 PM       Result Value Ref Range    aPTT 33.4 (*) 23.5 - 31.7 SEC   PROTHROMBIN TIME + INR    Collection Time     05/18/13 10:25 PM       Result Value Ref Range    Prothrombin time 16.5 (*) 9.6 - 12.0 sec    INR 1.5 (*) 0.9 - 1.2     HEMOGLOBIN    Collection Time     05/18/13 10:25 PM       Result Value Ref Range    HGB 7.8 (*) 11.7 - 15.4 g/dL   PTT    Collection Time     05/19/13 12:30 AM       Result Value Ref Range    aPTT 37.1 (*) 23.5 - 31.7 SEC   HEMOGLOBIN    Collection Time     05/19/13 12:30 AM       Result Value Ref Range    HGB 7.9 (*) 11.7 - 15.4 g/dL    PROTHROMBIN TIME + INR    Collection Time     05/19/13  5:24 AM       Result Value Ref Range    Prothrombin time 15.6 (*) 9.6 - 12.0 sec    INR 1.5 (*) 0.9 - 1.2     PTT    Collection Time     05/19/13 10:45 AM       Result Value Ref Range    aPTT 34.1 (*) 23.5 - 31.7 SEC   HEMOGLOBIN    Collection Time     05/19/13 10:45 AM       Result Value  Ref Range    HGB 7.8 (*) 11.7 - 15.4 g/dL   MSSA/MRSA SC BY PCR, NASAL SWAB    Collection Time     05/19/13 10:45 AM       Result Value Ref Range    Special Requests: NO SPECIAL REQUESTS      Culture result:        Value: MRSA target DNA not detected, SA target DNA detected.   A MRSA negative, SA positive test result does not preclude MRSA nasal colonization.       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 test showed positivity for one copy of the G20210A Prothrombin mutation           Abdominal pain 789.00  05/01/2013         Pulmonary embolism, 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  Venous Doppler 03/24/2012 [facility] - no DVT           Depression (Chronic) 311  04/26/2013         Anxiety (Chronic) 300.00  04/26/2013         Bipolar 2 disorder (HCC) (Chronic) 296.89  04/26/2013         Migraine (Chronic) 346.90  04/26/2013         Iron deficiency anemia (Chronic) 280.9  04/26/2013         Factor V Leiden mutation [facility]) (Chronic) 289.81  04/26/2013    Overview     Signed 05/02/2013  4:49 PM by Delene Ruffini, MD     Patient does not have Factor V leiden mutation. Testing at [facility] and also here in [facility] in 2010 are negative for Factor V leiden mutation           Factor II deficiency (HCC) (Chronic) 286.3  04/26/2013    Overview     Signed 05/02/2013  4:50 PM by Delene Ruffini, MD     Does nto have Factor II deficiency           Coagulopathy (HCC) 286.9  04/26/2013         Epistaxis 784.7  04/26/2013         Melena 578.1  04/26/2013         Acute blood loss anemia 285.1  04/26/2013         Supratherapeutic INR 790.92  01/19/2013    Overview     Signed 01/19/2013  9:44 PM by Lynne Logan, MD     INR>6.8. Apparently unable to quantify per ER. 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   More of a risk for bleed at this time given significant RUE hematoma  Recs:  Hold anticoagulation until hematoma resolves  Would monitor CBC daily for indications of bleeding - transfuse for Hgb < 7.0  No contraindication to restarting meds for bipolar d/o - pt very concerned about missing doses.   Pt is followed by Dr. Anola Gurney at [facility].   Lab studies and imaging studies results were personally reviewed.   Case discussed with Dr. Lyn Hollingshead.    Thank you for allowing [place] to participate in the care of Ms. Lasater.            Clovis Riley, NP  [facility]  [address]  [place] [address]  Office : [phone]  Fax : [phone]      Attending Addendum:  I personally evaluated the patient with Gearldine Bienenstock, N.P.,  and agree with the assessment, findings and plan as documented.  Appears well, heart regular without murmur, lungs  clear, abdomen benign.  Right arm edematous.  48 y/o with history of multiple PEs, Prothrombin gene mutation (heterozygous) but NO history of FVL as previously documented.  She is on chronic Coumadin followed by Dr. Anola Gurney, she was seen in their clinic on Monday and noted to have an INR of 1.9 and was placed on Lovenox to bridge.  She also had labs drawn from the right [facility] at that visit.  Afterward, she developed progressive swelling of the RUE and was found to have a large hematoma at the site (but no DVT).  INR was 2.9 so she was given FFP and protamine for a PTT of greater than 100.  She did not require intervention for the hematoma.  Recommend continuing observation without resuming anticoagulation given the extent of the bleeding.  I would not issue any further reversal agents at this point.    Thank you for the consultation.  We will arrange follow-up with Dr. Anola Gurney at discharge.            Sherlene Shams., MD  [facility]  [address]  [place] [address]  Office : [phone]  Fax : [phone]

## 2013-05-19 NOTE — Progress Notes (Signed)
Hospitalist Progress Note    Subjective:   Daily Progress Note: 05/19/2013 7:36 PM    Krista Lopez is a 48 yo woman admitted with a large hematoma of her RUE and warfarin induced coagulopathy.  She has h/o recurent PEs.  She has a prothrombin gene mutation (heterozygous) but NO history of FVL as previously documented.  She was seen by ortho who said she does not have compartment syndrome and won't need any surgery.  Hematology has seen her and recommends no anticoagulation and monitor Hb.  Pt c/o RUE pain.  She thinks there is more swelling and the hematoma has spread.       Current Facility-Administered Medications   Medication Dose Route Frequency   ??? ondansetron (ZOFRAN ODT) tablet 4 mg  4 mg Oral Q8H PRN   ??? oxyCODONE IR (OXY-IR) immediate release tablet 15 mg  15 mg Oral Q6H PRN   ??? pantoprazole (PROTONIX) tablet 40 mg  40 mg Oral ACB&D   ??? SUMAtriptan (IMITREX) tablet 100 mg  100 mg Oral ONCE PRN   ??? acetaminophen (TYLENOL) tablet 650 mg  650 mg Oral Q4H PRN   ??? HYDROmorphone (PF) (DILAUDID) injection 1 mg  1 mg IntraVENous Q3H PRN            Objective:     BP 115/56    Pulse 90    Temp(Src) 98.5 ??F (36.9 ??C)    Resp 16    SpO2 95%    LMP 07/28/2004    O2 Flow Rate (L/min):  (weaned off 2 liters) O2 Device: Room air    Temp (24hrs), Avg:98.2 ??F (36.8 ??C), Min:97.4 ??F (36.3 ??C), Max:98.8 ??F (37.1 ??C)              General appearance: alert, cooperative, no distress, appears stated age  Head: Normocephalic, without obvious abnormality, atraumatic  Neck: supple, symmetrical, trachea midline and no JVD  Lungs: clear to auscultation bilaterally  Heart: regular rate and rhythm, S1, S2 normal, no murmur, click, rub or gallop  Abdomen: soft, non-tender. Bowel sounds normal.   Extremities: RUE with pressure wrap.  Proximal to pressure wrap, the hematoma extends to her axilla.  Fingers are warm.  Good capillary refill.     Additional comments:I reviewed the patient's new clinical lab test results.   and I reviewed the  patient's new imaging test results.      Data Review    Recent Results (from the past 24 hour(s))   PTT    Collection Time     05/18/13 10:25 PM       Result Value Ref Range    aPTT 33.4 (*) 23.5 - 31.7 SEC   PROTHROMBIN TIME + INR    Collection Time     05/18/13 10:25 PM       Result Value Ref Range    Prothrombin time 16.5 (*) 9.6 - 12.0 sec    INR 1.5 (*) 0.9 - 1.2     HEMOGLOBIN    Collection Time     05/18/13 10:25 PM       Result Value Ref Range    HGB 7.8 (*) 11.7 - 15.4 g/dL   PTT    Collection Time     05/19/13 12:30 AM       Result Value Ref Range    aPTT 37.1 (*) 23.5 - 31.7 SEC   HEMOGLOBIN    Collection Time     05/19/13 12:30 AM  Result Value Ref Range    HGB 7.9 (*) 11.7 - 15.4 g/dL   PROTHROMBIN TIME + INR    Collection Time     05/19/13  5:24 AM       Result Value Ref Range    Prothrombin time 15.6 (*) 9.6 - 12.0 sec    INR 1.5 (*) 0.9 - 1.2     PTT    Collection Time     05/19/13 10:45 AM       Result Value Ref Range    aPTT 34.1 (*) 23.5 - 31.7 SEC   HEMOGLOBIN    Collection Time     05/19/13 10:45 AM       Result Value Ref Range    HGB 7.8 (*) 11.7 - 15.4 g/dL   MSSA/MRSA SC BY PCR, NASAL SWAB    Collection Time     05/19/13 10:45 AM       Result Value Ref Range    Special Requests: NO SPECIAL REQUESTS      Culture result:        Value: MRSA target DNA not detected, SA target DNA detected.   A MRSA negative, SA positive test result does not preclude MRSA nasal colonization.         Assessment/Plan:     Principal Problem:    Hematoma (05/18/2013)      Overview: RUE hematoma    Active Problems:    Bipolar 2 disorder (HCC) (04/26/2013)      Acute blood loss anemia (04/26/2013)      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HCC) (05/19/2013)      Plan:  No surgery needed  No anticoagulation  Elevate are  Pain control  Follow Hb.  Transfuse for Hb<7  Reassured pt about the changes with hematoma.  Explained to anticipate evolving hematoma changes.      Care Plan discussed with: Patient    Total time spent with  patient: 20 minutes.    Salome Arnt, MD

## 2013-05-19 NOTE — Progress Notes (Signed)
New consult  For heparin allergy called to Erby Pian Smith NP

## 2013-05-20 LAB — HEMOGLOBIN
HGB: 8 g/dL — ABNORMAL LOW (ref 11.7–15.4)
HGB: 8.1 g/dL — ABNORMAL LOW (ref 11.7–15.4)

## 2013-05-20 LAB — PROTHROMBIN TIME + INR
INR: 1.2 (ref 0.9–1.2)
Prothrombin time: 12.8 s — ABNORMAL HIGH (ref 9.6–12.0)

## 2013-05-20 MED ADMIN — clonazePAM (KlonoPIN) tablet 1 mg: ORAL | @ 20:00:00 | NDC 63739026310

## 2013-05-20 MED ADMIN — HYDROmorphone (PF) (DILAUDID) injection 1 mg: INTRAVENOUS | @ 14:00:00 | NDC 00409255201

## 2013-05-20 MED ADMIN — HYDROmorphone (PF) (DILAUDID) injection 1 mg: INTRAVENOUS | @ 01:00:00 | NDC 00409255201

## 2013-05-20 MED ADMIN — ondansetron (ZOFRAN ODT) tablet 4 mg: ORAL | @ 14:00:00 | NDC 68001024616

## 2013-05-20 MED ADMIN — ondansetron (ZOFRAN ODT) tablet 4 mg: ORAL | @ 07:00:00 | NDC 68462015740

## 2013-05-20 MED ADMIN — HYDROmorphone (PF) (DILAUDID) injection 1 mg: INTRAVENOUS | @ 20:00:00 | NDC 00409255201

## 2013-05-20 MED ADMIN — oxyCODONE IR (OXY-IR) immediate release tablet 15 mg: ORAL | @ 09:00:00 | NDC 68084018411

## 2013-05-20 MED ADMIN — HYDROmorphone (PF) (DILAUDID) injection 1 mg: INTRAVENOUS | @ 11:00:00 | NDC 00409255201

## 2013-05-20 MED ADMIN — pantoprazole (PROTONIX) tablet 40 mg: ORAL | @ 11:00:00 | NDC 51079005101

## 2013-05-20 MED ADMIN — DULoxetine (CYMBALTA) capsule 90 mg: ORAL | @ 20:00:00 | NDC 68084069211

## 2013-05-20 MED ADMIN — HYDROmorphone (PF) (DILAUDID) injection 1 mg: INTRAVENOUS | @ 07:00:00 | NDC 00409255201

## 2013-05-20 MED ADMIN — HYDROmorphone (PF) (DILAUDID) injection 1 mg: INTRAVENOUS | @ 17:00:00 | NDC 00409255201

## 2013-05-20 MED ADMIN — pantoprazole (PROTONIX) tablet 40 mg: ORAL | @ 20:00:00 | NDC 51079005101

## 2013-05-20 MED ADMIN — ondansetron (ZOFRAN ODT) tablet 4 mg: ORAL | @ 17:00:00 | NDC 68001024616

## 2013-05-20 MED ADMIN — HYDROmorphone (PF) (DILAUDID) injection 1 mg: INTRAVENOUS | @ 04:00:00 | NDC 00409255201

## 2013-05-20 MED ADMIN — acetaminophen (TYLENOL) tablet 650 mg: ORAL | @ 14:00:00 | NDC 50580050110

## 2013-05-20 MED ADMIN — oxyCODONE IR (OXY-IR) immediate release tablet 15 mg: ORAL | @ 17:00:00 | NDC 68084018411

## 2013-05-20 MED ADMIN — oxyCODONE IR (OXY-IR) immediate release tablet 15 mg: ORAL | @ 11:00:00 | NDC 68084018411

## 2013-05-20 MED ADMIN — oxyCODONE IR (OXY-IR) immediate release tablet 15 mg: ORAL | @ 03:00:00 | NDC 68084018411

## 2013-05-20 MED FILL — PANTOPRAZOLE 40 MG TAB, DELAYED RELEASE: 40 mg | ORAL | Qty: 1

## 2013-05-20 MED FILL — CLONAZEPAM 0.5 MG TAB: 0.5 mg | ORAL | Qty: 2

## 2013-05-20 MED FILL — ONDANSETRON 4 MG TAB, RAPID DISSOLVE: 4 mg | ORAL | Qty: 1

## 2013-05-20 MED FILL — HYDROMORPHONE (PF) 1 MG/ML IJ SOLN: 1 mg/mL | INTRAMUSCULAR | Qty: 1

## 2013-05-20 MED FILL — OXYCODONE 15 MG TAB: 15 mg | ORAL | Qty: 1

## 2013-05-20 MED FILL — DULOXETINE 60 MG CAP, DELAYED RELEASE: 60 mg | ORAL | Qty: 1

## 2013-05-20 MED FILL — TYLENOL 325 MG TABLET: 325 mg | ORAL | Qty: 2

## 2013-05-20 NOTE — Progress Notes (Addendum)
Inpatient Hematology / Oncology Progress Note      Admission Date: 05/18/2013  7:21 PM  Reason for Admission/Hospital Course: VASCULAR COMPROMISE  R  ARM  Hematoma      24 Hour Events:  No significant events overnight  Pt still w/ significant pain RUE  Anxious about getting psych meds restarted      ROS:  Constitutional: negative for fever, chills, weakness, malaise, fatigue.  CV: Positive for RUE edema - stable; negative for chest pain, palpitations.  Respiratory: negative for dyspnea, cough, wheezing.  GI: negative for nausea, abdominal pain, diarrhea.    10 point review of systems is otherwise negative with the exception of the elements mentioned above in the HPI.     Allergies   Allergen Reactions   ??? Ativan [Lorazepam] Itching   ??? Codeine Rash   ??? Iodinated Contrast Media - Iv Dye Rash     States also has swelling, and allergic to shellfish   ??? Ketorolac Tromethamine Rash   ??? Motrin [Ibuprofen] Other (comments)     Causes stomach upset after 3 doses.   ??? Pcn [Penicillins] Hives   ??? Pneumococcal Vaccine Swelling   ??? Pneumovax 23 [Pneumococcal 23-Val Ps Vaccine] Other (comments)     Local reaction   ??? Shellfish Containing Products Rash       OBJECTIVE:  Patient Vitals for the past 8 hrs:   BP Temp Pulse Resp SpO2   05/20/13 1224 114/58 mmHg 98.1 ??F (36.7 ??C) 93 18 97 %   05/20/13 0748 100/64 mmHg 98.6 ??F (37 ??C) 86 16 93 %     Temp (24hrs), Avg:98.3 ??F (36.8 ??C), Min:98.1 ??F (36.7 ??C), Max:98.6 ??F (37 ??C)    04/10 0700 - 04/10 1859  In: 240 [P.O.:240]  Out: -     Physical Exam:  Constitutional:  Well developed, well nourished female in no acute distress, sitting in the hospital bed.    HEENT:  Normocephalic and atraumatic. Oropharynx is clear, mucous membranes are moist. Pupils are equal, round, and reactive to light. Extraocular muscles are intact. Sclerae anicteric. Neck supple    Lymph node No palpable submandibular, cervical, supraclavicular lymph nodes.    Skin  Warm and dry. Scattered ecchymosis  across abd from lovenox injections. no rash noted. No erythema. No pallor.    Respiratory  Lungs are clear to auscultation bilaterally without wheezes, rales or rhonchi, normal air exchange without accessory muscle use.    CVS  Normal rate, regular rhythm and normal S1 and S2. No murmurs, gallops, or rubs.    Abdomen  Soft, tender at lovenox injection sites and nondistended, normoactive bowel sounds. No palpable mass.    Neuro  Grossly nonfocal with no obvious sensory or motor deficits.    MSK  Normal range of motion in general. RUE 2-3+ from hand up into axilla edema and no tenderness.    Psych  Appropriate mood and affect       Labs:    Recent Labs      05/20/13   0450  05/19/13   1045  05/19/13   0030   05/18/13   1133   WBC   --    --    --    --   5.4   RBC   --    --    --    --   2.92*   HGB  8.0*  7.8*  7.9*   < >  8.8*   HCT   --    --    --    --  27.8*   MCV   --    --    --    --   95.2   MCH   --    --    --    --   30.1   MCHC   --    --    --    --   31.7   RDW   --    --    --    --   14.1   PLT   --    --    --    --   251   GRANS   --    --    --    --   50   LYMPH   --    --    --    --   25   MONOS   --    --    --    --   13*   EOS   --    --    --    --   11*   BASOS   --    --    --    --   1   IG   --    --    --    --   0.2   DF   --    --    --    --   AUTOMATED   ANEU   --    --    --    --   2.7   ABL   --    --    --    --   1.3   ABM   --    --    --    --   0.7   ABE   --    --    --    --   0.6   ABB   --    --    --    --   0.0   AIG   --    --    --    --   0.0    < > = values in this interval not displayed.      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 Apparently unable to quantify per ER. Specimen had to be sent to [facility]           Personal history of PE (pulmonary embolism), with Factor V mutation V12.55  09/22/2009         Restless leg syndrome (Chronic) 333.94  09/21/2009         Patient  noncompliance (Chronic) V15.81  10/04/2008            PLAN:  Cont to hold anticoagulation. Hgb stable but monitor for additional bleeding  F/U w/in 1 week w/ Dr. Anola Gurney to discuss resuming anticoagulation.    Thank you for allowing [place] to participate in the care of Ms. Mase. Will sign off. Please call for any further needs              Clovis Riley, NP  [facility]  [address], [address]  [place], [place] [address]  Office : [phone]  Fax : [phone]      Attending Addendum:  Delayed entry for 4/1015I personally evaluated the patient with Gearldine Bienenstock, N.P.,  and agree with the assessment, findings and plan as documented.  Appears well, heart regular without murmur, lungs clear, abdomen benign, right arm with ongoing edema.  Hold anticoagulation with  resolving hematoma.  INR 1.2, no indication for repeat reversal agent.  [facility]  We will sign off, call for any further questions.            Sherlene ShamsStephen H Dyar Jr., MD  [facility]  [address]  [place] [id number]  Office : [phone]  Fax : [phone]

## 2013-05-20 NOTE — Progress Notes (Deleted)
Hospitalist Progress Note    05/20/2013  3:02 PM  Admit Date: 05/18/2013  7:21 PM   NAME: Krista Lopez   DOB:  1965/11/23   MRN:  [id number]   Attending: Salome Arnt, MD  PCP:  Irma Newness, DO  Treatment Team: Attending Provider: Salome Arnt, MD; Consulting Provider: Overton Mam, MD; Care Manager: Edd Fabian, LMSW  SUBJECTIVE:   Patient is a 48 yo WF with PMH of factor V Leiden deficiency and prothrombin II deficiency (prior PE/IVC filter) followed by Dr. Daleen Squibb of [facility] who has sub-therapeutic INR and was placed on lovenox bridge in addition to her chronic coumadin with resultant right UE hematoma. There was concern for compartment syndome in [facility] ED and she was transferred to [facility] for orthoe val. Dr. Suzi Roots has evaluated and feels that she has hematoma that needs elevation/ ace wrap. She was given FFP/ protamine.  US shows large superficial hematoma without DVT. Hematology has evaluated and all anticoagulation has been held until she makes a follow up with Dr. Anola Gurney. She is currently limited by pain in UE. She would like to resume her chronic meds for depression.         Recent Results (from the past 24 hour(s))   PROTHROMBIN TIME + INR    Collection Time     05/20/13  4:50 AM       Result Value Ref Range    Prothrombin time 12.8 (*) 9.6 - 12.0 sec    INR 1.2  0.9 - 1.2     HEMOGLOBIN    Collection Time     05/20/13  4:50 AM       Result Value Ref Range    HGB 8.0 (*) 11.7 - 15.4 g/dL       Allergies   Allergen Reactions   ??? Ativan [Lorazepam] Itching   ??? Codeine Rash   ??? Iodinated Contrast Media - Iv Dye Rash     States also has swelling, and allergic to shellfish   ??? Ketorolac Tromethamine Rash   ??? Motrin [Ibuprofen] Other (comments)     Causes stomach upset after 3 doses.   ??? Pcn [Penicillins] Hives   ??? Pneumococcal Vaccine Swelling   ??? Pneumovax 23 [Pneumococcal 23-Val Ps Vaccine] Other (comments)     Local reaction   ??? Shellfish Containing Products Rash     Current  Facility-Administered Medications   Medication Dose Route Frequency Provider Last Rate Last Dose   ??? clonazePAM (KlonoPIN) tablet 1 mg  1 mg Oral TID Jacqlyn Larsen, MD       ??? [START ON 05/21/2013] DULoxetine (CYMBALTA) capsule 90 mg  90 mg Oral DAILY Jacqlyn Larsen, MD       ??? QUEtiapine (SEROquel) tablet 300 mg  300 mg Oral QHS Jacqlyn Larsen, MD       ??? ondansetron (ZOFRAN ODT) tablet 4 mg  4 mg Oral Q8H PRN Ferdinand Cava, MD   4 mg at 05/20/13 1326   ??? oxyCODONE IR (OXY-IR) immediate release tablet 15 mg  15 mg Oral Q6H PRN Ferdinand Cava, MD   15 mg at 05/20/13 1325   ??? pantoprazole (PROTONIX) tablet 40 mg  40 mg Oral ACB&D Ferdinand Cava, MD   40 mg at 05/20/13 0640   ??? SUMAtriptan (IMITREX) tablet 100 mg  100 mg Oral ONCE PRN Ferdinand Cava, MD       ??? acetaminophen (TYLENOL) tablet 650 mg  650  mg Oral Q4H PRN Ferdinand CavaKerisea S McPherson, MD   650 mg at 05/20/13 0954   ??? HYDROmorphone (PF) (DILAUDID) injection 1 mg  1 mg IntraVENous Q3H PRN Ferdinand CavaKerisea S McPherson, MD   1 mg at 05/20/13 1325           Review of Systems as noted above in HPI   PHYSICAL EXAM   BP 114/58    Pulse 93    Temp(Src) 98.1 ??F (36.7 ??C)    Resp 18    SpO2 97%    LMP 07/28/2004      Temp (24hrs), Avg:98.3 ??F (36.8 ??C), Min:98.1 ??F (36.7 ??C), Max:98.6 ??F (37 ??C)    Oxygen Therapy  O2 Sat (%): 97 % (05/20/13 1224)  Pulse via Oximetry: 81 beats per minute (05/18/13 2152)  O2 Device: Room air (05/18/13 2152)  O2 Flow Rate (L/min):  (weaned off 2 liters) (05/18/13 2152)    Intake/Output Summary (Last 24 hours) at 05/20/13 1502  Last data filed at 05/20/13 1413   Gross per 24 hour   Intake    480 ml   Output      0 ml   Net    480 ml      General: No acute distress, alert, conversive  Lungs:  CTAB, good effort   Heart:  Regular rate and rhythm, no murmur, no edema   Abdomen: Soft, nontender and nondistended, normal bowel sounds  Extremities: Warm and dry         DIAGNOSTIC STUDIES              Active Hospital Problems     Diagnosis Date Noted   ??? 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HCC) 05/19/2013   ??? Hematoma 05/18/2013     Overview Note:     RUE hematoma     ??? Bipolar 2 disorder (HCC) 04/26/2013   ??? Acute blood loss anemia 04/26/2013     Prior    ASSESSMENT / PLAN     Right UE hematoma- continue present management and monitoring, discharge when has less pain and stable     Depression- resume regular meds     Factor V/prothrombin II deficiency- appreciate heme input, hold anticoagulation     Anemia- will monitor, stable   ZOX:WRUEFEN:oral  DVT Prophylaxis: SCD  Disposition: home pending above  Time spent with patient: 15 min   Care plan addressed:Nadine CountsBob Runner, broadcasting/film/videosurrogate decision maker as needed, room mate   Jacqlyn LarsenLucy Davis-Pachter, MD

## 2013-05-20 NOTE — Progress Notes (Signed)
Asking for pain meds  Anxious  Pt unwrapped RUE ACE bandage "need to air it out"  Replaced ACE explained to pt she must not  Take bandage off.

## 2013-05-20 NOTE — Progress Notes (Signed)
Hospitalist Progress Note    05/20/2013  Admit Date: 05/18/2013  7:21 PM   NAME: Krista Lopez   DOB:  [birth year]   MRN:  [id number]   Attending: Salome ArntScott J Perlman, MD  PCP:  Irma NewnessLOPA S BHANSALY, DO    SUBJECTIVE:   Patient presented with rue hematoma on coumadin.  INR reversed.  Hb now improving.  Still with significant pain around [place].        Review of Systems negative with exception of pertinent positives noted above  PHYSICAL EXAM   BP 114/58    Pulse 93    Temp(Src) 98.1 ??F (36.7 ??C)    Resp 18    SpO2 97%    LMP 07/28/2004      Temp (24hrs), Avg:98.3 ??F (36.8 ??C), Min:98.1 ??F (36.7 ??C), Max:98.6 ??F (37 ??C)    Oxygen Therapy  O2 Sat (%): 97 % (05/20/13 1224)  Pulse via Oximetry: 81 beats per minute (05/18/13 2152)  O2 Device: Room air (05/18/13 2152)  O2 Flow Rate (L/min):  (weaned off 2 liters) (05/18/13 2152)    Intake/Output Summary (Last 24 hours) at 05/20/13 1509  Last data filed at 05/20/13 1413   Gross per 24 hour   Intake    480 ml   Output      0 ml   Net    480 ml      General: No acute distress????  Lungs:  CTA Bilaterally.   Heart:  Regular rate and rhythm,?? No murmur, rub, or gallop  Abdomen: Soft, Non distended, Non tender, Positive bowel sounds  Extremities: RUE hematoma with surrounding bruising  Neurologic:?? No focal deficits    ASSESSMENT      Active Hospital Problems    Diagnosis Date Noted   ??? 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HCC) 05/19/2013   ??? Hematoma 05/18/2013     Overview Note:     RUE hematoma     ??? Bipolar 2 disorder (HCC) 04/26/2013   ??? Acute blood loss anemia 04/26/2013     Plan:  ?? Continue to hold anticoagulation, follow up with Dr. Daleen SquibbWall in 1 week for reassessment regarding restarting coumadin  ?? Decrease IV pain meds in anticipation of discharge likely this weekend  ?? Hb improving, monitor  ?? Appreciate specialist input.       DVT Prophylaxis: scds    Matilde HaymakerWilliam T Laiklynn Raczynski, MD

## 2013-05-21 LAB — HEMOGLOBIN: HGB: 8 g/dL — ABNORMAL LOW (ref 11.7–15.4)

## 2013-05-21 LAB — PROTHROMBIN TIME + INR
INR: 1 (ref 0.9–1.2)
Prothrombin time: 10.8 s (ref 9.6–12.0)

## 2013-05-21 MED ORDER — OXYCODONE ER 20 MG TABLET,CRUSH RESISTANT,EXTENDED RELEASE 12 HR
20 mg | ORAL_TABLET | Freq: Two times a day (BID) | ORAL | Status: DC
Start: 2013-05-21 — End: 2013-06-17

## 2013-05-21 MED ORDER — OXYCODONE 15 MG TAB
15 mg | ORAL_TABLET | Freq: Four times a day (QID) | ORAL | Status: DC | PRN
Start: 2013-05-21 — End: 2013-06-17

## 2013-05-21 MED ADMIN — pantoprazole (PROTONIX) tablet 40 mg: ORAL | @ 12:00:00 | NDC 82009001190

## 2013-05-21 MED ADMIN — oxyCODONE IR (OXY-IR) immediate release tablet 15 mg: ORAL | @ 12:00:00 | NDC 68084018411

## 2013-05-21 MED ADMIN — oxyCODONE IR (OXY-IR) immediate release tablet 15 mg: ORAL | @ 06:00:00 | NDC 68084018411

## 2013-05-21 MED ADMIN — DULoxetine (CYMBALTA) capsule 90 mg: ORAL | @ 12:00:00 | NDC 68084069211

## 2013-05-21 MED ADMIN — clonazePAM (KlonoPIN) tablet 1 mg: ORAL | @ 02:00:00 | NDC 63739026310

## 2013-05-21 MED ADMIN — acetaminophen (TYLENOL) tablet 650 mg: ORAL | @ 05:00:00 | NDC 50580050110

## 2013-05-21 MED ADMIN — pantoprazole (PROTONIX) tablet 40 mg: ORAL | @ 09:00:00 | NDC 51079005101

## 2013-05-21 MED ADMIN — QUEtiapine (SEROquel) tablet 300 mg: ORAL | @ 02:00:00 | NDC 68084053211

## 2013-05-21 MED ADMIN — ondansetron (ZOFRAN ODT) tablet 4 mg: ORAL | @ 15:00:00 | NDC 68001024616

## 2013-05-21 MED ADMIN — HYDROmorphone (PF) (DILAUDID) injection 1 mg: INTRAVENOUS | @ 15:00:00 | NDC 00409255201

## 2013-05-21 MED ADMIN — HYDROmorphone (PF) (DILAUDID) injection 1 mg: INTRAVENOUS | @ 02:00:00 | NDC 00409255201

## 2013-05-21 MED ADMIN — HYDROmorphone (PF) (DILAUDID) injection 1 mg: INTRAVENOUS | @ 09:00:00 | NDC 00409255201

## 2013-05-21 MED ADMIN — heparin (porcine) pf 300 Units: @ 17:00:00

## 2013-05-21 MED ADMIN — clonazePAM (KlonoPIN) tablet 1 mg: ORAL | @ 12:00:00 | NDC 63739026310

## 2013-05-21 MED ADMIN — oxyCODONE IR (OXY-IR) immediate release tablet 15 mg: ORAL | NDC 68084018411

## 2013-05-21 MED FILL — TYLENOL 325 MG TABLET: 325 mg | ORAL | Qty: 2

## 2013-05-21 MED FILL — CLONAZEPAM 0.5 MG TAB: 0.5 mg | ORAL | Qty: 2

## 2013-05-21 MED FILL — OXYCODONE 15 MG TAB: 15 mg | ORAL | Qty: 1

## 2013-05-21 MED FILL — DULOXETINE 60 MG CAP, DELAYED RELEASE: 60 mg | ORAL | Qty: 1

## 2013-05-21 MED FILL — HYDROMORPHONE (PF) 1 MG/ML IJ SOLN: 1 mg/mL | INTRAMUSCULAR | Qty: 1

## 2013-05-21 MED FILL — ONDANSETRON 4 MG TAB, RAPID DISSOLVE: 4 mg | ORAL | Qty: 1

## 2013-05-21 MED FILL — QUETIAPINE 100 MG TAB: 100 mg | ORAL | Qty: 3

## 2013-05-21 MED FILL — MONOJECT PREFILL ADVANCED (PF) 100 UNIT/ML INTRAVENOUS SYRINGE: 100 unit/mL | INTRAVENOUS | Qty: 3

## 2013-05-21 MED FILL — PANTOPRAZOLE 40 MG TAB, DELAYED RELEASE: 40 mg | ORAL | Qty: 1

## 2013-05-21 NOTE — Progress Notes (Signed)
I have reviewed discharge instructions with the patient.  The patient verbalized understanding.

## 2013-05-21 NOTE — Discharge Summary (Signed)
Hospitalist Discharge Summary     Patient ID:  Krista Lopez  [id number]  47 y.o.  29-Apr-1965  Admit date: 05/18/2013  7:21 PM  Discharge date and time: 05/21/2013  Attending: Salome Arnt, MD  PCP:  Irma Newness, DO  Treatment Team: Attending Provider: Salome Arnt, MD; Consulting Provider: Overton Mam, MD; Care Manager: Edd Fabian, LMSW    Principal Diagnosis Hematoma   Principal Problem:    Hematoma (05/18/2013)      Overview: RUE hematoma    Active Problems:    Bipolar 2 disorder (HCC) (04/26/2013)      Acute blood loss anemia (04/26/2013)      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HCC) (05/19/2013)     HPI: Krista Lopez is a 48 y.o. Caucasian female who presents with left upper extremity pain and swelling. Asked to evaluate and admit pt to [facility] at the request of ER Md. 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 Pt also has IVC filter. Pt had INR checked on Monday which was sub-therapeutic so pt was started on bridge dose lovenox 60mg  injections and coumadin dose increased. Pt noted swelling to her right arm starting yesterday w/significant swelling and pain since. Pt was seen in ER with concerns for compartment syndrome. US of the UE showed hematoma but no DVT and striker needle eval per ER MD was c/w compartment syndrome in posterior compartment of the forearm. Consultation was made with Dr. Suzi Roots who deferred to hospitalist due to her coagulopathy and since it was felt her current findings were not c/w compartment syndrome and more pain from her hematoma. DT admission was requested in case intervention was needed in the future.      Hospital Course:  Please refer to the admission H&P for details of presentation. In summary, the patient presented with RUE pain related to hematoma on coumadin with therapeutic INR.  Pt evaluated by orthopedics with concerns for compartment syndrome  however findings were not compatible with compartment syndrome and pain was related to hematoma.  Pt given FFP with reversal of INR and seen in consultation by hematology who recommend continuing to hold anticoagulation while hematoma resolves and follow up with Dr. Anola Gurney next week.  Pain well controlled during hospitalization and Hb has remained stable.  INR now 1.1.  Pt is currently stable for discharge to home and will follow up with [facility] and pcp next week.       Significant Diagnostic Studies:   Upper extremity duplex: Impression: Negative for right upper extremity DVT. Large superficial hematoma    Labs: Results:       Chemistry Recent Labs      05/18/13   1133   GLU  93   NA  140   K  4.3   CL  105   CO2  26   BUN  12   CREA  0.80   CA  7.7*   AGAP  9   AP  96   TP  5.7*   ALB  3.2*   GLOB  2.5   AGRAT  1.3      CBC w/Diff Recent Labs      05/21/13   0453  05/20/13   1550  05/20/13   0450   05/18/13   1133   WBC   --    --    --    --   5.4  RBC   --    --    --    --   2.92*   HGB  8.0*  8.1*  8.0*   < >  8.8*   HCT   --    --    --    --   27.8*   PLT   --    --    --    --   251   GRANS   --    --    --    --   50   LYMPH   --    --    --    --   25   EOS   --    --    --    --   11*    < > = values in this interval not displayed.      Cardiac Enzymes No results for input(s): CPK, CKND1, MYO in the last 72 hours.    Invalid input(s): CKRMB, TROIP   Coagulation Recent Labs      05/21/13   0453  05/20/13   0450  05/19/13   1045   05/19/13   0030   PTP  10.8  12.8*   --    < >   --    INR  1.0  1.2   --    < >   --    APTT   --    --   34.1*   --   37.1*    < > = values in this interval not displayed.       Lipid Panel No results found for this basename: CHOL, CHOLPOCT, [id number], [id number], [id number], CHOLX, CHOLP, CHLST, CHOLV, [id number], HDL, HDLPOCT, [id number], NHDLCT, [id number], HDLC, HDLP, LDL, LDLPOCT, [id number], NLDLCT, DLDL, LDLC, DLDLP, [id number], VLDLC, VLDL, TGL, TGLX, TRIGL, [id number], TRIGP, TGLPOCT, [id number],  [id number], CHHD, CHHDX      BNP No results for input(s): BNPP in the last 72 hours.   Liver Enzymes Recent Labs      05/18/13   1133   TP  5.7*   ALB  3.2*   AP  96   SGOT  16      Thyroid Studies Lab Results   Component Value Date/Time    TSH 1.338 09/21/2009  4:15 PM            Discharge Exam:  BP 104/79    Pulse 87    Temp(Src) 98.2 ??F (36.8 ??C)    Resp 20    SpO2 99%    LMP 07/28/2004     General appearance: alert, cooperative, no distress, appears stated age  Lungs: clear to auscultation bilaterally  Heart: regular rate and rhythm, S1, S2 normal, no murmur, click, rub or gallop  Abdomen: soft, non-tender. Bowel sounds normal. No masses,  no organomegaly  Extremities: RUE hematoma, bruising noted  Neurologic: Grossly normal    Disposition: home  Discharge Condition: stable  Patient Instructions:   Current Discharge Medication List      START taking these medications    Details   oxyCODONE ER (OXYCONTIN) 20 mg ER tablet Take 1 Tab by mouth every twelve (12) hours. Max Daily Amount: 40 mg.  Qty: 16 Tab, Refills: 0         CONTINUE these medications which have NOT CHANGED    Details   DULoxetine (CYMBALTA) 60 mg capsule Take 90 mg by mouth daily. Indications: ANXIETY WITH DEPRESSION  clonazePAM (KLONOPIN) 1 mg tablet Take 1 mg by mouth three (3) times daily.      QUEtiapine (SEROQUEL) 300 mg tablet Take 300 mg by mouth every evening.      pantoprazole (PROTONIX) 40 mg tablet Take 1 Tab by mouth Before breakfast and dinner.  Qty: 60 Tab, Refills: 1      oxyCODONE IR (OXY-IR) 15 mg immediate release tablet Take 1 Tab by mouth every six (6) hours as needed. Max Daily Amount: 60 mg.  Qty: 28 Tab, Refills: 0      ondansetron (ZOFRAN ODT) 4 mg disintegrating tablet Take 1 Tab by mouth every eight (8) hours as needed for Nausea.  Qty: 20 Tab, Refills: 0      SUMAtriptan (IMITREX) 100 mg tablet Take 100 mg by mouth once as needed for Migraine.         STOP taking these medications       warfarin (COUMADIN) 5 mg tablet  Comments:   Reason for Stopping:         enoxaparin (LOVENOX) 60 mg/0.6 mL injection Comments:   Reason for Stopping:               Activity: Activity as tolerated  Diet: Regular Diet  Wound Care: ace wrap to right upper extremity    Follow-up  ??   With [facility], PCP  Time spent to discharge patient greater than 30 minutes  Signed:  Matilde Haymaker, MD  05/21/2013  11:15 AM

## 2013-05-23 NOTE — Telephone Encounter (Signed)
Could you please call when you get an opportunity?

## 2013-05-23 NOTE — Telephone Encounter (Signed)
-----   Message from Irma NewnessLopa S Bhansaly, DO sent at 05/23/2013  8:24 AM EDT -----  Sorry. Ms. Petra KubaMisanik - she needs to follow up with this week, no later than next week.   Thanks  Irma NewnessLopa S Bhansaly, DO

## 2013-05-24 LAB — PROTHROMBIN TIME + INR
INR: 1 (ref 0.9–1.2)
Prothrombin time: 10.3 s (ref 9.6–12.0)

## 2013-05-24 LAB — HEMOGLOBIN: HGB: 8 g/dL — ABNORMAL LOW (ref 11.7–15.4)

## 2013-05-24 MED ORDER — TRAMADOL 50 MG TAB
50 mg | ORAL | Status: AC
Start: 2013-05-24 — End: 2013-05-24
  Administered 2013-05-24: 10:00:00 via ORAL

## 2013-05-24 MED ORDER — HEPARIN, PORCINE (PF) 100 UNIT/ML IV SYRINGE
100 unit/mL | INTRAVENOUS | Status: DC | PRN
Start: 2013-05-24 — End: 2013-05-24

## 2013-05-24 MED ORDER — ONDANSETRON 8 MG TAB, RAPID DISSOLVE
8 mg | ORAL | Status: AC
Start: 2013-05-24 — End: 2013-05-24
  Administered 2013-05-24: 10:00:00 via ORAL

## 2013-05-24 MED FILL — TRAMADOL 50 MG TAB: 50 mg | ORAL | Qty: 1

## 2013-05-24 MED FILL — MONOJECT PREFILL ADVANCED (PF) 100 UNIT/ML INTRAVENOUS SYRINGE: 100 unit/mL | INTRAVENOUS | Qty: 3

## 2013-05-24 MED FILL — ONDANSETRON 8 MG TAB, RAPID DISSOLVE: 8 mg | ORAL | Qty: 1

## 2013-05-24 NOTE — Telephone Encounter (Signed)
-----   Message from Irma NewnessLopa S Bhansaly, DO sent at 05/24/2013  8:30 AM EDT -----  Please contact patient and notify her that she needs to come in for follow up for hospital discharge this week or next week. She was seen in the ER early this morning as well.   Thanks  Irma NewnessLopa S Bhansaly, DO

## 2013-05-24 NOTE — ED Notes (Signed)
Blood work hand carried to lab at this time

## 2013-05-24 NOTE — ED Notes (Signed)
I have reviewed discharge instructions with the patient.  The patient verbalized understanding.

## 2013-05-24 NOTE — ED Provider Notes (Signed)
HPI Comments: 48 y female c/o right arm pain and hematoma from Coumadin induced coagulopathy. Patient was discharged 3 days ago and had same issues. She was given high strength oxycontin. She returns because she worried she is going to run out.    Patient is a 48 y.o. female presenting with arm pain. [name]   Arm Pain   This is a recurrent problem. The current episode started more than 1 week ago. The problem occurs constantly. The problem has not changed since onset.The pain is present in the right arm. The pain is at a severity of 10/10. The pain is severe. Pertinent negatives include no numbness, full range of motion, no stiffness and no tingling. Treatments tried: oxycontin. There has been no history of extremity trauma.        Past Medical History   Diagnosis Date   ??? Factor V Leiden mutation (HCC)    ??? Infectious disease 02/2009      Hx MRSA / E.Coli bacteremia, MRSA wound infection 2/2 port   ??? Factor II deficiency (HCC)    ??? Calculus of kidney    ??? Depression    ??? Iron deficiency    ??? Migraines    ??? 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 Bipolar disorder (HCC)    ??? Anxiety    ??? Restless leg syndrome         Past Surgical History   Procedure Laterality Date   ??? Cystoscopy  ???     x 13   ??? Lithotripsy  2006   ??? Hx vascular access  July, 2010     Power Port placed in [place], [place] removed   ??? Hx vascular access Right 2013     pt has current rt subclavian port - [facility]   ??? Hx appendectomy  ???   ??? Hx tonsillectomy     ??? Hx cesarean section     ??? Hx tubal ligation     ??? Hx ovarian cyst removal       on R         Family History   Problem Relation Age of Onset   ??? Bleeding Prob Father      Factor V   ??? Cancer Maternal Grandmother      metastatic breast CA.   ??? Ovarian Cancer Mother 16     passed away due to ca   ??? Bipolar Disorder Mother    ??? Kidney Disease Brother      kidney stones        History      Social History   ??? Marital Status: DIVORCED     Spouse Name: N/A     Number of Children: N/A   ??? Years of Education: N/A     Occupational History   ??? Not on file.     Social History Main Topics   ??? Smoking status: Never Smoker    ??? Smokeless tobacco: Never Used   ??? Alcohol Use: Yes      Comment: rarely - only drinks 2 times per year   ??? Drug Use: No      Comment: narcotic seeking per hospital hx.   ??? Sexual Activity:     Partners: Male     Birth Control/ Protection: Surgical     Other Topics Concern   ??? Not on file  Social History Narrative                  ALLERGIES: Ativan; Codeine; Iodinated contrast media - iv dye; Ketorolac tromethamine; Motrin; Pcn; Pneumococcal vaccine; Pneumovax 23; and Shellfish containing products      Review of Systems   Constitutional: Negative.  Negative for activity change.   HENT: Negative.    Eyes: Negative.    Respiratory: Negative.    Cardiovascular: Negative.    Gastrointestinal: Negative.    Genitourinary: Negative.    Musculoskeletal: Negative.  Negative for stiffness.   Skin: Negative.    Neurological: Negative.  Negative for tingling and numbness.   Psychiatric/Behavioral: Negative.    All other systems reviewed and are negative.      Filed Vitals:    05/24/13 0416   BP: 137/65   Pulse: 109   Temp: 98.3 ??F (36.8 ??C)   Resp: 21   Height: 5\' 4"  (1.626 m)   Weight: 56.7 kg (125 lb)   SpO2: 89%            Physical Exam   Constitutional: She is oriented to person, place, and time. She appears well-developed and well-nourished. No distress.   HENT:   Head: Normocephalic and atraumatic.   Right Ear: External ear normal.   Left Ear: External ear normal.   Nose: Nose normal.   Mouth/Throat: Oropharynx is clear and moist. No oropharyngeal exudate.   Eyes: Conjunctivae and EOM are normal. Pupils are equal, round, and reactive to light. Right eye exhibits no discharge. Left eye exhibits no discharge. No scleral icterus.   Neck: Normal range of motion. Neck supple. No JVD present. No  tracheal deviation present.   Cardiovascular: Normal rate, regular rhythm and intact distal pulses.    Pulses:       Radial pulses are 2+ on the right side, and 2+ on the left side.   Pulmonary/Chest: Effort normal and breath sounds normal. No stridor. No respiratory distress. She has no wheezes. She exhibits no tenderness.   Abdominal: Soft. Bowel sounds are normal. She exhibits no distension and no mass. There is no tenderness. There is no rebound.   Musculoskeletal: Normal range of motion. She exhibits no edema or tenderness.        Right upper arm: She exhibits swelling.        Arms:  Neurological: She is alert and oriented to person, place, and time. No cranial nerve deficit.   Skin: Skin is warm and dry. No rash noted. She is not diaphoretic. No erythema. No pallor.   Psychiatric: She has a normal mood and affect. Her behavior is normal. Thought content normal.   Nursing note and vitals reviewed.       MDM  Number of Diagnoses or Management Options  Drug-seeking behavior: established and improving  Hematoma: established and improving  Diagnosis management comments: Patient dishonest about pain medications and the fact she has had multiple Oxycontin Rx filled this month.  Patient has a pain management issue and needs to have all narcotics and pain medications prescribed by PCP or pain management clinic.   She has had multiple fills of  Oxyconti ER  Oxcontin IR   Oxycodone 10/325  Hydrocodone elixir   In last 1 months       Amount and/or Complexity of Data Reviewed  Clinical lab tests: ordered and reviewed  Review and summarize past medical records: yes    Risk of Complications, Morbidity, and/or Mortality  Presenting problems: low  Diagnostic procedures: moderate  Management options: low    Patient Progress  Patient progress: stable      Procedures

## 2013-05-24 NOTE — ED Notes (Signed)
Pt reports r arm pain increasing, was discharge from [facility] on ~5 days ago with dx of hematoma to [place]. Pt presents with pain to rue 8/10: burning, constant. Pt reports that she wasn't able to fill rx for oxycontin 20mg  timed released, taking oxycontin 15mg  and actively taking but running out.

## 2013-05-24 NOTE — ED Notes (Signed)
Pt has removed port access needle herself before nurse could pack port with heparin. After consult with department supervision the decision was made not to re access the port.

## 2013-05-24 NOTE — Telephone Encounter (Signed)
Patient is scheduled for 4/20.

## 2013-05-24 NOTE — Telephone Encounter (Signed)
Thanks.

## 2013-05-30 LAB — URINE MICROSCOPIC
Bacteria: 0 /hpf
Casts: 0 /lpf
Crystals, urine: 0 /LPF
Epithelial cells: 0 /hpf

## 2013-05-30 LAB — HCG URINE, QL. - POC: Pregnancy test,urine (POC): NEGATIVE

## 2013-05-30 NOTE — ED Provider Notes (Signed)
HPI Comments: R flank pain today, prog worse. Had some dark urine, no dysuria. Denies aggrav or allev factors. Has had similar symptoms in the past, states she has had numerous lithotripsy procedures for renal stones, last one was 2 years ago. [facility]    Patient is a 48 y.o. female presenting with flank pain. [name]   Flank Pain   This is a recurrent problem. The current episode started 6 to 12 hours ago. The problem has not changed since onset.The problem occurs constantly. Patient reports not work related injury.The pain is associated with no known injury. The pain is present in the right side. The quality of the pain is described as aching. The pain does not radiate. The pain is moderate. The pain is the same all the time. Pertinent negatives include no chest pain, no fever, no abdominal swelling and no dysuria. She has tried nothing for the symptoms. The treatment provided no relief. The patient's surgical history non-contributory        Past Medical History   Diagnosis Date   ??? Factor V Leiden mutation (HCC)    ??? Infectious disease 02/2009      Hx MRSA / E.Coli bacteremia, MRSA wound infection 2/2 port   ??? Factor II deficiency (HCC)    ??? Calculus of kidney    ??? Depression    ??? Iron deficiency    ??? Migraines    ??? 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 Bipolar disorder (HCC)    ??? Anxiety    ??? Restless leg syndrome         Past Surgical History   Procedure Laterality Date   ??? Cystoscopy  ???     x 13   ??? Lithotripsy  2006   ??? Hx vascular access  July, 2010     [place]   ??? Hx vascular access Right 2013     pt has [facility]   ??? Hx appendectomy  ???   ??? Hx tonsillectomy     ??? Hx cesarean section     ??? Hx tubal ligation     ??? Hx ovarian cyst removal       on R         Family History   Problem Relation Age of Onset    ??? Bleeding Prob Father      Factor V   ??? Cancer Maternal Grandmother      metastatic breast CA.   ??? Ovarian Cancer Mother [id number]     passed away due to ca   ??? Bipolar Disorder Mother    ??? Kidney Disease Brother      kidney stones        History     Social History   ??? Marital Status: DIVORCED     Spouse Name: N/A     Number of Children: N/A   ??? Years of Education: N/A     Occupational History   ??? Not on file.     Social History Main Topics   ??? Smoking status: Never Smoker    ??? Smokeless tobacco: Never Used   ??? Alcohol Use: Yes      Comment: rarely - only drinks 2 times per year   ??? Drug Use: No      Comment:  narcotic seeking per hospital hx.   ??? Sexual Activity:     Partners: Male     Birth Control/ Protection: Surgical     Other Topics Concern   ??? Not on file     Social History Narrative                  ALLERGIES: Ativan; Codeine; Iodinated contrast media - iv dye; Ketorolac tromethamine; Motrin; Pcn; Pneumococcal vaccine; Pneumovax 23; and Shellfish containing products      Review of Systems   Constitutional: Negative for fever and chills.   Cardiovascular: Negative for chest pain.   Gastrointestinal: Positive for nausea. Negative for vomiting.   Genitourinary: Positive for flank pain. Negative for dysuria.       Filed Vitals:    05/30/13 1329 05/30/13 1636 05/30/13 1641 05/30/13 1651   BP: 135/99 139/75  137/95   Pulse: 104      Temp: 98.4 ??F (36.9 ??C)      Resp: 16      Height: 5' 4.5" (1.638 m)      Weight: 63.05 kg (139 lb)      SpO2: 100% 99% 100% 99%            Physical Exam   Constitutional: She is oriented to person, place, and time. She appears well-developed and well-nourished.   HENT:   Head: Normocephalic and atraumatic.   Eyes: Conjunctivae are normal. Pupils are equal, round, and reactive to light.   Neck: Normal range of motion. Neck supple.   Cardiovascular: Normal rate and regular rhythm.    Pulmonary/Chest: Effort normal and breath sounds normal.   Abdominal: Soft. Bowel sounds are normal.    Musculoskeletal: She exhibits no edema or tenderness.   Neurological: She is alert and oriented to person, place, and time.   Skin: Skin is warm and dry.   Psychiatric: She has a normal mood and affect. Her behavior is normal.   Nursing note and vitals reviewed.       MDM  Number of Diagnoses or Management Options  Right flank pain:   Diagnosis management comments: differential diagnosis: UTI, renal colic, pyelonephritis  5:25 PM Discussed results with pt. She has had 5 CT scans over past 2.5 years all showing R renal stones but never any ureteral stones or hydro. [facility] shows she has had 39 scripts for pain medicine over past 12 months       Amount and/or Complexity of Data Reviewed  Clinical lab tests: ordered and reviewed  Decide to obtain previous medical records or to obtain history from someone other than the patient: yes  Review and summarize past medical records: yes    Risk of Complications, Morbidity, and/or Mortality  Presenting problems: moderate  Diagnostic procedures: low  Management options: moderate    Patient Progress  Patient progress: stable      Procedures

## 2013-05-30 NOTE — ED Notes (Signed)
C/o right flank pain. Onset this AM. C/o nausea.

## 2013-05-30 NOTE — ED Notes (Signed)
I have reviewed discharge instructions with the patient.  The patient verbalized understanding.  Pt waiting on [facility] van ride home.

## 2013-06-04 LAB — METABOLIC PANEL, COMPREHENSIVE
A-G Ratio: 1.3 (ref 1.2–3.5)
ALT (SGPT): 11 U/L — ABNORMAL LOW (ref 12–65)
AST (SGOT): 10 U/L — ABNORMAL LOW (ref 15–37)
Albumin: 4.5 g/dL (ref 3.5–5.0)
Alk. phosphatase: 146 U/L — ABNORMAL HIGH (ref 50–136)
Anion gap: 10 mmol/L (ref 7–16)
BUN: 14 MG/DL (ref 6–23)
Bilirubin, total: 0.4 MG/DL (ref 0.2–1.1)
CO2: 23 mmol/L (ref 21–32)
Calcium: 9.5 MG/DL (ref 8.3–10.4)
Chloride: 106 mmol/L (ref 98–107)
Creatinine: 0.76 MG/DL (ref 0.6–1.0)
GFR est AA: >60 mL/min/{1.73_m2} (ref >60)
GFR est non-AA: >60 mL/min/{1.73_m2} (ref >60)
Globulin: 3.5 g/dL (ref 2.3–3.5)
Glucose: 125 mg/dL — ABNORMAL HIGH (ref 65–100)
Potassium: 3.8 mmol/L (ref 3.5–5.1)
Protein, total: 8 g/dL (ref 6.3–8.2)
Sodium: 139 mmol/L (ref 136–145)

## 2013-06-04 LAB — CBC WITH AUTOMATED DIFF
ABS. BASOPHILS: 0 10*3/uL (ref 0.0–0.2)
ABS. EOSINOPHILS: 0.1 10*3/uL (ref 0.0–0.8)
ABS. IMM. GRANS.: 0 10*3/uL (ref 0.0–0.5)
ABS. LYMPHOCYTES: 1.3 10*3/uL (ref 0.5–4.6)
ABS. MONOCYTES: 0.5 10*3/uL (ref 0.1–1.3)
ABS. NEUTROPHILS: 2.5 10*3/uL (ref 1.7–8.2)
BASOPHILS: 1 % (ref 0.0–2.0)
EOSINOPHILS: 1 % (ref 0.5–7.8)
HCT: 35.1 % — ABNORMAL LOW (ref 35.8–46.3)
HGB: 12.1 g/dL (ref 11.7–15.4)
IMMATURE GRANULOCYTES: 0.2 % (ref 0.0–5.0)
LYMPHOCYTES: 30 % (ref 13–44)
MCH: 32 PG (ref 26.1–32.9)
MCHC: 34.5 g/dL (ref 31.4–35.0)
MCV: 92.9 FL (ref 79.6–97.8)
MONOCYTES: 11 % (ref 4.0–12.0)
MPV: 10 FL — ABNORMAL LOW (ref 10.8–14.1)
NEUTROPHILS: 57 % (ref 43–78)
PLATELET: 415 10*3/uL (ref 150–450)
RBC: 3.78 M/uL — ABNORMAL LOW (ref 4.05–5.25)
RDW: 14.9 % — ABNORMAL HIGH (ref 11.9–14.6)
WBC: 4.4 10*3/uL (ref 4.3–11.1)

## 2013-06-04 LAB — PROTHROMBIN TIME + INR
INR: 1 (ref 0.9–1.2)
Prothrombin time: 10.9 s (ref 9.6–12.0)

## 2013-06-04 LAB — LIPASE: Lipase: 286 U/L (ref 73–393)

## 2013-06-04 MED ORDER — ONDANSETRON (PF) 4 MG/2 ML INJECTION
4 mg/2 mL | INTRAMUSCULAR | Status: AC
Start: 2013-06-04 — End: 2013-06-04
  Administered 2013-06-04: 22:00:00 via INTRAVENOUS

## 2013-06-04 MED ORDER — DIPHENHYDRAMINE HCL 50 MG/ML IJ SOLN
50 mg/mL | INTRAMUSCULAR | Status: DC
Start: 2013-06-04 — End: 2013-06-05

## 2013-06-04 MED ORDER — SODIUM CHLORIDE 0.9% BOLUS IV
0.9 % | Freq: Once | INTRAVENOUS | Status: AC
Start: 2013-06-04 — End: 2013-06-04
  Administered 2013-06-04: 22:00:00 via INTRAVENOUS

## 2013-06-04 MED ADMIN — diphenhydrAMINE (BENADRYL) injection 12.5 mg: INTRAVENOUS | @ 23:00:00 | NDC 63323066401

## 2013-06-04 MED ADMIN — meperidine (DEMEROL) injection 25 mg: INTRAVENOUS | @ 22:00:00 | NDC 00641605201

## 2013-06-04 MED FILL — DIPHENHYDRAMINE HCL 50 MG/ML IJ SOLN: 50 mg/mL | INTRAMUSCULAR | Qty: 1

## 2013-06-04 MED FILL — MEPERIDINE (PF) 25MG/ML INJECTION: 25 mg/mL | INTRAMUSCULAR | Qty: 1

## 2013-06-04 MED FILL — ONDANSETRON (PF) 4 MG/2 ML INJECTION: 4 mg/2 mL | INTRAMUSCULAR | Qty: 2

## 2013-06-04 NOTE — ED Provider Notes (Signed)
HPI Comments: [id number] wf with history of kidney stonse complains of pain in the right back with radiation around to the abdomen. She states she has multiple stones in the right kidney.     Patient is a 48 y.o. female presenting with abdominal pain.   Abdominal Pain   Pertinent negatives include no fever, no dysuria, no hematuria, no headaches, no chest pain and no back pain.        Past Medical History   Diagnosis Date   ??? Factor V Leiden mutation (HCC)    ??? Infectious disease 02/2009      Hx MRSA / E.Coli bacteremia, MRSA wound infection 2/2 port   ??? Factor II deficiency (HCC)    ??? Calculus of kidney    ??? Depression    ??? Iron deficiency    ??? Migraines    ??? 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 Bipolar disorder (HCC)    ??? Anxiety    ??? Restless leg syndrome         Past Surgical History   Procedure Laterality Date   ??? Cystoscopy  ???     x 13   ??? Lithotripsy  2006   ??? Hx vascular access  July, 2010     [place]   ??? Hx vascular access Right 2013     pt has [facility]   ??? Hx appendectomy  ???   ??? Hx tonsillectomy     ??? Hx cesarean section     ??? Hx tubal ligation     ??? Hx ovarian cyst removal       on R         Family History   Problem Relation Age of Onset   ??? Bleeding Prob Father      Factor V   ??? Cancer Maternal Grandmother      metastatic breast CA.   ??? Ovarian Cancer Mother [id number]     passed away due to ca   ??? Bipolar Disorder Mother    ??? Kidney Disease Brother      kidney stones        History     Social History   ??? Marital Status: DIVORCED     Spouse Name: N/A     Number of Children: N/A   ??? Years of Education: N/A     Occupational History   ??? Not on file.     Social History Main Topics   ??? Smoking status: Never Smoker    ??? Smokeless tobacco: Never Used   ??? Alcohol Use: Yes      Comment: rarely - only drinks 2 times per year   ??? Drug Use: No      Comment: narcotic seeking per hospital  hx.   ??? Sexual Activity:     Partners: Male     Birth Control/ Protection: Surgical     Other Topics Concern   ??? Not on file     Social History Narrative                  ALLERGIES: Ativan; Codeine; Iodinated contrast media - iv dye; Ketorolac tromethamine; Motrin; Pcn; Pneumococcal vaccine; Pneumovax 23; and Shellfish containing products      Review of Systems   Constitutional: Negative for fever and fatigue.   HENT: Negative for congestion and sinus  pressure.    Respiratory: Negative for cough, chest tightness and shortness of breath.    Cardiovascular: Negative for chest pain and leg swelling.   Gastrointestinal: Positive for abdominal pain.   Genitourinary: Positive for flank pain. Negative for dysuria, urgency and hematuria.   Musculoskeletal: Negative for back pain, joint swelling, neck pain and neck stiffness.   Skin: Negative for rash.   Neurological: Negative for headaches.   Psychiatric/Behavioral: Negative for confusion and agitation.   All other systems reviewed and are negative.      Filed Vitals:    06/04/13 1547   BP: 125/91   Pulse: 113   Temp: 97.7 ??F (36.5 ??C)   Resp: 18   Height: 5' 4.5" (1.638 m)   Weight: 63.05 kg (139 lb)   SpO2: 96%            Physical Exam   Constitutional: She is oriented to person, place, and time. She appears well-developed and well-nourished.   HENT:   Head: Normocephalic and atraumatic.   Right Ear: External ear normal.   Left Ear: External ear normal.   Nose: Nose normal.   Mouth/Throat: Oropharynx is clear and moist.   Eyes: Conjunctivae and EOM are normal. Pupils are equal, round, and reactive to light.   Neck: Normal range of motion. Neck supple.   Cardiovascular: Normal rate, regular rhythm, normal heart sounds and intact distal pulses.    Pulmonary/Chest: Effort normal and breath sounds normal.   Abdominal: Soft.   Musculoskeletal: Normal range of motion. She exhibits tenderness.        Arms:  Neurological: She is alert and oriented to person, place, and time.    Skin: Skin is warm and dry.   Nursing note and vitals reviewed.       MDM    Procedures

## 2013-06-04 NOTE — ED Notes (Signed)
Report from katie, rn

## 2013-06-04 NOTE — ED Notes (Signed)
Patient given demerol and had itching and redness after administration. MD notified and verbal orders received. Patient given diphenhydramine and reports itching is now under control.

## 2013-06-04 NOTE — ED Notes (Signed)
Pt reports right sided abdominal pain that started at 3 oclock this morning. Pt reports history of this pain when she was having problems with gallbladder contractions but still has gallbladder. Pt reports nausea but denies vomiting. Pt has port to be accessed.      Alene MiresAmanda M Estephan Gallardo, RN

## 2013-06-04 NOTE — ED Notes (Signed)
Patient advised urine sample needed.

## 2013-06-04 NOTE — ED Notes (Signed)
Dr. Ernestine ConradBerglind in to see patient

## 2013-06-04 NOTE — ED Notes (Signed)
I have reviewed discharge instructions with the patient.  The patient verbalized understanding. Port flushed with heparin and deaccessed. Pt ambulatory to lobby in no acute distress. 1 prescription provided.      Alene MiresAmanda M Mikeria Valin, RN

## 2013-06-04 NOTE — ED Notes (Signed)
Med given as ordered

## 2013-06-04 NOTE — ED Notes (Signed)
Patient allergy list updated.

## 2013-06-04 NOTE — ED Notes (Signed)
Patient medicated per orders. IV infusing.

## 2013-06-04 NOTE — ED Notes (Signed)
Report given to Amberly, RN

## 2013-06-05 MED ORDER — HYDROMORPHONE 4 MG TAB
4 mg | ORAL_TABLET | ORAL | Status: DC | PRN
Start: 2013-06-05 — End: 2013-06-17

## 2013-06-05 MED ORDER — HYDROMORPHONE (PF) 1 MG/ML IJ SOLN
1 mg/mL | INTRAMUSCULAR | Status: AC
Start: 2013-06-05 — End: 2013-06-04
  Administered 2013-06-05: 02:00:00 via INTRAVENOUS

## 2013-06-05 MED ORDER — DIPHENHYDRAMINE HCL 50 MG/ML IJ SOLN
50 mg/mL | INTRAMUSCULAR | Status: AC
Start: 2013-06-05 — End: 2013-06-04
  Administered 2013-06-05: 02:00:00 via INTRAVENOUS

## 2013-06-05 MED ORDER — ONDANSETRON (PF) 4 MG/2 ML INJECTION
4 mg/2 mL | INTRAMUSCULAR | Status: AC
Start: 2013-06-05 — End: 2013-06-04
  Administered 2013-06-05: 02:00:00 via INTRAVENOUS

## 2013-06-05 MED ORDER — HEPARIN, PORCINE (PF) 100 UNIT/ML IV SYRINGE
100 unit/mL | INTRAVENOUS | Status: DC | PRN
Start: 2013-06-05 — End: 2013-06-05
  Administered 2013-06-05: 02:00:00

## 2013-06-05 MED FILL — ONDANSETRON (PF) 4 MG/2 ML INJECTION: 4 mg/2 mL | INTRAMUSCULAR | Qty: 2

## 2013-06-05 MED FILL — DIPHENHYDRAMINE HCL 50 MG/ML IJ SOLN: 50 mg/mL | INTRAMUSCULAR | Qty: 1

## 2013-06-05 MED FILL — MONOJECT PREFILL ADVANCED (PF) 100 UNIT/ML INTRAVENOUS SYRINGE: 100 unit/mL | INTRAVENOUS | Qty: 3

## 2013-06-05 MED FILL — HYDROMORPHONE (PF) 1 MG/ML IJ SOLN: 1 mg/mL | INTRAMUSCULAR | Qty: 1

## 2013-06-17 LAB — METABOLIC PANEL, COMPREHENSIVE
A-G Ratio: 1.2 (ref 1.2–3.5)
ALT (SGPT): 21 U/L (ref 12–65)
AST (SGOT): 26 U/L (ref 15–37)
Albumin: 3.2 g/dL — ABNORMAL LOW (ref 3.5–5.0)
Alk. phosphatase: 85 U/L (ref 50–136)
Anion gap: 5 mmol/L — ABNORMAL LOW (ref 7–16)
BUN: 12 MG/DL (ref 6–23)
Bilirubin, total: 0.1 MG/DL — ABNORMAL LOW (ref 0.2–1.1)
CO2: 30 mmol/L (ref 21–32)
Calcium: 8.2 MG/DL — ABNORMAL LOW (ref 8.3–10.4)
Chloride: 107 mmol/L (ref 98–107)
Creatinine: 0.7 MG/DL (ref 0.6–1.0)
GFR est AA: >60 mL/min/{1.73_m2} (ref >60)
GFR est non-AA: >60 mL/min/{1.73_m2} (ref >60)
Globulin: 2.6 g/dL (ref 2.3–3.5)
Glucose: 99 mg/dL (ref 65–100)
Potassium: 3.8 mmol/L (ref 3.5–5.1)
Protein, total: 5.8 g/dL — ABNORMAL LOW (ref 6.3–8.2)
Sodium: 142 mmol/L (ref 136–145)

## 2013-06-17 LAB — CBC WITH AUTOMATED DIFF
ABS. BASOPHILS: 0 10*3/uL (ref 0.0–0.2)
ABS. EOSINOPHILS: 0.5 10*3/uL (ref 0.0–0.8)
ABS. IMM. GRANS.: 0 10*3/uL (ref 0.0–0.5)
ABS. LYMPHOCYTES: 2 10*3/uL (ref 0.5–4.6)
ABS. MONOCYTES: 0.5 10*3/uL (ref 0.1–1.3)
ABS. NEUTROPHILS: 1.4 10*3/uL — ABNORMAL LOW (ref 1.7–8.2)
BASOPHILS: 1 % (ref 0.0–2.0)
EOSINOPHILS: 11 % — ABNORMAL HIGH (ref 0.5–7.8)
HCT: 30.2 % — ABNORMAL LOW (ref 35.8–46.3)
HGB: 9.8 g/dL — ABNORMAL LOW (ref 11.7–15.4)
IMMATURE GRANULOCYTES: 0.2 % (ref 0.0–5.0)
LYMPHOCYTES: 46 % — ABNORMAL HIGH (ref 13–44)
MCH: 31.3 PG (ref 26.1–32.9)
MCHC: 32.5 g/dL (ref 31.4–35.0)
MCV: 96.5 FL (ref 79.6–97.8)
MONOCYTES: 11 % (ref 4.0–12.0)
MPV: 10.2 FL — ABNORMAL LOW (ref 10.8–14.1)
NEUTROPHILS: 31 % — ABNORMAL LOW (ref 43–78)
PLATELET: 207 10*3/uL (ref 150–450)
RBC: 3.13 M/uL — ABNORMAL LOW (ref 4.05–5.25)
RDW: 13.9 % (ref 11.9–14.6)
WBC: 4.4 10*3/uL (ref 4.3–11.1)

## 2013-06-17 LAB — URINE MICROSCOPIC: RBC: >100 /hpf — ABNORMAL HIGH

## 2013-06-17 LAB — POC FECAL OCCULT BLOOD: Occult blood, stool (POC): NEGATIVE

## 2013-06-17 LAB — PROTHROMBIN TIME + INR
INR: >6.8 — CR (ref 0.9–1.2)
Prothrombin time: >63 s — ABNORMAL HIGH (ref 9.6–12.0)

## 2013-06-17 LAB — LIPASE: Lipase: 133 U/L (ref 73–393)

## 2013-06-17 MED ORDER — PROMETHAZINE 25 MG TAB
25 mg | ORAL_TABLET | Freq: Four times a day (QID) | ORAL | Status: DC | PRN
Start: 2013-06-17 — End: 2013-08-05

## 2013-06-17 MED ORDER — OXYCODONE 5 MG TAB
5 mg | ORAL | Status: AC
Start: 2013-06-17 — End: 2013-06-17
  Administered 2013-06-17: 08:00:00 via ORAL

## 2013-06-17 MED ORDER — ONDANSETRON (PF) 4 MG/2 ML INJECTION
4 mg/2 mL | INTRAMUSCULAR | Status: AC
Start: 2013-06-17 — End: 2013-06-17
  Administered 2013-06-17: 08:00:00 via INTRAVENOUS

## 2013-06-17 MED ORDER — SODIUM CHLORIDE 0.9% BOLUS IV
0.9 % | Freq: Once | INTRAVENOUS | Status: AC
Start: 2013-06-17 — End: 2013-06-17
  Administered 2013-06-17: 08:00:00 via INTRAVENOUS

## 2013-06-17 MED ORDER — HEPARIN, PORCINE (PF) 100 UNIT/ML IV SYRINGE
100 unit/mL | INTRAVENOUS | Status: DC | PRN
Start: 2013-06-17 — End: 2013-06-17
  Administered 2013-06-17: 09:00:00

## 2013-06-17 MED ORDER — TRAMADOL 50 MG TAB
50 mg | ORAL_TABLET | Freq: Four times a day (QID) | ORAL | Status: DC | PRN
Start: 2013-06-17 — End: 2013-08-05

## 2013-06-17 MED ORDER — DIPHENHYDRAMINE HCL 50 MG/ML IJ SOLN
50 mg/mL | INTRAMUSCULAR | Status: AC
Start: 2013-06-17 — End: 2013-06-17
  Administered 2013-06-17: 09:00:00 via INTRAVENOUS

## 2013-06-17 MED FILL — ONDANSETRON (PF) 4 MG/2 ML INJECTION: 4 mg/2 mL | INTRAMUSCULAR | Qty: 2

## 2013-06-17 MED FILL — OXYCODONE 5 MG TAB: 5 mg | ORAL | Qty: 2

## 2013-06-17 MED FILL — MONOJECT PREFILL ADVANCED (PF) 100 UNIT/ML INTRAVENOUS SYRINGE: 100 unit/mL | INTRAVENOUS | Qty: 3

## 2013-06-17 NOTE — ED Notes (Signed)
The patient was given their discharge instructions and  was given prescriptions.   The  patient verbalized understanding and had no additional questions. The patient was alert and was discharged via Ambulatory, without additional complaints at time of discharge.  No apparent distress noted

## 2013-06-17 NOTE — ED Provider Notes (Addendum)
HPI Comments: Recurring right sided abdominal pain, seen here greater than 20 times for similar complaint    Patient is a 48 y.o. female presenting with abdominal pain. [name]   Abdominal Pain   This is a chronic problem. The current episode started yesterday. The problem occurs constantly. The problem has not changed since onset.The pain is located in the RUQ. The quality of the pain is aching and cramping. The pain is at a severity of 4/10. The pain is moderate. Associated symptoms include nausea. Pertinent negatives include no fever, no vomiting, no dysuria, no frequency and no back pain. Past workup includes CT scan and ultrasound. Her past medical history does not include gallstones or Crohn's disease.        Past Medical History   Diagnosis Date   ??? 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 Infectious disease 02/2009      Hx MRSA / E.Coli bacteremia, MRSA wound infection 2/2 port   ??? 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 Calculus of kidney    ??? Depression    ??? Iron deficiency    ??? Migraines    ??? 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 Bipolar disorder ([place])    ??? Anxiety    ??? Restless leg syndrome         Past Surgical History   Procedure Laterality Date   ??? Cystoscopy  ???     x 13   ??? Lithotripsy  2006   ??? Hx vascular access  July, 2010     Power Port placed in [place], [place]., removed   ??? Hx vascular access Right 2013     pt has current rt subclavian port - [facility]   ??? Hx appendectomy  ???   ??? Hx tonsillectomy     ??? Hx cesarean section     ??? Hx tubal ligation     ??? Hx ovarian cyst removal       on R         Family History   Problem Relation Age of Onset   ??? Bleeding Prob Father      Factor V   ??? Cancer Maternal Grandmother      metastatic breast CA.   ??? Ovarian Cancer Mother 31     passed away due to ca   ??? Bipolar Disorder Mother    ??? Kidney Disease Brother      kidney stones        History     Social History   ???  Marital Status: DIVORCED     Spouse Name: N/A     Number of Children: N/A   ??? Years of Education: N/A     Occupational History   ??? Not on file.     Social History Main Topics   ??? Smoking status: Never Smoker    ??? Smokeless tobacco: Never Used   ??? Alcohol Use: Yes      Comment: rarely - only drinks 2 times per year   ??? Drug Use: No      Comment: narcotic seeking per hospital hx.   ??? Sexual Activity:     Partners: Male     Birth Control/ Protection: Surgical     Other Topics Concern   ??? Not on file     Social History Narrative  ALLERGIES: Ativan; Codeine; Demerol; Iodinated contrast media - iv dye; Ketorolac tromethamine; Motrin; Pcn; Pneumococcal vaccine; Pneumovax 23; and Shellfish containing products      Review of Systems   Constitutional: Negative for fever, chills and diaphoresis.   HENT: Negative for dental problem, drooling, ear discharge, trouble swallowing and voice change.    Eyes: Negative for photophobia, redness and visual disturbance.   Respiratory: Negative for choking and chest tightness.    Cardiovascular: Negative for palpitations and leg swelling.   Gastrointestinal: Positive for nausea and abdominal pain. Negative for vomiting.   Endocrine: Negative for polydipsia, polyphagia and polyuria.   Genitourinary: Negative for dysuria, frequency, flank pain, vaginal discharge and vaginal pain.   Musculoskeletal: Negative for back pain and gait problem.   Skin: Negative for pallor and rash.   Allergic/Immunologic: Negative for food allergies and immunocompromised state.   Neurological: Negative for seizures, syncope, speech difficulty and numbness.   Hematological: Negative for adenopathy. Does not bruise/bleed easily.   Psychiatric/Behavioral: Negative for behavioral problems and decreased concentration.   All other systems reviewed and are negative.      Filed Vitals:    06/17/13 0232   BP: 139/63   Pulse: 93   Temp: 98.2 ??F (36.8 ??C)   Resp: 18   Height: 5' 4.5" (1.638 m)   Weight:  59.421 kg (131 lb)   SpO2: 99%            Physical Exam   Constitutional: She is oriented to person, place, and time. She appears well-developed and well-nourished. No distress.   HENT:   Head: Normocephalic and atraumatic.   Mouth/Throat: Oropharynx is clear and moist. No oropharyngeal exudate.   Eyes: Conjunctivae and EOM are normal. Pupils are equal, round, and reactive to light. Right eye exhibits no discharge. Left eye exhibits no discharge. No scleral icterus.   Neck: Normal range of motion. Neck supple. No JVD present. No tracheal deviation present. No thyromegaly present.   Cardiovascular: Normal rate, regular rhythm, normal heart sounds and intact distal pulses.  Exam reveals no gallop and no friction rub.    No murmur heard.  Pulmonary/Chest: Effort normal and breath sounds normal. No stridor. No respiratory distress. She has no wheezes. She has no rales. She exhibits no tenderness.   Abdominal: Soft. Bowel sounds are normal. She exhibits no distension and no mass. There is generalized tenderness. There is no rebound and no guarding.   Genitourinary: Guaiac negative stool.   Musculoskeletal: Normal range of motion. She exhibits no edema or tenderness.   Lymphadenopathy:     She has no cervical adenopathy.   Neurological: She is alert and oriented to person, place, and time. She has normal reflexes. No cranial nerve deficit. She exhibits normal muscle tone. Coordination normal.   Skin: Skin is warm and dry. No rash noted. She is not diaphoretic. No erythema. No pallor.   Psychiatric: She has a normal mood and affect. Her behavior is normal. Judgment and thought content normal.   Nursing note and vitals reviewed.       MDM  Number of Diagnoses or Management Options  Diagnosis management comments: Will obtain basic labs and urine  Low suspicion for any emergent pathology       Amount and/or Complexity of Data Reviewed  Clinical lab tests: ordered and reviewed    Risk of Complications, Morbidity, and/or  Mortality  Presenting problems: moderate  Diagnostic procedures: low  Management options: low    Patient Progress  Patient progress:  stable      Procedures    Results Include:    Recent Results (from the past 24 hour(s))   CBC WITH AUTOMATED DIFF    Collection Time     06/17/13  3:15 AM       Result Value Ref Range    WBC 4.4  4.3 - 11.1 K/uL    RBC 3.13 (*) 4.05 - 5.25 M/uL    HGB 9.8 (*) 11.7 - 15.4 g/dL    HCT 30.2 (*) 35.8 - 46.3 %    MCV 96.5  79.6 - 97.8 FL    MCH 31.3  26.1 - 32.9 PG    MCHC 32.5  31.4 - 35.0 g/dL    RDW 13.9  11.9 - 14.6 %    PLATELET 207  150 - 450 K/uL    MPV 10.2 (*) 10.8 - 14.1 FL    DF AUTOMATED      NEUTROPHILS 31 (*) 43 - 78 %    LYMPHOCYTES 46 (*) 13 - 44 %    MONOCYTES 11  4.0 - 12.0 %    EOSINOPHILS 11 (*) 0.5 - 7.8 %    BASOPHILS 1  0.0 - 2.0 %    IMMATURE GRANULOCYTES 0.2  0.0 - 5.0 %    ABS. NEUTROPHILS 1.4 (*) 1.7 - 8.2 K/UL    ABS. LYMPHOCYTES 2.0  0.5 - 4.6 K/UL    ABS. MONOCYTES 0.5  0.1 - 1.3 K/UL    ABS. EOSINOPHILS 0.5  0.0 - 0.8 K/UL    ABS. BASOPHILS 0.0  0.0 - 0.2 K/UL    ABS. IMM. GRANS. 0.0  0.0 - 0.5 K/UL   METABOLIC PANEL, COMPREHENSIVE    Collection Time     06/17/13  3:15 AM       Result Value Ref Range    Sodium 142  136 - 145 mmol/L    Potassium 3.8  3.5 - 5.1 mmol/L    Chloride 107  98 - 107 mmol/L    CO2 30  21 - 32 mmol/L    Anion gap 5 (*) 7 - 16 mmol/L    Glucose 99  65 - 100 mg/dL    BUN 12  6 - 23 MG/DL    Creatinine 0.70  0.6 - 1.0 MG/DL    GFR est AA >60  >60 ml/min/1.96m2    GFR est non-AA >60  >60 ml/min/1.69m2    Calcium 8.2 (*) 8.3 - 10.4 MG/DL    Bilirubin, total PENDING      ALT 21  12 - 65 U/L    AST 26  15 - 37 U/L    Alk. phosphatase 85  50 - 136 U/L    Protein, total 5.8 (*) 6.3 - 8.2 g/dL    Albumin 3.2 (*) 3.5 - 5.0 g/dL    Globulin 2.6  2.3 - 3.5 g/dL    A-G Ratio 1.2  1.2 - 3.5     LIPASE    Collection Time     06/17/13  3:15 AM       Result Value Ref Range    Lipase 133  73 - 393 U/L   PROTHROMBIN TIME + INR    Collection Time      06/17/13  3:15 AM       Result Value Ref Range    Prothrombin time >63.0 (*) 9.6 - 12.0 sec    INR >6.8 (*) 0.9 - 1.2       4:03  AM  INR is supratherapeutic but no signs of any active bleeding

## 2013-06-17 NOTE — ED Notes (Signed)
Pt with critical INR and complains of itching.  Dr. Manson PasseyBrown made aware.

## 2013-06-17 NOTE — Telephone Encounter (Signed)
Can you please call patient and have her follow up with us next week. She has no showed for scheduled appointments in the past. She was seen in the ER early this morning for abdominal pain - states that we rescheduled her appointment yesterday? Unsure if this is true as she was not on my scheduled yesterday.   Please advise her that she needs to be seen as she has had many ER visits since her last visit.   Thanks.   Oceana Walthall S Lauramae Kneisley, DO

## 2013-06-17 NOTE — Telephone Encounter (Signed)
Called patient to see if she could come in.  No answer and no VM box set up.

## 2013-06-19 LAB — CULTURE, URINE
Culture result:: >100000
Culture: >100000

## 2013-06-26 NOTE — ED Notes (Signed)
Chest pain since noon. Nothing makes pain better or worse.

## 2013-06-26 NOTE — ED Notes (Signed)
Refused peripheral stick for blood draw.  Only wants port accessed once in treatment room and does not have to sit in lobby.

## 2013-06-26 NOTE — ED Notes (Signed)
Patient to room. Attempting to draw blood- no port access kits in department.  Supply called for cath kits to be sent to ED urgently.  Will initiate port access as soon as required supplies arrive in department.

## 2013-06-26 NOTE — ED Notes (Signed)
Chart reviewed.

## 2013-06-26 NOTE — ED Provider Notes (Signed)
HPI Comments: Krista Lopez is a 48 y.o. Caucasian female in NAD presents with c/o chest pain onset this AM. Pt states she has a hx of PE's and stopped Lovenox yesterday and started back on Coumadin this AM.      Patient is a 48 y.o. female presenting with chest pain. [name]   Chest Pain (Angina)   This is a recurrent problem. The current episode started 12 to 24 hours ago. The problem has not changed since onset.The problem occurs constantly. The pain is associated with normal activity. The pain is present in the left side. The pain is at a severity of 7/10. The quality of the pain is described as sharp and stabbing. The pain radiates to the upper back. The symptoms are aggravated by deep breathing. Pertinent negatives include no abdominal pain, no back pain, no claudication, no cough, no diaphoresis, no dizziness, no exertional chest pressure, no fever, no headaches, no hemoptysis, no irregular heartbeat, no leg pain, no lower extremity edema, no malaise/fatigue, no nausea, no near-syncope, no numbness, no orthopnea, no palpitations, no PND, no shortness of breath, no sputum production, no vomiting and no weakness. She has tried nothing for the symptoms. Risk factors include postmenopause. Her past medical history is significant for PE.       Past Medical History   Diagnosis Date   ??? 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 Infectious disease 02/2009      Hx MRSA / E.Coli bacteremia, MRSA wound infection 2/2 port   ??? 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 Calculus of kidney    ??? Depression    ??? Iron deficiency    ??? Migraines    ??? 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 Bipolar disorder ([place])    ??? Anxiety    ??? Restless leg syndrome         Past Surgical History   Procedure Laterality Date   ??? Cystoscopy  ???     x 13   ??? Lithotripsy  2006   ??? Hx vascular access  July, 2010     Power Port placed in [place], [place]., removed   ??? Hx vascular  access Right 2013     pt has current rt subclavian port - [facility]   ??? Hx appendectomy  ???   ??? Hx tonsillectomy     ??? Hx cesarean section     ??? Hx tubal ligation     ??? Hx ovarian cyst removal       on R         Family History   Problem Relation Age of Onset   ??? Bleeding Prob Father      Factor V   ??? Cancer Maternal Grandmother      metastatic breast CA.   ??? Ovarian Cancer Mother 60     passed away due to ca   ??? Bipolar Disorder Mother    ??? Kidney Disease Brother      kidney stones        History     Social History   ??? Marital Status: DIVORCED     Spouse Name: N/A     Number of Children: N/A   ??? Years of Education: N/A     Occupational History   ??? Not on file.     Social History Main Topics   ???  Smoking status: Never Smoker    ??? Smokeless tobacco: Never Used   ??? Alcohol Use: Yes      Comment: rarely - only drinks 2 times per year   ??? Drug Use: No      Comment: narcotic seeking per hospital hx.   ??? Sexual Activity:     Partners: Male     Birth Control/ Protection: Surgical     Other Topics Concern   ??? Not on file     Social History Narrative                  ALLERGIES: Ativan; Codeine; Demerol; Iodinated contrast media - iv dye; Ketorolac tromethamine; Motrin; Pcn; Pneumococcal vaccine; Pneumovax 23; and Shellfish containing products      Review of Systems   Constitutional: Negative for fever, malaise/fatigue and diaphoresis.   Respiratory: Negative for cough, hemoptysis, sputum production and shortness of breath.    Cardiovascular: Positive for chest pain. Negative for palpitations, orthopnea, claudication, PND and near-syncope.   Gastrointestinal: Negative for nausea, vomiting and abdominal pain.   Musculoskeletal: Negative for back pain.   Neurological: Negative for dizziness, weakness, numbness and headaches.   Psychiatric/Behavioral: The patient is nervous/anxious.    All other systems reviewed and are negative.      Filed Vitals:    06/26/13 1944   BP: 138/79   Pulse: 83   Temp: 98.1 ??F  (36.7 ??C)   Resp: 18   Height: 5' 4.5" (1.638 m)   Weight: 63.05 kg (139 lb)   SpO2: 99%            Physical Exam   Constitutional: She is oriented to person, place, and time. Vital signs are normal. She appears well-developed and well-nourished. She is cooperative.  Non-toxic appearance. She has a sickly appearance. She does not appear ill. No distress.   HENT:   Head: Normocephalic and atraumatic.   Right Ear: External ear normal.   Left Ear: External ear normal.   Nose: Nose normal.   Mouth/Throat: Oropharynx is clear and moist.   Eyes: Conjunctivae and EOM are normal. No scleral icterus.   Neck: Normal range of motion and full passive range of motion without pain. Neck supple.   Cardiovascular: Normal rate, regular rhythm, normal heart sounds and intact distal pulses.  Exam reveals no gallop.    No murmur heard.  Pulmonary/Chest: Effort normal and breath sounds normal. No respiratory distress.   Musculoskeletal: Normal range of motion. She exhibits no tenderness.   Neurological: She is alert and oriented to person, place, and time. She has normal strength. No sensory deficit.   Skin: Skin is warm, dry and intact. No rash noted. She is not diaphoretic. No pallor.   Psychiatric: Her speech is normal and behavior is normal. Judgment and thought content normal. Her mood appears anxious. Cognition and memory are normal.   Nursing note and vitals reviewed.       MDM    Procedures    Recent Results (from the past 24 hour(s))   CBC WITH AUTOMATED DIFF    Collection Time     06/26/13  9:15 PM       Result Value Ref Range    WBC 5.9  4.3 - 11.1 K/uL    RBC 3.70 (*) 4.05 - 5.25 M/uL    HGB 11.7  11.7 - 15.4 Alesa Echevarria/dL    HCT 34.7 (*) 35.8 - 46.3 %    MCV 93.8  79.6 - 97.8 FL  MCH 31.6  26.1 - 32.9 PG    MCHC 33.7  31.4 - 35.0 Vola Beneke/dL    RDW 13.7  11.9 - 14.6 %    PLATELET 328  150 - 450 K/uL    MPV 10.0 (*) 10.8 - 14.1 FL    DF AUTOMATED      NEUTROPHILS 56  43 - 78 %    LYMPHOCYTES 32  13 - 44 %    MONOCYTES 9  4.0 - 12.0 %     EOSINOPHILS 3  0.5 - 7.8 %    BASOPHILS 0  0.0 - 2.0 %    IMMATURE GRANULOCYTES 0.0  0.0 - 5.0 %    ABS. NEUTROPHILS 3.2  1.7 - 8.2 K/UL    ABS. LYMPHOCYTES 1.9  0.5 - 4.6 K/UL    ABS. MONOCYTES 0.5  0.1 - 1.3 K/UL    ABS. EOSINOPHILS 0.2  0.0 - 0.8 K/UL    ABS. BASOPHILS 0.0  0.0 - 0.2 K/UL    ABS. IMM. GRANS. 0.0  0.0 - 0.5 K/UL   METABOLIC PANEL, COMPREHENSIVE    Collection Time     06/26/13  9:15 PM       Result Value Ref Range    Sodium 138  136 - 145 mmol/L    Potassium 3.5  3.5 - 5.1 mmol/L    Chloride 107  98 - 107 mmol/L    CO2 23  21 - 32 mmol/L    Anion gap 8  7 - 16 mmol/L    Glucose 108 (*) 65 - 100 mg/dL    BUN 12  6 - 23 MG/DL    Creatinine 0.82  0.6 - 1.0 MG/DL    GFR est AA >60  >60 ml/min/1.38m2    GFR est non-AA >60  >60 ml/min/1.76m2    Calcium 9.3  8.3 - 10.4 MG/DL    Bilirubin, total 0.3  0.2 - 1.1 MG/DL    ALT 21  12 - 65 U/L    AST 16  15 - 37 U/L    Alk. phosphatase 106  50 - 136 U/L    Protein, total 7.4  6.3 - 8.2 Hadlei Stitt/dL    Albumin 4.0  3.5 - 5.0 Antawn Sison/dL    Globulin 3.4  2.3 - 3.5 Maggy Wyble/dL    A-Toriann Spadoni Ratio 1.2  1.2 - 3.5     PROTHROMBIN TIME + INR    Collection Time     06/26/13  9:15 PM       Result Value Ref Range    Prothrombin time 11.2  9.6 - 12.0 sec    INR 1.0  0.9 - 1.2     TROPONIN I    Collection Time     06/26/13  9:15 PM       Result Value Ref Range    Troponin-I, Qt. <0.02 (*) 0.02 - 0.05 NG/ML      EKG is neg for acute changes  CXR is neg for acute changes    Pt waiting on VQ scan

## 2013-06-26 NOTE — ED Notes (Signed)
Did not have contact with this pt.

## 2013-06-27 LAB — CBC WITH AUTOMATED DIFF
ABS. BASOPHILS: 0 10*3/uL (ref 0.0–0.2)
ABS. EOSINOPHILS: 0.2 10*3/uL (ref 0.0–0.8)
ABS. IMM. GRANS.: 0 10*3/uL (ref 0.0–0.5)
ABS. LYMPHOCYTES: 1.9 10*3/uL (ref 0.5–4.6)
ABS. MONOCYTES: 0.5 10*3/uL (ref 0.1–1.3)
ABS. NEUTROPHILS: 3.2 10*3/uL (ref 1.7–8.2)
BASOPHILS: 0 % (ref 0.0–2.0)
EOSINOPHILS: 3 % (ref 0.5–7.8)
HCT: 34.7 % — ABNORMAL LOW (ref 35.8–46.3)
HGB: 11.7 g/dL (ref 11.7–15.4)
IMMATURE GRANULOCYTES: 0 % (ref 0.0–5.0)
LYMPHOCYTES: 32 % (ref 13–44)
MCH: 31.6 PG (ref 26.1–32.9)
MCHC: 33.7 g/dL (ref 31.4–35.0)
MCV: 93.8 FL (ref 79.6–97.8)
MONOCYTES: 9 % (ref 4.0–12.0)
MPV: 10 FL — ABNORMAL LOW (ref 10.8–14.1)
NEUTROPHILS: 56 % (ref 43–78)
PLATELET: 328 10*3/uL (ref 150–450)
RBC: 3.7 M/uL — ABNORMAL LOW (ref 4.05–5.25)
RDW: 13.7 % (ref 11.9–14.6)
WBC: 5.9 10*3/uL (ref 4.3–11.1)

## 2013-06-27 LAB — METABOLIC PANEL, COMPREHENSIVE
A-G Ratio: 1.2 (ref 1.2–3.5)
ALT (SGPT): 21 U/L (ref 12–65)
AST (SGOT): 16 U/L (ref 15–37)
Albumin: 4 g/dL (ref 3.5–5.0)
Alk. phosphatase: 106 U/L (ref 50–136)
Anion gap: 8 mmol/L (ref 7–16)
BUN: 12 MG/DL (ref 6–23)
Bilirubin, total: 0.3 MG/DL (ref 0.2–1.1)
CO2: 23 mmol/L (ref 21–32)
Calcium: 9.3 MG/DL (ref 8.3–10.4)
Chloride: 107 mmol/L (ref 98–107)
Creatinine: 0.82 MG/DL (ref 0.6–1.0)
GFR est AA: >60 mL/min/{1.73_m2} (ref >60)
GFR est non-AA: >60 mL/min/{1.73_m2} (ref >60)
Globulin: 3.4 g/dL (ref 2.3–3.5)
Glucose: 108 mg/dL — ABNORMAL HIGH (ref 65–100)
Potassium: 3.5 mmol/L (ref 3.5–5.1)
Protein, total: 7.4 g/dL (ref 6.3–8.2)
Sodium: 138 mmol/L (ref 136–145)

## 2013-06-27 LAB — EKG, 12 LEAD, INITIAL
Atrial Rate: 81 {beats}/min
Calculated P Axis: 71 degrees
Calculated R Axis: 88 degrees
Calculated T Axis: 76 degrees
P-R Interval: 162 ms
Q-T Interval: 358 ms
QRS Duration: 88 ms
QTC Calculation (Bezet): 415 ms
Ventricular Rate: 81 {beats}/min

## 2013-06-27 LAB — PROTHROMBIN TIME + INR
INR: 1 (ref 0.9–1.2)
Prothrombin time: 11.2 s (ref 9.6–12.0)

## 2013-06-27 LAB — TROPONIN I: Troponin-I, Qt.: <0.02 NG/ML — ABNORMAL LOW (ref 0.02–0.05)

## 2013-06-27 MED ORDER — METHYLPREDNISOLONE 4 MG TABS IN A DOSE PACK
4 mg | ORAL | Status: DC
Start: 2013-06-27 — End: 2013-07-18

## 2013-06-27 MED ORDER — NUCLEAR MEDICINE ISOTOPE
Freq: Once | Status: AC
Start: 2013-06-27 — End: 2013-06-27
  Administered 2013-06-27: 04:00:00

## 2013-06-27 MED ORDER — PROMETHAZINE 25 MG/ML INJECTION
25 mg/mL | INTRAMUSCULAR | Status: AC
Start: 2013-06-27 — End: 2013-06-27
  Administered 2013-06-27: 04:00:00 via INTRAVENOUS

## 2013-06-27 MED ORDER — TRAMADOL 50 MG TAB
50 mg | ORAL | Status: AC
Start: 2013-06-27 — End: 2013-06-27
  Administered 2013-06-27: 04:00:00 via ORAL

## 2013-06-27 MED ORDER — NUCLEAR MEDICINE ISOTOPE
Freq: Once | Status: AC
Start: 2013-06-27 — End: 2013-06-27
  Administered 2013-06-27: 05:00:00

## 2013-06-27 MED ORDER — HEPARIN, PORCINE (PF) 100 UNIT/ML IV SYRINGE
100 unit/mL | INTRAVENOUS | Status: DC | PRN
Start: 2013-06-27 — End: 2013-06-27
  Administered 2013-06-27: 06:00:00

## 2013-06-27 MED FILL — TRAMADOL 50 MG TAB: 50 mg | ORAL | Qty: 1

## 2013-06-27 MED FILL — MONOJECT PREFILL ADVANCED (PF) 100 UNIT/ML INTRAVENOUS SYRINGE: 100 unit/mL | INTRAVENOUS | Qty: 3

## 2013-06-27 MED FILL — PROMETHAZINE 25 MG/ML INJECTION: 25 mg/mL | INTRAMUSCULAR | Qty: 1

## 2013-06-27 NOTE — ED Notes (Signed)
Discharge instructions covered verbally.  Pt verbalized understanding.

## 2013-06-27 NOTE — ED Notes (Signed)
Returned.  VS and pain reassessment completed.

## 2013-06-27 NOTE — ED Notes (Signed)
To radiology via stretcher for exam

## 2013-07-07 NOTE — Telephone Encounter (Signed)
Need med refill    DULoxetine (CYMBALTA) 60 mg capsule  QUEtiapine (SEROQUEL) 300 mg tablet      SUMAtriptan (IMITREX) 100 mg tablet  (patient states Dr. B was going to change this migraine medicine since it isn't helping. Pheracet is what she thought she was going to change it too.

## 2013-07-18 MED ORDER — DULOXETINE 60 MG CAP, DELAYED RELEASE
60 mg | ORAL_CAPSULE | ORAL | Status: DC
Start: 2013-07-18 — End: 2015-05-18

## 2013-07-18 MED ORDER — ONDANSETRON 4 MG TAB, RAPID DISSOLVE
4 mg | ORAL_TABLET | Freq: Three times a day (TID) | ORAL | Status: DC | PRN
Start: 2013-07-18 — End: 2014-01-30

## 2013-07-18 MED ORDER — BUTALBITAL-ACETAMINOPHEN-CAFFEINE 50 MG-325 MG-40 MG TAB
50-325-40 mg | ORAL_TABLET | Freq: Four times a day (QID) | ORAL | Status: DC | PRN
Start: 2013-07-18 — End: 2013-08-05

## 2013-07-18 MED ORDER — PANTOPRAZOLE 40 MG TAB, DELAYED RELEASE
40 mg | ORAL_TABLET | Freq: Two times a day (BID) | ORAL | Status: DC
Start: 2013-07-18 — End: 2014-01-30

## 2013-07-18 MED ORDER — QUETIAPINE 300 MG TAB
300 mg | ORAL_TABLET | Freq: Every evening | ORAL | Status: DC
Start: 2013-07-18 — End: 2014-01-30

## 2013-07-18 NOTE — Progress Notes (Signed)
[facility]  [address]   [place], [place] [address]  Phone: [phone] Fax [phone]  Irma Newness, D.O.  07/18/2013   Chief Complaint   Patient presents with   ??? Follow-up   ??? Depression     History of Present Illness    Krista Lopez is a 48 y.o. female presenting today for depression follow up.     She complains of depressed mood, weight loss and insomnia. Onset was approximately 10 years ago, unchanged since that time. She  denies current suicidal and homicidal plan or intent.      Medication adjustment: none      She  complains of the following side effects from the treatment: none.    Psychiatry referral/doctor: needs referral  Last appointment: none    PHQ 2 / 9, over the last two weeks 07/18/2013   Little interest or pleasure in doing things Nearly every day   Feeling down, depressed or hopeless Nearly every day   Total Score PHQ 2 -   Trouble falling or staying asleep, or sleeping too much Nearly every day   Feeling tired or having little energy Several days   Poor appetite or overeating Not at all   Feeling bad about yourself - or that you are a failure or have let yourself or your family down More than half the days   Moving or speaking so slowly that other people could have noticed; or the opposite being so fidgety that others notice Not at all   Thoughts of being better off dead, or hurting yourself in some way Several days   How difficult have these problems made it for you to do your work, take care of your home and get along with others Somewhat difficult     PHQ 13    Pt is followed by hematology for coagulopathy - has been transitioned to Xarelto due to persistent history of supra/sub therapeutic INR. States that he has set her up for an Echo due to dyspnea.     Migraines - has gone to the ER on several occasions for migraines that have not been aborted with sumatriptan. Pt states that Fioricet had worked well for her when the gave it to her in the ER.      Bipolar/depression - pt states that she has not been able to see [facility] - states that they will not see her until [facility] sends over records. She is doing well with current medication. Denies any SI or HI today and contracts for safety.     She would like RF on her medications.      ==========================================================================  Last office visit: 12/06/13 with Dr Quentin Angst    1. Bipolar 2 disorder  New. Referral for psych made. Refilled pts medication.  Counsled patient to watch for potential side effects of this new medication including insomina, rashes, headahces, joint and muscle pain, stomach upset, nausea, diarrhea, and sexual disinterest, or increased risk of suicidal thoughts.       - REFERRAL TO PSYCHIATRY  - duloxetine (CYMBALTA) 30 mg capsule; Take 3 capsules by mouth daily. Indications: Bipolar Disorder Dispense: 90 capsule; Refill: 1  - quetiapine (SEROQUEL) 300 mg tablet; Take 1 tablet by mouth nightly. Dispense: 30 tablet; Refill: 1    2. Migraine  New. Stable. RF given.    - SUMAtriptan (IMITREX) 100 mg tablet; Take 1 tablet by mouth once as needed (can repeat in 1 hour if needed.)  for up to 1 dose. Dispense: 15 tablet; Refill: 1    3. Screening for lipoid disorders  Will screen and bring back for lab review. All other lab work is recent from recent ER visits.  - NMR LIPOPROFILE W/IR MARKERS; Future    Over 50% of today's office visit was spent in face to face time reviewing test results, prognosis, importance of compliance, education about disease process, benefits of medications, instructions for management of acute flare-ups, and follow up plans. Total face to face time spent with patient was 45 minutes.    Follow-up Disposition:  Return in about 3 months (around 03/08/2013) for follow up.  Mcclellan Demarais S Remmington Urieta, DO  ==============================================================    Current Problem List  Patient Active Problem List   Diagnosis Code   ??? 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 Depression 311   ??? Anxiety 300.00   ??? Patient noncompliance V15.81   ??? Bipolar 2 disorder (HCC) 296.89   ??? Migraine 346.90   ??? Restless leg syndrome 333.94   ??? Iron deficiency anemia 280.9   ??? 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 Supratherapeutic INR 790.92   ??? Coagulopathy (HCC) 286.9   ??? Epistaxis 784.7   ??? Melena 578.1   ??? Acute blood loss anemia 285.1   ??? Abdominal pain 789.00   ??? 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 Hematuria 599.70   ??? Hematoma 924.9   ??? 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 rivaroxaban (XARELTO) 20 mg tab tablet Take  by mouth daily.       ??? DULoxetine (CYMBALTA) 60 mg capsule Take 3 caps every day.  Indications: ANXIETY WITH DEPRESSION  90 Cap  0   ??? QUEtiapine (SEROQUEL) 300 mg tablet Take 1 Tab by mouth nightly.  30 Tab  0   ??? pantoprazole (PROTONIX) 40 mg tablet Take 1 Tab by mouth Before breakfast and dinner.  30 Tab  0   ??? ondansetron (ZOFRAN ODT) 4 mg disintegrating tablet Take 1 Tab by mouth every eight (8) hours as needed for Nausea.  30 Tab  0   ??? butalbital-acetaminophen-caffeine (FIORICET) 50-325-40 mg per tablet Take 1 Tab by mouth every six (6) hours as needed for Pain. Max Daily Amount: 4 Tabs.  20 Tab  0   ??? [DISCONTINUED] methylPREDNISolone (MEDROL, PAK,) 4 mg tablet Per dose pack instructions  1 Each  0   ??? promethazine (PHENERGAN) 25 mg tablet Take 1 Tab by mouth every six (6) hours as needed.  12 Tab  0   ??? traMADol (ULTRAM) 50 mg tablet Take 1 Tab by mouth every six (6) hours as needed for Pain. Max Daily Amount: 200 mg.  20 Tab  0   ??? [DISCONTINUED] enoxaparin (LOVENOX) 80 mg/0.8 mL injection     0   ??? clonazePAM (KLONOPIN) 1 mg tablet Take 1 mg by mouth three (3)  times daily.       ??? [DISCONTINUED] DULoxetine (CYMBALTA) 60 mg capsule Take 90 mg by mouth daily. Indications: ANXIETY WITH DEPRESSION       ??? [DISCONTINUED] QUEtiapine (SEROQUEL) 300 mg tablet Take 300 mg by mouth nightly.       ??? [  DISCONTINUED] pantoprazole (PROTONIX) 40 mg tablet Take 1 Tab by mouth Before breakfast and dinner.  60 Tab  1   ??? [DISCONTINUED] ondansetron (ZOFRAN ODT) 4 mg disintegrating tablet Take 1 Tab by mouth every eight (8) hours as needed for Nausea.  20 Tab  0   ??? SUMAtriptan (IMITREX) 100 mg tablet Take 100 mg by mouth once as needed for Migraine.         No facility-administered encounter medications on file as of 07/18/2013.       Allergies  Allergies   Allergen Reactions   ??? Ativan [Lorazepam] Itching   ??? Codeine Rash   ??? Demerol [Meperidine] Itching     *     ??? Iodinated Contrast Media - Iv Dye Rash     States also has swelling, and allergic to shellfish   ??? Ketorolac Tromethamine Rash   ??? Motrin [Ibuprofen] Other (comments)     Causes stomach upset after 3 doses.   ??? Pcn [Penicillins] Hives   ??? Pneumococcal Vaccine Swelling   ??? Pneumovax 23 [Pneumococcal 23-Val Ps Vaccine] Other (comments)     Local reaction   ??? Shellfish Containing Products Rash       Social History  History     Social History   ??? Marital Status: DIVORCED     Spouse Name: N/A     Number of Children: N/A   ??? Years of Education: N/A     Occupational History   ??? Not on file.     Social History Main Topics   ??? Smoking status: Never Smoker    ??? Smokeless tobacco: Never Used   ??? Alcohol Use: Yes      Comment: rarely - only drinks 2 times per year   ??? Drug Use: No      Comment: narcotic seeking per hospital hx.   ??? Sexual Activity:     Partners: Male     Birth Control/ Protection: Surgical     Other Topics Concern   ??? Not on file     Social History Narrative       Health Care Maintenance   Health Maintenance   Topic Date Due   ??? TDAP AGE > 18  07/23/1986   ??? Td Q 10 Yrs Age > 18  07/23/1986   ??? PAP AKA CERVICAL CYTOLOGY   07/22/1988   ??? INFLUENZA AGE 76 TO ADULT  09/10/2013       Review of Systems   Constitutional: Negative for fever, chills, weight loss, malaise/fatigue and diaphoresis.   HENT: Positive for congestion. Negative for ear discharge, ear pain, hearing loss, nosebleeds, sore throat and tinnitus.    Eyes: Negative for blurred vision, double vision, photophobia, pain, discharge and redness.   Respiratory: Negative for cough, hemoptysis, sputum production, shortness of breath, wheezing and stridor.    Cardiovascular: Negative for chest pain, palpitations, orthopnea, claudication, leg swelling and PND.   Gastrointestinal: Positive for heartburn. Negative for nausea, vomiting, abdominal pain, diarrhea, constipation, blood in stool and melena.   Genitourinary: Negative for dysuria, urgency, frequency, hematuria and flank pain.   Musculoskeletal: Negative for myalgias, back pain, joint pain, falls and neck pain.   Skin: Negative for itching and rash.   Neurological: Positive for headaches. Negative for dizziness, tingling, tremors, sensory change, speech change, focal weakness, seizures, loss of consciousness and weakness.   Endo/Heme/Allergies: Negative for environmental allergies and polydipsia. Bruises/bleeds easily.   Psychiatric/Behavioral: Positive for depression. Negative for suicidal ideas, hallucinations, memory  loss and substance abuse. The patient is not nervous/anxious and does not have insomnia.          Vital Signs  Filed Vitals:    07/18/13 1101   BP: 124/82   Pulse: 96   Temp: 98.6 ??F (37 ??C)   TempSrc: Oral   Resp: 15   Height: 5\' 4"  (1.626 m)   Weight: 134 lb 6.4 oz (60.963 kg)   SpO2: 95%   PainSc:   6   PainLoc: Head   LMP: 07/28/2004         Physical Exam      Assessment and Plan    ICD-9-CM    1. 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 Bipolar 2 disorder (HCC) 296.89 DULoxetine (CYMBALTA) 60 mg capsule     QUEtiapine (SEROQUEL) 300 mg tablet   3. 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 Depression 311 DULoxetine (CYMBALTA) 60 mg capsule     QUEtiapine (SEROQUEL) 300 mg tablet   6. Anxiety 300.00 DULoxetine (CYMBALTA) 60 mg capsule     QUEtiapine (SEROQUEL) 300 mg tablet   7. Migraine without status migrainosus, not intractable, unspecified migraine type 346.90 butalbital-acetaminophen-caffeine (FIORICET) 50-325-40 mg per tablet   8. Gastroesophageal reflux disease without esophagitis 530.81 pantoprazole (PROTONIX) 40 mg tablet   9. Nausea 787.02 ondansetron (ZOFRAN ODT) 4 mg disintegrating tablet     1. Pulmonary embolism, Recurrent  Stable. Pt is followed by Dr. Anner Crete - has been transitioned to Xarelto.     2. Bipolar 2 disorder (HCC)  Stable. Pt denies any SI or HI today. Contracts for safety.  Refill for medications sent - 1 month supply with no RF.     - DULoxetine (CYMBALTA) 60 mg capsule; Take 3 caps every day.  Indications: ANXIETY WITH DEPRESSION  Dispense: 90 Cap; Refill: 0  - QUEtiapine (SEROQUEL) 300 mg tablet; Take 1 Tab by mouth nightly.  Dispense: 30 Tab; Refill: 0    3. Factor V Leiden mutation (HCC)  Stable. See above     4. Factor II deficiency (HCC)  Stable. See above    5. Depression  Stable. See above       Counsled patient to watch for potential side effects of this new medication including insomina, rashes, headahces, joint and muscle pain, stomach upset, nausea, diarrhea, and sexual disinterest, or increased risk of suicidal thoughts.     - DULoxetine (CYMBALTA) 60 mg capsule; Take 3 caps every day.  Indications: ANXIETY WITH DEPRESSION  Dispense: 90 Cap; Refill: 0  - QUEtiapine (SEROQUEL) 300 mg tablet; Take 1 Tab by mouth nightly.  Dispense: 30 Tab; Refill: 0    6. Anxiety  Stable. Rx for klonipin not given. Pt needs to establish care with [facility] - advised to call back next week to discuss with our referral coordinator to assist with pt getting established (referral coordinator is out of office for the week).   - DULoxetine  (CYMBALTA) 60 mg capsule; Take 3 caps every day.  Indications: ANXIETY WITH DEPRESSION  Dispense: 90 Cap; Refill: 0  - QUEtiapine (SEROQUEL) 300 mg tablet; Take 1 Tab by mouth nightly.  Dispense: 30 Tab; Refill: 0    7. Migraine without status migrainosus, not intractable, unspecified migraine type  Stable. Rx for Fioricet given (#20 no RF).     - butalbital-acetaminophen-caffeine (FIORICET) 50-325-40 mg per tablet; Take 1 Tab by mouth every six (6) hours  as needed for Pain. Max Daily Amount: 4 Tabs.  Dispense: 20 Tab; Refill: 0    8. Gastroesophageal reflux disease without esophagitis  Stable. Refill for medications sent   - pantoprazole (PROTONIX) 40 mg tablet; Take 1 Tab by mouth Before breakfast and dinner.  Dispense: 30 Tab; Refill: 0    9. Nausea  Stable. Well controlled with Zofran. Refill for medications sent     - ondansetron (ZOFRAN ODT) 4 mg disintegrating tablet; Take 1 Tab by mouth every eight (8) hours as needed for Nausea.  Dispense: 30 Tab; Refill: 0      Pt notified that a dismissal letter has been sent to her address last week due to persistent no shows,same-day cancellations in same day rescheduling.  Have attempted to contact patient multiple times after ER visit for ER/hospital follow up with no return calls. Advised patient that we will provide care for her for 30 days since dismissal letter which will be August 10, 2013.  Encouraged patient to call her insurance company to get a list of providers that take her insurance for her establishment of care.  1 month refills given on medications.    All medications' purpose, side effects, and compliance discussed.Pt is able to self medicate without assistance. Encouraged patient to call if has any further questions or begins to experience side effects. Patient to return sooner if symptoms worsen or do not improve. Patient states understanding and  verbally agrees to current plan of care.      Dyrell Tuccillo S Jarrick Fjeld, DO

## 2013-07-18 NOTE — Progress Notes (Deleted)
[facility]  [address]   [place], [place] [address]  Phone: [phone] Fax [phone]  Krista NewnessLopa S. Lopez, D.O.  07/18/2013     No chief complaint on file.    Have you been to the ER/UC or been in the hospital since we last saw you? ***  Have you had any new medication allergies since we last saw you? ***  Have you had any new medications or changes in medications since we last saw you?***  Have you had any other doctor appointments since we last saw you?***  Do you need any refills today?***  Do you have any paperwork that needs to be completed today?***  Krista ParkinsLetitia Berlyn Saylor, MA      Krista AgeeShannon Lopez is a 48 y.o. female who presents today for Anticoagulation monitoring.    Indication: {indication for anticoagulation:17259}  INR Goal: {Inr target:12855}.  Current dose:  Coumadin ***mg daily.  Missed Coumadin Doses:  {none/this week/over HQIO:96295}week:12802}  Bleeding Signs/Symptoms: {None/mild/yes:12801}  Thromboembolic Signs/Symptoms: {None/mild/yes:12801}  Medication Changes:  {yes***/no:17258}  Dietary Changes:  {yes***/no:17258}  Bacterial/Viral Infections: {yes***/no:17258}    Other Concerns: {yes***/no:17258}    Symptoms: taking coumadin appropriately {findings; bleeding:13502}.  =========================================================================  Last office visit: 12/06/13 with Dr Quentin AngstBhansaly    1. Bipolar 2 disorder  New. Referral for psych made. Refilled pts medication.  Counsled patient to watch for potential side effects of this new medication including insomina, rashes, headahces, joint and muscle pain, stomach upset, nausea, diarrhea, and sexual disinterest, or increased risk of suicidal thoughts.       - REFERRAL TO PSYCHIATRY  - duloxetine (CYMBALTA) 30 mg capsule; Take 3 capsules by mouth daily. Indications: Bipolar Disorder Dispense: 90 capsule; Refill: 1  - quetiapine (SEROQUEL) 300 mg tablet; Take 1 tablet by mouth nightly. Dispense: 30 tablet; Refill: 1    2. Migraine   New. Stable. RF given.    - SUMAtriptan (IMITREX) 100 mg tablet; Take 1 tablet by mouth once as needed (can repeat in 1 hour if needed.) for up to 1 dose. Dispense: 15 tablet; Refill: 1    3. Screening for lipoid disorders  Will screen and bring back for lab review. All other lab work is recent from recent ER visits.  - NMR LIPOPROFILE W/IR MARKERS; Future    Over 50% of today's office visit was spent in face to face time reviewing test results, prognosis, importance of compliance, education about disease process, benefits of medications, instructions for management of acute flare-ups, and follow up plans. Total face to face time spent with patient was 45 minutes.    Follow-up Disposition:  Return in about 3 months (around 03/08/2013) for follow up.  Krista NewnessLOPA S BHANSALY, DO    Health Care Maintenance   Health Maintenance   Topic Date Due   ??? TDAP AGE > 18  07/23/1986   ??? Td Q 10 Yrs Age > 18  07/23/1986   ??? PAP AKA CERVICAL CYTOLOGY  07/22/1988   ??? INFLUENZA AGE 32 TO ADULT  09/10/2013       ROS      Vital Signs  There were no vitals filed for this visit.    Physical Exam    Constitutional: He is oriented to person, place, and time. He appears well-developed and well-nourished.   HENT:   Head: Normocephalic and atraumatic.   Nose: Nose normal.   Mouth/Throat: Oropharynx is clear and moist.   Eyes: Conjunctivae and EOM are normal. Pupils are equal, round,  and reactive to light.   Cardiovascular: Normal rate and regular rhythm.   Pulmonary/Chest: Effort normal and breath sounds normal.   Neurological: He is alert and oriented to person, place, and time.   Skin: Skin is warm and dry. No rash noted.   Psychiatric: He has a normal mood and affect. His behavior is normal. Judgment and thought content normal.         Latest INRs:  Lab Results   Component Value Date/Time    INR 1.0 06/26/2013  9:15 PM    INR >6.8 06/17/2013  3:15 AM    INR 1.0 06/04/2013  4:15 PM    Prothrombin time 11.2 06/26/2013  9:15 PM    Prothrombin time >63.0  06/17/2013  3:15 AM    Prothrombin time 10.9 06/04/2013  4:15 PM            Assessment/Plan:  {No Diagnosis Found}    New Coumadin dose:.        All medications' purpose, side effects, and compliance discussed.Pt is *** able to self medicate without assistance. Encouraged patient to call if has any further questions or begins to experience side effects. Patient to return sooner if symptoms worsen or do not improve. Patient states understanding and  verbally agrees to current plan of care.      Follow-up Disposition: Not on File  Trinda Harlacher M Myleigh Amara

## 2013-07-21 LAB — URINE MICROSCOPIC: RBC: >100 /hpf — ABNORMAL HIGH

## 2013-07-21 LAB — HCG URINE, QL. - POC: Pregnancy test,urine (POC): NEGATIVE

## 2013-07-21 MED ORDER — TRAMADOL 50 MG TAB
50 mg | ORAL | Status: AC
Start: 2013-07-21 — End: 2013-07-21
  Administered 2013-07-21: 21:00:00 via ORAL

## 2013-07-21 MED FILL — TRAMADOL 50 MG TAB: 50 mg | ORAL | Qty: 1

## 2013-07-21 NOTE — ED Notes (Signed)
I have reviewed discharge instructions with the patient.  The patient verbalized understanding.

## 2013-07-21 NOTE — ED Notes (Signed)
Pt to ct via gurney at this time.

## 2013-07-21 NOTE — ED Provider Notes (Addendum)
Patient is a 48 y.o. female presenting with flank pain. [name]   Flank Pain   This is a recurrent problem. The current episode started 6 to 12 hours ago. The problem has not changed since onset.The problem occurs constantly. Patient reports not work related injury.The pain is associated with no known injury. The pain is present in the right side and lower back. The quality of the pain is described as similar to previous episodes and stabbing. The pain does not radiate. The pain is at a severity of 7/10. The pain is the same all the time. Associated symptoms include abdominal pain. Pertinent negatives include no chest pain, no fever, no numbness, no weight loss, no headaches, no abdominal swelling, no bowel incontinence, no perianal numbness, no bladder incontinence, no dysuria, no pelvic pain, no leg pain, no paresthesias, no paresis, no tingling and no weakness. She has tried nothing for the symptoms. Risk factors: Chronic pain, Renal Stone.        Past Medical History   Diagnosis Date   ??? Factor V Leiden mutation (HCC)    ??? Infectious disease 02/2009      Hx MRSA / E.Coli bacteremia, MRSA wound infection 2/2 port   ??? Factor II deficiency (HCC)    ??? Calculus of kidney    ??? Depression    ??? Iron deficiency    ??? Migraines    ??? 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 Bipolar disorder (HCC)    ??? Anxiety    ??? Restless leg syndrome         Past Surgical History   Procedure Laterality Date   ??? Cystoscopy  ???     x 13   ??? Lithotripsy  2006   ??? Hx vascular access  July, 2010     Power Port placed in [place], [place] removed   ??? Hx vascular access Right 2013     pt has current rt subclavian port - placed at [facility]   ??? Hx appendectomy  ???   ??? Hx tonsillectomy     ??? Hx cesarean section     ??? Hx tubal ligation     ??? Hx ovarian cyst removal       on R         Family History   Problem Relation Age of Onset   ??? Bleeding Prob Father       Factor V   ??? Cancer Maternal Grandmother      metastatic breast CA.   ??? Ovarian Cancer Mother 87     passed away due to ca   ??? Bipolar Disorder Mother    ??? Kidney Disease Brother      kidney stones        History     Social History   ??? Marital Status: DIVORCED     Spouse Name: N/A     Number of Children: N/A   ??? Years of Education: N/A     Occupational History   ??? Not on file.     Social History Main Topics   ??? Smoking status: Never Smoker    ??? Smokeless tobacco: Never Used   ??? Alcohol Use: Yes      Comment: rarely - only drinks 2 times per year   ??? Drug Use: No      Comment: narcotic seeking per hospital hx.   ???  Sexual Activity:     Partners: Male     Birth Control/ Protection: Surgical     Other Topics Concern   ??? Not on file     Social History Narrative                  ALLERGIES: Ativan; Codeine; Demerol; Iodinated contrast media - iv dye; Ketorolac tromethamine; Motrin; Pcn; Pneumococcal vaccine; Pneumovax 23; and Shellfish containing products      Review of Systems   Constitutional: Negative for fever and weight loss.   Cardiovascular: Negative for chest pain.   Gastrointestinal: Positive for nausea and abdominal pain. Negative for bowel incontinence.   Genitourinary: Positive for hematuria and flank pain. Negative for bladder incontinence, dysuria and pelvic pain.   Neurological: Negative for tingling, weakness, numbness, headaches and paresthesias.   All other systems reviewed and are negative.      Filed Vitals:    07/21/13 1430 07/21/13 1600   BP: 131/93    Pulse: 108    Temp: 99.6 ??F (37.6 ??C)    Resp: 17    Height: 5\' 4"  (1.626 m)    Weight: 63.957 kg (141 lb)    SpO2: 99% 99%            Physical Exam   Constitutional: She is oriented to person, place, and time. Vital signs are normal. She appears well-developed and well-nourished. She is cooperative.  Non-toxic appearance. She does not have a sickly appearance. She does not appear ill. No distress.   HENT:   Head: Normocephalic and atraumatic.    Right Ear: External ear normal.   Left Ear: External ear normal.   Nose: Nose normal.   Mouth/Throat: Oropharynx is clear and moist.   Eyes: Conjunctivae and EOM are normal. No scleral icterus.   Neck: Normal range of motion and full passive range of motion without pain. Neck supple.   Cardiovascular: Normal rate, regular rhythm, intact distal pulses and normal pulses.    Pulmonary/Chest: Effort normal and breath sounds normal. No respiratory distress.   Abdominal: Soft. Normal appearance. She exhibits no distension, no fluid wave, no ascites and no mass. There is no hepatosplenomegaly. There is tenderness in the right lower quadrant. There is no rigidity, no rebound, no guarding and no CVA tenderness.   Musculoskeletal: Normal range of motion. She exhibits no tenderness.   Neurological: She is alert and oriented to person, place, and time. She has normal strength. No sensory deficit.   Skin: Skin is warm, dry and intact. No rash noted. She is not diaphoretic. No pallor.   Psychiatric: Her speech is normal and behavior is normal. Judgment and thought content normal. Her mood appears anxious. Cognition and memory are normal.   Nursing note and vitals reviewed.       MDM    Procedures    Recent Results (from the past 24 hour(s))   URINE MICROSCOPIC    Collection Time     07/21/13  3:55 PM       Result Value Ref Range    WBC 5-10  0 /hpf    RBC >100 (*) 0 /hpf    Epithelial cells 0-3  0 /hpf    Bacteria TRACE  0 /hpf    Casts 0-3  0 /lpf   HCG URINE, QL. - POC    Collection Time     07/21/13  3:59 PM       Result Value Ref Range    Pregnancy test,urine (POC) NEGATIVE  NEG        CT urogram shows a renal Stone but no Ureteral stones    Assessment: R flank pain, hematuria prob 2' to [employer]     Plan: urology follow up

## 2013-07-21 NOTE — ED Notes (Signed)
Pt reports right flank pain with nausea and dysuria.

## 2013-07-24 LAB — CULTURE, URINE
Culture result:: 10000
Culture: 10000

## 2013-08-05 LAB — AMB POC URINALYSIS DIP STICK AUTO W/ MICRO (PGU)
Glucose (UA POC): NEGATIVE mg/dL
Ketones (UA POC): NEGATIVE
Nitrites (UA POC): NEGATIVE
Protein (UA POC): 100
Specific gravity (UA POC): 1.025 (ref 1.001–1.035)
Urobilinogen (POC): 0.2
pH (UA POC): 6 (ref 4.6–8.0)

## 2013-08-05 NOTE — Progress Notes (Signed)
[name] [id number]  [phone]    Krista AgeeShannon Lopez  DOB: 1965/11/05    Chief Complaint   Patient presents with   ??? Gross Hematuria     New patient          HPI     Krista AgeeShannon Lopez is a 48 y.o. female who presents to office for evaluation of gross hematuria referred by Dr Anola GurneyWalls.  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  Patient started having gross hematuria on 07/17/13 and the gross hematuria has been consistent since that time. She denies any associated back pain however admits to pelvic pressure.  She no longer has periods.   She denies any associated N/V/F/C.  Denies any frequency & urgency.  07/21/13 CT urogram showed a 7 mm non-obstructing Right LP renal stone.  No ureteral stones or obstruction noted.  This renal stone is NOT a new finding.     Patient has been seen in this practice in the past by several of our physicians.  Last seen in 2011 for kidney stones.         Past Medical History   Diagnosis Date   ??? Factor V Leiden mutation (HCC)    ??? Infectious disease 02/2009      Hx MRSA / E.Coli bacteremia, MRSA wound infection 2/2 port   ??? Factor II deficiency (HCC)    ??? Calculus of kidney    ??? Depression    ??? Iron deficiency    ??? Migraines    ??? 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 Bipolar disorder (HCC)    ??? Anxiety    ??? Restless leg syndrome      Past Surgical History   Procedure Laterality Date   ??? Cystoscopy  ???     x 13   ??? Lithotripsy  2006   ??? Hx vascular access  July, 2010     [place]   ??? Hx vascular access Right 2013     pt has current [facility]   ??? Hx appendectomy  ???   ??? Hx tonsillectomy     ??? Hx cesarean section     ??? Hx tubal ligation     ??? Hx ovarian cyst removal       on R     Current Outpatient Prescriptions   Medication Sig Dispense Refill    ??? enoxaparin (LOVENOX) 100 mg/mL   0   ??? rivaroxaban (XARELTO) 20 mg tab tablet Take  by mouth daily.     ??? DULoxetine (CYMBALTA) 60 mg capsule Take 3 caps every day.  Indications: ANXIETY WITH DEPRESSION 90 Cap 0   ??? QUEtiapine (SEROQUEL) 300 mg tablet Take 1 Tab by mouth nightly. 30 Tab 0   ??? pantoprazole (PROTONIX) 40 mg tablet Take 1 Tab by mouth Before breakfast and dinner. 30 Tab 0   ??? ondansetron (ZOFRAN ODT) 4 mg disintegrating tablet Take 1 Tab by mouth every eight (8) hours as needed for Nausea. 30 Tab 0   ??? clonazePAM (KLONOPIN) 1 mg tablet Take 1 mg by mouth three (3) times daily.     ??? SUMAtriptan (IMITREX) 100 mg tablet Take 100 mg by mouth once as needed for Migraine.       Allergies   Allergen  Reactions   ??? Ativan [Lorazepam] Itching   ??? Codeine Rash   ??? Demerol [Meperidine] Itching     *     ??? Iodinated Contrast Media - Iv Dye Rash     States also has swelling, and allergic to shellfish   ??? Ketorolac Tromethamine Rash   ??? Motrin [Ibuprofen] Other (comments)     Causes stomach upset after 3 doses.   ??? Pcn [Penicillins] Hives   ??? Pneumococcal Vaccine Swelling   ??? Pneumovax 23 [Pneumococcal 23-Val Ps Vaccine] Other (comments)     Local reaction   ??? Shellfish Containing Products Rash     History     Social History   ??? Marital Status: DIVORCED     Spouse Name: N/A     Number of Children: N/A   ??? Years of Education: N/A     Occupational History   ??? Not on file.     Social History Main Topics   ??? Smoking status: Never Smoker    ??? Smokeless tobacco: Never Used   ??? Alcohol Use: Yes      Comment: rarely - only drinks 2 times per year   ??? Drug Use: No      Comment: narcotic seeking per hospital hx.   ??? Sexual Activity:     Partners: Male     Birth Control/ Protection: Surgical     Other Topics Concern   ??? Not on file     Social History Narrative     Family History   Problem Relation Age of Onset   ??? Bleeding Prob Father      Factor V   ??? Cancer Maternal Grandmother      metastatic breast CA.    ??? Ovarian Cancer Mother [id number]     passed away due to ca   ??? Bipolar Disorder Mother    ??? Kidney Disease Brother      kidney stones       Review of Systems  Constitutional: Positive for headaches.  Skin: Negative skin ROS  Eyes: Eyes negative  Respiratory:        Blood clots in lung  Cardiovascular: Neg cardio ROS  GI: Positive for nausea, vomiting and abdominal pain.  Genitourinary: Positive for urinary burning, hematuria, history of urolithiasis and straining.Number of pregnancies: 2.  Number of births: 2.  Musculoskeletal: Positive for back pain.  Neurological: Neg neuro ROS  Psychological: Positive for depression.  Endocrine: Endocrine negative  Hem/Lymphatic:        Blood clotting problems      Urinalysis  UA - Dipstick  Results for orders placed or performed in visit on 08/05/13   AMB POC URINALYSIS DIP STICK AUTO W/ MICRO (PGU)     Status: None   Result Value Ref Range Status    Glucose (UA POC) Negative Negative mg/dL Final    Bilirubin (UA POC) Small Negative Final    Ketones (UA POC) Negative Negative Final    Specific gravity (UA POC) 1.025 1.001 - 1.035 Final    Blood (UA POC) Large Negative Final    pH (UA POC) 6.0 4.6 - 8.0 Final    Protein (UA POC) 100  Negative Final    Urobilinogen (POC) 0.2 mg/dL  Final    Nitrites (UA POC) Negative Negative Final    Leukocyte esterase (UA POC) Trace Negative Final       UA - Micro  WBC - 0  RBC - TNTC  Bacteria - 0  Epith - 0    Patient placed in supine position and the urethral meatus was cleansed with antiseptic. 14 fr non-indwelling catheter was placed without incident.  Urine returned from the catheter no bleeding, no resistance.  Catheter was removed and the patient tolerated the procedure well.  [email or web address]    PHYSICAL EXAM    Filed Vitals:    08/05/13 0950   BP: 139/95   Pulse: 67   Temp: 97.1 ??F (36.2 ??C)   TempSrc: Tympanic   Height: 5\' 4"  (1.626 m)   Weight: 138 lb (62.596 kg)       General appearance - alert, well appearing, and in no distress   Mental status - alert, oriented to person, place, and time  Eyes - Bilaterally normal & regular  Nose - normal and patent, no erythema, discharge   Neck - supple, no significant adenopathy  Chest/Lung-  Quiet, even and easy respiratory effort without use of accessory muscles  Abdomen - soft, nontender, nondistended, no masses or organomegaly  Back exam - full range of motion, no tenderness  Neurological - alert, oriented, normal speech, no focal findings or movement disorder noted on gross visual exam  Musculoskeletal - normal gait and station  Extremities - normal full range of motion of all extremities  Skin - normal coloration and turgor, no rashes        PLAN  Gross hematuria noted today  Sent urine culture  RTO for cysto to further evaluate gross hematuria likely related to new start of Xarelto.  Patient had CT urogram on 07/21/13 showing only a RLP renal stone        Assessment and Plan    ICD-9-CM    1. Gross hematuria 599.71 AMB POC URINALYSIS DIP STICK AUTO W/ MICRO (PGU)     CATHETERIZE FOR URINE SPEC     URINE CULTURE,COMPREHENSIVE   2. Calculus of kidney 592.0          Lucky Cowboy, WHNP-BC  Time Spent with patient:  20 Minutes (>50% of this time was spent on counseling this patient about hematuria)  Supervising Physician: Dr Katrinka Blazing

## 2013-08-08 LAB — URINE CULTURE,COMPREHENSIVE

## 2013-08-08 MED ORDER — TRIMETHOPRIM-SULFAMETHOXAZOLE 160 MG-800 MG TAB
160-800 mg | ORAL_TABLET | Freq: Two times a day (BID) | ORAL | Status: DC
Start: 2013-08-08 — End: 2013-08-17

## 2013-08-08 NOTE — Progress Notes (Signed)
Quick Note:        Spoke with patient and advised that she needs to stop the Levaquin and start the Bactrim. Also had her schedule a specimen check on July 8th to see if she is clear before her cysto.    ______

## 2013-08-08 NOTE — Addendum Note (Signed)
Addended by: Josie SaundersGIBSON, Brithney Bensen M on: 08/08/2013 08:13 AM      Modules accepted: Orders

## 2013-08-10 NOTE — ED Notes (Signed)
headache

## 2013-08-10 NOTE — ED Notes (Signed)
I have reviewed discharge instructions with the patient.  The patient verbalized understanding of instructions and prescription (1). Pt requested medicare Zenaida Niecevan and then changed mind and requested to call own taxi. Pt ambulated out in nad

## 2013-08-10 NOTE — ED Provider Notes (Signed)
HPI Comments: Patient with a history of migraines.  Having a right-sided frontal headache similar to previous episodes.  No relief with Imitrex yesterday.  Out of Imitrex today.  Having some nausea and vomiting with it.    Patient is a 48 y.o. female presenting with headaches. [name] No language interpreter was used.   Headache   This is a new problem. The current episode started 2 days ago. The problem occurs constantly. The problem has been gradually worsening. The headache is aggravated by nothing. The pain is located in the right unilateral and frontal region. The quality of the pain is described as throbbing. The pain is moderate. Associated symptoms include nausea and vomiting. Pertinent negatives include no fever, no malaise/fatigue, no chest pressure, no orthopnea, no palpitations, no shortness of breath, no weakness, no tingling, no dizziness and no visual change. Treatments tried: imitrex. The treatment provided no relief.        Past Medical History   Diagnosis Date   ??? Factor V Leiden mutation ([place])    ??? Infectious disease 02/2009      Hx MRSA / E.Coli bacteremia, MRSA wound infection 2/2 port   ??? Factor II deficiency ([place])    ??? Calculus of kidney    ??? Depression    ??? Iron deficiency    ??? Migraines    ??? 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 Bipolar disorder ([place])    ??? Anxiety    ??? Restless leg syndrome         Past Surgical History   Procedure Laterality Date   ??? Cystoscopy  ???     x 13   ??? Lithotripsy  2006   ??? Hx vascular access  July, 2010     Power Port placed in [place], [place]., removed   ??? Hx vascular access Right 2013     pt has current rt subclavian port - placed at [facility]   ??? Hx appendectomy  ???   ??? Hx tonsillectomy     ??? Hx cesarean section     ??? Hx tubal ligation     ??? Hx ovarian cyst removal       on R         Family History   Problem Relation Age of Onset   ??? Bleeding Prob Father       Factor V   ??? Cancer Maternal Grandmother      metastatic breast CA.   ??? Ovarian Cancer Mother 89     passed away due to ca   ??? Bipolar Disorder Mother    ??? Kidney Disease Brother      kidney stones        History     Social History   ??? Marital Status: DIVORCED     Spouse Name: N/A     Number of Children: N/A   ??? Years of Education: N/A     Occupational History   ??? Not on file.     Social History Main Topics   ??? Smoking status: Never Smoker    ??? Smokeless tobacco: Never Used   ??? Alcohol Use: Yes      Comment: rarely - only drinks 2 times per year   ??? Drug Use: No      Comment: narcotic seeking per hospital hx.   ??? Sexual Activity:     Partners: Male  Birth Control/ Protection: Surgical     Other Topics Concern   ??? Not on file     Social History Narrative                  ALLERGIES: Ativan; Codeine; Demerol; Iodinated contrast media - iv dye; Ketorolac tromethamine; Motrin; Pcn; Pneumococcal vaccine; Pneumovax 23; and Shellfish containing products      Review of Systems   Constitutional: Negative for fever, chills and malaise/fatigue.   HENT: Negative for rhinorrhea and sore throat.    Eyes: Negative for pain and redness.   Respiratory: Negative for chest tightness, shortness of breath and wheezing.    Cardiovascular: Negative for chest pain, palpitations, orthopnea and leg swelling.   Gastrointestinal: Positive for nausea and vomiting. Negative for abdominal pain and diarrhea.   Genitourinary: Positive for hematuria. Negative for dysuria, vaginal bleeding and vaginal discharge.   Musculoskeletal: Negative for back pain, gait problem, neck pain and neck stiffness.   Skin: Negative for color change and rash.   Neurological: Positive for headaches. Negative for dizziness, tingling, weakness, light-headedness and numbness.       Filed Vitals:    08/10/13 1826   BP: 110/65   Pulse: 96   Temp: 99.6 ??F (37.6 ??C)   Resp: 18   Height: $Remove'5\' 4"'OLqbJVF$  (1.626 m)   Weight: 64.411 kg (142 lb)   SpO2: 96%            Physical Exam    Constitutional: She is oriented to person, place, and time. She appears well-developed and well-nourished. No distress.   HENT:   Head: Normocephalic and atraumatic.   Eyes: Conjunctivae and EOM are normal. Pupils are equal, round, and reactive to light.   Neck: Normal range of motion. Neck supple.   Cardiovascular: Normal rate and regular rhythm.    No murmur heard.  Pulmonary/Chest: Effort normal and breath sounds normal. She has no wheezes.   Abdominal: Soft. Bowel sounds are normal. There is no tenderness.   Musculoskeletal: Normal range of motion. She exhibits no edema.   Neurological: She is alert and oriented to person, place, and time. No cranial nerve deficit. She exhibits normal muscle tone. Coordination normal.   Skin: Skin is warm and dry.   Nursing note and vitals reviewed.       MDM  Number of Diagnoses or Management Options  Headache(784.0):   Diagnosis management comments: Urine likely contaminated. Will hold off on treatment with pt seeing urology on the 10th.       Amount and/or Complexity of Data Reviewed  Clinical lab tests: ordered and reviewed  Tests in the radiology section of CPT??: ordered and reviewed  Tests in the medicine section of CPT??: ordered and reviewed    Risk of Complications, Morbidity, and/or Mortality  Presenting problems: moderate  Diagnostic procedures: moderate  Management options: moderate    Patient Progress  Patient progress: stable      Procedures    Results Include:    Recent Results (from the past 24 hour(s))   URINE MICROSCOPIC    Collection Time: 08/10/13  9:10 PM   Result Value Ref Range    WBC 20-50 0 /hpf    RBC >100 0 /hpf    Epithelial cells 5-10 0 /hpf    Bacteria 1+ (H) 0 /hpf    Casts 5-10 0 /lpf    Crystals 0 0 /LPF    Amorphous Crystals TRACE 0      Mucus 0 0 /lpf  Other observations RESULTS VERIFIED MANUALLY     CBC WITH AUTOMATED DIFF    Collection Time: 08/10/13  9:34 PM   Result Value Ref Range    WBC 5.4 4.3 - 11.1 K/uL     RBC 3.86 (L) 4.05 - 5.25 M/uL    HGB 11.7 11.7 - 15.4 g/dL    HCT 90.3 (L) 79.5 - 46.3 %    MCV 91.7 79.6 - 97.8 FL    MCH 30.3 26.1 - 32.9 PG    MCHC 33.1 31.4 - 35.0 g/dL    RDW 58.3 16.7 - 42.5 %    PLATELET 273 150 - 450 K/uL    MPV 10.6 (L) 10.8 - 14.1 FL    DF AUTOMATED      NEUTROPHILS 48 43 - 78 %    LYMPHOCYTES 32 13 - 44 %    MONOCYTES 15 (H) 4.0 - 12.0 %    EOSINOPHILS 5 0.5 - 7.8 %    BASOPHILS 0 0.0 - 2.0 %    IMMATURE GRANULOCYTES 0.2 0.0 - 5.0 %    ABS. NEUTROPHILS 2.6 1.7 - 8.2 K/UL    ABS. LYMPHOCYTES 1.7 0.5 - 4.6 K/UL    ABS. MONOCYTES 0.8 0.1 - 1.3 K/UL    ABS. EOSINOPHILS 0.3 0.0 - 0.8 K/UL    ABS. BASOPHILS 0.0 0.0 - 0.2 K/UL    ABS. IMM. GRANS. 0.0 0.0 - 0.5 K/UL   METABOLIC PANEL, COMPREHENSIVE    Collection Time: 08/10/13  9:34 PM   Result Value Ref Range    Sodium 142 136 - 145 mmol/L    Potassium 3.8 3.5 - 5.1 mmol/L    Chloride 105 98 - 107 mmol/L    CO2 28 21 - 32 mmol/L    Anion gap 9 7 - 16 mmol/L    Glucose 114 (H) 65 - 100 mg/dL    BUN 11 6 - 23 MG/DL    Creatinine 5.25 0.6 - 1.0 MG/DL    GFR est AA >89 >48 XA/FHS/3.07O6    GFR est non-AA >60 >60 ml/min/1.22m2    Calcium 8.9 8.3 - 10.4 MG/DL    Bilirubin, total 0.3 0.2 - 1.1 MG/DL    ALT 34 12 - 65 U/L    AST 19 15 - 37 U/L    Alk. phosphatase 138 (H) 50 - 136 U/L    Protein, total 7.5 6.3 - 8.2 g/dL    Albumin 3.4 (L) 3.5 - 5.0 g/dL    Globulin 4.1 (H) 2.3 - 3.5 g/dL    A-G Ratio 0.8 (L) 1.2 - 3.5     PROTHROMBIN TIME + INR    Collection Time: 08/10/13  9:34 PM   Result Value Ref Range    Prothrombin time 10.3 9.6 - 12.0 sec    INR 1.0 0.9 - 1.2        CT HEAD WO CONT (Final result) Result time: 08/10/13 21:56:41    Final result by Christoper Fabian, MD (08/10/13 21:56:41)    Impression:    IMPRESSION: Negative head CT      Narrative:    CT of the head without contrast.    CLINICAL INDICATION: Headache    PROCEDURE: Serial thin section axial images are obtained from the cranial vertex   through the skull base without the administration of intravenous contrast.    COMPARISON: Head CT dated 04/26/2013.    FINDINGS: There is no acute intracranial hemorrhage, mass, or mass effect. No  abnormal  extra-axial fluid collections identified. There is no hydrocephalus.  The basilar cisterns are widely patent. The gray-white matter brain parenchymal  interface is well-maintained. No skull fracture or aggressive osseous lesion  noted. The mastoid air cells and included paranasal sinuses are clear.

## 2013-08-11 LAB — PROTHROMBIN TIME + INR
INR: 1 (ref 0.9–1.2)
Prothrombin time: 10.3 s (ref 9.6–12.0)

## 2013-08-11 LAB — METABOLIC PANEL, COMPREHENSIVE
A-G Ratio: 0.8 — ABNORMAL LOW (ref 1.2–3.5)
ALT (SGPT): 34 U/L (ref 12–65)
AST (SGOT): 19 U/L (ref 15–37)
Albumin: 3.4 g/dL — ABNORMAL LOW (ref 3.5–5.0)
Alk. phosphatase: 138 U/L — ABNORMAL HIGH (ref 50–136)
Anion gap: 9 mmol/L (ref 7–16)
BUN: 11 MG/DL (ref 6–23)
Bilirubin, total: 0.3 MG/DL (ref 0.2–1.1)
CO2: 28 mmol/L (ref 21–32)
Calcium: 8.9 MG/DL (ref 8.3–10.4)
Chloride: 105 mmol/L (ref 98–107)
Creatinine: 0.8 MG/DL (ref 0.6–1.0)
GFR est AA: >60 mL/min/{1.73_m2} (ref >60)
GFR est non-AA: >60 mL/min/{1.73_m2} (ref >60)
Globulin: 4.1 g/dL — ABNORMAL HIGH (ref 2.3–3.5)
Glucose: 114 mg/dL — ABNORMAL HIGH (ref 65–100)
Potassium: 3.8 mmol/L (ref 3.5–5.1)
Protein, total: 7.5 g/dL (ref 6.3–8.2)
Sodium: 142 mmol/L (ref 136–145)

## 2013-08-11 LAB — CBC WITH AUTOMATED DIFF
ABS. BASOPHILS: 0 10*3/uL (ref 0.0–0.2)
ABS. EOSINOPHILS: 0.3 10*3/uL (ref 0.0–0.8)
ABS. IMM. GRANS.: 0 10*3/uL (ref 0.0–0.5)
ABS. LYMPHOCYTES: 1.7 10*3/uL (ref 0.5–4.6)
ABS. MONOCYTES: 0.8 10*3/uL (ref 0.1–1.3)
ABS. NEUTROPHILS: 2.6 10*3/uL (ref 1.7–8.2)
BASOPHILS: 0 % (ref 0.0–2.0)
EOSINOPHILS: 5 % (ref 0.5–7.8)
HCT: 35.4 % — ABNORMAL LOW (ref 35.8–46.3)
HGB: 11.7 g/dL (ref 11.7–15.4)
IMMATURE GRANULOCYTES: 0.2 % (ref 0.0–5.0)
LYMPHOCYTES: 32 % (ref 13–44)
MCH: 30.3 PG (ref 26.1–32.9)
MCHC: 33.1 g/dL (ref 31.4–35.0)
MCV: 91.7 FL (ref 79.6–97.8)
MONOCYTES: 15 % — ABNORMAL HIGH (ref 4.0–12.0)
MPV: 10.6 FL — ABNORMAL LOW (ref 10.8–14.1)
NEUTROPHILS: 48 % (ref 43–78)
PLATELET: 273 10*3/uL (ref 150–450)
RBC: 3.86 M/uL — ABNORMAL LOW (ref 4.05–5.25)
RDW: 13.4 % (ref 11.9–14.6)
WBC: 5.4 10*3/uL (ref 4.3–11.1)

## 2013-08-11 LAB — URINE MICROSCOPIC
Crystals, urine: 0 /LPF
Mucus: 0 /lpf
RBC: >100 /hpf

## 2013-08-11 MED ORDER — DIPHENHYDRAMINE HCL 50 MG/ML IJ SOLN
50 mg/mL | INTRAMUSCULAR | Status: AC
Start: 2013-08-11 — End: 2013-08-11
  Administered 2013-08-11: 18:00:00 via INTRAVENOUS

## 2013-08-11 MED ORDER — HEPARIN, PORCINE (PF) 100 UNIT/ML IV SYRINGE
100 unit/mL | INTRAVENOUS | Status: DC | PRN
Start: 2013-08-11 — End: 2013-08-11
  Administered 2013-08-11: 20:00:00

## 2013-08-11 MED ORDER — DIPHENHYDRAMINE HCL 50 MG/ML IJ SOLN
50 mg/mL | INTRAMUSCULAR | Status: DC
Start: 2013-08-11 — End: 2013-08-11

## 2013-08-11 MED ORDER — MORPHINE 2 MG/ML INJECTION
2 mg/mL | INTRAMUSCULAR | Status: AC
Start: 2013-08-11 — End: 2013-08-11
  Administered 2013-08-11: 18:00:00 via INTRAVENOUS

## 2013-08-11 MED ORDER — MORPHINE 2 MG/ML INJECTION
2 mg/mL | INTRAMUSCULAR | Status: AC
Start: 2013-08-11 — End: 2013-08-11
  Administered 2013-08-11: 19:00:00 via INTRAVENOUS

## 2013-08-11 MED ORDER — SUMATRIPTAN 100 MG TAB
100 mg | ORAL_TABLET | Freq: Once | ORAL | Status: DC | PRN
Start: 2013-08-11 — End: 2014-11-17

## 2013-08-11 MED ORDER — SODIUM CHLORIDE 0.9% BOLUS IV
0.9 % | Freq: Once | INTRAVENOUS | Status: AC
Start: 2013-08-11 — End: 2013-08-10
  Administered 2013-08-11: 02:00:00 via INTRAVENOUS

## 2013-08-11 MED ORDER — HEPARIN, PORCINE (PF) 100 UNIT/ML IV SYRINGE
100 unit/mL | INTRAVENOUS | Status: DC | PRN
Start: 2013-08-11 — End: 2013-08-11
  Administered 2013-08-11: 03:00:00

## 2013-08-11 MED ORDER — MORPHINE 4 MG/ML SYRINGE
4 mg/mL | INTRAMUSCULAR | Status: DC
Start: 2013-08-11 — End: 2013-08-10

## 2013-08-11 MED ORDER — PROCHLORPERAZINE EDISYLATE 5 MG/ML INJECTION
5 mg/mL | INTRAMUSCULAR | Status: AC
Start: 2013-08-11 — End: 2013-08-10
  Administered 2013-08-11: 02:00:00 via INTRAVENOUS

## 2013-08-11 MED ORDER — SODIUM CHLORIDE 0.9 % IV
INTRAVENOUS | Status: DC
Start: 2013-08-11 — End: 2013-08-11
  Administered 2013-08-11: 18:00:00 via INTRAVENOUS

## 2013-08-11 MED ORDER — PROMETHAZINE 25 MG/ML INJECTION
25 mg/mL | INTRAMUSCULAR | Status: AC
Start: 2013-08-11 — End: 2013-08-11
  Administered 2013-08-11: 18:00:00 via INTRAVENOUS

## 2013-08-11 MED ADMIN — morphine 10 mg/mL injection 6 mg: INTRAVENOUS | @ 01:00:00 | NDC 00641607001

## 2013-08-11 MED FILL — MORPHINE 10 MG/ML IJ SOLN: 10 mg/mL | INTRAMUSCULAR | Qty: 1

## 2013-08-11 MED FILL — MORPHINE 2 MG/ML INJECTION: 2 mg/mL | INTRAMUSCULAR | Qty: 1

## 2013-08-11 MED FILL — PROMETHAZINE 25 MG/ML INJECTION: 25 mg/mL | INTRAMUSCULAR | Qty: 1

## 2013-08-11 MED FILL — MONOJECT PREFILL ADVANCED (PF) 100 UNIT/ML INTRAVENOUS SYRINGE: 100 unit/mL | INTRAVENOUS | Qty: 3

## 2013-08-11 MED FILL — PROCHLORPERAZINE EDISYLATE 5 MG/ML INJECTION: 5 mg/mL | INTRAMUSCULAR | Qty: 2

## 2013-08-11 MED FILL — DIPHENHYDRAMINE HCL 50 MG/ML IJ SOLN: 50 mg/mL | INTRAMUSCULAR | Qty: 1

## 2013-08-11 NOTE — ED Notes (Addendum)
I have reviewed medications, follow up provider options, and discharge instructions with the patient. The patient verbalized understanding. Copy of discharge information given to patient upon discharge. Prescription(s) given to patient. Patient discharged in no distress. Patient instructed not to drive while under influence of IV sedating medications given in the ER.

## 2013-08-11 NOTE — ED Provider Notes (Addendum)
HPI Comments: 48 year old female to the ED today complaining with right sided headache.  She was seen in the emergency department last night reports that her headache did improve after treatment however today her headache has returned.  She is squinting she reports that the lights make her headache worse.  His right side involving her right eye.  She's also had nausea and vomiting.  No fever no chills no cough no congestion no urinary symptoms    Patient is a 48 y.o. female presenting with migraines. [name] No language interpreter was used.   Migraine   This is a recurrent problem. The current episode started 12 to 24 hours ago. The problem occurs every few hours. The headache is aggravated by bright light, photophobia, vomiting and nausea. The pain is located in the right unilateral region. The quality of the pain is described as sharp and throbbing. Associated symptoms include anorexia, weakness, dizziness, nausea and vomiting. Pertinent negatives include no fever, no near-syncope, no palpitations, no syncope, no shortness of breath and no tingling.        Past Medical History   Diagnosis Date   ??? Factor V Leiden mutation (HCC)    ??? Infectious disease 02/2009      Hx MRSA / E.Coli bacteremia, MRSA wound infection 2/2 port   ??? Factor II deficiency (HCC)    ??? Calculus of kidney    ??? Depression    ??? Iron deficiency    ??? Migraines    ??? 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 Bipolar disorder (HCC)    ??? Anxiety    ??? Restless leg syndrome         Past Surgical History   Procedure Laterality Date   ??? Cystoscopy  ???     x 13   ??? Lithotripsy  2006   ??? Hx vascular access  July, 2010     [place]   ??? Hx vascular access Right 2013     pt has current rt [facility]cl[facility]   ??? Hx appendectomy  ???   ??? Hx tonsillectomy     ??? Hx cesarean section     ??? Hx tubal ligation      ??? Hx ovarian cyst removal       on R         Family History   Problem Relation Age of Onset   ??? Bleeding Prob Father      Factor V   ??? Cancer Maternal Grandmother      metastatic breast CA.   ??? Ovarian Cancer Mother [id number]     passed away due to ca   ??? Bipolar Disorder Mother    ??? Kidney Disease Brother      kidney stones        History     Social History   ??? Marital Status: DIVORCED     Spouse Name: N/A     Number of Children: N/A   ??? Years of Education: N/A     Occupational History   ??? Not on file.     Social History Main Topics   ??? Smoking status: Never Smoker    ??? Smokeless tobacco: Never Used   ??? Alcohol Use: Yes      Comment: rarely - only drinks 2 times per year   ??? Drug  Use: No      Comment: narcotic seeking per hospital hx.   ??? Sexual Activity:     Partners: Male     Birth Control/ Protection: Surgical     Other Topics Concern   ??? Not on file     Social History Narrative                  ALLERGIES: Ativan; Codeine; Demerol; Iodinated contrast media - iv dye; Ketorolac tromethamine; Motrin; Pcn; Pneumococcal vaccine; Pneumovax 23; and Shellfish containing products      Review of Systems   Constitutional: Positive for appetite change and fatigue. Negative for fever and chills.   HENT: Negative for congestion and facial swelling.    Eyes: Positive for pain. Negative for discharge and itching.   Respiratory: Negative for cough, choking and shortness of breath.    Cardiovascular: Negative for chest pain, palpitations, leg swelling, syncope and near-syncope.   Gastrointestinal: Positive for nausea, vomiting and anorexia. Negative for diarrhea.   Endocrine: Negative for cold intolerance and heat intolerance.   Genitourinary: Negative for flank pain and pelvic pain.   Musculoskeletal: Negative for back pain and neck pain.   Skin: Negative for rash and wound.   Allergic/Immunologic: Negative for immunocompromised state.   Neurological: Positive for dizziness and weakness. Negative for tingling and facial asymmetry.    Psychiatric/Behavioral: Negative for confusion and decreased concentration.       Filed Vitals:    08/11/13 1201   BP: 124/80   Pulse: 102   Temp: 98.9 ??F (37.2 ??C)   Resp: 16   Height: 5\' 4"  (1.626 m)   Weight: 64.592 kg (142 lb 6.4 oz)   SpO2: 98%            Physical Exam   Constitutional: She is oriented to person, place, and time. She appears well-developed and well-nourished. No distress.   HENT:   Head: Normocephalic and atraumatic.   Right Ear: External ear normal.   Left Ear: External ear normal.   Nose: Nose normal.   Eyes: Conjunctivae and EOM are normal. Pupils are equal, round, and reactive to light.   No nystagmus, eyes are squinted   Neck: Normal range of motion. Neck supple.   Cardiovascular: Normal rate, regular rhythm and normal heart sounds.    Pulmonary/Chest: Effort normal and breath sounds normal. No respiratory distress. She has no wheezes.   Abdominal: Soft. Bowel sounds are normal. She exhibits no distension. There is no tenderness.   Musculoskeletal: Normal range of motion. She exhibits no edema or tenderness.   Neurological: She is alert and oriented to person, place, and time. No cranial nerve deficit. Coordination normal.   Skin: Skin is warm and dry. No rash noted.   Psychiatric: She has a normal mood and affect. Her behavior is normal. Judgment and thought content normal.   Nursing note and vitals reviewed.       MDM  Number of Diagnoses or Management Options  Diagnosis management comments: 48 year old female with history of migraine headaches presents with 2 day history of the same.  Treated in the emergency department last night receive relief for some time however headache has returned again today.  She has right sided pain associated with photophobia and nausea and vomiting.  We'll provide IV fluids and IV morphine Benadryl and Phenergan and review she had a CT and lab work last night.  Will not repeat these at this time   1:48 PM  Reports she just received  her meds about 15 minutes ago, still with headache.  Wanted to know how much morphine she received.    2:38 PM  Just received second morphine injection.  Will re eval shortly  2:55 PM  Reports headache is sightly improved.  Will provide one more admin of meds ans dc home to rest at home    Risk of Complications, Morbidity, and/or Mortality  Presenting problems: minimal  Diagnostic procedures: low  Management options: low    Patient Progress  Patient progress: improved      Procedures

## 2013-08-11 NOTE — ED Notes (Addendum)
Patient's address verified. Medicaid Zenaida Niecevan called for patient's transport home.

## 2013-08-11 NOTE — ED Notes (Signed)
Patient's port flushed with normal saline 10ml and packed with Heparin 300units per implanted port protocol.

## 2013-08-11 NOTE — ED Notes (Signed)
Patient discharged to the lobby to await medicaid Krista Lopez transport.

## 2013-08-17 LAB — AMB POC URINALYSIS DIP STICK AUTO W/ MICRO (PGU)
Glucose (UA POC): NEGATIVE mg/dL
Ketones (UA POC): 15
Nitrites (UA POC): NEGATIVE
Protein (UA POC): 300
Specific gravity (UA POC): 1.025 (ref 1.001–1.035)
Urobilinogen (POC): 1
pH (UA POC): 5.5 (ref 4.6–8.0)

## 2013-08-17 MED ORDER — CIPROFLOXACIN 500 MG TAB
500 mg | ORAL_TABLET | Freq: Two times a day (BID) | ORAL | Status: AC
Start: 2013-08-17 — End: 2013-08-24

## 2013-08-17 NOTE — Progress Notes (Signed)
UA ck after UTI. Scheduled for cysto on Fri.  UA - Dipstick  Results for orders placed or performed in visit on 08/17/13   AMB POC URINALYSIS DIP STICK AUTO W/ MICRO (PGU)     Status: None   Result Value Ref Range Status    Glucose (UA POC) Negative Negative mg/dL Final    Bilirubin (UA POC) Large Negative Final    Ketones (UA POC) 15  Negative Final    Specific gravity (UA POC) 1.025 1.001 - 1.035 Final    Blood (UA POC) Large Negative Final    pH (UA POC) 5.5 4.6 - 8.0 Final    Protein (UA POC) 300  Negative Final    Urobilinogen (POC) 1 mg/dL  Final    Nitrites (UA POC) Negative Negative Final    Leukocyte esterase (UA POC) Large Negative Final       UA - Micro  WBC - TNTC  RBC - TNTC  Bacteria - 1+  Epith - 0  Gross hematuria noted today on cath specimen  Patient placed in supine position and the urethral meatus was cleansed with antiseptic.  4F non-indwelling catheter was placed without incident.  Urine returned from the catheter.  No bleeding or resistance noted.  Catheter was removed.  Patient tolerated the procedure well. sg      Requested Prescriptions     Signed Prescriptions Disp Refills   ??? ciprofloxacin HCl (CIPRO) 500 mg tablet 14 Tab 0     Sig: Take 1 Tab by mouth two (2) times a day for 7 days.     Plan    ICD-9-CM    1. Urinary tract infection, site not specified 599.0 INSERT,NON-INDWELLING BLADDER CATHETER     AMB POC URINALYSIS DIP STICK AUTO W/ MICRO (PGU)     URINE CULTURE,COMPREHENSIVE     ciprofloxacin HCl (CIPRO) 500 mg tablet   2. Gross hematuria 599.71      PLAN:  08/05/13 urine culture grew 2,000 Klebsiella and completed course of Bactrim.  Patient continues to have gross hematuria.  UA continues to appear infected today.   Sent urine culture  ERxd Cipro 500mg  po bid x 7 days  Move cysto appt on 08/19/13 out to next week after UTI treatment        Lucky CowboyShannon Gibson, [facility] Center[facility]NP-BC

## 2013-08-19 LAB — URINE CULTURE,COMPREHENSIVE: Urine Culture,Comprehensive: NO GROWTH

## 2013-08-22 NOTE — Telephone Encounter (Signed)
-----   Message from Josie Saunders, NP sent at 08/22/2013  9:21 AM EDT -----  Please notify patient of negative urine culture results.  Recommend she f/u for cysto as scheduled to further evaluate gross hematuria in the absence of UTI

## 2013-08-22 NOTE — Telephone Encounter (Signed)
Message to patient regarding urine culture results. Krista Atayde A Maayan Jenning, LPN

## 2013-08-30 LAB — AMB POC URINALYSIS DIP STICK AUTO W/ MICRO (PGU)
Glucose (UA POC): NEGATIVE mg/dL
Leukocyte esterase (UA POC): NEGATIVE
Nitrites (UA POC): NEGATIVE
Protein (UA POC): 300
Specific gravity (UA POC): 1.025 (ref 1.001–1.035)
Urobilinogen (POC): 1
pH (UA POC): 5.5 (ref 4.6–8.0)

## 2013-08-30 MED ORDER — METHENAM 118 MG-M.BLUE 10 MG-S.PHOS 40.8 MG-P.SALIC 36 MG-HYOS CAPSULE
ORAL_CAPSULE | Freq: Four times a day (QID) | ORAL | Status: DC | PRN
Start: 2013-08-30 — End: 2014-01-30

## 2013-08-30 MED ORDER — NITROFURANTOIN (25% MACROCRYSTAL FORM) 100 MG CAP
100 mg | ORAL_CAPSULE | Freq: Every day | ORAL | Status: DC
Start: 2013-08-30 — End: 2014-01-30

## 2013-08-30 NOTE — Procedures (Signed)
[facility]  [address]  [place], [place] [id number]  [phone]    Krista AgeeShannon Lopez  DOB: 1965/04/25  evaluation of gross hematuria referred by Dr Krista Lopez. 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 Patient started having gross hematuria on 07/17/13 and the gross hematuria has been consistent since that time. She denies any associated back pain however admits to Krista Lopez. She no longer has periods.   She denies any associated N/V/F/C. Denies any frequency & urgency.  07/21/13 CT urogram showed a 7 mm non-obstructing Right LP renal stone. No ureteral stones or obstruction noted. This renal stone is NOT a new finding.     Patient has been seen in this practice in the past by several of our physicians. Last seen in 2011 for kidney stones.     nal result (07/21/2013 5:10 PM) Open       Study Result    CT abdomen and pelvis  INDICATION: Right flank pain and hematuria  Multiple axial images were obtained through the abdomen and pelvis without  intravenous or oral contrast.  Abdomen CT: There is a 7 mm nonobstructing stone in the lower pole of the right  kidney. No left-sided stones are seen. There is no hydronephrosis. There is  no definite renal mass.  The lung bases are clear. No discrete lesions are seen in the visualized  portions of the liver or spleen. There are no adrenal or pancreas masses.   There are no inflammatory changes or fluid collections in the abdomen. IVC  filter is in place.  Pelvis CT: There are no distal ureteral or bladder calculi. The appendix is not  identified. There are no inflammatory changes or fluid collections in the  pelvis. There is no significant adenopathy. There are no bony lesions.  IMPRESSION: Small nonobstructing right kidney stone.            HPI   48 y.o., female presents for cystoscopy       Past Medical History   Diagnosis Date   ??? Factor V Leiden mutation (HCC)    ??? Infectious disease 02/2009       Hx MRSA / E.Coli bacteremia, MRSA wound infection 2/2 port   ??? Factor II deficiency (HCC)    ??? Calculus of kidney    ??? Depression    ??? Iron deficiency    ??? Migraines    ??? 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 Bipolar disorder (HCC)    ??? Anxiety    ??? Restless leg syndrome    ??? Hematuria, microscopic      Past Surgical History   Procedure Laterality Date   ??? Cystoscopy  ???     x 13   ??? Lithotripsy  2006   ??? Hx vascular access  July, 2010     [place]   ??? Hx vascular access Right 2013     pt has current rt [facility]   ??? Hx appendectomy  ???   ??? Hx tonsillectomy     ??? Hx cesarean section     ??? Hx tubal ligation     ??? Hx ovarian cyst removal       on R     Current Outpatient Prescriptions   Medication Sig Dispense Refill   ??? nitrofurantoin, macrocrystal-monohydrate, (MACROBID) 100 mg capsule  Take 1 Cap by mouth daily. 30 Cap 1   ??? butalbital-acetaminophen-caffeine (FIORICET, ESGIC) 50-325-40 mg per tablet   0   ??? SUMAtriptan (IMITREX) 100 mg tablet Take 1 Tab by mouth once as needed for Migraine. 20 Tab 0   ??? rivaroxaban (XARELTO) 20 mg tab tablet Take  by mouth daily.     ??? DULoxetine (CYMBALTA) 60 mg capsule Take 3 caps every day.  Indications: ANXIETY WITH DEPRESSION (Patient taking differently: 60 mg two (2) times a day. Take 3 caps every day.  Indications: ANXIETY WITH DEPRESSION) 90 Cap 0   ??? QUEtiapine (SEROQUEL) 300 mg tablet Take 1 Tab by mouth nightly. (Patient taking differently: Take 400 mg by mouth nightly.) 30 Tab 0   ??? pantoprazole (PROTONIX) 40 mg tablet Take 1 Tab by mouth Before breakfast and dinner. 30 Tab 0   ??? ondansetron (ZOFRAN ODT) 4 mg disintegrating tablet Take 1 Tab by mouth every eight (8) hours as needed for Nausea. 30 Tab 0   ??? clonazePAM (KLONOPIN) 1 mg tablet Take 1 mg by mouth three (3) times daily.       Allergies   Allergen Reactions    ??? Ativan [Lorazepam] Itching   ??? Codeine Rash   ??? Demerol [Meperidine] Itching     *     ??? Iodinated Contrast Media - Iv Dye Rash     States also has swelling, and allergic to shellfish   ??? Ketorolac Tromethamine Rash   ??? Motrin [Ibuprofen] Other (comments)     Causes stomach upset after 3 doses.   ??? Pcn [Penicillins] Hives   ??? Pneumococcal Vaccine Swelling   ??? Pneumovax 23 [Pneumococcal 23-Val Ps Vaccine] Other (comments)     Local reaction   ??? Shellfish Containing Products Rash     History     Social History   ??? Marital Status: DIVORCED     Spouse Name: N/A     Number of Children: N/A   ??? Years of Education: N/A     Occupational History   ??? Not on file.     Social History Main Topics   ??? Smoking status: Never Smoker    ??? Smokeless tobacco: Never Used   ??? Alcohol Use: Yes      Comment: rarely - only drinks 2 times per year   ??? Drug Use: No      Comment: narcotic seeking per hospital hx.   ??? Sexual Activity:     Partners: Male     Birth Control/ Protection: Surgical     Other Topics Concern   ??? Not on file     Social History Narrative     Family History   Problem Relation Age of Onset   ??? Bleeding Prob Father      Factor V   ??? Cancer Maternal Grandmother      metastatic breast CA.   ??? Ovarian Cancer Mother [id number]     passed away due to ca   ??? Bipolar Disorder Mother    ??? Kidney Disease Brother      kidney stones       BP 135/90 mmHg   Pulse 95   Temp(Src) 97.6 ??F (36.4 ??C) (Tympanic)   Ht 5' 4.5" (1.638 m)   Wt 141 lb (63.957 kg)   BMI 23.84 kg/m2   LMP 07/28/2004    UA - Dipstick  Results for orders placed or performed in visit on 08/30/13   AMB POC URINALYSIS DIP STICK  AUTO W/ MICRO (PGU)     Status: None   Result Value Ref Range Status    Glucose (UA POC) Negative Negative mg/dL Final    Bilirubin (UA POC) Small Negative Final    Ketones (UA POC) Trace Negative Final    Specific gravity (UA POC) 1.025 1.001 - 1.035 Final    Blood (UA POC) Large Negative Final    pH (UA POC) 5.5 4.6 - 8.0 Final     Protein (UA POC) 300  Negative Final    Urobilinogen (POC) 1 mg/dL  Final    Nitrites (UA POC) Negative Negative Final    Leukocyte esterase (UA POC) Negative Negative Final       UA - Micro  WBC - 0  RBC - 0  Bacteria - 0  Epith - 0      Cystoscopy Procedure:    All risks, benefits and alternatives were again reviewed with patient and she is willing to proceed at this time.  Her genital area was prepped and draped and a sterile field applied. 2% lidocaine jelly was injected in the the urethra and allowed to dwell for several minutes.  A flexible cystoscope was then inserted into the urethral meatus and advanced under direct vision.  The urethra appeared normal with no obstructions.  The bladder neck was open without scarring, contractures, frons or tumors present. The bladder was systematically surveyed.  No bladder trabeculations were seen.  The patient had some small areas of irritation throughout the bladder that appeared to be consistent with cystitis cystica.  The ureteral orifices were seen in their normal orthotopic position.  The cystoscope was then removed under direct vision.  The patient tolerated the procedure well.    Assessment and Plan    ICD-9-CM    1. Gross hematuria 599.71 AMB POC URINALYSIS DIP STICK AUTO W/ MICRO (PGU)     CYSTOURETHROSCOPY   2. Renal stone 592.0     Right-sided renal stone found on CT scan.   3. 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 Factor II deficiency (HCC) 286.3    5. 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  Patient with gross hematuria.  Orders Placed This Encounter   ??? CYSTOURETHROSCOPY   ??? AMB POC URINALYSIS DIP STICK AUTO W/ MICRO (PGU)   ??? nitrofurantoin, macrocrystal-monohydrate, (MACROBID) 100 mg capsule     Sig: Take 1 Cap by mouth daily.     Dispense:  30 Cap     Refill:  1     Patient's had some vague right flank pain just recently.  She's had no history of stones in the past and has a 7 mm right lower pole  nonobstructing stone.  This could be causing the blood in the urine.  There was some irritation in the bladder and it appears like cystitis cystica.  And I think I would like to treat this with a prolonged course of antibiotics to see if this would help prevent the hematuria by clearing up the bladder irritation.  Patient's had a history of cystitis.  Orders Placed This Encounter   ??? CYSTOURETHROSCOPY   ??? AMB POC URINALYSIS DIP STICK AUTO W/ MICRO (PGU)   ??? nitrofurantoin, macrocrystal-monohydrate, (MACROBID) 100 mg capsule     Sig: Take 1 Cap by mouth daily.     Dispense:  30 Cap     Refill:  1     Follow-up Disposition:  Return in about 6 weeks (around  10/11/2013) for to check KUB, Follow symptoms.Marland Kitchen

## 2013-08-30 NOTE — Procedures (Signed)
[facility]  [address]  [place], [place] [id number]  [phone]    Krista AgeeShannon Lopez  DOB: [birth year]  evaluation of gross hematuria referred by Dr Anola GurneyWalls. 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 Patient started having gross hematuria on 07/17/13 and the gross hematuria has been consistent since that time. She denies any associated back pain however admits to pelvic pressure. She no longer has periods.   She denies any associated N/V/F/C. Denies any frequency & urgency.  07/21/13 CT urogram showed a 7 mm non-obstructing Right LP renal stone. No ureteral stones or obstruction noted. This renal stone is NOT a new finding.     Patient has been seen in this practice in the past by several of our physicians. Last seen in 2011 for kidney stones.     nal result (07/21/2013 5:10 PM) Open       Study Result    CT abdomen and pelvis  INDICATION: Right flank pain and hematuria  Multiple axial images were obtained through the abdomen and pelvis without  intravenous or oral contrast.  Abdomen CT: There is a 7 mm nonobstructing stone in the lower pole of the right  kidney. No left-sided stones are seen. There is no hydronephrosis. There is  no definite renal mass.  The lung bases are clear. No discrete lesions are seen in the visualized  portions of the liver or spleen. There are no adrenal or pancreas masses.   There are no inflammatory changes or fluid collections in the abdomen. IVC  filter is in place.  Pelvis CT: There are no distal ureteral or bladder calculi. The appendix is not  identified. There are no inflammatory changes or fluid collections in the  pelvis. There is no significant adenopathy. There are no bony lesions.  IMPRESSION: Small nonobstructing right kidney stone.            HPI   48 y.o., female presents for cystoscopy       Past Medical History   Diagnosis Date   ??? Factor V Leiden mutation (HCC)    ??? Infectious disease 02/2009      Hx MRSA / E.Coli bacteremia,  MRSA wound infection 2/2 port   ??? Factor II deficiency (HCC)    ??? Calculus of kidney    ??? Depression    ??? Iron deficiency    ??? Migraines    ??? 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 Bipolar disorder (HCC)    ??? Anxiety    ??? Restless leg syndrome    ??? Hematuria, microscopic      Past Surgical History   Procedure Laterality Date   ??? Cystoscopy  ???     x 13   ??? Lithotripsy  2006   ??? Hx vascular access  July, 2010     [place]   ??? Hx vascular access Right 2013     pt has current [facility]   ??? Hx appendectomy  ???   ??? Hx tonsillectomy     ??? Hx cesarean section     ??? Hx tubal ligation     ??? Hx ovarian cyst removal       on R     Current Outpatient Prescriptions   Medication Sig Dispense Refill   ??? nitrofurantoin, macrocrystal-monohydrate, (MACROBID) 100 mg capsule  Take 1 Cap by mouth daily. 30 Cap 1   ??? butalbital-acetaminophen-caffeine (FIORICET, ESGIC) 50-325-40 mg per tablet   0   ??? SUMAtriptan (IMITREX) 100 mg tablet Take 1 Tab by mouth once as needed for Migraine. 20 Tab 0   ??? rivaroxaban (XARELTO) 20 mg tab tablet Take  by mouth daily.     ??? DULoxetine (CYMBALTA) 60 mg capsule Take 3 caps every day.  Indications: ANXIETY WITH DEPRESSION (Patient taking differently: 60 mg two (2) times a day. Take 3 caps every day.  Indications: ANXIETY WITH DEPRESSION) 90 Cap 0   ??? QUEtiapine (SEROQUEL) 300 mg tablet Take 1 Tab by mouth nightly. (Patient taking differently: Take 400 mg by mouth nightly.) 30 Tab 0   ??? pantoprazole (PROTONIX) 40 mg tablet Take 1 Tab by mouth Before breakfast and dinner. 30 Tab 0   ??? ondansetron (ZOFRAN ODT) 4 mg disintegrating tablet Take 1 Tab by mouth every eight (8) hours as needed for Nausea. 30 Tab 0   ??? clonazePAM (KLONOPIN) 1 mg tablet Take 1 mg by mouth three (3) times daily.       Allergies   Allergen Reactions   ??? Ativan [Lorazepam] Itching   ??? Codeine Rash   ???  Demerol [Meperidine] Itching     *     ??? Iodinated Contrast Media - Iv Dye Rash     States also has swelling, and allergic to shellfish   ??? Ketorolac Tromethamine Rash   ??? Motrin [Ibuprofen] Other (comments)     Causes stomach upset after 3 doses.   ??? Pcn [Penicillins] Hives   ??? Pneumococcal Vaccine Swelling   ??? Pneumovax 23 [Pneumococcal 23-Val Ps Vaccine] Other (comments)     Local reaction   ??? Shellfish Containing Products Rash     History     Social History   ??? Marital Status: DIVORCED     Spouse Name: N/A     Number of Children: N/A   ??? Years of Education: N/A     Occupational History   ??? Not on file.     Social History Main Topics   ??? Smoking status: Never Smoker    ??? Smokeless tobacco: Never Used   ??? Alcohol Use: Yes      Comment: rarely - only drinks 2 times per year   ??? Drug Use: No      Comment: narcotic seeking per hospital hx.   ??? Sexual Activity:     Partners: Male     Birth Control/ Protection: Surgical     Other Topics Concern   ??? Not on file     Social History Narrative     Family History   Problem Relation Age of Onset   ??? Bleeding Prob Father      Factor V   ??? Cancer Maternal Grandmother      metastatic breast CA.   ??? Ovarian Cancer Mother 32     passed away due to ca   ??? Bipolar Disorder Mother    ??? Kidney Disease Brother      kidney stones       BP 135/90 mmHg   Pulse 95   Temp(Src) 97.6 ??F (36.4 ??C) (Tympanic)   Ht 5' 4.5" (1.638 m)   Wt 141 lb (63.957 kg)   BMI 23.84 kg/m2   LMP 07/28/2004    UA - Dipstick  Results for orders placed or performed in visit on 08/30/13   AMB POC URINALYSIS DIP STICK  AUTO W/ MICRO (PGU)     Status: None   Result Value Ref Range Status    Glucose (UA POC) Negative Negative mg/dL Final    Bilirubin (UA POC) Small Negative Final    Ketones (UA POC) Trace Negative Final    Specific gravity (UA POC) 1.025 1.001 - 1.035 Final    Blood (UA POC) Large Negative Final    pH (UA POC) 5.5 4.6 - 8.0 Final    Protein (UA POC) 300  Negative Final    Urobilinogen (POC) 1 mg/dL   Final    Nitrites (UA POC) Negative Negative Final    Leukocyte esterase (UA POC) Negative Negative Final       UA - Micro  WBC - 0  RBC - 0  Bacteria - 0  Epith - 0      Cystoscopy Procedure:    All risks, benefits and alternatives were again reviewed with patient and she is willing to proceed at this time.  Her genital area was prepped and draped and a sterile field applied. 2% lidocaine jelly was injected in the the urethra and allowed to dwell for several minutes.  A flexible cystoscope was then inserted into the urethral meatus and advanced under direct vision.  The urethra appeared normal with no obstructions.  The bladder neck was open without scarring, contractures, frons or tumors present. The bladder was systematically surveyed.  No bladder trabeculations were seen.  The patient had some small areas of irritation throughout the bladder that appeared to be consistent with cystitis cystica.  The ureteral orifices were seen in their normal orthotopic position.  The cystoscope was then removed under direct vision.  The patient tolerated the procedure well.    Assessment and Plan    ICD-9-CM    1. Gross hematuria 599.71 AMB POC URINALYSIS DIP STICK AUTO W/ MICRO (PGU)     CYSTOURETHROSCOPY   2. Renal stone 592.0     Right-sided renal stone found on CT scan.   3. 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 Factor II deficiency (HCC) 286.3    5. 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  Patient with gross hematuria.  Orders Placed This Encounter   ??? CYSTOURETHROSCOPY   ??? AMB POC URINALYSIS DIP STICK AUTO W/ MICRO (PGU)   ??? nitrofurantoin, macrocrystal-monohydrate, (MACROBID) 100 mg capsule     Sig: Take 1 Cap by mouth daily.     Dispense:  30 Cap     Refill:  1     Patient's had some vague right flank pain just recently.  She's had no history of stones in the past and has a 7 mm right lower pole nonobstructing stone.  This could be causing the blood in the urine.  There was some irritation in the  bladder and it appears like cystitis cystica.  And I think I would like to treat this with a prolonged course of antibiotics to see if this would help prevent the hematuria by clearing up the bladder irritation.  Patient's had a history of cystitis.  Orders Placed This Encounter   ??? CYSTOURETHROSCOPY   ??? AMB POC URINALYSIS DIP STICK AUTO W/ MICRO (PGU)   ??? nitrofurantoin, macrocrystal-monohydrate, (MACROBID) 100 mg capsule     Sig: Take 1 Cap by mouth daily.     Dispense:  30 Cap     Refill:  1     Follow-up Disposition:  Return in about 6 weeks (around  10/11/2013) for to check KUB, Follow symptoms.Marland Kitchen

## 2013-08-30 NOTE — Telephone Encounter (Signed)
Cysto today and dysuria and uribel sent to pharmacy    Orders Placed This Encounter   ??? Mth-Me Blue-Sod Phos-PhSal-Hyo (URIBEL) 118-10-40.8-36 mg cap     Sig: Take 1 Cap by mouth four (4) times daily as needed.     Dispense:  15 Cap     Refill:  3

## 2013-08-30 NOTE — Progress Notes (Signed)
See procedure note/dh

## 2013-08-31 MED ORDER — PROMETHAZINE 25 MG/ML INJECTION
25 mg/mL | INTRAMUSCULAR | Status: AC
Start: 2013-08-31 — End: 2013-08-31
  Administered 2013-08-31: 15:00:00 via INTRAVENOUS

## 2013-08-31 MED ORDER — BUTALBITAL-ACETAMINOPHEN-CAFFEINE 50 MG-325 MG-40 MG TAB
50-325-40 mg | ORAL_TABLET | ORAL | Status: DC | PRN
Start: 2013-08-31 — End: 2014-11-17

## 2013-08-31 MED ORDER — BUTORPHANOL TARTRATE 2 MG/ML IJ SOLN
2 mg/mL | INTRAMUSCULAR | Status: AC
Start: 2013-08-31 — End: 2013-08-31
  Administered 2013-08-31: 16:00:00 via INTRAVENOUS

## 2013-08-31 MED ORDER — DIPHENHYDRAMINE HCL 50 MG/ML IJ SOLN
50 mg/mL | INTRAMUSCULAR | Status: AC
Start: 2013-08-31 — End: 2013-08-31
  Administered 2013-08-31: 16:00:00 via INTRAVENOUS

## 2013-08-31 MED FILL — BUTORPHANOL TARTRATE 2 MG/ML IJ SOLN: 2 mg/mL | INTRAMUSCULAR | Qty: 1

## 2013-08-31 MED FILL — DIPHENHYDRAMINE HCL 50 MG/ML IJ SOLN: 50 mg/mL | INTRAMUSCULAR | Qty: 1

## 2013-08-31 MED FILL — PROMETHAZINE 25 MG/ML INJECTION: 25 mg/mL | INTRAMUSCULAR | Qty: 1

## 2013-08-31 NOTE — ED Provider Notes (Signed)
HPI Comments: Patient has history of recurrent migraines.  She has been seen twice this month for the same.  She states Imitrex is not helping.  Pain has been severe since last night.  She reports vomiting multiple times.  She denies any change from prior migraines.  Pain is been gradual on onset.  No head injury, fever, sinus congestion, focal numbness or weakness.  She has not been evaluated by neurology and would like a referral.    Patient is a 48 y.o. female presenting with headaches. [name]   Headache   This is a recurrent problem. The current episode started yesterday. The problem occurs constantly. The problem has not changed since onset.The headache is aggravated by nausea, vomiting and bright light. The pain is located in the left unilateral region. The quality of the pain is described as throbbing. The pain is severe. Associated symptoms include malaise/fatigue, nausea and vomiting. Pertinent negatives include no fever, no near-syncope, no palpitations, no syncope, no shortness of breath, no weakness, no tingling and no visual change. She has tried triptan therapy for the symptoms. The treatment provided no relief.        Past Medical History   Diagnosis Date   ??? Factor V Leiden mutation (HCC)    ??? Infectious disease 02/2009      Hx MRSA / E.Coli bacteremia, MRSA wound infection 2/2 port   ??? Factor II deficiency (HCC)    ??? Calculus of kidney    ??? Depression    ??? Iron deficiency    ??? Migraines    ??? 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 Bipolar disorder (HCC)    ??? Anxiety    ??? Restless leg syndrome    ??? Hematuria, microscopic         Past Surgical History   Procedure Laterality Date   ??? Cystoscopy  ???     x 13   ??? Lithotripsy  2006   ??? Hx vascular access  July, 2010     Power Port placed in [place], [place] removed   ??? Hx vascular access Right 2013     pt has current rt subclavian port - placed at [facility]    ??? Hx appendectomy  ???   ??? Hx tonsillectomy     ??? Hx cesarean section     ??? Hx tubal ligation     ??? Hx ovarian cyst removal       on R         Family History   Problem Relation Age of Onset   ??? Bleeding Prob Father      Factor V   ??? Cancer Maternal Grandmother      metastatic breast CA.   ??? Ovarian Cancer Mother 52     passed away due to ca   ??? Bipolar Disorder Mother    ??? Kidney Disease Brother      kidney stones        History     Social History   ??? Marital Status: DIVORCED     Spouse Name: N/A     Number of Children: N/A   ??? Years of Education: N/A     Occupational History   ??? Not on file.     Social History Main Topics   ??? Smoking status: Never Smoker    ??? Smokeless tobacco: Never Used   ??? Alcohol  Use: Yes      Comment: rarely - only drinks 2 times per year   ??? Drug Use: No      Comment: narcotic seeking per hospital hx.   ??? Sexual Activity:     Partners: Male     Birth Control/ Protection: Surgical     Other Topics Concern   ??? Not on file     Social History Narrative                  ALLERGIES: Ativan; Codeine; Demerol; Iodinated contrast media - iv dye; Ketorolac tromethamine; Motrin; Pcn; Pneumococcal vaccine; Pneumovax 23; and Shellfish containing products      Review of Systems   Constitutional: Positive for malaise/fatigue. Negative for fever and chills.   HENT: Negative for hearing loss.    Eyes: Positive for photophobia. Negative for visual disturbance.   Respiratory: Negative for cough and shortness of breath.    Cardiovascular: Negative for chest pain, palpitations, syncope and near-syncope.   Gastrointestinal: Positive for nausea and vomiting. Negative for abdominal pain and diarrhea.   Musculoskeletal: Negative for back pain.   Skin: Negative for rash.   Neurological: Positive for headaches. Negative for tingling, syncope and weakness.   Psychiatric/Behavioral: Negative for confusion.       Filed Vitals:    08/31/13 1014   BP: 132/75   Pulse: 89   Temp: 98.9 ??F (37.2 ??C)   Resp: 20    Height: 5\' 4"  (1.626 m)   Weight: 64.048 kg (141 lb 3.2 oz)   SpO2: 95%            Physical Exam   Constitutional: She appears well-developed and well-nourished. No distress.   HENT:   Head: Normocephalic and atraumatic.   Right Ear: External ear normal.   Left Ear: External ear normal.   Nose: Nose normal. Right sinus exhibits no maxillary sinus tenderness and no frontal sinus tenderness. Left sinus exhibits no maxillary sinus tenderness and no frontal sinus tenderness.   Mouth/Throat: Oropharynx is clear and moist and mucous membranes are normal. No oropharyngeal exudate or posterior oropharyngeal erythema.   Eyes: Conjunctivae are normal. Pupils are equal, round, and reactive to light.   Neck: Normal range of motion. Neck supple.   Cardiovascular: Regular rhythm, normal heart sounds and intact distal pulses.    Pulmonary/Chest: Effort normal and breath sounds normal. No respiratory distress. She has no wheezes.   Abdominal: Soft. Bowel sounds are normal. She exhibits no distension. There is no tenderness.   Musculoskeletal: Normal range of motion. She exhibits no edema.   Neurological: She is alert. She has normal strength. No cranial nerve deficit or sensory deficit. Gait normal.   Skin: Skin is warm and dry.   Psychiatric: Judgment normal.   Nursing note and vitals reviewed.       MDM  Number of Diagnoses or Management Options     Amount and/or Complexity of Data Reviewed  Tests in the medicine section of CPT??: ordered and reviewed  Review and summarize past medical records: yes    Risk of Complications, Morbidity, and/or Mortality  Presenting problems: moderate  Diagnostic procedures: low  Management options: moderate    Patient Progress  Patient progress: stable      Procedures

## 2013-08-31 NOTE — ED Notes (Signed)
I have reviewed discharge instructions with the patient.  The patient verbalized understanding.

## 2013-08-31 NOTE — ED Notes (Signed)
Pt complains of a migraine onset 24 hours states it is per her typical

## 2013-08-31 NOTE — ED Notes (Signed)
C/o headache onset last pm with vomiting.

## 2013-09-04 LAB — D-DIMER, QUANTITATIVE: D-Dimer, Quant: <0.19 ug/ml(FEU) (ref <0.55)

## 2013-09-04 LAB — CBC WITH AUTOMATED DIFF
ABS. BASOPHILS: 0 10*3/uL (ref 0.0–0.2)
ABS. EOSINOPHILS: 0.5 10*3/uL (ref 0.0–0.8)
ABS. IMM. GRANS.: 0 10*3/uL (ref 0.0–0.5)
ABS. LYMPHOCYTES: 1.4 10*3/uL (ref 0.5–4.6)
ABS. MONOCYTES: 0.7 10*3/uL (ref 0.1–1.3)
ABS. NEUTROPHILS: 2.5 10*3/uL (ref 1.7–8.2)
BASOPHILS: 1 % (ref 0.0–2.0)
EOSINOPHILS: 10 % — ABNORMAL HIGH (ref 0.5–7.8)
HCT: 33.1 % — ABNORMAL LOW (ref 35.8–46.3)
HGB: 10.7 g/dL — ABNORMAL LOW (ref 11.7–15.4)
IMMATURE GRANULOCYTES: 0.2 % (ref 0.0–5.0)
LYMPHOCYTES: 28 % (ref 13–44)
MCH: 30.5 PG (ref 26.1–32.9)
MCHC: 32.3 g/dL (ref 31.4–35.0)
MCV: 94.3 FL (ref 79.6–97.8)
MONOCYTES: 13 % — ABNORMAL HIGH (ref 4.0–12.0)
MPV: 10.6 FL — ABNORMAL LOW (ref 10.8–14.1)
NEUTROPHILS: 48 % (ref 43–78)
PLATELET: 238 10*3/uL (ref 150–450)
RBC: 3.51 M/uL — ABNORMAL LOW (ref 4.05–5.25)
RDW: 14.1 % (ref 11.9–14.6)
WBC: 5.2 10*3/uL (ref 4.3–11.1)

## 2013-09-04 LAB — METABOLIC PANEL, COMPREHENSIVE
A-G Ratio: 1.2 (ref 1.2–3.5)
ALT (SGPT): 20 U/L (ref 12–65)
AST (SGOT): 16 U/L (ref 15–37)
Albumin: 3.7 g/dL (ref 3.5–5.0)
Alk. phosphatase: 112 U/L (ref 50–136)
Anion gap: 7 mmol/L (ref 7–16)
BUN: 15 MG/DL (ref 6–23)
Bilirubin, total: 0.2 MG/DL (ref 0.2–1.1)
CO2: 28 mmol/L (ref 21–32)
Calcium: 9.3 MG/DL (ref 8.3–10.4)
Chloride: 106 mmol/L (ref 98–107)
Creatinine: 0.9 MG/DL (ref 0.6–1.0)
GFR est AA: >60 mL/min/{1.73_m2} (ref >60)
GFR est non-AA: >60 mL/min/{1.73_m2} (ref >60)
Globulin: 3.1 g/dL (ref 2.3–3.5)
Glucose: 102 mg/dL — ABNORMAL HIGH (ref 65–100)
Potassium: 4.8 mmol/L (ref 3.5–5.1)
Protein, total: 6.8 g/dL (ref 6.3–8.2)
Sodium: 141 mmol/L (ref 136–145)

## 2013-09-04 LAB — D DIMER: D DIMER: <0.19 ug/ml(FEU) (ref <0.55)

## 2013-09-04 MED ORDER — HEPARIN, PORCINE (PF) 100 UNIT/ML IV SYRINGE
100 unit/mL | INTRAVENOUS | Status: DC | PRN
Start: 2013-09-04 — End: 2013-09-04
  Administered 2013-09-04: 22:00:00

## 2013-09-04 MED ORDER — SODIUM CHLORIDE 0.9% BOLUS IV
0.9 % | Freq: Once | INTRAVENOUS | Status: AC
Start: 2013-09-04 — End: 2013-09-04
  Administered 2013-09-04: 18:00:00 via INTRAVENOUS

## 2013-09-04 MED ORDER — ONDANSETRON (PF) 4 MG/2 ML INJECTION
4 mg/2 mL | INTRAMUSCULAR | Status: AC
Start: 2013-09-04 — End: 2013-09-04
  Administered 2013-09-04: 18:00:00 via INTRAVENOUS

## 2013-09-04 MED ORDER — ENOXAPARIN 100 MG/ML SUB-Q SYRINGE
100 mg/mL | SUBCUTANEOUS | Status: AC
Start: 2013-09-04 — End: 2013-09-04
  Administered 2013-09-04: 22:00:00 via SUBCUTANEOUS

## 2013-09-04 MED ORDER — MORPHINE 4 MG/ML SYRINGE
4 mg/mL | INTRAMUSCULAR | Status: AC
Start: 2013-09-04 — End: 2013-09-04
  Administered 2013-09-04: 19:00:00 via INTRAVENOUS

## 2013-09-04 MED ORDER — DIPHENHYDRAMINE 25 MG CAP
25 mg | ORAL | Status: DC
Start: 2013-09-04 — End: 2013-09-04

## 2013-09-04 MED ORDER — METHYLPREDNISOLONE (PF) 125 MG/2 ML IJ SOLR
125 mg/2 mL | Freq: Once | INTRAMUSCULAR | Status: AC
Start: 2013-09-04 — End: 2013-09-04
  Administered 2013-09-04: 20:00:00 via INTRAVENOUS

## 2013-09-04 MED ORDER — FAMOTIDINE 20 MG TAB
20 mg | ORAL | Status: AC
Start: 2013-09-04 — End: 2013-09-04
  Administered 2013-09-04: 20:00:00 via ORAL

## 2013-09-04 MED ORDER — DIPHENHYDRAMINE HCL 50 MG/ML IJ SOLN
50 mg/mL | INTRAMUSCULAR | Status: AC
Start: 2013-09-04 — End: 2013-09-04
  Administered 2013-09-04: 20:00:00 via INTRAVENOUS

## 2013-09-04 MED FILL — LOVENOX 100 MG/ML SUBCUTANEOUS SYRINGE: 100 mg/mL | SUBCUTANEOUS | Qty: 1

## 2013-09-04 MED FILL — DIPHENHYDRAMINE HCL 50 MG/ML IJ SOLN: 50 mg/mL | INTRAMUSCULAR | Qty: 1

## 2013-09-04 MED FILL — SOLU-MEDROL (PF) 125 MG/2 ML SOLUTION FOR INJECTION: 125 mg/2 mL | INTRAMUSCULAR | Qty: 2

## 2013-09-04 MED FILL — MORPHINE 4 MG/ML SYRINGE: 4 mg/mL | INTRAMUSCULAR | Qty: 1

## 2013-09-04 MED FILL — ONDANSETRON (PF) 4 MG/2 ML INJECTION: 4 mg/2 mL | INTRAMUSCULAR | Qty: 2

## 2013-09-04 MED FILL — MONOJECT PREFILL ADVANCED (PF) 100 UNIT/ML INTRAVENOUS SYRINGE: 100 unit/mL | INTRAVENOUS | Qty: 3

## 2013-09-04 MED FILL — FAMOTIDINE 20 MG TAB: 20 mg | ORAL | Qty: 1

## 2013-09-04 NOTE — ED Notes (Signed)
I have reviewed medications, follow up provider options, and discharge instructions with the patient. The patient verbalized understanding. Copy of discharge information given to patient upon discharge. Prescription(s) given to patient. Patient discharged in no distress.

## 2013-09-04 NOTE — ED Notes (Signed)
Patient c/o chest pain since 0800. Patient no distress, 100% on RA.

## 2013-09-04 NOTE — ED Notes (Signed)
Port packed and discontinued

## 2013-09-04 NOTE — ED Provider Notes (Signed)
HPI Comments: Patient is here with some chest pain that she developed after eating breakfast this morning she describes as sharp and somewhat worse to deep inspiration.  He felt somewhat lightheaded as well.  She has a history of embolic events and has been on blood thinners long-term but is been off of them since July 17.  She denies any fever or infectious symptoms.  She is on antibiotics at present for a urinary tract infection having last Tuesday had a cystoscopy done by Dr. Benjamine Mola.  Before this she had had recurring hematuria and that was the reason for the diagnostic study.  She is scheduled to see Dr. Sherrell Puller on this Tuesday at 1:15.  Hematuria has now cleared.  She has no fever or infectious sputum.  She does have known recurring pulmonary emboli.  We have discussed all the above at length with Dr. Charlette Caffey who knows her quite well and is taking care of her for a long period of time.  He feels is at this point she needs to return to her Arixtra patient is unwilling to do so.  Thus we will bridge her with Lovenox she actually has Lovenox dosing that is current at home    Patient is a 48 y.o. female presenting with chest pain. [name]   Chest Pain (Angina)   This is a new problem. The current episode started 3 to 5 hours ago. The problem has been gradually improving. The pain is associated with normal activity. The quality of the pain is described as sharp. Pertinent negatives include no cough, no diaphoresis, no exertional chest pressure, no fever, no hemoptysis, no irregular heartbeat, no near-syncope, no palpitations, no shortness of breath and no sputum production. She has tried nothing for the symptoms. Her past medical history does not include DVT.        Past Medical History   Diagnosis Date   ??? Factor V Leiden mutation ([place])    ??? Infectious disease 02/2009      Hx MRSA / E.Coli bacteremia, MRSA wound infection 2/2 port   ??? Factor II deficiency ([place])     ??? Calculus of kidney    ??? Depression    ??? Iron deficiency    ??? Migraines    ??? 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 Bipolar disorder ([place])    ??? Anxiety    ??? Restless leg syndrome    ??? Hematuria, microscopic         Past Surgical History   Procedure Laterality Date   ??? Cystoscopy  ???     x 13   ??? Lithotripsy  2006   ??? Hx vascular access  July, 2010     Power Port placed in [place], [place]., removed   ??? Hx vascular access Right 2013     pt has current rt subclavian port - placed at [facility]   ??? Hx appendectomy  ???   ??? Hx tonsillectomy     ??? Hx cesarean section     ??? Hx tubal ligation     ??? Hx ovarian cyst removal       on R         Family History   Problem Relation Age of Onset   ??? Bleeding Prob Father      Factor V   ??? Cancer Maternal Grandmother      metastatic breast  CA.   ??? Ovarian Cancer Mother 56     passed away due to ca   ??? Bipolar Disorder Mother    ??? Kidney Disease Brother      kidney stones        History     Social History   ??? Marital Status: DIVORCED     Spouse Name: N/A     Number of Children: N/A   ??? Years of Education: N/A     Occupational History   ??? Not on file.     Social History Main Topics   ??? Smoking status: Never Smoker    ??? Smokeless tobacco: Never Used   ??? Alcohol Use: Yes      Comment: rarely - only drinks 2 times per year   ??? Drug Use: No      Comment: narcotic seeking per hospital hx.   ??? Sexual Activity:     Partners: Male     Birth Control/ Protection: Surgical     Other Topics Concern   ??? Not on file     Social History Narrative                  ALLERGIES: Ativan; Codeine; Demerol; Iodinated contrast media - iv dye; Ketorolac tromethamine; Motrin; Pcn; Pneumococcal vaccine; Pneumovax 23; and Shellfish containing products      Review of Systems   Constitutional: Negative for fever and diaphoresis.   Respiratory: Negative for cough, hemoptysis, sputum production and shortness of breath.     Cardiovascular: Positive for chest pain. Negative for palpitations and near-syncope.   All other systems reviewed and are negative.      Filed Vitals:    09/04/13 1211 09/04/13 1319   BP: 141/69    Pulse: 89    Temp: 96.2 ??F (35.7 ??C)    Resp: 18    Height: $Remove'5\' 4"'hqhwjvj$  (1.626 m)    Weight: 64.411 kg (142 lb)    SpO2: 100% 99%            Physical Exam   Constitutional: She appears well-developed and well-nourished. No distress.   Not visible extremis, often resting   HENT:   Head: Atraumatic.   Eyes: No scleral icterus.   Neck: Neck supple.   Cardiovascular: Normal rate.  Exam reveals no friction rub.    No murmur heard.  Pulmonary/Chest: Effort normal. No respiratory distress. She has no wheezes.   Equal bilat   Abdominal: Soft. There is no tenderness. There is no rebound.   Musculoskeletal: She exhibits no edema or tenderness.   Neurological: She is alert. She exhibits normal muscle tone. Coordination normal.   Skin: Skin is warm and dry.   Psychiatric: Her behavior is normal. Thought content normal.   Nursing note and vitals reviewed.       MDM  Number of Diagnoses or Management Options  Chest pain, unspecified chest pain type:   Pulmonary embolism, Recurrent:   Diagnosis management comments: Very complex patient with long history of same and varies on studies as to if acute or chronic. With patient wishing now to not have studies and refusing to resume Arixtra will give dose Lovenox and patient has current same at home. She has appointment with Dr Yolanda Bonine on Tuesday       Amount and/or Complexity of Data Reviewed  Clinical lab tests: ordered and reviewed  Tests in the radiology section of CPT??: ordered and reviewed  Decide to obtain previous medical records or to obtain history  from someone other than the patient: yes  Obtain history from someone other than the patient: yes (Dr Charlette Caffey)    Risk of Complications, Morbidity, and/or Mortality  Presenting problems: high  Diagnostic procedures: moderate   Management options: moderate    Critical Care  Total time providing critical care: 30-74 minutes      Procedures    Recent Results (from the past 12 hour(s))   EKG, 12 LEAD, INITIAL    Collection Time: 09/04/13 12:05 PM   Result Value Ref Range    Systolic BP  mmHg    Diastolic BP  mmHg    Ventricular Rate 88 BPM    Atrial Rate 88 BPM    P-R Interval 166 ms    QRS Duration 88 ms    Q-T Interval 364 ms    QTC Calculation (Bezet) 440 ms    Calculated P Axis 71 degrees    Calculated R Axis 88 degrees    Calculated T Axis 79 degrees    Diagnosis       Normal sinus rhythm  Normal ECG  When compared with ECG of 26-Jun-2013 19:40,  No significant change was found     CBC WITH AUTOMATED DIFF    Collection Time: 09/04/13  1:50 PM   Result Value Ref Range    WBC 5.2 4.3 - 11.1 K/uL    RBC 3.51 (L) 4.05 - 5.25 M/uL    HGB 10.7 (L) 11.7 - 15.4 g/dL    HCT 33.1 (L) 35.8 - 46.3 %    MCV 94.3 79.6 - 97.8 FL    MCH 30.5 26.1 - 32.9 PG    MCHC 32.3 31.4 - 35.0 g/dL    RDW 14.1 11.9 - 14.6 %    PLATELET 238 150 - 450 K/uL    MPV 10.6 (L) 10.8 - 14.1 FL    DF AUTOMATED      NEUTROPHILS 48 43 - 78 %    LYMPHOCYTES 28 13 - 44 %    MONOCYTES 13 (H) 4.0 - 12.0 %    EOSINOPHILS 10 (H) 0.5 - 7.8 %    BASOPHILS 1 0.0 - 2.0 %    IMMATURE GRANULOCYTES 0.2 0.0 - 5.0 %    ABS. NEUTROPHILS 2.5 1.7 - 8.2 K/UL    ABS. LYMPHOCYTES 1.4 0.5 - 4.6 K/UL    ABS. MONOCYTES 0.7 0.1 - 1.3 K/UL    ABS. EOSINOPHILS 0.5 0.0 - 0.8 K/UL    ABS. BASOPHILS 0.0 0.0 - 0.2 K/UL    ABS. IMM. GRANS. 0.0 0.0 - 0.5 K/UL   METABOLIC PANEL, COMPREHENSIVE    Collection Time: 09/04/13  1:50 PM   Result Value Ref Range    Sodium 141 136 - 145 mmol/L    Potassium 4.8 3.5 - 5.1 mmol/L    Chloride 106 98 - 107 mmol/L    CO2 28 21 - 32 mmol/L    Anion gap 7 7 - 16 mmol/L    Glucose 102 (H) 65 - 100 mg/dL    BUN 15 6 - 23 MG/DL    Creatinine 0.90 0.6 - 1.0 MG/DL    GFR est AA >60 >60 ml/min/1.76m2    GFR est non-AA >60 >60 ml/min/1.85m2    Calcium 9.3 8.3 - 10.4 MG/DL     Bilirubin, total 0.2 0.2 - 1.1 MG/DL    ALT 20 12 - 65 U/L    AST 16 15 - 37 U/L    Alk. phosphatase 112  50 - 136 U/L    Protein, total 6.8 6.3 - 8.2 g/dL    Albumin 3.7 3.5 - 5.0 g/dL    Globulin 3.1 2.3 - 3.5 g/dL    A-G Ratio 1.2 1.2 - 3.5     D DIMER    Collection Time: 09/04/13  1:50 PM   Result Value Ref Range    D DIMER <0.19 <0.55 ug/ml(FEU)

## 2013-09-05 LAB — EKG, 12 LEAD, INITIAL
Atrial Rate: 88 {beats}/min
Calculated P Axis: 71 degrees
Calculated R Axis: 88 degrees
Calculated T Axis: 79 degrees
Diagnosis: NORMAL
P-R Interval: 166 ms
Q-T Interval: 364 ms
QRS Duration: 88 ms
QTC Calculation (Bezet): 440 ms
Ventricular Rate: 88 {beats}/min

## 2013-09-23 LAB — METABOLIC PANEL, COMPREHENSIVE
A-G Ratio: 1 — ABNORMAL LOW (ref 1.2–3.5)
ALT (SGPT): 18 U/L (ref 12–65)
AST (SGOT): 13 U/L — ABNORMAL LOW (ref 15–37)
Albumin: 3.6 g/dL (ref 3.5–5.0)
Alk. phosphatase: 104 U/L (ref 50–136)
Anion gap: 8 mmol/L (ref 7–16)
BUN: 11 MG/DL (ref 6–23)
Bilirubin, total: 0.3 MG/DL (ref 0.2–1.1)
CO2: 26 mmol/L (ref 21–32)
Calcium: 9.5 MG/DL (ref 8.3–10.4)
Chloride: 102 mmol/L (ref 98–107)
Creatinine: 1 MG/DL (ref 0.6–1.0)
GFR est AA: >60 mL/min/{1.73_m2} (ref >60)
GFR est non-AA: >60 mL/min/{1.73_m2} (ref >60)
Globulin: 3.6 g/dL — ABNORMAL HIGH (ref 2.3–3.5)
Glucose: 107 mg/dL — ABNORMAL HIGH (ref 65–100)
Potassium: 4.5 mmol/L (ref 3.5–5.1)
Protein, total: 7.2 g/dL (ref 6.3–8.2)
Sodium: 136 mmol/L (ref 136–145)

## 2013-09-23 LAB — CBC W/O DIFF
HCT: 31.6 % — ABNORMAL LOW (ref 35.8–46.3)
HGB: 10.5 g/dL — ABNORMAL LOW (ref 11.7–15.4)
MCH: 31 PG (ref 26.1–32.9)
MCHC: 33.2 g/dL (ref 31.4–35.0)
MCV: 93.2 FL (ref 79.6–97.8)
MPV: 9.8 FL — ABNORMAL LOW (ref 10.8–14.1)
PLATELET: 241 10*3/uL (ref 150–450)
RBC: 3.39 M/uL — ABNORMAL LOW (ref 4.05–5.25)
RDW: 13.9 % (ref 11.9–14.6)
WBC: 8.9 10*3/uL (ref 4.3–11.1)

## 2013-09-23 LAB — LIPASE: Lipase: 94 U/L (ref 73–393)

## 2013-09-23 LAB — AMYLASE: Amylase: 46 U/L (ref 25–115)

## 2013-09-23 MED ORDER — HEPARIN, PORCINE (PF) 100 UNIT/ML IV SYRINGE
100 unit/mL | INTRAVENOUS | Status: AC
Start: 2013-09-23 — End: 2013-09-23
  Administered 2013-09-23

## 2013-09-23 MED ORDER — ONDANSETRON (PF) 4 MG/2 ML INJECTION
4 mg/2 mL | INTRAMUSCULAR | Status: AC
Start: 2013-09-23 — End: 2013-09-23
  Administered 2013-09-23: 21:00:00 via INTRAVENOUS

## 2013-09-23 MED ORDER — SODIUM CHLORIDE 0.9 % IV
INTRAVENOUS | Status: DC
Start: 2013-09-23 — End: 2013-09-23
  Administered 2013-09-23: 21:00:00 via INTRAVENOUS

## 2013-09-23 MED FILL — ONDANSETRON (PF) 4 MG/2 ML INJECTION: 4 mg/2 mL | INTRAMUSCULAR | Qty: 2

## 2013-09-23 MED FILL — MONOJECT PREFILL ADVANCED (PF) 100 UNIT/ML INTRAVENOUS SYRINGE: 100 unit/mL | INTRAVENOUS | Qty: 3

## 2013-09-23 NOTE — ED Provider Notes (Addendum)
HPI Comments: 48 y/o f to ed with complaint of RUQ pain started yesterday, with nausea and vomiting as well.  Also complains with fever.  No cough or congestion, no diarrhea, no chest pain, no palpitations.  Chronic hematuria    Patient is a 48 y.o. female presenting with abdominal pain. [name] No language interpreter was used.   Abdominal Pain   This is a new problem. The current episode started yesterday. The problem occurs constantly. The problem has not changed since onset.The pain is associated with vomiting. The pain is located in the RLQ. Associated symptoms include a fever, nausea, vomiting, hematuria and back pain. Pertinent negatives include no anorexia, no belching, no diarrhea, no flatus, no hematochezia, no melena, no constipation, no dysuria, no frequency, no headaches, no arthralgias, no myalgias, no trauma and no chest pain.        Past Medical History   Diagnosis Date   ??? Factor V Leiden mutation (HCC)    ??? Infectious disease 02/2009      Hx MRSA / E.Coli bacteremia, MRSA wound infection 2/2 port   ??? Factor II deficiency (HCC)    ??? Calculus of kidney    ??? Depression    ??? Iron deficiency    ??? Migraines    ??? 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 Bipolar disorder (HCC)    ??? Anxiety    ??? Restless leg syndrome    ??? Hematuria, microscopic         Past Surgical History   Procedure Laterality Date   ??? Cystoscopy  ???     x 13   ??? Lithotripsy  2006   ??? Hx vascular access  July, 2010     Power Port placed in [place], [place] removed   ??? Hx vascular access Right 2013     pt has current rt subclavian port - placed at [facility]   ??? Hx appendectomy  ???   ??? Hx tonsillectomy     ??? Hx cesarean section     ??? Hx tubal ligation     ??? Hx ovarian cyst removal       on R         Family History   Problem Relation Age of Onset   ??? Bleeding Prob Father      Factor V   ??? Cancer Maternal Grandmother      metastatic breast CA.    ??? Ovarian Cancer Mother 33     passed away due to ca   ??? Bipolar Disorder Mother    ??? Kidney Disease Brother      kidney stones        History     Social History   ??? Marital Status: DIVORCED     Spouse Name: N/A     Number of Children: N/A   ??? Years of Education: N/A     Occupational History   ??? Not on file.     Social History Main Topics   ??? Smoking status: Never Smoker    ??? Smokeless tobacco: Never Used   ??? Alcohol Use: Yes      Comment: rarely - only drinks 2 times per year   ??? Drug Use: No      Comment: narcotic seeking per hospital hx.   ??? Sexual Activity:     Partners: Male     Pharmacist, hospital  Protection: Surgical     Other Topics Concern   ??? Not on file     Social History Narrative                  ALLERGIES: Ativan; Codeine; Demerol; Iodinated contrast media - iv dye; Ketorolac tromethamine; Motrin; Pcn; Pneumococcal vaccine; Pneumovax 23; and Shellfish containing products      Review of Systems   Constitutional: Positive for fever and appetite change. Negative for chills.   HENT: Negative for ear pain and facial swelling.    Eyes: Negative for discharge and itching.   Respiratory: Negative for cough and shortness of breath.    Cardiovascular: Negative for chest pain.        Abdominal pain and right upper quadrant goes straight through to her back.   Gastrointestinal: Positive for nausea, vomiting and abdominal pain. Negative for diarrhea, constipation, melena, hematochezia, anorexia and flatus.   Endocrine: Negative for cold intolerance and heat intolerance.   Genitourinary: Positive for hematuria. Negative for dysuria and frequency.        Chronic hematuria   Musculoskeletal: Positive for back pain. Negative for myalgias and arthralgias.   Skin: Negative for rash and wound.   Neurological: Negative for light-headedness, numbness and headaches.   Psychiatric/Behavioral: Negative for confusion and decreased concentration.       Filed Vitals:    09/23/13 1611   BP: 133/100   Pulse: 91   Temp: 99 ??F (37.2 ??C)    Resp: 18   Height: 5\' 4"  (1.626 m)   Weight: 63.957 kg (141 lb)   SpO2: 95%            Physical Exam   Constitutional: She is oriented to person, place, and time. She appears well-developed and well-nourished. No distress.   HENT:   Head: Normocephalic and atraumatic.   Right Ear: External ear normal.   Left Ear: External ear normal.   Nose: Nose normal.   Eyes: Conjunctivae and EOM are normal. Pupils are equal, round, and reactive to light.   Neck: Normal range of motion. Neck supple.   Cardiovascular: Normal rate, regular rhythm and normal heart sounds.    Pulmonary/Chest: Effort normal and breath sounds normal. No respiratory distress. She has no wheezes.   Abdominal: Soft. Bowel sounds are normal. She exhibits no distension. There is tenderness.   Tenderness to right upper quadrant with gentle palpation.  Bowel sounds present   Musculoskeletal: Normal range of motion. She exhibits no edema or tenderness.   Neurological: She is alert and oriented to person, place, and time. No cranial nerve deficit. Coordination normal.   Skin: Skin is warm and dry. No rash noted.   Psychiatric: She has a normal mood and affect. Her behavior is normal. Judgment and thought content normal.   Nursing note and vitals reviewed.       MDM  Number of Diagnoses or Management Options  Diagnosis management comments: 48 year old female frequent visitor of the ER today complains with right upper quadrant pain started just today she straight through to the right upper back.  She's also had vomiting and temperature of 101 yesterday.  We'll evaluate for gallbladder versus viral illness  6:40 PM  Blood has been obtained and sent to lab - waiting results now.  dhec website reviewed and pt with >40 controlled rx in 12 mos.  7:16 PM  Dr circle at bedside and reviewed pt rx hsx - as well as repeated visits in ed that have resulted with  negative findings.  Pt affirms she understands no pain meds today or in the future unless crisis is present.   Will dc home with family to follow up with her family md       Amount and/or Complexity of Data Reviewed  Clinical lab tests: ordered and reviewed  Discuss the patient with other providers: yes (Circle  )    Risk of Complications, Morbidity, and/or Mortality  Presenting problems: minimal  Diagnostic procedures: low  Management options: minimal    Patient Progress  Patient progress: improved      Procedures

## 2013-09-23 NOTE — ED Notes (Signed)
C/o RUQ abd pain onset yesterday, sts was here yesterday for same but had to leave prior to treatment

## 2013-10-18 NOTE — Telephone Encounter (Signed)
noted 

## 2013-10-18 NOTE — Telephone Encounter (Signed)
Patient was a no show for appointment on 10/18/2013 for 6 week follow up with KUB .  Please Review.

## 2013-10-27 NOTE — Telephone Encounter (Signed)
Dr Riley Lam called for an office  visit for this pt painless gross hematuria. She was seen by urologist at [facility] and just needs a follow up.   Appt made for 1:40 Sept 28th.

## 2013-11-07 NOTE — Telephone Encounter (Signed)
Patient was a no show for appointment on 11/07/2013 for painless gross hematuria .  Please Review.

## 2013-11-07 NOTE — Telephone Encounter (Signed)
Please call pt to reschedule.

## 2013-11-09 NOTE — Telephone Encounter (Signed)
Called patient regarding no show appointment for painless gross hematuria/left message to call and reschedule.

## 2013-11-11 ENCOUNTER — Encounter: Payer: MEDICARE | Attending: Urology | Primary: Student in an Organized Health Care Education/Training Program

## 2013-11-27 ENCOUNTER — Inpatient Hospital Stay
Admit: 2013-11-27 | Discharge: 2013-11-27 | Disposition: A | Discharge status: Home / Self Care | Payer: MEDICARE | Attending: Emergency Medicine

## 2013-11-27 DIAGNOSIS — R1013 Epigastric pain: Secondary | ICD-10-CM

## 2013-11-27 LAB — METABOLIC PANEL, COMPREHENSIVE
A-G Ratio: 1.2 (ref 1.2–3.5)
ALT (SGPT): 21 U/L (ref 12–65)
AST (SGOT): 20 U/L (ref 15–37)
Albumin: 4.5 g/dL (ref 3.5–5.0)
Alk. phosphatase: 131 U/L (ref 50–136)
Anion gap: 14 mmol/L (ref 7–16)
BUN: 19 MG/DL (ref 6–23)
Bilirubin, total: 0.3 MG/DL (ref 0.2–1.1)
CO2: 21 mmol/L (ref 21–32)
Calcium: 9.8 MG/DL (ref 8.3–10.4)
Chloride: 103 mmol/L (ref 98–107)
Creatinine: 0.9 MG/DL (ref 0.6–1.0)
GFR est AA: >60 mL/min/{1.73_m2} (ref >60)
GFR est non-AA: >60 mL/min/{1.73_m2} (ref >60)
Globulin: 3.9 g/dL — ABNORMAL HIGH (ref 2.3–3.5)
Glucose: 93 mg/dL (ref 65–100)
Potassium: 4.8 mmol/L (ref 3.5–5.1)
Protein, total: 8.4 g/dL — ABNORMAL HIGH (ref 6.3–8.2)
Sodium: 138 mmol/L (ref 136–145)

## 2013-11-27 LAB — URINE MICROSCOPIC

## 2013-11-27 LAB — CBC WITH AUTOMATED DIFF
ABS. BASOPHILS: 0.1 10*3/uL (ref 0.0–0.2)
ABS. EOSINOPHILS: 0.7 10*3/uL (ref 0.0–0.8)
ABS. IMM. GRANS.: 0 10*3/uL (ref 0.0–0.5)
ABS. LYMPHOCYTES: 2.2 10*3/uL (ref 0.5–4.6)
ABS. MONOCYTES: 0.7 10*3/uL (ref 0.1–1.3)
ABS. NEUTROPHILS: 2.8 10*3/uL (ref 1.7–8.2)
BASOPHILS: 1 % (ref 0.0–2.0)
EOSINOPHILS: 11 % — ABNORMAL HIGH (ref 0.5–7.8)
HCT: 42.3 % (ref 35.8–46.3)
HGB: 13.2 g/dL (ref 11.7–15.4)
IMMATURE GRANULOCYTES: 0.3 % (ref 0.0–5.0)
LYMPHOCYTES: 35 % (ref 13–44)
MCH: 29.3 PG (ref 26.1–32.9)
MCHC: 31.2 g/dL — ABNORMAL LOW (ref 31.4–35.0)
MCV: 93.8 FL (ref 79.6–97.8)
MONOCYTES: 11 % (ref 4.0–12.0)
MPV: 10.6 FL — ABNORMAL LOW (ref 10.8–14.1)
NEUTROPHILS: 42 % — ABNORMAL LOW (ref 43–78)
PLATELET: 457 10*3/uL — ABNORMAL HIGH (ref 150–450)
RBC: 4.51 M/uL (ref 4.05–5.25)
RDW: 14.7 % — ABNORMAL HIGH (ref 11.9–14.6)
WBC: 6.5 10*3/uL (ref 4.3–11.1)

## 2013-11-27 MED ORDER — ONDANSETRON 8 MG TAB, RAPID DISSOLVE
8 mg | ORAL | Status: AC
Start: 2013-11-27 — End: 2013-11-27
  Administered 2013-11-27: 19:00:00 via ORAL

## 2013-11-27 MED ORDER — HYDROMORPHONE (PF) 1 MG/ML IJ SOLN
1 mg/mL | Freq: Once | INTRAMUSCULAR | Status: AC
Start: 2013-11-27 — End: 2013-11-27
  Administered 2013-11-27: 21:00:00 via INTRAMUSCULAR

## 2013-11-27 MED ORDER — SUCRALFATE 100 MG/ML ORAL SUSP
100 mg/mL | ORAL | Status: AC
Start: 2013-11-27 — End: 2013-11-27
  Administered 2013-11-27: 21:00:00 via ORAL

## 2013-11-27 MED ORDER — SUCRALFATE 100 MG/ML ORAL SUSP
100 mg/mL | Freq: Four times a day (QID) | ORAL | Status: AC
Start: 2013-11-27 — End: 2013-12-04

## 2013-11-27 MED FILL — ONDANSETRON 8 MG TAB, RAPID DISSOLVE: 8 mg | ORAL | Qty: 1

## 2013-11-27 MED FILL — HYDROMORPHONE (PF) 1 MG/ML IJ SOLN: 1 mg/mL | INTRAMUSCULAR | Qty: 1

## 2013-11-27 MED FILL — SUCRALFATE 100 MG/ML ORAL SUSP: 100 mg/mL | ORAL | Qty: 10

## 2013-11-27 NOTE — ED Notes (Signed)
The patient was given their discharge instructions and  was given prescriptions.   The  patient verbalized understanding and had no additional questions. The patient was alert and was discharged via Ambulatory, without additional complaints at time of discharge.  No apparent distress noted

## 2013-11-27 NOTE — ED Notes (Signed)
Abdominal pain since 12OCT15. Called [facility], they told her to come to ER. Pt states she had some bloody vomitus this am.

## 2013-11-28 NOTE — ED Provider Notes (Signed)
HPI Comments: Patient along a complicated primary medical history including GI disease for which she is seen by Dr. Jonell Cluckickoff and also hematologic illnesses for which she is cared for by Dr. Macy MisJ Walls.  She is here with nonlocalizing but mainly upper mid abdominal pain for a period of time.  [facility] and they sent her to the emergency room.  They additionally stated that they would call her tomorrow morning and would make certain that she was worked in in a prompt fashion.  His had no fever or chills or infectious symptoms she did vomit several times and one time there is some slight blood streaking within that she has vomited no blood or no coffee ground emesis.  In our department she is actually feeling better than she was prior    Patient is a 48 y.o. female presenting with abdominal pain. [name]   Abdominal Pain   This is a recurrent problem. The pain is associated with an unknown factor. The pain is located in the epigastric region. The pain is moderate. Associated symptoms include vomiting. Pertinent negatives include no fever, no diarrhea, no hematochezia, no melena and no constipation. Nothing worsens the pain. The pain is relieved by nothing. The patient's surgical history includes appendectomy.       Past Medical History   Diagnosis Date   ??? Factor V Leiden mutation (HCC)    ??? Infectious disease 02/2009      Hx MRSA / E.Coli bacteremia, MRSA wound infection 2/2 port   ??? Factor II deficiency (HCC)    ??? Calculus of kidney    ??? Depression    ??? Iron deficiency    ??? Migraines    ??? 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 Bipolar disorder (HCC)    ??? Anxiety    ??? Restless leg syndrome    ??? Hematuria, microscopic         Past Surgical History   Procedure Laterality Date   ??? Cystoscopy  ???     x 13   ??? Lithotripsy  2006   ??? Hx vascular access  July, 2010     [place]    ??? Hx vascular access Right 2013     pt has current rt subclavian port [facility]   ??? Hx appendectomy  ???   ??? Hx tonsillectomy     ??? Hx cesarean section     ??? Hx tubal ligation     ??? Hx ovarian cyst removal       on R         Family History   Problem Relation Age of Onset   ??? Bleeding Prob Father      Factor V   ??? Cancer Maternal Grandmother      metastatic breast CA.   ??? Ovarian Cancer Mother [id number]     passed away due to ca   ??? Bipolar Disorder Mother    ??? Kidney Disease Brother      kidney stones        History     Social History   ??? Marital Status: DIVORCED     Spouse Name: N/A     Number of Children: N/A   ??? Years of Education: N/A     Occupational History   ??? Not on file.  Social History Main Topics   ??? Smoking status: Never Smoker    ??? Smokeless tobacco: Never Used   ??? Alcohol Use: Yes      Comment: rarely - only drinks 2 times per year   ??? Drug Use: No      Comment: narcotic seeking per hospital hx.   ??? Sexual Activity:     Partners: Male     Birth Control/ Protection: Surgical     Other Topics Concern   ??? Not on file     Social History Narrative                  ALLERGIES: Ativan; Codeine; Demerol; Iodinated contrast media - iv dye; Ketorolac tromethamine; Motrin; Pcn; Pneumococcal vaccine; Pneumovax 23; and Shellfish containing products      Review of Systems   Constitutional: Negative for fever.   Gastrointestinal: Positive for vomiting and abdominal pain. Negative for diarrhea, constipation, melena and hematochezia.   All other systems reviewed and are negative.      Filed Vitals:    11/27/13 1130 11/27/13 1301 11/27/13 1401 11/27/13 1717   BP: 120/71  133/70 134/72   Pulse: 98  86 84   Temp: 99 ??F (37.2 ??C)   98.7 ??F (37.1 ??C)   Resp: 18  16 16    Height: 5' 4.5" (1.638 m)      Weight: 64.411 kg (142 lb)      SpO2: 98% 99% 99% 98%            Physical Exam   Constitutional: She appears well-developed and well-nourished. No distress.   Not toxic or septic     HENT:   Head: Atraumatic.    Mouth/Throat: No oropharyngeal exudate.   Eyes: No scleral icterus.   Neck: Neck supple.   Cardiovascular: Normal rate, regular rhythm and normal heart sounds.    Pulmonary/Chest: Effort normal. No respiratory distress.   Abdominal: Soft. There is no tenderness. There is no rebound.   Musculoskeletal: She exhibits no edema or tenderness.   Lymphadenopathy:     She has no cervical adenopathy.   Neurological: She is alert.   Skin: Skin is warm and dry.   Psychiatric: Thought content normal.   Nursing note and vitals reviewed.       MDM  Number of Diagnoses or Management Options  Abdominal pain, epigastric:   Diagnosis management comments: Stable and resolving symptoms. Patient will return if any worsening       Amount and/or Complexity of Data Reviewed  Clinical lab tests: reviewed and ordered  Decide to obtain previous medical records or to obtain history from someone other than the patient: yes  Review and summarize past medical records: yes    Risk of Complications, Morbidity, and/or Mortality  Presenting problems: moderate  Diagnostic procedures: moderate  Management options: moderate    Patient Progress  Patient progress: stable      Procedures

## 2013-12-05 ENCOUNTER — Encounter: Payer: MEDICARE | Attending: Urology | Primary: Student in an Organized Health Care Education/Training Program

## 2013-12-05 NOTE — Telephone Encounter (Signed)
Patient was a no show for appointment on 12/05/2013 for 6 week follow up with KUB .  Please Review.

## 2013-12-05 NOTE — Telephone Encounter (Signed)
noted 

## 2013-12-13 ENCOUNTER — Inpatient Hospital Stay: Primary: Student in an Organized Health Care Education/Training Program

## 2013-12-14 NOTE — Other (Signed)
No answer to phone for phone assessment after several attempts.    Left message instructing on place to arrive, NPO after MN (unless gastroenterologist said clear liquids were allowed until a certain time on the day of procedure); pt to follow doctor's orders for time of arrival.    Pt to bring medications to hospital if applicable, no jewelry, wash up with antibacterial soap; call MD to cancel.

## 2013-12-26 ENCOUNTER — Encounter: Primary: Student in an Organized Health Care Education/Training Program

## 2014-01-03 ENCOUNTER — Inpatient Hospital Stay
Admit: 2014-01-03 | Discharge: 2014-01-03 | Disposition: A | Discharge status: Home / Self Care | Payer: MEDICARE | Attending: Emergency Medicine

## 2014-01-03 DIAGNOSIS — R51 Headache: Secondary | ICD-10-CM

## 2014-01-03 MED ORDER — DIPHENHYDRAMINE 25 MG CAP
25 mg | ORAL | Status: AC
Start: 2014-01-03 — End: 2014-01-03
  Administered 2014-01-03: 16:00:00 via ORAL

## 2014-01-03 MED ORDER — PROMETHAZINE 25 MG TAB
25 mg | ORAL | Status: AC
Start: 2014-01-03 — End: 2014-01-03
  Administered 2014-01-03: 16:00:00 via ORAL

## 2014-01-03 MED ORDER — BUTORPHANOL TARTRATE 2 MG/ML IJ SOLN
2 mg/mL | INTRAMUSCULAR | Status: AC
Start: 2014-01-03 — End: 2014-01-03
  Administered 2014-01-03: 16:00:00 via INTRAMUSCULAR

## 2014-01-03 MED FILL — BUTORPHANOL TARTRATE 2 MG/ML IJ SOLN: 2 mg/mL | INTRAMUSCULAR | Qty: 1

## 2014-01-03 MED FILL — PROMETHAZINE 25 MG TAB: 25 mg | ORAL | Qty: 1

## 2014-01-03 MED FILL — DIPHENHYDRAMINE 25 MG CAP: 25 mg | ORAL | Qty: 1

## 2014-01-03 NOTE — ED Provider Notes (Signed)
HPI Comments: Pt returns to er for migraine headache pan not relieved with at home imitrex, no fever, + nv started last night, has pmd, took taxi here, will take taxi home, seen here for same    Patient is a 48 y.o. female presenting with migraines. [name]   Migraine   This is a recurrent problem. The current episode started 2 days ago. The problem occurs constantly. The problem has not changed since onset.The headache is aggravated by an unknown factor. The pain is located in the generalized region. The quality of the pain is described as dull. The pain is at a severity of 9/10. The pain is moderate. Associated symptoms include nausea and vomiting. Pertinent negatives include no fever. She has tried triptan therapy for the symptoms. The treatment provided no relief.        Past Medical History   Diagnosis Date   ??? Factor V Leiden mutation (HCC)    ??? Infectious disease 02/2009      Hx MRSA / E.Coli bacteremia, MRSA wound infection 2/2 port   ??? Factor II deficiency (HCC)    ??? Calculus of kidney    ??? Depression    ??? Iron deficiency    ??? Migraines    ??? 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 Bipolar disorder (HCC)    ??? Anxiety    ??? Restless leg syndrome    ??? Hematuria, microscopic    ;     Past Surgical History   Procedure Laterality Date   ??? Cystoscopy  ???     x 13   ??? Lithotripsy  2006   ??? Hx vascular access  July, 2010     [place]   ??? Hx vascular access Right 2013     pt [facility]   ??? Hx appendectomy  ???   ??? Hx tonsillectomy     ??? Hx cesarean section     ??? Hx tubal ligation     ??? Hx ovarian cyst removal       on R         Family History   Problem Relation Age of Onset   ??? Bleeding Prob Father      Factor V   ??? Cancer Maternal Grandmother      metastatic breast CA.   ??? Ovarian Cancer Mother [id number]     passed away due to ca    ??? Bipolar Disorder Mother    ??? Kidney Disease Brother      kidney stones        History     Social History   ??? Marital Status: DIVORCED     Spouse Name: N/A     Number of Children: N/A   ??? Years of Education: N/A     Occupational History   ??? Not on file.     Social History Main Topics   ??? Smoking status: Never Smoker    ??? Smokeless tobacco: Never Used   ??? Alcohol Use: Yes      Comment: rarely - only drinks 2 times per year   ??? Drug Use: No      Comment: narcotic seeking per hospital hx.   ??? Sexual Activity:     Partners: Male     Pharmacist, hospitalBirth Control/ Protection: Surgical  Other Topics Concern   ??? Not on file     Social History Narrative                ALLERGIES: Ativan; Codeine; Demerol; Iodinated contrast media - iv dye; Ketorolac tromethamine; Motrin; Pcn; Pneumococcal vaccine; Pneumovax 23; and Shellfish containing products      Review of Systems   Constitutional: Negative for fever.   Gastrointestinal: Positive for nausea and vomiting.   All other systems reviewed and are negative.      Filed Vitals:    01/03/14 0958 01/03/14 1025   BP: 168/74    Pulse: 100    Temp: 98.4 ??F (36.9 ??C)    Resp: 18    Height: 5' 4.5" (1.638 m)    Weight: 64.411 kg (142 lb)    SpO2: 98% 97%            Physical Exam   Constitutional: She is oriented to person, place, and time. She appears well-developed and well-nourished. No distress.   HENT:   Head: Normocephalic and atraumatic.   Eyes: Conjunctivae and EOM are normal. Pupils are equal, round, and reactive to light.   Neck: Normal range of motion. Neck supple.   Cardiovascular: Normal rate and regular rhythm.    Pulmonary/Chest: Effort normal and breath sounds normal. No respiratory distress. She has no wheezes.   Abdominal: Soft. Bowel sounds are normal. There is no tenderness. There is no rebound.   Musculoskeletal: She exhibits no edema.   Neurological: She is alert and oriented to person, place, and time. She has normal reflexes. No cranial nerve deficit. Coordination normal.    No meningeal signs, pt able to walk about the er comfortably    Skin: Skin is warm and dry. No rash noted.   Psychiatric: She has a normal mood and affect.   Nursing note and vitals reviewed.       MDM  Number of Diagnoses or Management Options  Diagnosis management comments: meds given for headache, pt up to bathroom w/o assistance        Amount and/or Complexity of Data Reviewed  Review and summarize past medical records: yes    Risk of Complications, Morbidity, and/or Mortality  Presenting problems: low  Diagnostic procedures: low  Management options: low    Patient Progress  Patient progress: improved      Procedures

## 2014-01-03 NOTE — ED Notes (Signed)
Pt left prior to receiving discharge directions or repeat vital signs.

## 2014-01-03 NOTE — ED Notes (Signed)
Pt is not on her bed at this time. Theodis AguasS. Fisher, PA went to check on her.

## 2014-01-03 NOTE — ED Notes (Signed)
Pt c/o headache since 21NOV15. Pt describes it as a "Lightening storm"

## 2014-01-30 ENCOUNTER — Inpatient Hospital Stay
Admit: 2014-01-30 | Discharge: 2014-01-30 | Disposition: A | Discharge status: Home / Self Care | Payer: MEDICARE | Attending: Emergency Medicine

## 2014-01-30 NOTE — ED Notes (Signed)
Dr Manson PasseyBrown in to see pt, not found in the room at this time. Not in bathrooms or waiting area.

## 2014-01-30 NOTE — ED Notes (Signed)
Pt presents with c/o shortness of breath and pain when she breathes. Pt states onset of pain was a few hours ago and she thinks she has another PE. Per chart review pt [facility] [place] last week. Pt had negative workup at that time. Pt states pain is the same as that day, no better no worse.

## 2014-01-30 NOTE — ED Notes (Signed)
Dr. Manson PasseyBrown states pt is not in her room. I called her cell phone and left a message for her to return to her room or let us know that she has left. Unable to locate her.

## 2014-03-16 ENCOUNTER — Inpatient Hospital Stay
Admit: 2014-03-16 | Discharge: 2014-03-17 | Disposition: A | Discharge status: Home / Self Care | Payer: MEDICARE | Attending: Emergency Medicine

## 2014-03-16 ENCOUNTER — Emergency Department: Admit: 2014-03-16 | Payer: MEDICARE | Primary: Student in an Organized Health Care Education/Training Program

## 2014-03-16 DIAGNOSIS — J188 Other pneumonia, unspecified organism: Secondary | ICD-10-CM

## 2014-03-16 LAB — METABOLIC PANEL, COMPREHENSIVE
A-G Ratio: 0.9 — ABNORMAL LOW (ref 1.2–3.5)
ALT (SGPT): 39 U/L (ref 12–65)
AST (SGOT): 37 U/L (ref 15–37)
Albumin: 3.5 g/dL (ref 3.5–5.0)
Alk. phosphatase: 154 U/L — ABNORMAL HIGH (ref 50–136)
Anion gap: 12 mmol/L (ref 7–16)
BUN: 9 MG/DL (ref 6–23)
Bilirubin, total: 0.1 MG/DL — ABNORMAL LOW (ref 0.2–1.1)
CO2: 23 mmol/L (ref 21–32)
Calcium: 8.9 MG/DL (ref 8.3–10.4)
Chloride: 105 mmol/L (ref 98–107)
Creatinine: 0.88 MG/DL (ref 0.6–1.0)
GFR est AA: >60 mL/min/{1.73_m2} (ref >60)
GFR est non-AA: >60 mL/min/{1.73_m2} (ref >60)
Globulin: 4 g/dL — ABNORMAL HIGH (ref 2.3–3.5)
Glucose: 102 mg/dL — ABNORMAL HIGH (ref 65–100)
Potassium: 4 mmol/L (ref 3.5–5.1)
Protein, total: 7.5 g/dL (ref 6.3–8.2)
Sodium: 140 mmol/L (ref 136–145)

## 2014-03-16 LAB — CBC WITH AUTOMATED DIFF
ABS. BASOPHILS: 0 10*3/uL (ref 0.0–0.2)
ABS. EOSINOPHILS: 0.7 10*3/uL (ref 0.0–0.8)
ABS. IMM. GRANS.: 0 10*3/uL (ref 0.0–0.5)
ABS. LYMPHOCYTES: 1.3 10*3/uL (ref 0.5–4.6)
ABS. MONOCYTES: 0.4 10*3/uL (ref 0.1–1.3)
ABS. NEUTROPHILS: 5.5 10*3/uL (ref 1.7–8.2)
BASOPHILS: 0 % (ref 0.0–2.0)
EOSINOPHILS: 9 % — ABNORMAL HIGH (ref 0.5–7.8)
HCT: 32.7 % — ABNORMAL LOW (ref 35.8–46.3)
HGB: 10.2 g/dL — ABNORMAL LOW (ref 11.7–15.4)
IMMATURE GRANULOCYTES: 0.1 % (ref 0.0–5.0)
LYMPHOCYTES: 16 % (ref 13–44)
MCH: 28.4 PG (ref 26.1–32.9)
MCHC: 31.2 g/dL — ABNORMAL LOW (ref 31.4–35.0)
MCV: 91.1 FL (ref 79.6–97.8)
MONOCYTES: 5 % (ref 4.0–12.0)
MPV: 10.9 FL (ref 10.8–14.1)
NEUTROPHILS: 70 % (ref 43–78)
PLATELET: 261 10*3/uL (ref 150–450)
RBC: 3.59 M/uL — ABNORMAL LOW (ref 4.05–5.25)
RDW: 15 % — ABNORMAL HIGH (ref 11.9–14.6)
WBC: 8 10*3/uL (ref 4.3–11.1)

## 2014-03-16 LAB — EKG, 12 LEAD, INITIAL
Atrial Rate: 91 {beats}/min
Calculated P Axis: 71 degrees
Calculated R Axis: 88 degrees
Calculated T Axis: 59 degrees
Diagnosis: NORMAL
P-R Interval: 162 ms
Q-T Interval: 364 ms
QRS Duration: 92 ms
QTC Calculation (Bezet): 447 ms
Ventricular Rate: 91 {beats}/min

## 2014-03-16 LAB — URINE MICROSCOPIC
Casts: 0 /lpf
RBC: >100 /hpf

## 2014-03-16 LAB — MAGNESIUM: Magnesium: 1.8 mg/dL (ref 1.8–2.4)

## 2014-03-16 LAB — TROPONIN I: Troponin-I, Qt.: <0.04 NG/ML (ref 0.02–0.05)

## 2014-03-16 MED ORDER — ONDANSETRON (PF) 4 MG/2 ML INJECTION
4 mg/2 mL | INTRAMUSCULAR | Status: AC
Start: 2014-03-16 — End: 2014-03-16
  Administered 2014-03-16: 17:00:00 via INTRAVENOUS

## 2014-03-16 MED ORDER — HYDROMORPHONE (PF) 1 MG/ML IJ SOLN
1 mg/mL | INTRAMUSCULAR | Status: AC
Start: 2014-03-16 — End: 2014-03-16
  Administered 2014-03-16: 23:00:00 via INTRAVENOUS

## 2014-03-16 MED ORDER — ONDANSETRON (PF) 4 MG/2 ML INJECTION
4 mg/2 mL | INTRAMUSCULAR | Status: AC
Start: 2014-03-16 — End: 2014-03-16
  Administered 2014-03-16: 23:00:00 via INTRAVENOUS

## 2014-03-16 MED ORDER — HYDROMORPHONE (PF) 1 MG/ML IJ SOLN
1 mg/mL | INTRAMUSCULAR | Status: AC
Start: 2014-03-16 — End: 2014-03-16
  Administered 2014-03-16: 17:00:00 via INTRAVENOUS

## 2014-03-16 MED ORDER — HYDROMORPHONE (PF) 1 MG/ML IJ SOLN
1 mg/mL | INTRAMUSCULAR | Status: AC
Start: 2014-03-16 — End: 2014-03-16
  Administered 2014-03-16: 20:00:00 via INTRAVENOUS

## 2014-03-16 MED FILL — ONDANSETRON (PF) 4 MG/2 ML INJECTION: 4 mg/2 mL | INTRAMUSCULAR | Qty: 2

## 2014-03-16 MED FILL — HYDROMORPHONE (PF) 1 MG/ML IJ SOLN: 1 mg/mL | INTRAMUSCULAR | Qty: 1

## 2014-03-16 NOTE — Progress Notes (Signed)
Visited with patient.  She states that she does not have a PCP - the [place].  She states her 'blood specialist' is Dr Shearon StallsJay Walls.  She would like assistance in finding a PCP.  Call to [facility] RN with [facility] to find out which MDs are accepting new patients.  Left message.  Call to [facility] and made an appointment with Dr Beather ArbourMalvern on 3/11 @ 1:20.  Email to [facility] CM and notified of above - asked to follow.

## 2014-03-16 NOTE — ED Notes (Signed)
Pt states that she started having right sided chest pain this morning, described as stabbing and "constant with deep breaths." Pt also c/o nausea. No other symptoms described.

## 2014-03-16 NOTE — ED Notes (Signed)
Continuing to wait for an INR.

## 2014-03-16 NOTE — ED Notes (Signed)
Partially 4 hours later, still awaiting an INR result.  Discussed case with Dr. Irving CopasFinn will assume care for the patient at this time

## 2014-03-16 NOTE — ED Notes (Signed)
The patient was given their discharge instructions and  was given prescriptions.   The  patient verbalized understanding and had no additional questions. The patient was alert and was discharged via Ambulatory, without additional complaints at time of discharge.  No apparent distress noted

## 2014-03-16 NOTE — ED Provider Notes (Addendum)
Patient is a 49 y.o. female presenting with chest pain. [name]   Chest Pain (Angina)   This is a recurrent problem. The current episode started 6 to 12 hours ago. The problem has not changed since onset.Duration of episode(s) is 8 hours. The problem occurs constantly. The pain is associated with normal activity. The pain is present in the substernal region and right side. The pain is at a severity of 8/10. The quality of the pain is described as pleuritic. The pain radiates to the mid back. The symptoms are aggravated by deep breathing and movement. Associated symptoms include malaise/fatigue, nausea and shortness of breath. Pertinent negatives include no abdominal pain, no back pain, no claudication, no cough, no diaphoresis, no dizziness, no exertional chest pressure, no fever, no headaches, no hemoptysis, no irregular heartbeat, no leg pain, no lower extremity edema, no near-syncope, no numbness, no orthopnea, no palpitations, no PND, no sputum production, no vomiting and no weakness. She has tried rest for the symptoms. The treatment provided no relief. 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 Her past medical history is significant for PE.Her past medical history does not include aneurysm, cancer, DM, DVT, HTN or CHF. Pertinent negatives include no cardiac catheterization.       Past Medical History:   Diagnosis Date   ??? 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 Infectious disease 02/2009      Hx MRSA / E.Coli bacteremia, MRSA wound infection 2/2 port   ??? 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 Calculus of kidney    ??? Depression    ??? Iron deficiency    ??? Migraines    ??? 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 Bipolar disorder ([place])    ??? Anxiety    ??? Restless leg syndrome    ??? Hematuria, microscopic        Past Surgical History:    Procedure Laterality Date   ??? Cystoscopy  ???     x 13   ??? Lithotripsy  2006   ??? Hx vascular access  July, 2010     Power Port placed in [place], [place] removed   ??? Hx vascular access Right 2013     pt has current rt subclavian port - placed at [facility]   ??? Hx appendectomy  ???   ??? Hx tonsillectomy     ??? Hx cesarean section     ??? Hx tubal ligation     ??? Hx ovarian cyst removal       on R         Family History:   Problem Relation Age of Onset   ??? Bleeding Prob Father      Factor V   ??? Cancer Maternal Grandmother      metastatic breast CA.   ??? Ovarian Cancer Mother 58     passed away due to ca   ??? Bipolar Disorder Mother    ??? Kidney Disease Brother      kidney stones       History     Social History   ??? Marital Status: DIVORCED     Spouse Name: N/A     Number of Children: N/A   ??? Years of Education: N/A     Occupational History   ??? Not on file.  Social History Main Topics   ??? Smoking status: Never Smoker    ??? Smokeless tobacco: Never Used   ??? Alcohol Use: Yes      Comment: rarely - only drinks 2 times per year   ??? Drug Use: No      Comment: narcotic seeking per hospital hx.   ??? Sexual Activity:     Partners: Male     Birth Control/ Protection: Surgical     Other Topics Concern   ??? Not on file     Social History Narrative           ALLERGIES: Ativan; Codeine; Demerol; Iodinated contrast media - iv dye; Ketorolac tromethamine; Motrin; Pcn; Pneumococcal vaccine; Pneumovax 23; and Shellfish containing products      Review of Systems   Constitutional: Positive for malaise/fatigue. Negative for fever, diaphoresis and appetite change.   Respiratory: Positive for shortness of breath. Negative for cough, hemoptysis, sputum production and wheezing.    Cardiovascular: Positive for chest pain. Negative for palpitations, orthopnea, claudication, PND and near-syncope.   Gastrointestinal: Positive for nausea. Negative for vomiting and abdominal pain.   Musculoskeletal: Negative for back pain.    Neurological: Negative for dizziness, weakness, numbness and headaches.   All other systems reviewed and are negative.      Filed Vitals:    03/16/14 1108 03/16/14 1110   BP: 138/77    Pulse: 89    Temp: 98.3 ??F (36.8 ??C)    Resp: 18    Height: 5' 4.5" (1.638 m)    Weight: 69.854 kg (154 lb)    SpO2: 94% 93%            Physical Exam   Constitutional: She is oriented to person, place, and time. She appears well-developed and well-nourished. She appears distressed (mild).   HENT:   Head: Normocephalic and atraumatic.   Right Ear: Tympanic membrane and external ear normal.   Left Ear: Tympanic membrane and external ear normal.   Mouth/Throat: Oropharynx is clear and moist.   Eyes: Conjunctivae and EOM are normal. Pupils are equal, round, and reactive to light.   Neck: Normal range of motion. Neck supple. No tracheal deviation present.   Cardiovascular: Normal rate, regular rhythm, normal heart sounds and intact distal pulses.  Exam reveals no gallop and no friction rub.    No murmur heard.  Pulmonary/Chest: Effort normal and breath sounds normal. No respiratory distress. She has no wheezes.   Abdominal: Soft. Bowel sounds are normal. She exhibits no distension and no mass. There is no hepatosplenomegaly. There is no tenderness. There is no rebound and no guarding.   Musculoskeletal: Normal range of motion. She exhibits no edema.   Lymphadenopathy:     She has no cervical adenopathy.   Neurological: She is alert and oriented to person, place, and time. She displays normal reflexes. No cranial nerve deficit.   Skin: Skin is warm and dry. No rash noted. She is not diaphoretic. No erythema.   Psychiatric: She has a normal mood and affect.   Nursing note and vitals reviewed.       MDM    Procedures    The patient was observed in the ED.      Results Reviewed:      Recent Results (from the past 24 hour(s))   EKG, 12 LEAD, INITIAL    Collection Time: 03/16/14 11:01 AM   Result Value Ref Range    Systolic BP  mmHg     Diastolic  BP  mmHg    Ventricular Rate 91 BPM    Atrial Rate 91 BPM    P-R Interval 162 ms    QRS Duration 92 ms    Q-T Interval 364 ms    QTC Calculation (Bezet) 447 ms    Calculated P Axis 71 degrees    Calculated R Axis 88 degrees    Calculated T Axis 59 degrees    Diagnosis       Normal sinus rhythm  Normal ECG  When compared with ECG of 04-Sep-2013 12:05,  No significant change was found  Confirmed by MCCOTTER  MD (UC), CRAIG J ([id number] on 03/16/2014 3:55:97 PM     METABOLIC PANEL, COMPREHENSIVE    Collection Time: 03/16/14 12:06 PM   Result Value Ref Range    Sodium 140 136 - 145 mmol/L    Potassium 4.0 3.5 - 5.1 mmol/L    Chloride 105 98 - 107 mmol/L    CO2 23 21 - 32 mmol/L    Anion gap 12 7 - 16 mmol/L    Glucose 102 (H) 65 - 100 mg/dL    BUN 9 6 - 23 MG/DL    Creatinine 0.88 0.6 - 1.0 MG/DL    GFR est AA >60 >60 ml/min/1.24m    GFR est non-AA >60 >60 ml/min/1.771m   Calcium 8.9 8.3 - 10.4 MG/DL    Bilirubin, total 0.1 (L) 0.2 - 1.1 MG/DL    ALT 39 12 - 65 U/L    AST 37 15 - 37 U/L    Alk. phosphatase 154 (H) 50 - 136 U/L    Protein, total 7.5 6.3 - 8.2 g/dL    Albumin 3.5 3.5 - 5.0 g/dL    Globulin 4.0 (H) 2.3 - 3.5 g/dL    A-G Ratio 0.9 (L) 1.2 - 3.5     MAGNESIUM    Collection Time: 03/16/14 12:06 PM   Result Value Ref Range    Magnesium 1.8 1.8 - 2.4 mg/dL   CBC WITH AUTOMATED DIFF    Collection Time: 03/16/14  1:15 PM   Result Value Ref Range    WBC 8.0 4.3 - 11.1 K/uL    RBC 3.59 (L) 4.05 - 5.25 M/uL    HGB 10.2 (L) 11.7 - 15.4 g/dL    HCT 32.7 (L) 35.8 - 46.3 %    MCV 91.1 79.6 - 97.8 FL    MCH 28.4 26.1 - 32.9 PG    MCHC 31.2 (L) 31.4 - 35.0 g/dL    RDW 15.0 (H) 11.9 - 14.6 %    PLATELET 261 150 - 450 K/uL    MPV 10.9 10.8 - 14.1 FL    DF AUTOMATED      NEUTROPHILS 70 43 - 78 %    LYMPHOCYTES 16 13 - 44 %    MONOCYTES 5 4.0 - 12.0 %    EOSINOPHILS 9 (H) 0.5 - 7.8 %    BASOPHILS 0 0.0 - 2.0 %    IMMATURE GRANULOCYTES 0.1 0.0 - 5.0 %    ABS. NEUTROPHILS 5.5 1.7 - 8.2 K/UL     ABS. LYMPHOCYTES 1.3 0.5 - 4.6 K/UL    ABS. MONOCYTES 0.4 0.1 - 1.3 K/UL    ABS. EOSINOPHILS 0.7 0.0 - 0.8 K/UL    ABS. BASOPHILS 0.0 0.0 - 0.2 K/UL    ABS. IMM. GRANS. 0.0 0.0 - 0.5 K/UL   URINE MICROSCOPIC    Collection Time: 03/16/14  1:23 PM   Result  Value Ref Range    WBC 0-3 0 /hpf    RBC >100 0 /hpf    Epithelial cells 10-20 0 /hpf    Bacteria TRACE 0 /hpf    Casts 0 0 /lpf    Crystals CA OXALATE 0 /LPF    Mucus TRACE 0 /lpf       XR CHEST PA LAT   Final Result   IMPRESSION: Probable right upper lobe infiltrate           Results Include:    Recent Results (from the past 24 hour(s))   EKG, 12 LEAD, INITIAL    Collection Time: 03/16/14 11:01 AM   Result Value Ref Range    Systolic BP  mmHg    Diastolic BP  mmHg    Ventricular Rate 91 BPM    Atrial Rate 91 BPM    P-R Interval 162 ms    QRS Duration 92 ms    Q-T Interval 364 ms    QTC Calculation (Bezet) 447 ms    Calculated P Axis 71 degrees    Calculated R Axis 88 degrees    Calculated T Axis 59 degrees    Diagnosis       Normal sinus rhythm  Normal ECG  When compared with ECG of 04-Sep-2013 12:05,  No significant change was found  Confirmed by MCCOTTER  MD (UC), CRAIG J ([id number]) on 03/16/2014 0:38:88 PM     METABOLIC PANEL, COMPREHENSIVE    Collection Time: 03/16/14 12:06 PM   Result Value Ref Range    Sodium 140 136 - 145 mmol/L    Potassium 4.0 3.5 - 5.1 mmol/L    Chloride 105 98 - 107 mmol/L    CO2 23 21 - 32 mmol/L    Anion gap 12 7 - 16 mmol/L    Glucose 102 (H) 65 - 100 mg/dL    BUN 9 6 - 23 MG/DL    Creatinine 0.88 0.6 - 1.0 MG/DL    GFR est AA >60 >60 ml/min/1.26m    GFR est non-AA >60 >60 ml/min/1.746m   Calcium 8.9 8.3 - 10.4 MG/DL    Bilirubin, total 0.1 (L) 0.2 - 1.1 MG/DL    ALT 39 12 - 65 U/L    AST 37 15 - 37 U/L    Alk. phosphatase 154 (H) 50 - 136 U/L    Protein, total 7.5 6.3 - 8.2 g/dL    Albumin 3.5 3.5 - 5.0 g/dL    Globulin 4.0 (H) 2.3 - 3.5 g/dL    A-G Ratio 0.9 (L) 1.2 - 3.5     MAGNESIUM    Collection Time: 03/16/14 12:06 PM    Result Value Ref Range    Magnesium 1.8 1.8 - 2.4 mg/dL   TROPONIN I    Collection Time: 03/16/14 12:06 PM   Result Value Ref Range    Troponin-I, Qt. <0.04 0.02 - 0.05 NG/ML   CBC WITH AUTOMATED DIFF    Collection Time: 03/16/14  1:15 PM   Result Value Ref Range    WBC 8.0 4.3 - 11.1 K/uL    RBC 3.59 (L) 4.05 - 5.25 M/uL    HGB 10.2 (L) 11.7 - 15.4 g/dL    HCT 32.7 (L) 35.8 - 46.3 %    MCV 91.1 79.6 - 97.8 FL    MCH 28.4 26.1 - 32.9 PG    MCHC 31.2 (L) 31.4 - 35.0 g/dL    RDW 15.0 (H) 11.9 - 14.6 %  PLATELET 261 150 - 450 K/uL    MPV 10.9 10.8 - 14.1 FL    DF AUTOMATED      NEUTROPHILS 70 43 - 78 %    LYMPHOCYTES 16 13 - 44 %    MONOCYTES 5 4.0 - 12.0 %    EOSINOPHILS 9 (H) 0.5 - 7.8 %    BASOPHILS 0 0.0 - 2.0 %    IMMATURE GRANULOCYTES 0.1 0.0 - 5.0 %    ABS. NEUTROPHILS 5.5 1.7 - 8.2 K/UL    ABS. LYMPHOCYTES 1.3 0.5 - 4.6 K/UL    ABS. MONOCYTES 0.4 0.1 - 1.3 K/UL    ABS. EOSINOPHILS 0.7 0.0 - 0.8 K/UL    ABS. BASOPHILS 0.0 0.0 - 0.2 K/UL    ABS. IMM. GRANS. 0.0 0.0 - 0.5 K/UL   URINE MICROSCOPIC    Collection Time: 03/16/14  1:23 PM   Result Value Ref Range    WBC 0-3 0 /hpf    RBC >100 0 /hpf    Epithelial cells 10-20 0 /hpf    Bacteria TRACE 0 /hpf    Casts 0 0 /lpf    Crystals CA OXALATE 0 /LPF    Mucus TRACE 0 /lpf       Lab is called the patient's PT is greater than 100 and INR is greater than 13.  Patient without any active bleeding at this point.  We'll give some by mouth vitamin K.  We'll speak with her hematologist about rechecked tomorrow.  Given the fact she has IVC filter plus of extremely supratherapeutic INR, I doubt this to be pulmonary embolism.

## 2014-03-16 NOTE — ED Notes (Signed)
Lab to do blood draw. Unable to draw blood from right subclavian port.

## 2014-03-17 LAB — PROTHROMBIN TIME + INR
INR: >6.8 — CR (ref 0.9–1.2)
Prothrombin time: >100 s — ABNORMAL HIGH (ref 8.6–12.2)

## 2014-03-17 MED ORDER — LEVOFLOXACIN 750 MG TAB
750 mg | ORAL_TABLET | Freq: Every day | ORAL | Status: DC
Start: 2014-03-17 — End: 2014-05-14

## 2014-03-17 MED ORDER — OXYCODONE 5 MG TAB
5 mg | ORAL_TABLET | Freq: Four times a day (QID) | ORAL | Status: DC | PRN
Start: 2014-03-17 — End: 2014-05-14

## 2014-03-17 MED ORDER — HEPARIN, PORCINE (PF) 100 UNIT/ML IV SYRINGE
100 unit/mL | INTRAVENOUS | Status: DC | PRN
Start: 2014-03-17 — End: 2014-03-16
  Administered 2014-03-17: 01:00:00

## 2014-03-17 MED ORDER — LEVOFLOXACIN 500 MG TAB
500 mg | ORAL | Status: AC
Start: 2014-03-17 — End: 2014-03-16
  Administered 2014-03-17: 01:00:00 via ORAL

## 2014-03-17 MED ORDER — PHYTONADIONE 5 MG TAB
5 mg | ORAL | Status: AC
Start: 2014-03-17 — End: 2014-03-16
  Administered 2014-03-17: via ORAL

## 2014-03-17 MED ORDER — ALBUTEROL SULFATE 2.5 MG/0.5 ML NEB SOLUTION
2.5 mg/0.5 mL | RESPIRATORY_TRACT | Status: DC
Start: 2014-03-17 — End: 2014-03-16

## 2014-03-17 MED FILL — LEVOFLOXACIN 250 MG TAB: 250 mg | ORAL | Qty: 1

## 2014-03-17 MED FILL — MONOJECT PREFILL ADVANCED (PF) 100 UNIT/ML INTRAVENOUS SYRINGE: 100 unit/mL | INTRAVENOUS | Qty: 3

## 2014-03-17 MED FILL — MEPHYTON 5 MG TABLET: 5 mg | ORAL | Qty: 1

## 2014-03-17 NOTE — Progress Notes (Signed)
[facility] initial outreach - I spoke with [name] and explained to her who I was and what my role is. She did not feel she needed any case management services at this time, she acknowledged she was doing better, has a friend to help her and she did confirm she was going to see Dr. Beather ArbourMalvern on 04/21/14 @ 1:20p.m., for follow up. I did have her write my number down in case she had anything come up before that and encouraged her to call me with any concerns or needs. She did agree to keep my number and she will allow me to check back with her in about two weeks.

## 2014-03-31 DIAGNOSIS — G44039 Episodic paroxysmal hemicrania, not intractable: Secondary | ICD-10-CM

## 2014-03-31 NOTE — ED Provider Notes (Signed)
HPI Comments: 49 year old with multiple prior visits for  Headaches developed headache this morning that has not improved with imitrex.  Reports 2 episodes of vomiting.  Patient states headache is typical for prior migraines behind left eye.    Patient is a 49 y.o. female presenting with headaches. [name]   Headache   This is a recurrent problem. Episode onset: 6 AM. The problem occurs constantly. The problem has not changed since onset.The headache is aggravated by photophobia. The pain is located in the left unilateral region. Quality: like a thunder storm. The pain is moderate. Associated symptoms include nausea and vomiting. Pertinent negatives include no fever, no palpitations, no syncope, no shortness of breath, no weakness and no dizziness. She has tried triptan therapy for the symptoms. The treatment provided no relief.        Past Medical History:   Diagnosis Date   ??? Factor V Leiden mutation (HCC)    ??? Infectious disease 02/2009      Hx MRSA / E.Coli bacteremia, MRSA wound infection 2/2 port   ??? Factor II deficiency (HCC)    ??? Calculus of kidney    ??? Depression    ??? Iron deficiency    ??? Migraines    ??? 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 Bipolar disorder ([facility])    ??? Anxiety    ??? Restless leg syndrome    ??? Hematuria, microscopic        Past Surgical History:   Procedure Laterality Date   ??? Cystoscopy  ???     x 13   ??? Lithotripsy  2006   ??? Hx vascular access  July, 2010     [place]   ??? Hx vascular access Right 2013     pt has current [facility]   ??? Hx appendectomy  ???   ??? Hx tonsillectomy     ??? Hx cesarean section     ??? Hx tubal ligation     ??? Hx ovarian cyst removal       on R         Family History:   Problem Relation Age of Onset   ??? Bleeding Prob Father      Factor V   ??? Cancer Maternal Grandmother      metastatic breast CA.    ??? Ovarian Cancer Mother [id number]     passed away due to ca   ??? Bipolar Disorder Mother    ??? Kidney Disease Brother      kidney stones       History     Social History   ??? Marital Status: DIVORCED     Spouse Name: N/A   ??? Number of Children: N/A   ??? Years of Education: N/A     Occupational History   ??? Not on file.     Social History Main Topics   ??? Smoking status: Never Smoker    ??? Smokeless tobacco: Never Used   ??? Alcohol Use: Yes      Comment: rarely - only drinks 2 times per year   ??? Drug Use: No      Comment: narcotic seeking per hospital hx.   ??? Sexual Activity:     Partners: Male     Birth Control/ Protection: Surgical     Other Topics Concern   ???  Not on file     Social History Narrative           ALLERGIES: Ativan; Codeine; Demerol; Iodinated contrast media - iv dye; Ketorolac tromethamine; Motrin; Pcn; Pneumococcal vaccine; Pneumovax 23; and Shellfish containing products      Review of Systems   Constitutional: Negative for fever and chills.   HENT: Negative for hearing loss.    Eyes: Negative for visual disturbance.   Respiratory: Negative for cough and shortness of breath.    Cardiovascular: Negative for chest pain, palpitations and syncope.   Gastrointestinal: Positive for nausea and vomiting. Negative for abdominal pain and diarrhea.   Genitourinary: Negative for difficulty urinating.   Musculoskeletal: Negative for back pain.   Skin: Negative for rash.   Neurological: Positive for headaches. Negative for dizziness, weakness and light-headedness.   Psychiatric/Behavioral: Negative for confusion.       Filed Vitals:    03/31/14 2028 03/31/14 2212   BP: 129/74 140/72   Pulse: 90    Temp: 98.4 ??F (36.9 ??C)    Resp: 18    Height:  (1.626 m)    Weight: 67.586 kg (149 lb)    SpO2: 97% 96%            Physical Exam   Constitutional: She appears well-developed and well-nourished.   HENT:   Head: Normocephalic and atraumatic.   Right Ear: External ear normal.   Left Ear: External ear normal.    Nose: Nose normal. Right sinus exhibits no maxillary sinus tenderness and no frontal sinus tenderness. Left sinus exhibits no maxillary sinus tenderness and no frontal sinus tenderness.   Mouth/Throat: Oropharynx is clear and moist.   Eyes: Conjunctivae are normal. Pupils are equal, round, and reactive to light.   Neck: Normal range of motion. Neck supple.   Cardiovascular: Regular rhythm, normal heart sounds and intact distal pulses.    Pulmonary/Chest: Effort normal and breath sounds normal. No respiratory distress. She has no wheezes.   Abdominal: Soft. Bowel sounds are normal. She exhibits no distension. There is no tenderness.   Musculoskeletal: Normal range of motion. She exhibits no edema.   Neurological: She is alert. She has normal strength. No cranial nerve deficit or sensory deficit.   Skin: Skin is warm and dry.   Psychiatric: Judgment normal.   Nursing note and vitals reviewed.       MDM  Number of Diagnoses or Management Options     Amount and/or Complexity of Data Reviewed  Tests in the medicine section of CPT??: ordered and reviewed    Risk of Complications, Morbidity, and/or Mortality  Presenting problems: low  Diagnostic procedures: low  Management options: moderate    Patient Progress  Patient progress: stable      Procedures

## 2014-03-31 NOTE — ED Notes (Signed)
Report given to Krista Lopez, Charity fundraiserN. Pt in no acute distress.

## 2014-03-31 NOTE — ED Notes (Signed)
Typical migraine headache.  States woke with headache took imitrex without relief.

## 2014-04-01 ENCOUNTER — Inpatient Hospital Stay
Admit: 2014-04-01 | Discharge: 2014-04-01 | Disposition: A | Discharge status: Home / Self Care | Payer: MEDICARE | Attending: Emergency Medicine

## 2014-04-01 MED ORDER — SODIUM CHLORIDE 0.9 % IJ SYRG
Freq: Three times a day (TID) | INTRAMUSCULAR | Status: DC
Start: 2014-04-01 — End: 2014-04-01

## 2014-04-01 MED ORDER — HEPARIN, PORCINE (PF) 100 UNIT/ML IV SYRINGE
100 unit/mL | INTRAVENOUS | Status: DC | PRN
Start: 2014-04-01 — End: 2014-04-01
  Administered 2014-04-01: 05:00:00

## 2014-04-01 MED ORDER — NALBUPHINE 10 MG/ML INJECTION
10 mg/mL | INTRAMUSCULAR | Status: AC
Start: 2014-04-01 — End: 2014-03-31
  Administered 2014-04-01: 04:00:00 via INTRAVENOUS

## 2014-04-01 MED ORDER — SODIUM CHLORIDE 0.9 % IJ SYRG
INTRAMUSCULAR | Status: DC | PRN
Start: 2014-04-01 — End: 2014-04-01

## 2014-04-01 MED ORDER — PROCHLORPERAZINE EDISYLATE 5 MG/ML INJECTION
5 mg/mL | INTRAMUSCULAR | Status: AC
Start: 2014-04-01 — End: 2014-03-31
  Administered 2014-04-01: 04:00:00 via INTRAVENOUS

## 2014-04-01 MED ORDER — DIPHENHYDRAMINE HCL 50 MG/ML IJ SOLN
50 mg/mL | INTRAMUSCULAR | Status: AC
Start: 2014-04-01 — End: 2014-03-31
  Administered 2014-04-01: 04:00:00 via INTRAVENOUS

## 2014-04-01 MED FILL — PROCHLORPERAZINE EDISYLATE 5 MG/ML INJECTION: 5 mg/mL | INTRAMUSCULAR | Qty: 2

## 2014-04-01 MED FILL — NALBUPHINE 10 MG/ML INJECTION: 10 mg/mL | INTRAMUSCULAR | Qty: 1

## 2014-04-01 MED FILL — MONOJECT PREFILL ADVANCED (PF) 100 UNIT/ML INTRAVENOUS SYRINGE: 100 unit/mL | INTRAVENOUS | Qty: 3

## 2014-04-01 MED FILL — DIPHENHYDRAMINE HCL 50 MG/ML IJ SOLN: 50 mg/mL | INTRAMUSCULAR | Qty: 1

## 2014-04-21 ENCOUNTER — Encounter: Attending: Internal Medicine | Primary: Student in an Organized Health Care Education/Training Program

## 2014-04-25 NOTE — Progress Notes (Signed)
[facility] follow up call - there was no answer, left a message with my direct contact number. Looks like she may not have attended her 04/21/14 appointment she had scheduled with Dr. Beather ArbourMalvern, I do not see a note from that date in the record.

## 2014-04-27 ENCOUNTER — Emergency Department: Payer: MEDICARE | Primary: Student in an Organized Health Care Education/Training Program

## 2014-04-27 ENCOUNTER — Inpatient Hospital Stay
Admit: 2014-04-27 | Discharge: 2014-04-27 | Discharge status: Against Medical Advice | Payer: MEDICARE | Attending: Emergency Medicine

## 2014-04-27 DIAGNOSIS — R079 Chest pain, unspecified: Secondary | ICD-10-CM

## 2014-04-27 MED ORDER — HYDROCODONE-ACETAMINOPHEN 5 MG-325 MG TAB
5-325 mg | ORAL | Status: AC
Start: 2014-04-27 — End: 2014-04-27
  Administered 2014-04-27: 18:00:00 via ORAL

## 2014-04-27 MED ORDER — ONDANSETRON 8 MG TAB, RAPID DISSOLVE
8 mg | ORAL | Status: AC
Start: 2014-04-27 — End: 2014-04-27
  Administered 2014-04-27: 18:00:00 via ORAL

## 2014-04-27 MED FILL — ONDANSETRON 8 MG TAB, RAPID DISSOLVE: 8 mg | ORAL | Qty: 1

## 2014-04-27 MED FILL — HYDROCODONE-ACETAMINOPHEN 5 MG-325 MG TAB: 5-325 mg | ORAL | Qty: 1

## 2014-04-27 NOTE — ED Notes (Signed)
Josh from [facility] came to get pt, unable to locate pt

## 2014-04-27 NOTE — ED Notes (Signed)
States hurts center chest through to left shoulder

## 2014-04-27 NOTE — Progress Notes (Signed)
Visited with patient.  She states her friend got killed in a car wreck the night before the appointment and she didn't think to call and cancel.  Would like for appointment to be rescheduled.  [facility] and spoke with Dwain SarnaLashon. Made patient appointment for May 4th @ 1:20. Information written down and communicated with patient.  [facility]  Patient confused at first and then states that 'they' were going to send in a home health nurse and she thought that was what it was.  States she is open to case management after discharge. [facility] notify of above.

## 2014-04-27 NOTE — ED Provider Notes (Addendum)
HPI Comments: 49 y/o f with hsx multiple dvt/pe who has been off her coumadin for 9 days, not taking lovenox that was prescribed as her dentist wanted her off all blood thinners, present to ed with sob, left chest pain that is sharp in nature, worse with inspiration, began when walking her dog this am.  No hemoptysis or sputum production.  Admits right lower ext edema and pain ongoing this past week.  No fever or chills, no n/v/d,  Admits cough but is non productive.  Says "i think i have another blood clot".  Has had 14 and greenfield filter is in place.      Patient is a 49 y.o. female presenting with chest pain. [name] No language interpreter was used.   Chest Pain (Angina)   This is a new problem. The current episode started 3 to 5 hours ago. The problem has not changed since onset.The problem occurs constantly. The pain is associated with breathing. The pain is present in the left side. The pain is moderate. The quality of the pain is described as sharp and stabbing. The pain radiates to the left arm. Associated symptoms include back pain, cough and shortness of breath. Pertinent negatives include no abdominal pain, no claudication, no diaphoresis, no dizziness, no exertional chest pressure, no fever, no headaches, no irregular heartbeat, no leg pain, no malaise/fatigue, no nausea, no near-syncope, no orthopnea, no palpitations, no vomiting and no weakness. Her past medical history is significant for DVT and PE.       Past Medical History:   Diagnosis Date   ??? Factor V Leiden mutation (HCC)    ??? Infectious disease 02/2009      Hx MRSA / E.Coli bacteremia, MRSA wound infection 2/2 port   ??? Factor II deficiency (HCC)    ??? Calculus of kidney    ??? Depression    ??? Iron deficiency    ??? Migraines    ??? 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 Bipolar disorder (HCC)    ??? Anxiety    ??? Restless leg syndrome     ??? Hematuria, microscopic        Past Surgical History:   Procedure Laterality Date   ??? Cystoscopy  ???     x 13   ??? Lithotripsy  2006   ??? Hx vascular access  July, 2010     Power Port placed in [place], [place] removed   ??? Hx vascular access Right 2013     pt has current rt subclavian port - placed at [facility]   ??? Hx appendectomy  ???   ??? Hx tonsillectomy     ??? Hx cesarean section     ??? Hx tubal ligation     ??? Hx ovarian cyst removal       on R         Family History:   Problem Relation Age of Onset   ??? Bleeding Prob Father      Factor V   ??? Cancer Maternal Grandmother      metastatic breast CA.   ??? Ovarian Cancer Mother 43     passed away due to ca   ??? Bipolar Disorder Mother    ??? Kidney Disease Brother      kidney stones       History     Social History   ??? Marital Status: DIVORCED  Spouse Name: N/A   ??? Number of Children: N/A   ??? Years of Education: N/A     Occupational History   ??? Not on file.     Social History Main Topics   ??? Smoking status: Never Smoker    ??? Smokeless tobacco: Never Used   ??? Alcohol Use: Yes      Comment: rarely - only drinks 2 times per year   ??? Drug Use: No      Comment: narcotic seeking per hospital hx.   ??? Sexual Activity:     Partners: Male     Birth Control/ Protection: Surgical     Other Topics Concern   ??? Not on file     Social History Narrative           ALLERGIES: Ativan; Codeine; Demerol; Iodinated contrast media - iv dye; Ketorolac tromethamine; Motrin; Pcn; Pneumococcal vaccine; Pneumovax 23; and Shellfish containing products      Review of Systems   Constitutional: Negative for fever, chills, malaise/fatigue and diaphoresis.   HENT: Negative for facial swelling and nosebleeds.    Eyes: Negative for discharge and redness.   Respiratory: Positive for cough and shortness of breath. Negative for choking and wheezing.    Cardiovascular: Positive for chest pain. Negative for palpitations, orthopnea, claudication and near-syncope.    Gastrointestinal: Negative for nausea, vomiting and abdominal pain.   Endocrine: Negative for cold intolerance and heat intolerance.   Genitourinary: Negative for dysuria and difficulty urinating.   Musculoskeletal: Positive for back pain. Negative for neck pain and neck stiffness.   Skin: Negative for rash and wound.   Neurological: Negative for dizziness, weakness and headaches.   Psychiatric/Behavioral: Negative for confusion and decreased concentration.       Filed Vitals:    04/27/14 1227   BP: 147/74   Pulse: 80   Temp: 97.9 ??F (36.6 ??C)   Resp: 16   Height: 5\' 4"  (1.626 m)   Weight: 68.493 kg (151 lb)   SpO2: 97%            Physical Exam   Constitutional: She is oriented to person, place, and time. She appears well-developed and well-nourished. No distress.   HENT:   Head: Normocephalic and atraumatic.   Right Ear: External ear normal.   Left Ear: External ear normal.   Nose: Nose normal.   Eyes: Conjunctivae and EOM are normal. Pupils are equal, round, and reactive to light.   Neck: Normal range of motion. Neck supple.   Cardiovascular: Normal rate, regular rhythm and normal heart sounds.    Pulmonary/Chest: Effort normal and breath sounds normal. No respiratory distress. She has no wheezes.           Abdominal: Soft. Bowel sounds are normal. She exhibits no distension. There is no tenderness.   Musculoskeletal: Normal range of motion. She exhibits no edema or tenderness.   Neurological: She is alert and oriented to person, place, and time. No cranial nerve deficit. Coordination normal.   Skin: Skin is warm and dry. No rash noted.   Psychiatric: She has a normal mood and affect. Her behavior is normal. Judgment and thought content normal.   Nursing note and vitals reviewed.       MDM  Number of Diagnoses or Management Options  Diagnosis management comments: 49 y/o with hsx multiple dvt/pe presents with left side chest pain sudden onset this am.  Has been off coumadin for  9 days for dental work.  Will eval  vq scan and inr.  Her sat is 97% on room air  3:10 PM late note, pt earlier refused blood work to [employer].  Now she is not in room for further exam or vq scan................................she did however receive her pain meds earlier.................................Marland Kitchen       Amount and/or Complexity of Data Reviewed  Clinical lab tests: ordered  Tests in the radiology section of CPT??: ordered  Discuss the patient with other providers: yes (wetenhall  )        Procedures

## 2014-04-27 NOTE — ED Notes (Signed)
Attempted to draw blood, pt states she does not bleed and would have to access her port to get blood, informed the risk of infection for accessing blood only. Notified Aram Beechamynthia NP no labs drawn

## 2014-04-27 NOTE — ED Notes (Signed)
Pt left without completing treatment

## 2014-04-27 NOTE — ED Notes (Addendum)
Room empty

## 2014-04-28 LAB — EKG, 12 LEAD, INITIAL
Atrial Rate: 86 {beats}/min
Calculated P Axis: 78 degrees
Calculated R Axis: 94 degrees
Calculated T Axis: 63 degrees
Diagnosis: NORMAL
P-R Interval: 158 ms
Q-T Interval: 360 ms
QRS Duration: 74 ms
QTC Calculation (Bezet): 430 ms
Ventricular Rate: 86 {beats}/min

## 2014-05-11 NOTE — Progress Notes (Signed)
MSSP call to Krista Lopez, no answer, left a voice message and my contact number.

## 2014-05-14 ENCOUNTER — Inpatient Hospital Stay
Admit: 2014-05-14 | Discharge: 2014-05-14 | Disposition: A | Discharge status: Home / Self Care | Payer: MEDICARE | Attending: Emergency Medicine

## 2014-05-14 ENCOUNTER — Emergency Department: Admit: 2014-05-14 | Payer: MEDICARE | Primary: Student in an Organized Health Care Education/Training Program

## 2014-05-14 DIAGNOSIS — N200 Calculus of kidney: Secondary | ICD-10-CM

## 2014-05-14 LAB — CBC WITH AUTOMATED DIFF
ABS. BASOPHILS: 0 10*3/uL (ref 0.0–0.2)
ABS. EOSINOPHILS: 0.7 10*3/uL (ref 0.0–0.8)
ABS. IMM. GRANS.: 0 10*3/uL (ref 0.0–0.5)
ABS. LYMPHOCYTES: 1.8 10*3/uL (ref 0.5–4.6)
ABS. MONOCYTES: 0.5 10*3/uL (ref 0.1–1.3)
ABS. NEUTROPHILS: 2.4 10*3/uL (ref 1.7–8.2)
BASOPHILS: 1 % (ref 0.0–2.0)
EOSINOPHILS: 14 % — ABNORMAL HIGH (ref 0.5–7.8)
HCT: 37.8 % (ref 35.8–46.3)
HGB: 12 g/dL (ref 11.7–15.4)
IMMATURE GRANULOCYTES: 0 % (ref 0.0–5.0)
LYMPHOCYTES: 33 % (ref 13–44)
MCH: 27.5 PG (ref 26.1–32.9)
MCHC: 31.7 g/dL (ref 31.4–35.0)
MCV: 86.7 FL (ref 79.6–97.8)
MONOCYTES: 9 % (ref 4.0–12.0)
MPV: 10.8 FL (ref 10.8–14.1)
NEUTROPHILS: 43 % (ref 43–78)
PLATELET: 315 10*3/uL (ref 150–450)
RBC: 4.36 M/uL (ref 4.05–5.25)
RDW: 15.1 % — ABNORMAL HIGH (ref 11.9–14.6)
WBC: 5.5 10*3/uL (ref 4.3–11.1)

## 2014-05-14 LAB — METABOLIC PANEL, BASIC
Anion gap: 11 mmol/L (ref 7–16)
BUN: 16 MG/DL (ref 6–23)
CO2: 23 mmol/L (ref 21–32)
Calcium: 9.7 MG/DL (ref 8.3–10.4)
Chloride: 102 mmol/L (ref 98–107)
Creatinine: 0.67 MG/DL (ref 0.6–1.0)
GFR est AA: >60 mL/min/{1.73_m2} (ref >60)
GFR est non-AA: >60 mL/min/{1.73_m2} (ref >60)
Glucose: 102 mg/dL — ABNORMAL HIGH (ref 65–100)
Potassium: 4.2 mmol/L (ref 3.5–5.1)
Sodium: 136 mmol/L (ref 136–145)

## 2014-05-14 LAB — URINE MICROSCOPIC: RBC: >100 /hpf — ABNORMAL HIGH

## 2014-05-14 MED ORDER — HEPARIN, PORCINE (PF) 100 UNIT/ML IV SYRINGE
100 unit/mL | INTRAVENOUS | Status: DC | PRN
Start: 2014-05-14 — End: 2014-05-14

## 2014-05-14 MED ORDER — MORPHINE 4 MG/ML SYRINGE
4 mg/mL | INTRAMUSCULAR | Status: AC
Start: 2014-05-14 — End: 2014-05-14
  Administered 2014-05-14: 20:00:00 via INTRAVENOUS

## 2014-05-14 MED ORDER — DIPHENHYDRAMINE HCL 50 MG/ML IJ SOLN
50 mg/mL | INTRAMUSCULAR | Status: AC
Start: 2014-05-14 — End: 2014-05-14
  Administered 2014-05-14: 20:00:00 via INTRAVENOUS

## 2014-05-14 MED ORDER — MORPHINE 4 MG/ML SYRINGE
4 mg/mL | INTRAMUSCULAR | Status: AC
Start: 2014-05-14 — End: 2014-05-14
  Administered 2014-05-14: 21:00:00 via INTRAVENOUS

## 2014-05-14 MED ORDER — SODIUM CHLORIDE 0.9% BOLUS IV
0.9 % | Freq: Once | INTRAVENOUS | Status: AC
Start: 2014-05-14 — End: 2014-05-14
  Administered 2014-05-14: 20:00:00 via INTRAVENOUS

## 2014-05-14 MED ORDER — HEPARIN, PORCINE (PF) 100 UNIT/ML IV SYRINGE
100 unit/mL | INTRAVENOUS | Status: DC | PRN
Start: 2014-05-14 — End: 2014-05-14
  Administered 2014-05-14: 21:00:00

## 2014-05-14 MED ORDER — TAMSULOSIN SR 0.4 MG 24 HR CAP
0.4 mg | ORAL_CAPSULE | Freq: Every day | ORAL | Status: AC
Start: 2014-05-14 — End: 2014-05-19

## 2014-05-14 MED ORDER — ONDANSETRON (PF) 4 MG/2 ML INJECTION
4 mg/2 mL | INTRAMUSCULAR | Status: AC
Start: 2014-05-14 — End: 2014-05-14
  Administered 2014-05-14: 20:00:00 via INTRAVENOUS

## 2014-05-14 MED ORDER — ONDANSETRON 8 MG TAB, RAPID DISSOLVE
8 mg | ORAL_TABLET | Freq: Two times a day (BID) | ORAL | Status: DC | PRN
Start: 2014-05-14 — End: 2015-05-18

## 2014-05-14 MED ORDER — OXYCODONE-ACETAMINOPHEN 7.5 MG-325 MG TAB
ORAL_TABLET | ORAL | Status: DC | PRN
Start: 2014-05-14 — End: 2014-05-29

## 2014-05-14 MED FILL — MORPHINE 4 MG/ML SYRINGE: 4 mg/mL | INTRAMUSCULAR | Qty: 1

## 2014-05-14 MED FILL — DIPHENHYDRAMINE HCL 50 MG/ML IJ SOLN: 50 mg/mL | INTRAMUSCULAR | Qty: 1

## 2014-05-14 MED FILL — ONDANSETRON (PF) 4 MG/2 ML INJECTION: 4 mg/2 mL | INTRAMUSCULAR | Qty: 2

## 2014-05-14 MED FILL — MONOJECT PREFILL ADVANCED (PF) 100 UNIT/ML INTRAVENOUS SYRINGE: 100 unit/mL | INTRAVENOUS | Qty: 3

## 2014-05-14 NOTE — ED Notes (Signed)
Per implanted port protocol, Heparin 300units ordered to pack port before de-access.

## 2014-05-14 NOTE — ED Notes (Signed)
I have reviewed medications, follow up provider options, and discharge instructions with the patient. The patient verbalized understanding. Copy of discharge information given to patient upon discharge. Prescription(s) given to patient. Patient discharged in no distress. Patient instructed not to drive while under influence of Percocet.

## 2014-05-14 NOTE — ED Notes (Signed)
Patient medicated for pain.

## 2014-05-14 NOTE — ED Notes (Signed)
Patient to ultrasound via wc with tech 

## 2014-05-14 NOTE — Telephone Encounter (Signed)
Went to ER, RUS shows 1.1 cm stone right mid ureter, has held Coumadin for several days.  Needs appt with me or NP on Monday 05-15-14

## 2014-05-14 NOTE — ED Notes (Signed)
Patient states her urologist is Dr. Nona Dellonald Smith.

## 2014-05-14 NOTE — ED Provider Notes (Signed)
HPI Comments: 49 year old white female with history of kidney stones presents with right-sided flank pain since yesterday.  She has had nausea and vomiting this morning.  No fever.  No urinary changes.  pain is not aggravated by movement.  She denies injuries.    Patient is a 49 y.o. female presenting with flank pain. [name]   Flank Pain   Pertinent negatives include no fever, no headaches, no abdominal pain and no dysuria.        Past Medical History:   Diagnosis Date   ??? Factor V Leiden mutation (HCC)    ??? Infectious disease 02/2009      Hx MRSA / E.Coli bacteremia, MRSA wound infection 2/2 port   ??? Factor II deficiency (HCC)    ??? Calculus of kidney    ??? Depression    ??? Iron deficiency    ??? Migraines    ??? 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 Bipolar disorder (HCC)    ??? Anxiety    ??? Restless leg syndrome    ??? Hematuria, microscopic        Past Surgical History:   Procedure Laterality Date   ??? Cystoscopy  ???     x 13   ??? Lithotripsy  2006   ??? Hx vascular access  July, 2010     [place]   ??? Hx vascular access Right 2013     pt has current [facility]   ??? Hx appendectomy  ???   ??? Hx tonsillectomy     ??? Hx cesarean section     ??? Hx tubal ligation     ??? Hx ovarian cyst removal       on R         Family History:   Problem Relation Age of Onset   ??? Bleeding Prob Father      Factor V   ??? Cancer Maternal Grandmother      metastatic breast CA.   ??? Ovarian Cancer Mother [id number]     passed away due to ca   ??? Bipolar Disorder Mother    ??? Kidney Disease Brother      kidney stones       History     Social History   ??? Marital Status: DIVORCED     Spouse Name: N/A   ??? Number of Children: N/A   ??? Years of Education: N/A     Occupational History   ??? Not on file.     Social History Main Topics   ??? Smoking status: Never Smoker    ??? Smokeless tobacco: Never Used    ??? Alcohol Use: Yes      Comment: rarely - only drinks 2 times per year   ??? Drug Use: No      Comment: narcotic seeking per hospital hx.   ??? Sexual Activity:     Partners: Male     Birth Control/ Protection: Surgical     Other Topics Concern   ??? Not on file     Social History Narrative           ALLERGIES: Ativan; Codeine; Demerol; Iodinated contrast media - iv dye; Ketorolac tromethamine; Motrin; Pcn; Pneumococcal vaccine; Pneumovax 23; and Shellfish containing products      Review of Systems   Constitutional: Negative for fever.  HENT: Negative for congestion.    Respiratory: Negative for cough.    Gastrointestinal: Positive for nausea and vomiting. Negative for abdominal pain.   Genitourinary: Positive for flank pain. Negative for dysuria and hematuria.   Musculoskeletal: Negative for back pain and neck pain.   Skin: Negative for rash.   Neurological: Negative for headaches.       Filed Vitals:    05/14/14 1245   BP: 150/79   Pulse: 116   Temp: 98.3 ??F (36.8 ??C)   Resp: 22   Height: 5' 4.5" (1.638 m)   Weight: 68.493 kg (151 lb)   SpO2: 98%            Physical Exam   Constitutional: She is oriented to person, place, and time. She appears well-developed and well-nourished. No distress.   HENT:   Head: Normocephalic and atraumatic.   Mouth/Throat: Oropharynx is clear and moist.   Eyes: Conjunctivae are normal. Pupils are equal, round, and reactive to light.   Neck: Normal range of motion. Neck supple.   Cardiovascular: Normal rate and regular rhythm.    No murmur heard.  Pulmonary/Chest: Effort normal and breath sounds normal.   Abdominal: Soft. She exhibits no mass. There is no tenderness.   Musculoskeletal: Normal range of motion. She exhibits no edema.   Neurological: She is alert and oriented to person, place, and time.   Skin: Skin is warm and dry.   Psychiatric: She has a normal mood and affect.   Nursing note and vitals reviewed.       MDM  Number of Diagnoses or Management Options   Diagnosis management comments: Lab work is unremarkable.  Urinalysis shows blood but no infection.  Patient has had numerous CT scans therefore ultrasound was performed to assess for signs of kidney stone.  She was noted have right-sided hydronephrosis and suspected 1.1 cm mid ureteral stone.  Patient is feeling better at this time and appears safe for outpatient management.  She will be referred to urology as she will likely need assistance with passing of the stone.       Amount and/or Complexity of Data Reviewed  Clinical lab tests: ordered and reviewed  Tests in the radiology section of CPT??: ordered and reviewed  Tests in the medicine section of CPT??: reviewed and ordered  Discuss the patient with other providers: yes  Independent visualization of images, tracings, or specimens: yes    Risk of Complications, Morbidity, and/or Mortality  Presenting problems: moderate  Diagnostic procedures: moderate  Management options: moderate        Procedures

## 2014-05-14 NOTE — ED Notes (Signed)
Reports onset of right flank pain yesterday, worse today.

## 2014-05-14 NOTE — ED Notes (Signed)
Patient calling a yellow cab to come pick her up.

## 2014-05-14 NOTE — ED Notes (Signed)
Per implanted port protocol, patient's port flushed with 10cc sterile saline and packed with Heparin 300 units.  Port de-accessed, band-aid applied.

## 2014-05-14 NOTE — ED Notes (Signed)
Patient crying in pain.  Patient states pain has returned.  Dr. Teofilo PodKaczmarek notified. Verbal order received for Morphine 4mg  IV.

## 2014-05-15 ENCOUNTER — Ambulatory Visit
Admit: 2014-05-15 | Discharge: 2014-05-15 | Payer: MEDICARE | Attending: Women's Health | Primary: Student in an Organized Health Care Education/Training Program

## 2014-05-15 ENCOUNTER — Inpatient Hospital Stay: Payer: MEDICARE

## 2014-05-15 DIAGNOSIS — N201 Calculus of ureter: Secondary | ICD-10-CM

## 2014-05-15 LAB — AMB POC URINALYSIS DIP STICK AUTO W/ MICRO (PGU)
Bilirubin (UA POC): NEGATIVE
Glucose (UA POC): NEGATIVE mg/dL
Ketones (UA POC): NEGATIVE
Nitrites (UA POC): NEGATIVE
Specific gravity (UA POC): 1.025 (ref 1.001–1.035)
Urobilinogen (POC): 0.2
pH (UA POC): 6 (ref 4.6–8.0)

## 2014-05-15 LAB — METABOLIC PANEL, BASIC
Anion gap: 7 mmol/L (ref 7–16)
BUN: 17 MG/DL (ref 6–23)
CO2: 25 mmol/L (ref 21–32)
Calcium: 8.6 MG/DL (ref 8.3–10.4)
Chloride: 106 mmol/L (ref 98–107)
Creatinine: 0.84 MG/DL (ref 0.6–1.0)
GFR est AA: >60 mL/min/{1.73_m2} (ref >60)
GFR est non-AA: >60 mL/min/{1.73_m2} (ref >60)
Glucose: 84 mg/dL (ref 65–100)
Potassium: 4.1 mmol/L (ref 3.5–5.1)
Sodium: 138 mmol/L (ref 136–145)

## 2014-05-15 LAB — POC INR

## 2014-05-15 MED ORDER — DEXAMETHASONE SODIUM PHOSPHATE 4 MG/ML IJ SOLN
4 mg/mL | INTRAMUSCULAR | Status: DC | PRN
Start: 2014-05-15 — End: 2014-05-15
  Administered 2014-05-15: 19:00:00 via INTRAVENOUS

## 2014-05-15 MED ORDER — FENTANYL CITRATE (PF) 50 MCG/ML IJ SOLN
50 mcg/mL | INTRAMUSCULAR | Status: DC | PRN
Start: 2014-05-15 — End: 2014-05-15
  Administered 2014-05-15 (×4): via INTRAVENOUS

## 2014-05-15 MED ORDER — LIDOCAINE (PF) 20 MG/ML (2 %) IJ SOLN
20 mg/mL (2 %) | INTRAMUSCULAR | Status: DC | PRN
Start: 2014-05-15 — End: 2014-05-15
  Administered 2014-05-15: 19:00:00 via INTRAVENOUS

## 2014-05-15 MED ORDER — DIPHENHYDRAMINE HCL 50 MG/ML IJ SOLN
50 mg/mL | Freq: Once | INTRAMUSCULAR | Status: AC
Start: 2014-05-15 — End: 2014-05-15
  Administered 2014-05-15: 17:00:00 via INTRAVENOUS

## 2014-05-15 MED ORDER — PHENAZOPYRIDINE 200 MG TAB
200 mg | ORAL_TABLET | Freq: Three times a day (TID) | ORAL | Status: AC | PRN
Start: 2014-05-15 — End: 2014-05-18

## 2014-05-15 MED ORDER — OXYCODONE-ACETAMINOPHEN 5 MG-325 MG TAB
5-325 mg | ORAL_TABLET | ORAL | Status: DC | PRN
Start: 2014-05-15 — End: 2014-05-29

## 2014-05-15 MED ORDER — CIPROFLOXACIN IN D5W 400 MG/200 ML IV PIGGY BACK
400 mg/200 mL | Freq: Once | INTRAVENOUS | Status: AC
Start: 2014-05-15 — End: 2014-05-15
  Administered 2014-05-15: 19:00:00 via INTRAVENOUS

## 2014-05-15 MED ORDER — PROMETHAZINE 25 MG/ML INJECTION
25 mg/mL | Freq: Once | INTRAMUSCULAR | Status: AC
Start: 2014-05-15 — End: 2014-05-15
  Administered 2014-05-15: 15:00:00 via INTRAMUSCULAR

## 2014-05-15 MED ORDER — HYDROMORPHONE (PF) 1 MG/ML IJ SOLN
1 mg/mL | Freq: Once | INTRAMUSCULAR | Status: AC
Start: 2014-05-15 — End: 2014-05-15
  Administered 2014-05-15: 17:00:00 via INTRAVENOUS

## 2014-05-15 MED ORDER — LACTATED RINGERS IV
INTRAVENOUS | Status: DC
Start: 2014-05-15 — End: 2014-05-15
  Administered 2014-05-15: 17:00:00 via INTRAVENOUS

## 2014-05-15 MED ORDER — ACETAMINOPHEN 500 MG TAB
500 mg | Freq: Once | ORAL | Status: AC
Start: 2014-05-15 — End: 2014-05-15
  Administered 2014-05-15: 18:00:00 via ORAL

## 2014-05-15 MED ORDER — PROPOFOL 10 MG/ML IV EMUL
10 mg/mL | INTRAVENOUS | Status: DC | PRN
Start: 2014-05-15 — End: 2014-05-15
  Administered 2014-05-15: 19:00:00 via INTRAVENOUS

## 2014-05-15 MED ORDER — PROMETHAZINE 25 MG/ML INJECTION
25 mg/mL | Freq: Once | INTRAMUSCULAR | Status: AC
Start: 2014-05-15 — End: 2014-05-15
  Administered 2014-05-15: 16:00:00 via INTRAMUSCULAR

## 2014-05-15 MED ORDER — SODIUM CHLORIDE 0.9 % IV
INTRAVENOUS | Status: DC
Start: 2014-05-15 — End: 2014-05-15

## 2014-05-15 MED ORDER — IOTHALAMATE MEGLUMINE 60 % INJECTION
60 % | INTRAMUSCULAR | Status: AC
Start: 2014-05-15 — End: ?

## 2014-05-15 MED ORDER — MEPERIDINE (PF) 50 MG/ML IJ SOLN
50 mg/mL | Freq: Once | INTRAMUSCULAR | Status: AC
Start: 2014-05-15 — End: 2014-05-15
  Administered 2014-05-15: 16:00:00 via INTRAMUSCULAR

## 2014-05-15 MED ORDER — LACTATED RINGERS IV
INTRAVENOUS | Status: DC
Start: 2014-05-15 — End: 2014-05-15

## 2014-05-15 MED ORDER — BUPIVACAINE (PF) 0.5 % (5 MG/ML) IJ SOLN
0.5 % (5 mg/mL) | INTRAMUSCULAR | Status: DC | PRN
Start: 2014-05-15 — End: 2014-05-15
  Administered 2014-05-15: 20:00:00 via TOPICAL

## 2014-05-15 MED ORDER — OXYCODONE 5 MG TAB
5 mg | ORAL | Status: DC | PRN
Start: 2014-05-15 — End: 2014-05-15

## 2014-05-15 MED ORDER — ONDANSETRON (PF) 4 MG/2 ML INJECTION
4 mg/2 mL | INTRAMUSCULAR | Status: DC | PRN
Start: 2014-05-15 — End: 2014-05-15
  Administered 2014-05-15: 19:00:00 via INTRAVENOUS

## 2014-05-15 MED ORDER — PROMETHAZINE 25 MG/ML INJECTION
25 mg/mL | INTRAMUSCULAR | Status: DC | PRN
Start: 2014-05-15 — End: 2014-05-15
  Administered 2014-05-15 (×2): via INTRAVENOUS

## 2014-05-15 MED ORDER — FAMOTIDINE 20 MG TAB
20 mg | Freq: Once | ORAL | Status: AC
Start: 2014-05-15 — End: 2014-05-15
  Administered 2014-05-15: 18:00:00 via ORAL

## 2014-05-15 MED ORDER — FENTANYL CITRATE (PF) 50 MCG/ML IJ SOLN
50 mcg/mL | Freq: Once | INTRAMUSCULAR | Status: DC
Start: 2014-05-15 — End: 2014-05-15

## 2014-05-15 MED ORDER — MIDAZOLAM 1 MG/ML IJ SOLN
1 mg/mL | Freq: Once | INTRAMUSCULAR | Status: AC
Start: 2014-05-15 — End: 2014-05-15
  Administered 2014-05-15: 18:00:00 via INTRAVENOUS

## 2014-05-15 MED ORDER — SODIUM CHLORIDE 0.9 % IJ SYRG
INTRAMUSCULAR | Status: DC | PRN
Start: 2014-05-15 — End: 2014-05-15

## 2014-05-15 MED ORDER — MEPERIDINE (PF) 50 MG/ML IJ SOLN
50 mg/mL | Freq: Once | INTRAMUSCULAR | Status: AC
Start: 2014-05-15 — End: 2014-05-15
  Administered 2014-05-15: 15:00:00 via INTRAMUSCULAR

## 2014-05-15 MED ORDER — LIDOCAINE HCL 1 % (10 MG/ML) IJ SOLN
10 mg/mL (1 %) | INTRAMUSCULAR | Status: DC | PRN
Start: 2014-05-15 — End: 2014-05-15

## 2014-05-15 MED ORDER — HYDROMORPHONE (PF) 2 MG/ML IJ SOLN
2 mg/mL | INTRAMUSCULAR | Status: DC | PRN
Start: 2014-05-15 — End: 2014-05-15
  Administered 2014-05-15 (×4): via INTRAVENOUS

## 2014-05-15 MED ORDER — HEPARIN, PORCINE (PF) 100 UNIT/ML IV SYRINGE
100 unit/mL | Freq: Once | INTRAVENOUS | Status: AC
Start: 2014-05-15 — End: 2014-05-15
  Administered 2014-05-15: 22:00:00

## 2014-05-15 MED ORDER — MIDAZOLAM 1 MG/ML IJ SOLN
1 mg/mL | Freq: Once | INTRAMUSCULAR | Status: DC
Start: 2014-05-15 — End: 2014-05-15

## 2014-05-15 MED ORDER — BUPIVACAINE (PF) 0.5 % (5 MG/ML) IJ SOLN
0.5 % (5 mg/mL) | INTRAMUSCULAR | Status: AC
Start: 2014-05-15 — End: ?

## 2014-05-15 MED ORDER — FENTANYL CITRATE (PF) 50 MCG/ML IJ SOLN
50 mcg/mL | INTRAMUSCULAR | Status: AC
Start: 2014-05-15 — End: ?

## 2014-05-15 MED ORDER — ACETAMINOPHEN 500 MG TAB
500 mg | Freq: Four times a day (QID) | ORAL | Status: DC | PRN
Start: 2014-05-15 — End: 2014-05-15

## 2014-05-15 MED FILL — MONOJECT PREFILL ADVANCED (PF) 100 UNIT/ML INTRAVENOUS SYRINGE: 100 unit/mL | INTRAVENOUS | Qty: 3

## 2014-05-15 MED FILL — MIDAZOLAM 1 MG/ML IJ SOLN: 1 mg/mL | INTRAMUSCULAR | Qty: 2

## 2014-05-15 MED FILL — MAPAP EXTRA STRENGTH 500 MG TABLET: 500 mg | ORAL | Qty: 2

## 2014-05-15 MED FILL — ONDANSETRON (PF) 4 MG/2 ML INJECTION: 4 mg/2 mL | INTRAMUSCULAR | Qty: 4

## 2014-05-15 MED FILL — DEXAMETHASONE SODIUM PHOSPHATE 10 MG/ML IJ SOLN: 10 mg/mL | INTRAMUSCULAR | Qty: 10

## 2014-05-15 MED FILL — FAMOTIDINE 20 MG TAB: 20 mg | ORAL | Qty: 1

## 2014-05-15 MED FILL — CONRAY 60 % INJECTION SOLUTION: 60 % | INTRAMUSCULAR | Qty: 50

## 2014-05-15 MED FILL — FENTANYL CITRATE (PF) 50 MCG/ML IJ SOLN: 50 mcg/mL | INTRAMUSCULAR | Qty: 2

## 2014-05-15 MED FILL — HYDROMORPHONE (PF) 1 MG/ML IJ SOLN: 1 mg/mL | INTRAMUSCULAR | Qty: 1

## 2014-05-15 MED FILL — LIDOCAINE (PF) 20 MG/ML (2 %) IJ SOLN: 20 mg/mL (2 %) | INTRAMUSCULAR | Qty: 100

## 2014-05-15 MED FILL — SENSORCAINE-MPF 0.5 % (5 MG/ML) INJECTION SOLUTION: 0.5 % (5 mg/mL) | INTRAMUSCULAR | Qty: 30

## 2014-05-15 MED FILL — DIPHENHYDRAMINE HCL 50 MG/ML IJ SOLN: 50 mg/mL | INTRAMUSCULAR | Qty: 1

## 2014-05-15 MED FILL — CIPROFLOXACIN IN D5W 400 MG/200 ML IV PIGGY BACK: 400 mg/200 mL | INTRAVENOUS | Qty: 200

## 2014-05-15 MED FILL — HYDROMORPHONE (PF) 2 MG/ML IJ SOLN: 2 mg/mL | INTRAMUSCULAR | Qty: 1

## 2014-05-15 MED FILL — PROPOFOL 10 MG/ML IV EMUL: 10 mg/mL | INTRAVENOUS | Qty: 170

## 2014-05-15 MED FILL — PROMETHAZINE 25 MG/ML INJECTION: 25 mg/mL | INTRAMUSCULAR | Qty: 1

## 2014-05-15 NOTE — Anesthesia Pre-Procedure Evaluation (Signed)
Anesthetic History               Review of Systems / Medical History  Patient summary reviewed, nursing notes reviewed and pertinent labs reviewed    Pulmonary                   Neuro/Psych         Headaches (Migraines) and psychiatric history (Bipolar disorder)    Comments: RLS Cardiovascular                  Exercise tolerance: >4 METS  Comments: Factor V Leiden mutation; multiple episodes DVT / pulm emboli.  On warfarin.   GI/Hepatic/Renal         Renal disease: stones       Endo/Other             Other Findings   Comments: Opioid dependency           Physical Exam    Airway  Mallampati: I  TM Distance: 4 - 6 cm  Neck ROM: normal range of motion   Mouth opening: Normal     Cardiovascular    Rhythm: regular  Rate: normal         Dental    Dentition: Upper partial plate     Pulmonary  Breath sounds clear to auscultation               Abdominal  GI exam deferred       Other Findings            Anesthetic Plan    ASA: 2  Anesthesia type: general            Anesthetic plan and risks discussed with: Patient

## 2014-05-15 NOTE — Addendum Note (Signed)
Addended by: Kipp LaurenceHANDLER, Alanee Ting on: 05/15/2014 11:41 AM      Modules accepted: Orders

## 2014-05-15 NOTE — Anesthesia Post-Procedure Evaluation (Signed)
Post-Anesthesia Evaluation and Assessment    Patient: Krista Lopez AgeeShannon Otte MRN: [id number]  SSN: [id number]    Date of Birth: 1965/08/04  Age: 49 y.o.  Sex: female       Cardiovascular Function/Vital Signs  Visit Vitals   Item Reading   ??? BP 112/56 mmHg   ??? Pulse 90   ??? Temp 36.6 ??C (97.8 ??F)   ??? Resp 12   ??? Ht 5' 4.5" (1.638 m)   ??? Wt 70.478 kg (155 lb 6 oz)   ??? BMI 26.27 kg/m2   ??? SpO2 93%       Patient is status post general anesthesia for Procedure(s):  CYSTOSCOPY RIGHT URETEROSCOPY, LASER, LITHOTRIPSY AND STENT PLACEMENT.    Nausea/Vomiting: None    Postoperative hydration reviewed and adequate.    Pain:  Pain Scale 1: Numeric (0 - 10) (05/15/14 1705)  Pain Intensity 1: 8 (05/15/14 1705)   Managed    Neurological Status:   Neuro (WDL): Exceptions to WDL (05/15/14 1558)  Neuro  Neurologic State: Drowsy (05/15/14 1558)  LUE Motor Response: Purposeful (05/15/14 1558)  LLE Motor Response: Purposeful (05/15/14 1558)  RUE Motor Response: Purposeful (05/15/14 1558)  RLE Motor Response: Purposeful (05/15/14 1558)   At baseline    Mental Status and Level of Consciousness: Alert and oriented     Pulmonary Status:   O2 Device: Nasal cannula (05/15/14 1653)   Adequate oxygenation and airway patent    Complications related to anesthesia: None    Post-anesthesia assessment completed. No concerns    Signed By: Arlis PortaENNIS E Zackory Pudlo, MD     May 15, 2014

## 2014-05-15 NOTE — Other (Signed)
Pt care transferred to Rebecca Belton, RN

## 2014-05-15 NOTE — Progress Notes (Signed)
 50mg  demerol and 25mg  phenergan given LUOQ IM in office per orders from ValierShannon.

## 2014-05-15 NOTE — Other (Signed)
Patient arrived to preop solo.  Patient states roommate, Lianne MorisBob Cook, at patient's home.  Called Lianne MorisBob Cook [phone]) at home.  Nadine CountsBob states he is available to stay with patient and care for patient 24 hours following surgery.  Patient to be discharged via [employer] [facility], [employer] [phone].  John in PACU notified.

## 2014-05-15 NOTE — Progress Notes (Signed)
[facility]  [address]  [place], [place] [id number]  [phone]    Krista AgeeShannon Genis  DOB: 1965/08/19    Chief Complaint   Patient presents with   ??? Kidney Stone     ER Follow up R ureteral Stone          HPI     Krista AgeeShannon Ebling is a 49 y.o. female who presents to office with c/o severe right back pain, N/V. Denies fever.  [facility] on 05/14/14 for this pain.  05/14/14 Renal ultrasound showed a 1.1 cm stone within the dilated partially visualized mid right ureter.   I believe this stone is visualized on KUB however partially obscured d/t gas/stool.     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      07/21/13 CT urogram showed a 7 mm non-obstructing Right LP renal stone.?? No ureteral stones or obstruction noted.?? This renal stone is NOT a new finding.     Patient has been seen in this practice in the past by several of our physicians.??          Past Medical History   Diagnosis Date   ??? Factor V Leiden mutation (HCC)    ??? Infectious disease 02/2009      Hx MRSA / E.Coli bacteremia, MRSA wound infection 2/2 port   ??? Factor II deficiency (HCC)    ??? Calculus of kidney    ??? Depression    ??? Iron deficiency    ??? Migraines    ??? 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 Bipolar disorder (HCC)    ??? Anxiety    ??? Restless leg syndrome    ??? Hematuria, microscopic      Past Surgical History   Procedure Laterality Date   ??? Cystoscopy  ???     x 13   ??? Lithotripsy  2006   ??? Hx vascular access  July, 2010     [place]   ??? Hx vascular access Right 2013     pt has current [facility]   ??? Hx appendectomy  ???   ??? Hx tonsillectomy     ??? Hx cesarean section     ??? Hx tubal ligation     ??? Hx ovarian cyst removal       on R     Current Outpatient Prescriptions   Medication Sig Dispense Refill    ??? cholecalciferol (VITAMIN D3) 1,000 unit cap Take  by mouth daily.     ??? warfarin (COUMADIN) 5 mg tablet Take 5 mg by mouth daily.     ??? ondansetron (ZOFRAN ODT) 8 mg disintegrating tablet Take 1 Tab by mouth every twelve (12) hours as needed for Nausea. 10 Tab 0   ??? tamsulosin (FLOMAX) 0.4 mg capsule Take 1 Cap by mouth daily for 5 days. 5 Cap 0   ??? oxyCODONE-acetaminophen (PERCOCET) 7.5-325 mg per tablet Take 1 Tab by mouth every four (4) hours as needed for Pain. Max Daily Amount: 6 Tabs. 15 Tab 0   ??? pantoprazole (PROTONIX) 40 mg tablet Take 40 mg by mouth two (2) times a day.     ??? zolpidem (AMBIEN) 10 mg tablet Take  by mouth nightly as needed for Sleep.     ???  butalbital-acetaminophen-caffeine (FIORICET, ESGIC) 50-325-40 mg per tablet Take 1 Tab by mouth every four (4) hours as needed for Pain or Headache. Max Daily Amount: 6 Tabs. 20 Tab 0   ??? SUMAtriptan (IMITREX) 100 mg tablet Take 1 Tab by mouth once as needed for Migraine. 20 Tab 0   ??? DULoxetine (CYMBALTA) 60 mg capsule Take 3 caps every day.  Indications: ANXIETY WITH DEPRESSION (Patient taking differently: 30 mg two (2) times a day. Pt states she take  BID  Indications: ANXIETY WITH DEPRESSION) 90 Cap 0   ??? clonazePAM (KLONOPIN) 1 mg tablet Take 1 mg by mouth three (3) times daily.       Current Facility-Administered Medications   Medication Dose Route Frequency Provider Last Rate Last Dose   ??? [COMPLETED] meperidine (pf) (DEMEROL) 50 mg/mL injection 100 mg  100 mg IntraMUSCular ONCE Tonya L Chandler   50 mg at 05/15/14 1100   ??? [COMPLETED] promethazine (PHENERGAN) injection 50 mg  50 mg IntraMUSCular ONCE Tonya L Chandler   50 mg at 05/15/14 1100     Allergies   Allergen Reactions   ??? Ativan [Lorazepam] Itching   ??? Codeine Rash   ??? Demerol [Meperidine] Itching     *     ??? Hydrocodone-Ibuprofen Unknown (comments)   ??? Iodinated Contrast Media - Iv Dye Rash     States also has swelling, and allergic to shellfish   ??? Ketorolac Tromethamine Rash    ??? Motrin [Ibuprofen] Other (comments)     Causes stomach upset after 3 doses.   ??? Pcn [Penicillins] Hives   ??? Pneumococcal Vaccine Swelling   ??? Pneumovax 23 [Pneumococcal 23-Val Ps Vaccine] Other (comments)     Local reaction   ??? Shellfish Containing Products Rash     History     Social History   ??? Marital Status: DIVORCED     Spouse Name: N/A   ??? Number of Children: N/A   ??? Years of Education: N/A     Occupational History   ??? Not on file.     Social History Main Topics   ??? Smoking status: Never Smoker    ??? Smokeless tobacco: Never Used   ??? Alcohol Use: Yes      Comment: rarely - only drinks 2 times per year   ??? Drug Use: No      Comment: narcotic seeking per hospital hx.   ??? Sexual Activity:     Partners: Male     Birth Control/ Protection: Surgical     Other Topics Concern   ??? Not on file     Social History Narrative     Family History   Problem Relation Age of Onset   ??? Bleeding Prob Father      Factor V   ??? Cancer Maternal Grandmother      metastatic breast CA.   ??? Ovarian Cancer Mother 25     passed away due to ca   ??? Bipolar Disorder Mother    ??? Kidney Disease Brother      kidney stones       Review of Systems  Constitutional:   Negative for fever.  GI: Positive for nausea and abdominal pain.  Genitourinary: Positive for flank pain.  Musculoskeletal: Positive for back pain.      Urinalysis  UA - Dipstick  Results for orders placed or performed in visit on 05/15/14   AMB POC URINALYSIS DIP STICK AUTO W/ MICRO (PGU)     Status:  None   Result Value Ref Range Status    Glucose (UA POC) Negative Negative mg/dL Final    Bilirubin (UA POC) Negative Negative Final    Ketones (UA POC) Negative Negative Final    Specific gravity (UA POC) 1.025 1.001 - 1.035 Final    Blood (UA POC) Large Negative Final    pH (UA POC) 6 4.6 - 8.0 Final    Protein (UA POC) Trace Negative Final    Urobilinogen (POC) 0.2 mg/dL  Final    Nitrites (UA POC) Negative Negative Final    Leukocyte esterase (UA POC) Trace Negative Final        UA - Micro  WBC - 0  RBC - TNTC  Bacteria - 0  Epith - 0    PHYSICAL EXAM    Filed Vitals:    05/15/14 1003   BP: 88/63   Pulse: 98   Temp: 98.2 ??F (36.8 ??C)   TempSrc: Tympanic   Resp: 20   Weight: 155 lb (70.308 kg)       General appearance - alert, well appearing, and in no distress  Mental status - alert, oriented to person, place, and time  Eyes - Bilaterally normal & regular  Nose - normal and patent, no erythema, discharge   Neck - supple, no significant adenopathy  Chest/Lung-  Quiet, even and easy respiratory effort without use of accessory muscles  Abdomen - soft, nontender, nondistended, no masses or organomegaly  Back exam - Significant right CVA tenderness  Neurological - alert, oriented, normal speech, no focal findings or movement disorder noted on gross visual exam  Musculoskeletal - normal gait and station  Extremities - normal full range of motion of all extremities  Skin - normal coloration and turgor, no rashes        PLAN  Demerol  and phenergan 45m IM given while in office today  Discussed plan with Dr Archie Patten today  Treatment options with risks of benefits were discussed with the patient in detail.  Treatment options discussed include watchful waiting in hopes that the stone will pass, ESWL, ureteroscopy with laser lithotripsy. Plan is for cystoscopy, right ureteroscopy, laser lithotripsy, and rightstent placement. Risk include but are not limited to bleeding, infection, ureteral injury requiring repair, risk or anesthesia. Discussed potential complications of pushing stone back into kidney as well as difficulty passing stone fragments. The  Patient understands that if we are unable to remove the stone, we will place a stent and the stone will be treated at a later date. The patient understands that the stent is temporary and must be removed in the office.            Assessment and Plan    ICD-10-CM ICD-9-CM    1. Ureteral stone N20.1 592.1 AMB POC URINALYSIS DIP STICK AUTO W/ MICRO (PGU)       AMB POC XRAY, ABDOMEN; SINGLE ANTEROP      meperidine (pf) (DEMEROL) 50 mg/mL injection 100 mg      promethazine (PHENERGAN) injection 50 mg        Lucky Cowboy, WHNP-BC  Time Spent with patient:  20 Minutes (>50% of this time was spent on counseling this patient about stones)  Supervising Physician: Dr Archie Patten

## 2014-05-15 NOTE — Other (Signed)
Patient develops itching following administration of Dilaudid.  No rash noted.  Dr. Marva PandaUlmer notified.  New order given.  See MAR.

## 2014-05-15 NOTE — Brief Op Note (Signed)
BRIEF OPERATIVE NOTE    Date of Procedure: 05/15/2014   Preoperative Diagnosis: RIGHT URETERAL STONE  Postoperative Diagnosis: RIGHT URETERAL STONE    Procedure(s):  CYSTOSCOPY RIGHT URETEROSCOPY, LASER, LITHOTRIPSY AND STENT PLACEMENT  Surgeon(s) and Role:     * Ivan CroftJames R Monroe Jr., MD - Primary  Anesthesia: General   Estimated Blood Loss: none  Specimens: * No specimens in log *   Findings: see op note   Complications: none  Implants:   Implant Name Type Inv. Item Serial No. Manufacturer Lot No. LRB No. Used Action   Charlyne PetrinSTENT URET FIRM [id number] -- [facility] - [id number]   Charlyne PetrinSTENT URET FIRM [id number] -- [employer]   [facility] [id number] Right 1 Implanted

## 2014-05-15 NOTE — Progress Notes (Signed)
 50mg  demerol and 25phenergan given RUOQ IM per orders from [facility].

## 2014-05-15 NOTE — Other (Signed)
Patient complaints of pain 8/10 to right flank.  Dr. Marva PandaUlmer notified.  Dr. Marva PandaUlmer in to assess patient.  Patient states taken Dilaudid in the past.  New order given.  See MAR.

## 2014-05-15 NOTE — Other (Signed)
Security collected patient's valuables envelope.  Receipt placed in front of patient's chart.  Envelope number [phone].

## 2014-05-15 NOTE — Other (Signed)
PT verbalized understanding of discharge paperwork including reasons to call MD, s/s of infection, diet, follow up, activity, medication (time to take next dose, signs of respiratory depression and interaction with home medication) and wound care. Denies questions. Pt is at hospital alone but has roommate at home. Pt sent home via [employer].   ??

## 2014-05-15 NOTE — Other (Signed)
PT states feeling better.

## 2014-05-15 NOTE — Other (Signed)
Patient states itching improved.

## 2014-05-15 NOTE — Other (Signed)
Patient itching.  Dr. Marva PandaUlmer notified. No new orders given at this time.  Will continue to monitor patient.

## 2014-05-16 NOTE — Addendum Note (Signed)
Addendum  created 05/16/14 1434 by Raj JanusShannon M Kris Burd, CRNA    Modules edited: Anesthesia Flowsheet

## 2014-05-16 NOTE — Op Note (Signed)
[facility]                            [address]                           [place] [id number]                                [phone]                                OPERATIVE REPORT    NAME:  Krista GamblerMisanik, Haivyn D                         MR:  [id number]  LOC:  PACUPACUPL            SEX:  F               ACCT:  [id number]  DOB:  [birth year]            AGE:  48              PT:  S  ADMIT:  05/15/2014          DSCH:  05/15/2014     MSV:        DATE OF SURGERY: 05/15/2014    PREOPERATIVE DIAGNOSIS: Right ureteral calculus.    POSTOPERATIVE DIAGNOSIS: Right ureteral calculus.    PROCEDURE: Right ureteroscopy, holmium laser lithotripsy of ureteral  calculus and placement of right double-J ureteral stent.    SURGEON: Dola ArgyleJames R. Keene BreathMonroe Jr, MD    ANESTHESIA: General.    FINDINGS: A 10 mm calculus located in the right mid ureter at the  inferior border of the right sacroiliac joint.    DESCRIPTION OF PROCEDURE: The patient was given a general anesthetic,  placed in the dorsal lithotomy position. She was prepped and draped in  sterile fashion. Fluoroscopy was performed. No stones or were visualized  by fluoroscopy. A 22-French cystoscope was advanced into the urethra  using a video camera and 30 degree lens. The bladder showed no evidence  of any tumor or stones. Both ureteral orifices appeared normal. A 0.038  floppy-tip guidewire was then passed up the right ureter using  fluoroscopy.    A 6.5-French semi-rigid ureteroscope was passed alongside the guidewire.  A stone was encountered at the inferior aspect of the right mid ureter  just at the area of the pelvic brim on the right.    A 365 micron holmium laser fiber was then used at a power setting of 0.5  joules, 5 Hz. The stone was fragmented completely. No evidence of any  ureteral trauma. The right ureter was hydronephrotic. I passed the scope  up into the right proximal ureter and no further stones were noted.    The  scope was removed and the guidewire was backloaded into the  cystoscope. A 7-French 24 cm long double-J stent was passed successfully  and left in good position. Suture was left attached to the distal end of  the stent and left protruding from the urethral meatus. We instilled  Marcaine in the bladder. The patient was taken to the recovery room in  stable condition.    PLAN: She is to be discharged  home. Return to the office in approximately  5 days for stent removal.                Ivan CroftJames R Monroe, Jr, MD       A                This is an unverified document unless signed by physician.    TID:  [id number]                                      DT:  05/16/2014 07:10 A  JOB:  [id number]           DOC#:  [id number]           DD:  05/15/2014    cc:   Ivan CroftJames R Monroe, Jr, MD        Shirline FreesJay D Walls, MD

## 2014-05-16 NOTE — Op Note (Signed)
[facility]                            [address]                           [place], [place]. [address]                                [phone]                                OPERATIVE REPORT    NAME:  Krista Lopez, Krista Lopez                         MR:  [id number]  LOC:  PACUPACUPL            SEX:  F               ACCT:  [id number]  DOB:  [birth year]            AGE:  48              PT:  S  ADMIT:  05/15/2014          DSCH:  05/15/2014     MSV:        DATE OF SURGERY: 05/15/2014    PREOPERATIVE DIAGNOSIS: Right ureteral calculus.    POSTOPERATIVE DIAGNOSIS: Right ureteral calculus.    PROCEDURE: Right ureteroscopy, holmium laser lithotripsy of ureteral  calculus and placement of right double-J ureteral stent.    SURGEON: Dola Argyle. Keene Breath, MD    ANESTHESIA: General.    FINDINGS: A 10 mm calculus located in the right mid ureter at the  inferior border of the right sacroiliac joint.    DESCRIPTION OF PROCEDURE: The patient was given a general anesthetic,  placed in the dorsal lithotomy position. She was prepped and draped in  sterile fashion. Fluoroscopy was performed. No stones or were visualized  by fluoroscopy. A 22-French cystoscope was advanced into the urethra  using a video camera and 30 degree lens. The bladder showed no evidence  of any tumor or stones. Both ureteral orifices appeared normal. A 0.038  floppy-tip guidewire was then passed up the right ureter using  fluoroscopy.    A 6.5-French semi-rigid ureteroscope was passed alongside the guidewire.  A stone was encountered at the inferior aspect of the right mid ureter  just at the area of the pelvic brim on the right.    A 365 micron holmium laser fiber was then used at a power setting of 0.5  joules, 5 Hz. The stone was fragmented completely. No evidence of any  ureteral trauma. The right ureter was hydronephrotic. I passed the scope  up into the right proximal ureter and no further stones were noted.     The scope was removed and the guidewire was backloaded into the  cystoscope. A 7-French 24 cm long double-J stent was passed successfully  and left in good position. Suture was left attached to the distal end of  the stent and left protruding from the urethral meatus. We instilled  Marcaine in the bladder. The patient was taken to the recovery room in  stable condition.    PLAN: She is to be discharged  home. Return to the office in approximately  5 days for stent removal.                Ivan CroftJames R Monroe, Jr, MD       A                This is an unverified document unless signed by physician.    TID:  [id number]                                      DT:  05/16/2014 07:10 A  JOB:  [id number]           DOC#:  [id number]           DD:  05/15/2014    cc:   Ivan CroftJames R Monroe, Jr, MD        Shirline FreesJay D Walls, MD

## 2014-05-19 ENCOUNTER — Ambulatory Visit
Admit: 2014-05-19 | Discharge: 2014-05-19 | Payer: MEDICARE | Attending: Family | Primary: Student in an Organized Health Care Education/Training Program

## 2014-05-19 ENCOUNTER — Inpatient Hospital Stay: Admit: 2014-05-20 | Payer: MEDICARE | Primary: Student in an Organized Health Care Education/Training Program

## 2014-05-19 ENCOUNTER — Inpatient Hospital Stay
Admit: 2014-05-19 | Discharge: 2014-05-19 | Disposition: A | Discharge status: Hospice | Payer: MEDICARE | Attending: Emergency Medicine

## 2014-05-19 ENCOUNTER — Inpatient Hospital Stay
Admit: 2014-05-19 | Discharge: 2014-05-23 | Disposition: A | Discharge status: Home / Self Care | Payer: MEDICARE | Source: Other Acute Inpatient Hospital | Attending: Urology | Admitting: Urology

## 2014-05-19 DIAGNOSIS — R109 Unspecified abdominal pain: Secondary | ICD-10-CM

## 2014-05-19 DIAGNOSIS — N1330 Unspecified hydronephrosis: Secondary | ICD-10-CM

## 2014-05-19 DIAGNOSIS — N200 Calculus of kidney: Secondary | ICD-10-CM

## 2014-05-19 LAB — METABOLIC PANEL, BASIC
Anion gap: 14 mmol/L (ref 7–16)
BUN: 13 MG/DL (ref 6–23)
CO2: 21 mmol/L (ref 21–32)
Calcium: 8.2 MG/DL — ABNORMAL LOW (ref 8.3–10.4)
Chloride: 107 mmol/L (ref 98–107)
Creatinine: 0.96 MG/DL (ref 0.6–1.0)
GFR est AA: >60 mL/min/{1.73_m2} (ref >60)
GFR est non-AA: >60 mL/min/{1.73_m2} (ref >60)
Glucose: 136 mg/dL — ABNORMAL HIGH (ref 65–100)
Potassium: 3.6 mmol/L (ref 3.5–5.1)
Sodium: 142 mmol/L (ref 136–145)

## 2014-05-19 LAB — CBC WITH AUTOMATED DIFF
ABS. BASOPHILS: 0 10*3/uL (ref 0.0–0.2)
ABS. EOSINOPHILS: 0.2 10*3/uL (ref 0.0–0.8)
ABS. IMM. GRANS.: 0 10*3/uL (ref 0.0–0.5)
ABS. LYMPHOCYTES: 0.9 10*3/uL (ref 0.5–4.6)
ABS. MONOCYTES: 1 10*3/uL (ref 0.1–1.3)
ABS. NEUTROPHILS: 8.6 10*3/uL — ABNORMAL HIGH (ref 1.7–8.2)
BASOPHILS: 0 % (ref 0.0–2.0)
EOSINOPHILS: 2 % (ref 0.5–7.8)
HCT: 30.6 % — ABNORMAL LOW (ref 35.8–46.3)
HGB: 9.6 g/dL — ABNORMAL LOW (ref 11.7–15.4)
IMMATURE GRANULOCYTES: 0.3 % (ref 0.0–5.0)
LYMPHOCYTES: 8 % — ABNORMAL LOW (ref 13–44)
MCH: 27.6 PG (ref 26.1–32.9)
MCHC: 31.4 g/dL (ref 31.4–35.0)
MCV: 87.9 FL (ref 79.6–97.8)
MONOCYTES: 9 % (ref 4.0–12.0)
MPV: 10.9 FL (ref 10.8–14.1)
NEUTROPHILS: 81 % — ABNORMAL HIGH (ref 43–78)
PLATELET: 269 10*3/uL (ref 150–450)
RBC: 3.48 M/uL — ABNORMAL LOW (ref 4.05–5.25)
RDW: 15.7 % — ABNORMAL HIGH (ref 11.9–14.6)
WBC: 10.7 10*3/uL (ref 4.3–11.1)

## 2014-05-19 LAB — AMB POC URINALYSIS DIP STICK AUTO W/ MICRO (PGU)
Bilirubin (UA POC): NEGATIVE
Glucose (UA POC): NEGATIVE mg/dL
Ketones (UA POC): NEGATIVE
Nitrites (UA POC): NEGATIVE
Protein (UA POC): 100
Specific gravity (UA POC): 1.025 (ref 1.001–1.035)
Urobilinogen (POC): 0.2
pH (UA POC): 7 (ref 4.6–8.0)

## 2014-05-19 MED ORDER — MORPHINE 4 MG/ML SYRINGE
4 mg/mL | INTRAMUSCULAR | Status: DC
Start: 2014-05-19 — End: 2014-05-19

## 2014-05-19 MED ORDER — MORPHINE 4 MG/ML SYRINGE
4 mg/mL | INTRAMUSCULAR | Status: AC
Start: 2014-05-19 — End: 2014-05-19
  Administered 2014-05-19: 20:00:00 via INTRAVENOUS

## 2014-05-19 MED ORDER — DIPHENHYDRAMINE HCL 50 MG/ML IJ SOLN
50 mg/mL | INTRAMUSCULAR | Status: AC
Start: 2014-05-19 — End: 2014-05-19
  Administered 2014-05-19: 20:00:00 via INTRAVENOUS

## 2014-05-19 MED ORDER — HALOPERIDOL LACTATE 5 MG/ML IJ SOLN
5 mg/mL | INTRAMUSCULAR | Status: AC
Start: 2014-05-19 — End: 2014-05-19
  Administered 2014-05-19: 20:00:00 via INTRAVENOUS

## 2014-05-19 MED ORDER — HYDROMORPHONE (PF) 1 MG/ML IJ SOLN
1 mg/mL | INTRAMUSCULAR | Status: AC
Start: 2014-05-19 — End: 2014-05-19
  Administered 2014-05-19: 22:00:00 via INTRAVENOUS

## 2014-05-19 MED ORDER — SODIUM CHLORIDE 0.9% BOLUS IV
0.9 % | Freq: Once | INTRAVENOUS | Status: AC
Start: 2014-05-19 — End: 2014-05-19
  Administered 2014-05-19: 20:00:00 via INTRAVENOUS

## 2014-05-19 MED FILL — HYDROMORPHONE (PF) 1 MG/ML IJ SOLN: 1 mg/mL | INTRAMUSCULAR | Qty: 1

## 2014-05-19 MED FILL — DIPHENHYDRAMINE HCL 50 MG/ML IJ SOLN: 50 mg/mL | INTRAMUSCULAR | Qty: 1

## 2014-05-19 MED FILL — HALOPERIDOL LACTATE 5 MG/ML IJ SOLN: 5 mg/mL | INTRAMUSCULAR | Qty: 1

## 2014-05-19 MED FILL — MORPHINE 4 MG/ML SYRINGE: 4 mg/mL | INTRAMUSCULAR | Qty: 1

## 2014-05-19 NOTE — H&P (Signed)
female presenting after ureteral stent removal in office today s/p right ureteroscopy for a 1.1CM stone by Dr. Archie PattenMonroe on 05-15-14. She has been having discomfort related to stent and is ready for it to be removed. First started having stones at age [age]yrs. Denies fever/chills, dysuria or difficulty voiding.  Later with pain in the flank and the pt went to  [facility] and is admitted for pain control  ????  Past Medical History??   Diagnosis?? Date??   ????? Factor V Leiden mutation (HCC)?? ??   ????? Infectious disease?? 02/2009??   ?? ?? ??Hx MRSA / E.Coli bacteremia, MRSA wound infection 2/2 port??   ????? Factor II deficiency (HCC)?? ??   ????? Calculus of kidney?? ??   ????? Depression?? ??   ????? Iron deficiency?? ??   ????? Migraines?? ??   ????? Pulmonary embolism (HCC)?? Multiple episodes??   ?? ?? x 16 events - recent hospitalizations 3/14??   ????? Bipolar disorder (HCC)?? ??   ????? Anxiety?? ??   ????? Restless leg syndrome?? ??   ????? Hematuria, microscopic?? ??     Past Surgical History??   Procedure?? Laterality?? Date??   ????? Cystoscopy?? ?? ?????   ?? ?? x 13??   ????? Lithotripsy?? ?? 2006??   ????? Hx vascular access?? ?? July, 2010??   ?? ?? [place]??   ????? Hx vascular access?? Right?? 2013??   ?? ?? pt has current rt [facility]??   ????? Hx appendectomy?? ?? ?????   ????? Hx tonsillectomy?? ?? ??   ????? Hx cesarean section?? ?? ??   ????? Hx tubal ligation?? ?? ??   ????? Hx ovarian cyst removal?? ?? ??   ?? ?? on R??     Current Outpatient Prescriptions??   Medication?? Sig?? Dispense?? Refill??   ????? cholecalciferol (VITAMIN D3) 1,000 unit cap?? Take?? by mouth daily.?? ?? ??   ????? oxyCODONE-acetaminophen (PERCOCET) 5-325 mg per tablet?? Take 1-2 Tabs by mouth every four (4) hours as needed for Pain. Max Daily Amount: 12 Tabs.?? 25 Tab?? 0??   ????? warfarin (COUMADIN) 5 mg tablet?? Take 5 mg by mouth daily.?? ?? ??   ????? ondansetron (ZOFRAN ODT) 8 mg disintegrating tablet?? Take 1 Tab by mouth every twelve (12) hours as needed for Nausea.?? 10 Tab?? 0??    ????? tamsulosin (FLOMAX) 0.4 mg capsule?? Take 1 Cap by mouth daily for 5 days.?? 5 Cap?? 0??   ????? oxyCODONE-acetaminophen (PERCOCET) 7.5-325 mg per tablet?? Take 1 Tab by mouth every four (4) hours as needed for Pain. Max Daily Amount: 6 Tabs.?? 15 Tab?? 0??   ????? pantoprazole (PROTONIX) 40 mg tablet?? Take 40 mg by mouth two (2) times a day.?? ?? ??   ????? zolpidem (AMBIEN) 10 mg tablet?? Take?? by mouth nightly as needed for Sleep.?? ?? ??   ????? butalbital-acetaminophen-caffeine (FIORICET, ESGIC) 50-325-40 mg per tablet?? Take 1 Tab by mouth every four (4) hours as needed for Pain or Headache. Max Daily Amount: 6 Tabs.?? 20 Tab?? 0??   ????? SUMAtriptan (IMITREX) 100 mg tablet?? Take 1 Tab by mouth once as needed for Migraine.?? 20 Tab?? 0??   ????? DULoxetine (CYMBALTA) 60 mg capsule?? Take 3 caps every day.?? Indications: ANXIETY WITH DEPRESSION (Patient taking differently: 30 mg two (2) times a day. Pt states she take 30mg  BID?? Indications: ANXIETY WITH DEPRESSION)?? 90 Cap?? 0??   ????? clonazePAM (KLONOPIN) 1 mg tablet?? Take 1 mg by mouth three (3)  times daily.?? ?? ??     Allergies??   Allergen?? Reactions??   ????? Ativan [Lorazepam]?? Itching??   ????? Codeine?? Rash??   ????? Demerol [Meperidine]?? Itching??   ?? ?? *  ??   ????? Dilaudid [Hydromorphone]?? Itching?? ????? Hydrocodone-Ibuprofen?? Unknown (comments)??   ????? Iodinated Contrast Media - Iv Dye?? Rash??   ?? ?? States also has swelling, and allergic to shellfish??   ????? Ketorolac Tromethamine?? Rash??   ????? Motrin [Ibuprofen]?? Other (comments)??   ?? ?? Causes stomach upset after 3 doses.??   ????? Pcn [Penicillins]?? Hives??   ????? Pneumococcal Vaccine?? Swelling??   ????? Pneumovax 23 [Pneumococcal 23-Val Ps Vaccine]?? Other (comments)??   ?? ?? Local reaction??   ????? Shellfish Containing Products?? Rash??     History??   ????  Social History??   ????? Marital Status:?? DIVORCED??   ?? ?? Spouse Name:?? N/A??   ????? Number of Children:?? N/A??   ????? Years of Education:?? N/A??   ????  Occupational History??   ????? Not on file.??   ????  Social History Main Topics??    ????? Smoking status:?? Never Smoker ??   ????? Smokeless tobacco:?? Never Used??   ????? Alcohol Use:?? Yes??   ?? ?? ?? Comment: rarely - only drinks 2 times per year??   ????? Drug Use:?? No??   ?? ?? ?? Comment: narcotic seeking per hospital hx.??   ????? Sexual Activity:??   ?? ?? Partners:?? Female??   ?? ?? Birth Tax adviser:?? Surgical??   ????  Other Topics?? Concern??   ????? Not on file??   ????  Social History Narrative??     Family History??   Problem?? Relation?? Age of Onset??   ????? Bleeding Prob?? Father?? ??   ?? ?? Factor V??   ????? Cancer?? Maternal Grandmother?? ??   ?? ?? metastatic breast CA.??   ????? Ovarian Cancer?? Mother?? 26??   ?? ?? passed away due to ca??   ????? Bipolar Disorder?? Mother?? ??   ????? Kidney Disease?? Brother?? ??   ?? ?? kidney stones??       Review of Systems  Constitutional:???? Negative for fever.  Genitourinary: Positive for hematuria, flank pain and history of urolithiasis.  Musculoskeletal: Positive for back pain.    PHYSICAL EXAM        General appearance - alert, well appearing, and in  Distress with rt flank pain  Mental status - alert, oriented to person, place, and time  Abdomen - soft, nontender, nondistended, no masses or organomegaly  Musculoskeletal - no joint tenderness, deformity or swelling.    Urinalysis earlier today:  UA - Dipstick  Results for orders placed or performed in visit on 05/19/14??   AMB POC URINALYSIS DIP STICK AUTO W/ MICRO (PGU)???????? Status: None??   Result?? Value?? Ref Range?? Status??   ?? Glucose (UA POC)?? Negative?? Negative mg/dL?? Final??   ?? Bilirubin (UA POC)?? Negative?? Negative?? Final??   ?? Ketones (UA POC)?? Negative?? Negative?? Final??   ?? Specific gravity (UA POC)?? 1.025?? 1.001 - 1.035?? Final??   ?? Blood (UA POC)?? Moderate?? Negative?? Final??   ?? pH (UA POC)?? 7.0?? 4.6 - 8.0?? Final??   ?? Protein (UA POC)?? 100 ?? Negative?? Final??   ?? Urobilinogen (POC)?? 0.2 mg/dL?? ?? Final?? ?? Nitrites (UA POC)?? Negative?? Negative?? Final??   ?? Leukocyte esterase (UA POC)?? Small?? Negative?? Final??     UA - Micro--ureteral stent  WBC - 2-3   RBC - 10-16  Bacteria - 0  Epith - 0    KUB Findings: right ureteral stent, no obvious renal or ureteral stones    Assessment and Plan  ?? ?? ICD-10-CM?? ICD-9-CM?? ??   1.?? Calculus of kidney?? N20.0?? 592.0?? AMB POC URINALYSIS DIP STICK AUTO W/ MICRO (PGU)??     after ureteral stent removal in office today s/p right ureteroscopy for a 1.1CM stone by Dr. Archie Patten on 05-15-14. She has been having discomfort related to stent and is ready for it to be removed. First started having stones at age 30yrs. Denies fever/chills, dysuria or difficulty voiding.  Later with pain in the flank and the pt went to  Er and is admitted for pain control   r/t stent removal today.         Patient Active Problem List   Diagnosis Code   ??? 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 Depression F32.9   ??? Anxiety F41.9   ??? Patient noncompliance Z91.19   ??? Bipolar 2 disorder (HCC) F31.81   ??? Migraine G43.909   ??? Restless leg syndrome G25.81   ??? Iron deficiency anemia D50.9   ??? Personal history of PE (pulmonary embolism), with Factor V mutation Z86.711   ??? Factor V Leiden mutation (HCC) D68.51   ??? Factor II deficiency (HCC) D68.2   ??? Supratherapeutic INR R79.1   ??? Coagulopathy (HCC) D68.9   ??? Epistaxis R04.0   ??? Melena K92.1   ??? Acute blood loss anemia D62   ??? Abdominal pain R10.9   ??? 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 Hematuria R31.9   ??? Hematoma T14.8   ??? 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HCC) T45.511A, D68.9   ??? Gross hematuria R31.0   ??? Calculus of kidney N20.0   ??? Ureteral stone N20.1

## 2014-05-19 NOTE — ED Notes (Signed)
Patient yelling and rolling in bed. Patient states ureter stent removed today per patient.

## 2014-05-19 NOTE — Telephone Encounter (Signed)
Pt called screaming and crying in severe pain.  Stent was removed this am.  She says pain is worse than when she had the stone.  Per [place], no stone on KUB today.  OP states stone fragmented completely.  Pt states she tolerated previous demerol shot well with NO reaction.  Told her she could come in for another shot but she will have to have a know driver. We cannot send her home in a cab.  If she cannot have a driver she knows, she will need to go to the ER.

## 2014-05-19 NOTE — ED Provider Notes (Signed)
HPI Comments: Patient is a 49 year old female who is coming in with right flank pain that has been severe 10 out of 10 pain.  She has a history of kidney stones as well as chronic pain she's had a stent placed and this was removed earlier today.  History is difficult to obtain because patient is screaming and yelling and rolling around.    [name]        Past Medical History:   Diagnosis Date   ??? Factor V Leiden mutation (HCC)    ??? Infectious disease 02/2009      Hx MRSA / E.Coli bacteremia, MRSA wound infection 2/2 port   ??? Factor II deficiency (HCC)    ??? Calculus of kidney    ??? Depression    ??? Iron deficiency    ??? Migraines    ??? 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 Bipolar disorder (HCC)    ??? Anxiety    ??? Restless leg syndrome    ??? Hematuria, microscopic        Past Surgical History:   Procedure Laterality Date   ??? Cystoscopy  ???     x 13   ??? Lithotripsy  2006   ??? Hx vascular access  July, 2010     Power Port placed in [place] removed   ??? Hx vascular access Right 2013     pt has current rt subclavian port - placed at [facility]   ??? Hx appendectomy  ???   ??? Hx tonsillectomy     ??? Hx cesarean section     ??? Hx tubal ligation     ??? Hx ovarian cyst removal       on R         Family History:   Problem Relation Age of Onset   ??? Bleeding Prob Father      Factor V   ??? Cancer Maternal Grandmother      metastatic breast CA.   ??? Ovarian Cancer Mother 25     passed away due to ca   ??? Bipolar Disorder Mother    ??? Kidney Disease Brother      kidney stones       History     Social History   ??? Marital Status: DIVORCED     Spouse Name: N/A   ??? Number of Children: N/A   ??? Years of Education: N/A     Occupational History   ??? Not on file.     Social History Main Topics   ??? Smoking status: Never Smoker    ??? Smokeless tobacco: Never Used   ??? Alcohol Use: Yes      Comment: rarely - only drinks 2 times per year    ??? Drug Use: No      Comment: narcotic seeking per hospital hx.   ??? Sexual Activity:     Partners: Male     Birth Control/ Protection: Surgical     Other Topics Concern   ??? Not on file     Social History Narrative           ALLERGIES: Ativan; Codeine; Demerol; Dilaudid; Hydrocodone-ibuprofen; Iodinated contrast media - iv dye; Ketorolac tromethamine; Motrin; Pcn; Pneumococcal vaccine; Pneumovax 23; and Shellfish containing products      Review of Systems   Unable to perform ROS: Other       Filed  Vitals:    05/19/14 1300   BP: 119/91   Pulse: 109   Temp: 98.5 ??F (36.9 ??C)   Resp: 18   Height: 5\' 4"  (1.626 m)   Weight: 69.854 kg (154 lb)   SpO2: 97%            Physical Exam   Constitutional: She is oriented to person, place, and time. She appears well-developed and well-nourished. She appears distressed (rolling around yelling).   HENT:   Head: Normocephalic and atraumatic.   Eyes: Conjunctivae are normal. Right eye exhibits no discharge. Left eye exhibits no discharge.   Neck: Neck supple.   Pulmonary/Chest: Effort normal. No stridor. No respiratory distress.   Abdominal: Soft. There is no tenderness. There is no rebound and no guarding.   Musculoskeletal:   Right flank pain   Neurological: She is alert and oriented to person, place, and time. No cranial nerve deficit. Coordination normal.   No focal weakness speech normal   Skin: Skin is warm and dry. No rash noted. No erythema.   Psychiatric: Her mood appears anxious. Her affect is angry and labile. She is agitated.   Nursing note and vitals reviewed.       MDM  Number of Diagnoses or Management Options  Right flank pain:   Diagnosis management comments: Patient has right flank pain labs at baseline.  Pain difficult to control she is doing much better after the medications that I discussed case with Dr. Dimple Casey who is going to admit patient downtown for further care.      Standley Brooking, MD; 05/19/2014 @4 :22 PM Voice dictation software was used  during the making of this note.  This software is not perfect and grammatical and other typographical errors may be present.  This note has not been proofread for errors.  ===================================================================          Amount and/or Complexity of Data Reviewed  Clinical lab tests: ordered and reviewed (Results for orders placed or performed during the hospital encounter of 05/19/14  -CBC WITH AUTOMATED DIFF       Result                                            Value                         Ref Range                       WBC                                               10.7                          4.3 - 11.1 K/uL                 RBC                                               3.48 (L)  4.05 - 5.25 M/uL                HGB                                               9.6 (L)                       11.7 - 15.4 g/dL                HCT                                               30.6 (L)                      35.8 - 46.3 %                   MCV                                               87.9                          79.6 - 97.8 FL                  MCH                                               27.6                          26.1 - 32.9 PG                  MCHC                                              31.4                          31.4 - 35.0 g/dL                RDW                                               15.7 (H)                      11.9 - 14.6 %                   PLATELET  269                           150 - 450 K/uL                  MPV                                               10.9                          10.8 - 14.1 FL                  DF                                                AUTOMATED                                                     NEUTROPHILS                                       81 (H)                        43 - 78 %                        LYMPHOCYTES                                       8 (L)                         13 - 44 %                       MONOCYTES                                         9                             4.0 - 12.0 %                    EOSINOPHILS                                       2                             0.5 - 7.8 %  BASOPHILS                                         0                             0.0 - 2.0 %                     IMMATURE GRANULOCYTES                             0.3                           0.0 - 5.0 %                     ABS. NEUTROPHILS                                  8.6 (H)                       1.7 - 8.2 K/UL                  ABS. LYMPHOCYTES                                  0.9                           0.5 - 4.6 K/UL                  ABS. MONOCYTES                                    1.0                           0.1 - 1.3 K/UL                  ABS. EOSINOPHILS                                  0.2                           0.0 - 0.8 K/UL                  ABS. BASOPHILS                                    0.0                           0.0 - 0.2 K/UL                  ABS. IMM. GRANS.  0.0                           0.0 - 0.5 K/UL             -METABOLIC PANEL, BASIC       Result                                            Value                         Ref Range                       Sodium                                            142                           136 - 145 mmol/L                Potassium                                         3.6                           3.5 - 5.1 mmol/L                Chloride                                          107                           98 - 107 mmol/L                 CO2                                               21                            21 - 32 mmol/L                  Anion gap                                         14                            7 - 16 mmol/L  Glucose                                           136 (H)                       65 - 100 mg/dL                  BUN                                               13                            6 - 23 MG/DL                    Creatinine                                        0.96                          0.6 - 1.0 MG/DL                 GFR est AA                                        >60                           >60 ml/min/1.61m2               GFR est non-AA                                    >60                           >60 ml/min/1.3m2               Calcium                                           8.2 (L)                       8.3 - 10.4 MG/DL          )  Tests in the radiology section of CPT??: reviewed    Risk of Complications, Morbidity, and/or Mortality  Presenting problems: high  Diagnostic procedures: high  Management options: high        Procedures

## 2014-05-19 NOTE — Addendum Note (Signed)
Addended by: Joan MayansLASHCHENKO, Aliea Bobe L on: 05/19/2014 10:50 AM      Modules accepted: Orders

## 2014-05-19 NOTE — Progress Notes (Signed)
Placed pt on continuous oximetry.

## 2014-05-19 NOTE — Progress Notes (Signed)
[facility]  [address]  [place], [place] [address]  [phone]    Krista Lopez  DOB: 1965-12-22    Chief Complaint   Patient presents with   ??? Kidney Stone          HPI     Krista Lopez is a 49 y.o. female presenting for ureteral stent removal s/p right ureteroscopy for a 1.1CM stone by Dr. Archie Patten on 05-15-14. She has been having discomfort related to stent and is ready for it to be removed. First started having stones at age 62yrs. Denies fever/chills, dysuria or difficulty voiding.     Past Medical History   Diagnosis Date   ??? Factor V Leiden mutation (HCC)    ??? Infectious disease 02/2009      Hx MRSA / E.Coli bacteremia, MRSA wound infection 2/2 port   ??? Factor II deficiency (HCC)    ??? Calculus of kidney    ??? Depression    ??? Iron deficiency    ??? Migraines    ??? 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 Bipolar disorder (HCC)    ??? Anxiety    ??? Restless leg syndrome    ??? Hematuria, microscopic      Past Surgical History   Procedure Laterality Date   ??? Cystoscopy  ???     x 13   ??? Lithotripsy  2006   ??? Hx vascular access  July, 2010     Power Port placed in [place], [place] removed   ??? Hx vascular access Right 2013     pt has current rt subclavian port - placed at [facility]   ??? Hx appendectomy  ???   ??? Hx tonsillectomy     ??? Hx cesarean section     ??? Hx tubal ligation     ??? Hx ovarian cyst removal       on R     Current Outpatient Prescriptions   Medication Sig Dispense Refill   ??? cholecalciferol (VITAMIN D3) 1,000 unit cap Take  by mouth daily.     ??? oxyCODONE-acetaminophen (PERCOCET) 5-325 mg per tablet Take 1-2 Tabs by mouth every four (4) hours as needed for Pain. Max Daily Amount: 12 Tabs. 25 Tab 0   ??? warfarin (COUMADIN) 5 mg tablet Take 5 mg by mouth daily.     ??? ondansetron (ZOFRAN ODT) 8 mg disintegrating tablet Take 1 Tab by mouth every twelve (12) hours as needed for Nausea. 10 Tab 0    ??? tamsulosin (FLOMAX) 0.4 mg capsule Take 1 Cap by mouth daily for 5 days. 5 Cap 0   ??? oxyCODONE-acetaminophen (PERCOCET) 7.5-325 mg per tablet Take 1 Tab by mouth every four (4) hours as needed for Pain. Max Daily Amount: 6 Tabs. 15 Tab 0   ??? pantoprazole (PROTONIX) 40 mg tablet Take 40 mg by mouth two (2) times a day.     ??? zolpidem (AMBIEN) 10 mg tablet Take  by mouth nightly as needed for Sleep.     ??? butalbital-acetaminophen-caffeine (FIORICET, ESGIC) 50-325-40 mg per tablet Take 1 Tab by mouth every four (4) hours as needed for Pain or Headache. Max Daily Amount: 6 Tabs. 20 Tab 0   ??? SUMAtriptan (IMITREX) 100 mg tablet Take 1 Tab by mouth once as needed for Migraine. 20 Tab 0   ??? DULoxetine (CYMBALTA) 60 mg capsule Take 3 caps every day.  Indications: ANXIETY WITH DEPRESSION (  Patient taking differently: 30 mg two (2) times a day. Pt states she take 30mg  BID  Indications: ANXIETY WITH DEPRESSION) 90 Cap 0   ??? clonazePAM (KLONOPIN) 1 mg tablet Take 1 mg by mouth three (3) times daily.       Allergies   Allergen Reactions   ??? Ativan [Lorazepam] Itching   ??? Codeine Rash   ??? Demerol [Meperidine] Itching     *     ??? Dilaudid [Hydromorphone] Itching   ??? Hydrocodone-Ibuprofen Unknown (comments)   ??? Iodinated Contrast Media - Iv Dye Rash     States also has swelling, and allergic to shellfish   ??? Ketorolac Tromethamine Rash   ??? Motrin [Ibuprofen] Other (comments)     Causes stomach upset after 3 doses.   ??? Pcn [Penicillins] Hives   ??? Pneumococcal Vaccine Swelling   ??? Pneumovax 23 [Pneumococcal 23-Val Ps Vaccine] Other (comments)     Local reaction   ??? Shellfish Containing Products Rash     History     Social History   ??? Marital Status: DIVORCED     Spouse Name: N/A   ??? Number of Children: N/A   ??? Years of Education: N/A     Occupational History   ??? Not on file.     Social History Main Topics   ??? Smoking status: Never Smoker    ??? Smokeless tobacco: Never Used   ??? Alcohol Use: Yes       Comment: rarely - only drinks 2 times per year   ??? Drug Use: No      Comment: narcotic seeking per hospital hx.   ??? Sexual Activity:     Partners: Male     Birth Control/ Protection: Surgical     Other Topics Concern   ??? Not on file     Social History Narrative     Family History   Problem Relation Age of Onset   ??? Bleeding Prob Father      Factor V   ??? Cancer Maternal Grandmother      metastatic breast CA.   ??? Ovarian Cancer Mother [id number]     passed away due to ca   ??? Bipolar Disorder Mother    ??? Kidney Disease Brother      kidney stones       Review of Systems  Constitutional:   Negative for fever.  Genitourinary: Positive for hematuria, flank pain and history of urolithiasis.  Musculoskeletal: Positive for back pain.    PHYSICAL EXAM    BP 141/79 mmHg   Pulse 107   Temp(Src) 98.8 ??F (37.1 ??C) (Tympanic)   Ht 5\' 4"  (1.626 m)   Wt 154 lb (69.854 kg)   BMI 26.42 kg/m2   LMP 07/28/2004    General appearance - alert, well appearing, and in no distress  Mental status - alert, oriented to person, place, and time  Abdomen - soft, nontender, nondistended, no masses or organomegaly  Musculoskeletal - no joint tenderness, deformity or swelling.    Urinalysis  UA - Dipstick  Results for orders placed or performed in visit on 05/19/14   AMB POC URINALYSIS DIP STICK AUTO W/ MICRO (PGU)     Status: None   Result Value Ref Range Status    Glucose (UA POC) Negative Negative mg/dL Final    Bilirubin (UA POC) Negative Negative Final    Ketones (UA POC) Negative Negative Final    Specific gravity (UA POC) 1.025 1.001 - 1.035 Final  Blood (UA POC) Moderate Negative Final    pH (UA POC) 7.0 4.6 - 8.0 Final    Protein (UA POC) 100  Negative Final    Urobilinogen (POC) 0.2 mg/dL  Final    Nitrites (UA POC) Negative Negative Final    Leukocyte esterase (UA POC) Small Negative Final     UA - Micro--ureteral stent  WBC - 2-3  RBC - 10-16  Bacteria - 0  Epith - 0    KUB Findings: right ureteral stent, no obvious renal or ureteral stones     Assessment and Plan    ICD-10-CM ICD-9-CM    1. Calculus of kidney N20.0 592.0 AMB POC URINALYSIS DIP STICK AUTO W/ MICRO (PGU)     Patient was placed in supine position. Ureteral stent removed intact by pulling suture protruding from urethra. Patient tolerated procedure with minimal discomfort. Instructed to drink plenty of fluid and may have residual discomfort from irritation r/t stent removal today.     General stone prevention reviewed. RTO in 29yr or sooner prn need.     Marlene Bast, FNP-BC  Time Spent with patient:  15 Minutes (>50% of this time was spent on counseling this patient about stone prevention).    Supervising physician available for consult: Dr. Katrinka Blazing

## 2014-05-19 NOTE — ED Notes (Signed)
C/o  Rt low back/flank pain since urinary stents removed today.

## 2014-05-19 NOTE — ED Notes (Signed)
Patient states morphine is making her itch and she needs benadryl.

## 2014-05-19 NOTE — Progress Notes (Signed)
[facility] Pt oriented to room and call light system. Dual skin assessment completed with Dede Queryori Schiazza, RN. Skin is intact with no breakdown noted. Pt has a tattoo on lower back. Pt rates pain at 8/10 at this time, nurse is waiting for orders to be placed by Dr. Dimple Caseyice. SCD's on. Call light within reach and bed in low and locked position. Instructed to call with needs.

## 2014-05-19 NOTE — ED Notes (Signed)
Bed assignment is Room #626 to Main hospital.

## 2014-05-19 NOTE — Progress Notes (Signed)
Pt received 2 Mg of dilaudid for severe pain in the flank area. Pt SP02 dropped to 88% and nurse placed patient on 2L 02 by NC. Pt placed on oxinet to monitor SP02 because of pain medication.

## 2014-05-19 NOTE — ED Notes (Signed)
Patient states morphine doesn't work for her dilaudid ordered for pain.

## 2014-05-19 NOTE — ED Notes (Signed)
TRANSFER - OUT REPORT:    Verbal report given to Lillia AbedLindsay RN on [facility]  being transferred to 6th floor for routine progression of care       Report consisted of patient???s Situation, Background, Assessment and   Recommendations(SBAR).     Information from the following report(s) ED Summary was reviewed with the receiving nurse.    Lines:   Venous Access Device power port  Upper chest (subclavicular area, right (Active)   Site Assessment Clean, dry, & intact 05/19/2014  3:35 PM   Dressing Status Clean, dry, & intact 05/19/2014  3:35 PM   Date Accessed (Medial Site) 05/19/14 05/19/2014  3:35 PM   Access Time (Medial Site) 1535 05/19/2014  3:35 PM   Access Needle Size (Site #1) 20 G 05/19/2014  3:35 PM   Access Needle Length (Medial Site) 1 inch 05/19/2014  3:35 PM   Positive Blood Return (Medial Site) Yes 05/19/2014  3:35 PM   Action Taken (Medial Site) Blood drawn 05/19/2014  3:35 PM        Opportunity for questions and clarification was provided.

## 2014-05-20 ENCOUNTER — Observation Stay: Admit: 2014-05-20 | Payer: MEDICARE | Primary: Student in an Organized Health Care Education/Training Program

## 2014-05-20 LAB — PROTHROMBIN TIME + INR
INR: 1 (ref 0.9–1.2)
Prothrombin time: 10.6 s (ref 9.6–12.0)

## 2014-05-20 MED ORDER — DOCUSATE SODIUM 100 MG CAP
100 mg | Freq: Two times a day (BID) | ORAL | Status: DC
Start: 2014-05-20 — End: 2014-05-24
  Administered 2014-05-20 – 2014-05-23 (×9): via ORAL

## 2014-05-20 MED ORDER — BUTALBITAL-ACETAMINOPHEN-CAFFEINE 50 MG-325 MG-40 MG TAB
50-325-40 mg | ORAL | Status: DC | PRN
Start: 2014-05-20 — End: 2014-05-24

## 2014-05-20 MED ORDER — CIPROFLOXACIN 500 MG TAB
500 mg | Freq: Two times a day (BID) | ORAL | Status: DC
Start: 2014-05-20 — End: 2014-05-24
  Administered 2014-05-20 – 2014-05-23 (×9): via ORAL

## 2014-05-20 MED ORDER — SODIUM CHLORIDE 0.9 % IJ SYRG
Freq: Three times a day (TID) | INTRAMUSCULAR | Status: DC
Start: 2014-05-20 — End: 2014-05-24
  Administered 2014-05-20 – 2014-05-23 (×12): via INTRAVENOUS

## 2014-05-20 MED ORDER — ONDANSETRON 4 MG TAB, RAPID DISSOLVE
4 mg | ORAL | Status: DC | PRN
Start: 2014-05-20 — End: 2014-05-24
  Administered 2014-05-22: 16:00:00 via ORAL

## 2014-05-20 MED ORDER — SODIUM CHLORIDE 0.9 % IJ SYRG
INTRAMUSCULAR | Status: DC | PRN
Start: 2014-05-20 — End: 2014-05-24

## 2014-05-20 MED ORDER — DIPHENHYDRAMINE 25 MG CAP
25 mg | ORAL | Status: DC | PRN
Start: 2014-05-20 — End: 2014-05-24

## 2014-05-20 MED ORDER — TAMSULOSIN SR 0.4 MG 24 HR CAP
0.4 mg | Freq: Every day | ORAL | Status: DC
Start: 2014-05-20 — End: 2014-05-24
  Administered 2014-05-20 – 2014-05-23 (×4): via ORAL

## 2014-05-20 MED ORDER — OXYCODONE 5 MG TAB
5 mg | ORAL | Status: DC | PRN
Start: 2014-05-20 — End: 2014-05-24
  Administered 2014-05-20 – 2014-05-23 (×2): via ORAL

## 2014-05-20 MED ORDER — PROMETHAZINE 25 MG/ML INJECTION
25 mg/mL | Freq: Four times a day (QID) | INTRAMUSCULAR | Status: DC | PRN
Start: 2014-05-20 — End: 2014-05-24

## 2014-05-20 MED ORDER — ZOLPIDEM 5 MG TAB
5 mg | Freq: Every evening | ORAL | Status: DC | PRN
Start: 2014-05-20 — End: 2014-05-24

## 2014-05-20 MED ORDER — ONDANSETRON (PF) 4 MG/2 ML INJECTION
4 mg/2 mL | INTRAMUSCULAR | Status: DC | PRN
Start: 2014-05-20 — End: 2014-05-24
  Administered 2014-05-20 – 2014-05-23 (×6): via INTRAVENOUS

## 2014-05-20 MED ORDER — ACETAMINOPHEN 325 MG TABLET
325 mg | ORAL | Status: DC | PRN
Start: 2014-05-20 — End: 2014-05-24

## 2014-05-20 MED ORDER — OXYBUTYNIN CHLORIDE 5 MG TAB
5 mg | Freq: Four times a day (QID) | ORAL | Status: DC | PRN
Start: 2014-05-20 — End: 2014-05-24
  Administered 2014-05-22 (×2): via ORAL

## 2014-05-20 MED ORDER — SUMATRIPTAN 50 MG TAB
50 mg | Freq: Every day | ORAL | Status: DC | PRN
Start: 2014-05-20 — End: 2014-05-24

## 2014-05-20 MED ORDER — PROMETHAZINE 25 MG RECTAL SUPPOSITORY
25 mg | Freq: Four times a day (QID) | RECTAL | Status: DC | PRN
Start: 2014-05-20 — End: 2014-05-24

## 2014-05-20 MED ORDER — HYDROMORPHONE (PF) 1 MG/ML IJ SOLN
1 mg/mL | INTRAMUSCULAR | Status: DC | PRN
Start: 2014-05-20 — End: 2014-05-24
  Administered 2014-05-20 – 2014-05-23 (×21): via INTRAVENOUS

## 2014-05-20 MED ORDER — WARFARIN 5 MG TAB
5 mg | Freq: Every evening | ORAL | Status: DC
Start: 2014-05-20 — End: 2014-05-24
  Administered 2014-05-20 – 2014-05-23 (×4): via ORAL

## 2014-05-20 MED ORDER — MAGNESIUM HYDROXIDE 400 MG/5 ML ORAL SUSP
400 mg/5 mL | Freq: Every day | ORAL | Status: DC | PRN
Start: 2014-05-20 — End: 2014-05-24

## 2014-05-20 MED ORDER — DIPHENHYDRAMINE HCL 50 MG/ML IJ SOLN
50 mg/mL | INTRAMUSCULAR | Status: DC | PRN
Start: 2014-05-20 — End: 2014-05-24

## 2014-05-20 MED ORDER — PANTOPRAZOLE 40 MG TAB, DELAYED RELEASE
40 mg | Freq: Two times a day (BID) | ORAL | Status: DC
Start: 2014-05-20 — End: 2014-05-24
  Administered 2014-05-20 – 2014-05-23 (×12): via ORAL

## 2014-05-20 MED ORDER — PHENAZOPYRIDINE 200 MG TAB
200 mg | Freq: Three times a day (TID) | ORAL | Status: DC | PRN
Start: 2014-05-20 — End: 2014-05-24

## 2014-05-20 MED ORDER — DULOXETINE 30 MG CAP, DELAYED RELEASE
30 mg | Freq: Two times a day (BID) | ORAL | Status: DC
Start: 2014-05-20 — End: 2014-05-24
  Administered 2014-05-20 – 2014-05-23 (×9): via ORAL

## 2014-05-20 MED ORDER — OXYCODONE-ACETAMINOPHEN 10 MG-325 MG TAB
10-325 mg | ORAL | Status: DC | PRN
Start: 2014-05-20 — End: 2014-05-24
  Administered 2014-05-23: 20:00:00 via ORAL

## 2014-05-20 MED ORDER — NALOXONE 0.4 MG/ML INJECTION
0.4 mg/mL | INTRAMUSCULAR | Status: DC | PRN
Start: 2014-05-20 — End: 2014-05-24

## 2014-05-20 MED ORDER — BISACODYL 10 MG RECTAL SUPPOSITORY
10 mg | Freq: Every day | RECTAL | Status: DC | PRN
Start: 2014-05-20 — End: 2014-05-24

## 2014-05-20 MED ORDER — BISACODYL 5 MG TAB, DELAYED RELEASE
5 mg | Freq: Every day | ORAL | Status: DC | PRN
Start: 2014-05-20 — End: 2014-05-24

## 2014-05-20 MED ORDER — PROMETHAZINE 25 MG TAB
25 mg | Freq: Four times a day (QID) | ORAL | Status: DC | PRN
Start: 2014-05-20 — End: 2014-05-24

## 2014-05-20 MED ORDER — SUMATRIPTAN 50 MG TAB
50 mg | Freq: Three times a day (TID) | ORAL | Status: DC | PRN
Start: 2014-05-20 — End: 2014-05-19

## 2014-05-20 MED ORDER — CLONAZEPAM 0.5 MG TAB
0.5 mg | Freq: Three times a day (TID) | ORAL | Status: DC
Start: 2014-05-20 — End: 2014-05-24
  Administered 2014-05-20 – 2014-05-23 (×12): via ORAL

## 2014-05-20 MED ORDER — DEXTROSE 5%-LACTATED RINGERS IV
INTRAVENOUS | Status: DC
Start: 2014-05-20 — End: 2014-05-24
  Administered 2014-05-20 – 2014-05-23 (×6): via INTRAVENOUS

## 2014-05-20 MED ORDER — ZOLPIDEM 5 MG TAB
5 mg | Freq: Every evening | ORAL | Status: DC | PRN
Start: 2014-05-20 — End: 2014-05-19

## 2014-05-20 MED FILL — HYDROMORPHONE (PF) 1 MG/ML IJ SOLN: 1 mg/mL | INTRAMUSCULAR | Qty: 2

## 2014-05-20 MED FILL — CIPROFLOXACIN 500 MG TAB: 500 mg | ORAL | Qty: 1

## 2014-05-20 MED FILL — PANTOPRAZOLE 40 MG TAB, DELAYED RELEASE: 40 mg | ORAL | Qty: 1

## 2014-05-20 MED FILL — CLONAZEPAM 0.5 MG TAB: 0.5 mg | ORAL | Qty: 2

## 2014-05-20 MED FILL — DULOXETINE 30 MG CAP, DELAYED RELEASE: 30 mg | ORAL | Qty: 1

## 2014-05-20 MED FILL — DOCUSATE SODIUM 100 MG CAP: 100 mg | ORAL | Qty: 1

## 2014-05-20 MED FILL — ONDANSETRON (PF) 4 MG/2 ML INJECTION: 4 mg/2 mL | INTRAMUSCULAR | Qty: 2

## 2014-05-20 MED FILL — DEXTROSE 5%-LACTATED RINGERS IV: INTRAVENOUS | Qty: 1000

## 2014-05-20 MED FILL — WARFARIN 5 MG TAB: 5 mg | ORAL | Qty: 1

## 2014-05-20 MED FILL — OXYCODONE 5 MG TAB: 5 mg | ORAL | Qty: 2

## 2014-05-20 MED FILL — TAMSULOSIN SR 0.4 MG 24 HR CAP: 0.4 mg | ORAL | Qty: 1

## 2014-05-20 NOTE — Progress Notes (Signed)
GU    Still with pain and no stones seen on KUB   Bladder frequency.  Will get CT and plan to replace stent tomorrow if no better.    Krista Lopez

## 2014-05-20 NOTE — Progress Notes (Signed)
Pt states that dilaudid works better than oxycodone for her pain. Pt with c/o nausea. Nurse administered zofran. Pt resting quietly in bed at this time. Call light within reach and bed in low and locked position. Instructed to call with needs.

## 2014-05-20 NOTE — Anesthesia Pre-Procedure Evaluation (Addendum)
Anesthetic History   No history of anesthetic complications            Review of Systems / Medical History  Patient summary reviewed and pertinent labs reviewed    Pulmonary  Within defined limits                 Neuro/Psych         Headaches (Migraines) and psychiatric history (Bipolar disorder)    Comments: RLS Cardiovascular                  Exercise tolerance: >4 METS  Comments: Factor V Leiden mutation; multiple episodes DVT / pulm emboli.  On warfarin.  Denies CP, SOB or changes in functional status outside of limitations due to pain from stone   GI/Hepatic/Renal         Renal disease: stones       Endo/Other             Other Findings   Comments: Opioid dependency         Physical Exam    Airway  Mallampati: II  TM Distance: 4 - 6 cm  Neck ROM: normal range of motion   Mouth opening: Normal     Cardiovascular    Rhythm: regular  Rate: normal         Dental    Dentition: Upper partial plate and Poor dentition     Pulmonary  Breath sounds clear to auscultation               Abdominal  GI exam deferred       Other Findings            Anesthetic Plan    ASA: 3  Anesthesia type: general            Anesthetic plan and risks discussed with: Patient      Will plan for GETA. Pt remains on Coumadin.

## 2014-05-20 NOTE — Progress Notes (Signed)
Per patient voided very little amount and hurts when she does void. Nurse performed bladder scan because pt stated that she felt like she was retaining. Largest amount recorded on bladder scan was 41 mL. Will continue to monitor. Call light within reach and bed in low and locked position.

## 2014-05-21 LAB — PROTHROMBIN TIME + INR
INR: 1.2 (ref 0.9–1.2)
Prothrombin time: 13.2 s — ABNORMAL HIGH (ref 9.6–12.0)

## 2014-05-21 MED ORDER — LIDOCAINE HCL 1 % (10 MG/ML) IJ SOLN
10 mg/mL (1 %) | INTRAMUSCULAR | Status: DC | PRN
Start: 2014-05-21 — End: 2014-05-24

## 2014-05-21 MED ORDER — PROPOFOL 10 MG/ML IV EMUL
10 mg/mL | INTRAVENOUS | Status: DC | PRN
Start: 2014-05-21 — End: 2014-05-21
  Administered 2014-05-21: 12:00:00 via INTRAVENOUS

## 2014-05-21 MED ORDER — HYDROMORPHONE (PF) 1 MG/ML IJ SOLN
1 mg/mL | INTRAMUSCULAR | Status: DC
Start: 2014-05-21 — End: 2014-05-21
  Administered 2014-05-21: 12:00:00 via INTRAVENOUS

## 2014-05-21 MED ORDER — PROMETHAZINE 25 MG/ML INJECTION
25 mg/mL | Freq: Once | INTRAMUSCULAR | Status: DC
Start: 2014-05-21 — End: 2014-05-21
  Administered 2014-05-21: 14:00:00 via INTRAVENOUS

## 2014-05-21 MED ORDER — SUCCINYLCHOLINE CHLORIDE 20 MG/ML INJECTION
20 mg/mL | INTRAMUSCULAR | Status: DC | PRN
Start: 2014-05-21 — End: 2014-05-21
  Administered 2014-05-21: 12:00:00 via INTRAVENOUS

## 2014-05-21 MED ORDER — IOTHALAMATE MEGLUMINE 60 % INJECTION
60 % | INTRAMUSCULAR | Status: DC | PRN
Start: 2014-05-21 — End: 2014-05-21
  Administered 2014-05-21: 13:00:00

## 2014-05-21 MED ORDER — LACTATED RINGERS IV
INTRAVENOUS | Status: AC
Start: 2014-05-21 — End: 2014-05-22
  Administered 2014-05-21 – 2014-05-22 (×2): via INTRAVENOUS

## 2014-05-21 MED ORDER — PHENYLEPHRINE 10 MG/ML INJECTION
10 mg/mL | INTRAMUSCULAR | Status: DC | PRN
Start: 2014-05-21 — End: 2014-05-21
  Administered 2014-05-21 (×5): via INTRAVENOUS

## 2014-05-21 MED ORDER — HYDROMORPHONE (PF) 2 MG/ML IJ SOLN
2 mg/mL | INTRAMUSCULAR | Status: DC | PRN
Start: 2014-05-21 — End: 2014-05-21

## 2014-05-21 MED ORDER — IOTHALAMATE MEGLUMINE 60 % INJECTION
60 % | INTRAMUSCULAR | Status: AC
Start: 2014-05-21 — End: ?

## 2014-05-21 MED ORDER — ROCURONIUM 10 MG/ML IV
10 mg/mL | INTRAVENOUS | Status: DC | PRN
Start: 2014-05-21 — End: 2014-05-21
  Administered 2014-05-21: 12:00:00 via INTRAVENOUS

## 2014-05-21 MED ORDER — FENTANYL CITRATE (PF) 50 MCG/ML IJ SOLN
50 mcg/mL | INTRAMUSCULAR | Status: AC
Start: 2014-05-21 — End: ?

## 2014-05-21 MED ORDER — LIDOCAINE (PF) 20 MG/ML (2 %) IJ SOLN
20 mg/mL (2 %) | INTRAMUSCULAR | Status: DC | PRN
Start: 2014-05-21 — End: 2014-05-21
  Administered 2014-05-21: 12:00:00 via INTRAVENOUS

## 2014-05-21 MED ORDER — OXYCODONE 5 MG TAB
5 mg | Freq: Once | ORAL | Status: DC
Start: 2014-05-21 — End: 2014-05-21
  Administered 2014-05-21: 14:00:00 via ORAL

## 2014-05-21 MED ORDER — FENTANYL CITRATE (PF) 50 MCG/ML IJ SOLN
50 mcg/mL | INTRAMUSCULAR | Status: DC | PRN
Start: 2014-05-21 — End: 2014-05-21
  Administered 2014-05-21: 12:00:00 via INTRAVENOUS

## 2014-05-21 MED ORDER — QUETIAPINE 100 MG TAB
100 mg | Freq: Every evening | ORAL | Status: DC
Start: 2014-05-21 — End: 2014-05-24
  Administered 2014-05-22 – 2014-05-23 (×2): via ORAL

## 2014-05-21 MED ORDER — MIDAZOLAM 1 MG/ML IJ SOLN
1 mg/mL | Freq: Once | INTRAMUSCULAR | Status: DC
Start: 2014-05-21 — End: 2014-05-21
  Administered 2014-05-21: 11:00:00 via INTRAVENOUS

## 2014-05-21 MED ORDER — LACTATED RINGERS IV
INTRAVENOUS | Status: DC
Start: 2014-05-21 — End: 2014-05-21
  Administered 2014-05-21: 14:00:00 via INTRAVENOUS

## 2014-05-21 MED ORDER — FENTANYL CITRATE (PF) 50 MCG/ML IJ SOLN
50 mcg/mL | Freq: Once | INTRAMUSCULAR | Status: DC
Start: 2014-05-21 — End: 2014-05-21
  Administered 2014-05-21: 11:00:00 via INTRAVENOUS

## 2014-05-21 MED ORDER — DEXAMETHASONE SODIUM PHOSPHATE 4 MG/ML IJ SOLN
4 mg/mL | INTRAMUSCULAR | Status: DC | PRN
Start: 2014-05-21 — End: 2014-05-21
  Administered 2014-05-21: 12:00:00 via INTRAVENOUS

## 2014-05-21 MED ORDER — ONDANSETRON (PF) 4 MG/2 ML INJECTION
4 mg/2 mL | INTRAMUSCULAR | Status: DC | PRN
Start: 2014-05-21 — End: 2014-05-21
  Administered 2014-05-21: 12:00:00 via INTRAVENOUS

## 2014-05-21 MED ORDER — ONDANSETRON (PF) 4 MG/2 ML INJECTION
4 mg/2 mL | INTRAMUSCULAR | Status: AC | PRN
Start: 2014-05-21 — End: 2014-05-22
  Administered 2014-05-22: 22:00:00 via INTRAVENOUS

## 2014-05-21 MED ORDER — LACTATED RINGERS IV
INTRAVENOUS | Status: DC | PRN
Start: 2014-05-21 — End: 2014-05-21
  Administered 2014-05-21: 12:00:00 via INTRAVENOUS

## 2014-05-21 MED FILL — DULOXETINE 30 MG CAP, DELAYED RELEASE: 30 mg | ORAL | Qty: 1

## 2014-05-21 MED FILL — HYDROMORPHONE (PF) 1 MG/ML IJ SOLN: 1 mg/mL | INTRAMUSCULAR | Qty: 2

## 2014-05-21 MED FILL — PANTOPRAZOLE 40 MG TAB, DELAYED RELEASE: 40 mg | ORAL | Qty: 1

## 2014-05-21 MED FILL — TAMSULOSIN SR 0.4 MG 24 HR CAP: 0.4 mg | ORAL | Qty: 1

## 2014-05-21 MED FILL — CLONAZEPAM 0.5 MG TAB: 0.5 mg | ORAL | Qty: 2

## 2014-05-21 MED FILL — ONDANSETRON (PF) 4 MG/2 ML INJECTION: 4 mg/2 mL | INTRAMUSCULAR | Qty: 2

## 2014-05-21 MED FILL — CONRAY 60 % INJECTION SOLUTION: 60 % | INTRAMUSCULAR | Qty: 50

## 2014-05-21 MED FILL — HYDROMORPHONE (PF) 1 MG/ML IJ SOLN: 1 mg/mL | INTRAMUSCULAR | Qty: 1

## 2014-05-21 MED FILL — DOCUSATE SODIUM 100 MG CAP: 100 mg | ORAL | Qty: 1

## 2014-05-21 MED FILL — PROMETHEGAN 25 MG RECTAL SUPPOSITORY: 25 mg | RECTAL | Qty: 1

## 2014-05-21 MED FILL — WARFARIN 5 MG TAB: 5 mg | ORAL | Qty: 1

## 2014-05-21 MED FILL — FENTANYL CITRATE (PF) 50 MCG/ML IJ SOLN: 50 mcg/mL | INTRAMUSCULAR | Qty: 2

## 2014-05-21 MED FILL — CIPROFLOXACIN 500 MG TAB: 500 mg | ORAL | Qty: 1

## 2014-05-21 MED FILL — LACTATED RINGERS IV: INTRAVENOUS | Qty: 1000

## 2014-05-21 NOTE — Progress Notes (Signed)
TRANSFER - OUT REPORT:    Verbal report given to Maurit[place]Sunjai, RN  on [facility]  being transferred to Pre-op for ordered procedure       Report consisted of patient???s Situation, Background, Assessment and   Recommendations(SBAR).     Information from the following report(s) SBAR, Kardex and MAR was reviewed with the receiving nurse.    Lines:   Venous Access Device power port  Upper chest (subclavicular area, right (Active)   Central Line Being Utilized Yes 05/21/2014  3:30 AM   Criteria for Appropriate Use Other (comment) 05/21/2014  3:30 AM   Site Assessment Clean, dry, & intact 05/21/2014  3:30 AM   Date of Last Dressing Change 05/19/14 05/21/2014  3:30 AM   Dressing Status Clean, dry, & intact 05/21/2014  3:30 AM   Dressing Type Tape;Transparent;Disk with Chlorhexadine Gluconate (CHG) 05/21/2014  3:30 AM   Action Taken Open ports on tubing capped 05/21/2014  3:30 AM   Date Accessed (Medial Site) 05/19/14 05/21/2014  3:30 AM   Access Time (Medial Site) 1535 05/21/2014  3:30 AM   Access Needle Size (Site #1) 20 G 05/19/2014  3:35 PM   Access Needle Length (Medial Site) 1 inch 05/19/2014  3:35 PM   Positive Blood Return (Medial Site) Yes 05/21/2014  3:30 AM   Action Taken (Medial Site) Blood drawn 05/19/2014  3:35 PM   Alcohol Cap Used Yes 05/19/2014 11:04 PM        Opportunity for questions and clarification was provided.      Patient transported with:   O2 @ 2 liters  Tech

## 2014-05-21 NOTE — Brief Op Note (Signed)
BRIEF OPERATIVE NOTE    Date of Procedure: 05/21/2014   Preoperative Diagnosis: RIGHT URETERAL STONE  Postoperative Diagnosis: RIGHT URETERAL STONE    Procedure(s):  CYSTOSCOPY, RIGHT URETEROSCOPY, DIFFICULT RIGHT STENT PLACEMENT  Surgeon(s) and Role:     * Morene CrockerJames David Roylee Chaffin, MD - Primary  Anesthesia: General   Estimated Blood Loss:   Specimens: * No specimens in log *   Findings:    Complications:   Implants: * No implants in log *

## 2014-05-21 NOTE — Other (Signed)
TRANSFER - OUT REPORT:    Verbal report given to Sue LushAndrea RN on [facility]  being transferred to 626 for routine post - op       Report consisted of patient???s Situation, Background, Assessment and   Recommendations(SBAR).     Information from the following report(s) SBAR, OR Summary, Procedure Summary, Intake/Output and MAR was reviewed with the receiving nurse.    Lines:   Venous Access Device power port  Upper chest (subclavicular area, right (Active)   Central Line Being Utilized Yes 05/21/2014  9:53 AM   Criteria for Appropriate Use Limited/no vessel suitable for conventional peripheral access 05/21/2014  9:53 AM   Site Assessment Clean, dry, & intact 05/21/2014  9:53 AM   Date of Last Dressing Change 05/19/14 05/21/2014  9:24 AM   Dressing Status Clean, dry, & intact 05/21/2014  9:53 AM   Dressing Type Transparent;Tape 05/21/2014  9:53 AM   Action Taken Open ports on tubing capped 05/21/2014  9:53 AM   Date Accessed (Medial Site) 05/19/14 05/21/2014  3:30 AM   Access Time (Medial Site) 1535 05/21/2014  3:30 AM   Access Needle Size (Site #1) 20 G 05/19/2014  3:35 PM   Access Needle Length (Medial Site) 1 inch 05/19/2014  3:35 PM   Positive Blood Return (Medial Site) Yes 05/21/2014  3:30 AM   Action Taken (Medial Site) Blood drawn 05/19/2014  3:35 PM   Alcohol Cap Used Yes 05/19/2014 11:04 PM        Opportunity for questions and clarification was provided.      Patient transported with:   O2 @ 4 liters    [facility]    Unable to locate family for update.

## 2014-05-21 NOTE — Other (Signed)
TRANSFER - IN REPORT:    Verbal report received from [place] River[place], RN  on [facility]  being received from 6th floor  for ordered procedure      Report consisted of patient???s Situation, Background, Assessment and   Recommendations(SBAR).     Information from the following report(s) SBAR, Kardex, Intake/Output, MAR, Recent Results and Med Rec Status was reviewed with the receiving nurse.    Screening Assessment for C Diff:     1.  Three (3) or more diarrheal (liquid unformed) stools in less than 24 hours  no     2.  If yes, has patient off laxatives for more than 24 hours? NOT APPLICABLE     3.  Was a stool specimen sent for C. Difficile toxin A and B?  NOT APPLICABLE     4.  Was the patient placed on contact isolation?  NOT APPLICABLE    Opportunity for questions and clarification was provided.      Assessment completed upon patient???s arrival to unit and care assumed.

## 2014-05-21 NOTE — Op Note (Signed)
[facility]                            [address]                           [place], [place] [id number]                                [phone]                                OPERATIVE REPORT    NAME:  Krista GamblerMisanik, Krista D                         MR:  [id number]  LOC:  [id number]            SEX:  F               ACCT:  [id number]  DOB:  19-Mar-1965            AGE:  48              PT:  I  ADMIT:  05/19/2014          DSCH:                 MSV:        DATE OF PROCEDURE: 05/21/2014    PREOPERATIVE DIAGNOSES  1. Status post right ureteroscopy, laser lithotripsy, and right ureteral  stent.  2. Post right ureteral stent removal with distal ureteral obstruction  from edema and no stone with right flank pain.    POSTOPERATIVE DIAGNOSES  1. Status post right ureteroscopy, laser lithotripsy, and right ureteral  stent.  2. Post right ureteral stent removal with distal ureteral obstruction  from edema and no stone with right flank pain.    NAME OF PROCEDURE  1. Cystoscopy.  2. Right ureteroscopy.  3. Difficult right ureteral stent placement.    SURGEON: J. Gardner Candleavid Tenecia Ignasiak, MD    ANESTHESIA: General.    BLOOD LOSS: Minimal.    OPERATIVE INDICATION: A 49 year old female who recently had a surgery for  a right UPJ stone. The patient had right ureteroscopy, Holmium laser  lithotripsy of ureteral calculus, and placement of a right double-J  ureteral stent. The stent was removed 2 days ago, and the patient  developed significant right flank pain. She has been on Coumadin for  cardiac disease. The patient was readmitted, given IV fluid hydration,  and did not improve with the pain medication, and a CT scan showed distal  ureteral obstruction, probably due to edema, but no obvious  calcifications were seen. The patient was brought back to surgery for  stent placement.    PROCEDURE: The patient was taken to the operating room suite, underwent  general anesthesia, placed in the dorsal lithotomy  position, prepped with  Betadine and draped in appropriate manner. A 22-French cystoscope was  passed per urethra into the bladder. The left ureteral orifice was  identified, and the right ureteral orifice was very edematous with a lot  of irritation, and a true lumen for the ureter could not be identified  very well. The patient had an angulation of the ureteral orifice seen on  the left side,  and I suspected angulated on the right as well. Using the  22-French sheath and 30-degree lens, I could not get an angle on the  guidewire to angle into a ureteral orifice. I used the 70-degree lens  with Debera Lat bridge; still could not get a guidewire. I switched to a  different movable core guidewire, and the 70-degree lens still could not  pass anything or identify an obvious ureteral orifice. At this point, the  ureteroscope was used to try to get a straight shot into the ureteral  orifice; still could not get the true lumen due to the edema and the  inflammation on the right side of the bladder. Eventually, the  ureteroscope was removed and using the 30-degree lens with the 17-French  sheath, a guidewire was passed up in through this and eventually, with  gentle coaxing and a small bend at the tip of the guidewire, eventually I  angled the guidewire into the ureteral orifice and up into the kidney.    Due to the hydronephrosis and the proximal tortuosity from the  hydronephrosis, it was difficult to pass the guidewire up into the right  kidney. An open-ended ureteral catheter was passed over the guidewire,  and a retrograde pyelogram confirmed the dilated ureter and proximal loop  and tortuosity from the hydronephrosis. Using a Sensor guidewire,  eventually this passed up into the kidney with some difficulty. The  open-ended ureteral catheter was removed. The 17-French sheath was  removed and the 22-French sheath and ________ 30-degree lens was passed  over the guidewire. Using this, a 6-French 26-cm stent with a  short  segment of string was passed up into the right ureter and eventually, the  proximal end curled in the renal pelvis; it was very dilated, and the  guidewire was removed with good placement of the right ureteral stent.  Slowly, there was efflux of contrast and urine from the right kidney, and  the kidney began to decompress, and the ureter began to straighten out.  At the end of the case, the stent was in good position, and a Foley  catheter was placed. The patient was sent back to the floor in stable  condition.    Would keep the catheter overnight and keep the stent for at least a  week.                Launa Grill, MD      A                This is an unverified document unless signed by physician.    TID:  [id number]                                      DT:  05/21/2014 11:52 P  JOB:  [id number]           DOC#:  [id number]           DD:  05/21/2014    cc:   Launa Grill, MD

## 2014-05-21 NOTE — Op Note (Signed)
[facility]                            [address]                           [place] [id number]                                [phone]                                OPERATIVE REPORT    NAME:  Krista GamblerMisanik, Clarissia D                         MR:  [id number]  LOC:  6MS [id number] 01            SEX:  F               ACCT:  [id number]  DOB:  [birth year]            AGE:  49              PT:  I  ADMIT:  05/19/2014          DSCH:                 MSV:        DATE OF PROCEDURE: 05/21/2014    PREOPERATIVE DIAGNOSES  1. Status post right ureteroscopy, laser lithotripsy, and right ureteral  stent.  2. Post right ureteral stent removal with distal ureteral obstruction  from edema and no stone with right flank pain.    POSTOPERATIVE DIAGNOSES  1. Status post right ureteroscopy, laser lithotripsy, and right ureteral  stent.  2. Post right ureteral stent removal with distal ureteral obstruction  from edema and no stone with right flank pain.    NAME OF PROCEDURE  1. Cystoscopy.  2. Right ureteroscopy.  3. Difficult right ureteral stent placement.    SURGEON: J. Gardner Candleavid Beckhem Isadore, MD    ANESTHESIA: General.    BLOOD LOSS: Minimal.    OPERATIVE INDICATION: A 49 year old female who recently had a surgery for  a right UPJ stone. The patient had right ureteroscopy, Holmium laser  lithotripsy of ureteral calculus, and placement of a right double-J  ureteral stent. The stent was removed 2 days ago, and the patient  developed significant right flank pain. She has been on Coumadin for  cardiac disease. The patient was readmitted, given IV fluid hydration,  and did not improve with the pain medication, and a CT scan showed distal  ureteral obstruction, probably due to edema, but no obvious  calcifications were seen. The patient was brought back to surgery for  stent placement.    PROCEDURE: The patient was taken to the operating room suite, underwent   general anesthesia, placed in the dorsal lithotomy position, prepped with  Betadine and draped in appropriate manner. A 22-French cystoscope was  passed per urethra into the bladder. The left ureteral orifice was  identified, and the right ureteral orifice was very edematous with a lot  of irritation, and a true lumen for the ureter could not be identified  very well. The patient had an angulation of the ureteral orifice seen on  the left side,  and I suspected angulated on the right as well. Using the  22-French sheath and 30-degree lens, I could not get an angle on the  guidewire to angle into a ureteral orifice. I used the 70-degree lens  with Debera Lat bridge; still could not get a guidewire. I switched to a  different movable core guidewire, and the 70-degree lens still could not  pass anything or identify an obvious ureteral orifice. At this point, the  ureteroscope was used to try to get a straight shot into the ureteral  orifice; still could not get the true lumen due to the edema and the  inflammation on the right side of the bladder. Eventually, the  ureteroscope was removed and using the 30-degree lens with the 17-French  sheath, a guidewire was passed up in through this and eventually, with  gentle coaxing and a small bend at the tip of the guidewire, eventually I  angled the guidewire into the ureteral orifice and up into the kidney.    Due to the hydronephrosis and the proximal tortuosity from the  hydronephrosis, it was difficult to pass the guidewire up into the right  kidney. An open-ended ureteral catheter was passed over the guidewire,  and a retrograde pyelogram confirmed the dilated ureter and proximal loop  and tortuosity from the hydronephrosis. Using a Sensor guidewire,  eventually this passed up into the kidney with some difficulty. The  open-ended ureteral catheter was removed. The 17-French sheath was  removed and the 22-French sheath and ________ 30-degree lens was passed   over the guidewire. Using this, a 6-French 26-cm stent with a short  segment of string was passed up into the right ureter and eventually, the  proximal end curled in the renal pelvis; it was very dilated, and the  guidewire was removed with good placement of the right ureteral stent.  Slowly, there was efflux of contrast and urine from the right kidney, and  the kidney began to decompress, and the ureter began to straighten out.  At the end of the case, the stent was in good position, and a Foley  catheter was placed. The patient was sent back to the floor in stable  condition.    Would keep the catheter overnight and keep the stent for at least a  week.                Launa Grill, MD      A                This is an unverified document unless signed by physician.    TID:  [id number]                                      DT:  05/21/2014 11:52 P  JOB:  [id number]           DOC#:  [id number]           DD:  05/21/2014    cc:   Launa Grill, MD

## 2014-05-21 NOTE — Progress Notes (Addendum)
TRANSFER - IN REPORT:    Verbal report received from [place], RN [facility]  being received from PACU for routine progression of care      Report consisted of patient???s Situation, Background, Assessment and   Recommendations(SBAR).     Information from the following report(s) SBAR, Procedure Summary, MAR and Recent Results was reviewed with the receiving nurse.    Opportunity for questions and clarification was provided.      Assessment completed upon patient???s arrival to unit and care assumed.

## 2014-05-21 NOTE — Anesthesia Post-Procedure Evaluation (Signed)
Post-Anesthesia Evaluation and Assessment    Patient: Krista AgeeShannon Lopez MRN: [id number]  SSN: [id number]    Date of Birth: October 20, 1965  Age: 49 y.o.  Sex: female       Cardiovascular Function/Vital Signs  Visit Vitals   Item Reading   ??? BP 139/66 mmHg   ??? Pulse 101   ??? Temp 36.8 ??C (98.2 ??F)   ??? Resp 18   ??? SpO2 95%       Patient is status post general anesthesia for Procedure(s):  CYSTOSCOPY, RIGHT URETEROSCOPY, RIGHT STENT PLACEMENT, RIGHT RETROGRADE.    Nausea/Vomiting: None    Postoperative hydration reviewed and adequate.    Pain:  Pain Scale 1: Numeric (0 - 10) (05/21/14 0953)  Pain Intensity 1: 2 (05/21/14 0953)   Managed    Neurological Status:   Neuro (WDL): Within Defined Limits (05/21/14 0953)   At baseline    Mental Status and Level of Consciousness: Alert and oriented     Pulmonary Status:   O2 Device: Nasal cannula (05/21/14 0953)   Adequate oxygenation and airway patent    Complications related to anesthesia: None    Post-anesthesia assessment completed. No concerns. Pt doing well.     Signed By: Dola ArgyleJames R. Amylee Lodato, MD     May 21, 2014

## 2014-05-22 LAB — POC INR

## 2014-05-22 MED FILL — CLONAZEPAM 0.5 MG TAB: 0.5 mg | ORAL | Qty: 2

## 2014-05-22 MED FILL — OXYBUTYNIN CHLORIDE 5 MG TAB: 5 mg | ORAL | Qty: 1

## 2014-05-22 MED FILL — DEXAMETHASONE SODIUM PHOSPHATE 4 MG/ML IJ SOLN: 4 mg/mL | INTRAMUSCULAR | Qty: 4

## 2014-05-22 MED FILL — HYDROMORPHONE (PF) 1 MG/ML IJ SOLN: 1 mg/mL | INTRAMUSCULAR | Qty: 1

## 2014-05-22 MED FILL — CIPROFLOXACIN 500 MG TAB: 500 mg | ORAL | Qty: 1

## 2014-05-22 MED FILL — ONDANSETRON 4 MG TAB, RAPID DISSOLVE: 4 mg | ORAL | Qty: 1

## 2014-05-22 MED FILL — DOCUSATE SODIUM 100 MG CAP: 100 mg | ORAL | Qty: 1

## 2014-05-22 MED FILL — ONDANSETRON (PF) 4 MG/2 ML INJECTION: 4 mg/2 mL | INTRAMUSCULAR | Qty: 4

## 2014-05-22 MED FILL — DULOXETINE 30 MG CAP, DELAYED RELEASE: 30 mg | ORAL | Qty: 1

## 2014-05-22 MED FILL — QUELICIN 20 MG/ML INJECTION SOLUTION: 20 mg/mL | INTRAMUSCULAR | Qty: 160

## 2014-05-22 MED FILL — QUETIAPINE 100 MG TAB: 100 mg | ORAL | Qty: 3

## 2014-05-22 MED FILL — HYDROMORPHONE (PF) 1 MG/ML IJ SOLN: 1 mg/mL | INTRAMUSCULAR | Qty: 2

## 2014-05-22 MED FILL — WARFARIN 5 MG TAB: 5 mg | ORAL | Qty: 1

## 2014-05-22 MED FILL — PANTOPRAZOLE 40 MG TAB, DELAYED RELEASE: 40 mg | ORAL | Qty: 1

## 2014-05-22 MED FILL — PROPOFOL 10 MG/ML IV EMUL: 10 mg/mL | INTRAVENOUS | Qty: 150

## 2014-05-22 MED FILL — PHENYLEPHRINE 10 MG/ML INJECTION: 10 mg/mL | INTRAMUSCULAR | Qty: 400

## 2014-05-22 MED FILL — LIDOCAINE (PF) 20 MG/ML (2 %) IJ SOLN: 20 mg/mL (2 %) | INTRAMUSCULAR | Qty: 80

## 2014-05-22 MED FILL — TAMSULOSIN SR 0.4 MG 24 HR CAP: 0.4 mg | ORAL | Qty: 1

## 2014-05-22 MED FILL — ROCURONIUM 10 MG/ML IV: 10 mg/mL | INTRAVENOUS | Qty: 5

## 2014-05-22 MED FILL — ONDANSETRON (PF) 4 MG/2 ML INJECTION: 4 mg/2 mL | INTRAMUSCULAR | Qty: 2

## 2014-05-22 MED FILL — LACTATED RINGERS IV: INTRAVENOUS | Qty: 1000

## 2014-05-22 NOTE — Progress Notes (Signed)
Patient denies any post anesthetic complaints.

## 2014-05-22 NOTE — Progress Notes (Addendum)
Subjective:   Daily Progress Note: 05/22/2014 7:59 AM  C/o right pelvic pain, states she is no better since stent placed.   Objective:     BP 109/54 mmHg   Pulse 93   Temp(Src) 97.5 ??F (36.4 ??C)   Resp 18   SpO2 96%   LMP 07/28/2004 O2 Flow Rate (L/min): 4 l/min O2 Device: Nasal cannula    Temp (24hrs), Avg:97.8 ??F (36.6 ??C), Min:96.6 ??F (35.9 ??C), Max:98.6 ??F (37 ??C)      04/09 1901 - 04/11 0700  In: 1400 [P.O.:600; I.V.:800]  Out: 1505 [Urine:1500]       @RRSCHMED @  @RRIVMEDS @  @RRPRNMEDS @    Exam:       Data Review    No results found for this or any previous visit (from the past 24 hour(s)).    Assessment   Principal Problem:    Hydronephrosis (05/22/2014)      Overview: Date of Procedure: 05/21/2014       Preoperative Diagnosis: RIGHT URETERAL STONE      Postoperative Diagnosis: RIGHT URETERAL STONE??       Procedure(s):      CYSTOSCOPY, RIGHT URETEROSCOPY, DIFFICULT RIGHT STENT PLACEMENT    Active Problems:    Ureteral stone (05/19/2014)      Renal colic (05/20/2014)        Plan:  She states that she hurts too much to go home, will remove foley, leave as inpt. Has Ditropan for bladder spasms. Plan to leave stent for one week.

## 2014-05-23 ENCOUNTER — Inpatient Hospital Stay: Admit: 2014-05-23 | Payer: MEDICARE | Primary: Student in an Organized Health Care Education/Training Program

## 2014-05-23 MED ORDER — HEPARIN, PORCINE (PF) 100 UNIT/ML IV SYRINGE
100 unit/mL | INTRAVENOUS | Status: DC | PRN
Start: 2014-05-23 — End: 2014-05-24
  Administered 2014-05-23: 13:00:00

## 2014-05-23 MED ORDER — HYDROMORPHONE (PF) 1 MG/ML IJ SOLN
1 mg/mL | Freq: Once | INTRAMUSCULAR | Status: AC
Start: 2014-05-23 — End: 2014-05-23
  Administered 2014-05-23: 22:00:00 via INTRAVENOUS

## 2014-05-23 MED ORDER — OXYCODONE-ACETAMINOPHEN 5 MG-325 MG TAB
5-325 mg | ORAL_TABLET | ORAL | Status: DC | PRN
Start: 2014-05-23 — End: 2014-05-29

## 2014-05-23 MED ORDER — PHENAZOPYRIDINE 200 MG TAB
200 mg | ORAL_TABLET | Freq: Three times a day (TID) | ORAL | Status: AC | PRN
Start: 2014-05-23 — End: 2014-05-26

## 2014-05-23 MED FILL — HYDROMORPHONE (PF) 1 MG/ML IJ SOLN: 1 mg/mL | INTRAMUSCULAR | Qty: 2

## 2014-05-23 MED FILL — DULOXETINE 30 MG CAP, DELAYED RELEASE: 30 mg | ORAL | Qty: 1

## 2014-05-23 MED FILL — OXYCODONE 5 MG TAB: 5 mg | ORAL | Qty: 2

## 2014-05-23 MED FILL — WARFARIN 5 MG TAB: 5 mg | ORAL | Qty: 1

## 2014-05-23 MED FILL — CLONAZEPAM 0.5 MG TAB: 0.5 mg | ORAL | Qty: 2

## 2014-05-23 MED FILL — PANTOPRAZOLE 40 MG TAB, DELAYED RELEASE: 40 mg | ORAL | Qty: 1

## 2014-05-23 MED FILL — CIPROFLOXACIN 500 MG TAB: 500 mg | ORAL | Qty: 1

## 2014-05-23 MED FILL — OXYCODONE-ACETAMINOPHEN 10 MG-325 MG TAB: 10-325 mg | ORAL | Qty: 1

## 2014-05-23 MED FILL — TAMSULOSIN SR 0.4 MG 24 HR CAP: 0.4 mg | ORAL | Qty: 1

## 2014-05-23 MED FILL — MONOJECT PREFILL ADVANCED (PF) 100 UNIT/ML INTRAVENOUS SYRINGE: 100 unit/mL | INTRAVENOUS | Qty: 3

## 2014-05-23 MED FILL — DOCUSATE SODIUM 100 MG CAP: 100 mg | ORAL | Qty: 1

## 2014-05-23 MED FILL — ONDANSETRON (PF) 4 MG/2 ML INJECTION: 4 mg/2 mL | INTRAMUSCULAR | Qty: 2

## 2014-05-23 MED FILL — QUETIAPINE 100 MG TAB: 100 mg | ORAL | Qty: 3

## 2014-05-23 NOTE — Progress Notes (Signed)
Subjective:   Daily Progress Note: 05/23/2014 7:48 AM  No Complaints, feels better today.   Objective:     BP 116/72 mmHg   Pulse 64   Temp(Src) 98.2 ??F (36.8 ??C)   Resp 18   SpO2 95%   LMP 07/28/2004 O2 Flow Rate (L/min): 4 l/min O2 Device: Nasal cannula    Temp (24hrs), Avg:98.1 ??F (36.7 ??C), Min:97.3 ??F (36.3 ??C), Max:98.8 ??F (37.1 ??C)      04/10 1901 - 04/12 0700  In: [id number] [P.O.:720; I.V.:2973]  Out: 350 [Urine:350]       @RRSCHMED @  @RRIVMEDS @  @RRPRNMEDS @    Exam:       Data Review    No results found for this or any previous visit (from the past 24 hour(s)).    Assessment   Principal Problem:    Hydronephrosis (05/22/2014)      Overview: Date of Procedure: 05/21/2014       Preoperative Diagnosis: RIGHT URETERAL STONE      Postoperative Diagnosis: RIGHT URETERAL STONE??       Procedure(s):      CYSTOSCOPY, RIGHT URETEROSCOPY, DIFFICULT RIGHT STENT PLACEMENT    Active Problems:    Ureteral stone (05/19/2014)      Renal colic (05/20/2014)        Plan:  Discharge home today, appt in one week , remove stent.

## 2014-05-23 NOTE — Progress Notes (Signed)
Care Management Interventions  PCP Verified by CM: Yes  Palliative Care Consult: No  Transition of Care Consult (CM Consult): Discharge Planning  Physical Therapy Consult: No  Speech Therapy Consult: No  Confirm Follow Up Transport: Family  Plan discussed with Pt/Family/Caregiver: Yes  Freedom of Choice Offered: Yes  Discharge Location  Discharge Placement: Home     DC. Chart check, No home orders.   Delsa SaleSharon E. Ralph Leydenoyle, LISW

## 2014-05-23 NOTE — Progress Notes (Signed)
Dr. Archie PattenMonroe notified of patient's L distal tib/fib fracture notified by xray. MD will consult orthopedic.

## 2014-05-23 NOTE — Progress Notes (Signed)
Notified Dr. Deloria LairHamilton patient had a weber splint placed today, cleared by orthopedic surgery to go home as surgery can not be done for 5-7 day due to swelling may go home. Per Dr. Deloria LairHamilton patient is to be discharged as she is medically cleared at this time.

## 2014-05-23 NOTE — Consults (Signed)
Consult    CONSULT DATE - 05/23/14  CONSULTING PHYSICIAN - DR Glendale ChardJAMES MONROE    Subjective:     Krista Lopez is a 49 y.o. Caucasian female who is being seen for left lower extremity pain after sustaining a fall at the nursing station when she walked to the desk for them to call her ride after being discharged. Onset of symptoms was abrupt with unchanged course since that time. The pain is located in the left lower extermity. Patient describes the pain as continuous and rated as severe. Pain has been associated with movement. Patient denies other injuries.     Past Medical History   Diagnosis Date   ??? Factor V Leiden mutation (HCC)    ??? Infectious disease 02/2009      Hx MRSA / E.Coli bacteremia, MRSA wound infection 2/2 port   ??? Factor II deficiency (HCC)    ??? Calculus of kidney    ??? Depression    ??? Iron deficiency    ??? Migraines    ??? 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 Bipolar disorder (HCC)    ??? Anxiety    ??? Restless leg syndrome    ??? Hematuria, microscopic       Past Surgical History   Procedure Laterality Date   ??? Cystoscopy  ???     x 13   ??? Lithotripsy  2006   ??? Hx vascular access  July, 2010     [place]   ??? Hx vascular access Right 2013     pt has [facility]   ??? Hx appendectomy  ???   ??? Hx tonsillectomy     ??? Hx cesarean section     ??? Hx tubal ligation     ??? Hx ovarian cyst removal       on R     Family History   Problem Relation Age of Onset   ??? Bleeding Prob Father      Factor V   ??? Cancer Maternal Grandmother      metastatic breast CA.   ??? Ovarian Cancer Mother [id number]     passed away due to ca   ??? Bipolar Disorder Mother    ??? Kidney Disease Brother      kidney stones      History   Substance Use Topics   ??? Smoking status: Never Smoker    ??? Smokeless tobacco: Never Used   ??? Alcohol Use: Yes      Comment: rarely - only drinks 2 times per year        Current Facility-Administered Medications   Medication Dose Route Frequency Provider Last Rate Last Dose   ??? heparin (porcine) pf 300 Units  300 Units InterCATHeter PRN Morene CrockerJames David Rice, MD   300 Units at 05/23/14 0905   ??? lidocaine (XYLOCAINE) 10 mg/mL (1 %) injection 0.1 mL  0.1 mL SubCUTAneous PRN Leonette MonarchJames R Simril, MD       ??? QUEtiapine (SEROquel) tablet 300 mg  300 mg Oral QHS Morene CrockerJames David Rice, MD   300 mg at 05/22/14 2209   ??? butalbital-acetaminophen-caffeine (FIORICET, ESGIC) per tablet 1 Tab  1 Tab Oral Q4H PRN Morene CrockerJames David Rice, MD       ??? clonazePAM (KlonoPIN) tablet 1 mg  1 mg Oral TID Morene CrockerJames David Rice, MD   1 mg at  05/23/14 1539   ??? DULoxetine (CYMBALTA) capsule 30 mg  30 mg Oral BID Morene Crocker, MD   30 mg at 05/23/14 0827   ??? pantoprazole (PROTONIX) tablet 40 mg  40 mg Oral ACB&D Morene Crocker, MD   40 mg at 05/23/14 1539   ??? phenazopyridine (PYRIDIUM) tablet 200 mg  200 mg Oral TID PRN Morene Crocker, MD       ??? tamsulosin [facility]) capsule 0.4 mg  0.4 mg Oral DAILY Morene Crocker, MD   0.4 mg at 05/23/14 0827   ??? warfarin (COUMADIN) tablet 5 mg  5 mg Oral QHS Morene Crocker, MD   5 mg at 05/22/14 2209   ??? zolpidem (AMBIEN) tablet 5 mg  5 mg Oral QHS PRN Morene Crocker, MD       ??? sodium chloride (NS) flush 5-10 mL  5-10 mL IntraVENous Q8H Morene Crocker, MD   10 mL at 05/23/14 0600   ??? sodium chloride (NS) flush 5-10 mL  5-10 mL IntraVENous PRN Morene Crocker, MD       ??? dextrose 5% lactated ringers infusion  150 mL/hr IntraVENous CONTINUOUS Morene Crocker, MD 150 mL/hr at 05/23/14 0322 150 mL/hr at 05/23/14 0322   ??? ciprofloxacin HCl (CIPRO) tablet 500 mg  500 mg Oral Q12H Morene Crocker, MD   500 mg at 05/23/14 0827   ??? acetaminophen (TYLENOL) tablet 650 mg  650 mg Oral Q4H PRN Morene Crocker, MD       ??? oxyCODONE-acetaminophen (PERCOCET 10) 10-325 mg per tablet 1 Tab  1 Tab Oral Q4H PRN Morene Crocker, MD   1 Tab at 05/23/14 1539    ??? oxyCODONE IR (ROXICODONE) tablet 10 mg  10 mg Oral Q4H PRN Morene Crocker, MD   10 mg at 05/23/14 1250   ??? naloxone [facility]) injection 0.4 mg  0.4 mg IntraVENous PRN Morene Crocker, MD       ??? oxybutynin (DITROPAN) tablet 5 mg  5 mg Oral Q6H PRN Morene Crocker, MD   5 mg at 05/22/14 1152   ??? ondansetron (ZOFRAN ODT) tablet 4 mg  4 mg Oral Q4H PRN Morene Crocker, MD   4 mg at 05/22/14 1150   ??? ondansetron (ZOFRAN) injection 4 mg  4 mg IntraVENous Q4H PRN Morene Crocker, MD   4 mg at 05/23/14 0827   ??? promethazine (PHENERGAN) injection 12.5 mg  12.5 mg IntraVENous Q6H PRN Morene Crocker, MD       ??? promethazine (PHENERGAN) tablet 25 mg  25 mg Oral Q6H PRN Morene Crocker, MD       ??? promethazine (PHENERGAN) suppository 25 mg  25 mg Rectal Q6H PRN Morene Crocker, MD       ??? diphenhydrAMINE (BENADRYL) injection 12.5 mg  12.5 mg IntraVENous Q4H PRN Morene Crocker, MD       ??? diphenhydrAMINE (BENADRYL) capsule 25 mg  25 mg Oral Q4H PRN Morene Crocker, MD       ??? magnesium hydroxide (MILK OF MAGNESIA) oral suspension 30 mL  30 mL Oral DAILY PRN Morene Crocker, MD       ??? docusate sodium (COLACE) capsule 100 mg  100 mg Oral BID Morene Crocker, MD   100 mg at 05/23/14 0827   ??? bisacodyl (DULCOLAX) tablet 5 mg  5 mg Oral DAILY PRN Morene Crocker, MD       ???  bisacodyl (DULCOLAX) suppository 10 mg  10 mg Rectal DAILY PRN Morene Crocker, MD       ??? SUMAtriptan (IMITREX) tablet 100 mg  100 mg Oral DAILY PRN Morene Crocker, MD       ??? SUMAtriptan (IMITREX) tablet 100 mg  100 mg Oral DAILY PRN Morene Crocker, MD       ??? HYDROmorphone (PF) (DILAUDID) injection 2 mg  2 mg IntraVENous Q3H PRN Morene Crocker, MD   2 mg at 05/23/14 0827        Allergies   Allergen Reactions   ??? Ativan [Lorazepam] Itching   ??? Codeine Rash   ??? Demerol [Meperidine] Itching     *     ??? Hydrocodone-Ibuprofen Unknown (comments)   ??? Iodinated Contrast Media - Iv Dye Rash      States also has swelling, and allergic to shellfish   ??? Ketorolac Tromethamine Rash   ??? Motrin [Ibuprofen] Other (comments)     Causes stomach upset after 3 doses.   ??? Pcn [Penicillins] Hives   ??? Pneumococcal Vaccine Swelling   ??? Pneumovax 23 [Pneumococcal 23-Val Ps Vaccine] Other (comments)     Local reaction   ??? Shellfish Containing Products Rash        Review of Systems:  A comprehensive review of systems was negative except for that written in the History of Present Illness.     Objective:     Intake and Output:       04/10 1901 - 04/12 0700  In: 3693 [P.O.:720; I.V.:2973]  Out: 350 [Urine:350]    Physical Exam:   BP 125/71 mmHg   Pulse 92   Temp(Src) 98.2 ??F (36.8 ??C)   Resp 17   SpO2 95%   LMP 07/28/2004  General appearance: alert, cooperative, mild distress, appears stated age, anxious  Extremities: decreased ROM to left lower extremity; deformity noted to left ankle; swelling to left ankle; ecchymosis noted over lateral aspect of the left foot and ankle; patient is tender to palpation over the left distal tibia and fibula; normal sensation; n/v intact  Pulses: 2+ and symmetric  Skin: Skin color, texture, turgor normal. No rashes or lesions  Neurologic: Alert and oriented X 3, normal strength and tone. Normal symmetric reflexes.     Data Review:   No results found for this or any previous visit (from the past 24 hour(s)).    X-RAYS OF LEFT TIBIA AND FIBULA REVIEWED   There is a comminuted fracture of the distal shaft of the tibia. No  displacement. There is a comminuted fracture of the distal fibula. The main  distal fracture fragment is displaced posteriorly greater than 100% of the width  of the shaft. There is mild overriding.  ??       Assessment:     Principal Problem:    Hydronephrosis (05/22/2014)      Overview: Date of Procedure: 05/21/2014       Preoperative Diagnosis: RIGHT URETERAL STONE      Postoperative Diagnosis: RIGHT URETERAL STONE??       Procedure(s):       CYSTOSCOPY, RIGHT URETEROSCOPY, DIFFICULT RIGHT STENT PLACEMENT    Active Problems:    Ureteral stone (05/19/2014)      Renal colic (05/20/2014)        Plan:     LEFT DISTAL TIBIA AND FIBULA FRACTURE  WILL PLACE PATIENT IN WEBER SPLINT  PATIENT IS STRICT NWB - WILL ORDER CRUTCHES  WILL REQUIRE ORIF  LEFT DISTAL TIBIA AND FIBULA - THIS CAN NOT BE DONE FOR 5-7 DAYS DUE TO SWELLING  CAN BE DISCHARGED HOME WITH OFFICE FOLLOW-UP TO DISCUSS SURGERY    ADDENDUM - WEBER SPLINT APPLIED AFTER PATIENT MEDICATED WITH 2 MG OF DILAUDID.  PATIENT TOLERATED FAIR.  I ADJUSTED THE CRUTCHES TO FIT THE PATIENT.  PATIENT IS REQUESTING TO STAY FOR PAIN CONTROL.  I EXPLAINED TO THE PATIENT THAT SHE WAS NOT UNDER MY CARE AND THAT NORMALLY THIS INJURY WAS SENT HOE FORM THE ER.  I SPOKE TO THE PRIMARY RN AND COORDINATOR REGARDING PLAN OF CARE.      Signed By: Ashley Mariner, NP     May 23, 2014

## 2014-05-23 NOTE — Progress Notes (Signed)
Per nurse, pt fell in hall.  Xray shows left tib-fib fx.  Have consulted Dr. Maud Deed'Boyle.

## 2014-05-23 NOTE — Progress Notes (Signed)
Pt found lying on floor beside nurse's station hollering, stating "I fell" , fall was unheard and unwitnessed, pt c/o severe left leg pain from knee down, left leg is noted to be swollen and left foot is slightly internally rotated, pt put in wheelchair and taken back to room, VS obtained, pt is hypertensive with SBP 160's, posey bed alarm placed and pt instructed not to get OOB, primary RN Lafonda Mossesiana notified, Dr. Merlinda FrederickMonroe's office called, order received for xray left hip/leg.

## 2014-05-23 NOTE — Progress Notes (Signed)
Patient was found by nursing staff on the floor in the hallway sating, "i just fell walking towards the nurses station." Patient was assisted to a wheelchair and taken back to her room, VS, patient complained of left leg pain. This nurse noted LLE swelling, Corrie DandyMary, RN notified Dr. Archie PattenMonroe orders given to X-ray LLE.   Patient Vitals for the past 8 hrs:   Temp Pulse Resp BP SpO2   05/23/14 1431 98.2 ??F (36.8 ??C) 92 17 125/71 mmHg 95 %   05/23/14 1034 98 ??F (36.7 ??C) (!) 103 18 132/78 mmHg 94 %   05/23/14 0935 - 100 17 162/89 mmHg 94 %

## 2014-05-23 NOTE — Discharge Summary (Signed)
[facility]                            [address]                           [place] [id number]                                [phone]                                DISCHARGE SUMMARY    NAME: Krista Lopez, Krista D                          MR:  [id number]  LOC: 6MS 626 01             SEX:  F               ACCT:  [id number]  DOB: 12-23-1965             AGE:  49              PT:  I  ADMIT: 05/19/2014           DSCH:                 MSV:        DISCHARGE DIAGNOSES:  1. Right hydronephrosis.  2. Right renal colic.    PROCEDURES DURING ADMISSION:  1. Cystoscopy.  2. Placement of right double-J ureteral stent on 05/21/2014.    HISTORY OF ILLNESS: This 49 year old female status post right  ureteroscopy and laser lithotripsy and stent placement on 05/15/2014. At  that time she had a 1.1 cm stone in the right mid ureter. She has had  numerous stones going back to age 49. On 05/19/2014, she had a followup  in the office. KUB did not show any obvious stones and her stent was  removed at that time. Later that day, she came back to the ER having  severe pain in the right flank. She was admitted for pain control.    PAST MEDICAL HISTORY: Factor V left Leiden mutation, pulmonary embolism  in the past, bipolar disorder, anxiety.    PAST SURGERY: Includes numerous lithotripsies, history of subclavian port  for vascular abscess, status post appendectomy, status post tubal  ligation, status post ovarian cyst removal.    MEDICATIONS: See [facility].    ALLERGIES: SEE [facility].    PHYSICAL EXAMINATION  LUNGS: Clear.  HEART: Regular rate and rhythm.  ABDOMEN: She appears to be in distress with right flank pain.    HOSPITAL COURSE: She was admitted for pain control. CT scan was done and  showed severe hydronephrosis on the right side, but no stones. She was  taken to the OR on 05/21/2014. Had difficult stent placement secondary to  edema of the right ureterovesical junction.     The following day, she continued to have pain which was too severe for  discharge. Her Foley catheter was removed. She did better after that and  was discharged home on 05/23/2014. She is to return to the office in 1  week for cystoscopy and stent placement.  Ivan Croft, MD                This is an unverified document unless signed by physician.    TID: [id number]                                       DT: 05/23/2014 01:10 P  JOB: [id number]            DOC#: [id number]            DD: 05/23/2014    cc:   Ivan Croft, MD

## 2014-05-23 NOTE — Progress Notes (Signed)
Dc cancelled secondary to fall. [facility]  Delsa SaleSharon E. Ralph Leydenoyle, LISW

## 2014-05-23 NOTE — Progress Notes (Signed)
Discharge instructions and prescriptions given and reviewed with pt, verbalizes understanding, medication side effect sheet reviewed with pt, pt to be discharged home, pt states ride wont be available until 1500.

## 2014-05-24 NOTE — Home Health (Signed)
Referral received for [facility] services, PT for strengthening, safety. Nurse Liaison unable to contact pt, message left to return call and emergency contact # invalid.  Unable to confirm address and phone numbers, although pt's name was on VM message. [facility].

## 2014-05-24 NOTE — Progress Notes (Signed)
Dr. Archie PattenMonroe kindly agrees to follow home Care for patient. Requesting home PT safety evaluation.   [facility] updated and they will get in contact with patient. Faxed F2F to Dr. Merlinda FrederickMonroe's office.   Delsa SaleSharon E. Ralph Leydenoyle, LISW

## 2014-05-24 NOTE — Progress Notes (Signed)
[facility]  Home Care  Face to Face Encounter    Patient???s Name: Krista AgeeShannon Machuca    Date of Birth: 10/12/1965    Ordering Physician: Dr. Archie PattenMonroe    Primary Diagnosis: post op pain control  post op pain control  RIGHT URETERAL STONE  Renal colic    Date of Face to Face:   05/24/2014                                  Face to Face Encounter findings are related to primary reason for home care:   yes.     1. I certify that the patient needs intermittent care as follows: physical therapy: transfer training, mobility in the home. Maintain non weight bearing status.     2. I certify that this patient is homebound, that is: 1) patient requires the use of a crutches device, special transportation, or assistance of another to leave the home; or 2) patient's condition makes leaving the home medically contraindicated; and 3) patient has a normal inability to leave the home and leaving the home requires considerable and taxing effort.  Patient may leave the home for infrequent and short duration for medical reasons, and occasional absences for non-medical reasons. Homebound status is due to the following functional limitations: Patient currently under activity restrictions secondary to recent surgical procedure, this hinders their ability to safely leave the home.   NON WEIGHT BEARING    3. I certify that this patient is under my care and that I, or a nurse practitioner or physician???s assistant, or clinical nurse specialist, or certified nurse midwife, working with me, had a Face-to-Face Encounter that meets the physician Face-to-Face Encounter requirements.  The following are the clinical findings from the Face-to-Face encounter that support the need for skilled services and is a summary of the encounter:DC summary    See discharge summary      Sarina IllSharon E Doyle, LISW  05/24/2014      THE FOLLOWING TO BE COMPLETED BY THE COMMUNITY PHYSICIAN:    I concur with the findings described above from the F2F encounter that  this patient is homebound and in need of a skilled service.    Certifying Physician: _____________________________________      Printed Certifying Physician Name: _____________________________________    Date: _________________

## 2014-05-25 NOTE — Progress Notes (Signed)
Transition of Care Discharge Follow-up Questionnaire   Date/Time of Call:   05/25/14 5:44p    What was the patient hospitalized for? Patient was hospitalized for post op pain control.    Does the patient understand his/her diagnosis and/or treatment and what happened during the hospitalization?     Patient verbalized understanding of treatment and diagnosis. Indicated by patient she had kidney stones and had a stent placed.    Did the patient receive discharge instructions? Patient verbalized she received discharge instructions.   Review any discharge instructions (see notes in [facility]).  Ask patient if they understand these.  Do they have any questions?     Care coordinator and patient reviewed discharge instructions per connect care. Patient verbalized understanding of instructions.      Were home services ordered (nursing, PT, OT, ST, etc.)? Patient stated home health nursing and PT services were ordered.       If so, has the first visit occurred? If not, why? (Assist with coordination of services if necessary.) Patient indicated home health nursing services have occurred.     Was any DME ordered? Indicated by patient, no DME was ordered but she left hospital in crutches.     If so, has it been received?  If not, why?  (Assist with coordination of arranging DME orders if necessary.) n/a   Complete a review of all medications (new, continued and discontinued meds per the D/C instructions and medication tab in [facility]). Care coordinator and patient reviewed all medications per [facility]. Care coordinator inquired if new prescriptions were causing any side effects. Indicated by patient, no reactions have occurred from any new medications. Care coordinator encouraged patient to call PCP if reactions do occur.        Were all new prescriptions filled?  If not, why?  (Assist with obtainment of medications if necessary.)  Patient verbalized all prescriptions are filled.           Does the patient understand the purpose and dosing instructions for all medications?  (If patient has questions, provide explanation and education.) Indicated by patient to care coordinator, the purpose and dosing instructions for all medications were understood. Patient verbalized to care coordinator pain medication was not effective.   Does the patient have any problems in performing ADL???s? (If patient is unable to perform ADLs ??? what is the limiting factor(s)?  Do they have a support system that can assist? If no support system is present, discuss possible assistance that they may be able to obtain.) Indicated by patient, she can not move and is in pain. Patient verbalized she is unable to perform ADL's.            Does the patient have all follow-up appointments scheduled?  Has transportation been arranged?  [facility] follow-up should be within 7 days of discharge; all others should have PCP follow-up within 7 days of discharge; follow-ups with other specialists as appropriate or ordered.) Patient stated she has follow-up surgery scheduled for left tib-fib fx next week. Patient verbalized to care coordinator she has a new PCP appointment on 06/15/14. Transportation is arranged.    Any other questions or concerns expressed by the patient?     Patient verbalized a pain score of "50" out of 0-10 scale. Patient stated to care coordinator that she "hurts all over!" Patient started to cry on phone with care coordinator.  Care coordinator instructed patient to go to ER if pain is unbearable.  Patient explained to care coordinator  that she was taking 2 percocet prn with no pain relief. Patient stated, "I need something stronger! I'm going back to the ER!" Patient stated she was [facility] off [facility]!"   TOC Call Completed By: Regenia SkeeterAlicia Rohan, LPN  [facility]  Care Coordinator

## 2014-05-26 NOTE — Telephone Encounter (Signed)
Pt told them she didn't need home care

## 2014-05-26 NOTE — Telephone Encounter (Signed)
Home Health nurse calling and she has orders to go see her. Pt has fallen and broken her tib fib. When she called and set up a time to see her she said she had fallen several more times going to the bathroom. Nurse would like orders to temp cath her to help until her surgery next week to stabilize her leg.  Please call her, Thank you.

## 2014-05-29 ENCOUNTER — Inpatient Hospital Stay
Admit: 2014-05-29 | Discharge: 2014-05-29 | Disposition: A | Discharge status: Home / Self Care | Payer: MEDICARE | Attending: Emergency Medicine

## 2014-05-29 ENCOUNTER — Encounter: Payer: MEDICARE | Attending: Urology | Primary: Student in an Organized Health Care Education/Training Program

## 2014-05-29 ENCOUNTER — Emergency Department: Admit: 2014-05-29 | Payer: MEDICARE | Primary: Student in an Organized Health Care Education/Training Program

## 2014-05-29 DIAGNOSIS — S82202A Unspecified fracture of shaft of left tibia, initial encounter for closed fracture: Secondary | ICD-10-CM

## 2014-05-29 MED ORDER — HYDROMORPHONE (PF) 1 MG/ML IJ SOLN
1 mg/mL | INTRAMUSCULAR | Status: AC
Start: 2014-05-29 — End: 2014-05-29

## 2014-05-29 MED ORDER — HYDROMORPHONE (PF) 1 MG/ML IJ SOLN
1 mg/mL | INTRAMUSCULAR | Status: AC
Start: 2014-05-29 — End: 2014-05-29
  Administered 2014-05-29: 06:00:00 via INTRAMUSCULAR

## 2014-05-29 MED FILL — HYDROMORPHONE (PF) 1 MG/ML IJ SOLN: 1 mg/mL | INTRAMUSCULAR | Qty: 1

## 2014-05-29 NOTE — ED Notes (Signed)
I have reviewed discharge instructions with the patient.  The patient verbalized understanding.  [facility] will be called for pt. Pt will be waiting in lobby.

## 2014-05-29 NOTE — Telephone Encounter (Signed)
Please schedule below, thanks!

## 2014-05-29 NOTE — ED Notes (Signed)
Pt in via [facility] c/o left foot pain. Pt with known fracture. Pt scheduled to surgery on Wednesday. Pt states she is unable to get pain under control. Pt states spoke with surgeon and was told to come to ED. Pt attempted oxycodone without relief.

## 2014-05-29 NOTE — Telephone Encounter (Signed)
Make appt in 1-2 weeks for cysto, stent removal. I can't tell by the op note whether she has a string or not

## 2014-05-29 NOTE — ED Notes (Signed)
Confirmation number: [id number]

## 2014-05-29 NOTE — Telephone Encounter (Signed)
Jasmine DecemberSharon do I need to call this patient to get them back in asap? It's a week f/u Cysto with stent removal. Is this patient still in the hospital?

## 2014-05-29 NOTE — Telephone Encounter (Signed)
Patient was a no show for visit on 05/29/2014 for Cysto.  Please review.

## 2014-05-29 NOTE — Telephone Encounter (Signed)
I called Krista Lopez about her stent. She had an  appt this morning for cysto/stent removal. She call 911 @ 3 am because of severe pain in her foot. The ER doctor told her that she had actually broken another bone that wasn't originally broken completely. She is scheduled for surgery with Dr. Maud Deed'Boyle on Wednesday. I told her she could come tomorrow but she said she was told "NO WEIGHT AT ALL ON FOOT".  I offered her Monday. She said she will have to be off her foot for 3 months. At this point, I told her I will check with Dr. Archie PattenMonroe and call her back.

## 2014-05-29 NOTE — Telephone Encounter (Signed)
Pt calling and she would like to talk to nurse. She is crying. She states her stent is killing her but there is no way that she can get in to office.  She is having surgery on her foot Wednesday.  Please call her Thank you.

## 2014-05-29 NOTE — ED Provider Notes (Signed)
HPI Comments: Patient was admitted to the hospital and discharged Wednesday for kidney stone and stent placement and chronic abdominal pain.  On discharge she fell and fractured her left tibia and fibula.  She is placed in a splint is going to surgery with Dr. Maud Deed'Boyle on Wednesday.  Taking her oxycodone at home but still having a lot of pain.    Patient is a 49 y.o. female presenting with foot pain. [name] No language interpreter was used.   Foot Pain   This is a new problem. The current episode started more than 2 days ago. The problem occurs constantly. The problem has not changed since onset.The pain is present in the left foot, left ankle and left lower leg. The quality of the pain is described as sharp. The pain is moderate. Associated symptoms include limited range of motion. Pertinent negatives include no numbness, no back pain and no neck pain. The symptoms are aggravated by movement and palpation. She has tried nothing for the symptoms. There has been a history of trauma.        Past Medical History:   Diagnosis Date   ??? Factor V Leiden mutation (HCC)    ??? Infectious disease 02/2009      Hx MRSA / E.Coli bacteremia, MRSA wound infection 2/2 port   ??? Factor II deficiency (HCC)    ??? Calculus of kidney    ??? Depression    ??? Iron deficiency    ??? Migraines    ??? 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 Bipolar disorder (HCC)    ??? Anxiety    ??? Restless leg syndrome    ??? Hematuria, microscopic        Past Surgical History:   Procedure Laterality Date   ??? Cystoscopy  ???     x 13   ??? Lithotripsy  2006   ??? Hx vascular access  July, 2010     [place]   ??? Hx vascular access Right 2013     pt has current rt subcl[facility] port - [facility]   ??? Hx appendectomy  ???   ??? Hx tonsillectomy     ??? Hx cesarean section     ??? Hx tubal ligation     ??? Hx ovarian cyst removal       on R          Family History:   Problem Relation Age of Onset   ??? Bleeding Prob Father      Factor V   ??? Cancer Maternal Grandmother      metastatic breast CA.   ??? Ovarian Cancer Mother [id number]     passed away due to ca   ??? Bipolar Disorder Mother    ??? Kidney Disease Brother      kidney stones       History     Social History   ??? Marital Status: DIVORCED     Spouse Name: N/A   ??? Number of Children: N/A   ??? Years of Education: N/A     Occupational History   ??? Not on file.     Social History Main Topics   ??? Smoking status: Never Smoker    ??? Smokeless tobacco: Never Used   ??? Alcohol Use: Yes      Comment: rarely - only drinks 2 times per year   ???  Drug Use: No      Comment: narcotic seeking per hospital hx.   ??? Sexual Activity:     Partners: Male     Birth Control/ Protection: Surgical     Other Topics Concern   ??? Not on file     Social History Narrative           ALLERGIES: Ativan; Codeine; Demerol; Hydrocodone-ibuprofen; Iodinated contrast media - iv dye; Ketorolac tromethamine; Motrin; Pcn; Pneumococcal vaccine; Pneumovax 23; and Shellfish containing products      Review of Systems   Constitutional: Negative for fever and chills.   Eyes: Negative for pain and redness.   Respiratory: Negative for chest tightness, shortness of breath and wheezing.    Cardiovascular: Positive for leg swelling. Negative for chest pain.   Gastrointestinal: Negative for nausea, vomiting, abdominal pain and diarrhea.   Musculoskeletal: Negative for back pain, gait problem, neck pain and neck stiffness.   Skin: Negative for color change, rash and wound.   Neurological: Negative for weakness, numbness and headaches.       Filed Vitals:    05/29/14 0111   BP: 139/94   Pulse: 114   Temp: 99.1 ??F (37.3 ??C)   Resp: 18   Height:  (1.626 m)   Weight: 68.493 kg (151 lb)   SpO2: 98%            Physical Exam   Constitutional: She is oriented to person, place, and time. She appears well-developed and well-nourished. No distress.   HENT:    Head: Normocephalic and atraumatic.   Neck: Normal range of motion. Neck supple.   Cardiovascular: Normal rate and regular rhythm.    No murmur heard.  Pulmonary/Chest: Effort normal and breath sounds normal. She has no wheezes.   Abdominal: Soft. Bowel sounds are normal. There is no tenderness.   Musculoskeletal: She exhibits edema and tenderness.        Legs:  Removed splint and no fracture blisters. Appears well.    Neurological: She is alert and oriented to person, place, and time.   Skin: Skin is warm and dry.   Nursing note and vitals reviewed.       MDM  Number of Diagnoses or Management Options  Diagnosis management comments: Splint replaced and fracture stable.        Amount and/or Complexity of Data Reviewed  Tests in the radiology section of CPT??: ordered and reviewed  Tests in the medicine section of CPT??: ordered and reviewed    Risk of Complications, Morbidity, and/or Mortality  Presenting problems: moderate  Diagnostic procedures: moderate  Management options: moderate    Patient Progress  Patient progress: stable      Procedures    Left distal fib and tib fracture. Foot no acute.

## 2014-05-30 ENCOUNTER — Inpatient Hospital Stay: Primary: Student in an Organized Health Care Education/Training Program

## 2014-05-30 NOTE — Telephone Encounter (Signed)
I spoke to Dr Maud Deed'boyle office and they will call back with Surgery date and time.

## 2014-05-30 NOTE — Progress Notes (Signed)
[facility] referral from ED - I have previously spoken with Ms. Sarabia before to try to assist with any social/medical issues she may have concerns about, but she has not been very receptive to this point. I did contact her this morning to see what I could assist her with and her main complaint at this time is uncontrolled pain from her left tib/fib fracture. She was seen in ED yesterday it looks like and she says no one will give her adequate pain medication for this fracture and that "no one cares that I am sick from this pain". She tells me she [place], she says she has never met Dr. Judithann Sheen who she is told will do the surgery and she would like to know what is going to happen and what time surgery actually is scheduled for. She says she was told her surgery would be this Wednesday at 9:00 a.m., but the Medicaid Lucianne Lei is not set to pick her up until 7:30-7:40a.m. In the morning and she is worried she will not arrive on time. She tells me she was taking Oxycodone $RemoveBeforeDE'5mg'uKpbelBYlnZxVTB$  for pain 1-2 every 4-6 hours and that it really did not help the pain very much. She reports her pain level today is a 10 and I can barely understand her because of the excessive crying on the phone. She tells me that she has no reliable transportation currently other than the Medicaid Lucianne Lei, she lives with a roommate who is 14 years old and that he is like a "Godfather to her" but he is limited on what he can do. She ambulates with crutches, but says she is told to be non weight bearing for next 3 months and she thinks she will need a wheelchair for home after surgery and better pain control. I did tell her I would call Dr. Nelly Laurence office to see what information I could find out for her about the surgery. I will also see if our social worker can assist with anything to be sure all the bases are covered.       I did call Dr. Nelly Laurence office and left a message for his nurse to see if  I can clarify the surgery time and instructions.

## 2014-05-30 NOTE — Telephone Encounter (Signed)
I called pt to schedule appt and offered to schedule her cysto in 2-3 weeks and she refused she states it needs to come out now. Pt was upset and yelling. Please call her.

## 2014-05-30 NOTE — Telephone Encounter (Addendum)
I spoke to Krista Lopez at Dr Chester HolsteinBoyles office and the patient is scheduled for tomorrow but the time is still up in the air.  She is supposed to be the third case for ortho.  They told her to be at the hospital at 9 am since she has to arrange a ride.  [facility] and we are going to see if she can come here at 8 am for stent removal.    I called the patient and she started yelling at me.  She said I don't know what I am talking about the hospital told her to be there at 5 in the morning.  I explained that Krista Landngela is on the other line with me and states she does not have to be there until 9 am.  She told me that I did not know what I was talking about.  She said that her ride was arranged it is already a mess.  She will be at the hospital at 5 and she will have her surgery.  I told her that it is very important that she has this stent removed.  She told me that she tried to attempt getting here and no one will do it for her.  I explained that she will just need to call me after her foot surgery and we will get her in for stent removal.  She told me that I needed to make arrangements for her to have it out and it is my responsibility to take care of this.  I explained that she will need to call after her orthopedic surgery.  rli

## 2014-05-30 NOTE — Progress Notes (Signed)
Received a call back from Dr.O'Boyle's office - spoke with Marylene LandAngela who did confirm Ms. Hert was set to have surgery tomorrow but a final time for the surgery to start is still pending due to her transportation issues. Ms. Petra KubaMisanik was told to be at the hospital by 9:00a.m. Tomorrow.  Marylene Landngela states that Dr. Maud Deed'Boyle will speak to her before the surgery and a plan will be explained to her in detail.     I did contact Ms. Dearman to inform her of this plan.

## 2014-05-31 ENCOUNTER — Ambulatory Visit: Admit: 2014-05-31 | Payer: MEDICARE | Primary: Student in an Organized Health Care Education/Training Program

## 2014-05-31 ENCOUNTER — Inpatient Hospital Stay: Payer: MEDICARE

## 2014-05-31 MED ORDER — ONDANSETRON (PF) 4 MG/2 ML INJECTION
4 mg/2 mL | INTRAMUSCULAR | Status: DC | PRN
Start: 2014-05-31 — End: 2014-06-01
  Administered 2014-06-01 (×2): via INTRAVENOUS

## 2014-05-31 MED ORDER — CLONAZEPAM 1 MG TAB
1 mg | Freq: Three times a day (TID) | ORAL | Status: DC
Start: 2014-05-31 — End: 2014-06-01
  Administered 2014-05-31 – 2014-06-01 (×3): via ORAL

## 2014-05-31 MED ORDER — ACETAMINOPHEN 325 MG TABLET
325 mg | Freq: Three times a day (TID) | ORAL | Status: DC
Start: 2014-05-31 — End: 2014-06-01
  Administered 2014-05-31 – 2014-06-01 (×3): via ORAL

## 2014-05-31 MED ORDER — OXYCODONE 5 MG TAB
5 mg | ORAL | Status: DC | PRN
Start: 2014-05-31 — End: 2014-06-01

## 2014-05-31 MED ORDER — OXYCODONE 5 MG TAB
5 mg | ORAL | Status: DC | PRN
Start: 2014-05-31 — End: 2014-06-01
  Administered 2014-05-31 – 2014-06-01 (×5): via ORAL

## 2014-05-31 MED ORDER — DULOXETINE 30 MG CAP, DELAYED RELEASE
30 mg | Freq: Two times a day (BID) | ORAL | Status: DC
Start: 2014-05-31 — End: 2014-06-01
  Administered 2014-05-31 – 2014-06-01 (×2): via ORAL

## 2014-05-31 MED ORDER — ONDANSETRON (PF) 4 MG/2 ML INJECTION
4 mg/2 mL | INTRAMUSCULAR | Status: AC
Start: 2014-05-31 — End: ?

## 2014-05-31 MED ORDER — ZOLPIDEM 5 MG TAB
5 mg | Freq: Every evening | ORAL | Status: DC | PRN
Start: 2014-05-31 — End: 2014-06-01
  Administered 2014-06-01: 02:00:00 via ORAL

## 2014-05-31 MED ORDER — DIPHENHYDRAMINE HCL 50 MG/ML IJ SOLN
50 mg/mL | Freq: Once | INTRAMUSCULAR | Status: AC
Start: 2014-05-31 — End: 2014-05-31
  Administered 2014-05-31: 13:00:00 via INTRAVENOUS

## 2014-05-31 MED ORDER — FENTANYL CITRATE (PF) 50 MCG/ML IJ SOLN
50 mcg/mL | INTRAMUSCULAR | Status: AC
Start: 2014-05-31 — End: ?

## 2014-05-31 MED ORDER — SODIUM CHLORIDE 0.9 % IJ SYRG
INTRAMUSCULAR | Status: DC | PRN
Start: 2014-05-31 — End: 2014-06-01
  Administered 2014-06-01: 14:00:00 via INTRAVENOUS

## 2014-05-31 MED ORDER — BUTALBITAL-ACETAMINOPHEN-CAFFEINE 50 MG-325 MG-40 MG TAB
50-325-40 mg | ORAL | Status: DC | PRN
Start: 2014-05-31 — End: 2014-06-01

## 2014-05-31 MED ORDER — LACTATED RINGERS IV
INTRAVENOUS | Status: DC
Start: 2014-05-31 — End: 2014-05-31
  Administered 2014-05-31 (×2): via INTRAVENOUS

## 2014-05-31 MED ORDER — FENTANYL CITRATE (PF) 50 MCG/ML IJ SOLN
50 mcg/mL | INTRAMUSCULAR | Status: DC | PRN
Start: 2014-05-31 — End: 2014-05-31
  Administered 2014-05-31 (×5): via INTRAVENOUS

## 2014-05-31 MED ORDER — EPINEPHRINE (PF) 1 MG/ML INJECTION
11 mg/mL ( mL) | INTRAMUSCULAR | Status: DC | PRN
Start: 2014-05-31 — End: 2014-05-31
  Administered 2014-05-31: 14:00:00 via PERINEURAL

## 2014-05-31 MED ORDER — SODIUM CHLORIDE 0.9 % IJ SYRG
Freq: Three times a day (TID) | INTRAMUSCULAR | Status: DC
Start: 2014-05-31 — End: 2014-06-01
  Administered 2014-05-31 – 2014-06-01 (×3): via INTRAVENOUS

## 2014-05-31 MED ORDER — SUMATRIPTAN 50 MG TAB
50 mg | Freq: Once | ORAL | Status: DC | PRN
Start: 2014-05-31 — End: 2014-06-01

## 2014-05-31 MED ORDER — PROPOFOL 10 MG/ML IV EMUL
10 mg/mL | INTRAVENOUS | Status: DC | PRN
Start: 2014-05-31 — End: 2014-05-31
  Administered 2014-05-31: 15:00:00 via INTRAVENOUS

## 2014-05-31 MED ORDER — DIPHENHYDRAMINE HCL 50 MG/ML IJ SOLN
50 mg/mL | INTRAMUSCULAR | Status: DC
Start: 2014-05-31 — End: 2014-05-31
  Administered 2014-05-31: 14:00:00

## 2014-05-31 MED ORDER — HYDROMORPHONE (PF) 1 MG/ML IJ SOLN
1 mg/mL | INTRAMUSCULAR | Status: DC | PRN
Start: 2014-05-31 — End: 2014-06-01
  Administered 2014-06-01 (×3): via INTRAVENOUS

## 2014-05-31 MED ORDER — LIDOCAINE (PF) 20 MG/ML (2 %) IV SYRINGE
100 mg/5 mL (2 %) | INTRAVENOUS | Status: AC
Start: 2014-05-31 — End: ?

## 2014-05-31 MED ORDER — PHENYLEPHRINE 10 MG/ML INJECTION
10 mg/mL | INTRAMUSCULAR | Status: DC | PRN
Start: 2014-05-31 — End: 2014-05-31
  Administered 2014-05-31: 15:00:00 via INTRAVENOUS

## 2014-05-31 MED ORDER — PANTOPRAZOLE 40 MG TAB, DELAYED RELEASE
40 mg | Freq: Two times a day (BID) | ORAL | Status: DC
Start: 2014-05-31 — End: 2014-06-01
  Administered 2014-05-31 – 2014-06-01 (×3): via ORAL

## 2014-05-31 MED ORDER — LACTATED RINGERS IV
INTRAVENOUS | Status: DC
Start: 2014-05-31 — End: 2014-05-31

## 2014-05-31 MED ORDER — LIDOCAINE HCL 1 % (10 MG/ML) IJ SOLN
10 mg/mL (1 %) | INTRAMUSCULAR | Status: DC | PRN
Start: 2014-05-31 — End: 2014-05-31

## 2014-05-31 MED ORDER — ENOXAPARIN 30 MG/0.3 ML SUB-Q SYRINGE
30 mg/0.3 mL | SUBCUTANEOUS | Status: DC
Start: 2014-05-31 — End: 2014-06-01
  Administered 2014-06-01: 13:00:00 via SUBCUTANEOUS

## 2014-05-31 MED ORDER — PROMETHAZINE 25 MG/ML INJECTION
25 mg/mL | INTRAMUSCULAR | Status: DC | PRN
Start: 2014-05-31 — End: 2014-05-31
  Administered 2014-05-31: 18:00:00 via INTRAVENOUS

## 2014-05-31 MED ORDER — FENTANYL CITRATE (PF) 50 MCG/ML IJ SOLN
50 mcg/mL | INTRAMUSCULAR | Status: DC | PRN
Start: 2014-05-31 — End: 2014-05-31
  Administered 2014-05-31 (×7): via INTRAVENOUS

## 2014-05-31 MED ORDER — LACTATED RINGERS IV
INTRAVENOUS | Status: DC
Start: 2014-05-31 — End: 2014-05-31
  Administered 2014-05-31: 17:00:00 via INTRAVENOUS

## 2014-05-31 MED ORDER — HYDROMORPHONE (PF) 2 MG/ML IJ SOLN
2 mg/mL | INTRAMUSCULAR | Status: DC | PRN
Start: 2014-05-31 — End: 2014-05-31

## 2014-05-31 MED ORDER — CEFAZOLIN 2 GRAM/50 ML NS IVPB
INTRAVENOUS | Status: AC
Start: 2014-05-31 — End: 2014-05-31
  Administered 2014-05-31: 15:00:00 via INTRAVENOUS

## 2014-05-31 MED ORDER — CEFAZOLIN 1 GRAM SOLUTION FOR INJECTION
1 gram | Freq: Three times a day (TID) | INTRAMUSCULAR | Status: AC
Start: 2014-05-31 — End: 2014-06-01
  Administered 2014-05-31 – 2014-06-01 (×2): via INTRAVENOUS

## 2014-05-31 MED ORDER — ONDANSETRON (PF) 4 MG/2 ML INJECTION
4 mg/2 mL | INTRAMUSCULAR | Status: DC | PRN
Start: 2014-05-31 — End: 2014-05-31
  Administered 2014-05-31: 16:00:00 via INTRAVENOUS

## 2014-05-31 MED ORDER — LIDOCAINE (PF) 20 MG/ML (2 %) IJ SOLN
20 mg/mL (2 %) | INTRAMUSCULAR | Status: DC | PRN
Start: 2014-05-31 — End: 2014-05-31
  Administered 2014-05-31: 15:00:00 via INTRAVENOUS

## 2014-05-31 MED ORDER — PROPOFOL 10 MG/ML IV EMUL
10 mg/mL | INTRAVENOUS | Status: AC
Start: 2014-05-31 — End: ?

## 2014-05-31 MED ORDER — LACTATED RINGERS IV
INTRAVENOUS | Status: DC
Start: 2014-05-31 — End: 2014-06-01
  Administered 2014-05-31: 22:00:00 via INTRAVENOUS

## 2014-05-31 MED FILL — PANTOPRAZOLE 40 MG TAB, DELAYED RELEASE: 40 mg | ORAL | Qty: 1

## 2014-05-31 MED FILL — LIDOCAINE (PF) 20 MG/ML (2 %) IV SYRINGE: 100 mg/5 mL (2 %) | INTRAVENOUS | Qty: 5

## 2014-05-31 MED FILL — PHENYLEPHRINE 10 MG/ML INJECTION: 10 mg/mL | INTRAMUSCULAR | Qty: 60

## 2014-05-31 MED FILL — LACTATED RINGERS IV: INTRAVENOUS | Qty: 1000

## 2014-05-31 MED FILL — ONDANSETRON (PF) 4 MG/2 ML INJECTION: 4 mg/2 mL | INTRAMUSCULAR | Qty: 4

## 2014-05-31 MED FILL — FENTANYL CITRATE (PF) 50 MCG/ML IJ SOLN: 50 mcg/mL | INTRAMUSCULAR | Qty: 2

## 2014-05-31 MED FILL — CEFAZOLIN 1 GRAM SOLUTION FOR INJECTION: 1 gram | INTRAMUSCULAR | Qty: 1000

## 2014-05-31 MED FILL — PROPOFOL 10 MG/ML IV EMUL: 10 mg/mL | INTRAVENOUS | Qty: 200

## 2014-05-31 MED FILL — ONDANSETRON (PF) 4 MG/2 ML INJECTION: 4 mg/2 mL | INTRAMUSCULAR | Qty: 2

## 2014-05-31 MED FILL — TYLENOL 325 MG TABLET: 325 mg | ORAL | Qty: 2

## 2014-05-31 MED FILL — LIDOCAINE (PF) 20 MG/ML (2 %) IJ SOLN: 20 mg/mL (2 %) | INTRAMUSCULAR | Qty: 100

## 2014-05-31 MED FILL — NAROPIN (PF) 5 MG/ML (0.5 %) INJECTION SOLUTION: 5 mg/mL (0. %) | INTRAMUSCULAR | Qty: 15

## 2014-05-31 MED FILL — PROMETHAZINE 25 MG/ML INJECTION: 25 mg/mL | INTRAMUSCULAR | Qty: 1

## 2014-05-31 MED FILL — CLONAZEPAM 1 MG TAB: 1 mg | ORAL | Qty: 1

## 2014-05-31 MED FILL — CEFAZOLIN 2 GRAM/50 ML NS IVPB: INTRAVENOUS | Qty: 50

## 2014-05-31 MED FILL — NAROPIN (PF) 5 MG/ML (0.5 %) INJECTION SOLUTION: 5 mg/mL (0. %) | INTRAMUSCULAR | Qty: 45

## 2014-05-31 MED FILL — PROPOFOL 10 MG/ML IV EMUL: 10 mg/mL | INTRAVENOUS | Qty: 20

## 2014-05-31 MED FILL — DIPHENHYDRAMINE HCL 50 MG/ML IJ SOLN: 50 mg/mL | INTRAMUSCULAR | Qty: 1

## 2014-05-31 MED FILL — DULOXETINE 30 MG CAP, DELAYED RELEASE: 30 mg | ORAL | Qty: 1

## 2014-05-31 MED FILL — OXYCODONE 5 MG TAB: 5 mg | ORAL | Qty: 2

## 2014-05-31 NOTE — Brief Op Note (Addendum)
BRIEF OPERATIVE NOTE    Date of Procedure: 05/31/2014   Preoperative Diagnosis: LEFT DISTAL TIBIA FRACTURE/LATERAL MALLEOLUS FRACTURE  Postoperative Diagnosis: LEFT DISTAL TIBIA FRACTURE/LATERAL MALLEOLUS FRACTURE    Procedure(s):  ORIF LEFT DISTAL TIBIA FRACTURE  ORIF  LEFT LATERAL MALLEOLUS FRACTURE  Surgeon(s) and Role:     * Overton MamMichael J Maleta Pacha, MD - Primary     *Kandy GarrisonStacie Cox, NP - First Assist  Anesthesia: General   Estimated Blood Loss: TOURNIQUET  Specimens: * No specimens in log *   Findings: NONE   Complications: NONE  Implants:   Implant Name Type Inv. Item Serial No. Manufacturer Lot No. LRB No. Used Action   SCR BNE LCK ST T8 2.7X12MM SS --  - [id number]  SCR BNE LCK ST T8 2.7X12MM SS --   [employer] [place] [id number] Left 1 Implanted   SCR BNE LCK ST T8 2.7X14MM SS --  - [id number]  SCR BNE LCK ST T8 2.7X14MM SS --   [employer] [place] [id number] Left 4 Implanted   PLATE FIB DSTL LAT 9H 151 LT -- LCP - [id number]  PLATE FIB DSTL LAT 9H 151 LT -- LCP  [employer] [place] [id number] Left 1 Implanted   SCR BNE LCK ST T25 3.5X14MM SS --  - [id number]  SCR BNE LCK ST T25 3.5X14MM SS --   [employer] [place] [id number] Left 2 Implanted   SCR BNE LCK ST T25 3.5X16MM SS --  - [id number]  SCR BNE LCK ST T25 3.5X16MM SS --   [employer] [place] [id number] Left 3 Implanted   SCR BNE CRTX ST HEX 3.5X14MM -- SS - [id number]  SCR BNE CRTX ST HEX 3.5X14MM -- SS  [employer] [place] [id number] Left 1 Implanted   SCR BNE CRTX ST HEX 3.5X18MM -- SS - [id number]  SCR BNE CRTX ST HEX 3.5X18MM -- SS  [employer] [place] [id number] Left 1 Implanted   SCR BNE LCK ST T25 3.5X28MM SS --  - [id number]  SCR BNE LCK ST T25 3.5X28MM SS --   [employer] [place] [id number] Left 3 Implanted   SCR BNE LCK ST T25 3.5X34MM SS --  - [id number]  SCR BNE LCK ST T25 3.5X34MM SS --   [employer] [place] [id number] Left 2 Implanted   SCR BNE LCK ST T25 3.5X38MM SS --  - [id number]  SCR BNE LCK ST T25 3.5X38MM SS --   [employer] [place] [id number] Left 1 Implanted   SCR BNE LCK ST T25 3.5X40MM SS --  - [id number]  SCR BNE LCK ST T25  3.5X40MM SS --   [employer] [place] [id number] Left 1 Implanted   SCR BNE LCK ST T25 3.5X48MM SS --  - [id number]  SCR BNE LCK ST T25 3.5X48MM SS --   [employer] [place] [id number] Left 1 Implanted   SCR BNE LCK ST T25 3.5X46MM SS --  - [id number]  SCR BNE LCK ST T25 3.5X46MM SS --   [employer] [place] [id number] Left 1 Implanted   SCR BNE LCK ST T25 3.5X42MM SS --  - [id number]  SCR BNE LCK ST T25 3.5X42MM SS --   [employer] [place] [id number] Left 1 Implanted   SCR BNE CONC LCK ST FT 3.5X40M -- SS - [id number]  SCR BNE CONC LCK ST FT 3.5X40M -- SS  [employer] [place] [id number] Left 1 Implanted   PLATE TIB DSTL LB 14H LT 239MM --  - [id number]   PLATE TIB DSTL LB 14H LT 239MM --    [employer] [place] [id number] Left 1 Implanted

## 2014-05-31 NOTE — Anesthesia Procedure Notes (Signed)
Peripheral Block    Start time: 05/31/2014 9:30 AM  End time: 05/31/2014 9:36 AM  Block Type: left popliteal  Reason for block: at surgeon's request and post-op pain management  Staffing  Anesthesiologist: Gillermina HuHERTY, Askari Kinley R  Performed by: anesthesiologist   Prep  Risks and benefits discussed with the patient and plans are to proceed  Site marked, Timeout performed, 09:29  Monitoring: standard ASA monitoring, continuous pulse ox, heart rate, responsive to questions, oxygen and frequent vital sign checks  Injection Technique: single-shot  Procedures: ultrasound guided and nerve stimulator  Prep Solution(s): chlorhexidine  Needle  Needle: 20G Stimuplex (Echogenic)  Needle localization: nerve stimulator and ultrasound guidance  Minimal motor response <0.5 mA and >0.3 mA  Medication Injected: 40mL 0.375% ropivacaine (3 cc 1% lidocaine local anesthetic at insertion site) with epi 1:200K    Assessment  Injection Assessment: incremental injection every 5 mL, local visualized surrounding nerve on ultrasound, negative aspiration for blood, no paresthesia, no intravascular symptoms and ultrasound image on chart  Patient tolerated without any apparent complications    Peripheral Block    Start time: 05/31/2014 9:38 AM  End time: 05/31/2014 9:41 AM  Block Type: left saphenous  Reason for block: at surgeon's request and post-op pain management  Staffing  Anesthesiologist: Gillermina HuHERTY, Fareed Fung R  Performed by: anesthesiologist   Prep  Risks and benefits discussed with the patient and plans are to proceed  Site marked, Timeout performed, 09:37  Monitoring: standard ASA monitoring, continuous pulse ox, frequent vital sign checks, heart rate, responsive to questions and oxygen  Injection Technique: single-shot  Procedures: ultrasound guided  Patient was placed in supine position  Prep Solution(s): chlorhexidine  Region: mid thigh  Needle  Needle: 20G Stimuplex  Needle localization: ultrasound guidance     Medication Injected: 20mL 0.375% ropivacaine (3 cc 1% lidocaine at needle insertion site) with epi 1:200K    Assessment  Injection Assessment: incremental injection every 5 mL, local visualized surrounding nerve on ultrasound, negative aspiration for blood, no paresthesia, no intravascular symptoms and ultrasound image on chart  Patient tolerated without any apparent complications

## 2014-05-31 NOTE — Progress Notes (Signed)
Pt admitted to 702. Admission database completed. Pt given admission packet, oriented to room. Pt verbalizes understanding. Call light within reach. Updated primary RN.

## 2014-05-31 NOTE — Op Note (Signed)
[facility]                            [address]                           [place] [id number]                                [phone]                                OPERATIVE REPORT    NAME:  Sherwood GamblerMisanik, Krista D                         MR:  [id number]  LOC:  PACUPACUPL            SEX:  F               ACCT:  [id number]  DOB:  [birth year]            AGE:  49              PT:  V  ADMIT:  05/31/2014          DSCH:                 MSV:        DATE OF SURGERY: 05/31/2014    PREOPERATIVE DIAGNOSES  1. Left distal tibial shaft fracture.  2. Left lateral malleolus fracture.    POSTOPERATIVE DIAGNOSES  1. Left distal tibial shaft fracture.  2. Left lateral malleolus fracture.    NAME OF PROCEDURE  1. Open reduction and internal fixation of left distal tibial shaft  fracture.  2. Open reduction internal fixation of left lateral malleolus fracture.    SURGEON: Nolon BussingMichael J. Maud Deed'Boyle, MD.    FIRST ASSIST: Kandy GarrisonStacie Cox, NP    ANESTHESIA: General with a block for postoperative pain relief.    PROCEDURE: The patient was brought to the operative suite, placed in  supine position. After adequate anesthesia was achieved in form of  general with a block, a tourniquet was applied to the left thigh with  adequate padding and Sof-Rol. It was preset to a level of 300 mmHg. The  left lower extremity was then prepped and draped in the usual sterile  fashion. It was elevated and exsanguinated with the Esmarch. Tourniquet  was inflated. Incision was made over the lateral aspect of the distal  fibula. Hemostasis achieved with Bovie cautery. Fascia incised along the  line of the skin incision. The peroneal tendons and superficial peroneal  nerves were identified and protected. The fracture was shortened and  mildly comminuted. It was provisionally aligned with standard reduction  clamps, but the decision was made to use a push-pull screw. So, a Synthes   distal fibular locking plate was chosen. It was fashioned to sit along  the lateral aspect of the distal fibula. It was then secured distally  with two 2.4 mm locking screws. A push-pull screw was then placed  proximal and the laminar spreader was used to regain fibula length, both  under direct visualization as the fracture reduced, as well as on  fluoroscopic imaging. The plate was then taken down to the bone with 3.5  cortical  screw proximally. Additional locking screws were placed  proximally and distally and then the original cortical screw was removed.  AP, lateral, and mortise fluoroscopic images confirmed that the fibular  fracture was now reduced anatomically, except for small butterfly  fragment that was left free. Its alignment was good. Its length was good.  This wound was closed in layers. We turned our attention to the medial  side. A hockey-stick incision was made over the medial malleolus.  The greater saphenous vein was identified and protected. The periosteal  stripper was used through this portal, to strip the periosteum along the  medial aspect of the tibia. The appropriate length tibial Synthes distal  tibial locking plate was then chosen. It was slid in a percutaneous  fashion through this portal, across the fracture site and up along the  medial shaft. It was secured distally, initially with a conical screw.  This pulled the plate to the bone. A second incision was made at the  proximal end of the plate and the push-pull screw was placed here.  The laminar spreader was used to regain length and alignment, and then  locking screws were placed proximally and distally. A second stab wound  was made for additional locking screws above the plate so we had 4  locking screws above the plate and had 6 distal. The original conical  screw was replaced with a locking screw. AP, lateral, and mortise  fluoroscopic images confirmed the fracture was reduced anatomically and   the hardware was in good position. No hardware was in the joint. The  wounds were irrigated with normal saline and closed in layers. Sterile  compressive dressing was applied. Tourniquet was deflated. Vabra splint  was applied. The patient was then reversed from anesthesia and  transferred to the recovery room, alert and oriented under the care of  Anesthesia.    The use of a surgical assistant was medically necessary for the completion of this procedure. The assistant provided retraction, protection and optimal visualization of critical anatomy plus helped with positioning and manipulation throughout the entire procedure. The surgical assistant assisted with the use of the essential operative instruments and tools. It would not have been possible to safely and effectively complete the surgical procedure in the reduced timeframe thus reducing the chance of complication had the assistant not been utilized.    ESTIMATED BLOOD LOSS: Minimal.    FLUIDS: See anesthesia record.    CLOSURE: Primary.    COMPLICATIONS: None.                Charlotta Newton, MD        A                This is an unverified document unless signed by physician.    TID:  [id number]                                      DT:  05/31/2014 03:14 P  JOB:  [id number]           DOC#:  [id number]           DD:  05/31/2014    cc:   Charlotta Newton, MD

## 2014-05-31 NOTE — Other (Signed)
Report to John Rice RN for cont'd care.

## 2014-05-31 NOTE — Anesthesia Post-Procedure Evaluation (Signed)
Post-Anesthesia Evaluation and Assessment    Patient: Krista AgeeShannon Lopez MRN: [id number]  SSN: [id number]    Date of Birth: 1965-12-11  Age: 49 y.o.  Sex: female       Cardiovascular Function/Vital Signs  Visit Vitals   Item Reading   ??? BP 131/58 mmHg   ??? Pulse 85   ??? Temp 37 ??C (98.6 ??F)   ??? Resp 16   ??? Ht 5' 4.5" (1.638 m)   ??? Wt 68.04 kg (150 lb)   ??? BMI 25.36 kg/m2   ??? SpO2 91%     Bedside sat 95%.    Patient is status post general, regional anesthesia for Procedure(s):  ORIF LEFT DISTAL TIBIA FRACTURE/LATERAL MALLEOLUS FRACTURE.    Nausea/Vomiting: None    Postoperative hydration reviewed and adequate.    Pain:  Pain Scale 1: Numeric (0 - 10) (05/31/14 1241)  Pain Intensity 1: 0 (05/31/14 1241)   Managed    Neurological Status:   Neuro (WDL): Exceptions to WDL (05/31/14 1241)  Neuro  LLE Motor Response: Numbness (s/p anesthesia block) (05/31/14 1241)     Mental Status and Level of Consciousness: Alert and oriented     Pulmonary Status:   RA  Adequate oxygenation and airway patent    Complications related to anesthesia: None    Post-anesthesia assessment completed. No concerns    Signed By: Nehemiah SettleJONATHAN R Lynford Espinoza, MD     May 31, 2014

## 2014-05-31 NOTE — Progress Notes (Signed)
RN took report and assessed patient, I concur with his assessment, will take over care from this point on.

## 2014-05-31 NOTE — Op Note (Signed)
[facility]                            [address]                           [place], [place]. [address]                                [phone]                                OPERATIVE REPORT    NAME:  Krista Lopez, Krista Lopez                         MR:  [id number]  LOC:  PACUPACUPL            SEX:  F               ACCT:  [id number]  DOB:  24-Aug-1965            AGE:  49              PT:  V  ADMIT:  05/31/2014          DSCH:                 MSV:        DATE OF SURGERY: 05/31/2014    PREOPERATIVE DIAGNOSES  1. Left distal tibial shaft fracture.  2. Left lateral malleolus fracture.    POSTOPERATIVE DIAGNOSES  1. Left distal tibial shaft fracture.  2. Left lateral malleolus fracture.    NAME OF PROCEDURE  1. Open reduction and internal fixation of left distal tibial shaft  fracture.  2. Open reduction internal fixation of left lateral malleolus fracture.    SURGEON: Nolon Bussing. Maud Deed, MD.    FIRST ASSIST: Kandy Garrison, NP    ANESTHESIA: General with a block for postoperative pain relief.    PROCEDURE: The patient was brought to the operative suite, placed in  supine position. After adequate anesthesia was achieved in form of  general with a block, a tourniquet was applied to the left thigh with  adequate padding and Sof-Rol. It was preset to a level of 300 mmHg. The  left lower extremity was then prepped and draped in the usual sterile  fashion. It was elevated and exsanguinated with the Esmarch. Tourniquet  was inflated. Incision was made over the lateral aspect of the distal  fibula. Hemostasis achieved with Bovie cautery. Fascia incised along the  line of the skin incision. The peroneal tendons and superficial peroneal  nerves were identified and protected. The fracture was shortened and  mildly comminuted. It was provisionally aligned with standard reduction  clamps, but the decision was made to use a push-pull screw. So, a Synthes  distal fibular locking plate was chosen.  It was fashioned to sit along  the lateral aspect of the distal fibula. It was then secured distally  with two 2.4 mm locking screws. A push-pull screw was then placed  proximal and the laminar spreader was used to regain fibula length, both  under direct visualization as the fracture reduced, as well as on  fluoroscopic imaging. The plate was then taken down to the bone with 3.5  cortical  screw proximally. Additional locking screws were placed  proximally and distally and then the original cortical screw was removed.  AP, lateral, and mortise fluoroscopic images confirmed that the fibular  fracture was now reduced anatomically, except for small butterfly  fragment that was left free. Its alignment was good. Its length was good.  This wound was closed in layers. We turned our attention to the medial  side. A hockey-stick incision was made over the medial malleolus.  The greater saphenous vein was identified and protected. The periosteal  stripper was used through this portal, to strip the periosteum along the  medial aspect of the tibia. The appropriate length tibial Synthes distal  tibial locking plate was then chosen. It was slid in a percutaneous  fashion through this portal, across the fracture site and up along the  medial shaft. It was secured distally, initially with a conical screw.  This pulled the plate to the bone. A second incision was made at the  proximal end of the plate and the push-pull screw was placed here.  The laminar spreader was used to regain length and alignment, and then  locking screws were placed proximally and distally. A second stab wound  was made for additional locking screws above the plate so we had 4  locking screws above the plate and had 6 distal. The original conical  screw was replaced with a locking screw. AP, lateral, and mortise  fluoroscopic images confirmed the fracture was reduced anatomically and  the hardware was in good position. No hardware was in the joint.  The  wounds were irrigated with normal saline and closed in layers. Sterile  compressive dressing was applied. Tourniquet was deflated. Vabra splint  was applied. The patient was then reversed from anesthesia and  transferred to the recovery room, alert and oriented under the care of  Anesthesia.    The use of a surgical assistant was medically necessary for the completion of this procedure. The assistant provided retraction, protection and optimal visualization of critical anatomy plus helped with positioning and manipulation throughout the entire procedure. The surgical assistant assisted with the use of the essential operative instruments and tools. It would not have been possible to safely and effectively complete the surgical procedure in the reduced timeframe thus reducing the chance of complication had the assistant not been utilized.    ESTIMATED BLOOD LOSS: Minimal.    FLUIDS: See anesthesia record.    CLOSURE: Primary.    COMPLICATIONS: None.                Charlotta NewtonMichael J O'Boyle, MD        A                This is an unverified document unless signed by physician.    TID:  [id number]                                      DT:  05/31/2014 03:14 P  JOB:  [id number]           DOC#:  [id number]           DD:  05/31/2014    cc:   Charlotta NewtonMichael J O'Boyle, MD

## 2014-05-31 NOTE — Anesthesia Pre-Procedure Evaluation (Addendum)
Anesthetic History   No history of anesthetic complications            Review of Systems / Medical History  Patient summary reviewed and pertinent labs reviewed    Pulmonary  Within defined limits              Comments: PE- multiple   Neuro/Psych         Headaches and psychiatric history    Comments: RLS Cardiovascular      Valvular problems/murmurs (h/o murmur)            Exercise tolerance: >4 METS  Comments: Factor V Leiden mutation; multiple episodes DVT / pulm emboli.  On warfarin.  Denies CP, SOB or changes in functional status outside of limitations due to pain from stone   GI/Hepatic/Renal         Renal disease: stones  PUD    Comments: GI bleeds     Endo/Other        Anemia     Other Findings   Comments: Opioid dependency         Physical Exam    Airway  Mallampati: II  TM Distance: 4 - 6 cm  Neck ROM: normal range of motion   Mouth opening: Normal     Cardiovascular    Rhythm: regular  Rate: normal         Dental    Dentition: Upper partial plate and Poor dentition     Pulmonary  Breath sounds clear to auscultation               Abdominal  GI exam deferred       Other Findings            Anesthetic Plan    ASA: 3  Anesthesia type: general and regional - popliteal fossa block and saphenous block      Post-op pain plan if not by surgeon: peripheral nerve block single    Induction: Intravenous  Anesthetic plan and risks discussed with: Patient      Pt requests GA in addition to PNBs.

## 2014-05-31 NOTE — H&P (Signed)
Outpatient Surgery History and Physical      Krista Lopez was seen and examined.    Chief Complaint:   LEFT TIBIA/FIBULA FRACTURE.     Physical Exam:   BP 124/69 mmHg   Pulse 73   Temp(Src) 97.8 ??F (36.6 ??C)   Resp 18   Ht 5' 4.5" (1.638 m)   Wt 68.04 kg (150 lb)   BMI 25.36 kg/m2   SpO2 99%   LMP 07/28/2004    Heart:   Regular rhythm      Lungs:  Are clear      History:  Past Medical History   Diagnosis Date   ??? Factor V Leiden mutation (HCC)    ??? Infectious disease 02/2009      Hx MRSA / E.Coli bacteremia, MRSA wound infection 2/2 port   ??? Factor II deficiency (HCC)    ??? Calculus of kidney    ??? Depression    ??? Iron deficiency    ??? Migraines    ??? 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 Bipolar disorder (HCC)    ??? Anxiety    ??? Restless leg syndrome    ??? Hematuria, microscopic       Past Surgical History   Procedure Laterality Date   ??? Cystoscopy  ???     x 13   ??? Lithotripsy  2006   ??? Hx vascular access  July, 2010     [place]   ??? Hx vascular access Right 2013     pt [facility]   ??? Hx appendectomy  ???   ??? Hx tonsillectomy     ??? Hx cesarean section     ??? Hx tubal ligation     ??? Hx ovarian cyst removal       on R     Family History   Problem Relation Age of Onset   ??? Bleeding Prob Father      Factor V   ??? Cancer Maternal Grandmother      metastatic breast CA.   ??? Ovarian Cancer Mother [id number]     passed away due to ca   ??? Bipolar Disorder Mother    ??? Kidney Disease Brother      kidney stones      Social History     Occupational History   ??? Not on file.     Social History Main Topics   ??? Smoking status: Never Smoker    ??? Smokeless tobacco: Never Used   ??? Alcohol Use: Yes      Comment: rarely - only drinks 2 times per year   ??? Drug Use: No      Comment: narcotic seeking per hospital hx.   ??? Sexual Activity:     Partners: Male     Pharmacist, hospitalBirth Control/ Protection: Surgical        Allergies: Reviewed per EMR  Allergies   Allergen Reactions   ??? Ativan [Lorazepam] Itching   ??? Codeine Rash   ??? Demerol [Meperidine] Itching     *     ??? Hydrocodone-Ibuprofen Unknown (comments)   ??? Iodinated Contrast Media - Iv Dye Rash     States also has swelling, and allergic to shellfish   ??? Ketorolac Tromethamine Rash   ??? Motrin [Ibuprofen] Other (comments)     Causes stomach upset after 3 doses.   ???  Pcn [Penicillins] Hives   ??? Pneumococcal Vaccine Swelling   ??? Pneumovax 23 [Pneumococcal 23-Val Ps Vaccine] Other (comments)     Local reaction   ??? Shellfish Containing Products Rash       Medications:    Prior to Admission medications    Medication Sig Start Date End Date Taking? Authorizing Provider   cholecalciferol (VITAMIN D3) 1,000 unit cap Take  by mouth daily.   Yes Historical Provider   ondansetron (ZOFRAN ODT) 8 mg disintegrating tablet Take 1 Tab by mouth every twelve (12) hours as needed for Nausea. 05/14/14  Yes Lance Sell, MD   pantoprazole (PROTONIX) 40 mg tablet Take 40 mg by mouth two (2) times a day.   Yes Phys Other, MD   zolpidem (AMBIEN) 10 mg tablet Take  by mouth nightly as needed for Sleep.   Yes Phys Other, MD   SUMAtriptan (IMITREX) 100 mg tablet Take 1 Tab by mouth once as needed for Migraine. 08/10/13  Yes Ricarda Frame III, MD   DULoxetine (CYMBALTA) 60 mg capsule Take 3 caps every day.  Indications: ANXIETY WITH DEPRESSION  Patient taking differently: 30 mg two (2) times a day. Pt states she take  BID  Indications: ANXIETY WITH DEPRESSION 07/18/13  Yes Lopa S Bhansaly, DO   clonazePAM (KLONOPIN) 1 mg tablet Take 1 mg by mouth three (3) times daily.   Yes Historical Provider   butalbital-acetaminophen-caffeine (FIORICET, ESGIC) 50-325-40 mg per tablet Take 1 Tab by mouth every four (4) hours as needed for Pain or Headache. Max Daily Amount: 6 Tabs. 08/31/13   Samella Parr, MD        The surgery is planned for ORIF LEFT TIBIA/FIBULA.           The patient is here today for outpatient surgery. I have examined the patient, no changes are noted in the patient's medical status. Necessity for the procedure/care is still present and the history and physical above is current.  See the office notes for the full long term history of the problem.  Please see the recent office notes for the musculoskeletal examination.    Signed By: Ashley Mariner, NP     May 31, 2014 8:43 AM

## 2014-05-31 NOTE — Progress Notes (Signed)
TRANSFER - IN REPORT:    Verbal report received from Fredda HammedJohn Rice RN(name) [facility]  being received from [facility](unit) for routine progression of care      Report consisted of patient???s Situation, Background, Assessment and   Recommendations(SBAR).     Information from the following report(s) SBAR was reviewed with the receiving nurse.    Opportunity for questions and clarification was provided.      Assessment completed upon patient???s arrival to unit and care assumed.

## 2014-05-31 NOTE — Anesthesia Procedure Notes (Signed)
Peripheral Block    Start time: 05/31/2014 9:30 AM  End time: 05/31/2014 9:36 AM  Block Type: left popliteal  Reason for block: at surgeon's request and post-op pain management  Staffing  Anesthesiologist: Gillermina HuHERTY, Kingsley Farace R  Performed by: anesthesiologist   Prep  Risks and benefits discussed with the patient and plans are to proceed  Site marked, Timeout performed, 09:29  Monitoring: standard ASA monitoring, continuous pulse ox, heart rate, responsive to questions, oxygen and frequent vital sign checks  Injection Technique: single-shot  Procedures: ultrasound guided and nerve stimulator  Prep Solution(s): chlorhexidine  Needle  Needle: 20G Stimuplex (Echogenic)  Needle localization: nerve stimulator and ultrasound guidance  Minimal motor response <0.5 mA and >0.3 mA  Medication Injected: 40mL 0.375% ropivacaine (3 cc 1% lidocaine local anesthetic at insertion site) with epi 1:200K    Assessment  Injection Assessment: incremental injection every 5 mL, local visualized surrounding nerve on ultrasound, negative aspiration for blood, no paresthesia, no intravascular symptoms and ultrasound image on chart  Patient tolerated without any apparent complications    Peripheral Block    Start time: 05/31/2014 9:38 AM  End time: 05/31/2014 9:41 AM  Block Type: left saphenous  Reason for block: at surgeon's request and post-op pain management  Staffing  Anesthesiologist: Gillermina HuHERTY, Sole Lengacher R  Performed by: anesthesiologist   Prep  Risks and benefits discussed with the patient and plans are to proceed  Site marked, Timeout performed, 09:37  Monitoring: standard ASA monitoring, continuous pulse ox, frequent vital sign checks, heart rate, responsive to questions and oxygen  Injection Technique: single-shot  Procedures: ultrasound guided  Patient was placed in supine position  Prep Solution(s): chlorhexidine  Region: mid thigh  Needle  Needle: 20G Stimuplex  Needle localization: ultrasound guidance    Medication Injected: 20mL 0.375%  ropivacaine (3 cc 1% lidocaine at needle insertion site) with epi 1:200K    Assessment  Injection Assessment: incremental injection every 5 mL, local visualized surrounding nerve on ultrasound, negative aspiration for blood, no paresthesia, no intravascular symptoms and ultrasound image on chart  Patient tolerated without any apparent complications

## 2014-05-31 NOTE — Progress Notes (Signed)
Dual skin assessment completed by this RN and Antonietta BarcelonaMegan Kelly, RN. Patient has dressing to LLE, CDI.  Port to Right upper chest.

## 2014-06-01 MED ORDER — HEPARIN, PORCINE (PF) 100 UNIT/ML IV SYRINGE
100 unit/mL | INTRAVENOUS | Status: DC | PRN
Start: 2014-06-01 — End: 2014-06-01
  Administered 2014-06-01: 14:00:00

## 2014-06-01 MED FILL — TYLENOL 325 MG TABLET: 325 mg | ORAL | Qty: 2

## 2014-06-01 MED FILL — CEFAZOLIN 1 GRAM SOLUTION FOR INJECTION: 1 gram | INTRAMUSCULAR | Qty: 1000

## 2014-06-01 MED FILL — OXYCODONE 5 MG TAB: 5 mg | ORAL | Qty: 2

## 2014-06-01 MED FILL — PANTOPRAZOLE 40 MG TAB, DELAYED RELEASE: 40 mg | ORAL | Qty: 1

## 2014-06-01 MED FILL — CLONAZEPAM 1 MG TAB: 1 mg | ORAL | Qty: 1

## 2014-06-01 MED FILL — MONOJECT PREFILL ADVANCED (PF) 100 UNIT/ML INTRAVENOUS SYRINGE: 100 unit/mL | INTRAVENOUS | Qty: 3

## 2014-06-01 MED FILL — ONDANSETRON (PF) 4 MG/2 ML INJECTION: 4 mg/2 mL | INTRAMUSCULAR | Qty: 2

## 2014-06-01 MED FILL — HYDROMORPHONE (PF) 1 MG/ML IJ SOLN: 1 mg/mL | INTRAMUSCULAR | Qty: 2

## 2014-06-01 MED FILL — DULOXETINE 30 MG CAP, DELAYED RELEASE: 30 mg | ORAL | Qty: 1

## 2014-06-01 MED FILL — LOVENOX 30 MG/0.3 ML SUB-Q SYRINGE: 30 mg/0.3 mL | SUBCUTANEOUS | Qty: 0.3

## 2014-06-01 MED FILL — HYDROMORPHONE (PF) 1 MG/ML IJ SOLN: 1 mg/mL | INTRAMUSCULAR | Qty: 1

## 2014-06-01 MED FILL — ZOLPIDEM 5 MG TAB: 5 mg | ORAL | Qty: 1

## 2014-06-01 NOTE — Progress Notes (Signed)
Care Management Interventions  [facility] at [facility]charge[facility]  Transition of Care Consult (CM Consult): Home Health  [facility]: No  Reason [facility] Chosen: Physician referred to specific agency  Discharge Durable Medical Equipment: Yes [facility](B[facility] from [facility])  Physical Therapy Consult: No  Occupational Therapy Consult: No  Speech Therapy Consult: No  Current Support Network: Own Home (has a roommate; no other reliable social support per pt; no friends have cars)  Confirm Follow Up Transport: Cab  Plan discussed with Pt/Family/Caregiver: Yes  Discharge Location  Discharge Placement: Home with home health ([facility])    Pt is a 49 yo female admitted for surgical repair of a left tibia fracture.  Pt is medically cleared for discharge to home today.  She will have home health nursing and therapy services through [facility] as arranged by Dr. Maryruth Eve'Boyle's office per his protocol.  Pt needing a BSC for home use.  [facility] and [facility] to [facility] for delivery to pt's home tomorrow.  Pt has no way to go pick it up from their office and it can not be delivered to the hospital prior to discharge.  Pt transported home via Medicaid van.  No other discharge needs or concerns identified or reported.

## 2014-06-01 NOTE — Progress Notes (Signed)
Pt. was placed on Continuous Pulse Oximetry#252 per Dr. Huel Coventryboyle's order. Monitor room was notified.

## 2014-06-01 NOTE — Telephone Encounter (Signed)
Pt calling back and she is still crying and upset.  They kept her overnight after her foot surgery so she was not able to call yesterday.  They are discharging her now.  She states that DR O'boyle told her that she had to be off of it for 12 weeks.  I asked if we worked it out with O'boyle for her to come in could she come. She states she would need 3 days [facility] [employer] [facility].  She has a cast and will be using crutches.  She will need a morning appt .

## 2014-06-01 NOTE — Progress Notes (Signed)
Discharge instructions and prescriptions given and explained to pt. Pt verbalized understanding. Medication side effects sheet reviewed with pt. Pt discharged home.

## 2014-06-01 NOTE — Telephone Encounter (Signed)
Per Marylene LandAngela at Dr Maud Deed'Boyle she can come for her stent removal.  Patient is aware  rli

## 2014-06-03 NOTE — Consult Note (Signed)
Brief Consult Note: Diagnosis: Bipolar disorder nos.   Patient was seen by consultant.   Consult note dictated.   Comments: Psychiatry: Patient seen. Chart reviewed. Patient currently awake, alert and calm. Denies feeling depressed and denies any psychotic symptoms. Denies any suicidal ideation. Does not appear delirious. Patient reports she is chronically on cymbalta 90mg  a day and seroquel 300mg  qhs. She tells me her clonazepam was recently stopped because her new primary care M.D. "can't" prescribe it. She denies any history of SA. See full note but I would suggest at this point continuing the cymbalta and seroquel as you are but not restarting the klonipin. If possible [facility] prior to [facility]. No other change to treatment ordered. Will follow as needed.  Electronic Signatures: Audery Amellapacs, Vidya Bamford T (MD)  (Signed 11-Feb-15 18:50)  Authored: Brief Consult Note   Last Updated: 11-Feb-15 18:50 by Audery Amellapacs, Kamari Bilek T (MD)

## 2014-06-03 NOTE — H&P (Signed)
PATIENT NAME:  MAYO, OWCZARZAK MR#:  [id number] DATE OF BIRTH:  1965/04/01  DATE OF ADMISSION:  03/20/2013  PRIMARY CARE PHYSICIAN:  In [place].   CHIEF COMPLAINT: Chest pain.   HISTORY OF PRESENT ILLNESS: A 49 year old female with history of factor V Leiden deficiency, factor II mutation analysis and PE. She recently traveled on a bus, started developing pain in the back on and off, worsening pain. She then started breathing heavy. Unfortunately, she grabbed the wrong bottle prior to her trip and did not bring her Coumadin. She has been without her Coumadin for 1 week. She recently got engaged here in [place] and was planning to go back to [place] to start back on her medications, developed some chest pain, knifelike, 8 out of 10 in intensity, severe, continuous. Hospitalist services were contacted for further evaluation when the patient had a borderline troponin of 0.1 and an elevated d-dimer   PAST MEDICAL HISTORY: Kidney stones, migraine, depression, factor Leiden V deficiency, factor II mutation analysis and history of pulmonary embolism.   PAST SURGICAL HISTORY: Appendix, tubal ligation, cyst on the ovary, 4 ports placed, tonsils and adenoids.   ALLERGIES: CODEINE, PENICILLIN, TORADOL, SHELLFISH, IV CONTRAST AND PNEUMONIA VACCINATION.   MEDICATIONS: Coumadin 5 mg at bedtime, Seroquel 300 mg at bedtime, Cymbalta 90 mg in the a.m., Klonopin 1 mg t.i.d., Benadryl as needed for itching.   SOCIAL HISTORY: No smoking. Rare alcohol. No drug use. 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   FAMILY HISTORY: Father was a killed in a car accident and had factor V Leiden. Mother killed in a car wreck. Brother died of an accident, he fell through a roof as a IT sales professional.   REVIEW OF SYSTEMS: CONSTITUTIONAL: Positive for chills. Positive for sweats. No weight loss. No weight gain. Positive for fatigue.  EYES: Decreased vision.  EARS, NOSE, MOUTH AND THROAT: No hearing  loss. No sore throat. No difficulty swallowing.  CARDIOVASCULAR: Positive for chest pain.  RESPIRATORY: Positive for shortness of breath. No cough. No sputum. No hemoptysis.  GASTROINTESTINAL: Positive for nausea. Positive for vomiting. No abdominal pain. No diarrhea. No constipation. No bright red blood per rectum. No melena.  GENITOURINARY: No burning on urination. No hematuria.  MUSCULOSKELETAL: No joint pain.  INTEGUMENTARY: Positive for itching.  NEUROLOGIC: No fainting or blackouts.  PSYCHIATRIC: Positive for anxiety/depression.  ENDOCRINE: No thyroid problems.  HEMATOLOGIC AND LYMPHATIC: No anemia.    PHYSICAL EXAMINATION: VITAL SIGNS: On presentation to the ER included a temperature 98.2, pulse 96, respirations 20, blood pressure 162/90, pulse ox 97% on room air.  GENERAL: No respiratory distress.  EYES: Conjunctivae and lids normal. Pupils equal, round and reactive to light. Extraocular muscles intact. No nystagmus.  EARS, NOSE, MOUTH AND THROAT: Tympanic membranes: No erythema. Nasal mucosa: No erythema. Throat: No erythema. No exudate seen. Lips and gums: No lesions.  NECK: No JVD. No bruits. No lymphadenopathy. No thyromegaly. No thyroid nodules palpated.  RESPIRATORY:  Lungs clear to auscultation. No use of accessory muscles to breathe. No rhonchi, rales or wheeze heard.  CARDIOVASCULAR: S1, S2 normal. No gallops, rubs or murmurs heard. Carotid upstroke 2+ bilaterally. No bruits. Dorsalis pedis pulses 2+ bilaterally. All pulses equal throughout upper and lower extremities. No edema of the lower extremity.  ABDOMEN: Soft, nontender. No organomegaly/splenomegaly. Normoactive bowel sounds. No masses felt.  LYMPHATIC: No lymph nodes in the neck.  MUSCULOSKELETAL: No clubbing, edema or cyanosis.  SKIN: No ulcers or lesions.  NEUROLOGIC:  Cranial nerves II through XII grossly intact. Deep tendon reflexes 1+ bilateral lower extremities.  PSYCHIATRIC: The patient is oriented to person,  place and time.   LABORATORY, DIAGNOSTIC AND RADIOLOGICAL DATA:  Chest x-ray no acute abnormality. Pregnancy test negative. D-dimer 1.94. Troponin borderline at 0.1. INR 1.2. White blood cell count 5.4, H and H 10.3 and 30.9, platelet count 199. Glucose 90, BUN 17, creatinine 0.88, sodium 141, potassium 3.9, chloride 108, CO2 26, calcium 9.0. Liver function tests: Alkaline phosphatase slightly elevated at 78. EKG: Normal sinus rhythm, 80 beats per minute, inferior leads flipped Ts, anterior leads flipped Ts.   ASSESSMENT AND PLAN: 1.  A suspected pulmonary embolism with history of factor  V Leiden and factor II mutation analysis. Did not take her Coumadin for the past week and her INR is subtherapeutic. The patient is allergic to IV contrast. A V/Q scan ordered by ER physician. Will also get an echocardiogram. IV heparin drip started and will restart Coumadin this evening.  2.  Abnormal EKG and elevated troponin. We will monitor on telemetry. Get serial cardiac enzymes. I believe this is likely related to pulmonary embolism. If the V/Q scan is negative, I will start aspirin and get a cardiology consultation.  3.  Anxiety, depression. Continue Seroquel, Cymbalta and Klonopin.   TIME SPENT ON ADMISSION: 55 minutes.    ____________________________ Herschell Dimesichard J. Renae GlossWieting, MD rjw:cs D: 03/20/2013 15:49:36 ET T: 03/20/2013 18:41:09 ET JOB#: [id number]  cc: Herschell Dimesichard J. Renae GlossWieting, MD, <Dictator> Salley ScarletICHARD J Camrin Gearheart MD ELECTRONICALLY SIGNED 04/02/2013 12:21

## 2014-06-03 NOTE — Consult Note (Signed)
PATIENT NAME:  Selena Conner, [name] MR#:  [id number] DATE OF BIRTH:  1965-12-27  DATE OF CONSULTATION:  03/20/2013  CONSULTING PHYSICIAN:  Laurier NancyShaukat A. Montana Fassnacht, MD  INDICATION FOR CONSULT: Chest pain.   HISTORY OF PRESENT ILLNESS: This is a 49 year old white female with a past medical history of factor V Leiden deficiency, factor II mutation analysis, and history of pulmonary embolism, who came into the hospital with chest pain. The chest pain was described as sharp, associated with shortness of breath and diaphoresis. Intensity was 8/10. Initial troponin was 0.1 and elevated D-dimer. She underwent a V/Q scan because she is allergic to dye, and V/Q scan came back negative. Thus, I was asked to evaluate the patient.   PAST MEDICAL HISTORY: History of kidney stone, depression, factor Leiden deficiency, factor II mutation, history of pulmonary embolism and DVT.    ALLERGIES: CODEINE, PENICILLIN, TORADOL, SHELLFISH, IV CONTRAST, PNEUMONIA VACCINATION.   MEDICATIONS: Coumadin 5 mg, Seroquel, Cymbalta, Klonopin, Benadryl.   SOCIAL HISTORY: She denies EtOH abuse or smoking.   FAMILY HISTORY: Positive for coronary artery disease and history of factor Leiden deficiency.   PHYSICAL EXAMINATION: GENERAL: She is alert, oriented x 3, in no acute distress.  VITAL SIGNS: Stable.  NECK: Exam revealed no JVD.  LUNGS: Clear.  HEART: Regular rate and rhythm. Normal S1, S2. No audible murmur.  ABDOMEN: Soft, nontender, positive bowel sounds.  EXTREMITIES: No pedal edema.   DIAGNOSTIC DATA: EKG shows normal sinus rhythm, 80 beats per minute, T-wave inversion V2 to V4. T-wave inversion also in lead II. Troponin was 0.1. V/Q scan apparently is negative.   ASSESSMENT AND PLAN: The patient has acute coronary syndrome, elevated troponin, ischemic changes on EKG, with factor V Leiden deficiency. Has not taken her Coumadin for one week because was traveling. Advise doing catheterization, since V/Q scan is negative. Will  give premedications for dye allergy.   Thank you very much for the referral.    ____________________________ Laurier NancyShaukat A. Akaysha Cobern, MD sak:mr D: 03/20/2013 19:14:42 ET T: 03/20/2013 20:14:40 ET JOB#: [id number]  cc: Laurier NancyShaukat A. Mikhi Athey, MD, <Dictator> Laurier NancySHAUKAT A Torrie Lafavor MD ELECTRONICALLY SIGNED 03/25/2013 8:39

## 2014-06-03 NOTE — Discharge Summary (Signed)
PATIENT NAME:  Selena Conner, Selena Conner MR#:  [id number] DATE OF BIRTH:  06-Dec-1965  DATE OF ADMISSION:  03/23/2013 DATE OF DISCHARGE:  03/24/2013  ADDENDUM  The patient was not discharged as previously thought on 03/22/2013 since she developed acute respiratory distress and failure with decreased respiratory rate because she was given multiple pain medications for multiple pains she was having.  She was transferred to CCU and her pain medications as well as benzodiazepines were stopped.  She was evaluated by Dr. Toni Amendlapacs on 03/23/2013 and felt that the patient should continue Cymbalta as well as Seroquel for her bipolar disorder.  However, he recommended not to restart the patient on Klonopin and recommended to discharge patient back to [place] [place].  He recommended no further changes of her treatment.  Over the next day or so, the patient did progressively better.  She had no significant discomfort or pains.  On 03/24/2013 she requested an enema for her constipation which she was given, after which she produced a small amount of hard pellet stools.  The patient, however, was felt to be stable to be discharged home and she is being discharged home on medications as follows.  Cymbalta 90 mg by mouth daily, Coumadin 5 mg by mouth daily and Seroquel 300 mg by mouth at bedtime.  The patient is to follow up with her primary care physician in the next few days after discharge and was recommended to hold her Coumadin today on 03/24/2013, resume it tomorrow.  She was also recommended to have her Pro Time and INR checked by primary care physician in the next two days after discharge.  On the day of discharge, 03/24/2013 her Pro Time and INR is as follows, Pro Time was 34.1 and INR was 3.5.   TIME SPENT:  40 minutes.    ____________________________ Katharina Caperima Ancil Dewan, MD rv:ea D: 03/24/2013 19:03:11 ET T: 03/24/2013 23:04:46 ET JOB#: [id number]  cc: Katharina Caperima Ridley Dileo, MD, <Dictator> Donzell Coller MD ELECTRONICALLY SIGNED  04/12/2013 21:13

## 2014-06-03 NOTE — Consult Note (Signed)
PATIENT NAME:  Selena Conner, EISINGER MR#:  [id number] DATE OF BIRTH:  06/04/1965  DATE OF CONSULTATION:  03/23/2013  CONSULTING PHYSICIAN:  Audery Amel, MD  IDENTIFYING INFORMATION AND REASON FOR CONSULT: A 49 year old woman currently in the hospital to rule out pulmonary embolism. Consultation for "depression and floating feeling."   HISTORY OF PRESENT ILLNESS: Information obtained from the patient and the chart. The patient came into the hospital on the 8th complaining of chest pain with concerns about pulmonary embolism. She has a history of a clotting deficiency disorder and had been off of her Coumadin for several days. Has a history of pulmonary embolisms in the past. She was passing through town on a [employer] bus when the pain struck, and she asked to be dropped off at the local bus station. Since being in the hospital, she has ruled out for a pulmonary embolism and continued to have chest pain. The patient had an episode yesterday of becoming unresponsive transiently with hypoxia. Responded to Narcan. Appeared to probably be toxic on opiates, although the mechanism of it was unclear. The patient, herself, tells me that all she remembers is that she was talking to the doctors and the next thing she knew that the nurses were shaking her and vacuuming vomit out of her throat. She also tells me that around that time she had a sensation like she was floating. She tells me that actually she still has some of that feeling when she closes her eyes. She does not seem to make very much of it, and it does not appear to be psychotic. She tells me that her mood is a little bit anxious, but does not describe it as depressed. She has chronic insomnia and has not been sleeping well for a couple of days. She denies having any suicidal ideation. She denies any hallucinations. Denies having racing thoughts. She says that she had been compliant with her prescription psychiatric medicine up until the time she was admitted  to the hospital. These medicines are normally Cymbalta 90 mg a day and Seroquel 300 mg at night. She also reports that previously she had been maintained on Klonopin 1 mg 3 times a day, but that she got a new primary care doctor a few months ago who has refused to prescribe the medicine for her, so she has been off it for a while. Her history on this point is not entirely consistent. She seems to indicate that she may have had some Klonopin with her on her trip and may have even had some in her bag. There seems to have possibly been some concern that the patient had taken extra medication in her possession resulting in this spell yesterday. She adamantly denies that. She says the plastic bag of medication in her bag was just Cymbalta and Seroquel, and that she had not taken any extra doses.   PAST PSYCHIATRIC HISTORY: The patient lives in [place], has never been treated at our facility before. Says that she has had mental health problems, however, since she was an adolescent. She had psychiatric hospitalization as a teenager. Has been diagnosed with bipolar disorder and has been on psychiatric medication chronically. The current combination of Cymbalta and Seroquel has been used for years to good effect. She had a suicide attempt as an adolescent but none as an adult. She does not describe regular episodes of psychotic symptoms. Evidently, she gets her psychiatric followup mostly from her primary care doctor down in [place], [place].   PAST  MEDICAL HISTORY: The patient has a factor V, I believe, clotting deficiency. The results is that she is at high risk of clots and emboli and is maintained on Coumadin. Evidently, she has disability for this condition. She had reported that she had not brought her Coumadin with her on her vacation to visit [place], and so had been off it for several days.   SOCIAL HISTORY: The patient is unmarried. She does have adult children with whom she is  occasionally in contact. She lives in [place], [place] with an elderly man she describes as her Writer." She had been traveling to [place] to visit a man she considers her fiance. She says he lives far out in the country and works as a Programmer, multimedia. She was on the [employer] going back to [place] when she was admitted to our hospital.   FAMILY HISTORY: The patient gives an inconsistent history about this. She tells me that her father committed suicide when he was 81. The admitting note reports that the father died in an automobile accident.   SUBSTANCE ABUSE HISTORY: The patient adamantly denies that she abuses alcohol or any drugs, and denies that she has ever abused any of her prescription medicine.   CURRENT MEDICATIONS: Going on the admission note, her medicines were Cymbalta 90 mg per day, Seroquel 300 mg at night, Coumadin 5 mg at bedtime, and at the time she was reporting still being on the clonazepam.   ALLERGIES: CODEINE, IV DYE, KETOROLAC, PENICILLIN AND PNEUMOCOCCAL VACCINES.   REVIEW OF SYSTEMS: She is denying being depressed. Denies suicidal or homicidal ideation. Denies hallucinations. Denies panic attacks. She denies, this evening, any specific pain. Is not feeling short of breath. She says she is feeling very tired and sleepy, but is afraid she will not be able to sleep. The rest of the review of systems is negative.   MENTAL STATUS EXAMINATION: Neatly groomed woman, who has a somewhat childlike air about her. She is cooperative and pleasant with the interview. Makes good eye contact. Psychomotor activity normal. Speech normal rate, tone and volume. Affect is slightly odd and dramatic, again, with an almost childlike air about it. Not frankly bizarre, but markedly odd to my eye. Mood is stated as being okay. Thoughts appear to be lucid and logical. No evidence of delusional thinking or loosening of associations. Denies auditory or visual hallucinations. Denies suicidal  or homicidal ideation. The patient is alert and oriented x 4. Appears to have a normal fund of knowledge. Short-term memory intact, 3 out of 3 objects immediately and at 3 minutes. Long-term memory intact. Able to describe aspects of her life in detail.   LABORATORY RESULTS: The most notable one is that no drug screen was drawn at the time of her admission. It was not apparently thought to be relevant to her workup when she first came into the hospital, so we do not know whether she had been taking any opiates or benzodiazepines at that time. Subsequent to this "spell" she had yesterday, a drug screen was obtained, which is positive for opiates, benzodiazepines and tricyclics, but I do not think that really means anything since we know she had been given opiates and benzodiazepines in the hospital, and the tricyclics probably represents a cross reaction of the Seroquel.   ASSESSMENT: A 49 year old woman currently denies any mood or psychotic symptoms. She has a past history of a diagnosis of bipolar disorder. She is not presenting as being psychotic. There is understandable concern  about what caused her to have this unresponsive spell yesterday. The patient is denying that she misused any medicine, and there is no hard evidence about this. By her testimony, she is not currently being prescribed her clonazepam by her outpatient provider. Right now, to my evaluation, she appears stable.   TREATMENT PLAN: I suggest continuing the Cymbalta and Seroquel as it is currently prescribed. I would suggest not prescribing the clonazepam anymore and not giving her a new prescription at discharge. If possible, contact might be made by social work with her provider in [place] to try and confirm her outpatient followup. I do not think there is any other change in medication necessary.   DIAGNOSIS, PRINCIPAL AND PRIMARY:  AXIS I: Bipolar disorder, not otherwise specified, currently asymptomatic.   SECONDARY  DIAGNOSES: AXIS I: No further diagnosis.  AXIS II: Deferred.  AXIS III: Clotting deficiency. Chest pain of unclear etiology. Unresponsiveness, apparently opiate intoxication.   AXIS IV: Severe from being away from home.  AXIS V: Functioning at time of evaluation, 57.    ____________________________ Audery AmelJohn T. Clapacs, MD jtc:dmm D: 03/23/2013 22:20:37 ET T: 03/23/2013 22:47:42 ET JOB#: [id number]  cc: Audery AmelJohn T. Clapacs, MD, <Dictator> Audery AmelJOHN T CLAPACS MD ELECTRONICALLY SIGNED 03/24/2013 13:06

## 2014-06-03 NOTE — Consult Note (Signed)
Psychiatry: Patient seen. Chart reviewed. Patient tells me today she is feeling "down" and blames it on being constipated. Depression worse today. Denies SI and denies psychosis.pos for depressed mood, fatigue, constipation, weakness. Neg for SI, hallucinations, acute pain. Rest of ROS negativeintact short term and long term Affect blunted. Mood depressed. Denies SI or HI and denies hallucinations. Nml fund of knowledge and A+O x 4change to psych meds. Cont seroquel and cymbalta.colace standing and PRN sennkot for now for C/O constipation. working with her on plan for transport and discharge home.  Electronic Signatures: Audery Amellapacs, Marisella Puccio T (MD)  (Signed on 12-Feb-15 13:35)  Authored  Last Updated: 12-Feb-15 13:35 by Audery Amellapacs, Dari Carpenito T (MD)

## 2014-06-06 ENCOUNTER — Inpatient Hospital Stay
Admit: 2014-06-06 | Discharge: 2014-06-06 | Disposition: A | Discharge status: Home / Self Care | Payer: MEDICARE | Attending: Emergency Medicine

## 2014-06-06 ENCOUNTER — Encounter: Attending: Urology | Primary: Student in an Organized Health Care Education/Training Program

## 2014-06-06 DIAGNOSIS — N390 Urinary tract infection, site not specified: Secondary | ICD-10-CM

## 2014-06-06 LAB — CBC WITH AUTOMATED DIFF
ABS. BASOPHILS: 0 10*3/uL (ref 0.0–0.2)
ABS. EOSINOPHILS: 0.5 10*3/uL (ref 0.0–0.8)
ABS. IMM. GRANS.: 0 10*3/uL (ref 0.0–0.5)
ABS. LYMPHOCYTES: 1.1 10*3/uL (ref 0.5–4.6)
ABS. MONOCYTES: 0.3 10*3/uL (ref 0.1–1.3)
ABS. NEUTROPHILS: 2.6 10*3/uL (ref 1.7–8.2)
BASOPHILS: 1 % (ref 0.0–2.0)
EOSINOPHILS: 11 % — ABNORMAL HIGH (ref 0.5–7.8)
HCT: 31.5 % — ABNORMAL LOW (ref 35.8–46.3)
HGB: 9.9 g/dL — ABNORMAL LOW (ref 11.7–15.4)
IMMATURE GRANULOCYTES: 0.2 % (ref 0.0–5.0)
LYMPHOCYTES: 24 % (ref 13–44)
MCH: 27 PG (ref 26.1–32.9)
MCHC: 31.4 g/dL (ref 31.4–35.0)
MCV: 85.8 FL (ref 79.6–97.8)
MONOCYTES: 8 % (ref 4.0–12.0)
MPV: 10.5 FL — ABNORMAL LOW (ref 10.8–14.1)
NEUTROPHILS: 56 % (ref 43–78)
PLATELET: 421 10*3/uL (ref 150–450)
RBC: 3.67 M/uL — ABNORMAL LOW (ref 4.05–5.25)
RDW: 15.6 % — ABNORMAL HIGH (ref 11.9–14.6)
WBC: 4.5 10*3/uL (ref 4.3–11.1)

## 2014-06-06 LAB — METABOLIC PANEL, BASIC
Anion gap: 11 mmol/L (ref 7–16)
BUN: 10 MG/DL (ref 6–23)
CO2: 20 mmol/L — ABNORMAL LOW (ref 21–32)
Calcium: 8.8 MG/DL (ref 8.3–10.4)
Chloride: 110 mmol/L — ABNORMAL HIGH (ref 98–107)
Creatinine: 0.77 MG/DL (ref 0.6–1.0)
GFR est AA: >60 mL/min/{1.73_m2} (ref >60)
GFR est non-AA: >60 mL/min/{1.73_m2} (ref >60)
Glucose: 100 mg/dL (ref 65–100)
Potassium: 4 mmol/L (ref 3.5–5.1)
Sodium: 141 mmol/L (ref 136–145)

## 2014-06-06 LAB — URINALYSIS W/ RFLX MICROSCOPIC
Bacteria: 0 /hpf
Bilirubin: NEGATIVE
Glucose: NEGATIVE mg/dL
Ketone: NEGATIVE mg/dL
Nitrites: POSITIVE — AB
Protein: 300 mg/dL — AB
RBC: >100 /hpf — ABNORMAL HIGH
Specific gravity: 1.02 (ref 1.001–1.023)
Urobilinogen: 1 EU/dL (ref 0.2–1.0)
pH (UA): 7 (ref 5.0–9.0)

## 2014-06-06 MED ORDER — HYDROMORPHONE (PF) 1 MG/ML IJ SOLN
1 mg/mL | INTRAMUSCULAR | Status: AC
Start: 2014-06-06 — End: 2014-06-06
  Administered 2014-06-06: 22:00:00 via INTRAVENOUS

## 2014-06-06 MED ORDER — SODIUM CHLORIDE 0.9 % IV PIGGY BACK
1 gram | INTRAVENOUS | Status: AC
Start: 2014-06-06 — End: 2014-06-06
  Administered 2014-06-06: 21:00:00 via INTRAVENOUS

## 2014-06-06 MED ORDER — HEPARIN, PORCINE (PF) 100 UNIT/ML IV SYRINGE
100 unit/mL | INTRAVENOUS | Status: DC | PRN
Start: 2014-06-06 — End: 2014-06-06
  Administered 2014-06-06: 23:00:00

## 2014-06-06 MED ORDER — HYDROMORPHONE (PF) 1 MG/ML IJ SOLN
1 mg/mL | INTRAMUSCULAR | Status: AC
Start: 2014-06-06 — End: 2014-06-06
  Administered 2014-06-06: 20:00:00 via INTRAVENOUS

## 2014-06-06 MED ORDER — SODIUM CHLORIDE 0.9% BOLUS IV
0.9 % | Freq: Once | INTRAVENOUS | Status: AC
Start: 2014-06-06 — End: 2014-06-06
  Administered 2014-06-06: 20:00:00 via INTRAVENOUS

## 2014-06-06 MED ORDER — NITROFURANTOIN (25% MACROCRYSTAL FORM) 100 MG CAP
100 mg | ORAL_CAPSULE | Freq: Two times a day (BID) | ORAL | Status: DC
Start: 2014-06-06 — End: 2014-06-06

## 2014-06-06 MED ORDER — HYDROCODONE-ACETAMINOPHEN 7.5 MG-325 MG TAB
ORAL_TABLET | Freq: Four times a day (QID) | ORAL | Status: DC | PRN
Start: 2014-06-06 — End: 2014-07-06

## 2014-06-06 MED ORDER — NITROFURANTOIN (25% MACROCRYSTAL FORM) 100 MG CAP
100 mg | ORAL_CAPSULE | Freq: Two times a day (BID) | ORAL | Status: AC
Start: 2014-06-06 — End: 2014-06-16

## 2014-06-06 MED ORDER — ONDANSETRON (PF) 4 MG/2 ML INJECTION
4 mg/2 mL | INTRAMUSCULAR | Status: AC
Start: 2014-06-06 — End: 2014-06-06
  Administered 2014-06-06: 20:00:00 via INTRAVENOUS

## 2014-06-06 MED FILL — CEFTRIAXONE 1 GRAM SOLUTION FOR INJECTION: 1 gram | INTRAMUSCULAR | Qty: 1

## 2014-06-06 MED FILL — ONDANSETRON (PF) 4 MG/2 ML INJECTION: 4 mg/2 mL | INTRAMUSCULAR | Qty: 2

## 2014-06-06 MED FILL — MONOJECT PREFILL ADVANCED (PF) 100 UNIT/ML INTRAVENOUS SYRINGE: 100 unit/mL | INTRAVENOUS | Qty: 3

## 2014-06-06 MED FILL — HYDROMORPHONE (PF) 1 MG/ML IJ SOLN: 1 mg/mL | INTRAMUSCULAR | Qty: 1

## 2014-06-06 NOTE — ED Notes (Signed)
Reports has a stent in her right kidney that is hurting.  Also ran out of meds for her tib/fib fracture.

## 2014-06-06 NOTE — Telephone Encounter (Signed)
I lmom for her to call back.  We rescheduled the cysto/stent removal for Friday at 10:30 am   rli

## 2014-06-06 NOTE — ED Notes (Signed)
I have reviewed discharge instructions with the patient.  The patient verbalized understanding.  Drowsy drug instructions given to pt.. Pt to waiting room to wait on medicaide Zenaida Niecevan

## 2014-06-06 NOTE — ED Notes (Signed)
Unable to place port.. . Charge rn made aware

## 2014-06-06 NOTE — ED Notes (Signed)
Lab coming to get blood

## 2014-06-06 NOTE — ED Provider Notes (Signed)
HPI Comments: Patient has a right ureteral stent.  This was placed on May 21, 2014. While here on that admission the patient fell in her room and broke her left lower leg.  Comes in today complaining of right flank pain.  Out of her pain medication.  No fever. Supposed to follow-up with urology this morning but missed the appointment because she thought it was 2 days from now.    Patient is a 49 y.o. female presenting with flank pain and medication refill. [name]   Flank Pain   This is a recurrent problem. The current episode started 2 days ago. The problem has been gradually worsening. The problem occurs constantly. Patient reports not work related injury.The pain is associated with no known injury. The pain is present in the lower back and right side. The quality of the pain is described as sharp. The pain does not radiate. Pertinent negatives include no chest pain, no fever, no headaches, no abdominal pain and no dysuria.   Medication Refill  Pertinent negatives include no chest pain, no abdominal pain, no headaches and no shortness of breath.        Past Medical History:   Diagnosis Date   ??? Factor V Leiden mutation (HCC)    ??? Infectious disease 02/2009      Hx MRSA / E.Coli bacteremia, MRSA wound infection 2/2 port   ??? Factor II deficiency (HCC)    ??? Calculus of kidney    ??? Depression    ??? Iron deficiency    ??? Migraines    ??? 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 Bipolar disorder (HCC)    ??? Anxiety    ??? Restless leg syndrome    ??? Hematuria, microscopic        Past Surgical History:   Procedure Laterality Date   ??? Cystoscopy  ???     x 13   ??? Lithotripsy  2006   ??? Hx vascular access  July, 2010     Power Port placed in [place], [place] removed   ??? Hx vascular access Right 2013     pt has current rt subclavian port - placed at [facility]   ??? Hx appendectomy  ???   ??? Hx tonsillectomy      ??? Hx cesarean section     ??? Hx tubal ligation     ??? Hx ovarian cyst removal       on R         Family History:   Problem Relation Age of Onset   ??? Bleeding Prob Father      Factor V   ??? Cancer Maternal Grandmother      metastatic breast CA.   ??? Ovarian Cancer Mother 19     passed away due to ca   ??? Bipolar Disorder Mother    ??? Kidney Disease Brother      kidney stones       History     Social History   ??? Marital Status: DIVORCED     Spouse Name: N/A   ??? Number of Children: N/A   ??? Years of Education: N/A     Occupational History   ??? Not on file.     Social History Main Topics   ??? Smoking status: Never Smoker    ??? Smokeless tobacco: Never Used   ??? Alcohol Use: Yes  Comment: rarely - only drinks 2 times per year   ??? Drug Use: No      Comment: narcotic seeking per hospital hx.   ??? Sexual Activity:     Partners: Male     Birth Control/ Protection: Surgical     Other Topics Concern   ??? Not on file     Social History Narrative           ALLERGIES: Ativan; Codeine; Demerol; Hydrocodone-ibuprofen; Iodinated contrast media - iv dye; Ketorolac tromethamine; Motrin; Pcn; Pneumococcal vaccine; Pneumovax 23; and Shellfish containing products      Review of Systems   Constitutional: Negative for fever and chills.   HENT: Negative for facial swelling, sore throat and trouble swallowing.    Eyes: Negative for pain, discharge and visual disturbance.   Respiratory: Negative for choking, shortness of breath and wheezing.    Cardiovascular: Negative for chest pain, palpitations and leg swelling.   Gastrointestinal: Negative for nausea, vomiting, abdominal pain and diarrhea.   Endocrine: Negative for polydipsia, polyphagia and polyuria.   Genitourinary: Positive for flank pain. Negative for dysuria, frequency and hematuria.   Musculoskeletal: Negative for back pain, arthralgias and neck pain.   Skin: Negative for rash and wound.   Allergic/Immunologic: Negative for immunocompromised state.    Neurological: Negative for dizziness, seizures, syncope and headaches.   Hematological: Negative for adenopathy. Does not bruise/bleed easily.   Psychiatric/Behavioral: Negative for confusion and agitation.       Filed Vitals:    06/06/14 1552 06/06/14 1640 06/06/14 1734 06/06/14 1739   BP: 105/70 102/68 104/68    Pulse: 91 100 104 96   Temp:       Resp: 16 15 16     Height:       Weight:       SpO2: 100% 93% 95% 93%            Physical Exam   Constitutional: She is oriented to person, place, and time. She appears well-developed and well-nourished. She appears distressed.   HENT:   Head: Normocephalic and atraumatic.   Nose: Nose normal.   Mouth/Throat: Oropharynx is clear and moist.   Eyes: Conjunctivae are normal. Pupils are equal, round, and reactive to light. No scleral icterus.   Neck: Normal range of motion. Neck supple. No JVD present. No thyromegaly present.   Pulmonary/Chest: Effort normal and breath sounds normal. She has no wheezes.   Abdominal: Soft. Bowel sounds are normal. She exhibits no mass. There is no guarding.   Musculoskeletal: She exhibits no edema.   Cast on left lower leg   Lymphadenopathy:     She has no cervical adenopathy.   Neurological: She is alert and oriented to person, place, and time. Coordination normal.   Skin: Skin is warm and dry. No rash noted.   Psychiatric: She has a normal mood and affect. Her behavior is normal. Judgment normal.   Nursing note and vitals reviewed.       MDM  Number of Diagnoses or Management Options  Ureteral stent displacement, sequela:   Urinary tract infection, site unspecified:   Diagnosis management comments: Old records reviewed.  Has had ureteral stent since May 21, 2014.  Nontoxic presentation here.  Afebrile.  Normal white blood cell count on her CBC.  Tea colored urine. 50-100wbcs. Urine culture ordered. Given fluids and Rocephin. Consulted Urology and discussed the case by telephone. Dr. Dimple Caseyice recommended d/c home with po abx  and pain medication and stated they would  see in office. P_t agreed to plan       Amount and/or Complexity of Data Reviewed  Clinical lab tests: ordered and reviewed  Review and summarize past medical records: yes        Procedures

## 2014-06-07 NOTE — Telephone Encounter (Signed)
Pt calling and saying she has an appt with her ortho surgeon on Friday at 830 and she will not have time to get the Zenaida Niecevan to here from that appt. [facility]  Last night and told she has infections and [facility]  Please call her.

## 2014-06-07 NOTE — Progress Notes (Signed)
Quick Note:        Treated with Macrobid    ______

## 2014-06-07 NOTE — Telephone Encounter (Signed)
I spoke to Dr Maud Deed'Boyle office and she does have a appt at 9:20 am post op appt.  Arrival time is 8:50 am.  I tried to call the patient and lmom   rli

## 2014-06-08 IMAGING — NM NM LUNG SCAN
2 series · 16 of 16 positions shown · non-contrast
Comparison: DG CHEST 2V dated 03/20/2013

RADIOPHARMACEUTICALS:  41.2 mCi 6c-RRm DTPA aerosol and 4.2 mCi
6c-RRm MAA

CLINICAL DATA: Shortness of breath.  History of blood clots.

EXAM:
NUCLEAR MEDICINE VENTILATION - PERFUSION LUNG SCAN
TECHNIQUE: Ventilation images were obtained in multiple projections using
inhaled aerosol technetium 99 M DTPA. Perfusion images were obtained
in multiple projections after intravenous injection of 6c-RRm MAA.

[Series 1000: lung ventilation · 3.90mm/px · 4 acquisitions, 8 frames shown]
[im 1/4]
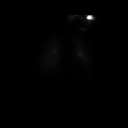
[im 1/4]
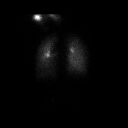
[im 2/4]
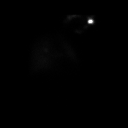
[im 2/4]
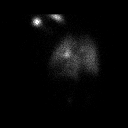
[im 3/4]
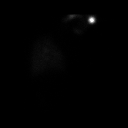
[im 3/4]
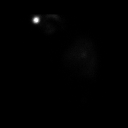
[im 4/4]
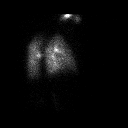
[im 4/4]
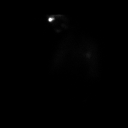

[Series 1000: lung perfusion · 1.95mm/px · 4 acquisitions, 8 frames shown]
[im 1/4]
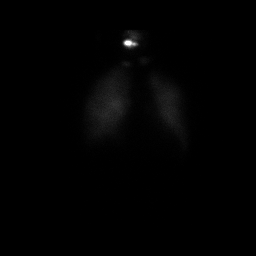
[im 1/4]
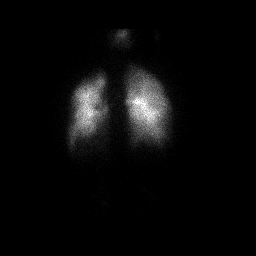
[im 2/4]
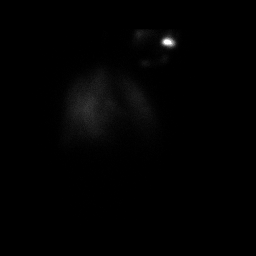
[im 2/4]
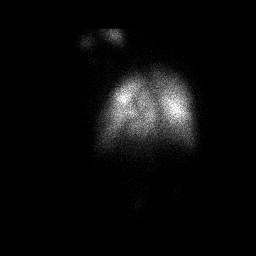
[im 3/4]
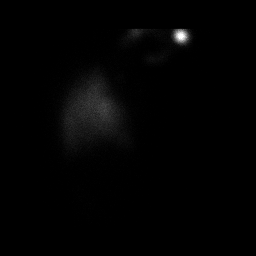
[im 3/4]
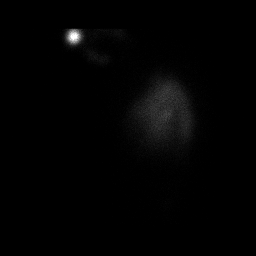
[im 4/4]
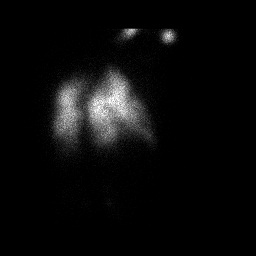
[im 4/4]
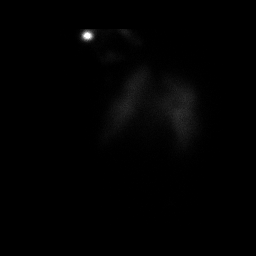

[16 of 16 positions shown; findings below may reference images not displayed]

FINDINGS: Ventilation: No focal ventilation defect.

Perfusion: No wedge shaped peripheral perfusion defects to suggest
acute pulmonary embolism.
IMPRESSION: No evidence of pulmonary embolism.

## 2014-06-08 NOTE — Telephone Encounter (Signed)
I spoke to the patient and we moved the cysto to Monday and she will arrive at 10 on 06/12/14.  She will call and confirm her ride and call us back  rli

## 2014-06-09 ENCOUNTER — Encounter: Attending: Urology | Primary: Student in an Organized Health Care Education/Training Program

## 2014-06-09 NOTE — Progress Notes (Signed)
Quick Note:        Given Rocephin IV in the ED and placed on Macrobid by mouth at home. Await final culture susceptibilities.    ______

## 2014-06-10 LAB — CULTURE, URINE: Culture result:: >100000

## 2014-06-12 ENCOUNTER — Ambulatory Visit
Admit: 2014-06-12 | Discharge: 2014-06-12 | Payer: MEDICARE | Attending: Urology | Primary: Student in an Organized Health Care Education/Training Program

## 2014-06-12 DIAGNOSIS — N200 Calculus of kidney: Secondary | ICD-10-CM

## 2014-06-12 LAB — AMB POC URINALYSIS DIP STICK AUTO W/ MICRO (PGU)
Bilirubin (UA POC): NEGATIVE
Glucose (UA POC): NEGATIVE mg/dL
Ketones (UA POC): NEGATIVE
Nitrites (UA POC): POSITIVE
Protein (UA POC): 300
Specific gravity (UA POC): 1.03 (ref 1.001–1.035)
Urobilinogen (POC): 0.2
pH (UA POC): 6.5 (ref 4.6–8.0)

## 2014-06-12 NOTE — Progress Notes (Signed)
see today's procedure note for further details  tpd

## 2014-06-12 NOTE — Procedures (Signed)
[facility]  [address]  [place], [place] [address]  [phone]    Krista Lopez  DOB: May 28, 1965         HPI   49 y.o., female status post right  ureteroscopy and laser lithotripsy and stent placement on 05/15/2014. At  that time she had a 1.1 cm stone in the right mid ureter. She has had  numerous stones going back to age 44. On 05/19/2014, she had a followup  in the office. KUB did not show any obvious stones and her stent was  removed at that time. Later that day, she came back to the ER having  severe pain in the right flank. She was admitted for pain control.  ??: She was admitted for pain control. CT scan was done and  showed severe hydronephrosis on the right side, but no stones. She was  taken to the OR on 05/21/2014. Had difficult stent placement secondary to  edema of the right ureterovesical junction.  ??  The following day, she continued to have pain which was too severe for  discharge. Her Foley catheter was removed. She did better after that and  was discharged home on 05/23/2014. She fell after discharge and sustained a left tib-fib fracture. This has been repaired by Dr. Maud Deed.   KUB shows stent on right , no stones.   ??    Past Medical History   Diagnosis Date   ??? Factor V Leiden mutation (HCC)    ??? Infectious disease 02/2009      Hx MRSA / E.Coli bacteremia, MRSA wound infection 2/2 port   ??? Factor II deficiency (HCC)    ??? Calculus of kidney    ??? Depression    ??? Iron deficiency    ??? Migraines    ??? 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 Bipolar disorder (HCC)    ??? Anxiety    ??? Restless leg syndrome    ??? Hematuria, microscopic      Past Surgical History   Procedure Laterality Date   ??? Cystoscopy  ???     x 13   ??? Lithotripsy  2006   ??? Hx vascular access  July, 2010     Power Port placed in [place], [place]., removed   ??? Hx vascular access Right 2013     pt has current rt subclavian port - placed [facility]    ??? Hx appendectomy  ???   ??? Hx tonsillectomy     ??? Hx cesarean section     ??? Hx tubal ligation     ??? Hx ovarian cyst removal       on R     Current Outpatient Prescriptions   Medication Sig Dispense Refill   ??? nitrofurantoin, macrocrystal-monohydrate, (MACROBID) 100 mg capsule Take 1 Cap by mouth two (2) times a day for 10 days. 20 Cap 0   ??? cholecalciferol (VITAMIN D3) 1,000 unit cap Take  by mouth daily.     ??? ondansetron (ZOFRAN ODT) 8 mg disintegrating tablet Take 1 Tab by mouth every twelve (12) hours as needed for Nausea. 10 Tab 0   ??? pantoprazole (PROTONIX) 40 mg tablet Take 40 mg by mouth two (2) times a day.     ??? zolpidem (AMBIEN) 10 mg tablet Take  by mouth nightly as needed for Sleep.     ??? butalbital-acetaminophen-caffeine (FIORICET, ESGIC) 50-325-40 mg per tablet Take 1 Tab  by mouth every four (4) hours as needed for Pain or Headache. Max Daily Amount: 6 Tabs. 20 Tab 0   ??? SUMAtriptan (IMITREX) 100 mg tablet Take 1 Tab by mouth once as needed for Migraine. 20 Tab 0   ??? DULoxetine (CYMBALTA) 60 mg capsule Take 3 caps every day.  Indications: ANXIETY WITH DEPRESSION (Patient taking differently: 30 mg two (2) times a day. Pt states she take  BID  Indications: ANXIETY WITH DEPRESSION) 90 Cap 0   ??? clonazePAM (KLONOPIN) 1 mg tablet Take 1 mg by mouth three (3) times daily.     ??? HYDROcodone-acetaminophen (NORCO) 7.5-325 mg per tablet Take 1 Tab by mouth every six (6) hours as needed for Pain. Max Daily Amount: 4 Tabs. 20 Tab 0     Allergies   Allergen Reactions   ??? Ativan [Lorazepam] Itching   ??? Codeine Rash   ??? Demerol [Meperidine] Itching     *     ??? Hydrocodone-Ibuprofen Unknown (comments)   ??? Iodinated Contrast Media - Iv Dye Rash     States also has swelling, and allergic to shellfish   ??? Ketorolac Tromethamine Rash   ??? Motrin [Ibuprofen] Other (comments)     Causes stomach upset after 3 doses.   ??? Pcn [Penicillins] Hives   ??? Pneumococcal Vaccine Swelling    ??? Pneumovax 23 [Pneumococcal 23-Val Ps Vaccine] Other (comments)     Local reaction   ??? Shellfish Containing Products Rash     History     Social History   ??? Marital Status: DIVORCED     Spouse Name: N/A   ??? Number of Children: N/A   ??? Years of Education: N/A     Occupational History   ??? Not on file.     Social History Main Topics   ??? Smoking status: Never Smoker    ??? Smokeless tobacco: Never Used   ??? Alcohol Use: Yes      Comment: rarely - only drinks 2 times per year   ??? Drug Use: No      Comment: narcotic seeking per hospital hx.   ??? Sexual Activity:     Partners: Male     Birth Control/ Protection: Surgical     Other Topics Concern   ??? Not on file     Social History Narrative     Family History   Problem Relation Age of Onset   ??? Bleeding Prob Father      Factor V   ??? Cancer Maternal Grandmother      metastatic breast CA.   ??? Ovarian Cancer Mother 55     passed away due to ca   ??? Bipolar Disorder Mother    ??? Kidney Disease Brother      kidney stones       BP 137/82 mmHg   Temp(Src) 98.1 ??F (36.7 ??C)   Ht  (1.626 m)   Wt 151 lb (68.493 kg)   BMI 25.91 kg/m2   LMP 07/28/2004    UA - Dipstick  Results for orders placed or performed in visit on 06/12/14   AMB POC URINALYSIS DIP STICK AUTO W/ MICRO (PGU)     Status: None   Result Value Ref Range Status    Glucose (UA POC) Negative Negative mg/dL Final    Bilirubin (UA POC) Negative Negative Final    Ketones (UA POC) Negative Negative Final    Specific gravity (UA POC) 1.030 1.001 - 1.035 Final  Blood (UA POC) Large Negative Final    pH (UA POC) 6.5 4.6 - 8.0 Final    Protein (UA POC) 300  Negative Final    Urobilinogen (POC) 0.2 mg/dL  Final    Nitrites (UA POC) Positive Negative Final    Leukocyte esterase (UA POC) Small Negative Final       UA - Micro  WBC - 0  RBC - 0  Bacteria - 0  Epith - 0      Cystoscopy Procedure:    All risks, benefits and alternatives were again reviewed with patient and  she is willing to proceed at this time.  Her genital area was prepped and draped and a sterile field applied. 2% lidocaine jelly was injected in the the urethra and allowed to dwell for several minutes.  A flexible cystoscope was then inserted into the urethral meatus and advanced under direct vision.  The urethra appeared normal with no obstructions.  The bladder neck was open without scarring, contractures, frons or tumors present. The bladder was systematically surveyed.  No bladder trabeculations were seen.  No mucosal abnormalities were seen.  The ureteral orifices were seen in their normal orthotopic position.Removed right stent with flexible forceps.   The cystoscope was then removed under direct vision.  The patient tolerated the procedure well.    Assessment and Plan    ICD-10-CM ICD-9-CM    1. Calculus of kidney N20.0 592.0 AMB POC XRAY, ABDOMEN; SINGLE ANTEROP      CYSTOSCOPY,REMV CALCULUS,SIMPLE      AMB POC URINALYSIS DIP STICK AUTO W/ MICRO (PGU)   2. Ureteral stone N20.1 592.1 AMB POC XRAY, ABDOMEN; SINGLE ANTEROP      CYSTOSCOPY,REMV CALCULUS,SIMPLE      AMB POC URINALYSIS DIP STICK AUTO W/ MICRO (PGU)     Orders Placed This Encounter   ??? CYSTOSCOPY,REMV CALCULUS,SIMPLE   ??? AMB POC XRAY, ABDOMEN; SINGLE ANTEROP     Order Specific Question:  Reason for Exam     Answer:  [id number]     Order Specific Question:  Is Patient Allergic to Contrast Dye?     Answer:  Unknown     Order Specific Question:  Is Patient Pregnant?     Answer:  No   ??? AMB POC URINALYSIS DIP STICK AUTO W/ MICRO (PGU)     Follow-up Disposition:  Return in about 1 year (around 06/12/2015) for KUB, Return to see nurse practitioner.

## 2014-06-12 NOTE — Procedures (Signed)
[facility]  [address]  [place], [place] [address]  [phone]    Krista Lopez  DOB: 1965/08/23         HPI   49 y.o., female status post right  ureteroscopy and laser lithotripsy and stent placement on 05/15/2014. At  that time she had a 1.1 cm stone in the right mid ureter. She has had  numerous stones going back to age 60. On 05/19/2014, she had a followup  in the office. KUB did not show any obvious stones and her stent was  removed at that time. Later that day, she came back to the ER having  severe pain in the right flank. She was admitted for pain control.  ??: She was admitted for pain control. CT scan was done and  showed severe hydronephrosis on the right side, but no stones. She was  taken to the OR on 05/21/2014. Had difficult stent placement secondary to  edema of the right ureterovesical junction.  ??  The following day, she continued to have pain which was too severe for  discharge. Her Foley catheter was removed. She did better after that and  was discharged home on 05/23/2014. She fell after discharge and sustained a left tib-fib fracture. This has been repaired by Dr. Maud Deed.   KUB shows stent on right , no stones.   ??    Past Medical History   Diagnosis Date   ??? Factor V Leiden mutation (HCC)    ??? Infectious disease 02/2009      Hx MRSA / E.Coli bacteremia, MRSA wound infection 2/2 port   ??? Factor II deficiency (HCC)    ??? Calculus of kidney    ??? Depression    ??? Iron deficiency    ??? Migraines    ??? 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 Bipolar disorder (HCC)    ??? Anxiety    ??? Restless leg syndrome    ??? Hematuria, microscopic      Past Surgical History   Procedure Laterality Date   ??? Cystoscopy  ???     x 13   ??? Lithotripsy  2006   ??? Hx vascular access  July, 2010     Power Port placed in [place], [place] removed   ??? Hx vascular access Right 2013     pt has current rt subclavian port - [facility]   ??? Hx  appendectomy  ???   ??? Hx tonsillectomy     ??? Hx cesarean section     ??? Hx tubal ligation     ??? Hx ovarian cyst removal       on R     Current Outpatient Prescriptions   Medication Sig Dispense Refill   ??? nitrofurantoin, macrocrystal-monohydrate, (MACROBID) 100 mg capsule Take 1 Cap by mouth two (2) times a day for 10 days. 20 Cap 0   ??? cholecalciferol (VITAMIN D3) 1,000 unit cap Take  by mouth daily.     ??? ondansetron (ZOFRAN ODT) 8 mg disintegrating tablet Take 1 Tab by mouth every twelve (12) hours as needed for Nausea. 10 Tab 0   ??? pantoprazole (PROTONIX) 40 mg tablet Take 40 mg by mouth two (2) times a day.     ??? zolpidem (AMBIEN) 10 mg tablet Take  by mouth nightly as needed for Sleep.     ??? butalbital-acetaminophen-caffeine (FIORICET, ESGIC) 50-325-40 mg per tablet Take 1 Tab  by mouth every four (4) hours as needed for Pain or Headache. Max Daily Amount: 6 Tabs. 20 Tab 0   ??? SUMAtriptan (IMITREX) 100 mg tablet Take 1 Tab by mouth once as needed for Migraine. 20 Tab 0   ??? DULoxetine (CYMBALTA) 60 mg capsule Take 3 caps every day.  Indications: ANXIETY WITH DEPRESSION (Patient taking differently: 30 mg two (2) times a day. Pt states she take  BID  Indications: ANXIETY WITH DEPRESSION) 90 Cap 0   ??? clonazePAM (KLONOPIN) 1 mg tablet Take 1 mg by mouth three (3) times daily.     ??? HYDROcodone-acetaminophen (NORCO) 7.5-325 mg per tablet Take 1 Tab by mouth every six (6) hours as needed for Pain. Max Daily Amount: 4 Tabs. 20 Tab 0     Allergies   Allergen Reactions   ??? Ativan [Lorazepam] Itching   ??? Codeine Rash   ??? Demerol [Meperidine] Itching     *     ??? Hydrocodone-Ibuprofen Unknown (comments)   ??? Iodinated Contrast Media - Iv Dye Rash     States also has swelling, and allergic to shellfish   ??? Ketorolac Tromethamine Rash   ??? Motrin [Ibuprofen] Other (comments)     Causes stomach upset after 3 doses.   ??? Pcn [Penicillins] Hives   ??? Pneumococcal Vaccine Swelling   ??? Pneumovax 23 [Pneumococcal 23-Val Ps Vaccine]  Other (comments)     Local reaction   ??? Shellfish Containing Products Rash     History     Social History   ??? Marital Status: DIVORCED     Spouse Name: N/A   ??? Number of Children: N/A   ??? Years of Education: N/A     Occupational History   ??? Not on file.     Social History Main Topics   ??? Smoking status: Never Smoker    ??? Smokeless tobacco: Never Used   ??? Alcohol Use: Yes      Comment: rarely - only drinks 2 times per year   ??? Drug Use: No      Comment: narcotic seeking per hospital hx.   ??? Sexual Activity:     Partners: Male     Birth Control/ Protection: Surgical     Other Topics Concern   ??? Not on file     Social History Narrative     Family History   Problem Relation Age of Onset   ??? Bleeding Prob Father      Factor V   ??? Cancer Maternal Grandmother      metastatic breast CA.   ??? Ovarian Cancer Mother 28     passed away due to ca   ??? Bipolar Disorder Mother    ??? Kidney Disease Brother      kidney stones       BP 137/82 mmHg   Temp(Src) 98.1 ??F (36.7 ??C)   Ht  (1.626 m)   Wt 151 lb (68.493 kg)   BMI 25.91 kg/m2   LMP 07/28/2004    UA - Dipstick  Results for orders placed or performed in visit on 06/12/14   AMB POC URINALYSIS DIP STICK AUTO W/ MICRO (PGU)     Status: None   Result Value Ref Range Status    Glucose (UA POC) Negative Negative mg/dL Final    Bilirubin (UA POC) Negative Negative Final    Ketones (UA POC) Negative Negative Final    Specific gravity (UA POC) 1.030 1.001 - 1.035 Final  Blood (UA POC) Large Negative Final    pH (UA POC) 6.5 4.6 - 8.0 Final    Protein (UA POC) 300  Negative Final    Urobilinogen (POC) 0.2 mg/dL  Final    Nitrites (UA POC) Positive Negative Final    Leukocyte esterase (UA POC) Small Negative Final       UA - Micro  WBC - 0  RBC - 0  Bacteria - 0  Epith - 0      Cystoscopy Procedure:    All risks, benefits and alternatives were again reviewed with patient and she is willing to proceed at this time.  Her genital area was prepped and draped and a sterile field applied.  2% lidocaine jelly was injected in the the urethra and allowed to dwell for several minutes.  A flexible cystoscope was then inserted into the urethral meatus and advanced under direct vision.  The urethra appeared normal with no obstructions.  The bladder neck was open without scarring, contractures, frons or tumors present. The bladder was systematically surveyed.  No bladder trabeculations were seen.  No mucosal abnormalities were seen.  The ureteral orifices were seen in their normal orthotopic position.Removed right stent with flexible forceps.   The cystoscope was then removed under direct vision.  The patient tolerated the procedure well.    Assessment and Plan    ICD-10-CM ICD-9-CM    1. Calculus of kidney N20.0 592.0 AMB POC XRAY, ABDOMEN; SINGLE ANTEROP      CYSTOSCOPY,REMV CALCULUS,SIMPLE      AMB POC URINALYSIS DIP STICK AUTO W/ MICRO (PGU)   2. Ureteral stone N20.1 592.1 AMB POC XRAY, ABDOMEN; SINGLE ANTEROP      CYSTOSCOPY,REMV CALCULUS,SIMPLE      AMB POC URINALYSIS DIP STICK AUTO W/ MICRO (PGU)     Orders Placed This Encounter   ??? CYSTOSCOPY,REMV CALCULUS,SIMPLE   ??? AMB POC XRAY, ABDOMEN; SINGLE ANTEROP     Order Specific Question:  Reason for Exam     Answer:  [id number]     Order Specific Question:  Is Patient Allergic to Contrast Dye?     Answer:  Unknown     Order Specific Question:  Is Patient Pregnant?     Answer:  No   ??? AMB POC URINALYSIS DIP STICK AUTO W/ MICRO (PGU)     Follow-up Disposition:  Return in about 1 year (around 06/12/2015) for KUB, Return to see nurse practitioner.

## 2014-06-14 ENCOUNTER — Encounter: Payer: MEDICARE | Attending: Internal Medicine | Primary: Student in an Organized Health Care Education/Training Program

## 2014-06-15 ENCOUNTER — Inpatient Hospital Stay: Admit: 2014-06-15 | Discharge: 2014-06-15 | Payer: MEDICARE | Attending: Emergency Medicine

## 2014-06-15 NOTE — ED Notes (Signed)
Pt was called three times before she answered and was taken to a room.

## 2014-06-15 NOTE — ED Notes (Signed)
Patient was seen outside possibly smoking.

## 2014-06-15 NOTE — ED Notes (Signed)
Pt was seen 4MAY16, dx with PE. Pt is unable to get into to see her PCP, and pain has worsened.

## 2014-06-15 NOTE — ED Notes (Signed)
Patient was seen leaving via the back hallway. She was overheard saying [facility] would [facility] her [facility] immediately due to her saying she had a blood clot.

## 2014-06-15 NOTE — ED Notes (Signed)
No response from pt. In waiting room.

## 2014-06-19 ENCOUNTER — Encounter: Attending: Urology | Primary: Student in an Organized Health Care Education/Training Program

## 2014-06-22 ENCOUNTER — Inpatient Hospital Stay: Admit: 2014-06-22 | Payer: MEDICARE | Primary: Student in an Organized Health Care Education/Training Program

## 2014-06-22 DIAGNOSIS — M25572 Pain in left ankle and joints of left foot: Secondary | ICD-10-CM

## 2014-06-22 NOTE — Progress Notes (Signed)
[name]   [name], [address] [place], [place] [id number]  Phone: [phone]   Fax: [phone]    Outpatient PHYSICAL THERAPY: Initial Assessment 06/22/2014  Fall Risk Score: 1 (? 5 = High Risk)    ICD-10: Treatment Diagnosis: Stiffness of left ankle, not elsewhere classified (M25.672), Pain in left ankle and joints of left foot (M25.572)  REFERRING PHYSICIAN: Overton Mamboyle, Michael J, MD  MD Orders: ROM Ankle, Knee, gentle strengthening, NWB  Return Physician Appointment: 06/30/14  MEDICAL/REFERRING DIAGNOSIS: s/p L ORIF tibia/fib  DATE OF ONSET: 05/31/2014  PRIOR LEVEL OF FUNCTION: no difficulty with walking, stairs, WB activities  PRECAUTIONS/ALLERGIES: pt is on blood thinner med/allergies: codeine, penacillin, toracol, IVP contrast, shell fish, pneumonia vaccine.  ASSESSMENT:  ????????This section established at most recent assessment??????????  [name] is a 49 year old female pt that is s/p L ORIF of tibia and fibula after falling at hospital.  She presents with decreased ROM, strength and swelling at ankle as expected and is to be NWB for at least 12 weeks post surgery. She has PMHx of Factor V and II which increases tendency towards blood clotting that will need to be monitored.  She normally is very active in walking, running, swimming and struggles with depression and Bipolor disorder, so will need help with alternate activities she can do in the mean time.  She normally helps maintain her house as well as cooking but is relying on help with this currently.  She is a good candidate for skilled physical therapy for rehabilitation towards improved independence with functional activities.    PROBLEM LIST (Impairments causing functional limitations):  1. Decreased Strength affecting function  2. Edema affecting function  3. Decreased ADL/Functional Activities  4. Decreased Flexibility/joint mobility  5. Increased Pain affecting function   GOALS: (Goals have been discussed and agreed upon with patient.)  SHORT-TERM FUNCTIONAL GOALS: Time Frame: 4 weeks  1.  L ankle DF > 5 degrees, PF > 40 degrees, inversion > 25 degrees AROM for improved mobility.  2.  Independent with entry level HEP.  3.  L hip, ankle and knee strength >/= 4/5 for return to WB activity  DISCHARGE GOALS: Time Frame: 8-12 weeks  1. L ankle ROM = R ankle ROM in all planes  2. Tolerates walking with lease assistive device 1 mile for community integration.  3. Good balance with single leg stance/weight shift activities.  4. Good strength with up/down 1 set of stairs.  5.  FAAM score >67 for improved overall function.  REHABILITATION POTENTIAL FOR STATED GOALS: GoodPLAN OF CARE:  INTERVENTIONS PLANNED: (Benefits and precautions of physical therapy have been discussed with the patient.)  1. Modalities PRN, including ultrasound, estim, and iontophoresis  2. Soft tissue and joint mobilization for ROM and flexibility  3. Stretching, progressive resistive exercises and HEP for return to functional activities.   4. Back Education and Training for body mechanics with activities of daily living  5. If needed, aquatic therapy.    TREATMENT PLAN EFFECTIVE DATES: 06/22/14 TO 09/22/14  FREQUENCY/DURATION: Follow patient 2 times a week for 12 weeks to address above goals.  Regarding [name]'s therapy, I certify that the treatment plan above will be carried out by a therapist or under their direction.  Thank you for this referral,  Gwendolyn LimaLisa B Fredrick Ge[place]hegan, PT     Referring Physician Signature: Overton Mamboyle, Michael J, MD          Date  SUBJECTIVE:  Goal= return to 90 to 95% of where she was at.  Wants to be able to walk her dog.  History of Present Injury/Illness (Reason for Referral): Pt was at hospital having 14th cystoscopy April 15th and as she turned to leave nurse's station she states that her ankle "gave way."  She is now s/p L  ORIF of lateral malleolus and distal tibia on 05/31/14.  Her orders are NWB for 12 weeks.  She states her depression in worsening because of having to stay home and not be mobile.  Present Symptoms: States her lateral and medial ankle feel like they are "on fire" at times and her boot rubs against the tender points.  She has been compliant with NWB.  Pain Intensity 1: 8  Dominant Side: right  Past Medical History: has a past medical history of Factor V Leiden mutation (HCC); Infectious disease (02/2009); Factor II deficiency (HCC); Calculus of kidney; Depression; Iron deficiency; Migraines; Pulmonary embolism (HCC) (Multiple episodes); Bipolar disorder (HCC); Anxiety; Restless leg syndrome; and Hematuria, microscopic.  Past Surgical History:  has past surgical history that includes cystoscopy (???); lithotripsy (2006); vascular access (July, 2010); vascular access (Right, 2013); appendectomy (???); tonsillectomy; cesarean section; tubal ligation; and ovarian cyst removal. cystoscopy x 14; Green Full Filter for restraining blood clot   Current Medications: Norco, vit D3, zofran, protonix, ambien, fioricet/esgic, imitrex, cymbalta, klonopin, lovalex (blood thinner)   Date Last Reviewed: 06/22/14  Social History/Home Situation: lives with female room mate who does most of the cooking for her and helps her with house cleaning.  Work/Activity History: on disability because of blood clot disorder/previous to this injury pt liked to run, walk, swim, hike  OBJECTIVE:  Outcome Measure:   Tool Used: PT/OT FOOT AND ANKLE ABILITY MEASURE  Score:  Initial: 21/84 Most Recent: X (Date: -- )   Interpretation of Score: For the "Activities of Daily Living", there are 21 questions each scored on a 5 point scale with 0 representing "Unable to do" and 4 representing "No difficulty".  The lower the score, the greater the functional disability. 84/84 represents no disability.  Minimal  detectable change is 5.7 points.  With the addition of the 8 questions in the "Sports Subscale," there are 29 questions, each scored on a 5 point scale with 0 representing "Unable to do" and 4 representing "No difficulty".  The lower the score, the greater the functional disability. 116/116 represents no disability.  Minimal detectable change is 12.3 points.    Activities of Daily Living:  Score 84 83-68 67-51 50-34 33-18 17-1 0   Modifier CH CI CJ CK CL CM CN     ? Mobility - Walking and Moving Around:    A5409 - CURRENT STATUS: CL - 60%-79% impaired, limited or restricted   W1191 - GOAL STATUS:  CI - 1%-19% impaired, limited or restricted   Y7829 - D/C STATUS:  ---------------To be determined---------------    Observation/Orthostatic Postural Assessment:  Walking:  Pt walks into clinic with 2 crutches, NWB L LE.  She seems to have good control of crutches.  Pt presents with 2 ace bandages around LE; she has 4 incisional scars: 2 at lateral and medical ankle and 2 proximal to mid tibia.  No weeping of wound or incisions noted.  Steri strips intact at distal medial/lateral incisions. Moderate swelling at ankle  Palpation:  Decreased mobility at distal lateral and medial incisions.  ROM:  LLE AROM  L Knee Flexion: 130  L Knee Extension: 0  L Ankle Dorsiflexion: 0  L Ankle Plantar Flexion: 25  L Ankle Eversion: 5  L Ankle Inversion: 12  LLE PROM  L Ankle Dorsiflexion: 3  L Ankle Plantar Flexion: 30  L Ankle Eversion: 8  L Ankle Inversion: 15           Strength:  L hip abd = 4/5                 Neurological Screen: not assessed today  Functional Mobility: independent with compensation  Balance:  Orders are NWB L.  Pt negotiates walking with crutches well.  TREATMENT:    (In addition to Assessment/Re-Assessment sessions the following treatments were rendered)  Therapeutic Exercise: (12 Minutes): ankle DF/PF, inversion/eversion AROM, SLR and hip abd, knee flex/extend-  All 10x   For core building:  BIL hip flexion prolonged isometric hold, BIL hip ext prolonged isometric hold, and BIL hip flexion with hands crossed prolonged isometric  hold  Manual Therapy (     7 min) gentle ROM for ankle DF/PF, inversion/eversion; gentle  myofascial release around proximal incisions.   Therapeutic Modalities:                                                                                               HEP: As above; handouts given to patient for all exercises.  ______________________________________________________________________________________________________    Treatment Assessment:  Pt tolerated motion at ankle well.  Kept exercises to a minimum secondary to patient not wearing TED hose today.  She agreed to bring them in next session for help to put them on.  Was not able to don them on her own.  Progression/Medical Necessity:   ?? Patient is expected to demonstrate progress in strength, range of motion and balance to increase independence with walking, WB activities..  Compliance with Program/Exercises: Will assess as treatment progresses.   Reason for Continuation of Services/Other Comments:  ?? Patient continues to require skilled intervention due to needs skilled guidance for HEP, manual for ROM.Marland Kitchen.  Recommendations/Intent for next treatment session: "Treatment next visit will focus on continue with ROM, gentle strengthening.  Don TED stocking and use game ready to decrease swelling.".    Total Treatment Duration: 65 min  PT Patient Time In/Time Out  Time In: 1305  Time Out: 1410    Gwendolyn LimaLisa B Tandy Grawe, PT MSPT

## 2014-06-23 NOTE — Progress Notes (Signed)
MSSP follow up call to Krista Lopez to see how she was doing. She tells me she has good pain control so far, started physical therapy this week and says this is hard, but she feels she will get better. She tells me she has no CM needs at this time, but has my contact information should she have concerns or questions. I scheduled a call back to her in a few weeks to check her status.

## 2014-06-23 NOTE — Progress Notes (Signed)
Ambulatory/Rehab Services H2 Model Falls Risk Assessment    Risk Factor Pts. ??   Confusion/Disorientation/Impulsivity      4 ??   Symptomatic Depression     2 ??   Altered Elimination     1 ??   Dizziness/Vertigo     1 ??   Gender (Female)     1 ??   Any administered antiepileptics (anticonvulsants):     2 ??   Any administered benzodiazepines:     1 ??   Visual Impairment (specify):     1 ??   Portable Oxygen Use     1 ??   Orthostatic ? BP     1 ??   History of Recent Falls (within 3 mos.)     5     Ability to Rise from Chair (choose one) Pts. ??   Ability to rise in a single movement     0 ??   Pushes up, successful in one attempt     1 ??   Multiple attempts, but successful     3 ??   Unable to rise without assistance     4   Total: (5 or greater = High Risk) 1     Falls Prevention Plan:                   Physical Limitations to Exercise (specify):                   Mobility Assistance Device (type):                   Exercise/Equipment Adaptation (specify):    ??2010 [employer] All Rights Reserved. [place] Patent #7,[id number]. Federal Law prohibits the replication, distribution or use without written permission from [employer]

## 2014-06-26 ENCOUNTER — Emergency Department: Admit: 2014-06-26 | Payer: MEDICARE | Primary: Student in an Organized Health Care Education/Training Program

## 2014-06-26 ENCOUNTER — Inpatient Hospital Stay
Admit: 2014-06-26 | Discharge: 2014-06-26 | Disposition: A | Discharge status: Home / Self Care | Payer: MEDICARE | Attending: Emergency Medicine

## 2014-06-26 DIAGNOSIS — M79662 Pain in left lower leg: Secondary | ICD-10-CM

## 2014-06-26 NOTE — ED Notes (Signed)
I have reviewed discharge instructions with the patient.  The patient verbalized understanding.

## 2014-06-26 NOTE — ED Notes (Signed)
Pt had leg surgery on 06-30-14. Pt fell today.

## 2014-06-26 NOTE — ED Provider Notes (Signed)
HPI Comments: Patient states that she had surgery on her left leg on April 20.  She states that she has been nonweightbearing since then and has been ambulating with the help of crutches. She was getting out of the shower today when she fell when her crutch slipped out from under her left arm.  She states she fell onto her left leg.  She is had sharp nonradiating pain since then and she is worried that she might have messed up her surgical fixation of her leg.  She denies taking a medicine for her symptoms and she denies any aggravating or alleviating factors    Patient is a 49 y.o. female presenting with leg pain. [name]   Leg Pain          Past Medical History:   Diagnosis Date   ??? Factor V Leiden mutation (HCC)    ??? Infectious disease 02/2009      Hx MRSA / E.Coli bacteremia, MRSA wound infection 2/2 port   ??? Factor II deficiency (HCC)    ??? Calculus of kidney    ??? Depression    ??? Iron deficiency    ??? Migraines    ??? 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 Bipolar disorder (HCC)    ??? Anxiety    ??? Restless leg syndrome    ??? Hematuria, microscopic        Past Surgical History:   Procedure Laterality Date   ??? Cystoscopy  ???     x 13   ??? Lithotripsy  2006   ??? Hx vascular access  July, 2010     [place]   ??? Hx vascular access Right 2013     pt has [facility]   ??? Hx appendectomy  ???   ??? Hx tonsillectomy     ??? Hx cesarean section     ??? Hx tubal ligation     ??? Hx ovarian cyst removal       on R         Family History:   Problem Relation Age of Onset   ??? Bleeding Prob Father      Factor V   ??? Cancer Maternal Grandmother      metastatic breast CA.   ??? Ovarian Cancer Mother 326     passed away due to ca   ??? Bipolar Disorder Mother    ??? Kidney Disease Brother      kidney stones       History     Social History   ??? Marital Status: DIVORCED      Spouse Name: N/A   ??? Number of Children: N/A   ??? Years of Education: N/A     Occupational History   ??? Not on file.     Social History Main Topics   ??? Smoking status: Never Smoker    ??? Smokeless tobacco: Never Used   ??? Alcohol Use: Yes      Comment: rarely - only drinks 2 times per year   ??? Drug Use: No      Comment: narcotic seeking per hospital hx.   ??? Sexual Activity:     Partners: Male     Birth Control/ Protection: Surgical     Other Topics Concern   ??? Not on file  Social History Narrative           ALLERGIES: Ativan; Codeine; Demerol; Hydrocodone-ibuprofen; Iodinated contrast media - iv dye; Ketorolac tromethamine; Motrin; Pcn; Pneumococcal vaccine; Pneumovax 23; and Shellfish containing products      Review of Systems   Constitutional: Negative for fever and chills.   Gastrointestinal: Negative for nausea and vomiting.   All other systems reviewed and are negative.      Filed Vitals:    06/26/14 1733   BP: 141/83   Pulse: 94   Temp: 97.9 ??F (36.6 ??C)   Resp: 16   Height: 5\' 4"  (1.626 m)   Weight: 66.225 kg (146 lb)   SpO2: 97%            Physical Exam   Constitutional: She is oriented to person, place, and time. She appears well-developed and well-nourished.   HENT:   Head: Normocephalic and atraumatic.   Eyes: Conjunctivae are normal. Pupils are equal, round, and reactive to light.   Neck: Normal range of motion. Neck supple.   Musculoskeletal: She exhibits no edema or tenderness.   Left leg Ace wrap in place.  Left foot neurovascularly intact with good pulses and normal sensation.  Toes are warm.   No obvious swelling noted   Neurological: She is alert and oriented to person, place, and time.   Skin: Skin is warm and dry.   Psychiatric: She has a normal mood and affect. Her behavior is normal.   Nursing note and vitals reviewed.       MDM  Number of Diagnoses or Management Options  Arthralgia of left lower leg:   Diagnosis management comments: Differential diagnosis: Leg fracture/contusion/strain   7:28 PM x-rays show no fracture or disruption of her surgical repair.  Discussed this with patient, need for follow-up with Dr. Maud Deed'Boyle later this week as scheduled       Amount and/or Complexity of Data Reviewed  Tests in the radiology section of CPT??: ordered and reviewed    Risk of Complications, Morbidity, and/or Mortality  Presenting problems: moderate  Diagnostic procedures: low  Management options: moderate    Patient Progress  Patient progress: stable      Procedures

## 2014-06-27 ENCOUNTER — Inpatient Hospital Stay: Admit: 2014-06-27 | Payer: MEDICARE | Primary: Student in an Organized Health Care Education/Training Program

## 2014-06-27 NOTE — Progress Notes (Signed)
[name]   [address] [place], [place] [address]  Phone: [phone]   Fax: [phone]    Outpatient PHYSICAL THERAPY: Daily Note 06/27/2014  Fall Risk Score: 1 (? 5 = High Risk)    ICD-10: Treatment Diagnosis: Stiffness of left ankle, not elsewhere classified (M25.672), Pain in left ankle and joints of left foot (M25.572)  REFERRING PHYSICIAN: Overton Mam, MD  MD Orders: ROM Ankle, Knee, gentle strengthening, NWB  Return Physician Appointment: 06/30/14  MEDICAL/REFERRING DIAGNOSIS: s/p L ORIF tibia/fib  DATE OF ONSET: 05/31/2014  PRIOR LEVEL OF FUNCTION: no difficulty with walking, stairs, WB activities  PRECAUTIONS/ALLERGIES: pt is on blood thinner med/allergies: codeine, penacillin, toracol, IVP contrast, shell fish, pneumonia vaccine.  ASSESSMENT:  ????????This section established at most recent assessment??????????  Ms. Mogel is a 49 year old female pt that is s/p L ORIF of tibia and fibula after falling at hospital.  She presents with decreased ROM, strength and swelling at ankle as expected and is to be NWB for at least 12 weeks post surgery. She has PMHx of Factor V and II which increases tendency towards blood clotting that will need to be monitored.  She normally is very active in walking, running, swimming and struggles with depression and Bipolor disorder, so will need help with alternate activities she can do in the mean time.  She normally helps maintain her house as well as cooking but is relying on help with this currently.  She is a good candidate for skilled physical therapy for rehabilitation towards improved independence with functional activities.    PROBLEM LIST (Impairments causing functional limitations):  1. Decreased Strength affecting function  2. Edema affecting function  3. Decreased ADL/Functional Activities  4. Decreased Flexibility/joint mobility  5. Increased Pain affecting function   GOALS: (Goals have been discussed and agreed upon with patient.)  SHORT-TERM FUNCTIONAL GOALS: Time Frame: 4 weeks  1.  L ankle DF > 5 degrees, PF > 40 degrees, inversion > 25 degrees AROM for improved mobility.  2.  Independent with entry level HEP.  3.  L hip, ankle and knee strength >/= 4/5 for return to WB activity  DISCHARGE GOALS: Time Frame: 8-12 weeks  1. L ankle ROM = R ankle ROM in all planes  2. Tolerates walking with lease assistive device 1 mile for community integration.  3. Good balance with single leg stance/weight shift activities.  4. Good strength with up/down 1 set of stairs.  5.  FAAM score >67 for improved overall function.  REHABILITATION POTENTIAL FOR STATED GOALS: GoodPLAN OF CARE:  INTERVENTIONS PLANNED: (Benefits and precautions of physical therapy have been discussed with the patient.)  1. Modalities PRN, including ultrasound, estim, and iontophoresis  2. Soft tissue and joint mobilization for ROM and flexibility  3. Stretching, progressive resistive exercises and HEP for return to functional activities.   4. Back Education and Training for body mechanics with activities of daily living  5. If needed, aquatic therapy.    TREATMENT PLAN EFFECTIVE DATES: 06/22/14 TO 09/22/14  FREQUENCY/DURATION: Follow patient 2 times a week for 12 weeks to address above goals.  Regarding Cybill Duerksen's therapy, I certify that the treatment plan above will be carried out by a therapist or under their direction.  Thank you for this referral,  [name], PT     Referring Physician Signature: Overton Mam, MD          Date  SUBJECTIVE:  Goal= return to 90 to 95% of where she was at.  Wants to be able to walk her dog.  History of Present Injury/Illness (Reason for Referral): Pt was at hospital having 14th cystoscopy April 15th and as she turned to leave nurse's station she states that her ankle "gave way."  She is now s/p L  ORIF of lateral malleolus and distal tibia on 05/31/14.  Her orders are NWB for 12 weeks.  She states her depression in worsening because of having to stay home and not be mobile.  Present Symptoms:  Last night as she was walking in bathroom after bath, 1 crutch slipped from under her arm and she fell.  She went to ER as directed by Dr. Estevan Oaks'Boyles staff.  No injury found to the plates but did tell her possible ligament pull at anterior foot. So she has more pain today then normal. Her ankle was feeling better prior to this. States her lateral and medial ankle still feels like they are "on fire" at times and her boot rubs against the tender points.    Pain Intensity 1: 6  Dominant Side: right  Past Medical History: has a past medical history of Factor V Leiden mutation (HCC); Infectious disease (02/2009); Factor II deficiency (HCC); Calculus of kidney; Depression; Iron deficiency; Migraines; Pulmonary embolism (HCC) (Multiple episodes); Bipolar disorder (HCC); Anxiety; Restless leg syndrome; and Hematuria, microscopic.  Past Surgical History:  has past surgical history that includes cystoscopy (???); lithotripsy (2006); vascular access (July, 2010); vascular access (Right, 2013); appendectomy (???); tonsillectomy; cesarean section; tubal ligation; and ovarian cyst removal. cystoscopy x 14; Green Full Filter for restraining blood clot   Current Medications: Norco, vit D3, zofran, protonix, ambien, fioricet/esgic, imitrex, cymbalta, klonopin, lovalex (blood thinner)   Date Last Reviewed: 06/27/14  Social History/Home Situation: lives with female room mate who does most of the cooking for her and helps her with house cleaning.  Work/Activity History: on disability because of blood clot disorder/previous to this injury pt liked to run, walk, swim, hike  OBJECTIVE:  Outcome Measure:   Tool Used: PT/OT FOOT AND ANKLE ABILITY MEASURE  Score:  Initial: 21/84 Most Recent: X (Date: -- )    Interpretation of Score: For the "Activities of Daily Living", there are 21 questions each scored on a 5 point scale with 0 representing "Unable to do" and 4 representing "No difficulty".  The lower the score, the greater the functional disability. 84/84 represents no disability.  Minimal detectable change is 5.7 points.  With the addition of the 8 questions in the "Sports Subscale," there are 29 questions, each scored on a 5 point scale with 0 representing "Unable to do" and 4 representing "No difficulty".  The lower the score, the greater the functional disability. 116/116 represents no disability.  Minimal detectable change is 12.3 points.    Activities of Daily Living:  Score 84 83-68 67-51 50-34 33-18 17-1 0   Modifier CH CI CJ CK CL CM CN     ? Mobility - Walking and Moving Around:    [id number] - CURRENT STATUS: CL - 60%-79% impaired, limited or restricted   [id number] - GOAL STATUS:  CI - 1%-19% impaired, limited or restricted   [id number] - D/C STATUS:  ---------------To be determined---------------    Observation/Orthostatic Postural Assessment:  Walking:  Pt walks into clinic with 2 crutches, NWB L LE.  She seems to have good control of crutches.  Pt  has 4 incisional scars: 2 at lateral  and medial ankle and 2 proximal to mid tibia.  No weeping of wound or incisions noted.  Steri strips removed at distal medial/lateral incisions. Moderate swelling at ankle  Palpation:  Stiff subtalar joint  ROM:               LLE AROM  L Ankle Dorsiflexion: 3  L Ankle Plantar Flexion: 30  L Ankle Eversion: 8  L Ankle Inversion: 12           Strength:  At eval:  L hip abd = 4/5                 Special Tests:  Mild pain at mid calf with Hoffman's squeeze.  Functional Mobility: independent with compensation  Balance:  Orders are NWB L.  Pt negotiates walking with crutches well.  TREATMENT:    (In addition to Assessment/Re-Assessment sessions the following treatments were rendered)   Therapeutic Exercise: (15 Minutes): helped pt don TED stocking;   With L LE on wedge: ankle DF/PF 20x, inversion/eversion AROM 20x, with foot placed on wedge and slightly against wall toe fanning 20x, foot doming 20x;  Sitting LAQ 20x  Manual Therapy (Left Joint Mobilization Duration  Duration: 10 Minutes   Soft Tissue Mobilization Duration  Duration: 15 Minutes) myofascial release around all incisions with gentle scar mobilization; gentle ROM for ankle DF/PF, inversion/eversion; subtalar and midfoot joint mobilization.  Therapeutic Modalities:                             Left Ankle Cold  Type: Cold/ice pack (vasopneumatic mild pressure, 42 degrees)  Duration : 15 minutes                                                                 HEP: HEP= ankle AROM, toe AROM, SLR and hip abd, knee flex/extend-  All 10x;  BIL hip flexion prolonged isometric hold, BIL hip ext prolonged isometric hold, and BIL hip flexion with hands crossed prolonged isometric  Hold, added LAQ  ______________________________________________________________________________________________________    Treatment Assessment:  Pt seemed to tolerate TED stocking OK but states that she is not sure about tolerating it long term.  Ankle ROM gradual improvement.  Progression/Medical Necessity:   ?? Patient is expected to demonstrate progress in strength, range of motion and balance to increase independence with walking, WB activities..  Compliance with Program/Exercises: Will assess as treatment progresses.   Reason for Continuation of Services/Other Comments:  ?? Patient continues to require skilled intervention due to needs skilled guidance for HEP, manual for ROM.Marland Kitchen.  Recommendations/Intent for next treatment session: "Treatment next visit will focus on continue with ROM, gentle strengthening. ".    Total Treatment Duration:55 min  PT Patient Time In/Time Out  Time In: 1320  Time Out: 1420    Gwendolyn LimaLisa B Deziree Mokry, PT MSPT

## 2014-06-28 NOTE — Progress Notes (Signed)
MSSP outreach to Krista Lopez, no answer, left a voice message for her to call me to discuss fall and precautions to keep that from happening again.

## 2014-06-29 NOTE — Progress Notes (Addendum)
[facility] [name]   [name], [address] [place], [place] [id number]  Phone: [phone]   Fax: [phone]    Outpatient PHYSICAL THERAPY: MD Report 06/27/2014  Fall Risk Score: 1 (? 5 = High Risk)    ICD-10: Treatment Diagnosis: Stiffness of left ankle, not elsewhere classified (M25.672), Pain in left ankle and joints of left foot (M25.572)  REFERRING PHYSICIAN: Overton Mamboyle, Michael J, MD  MD Orders: ROM Ankle, Knee, gentle strengthening, NWB  ASSESSMENT:  ????????This section established at most recent assessment??????????  Krista Lopez is a 49 year old female pt that is s/p L ORIF of tibia and fibula after falling at hospital.  Pt has been seen for 2 visits and has made mild progress with ankle ROM.  Pain levels improved after starting PT.  She is having difficulty donning TED stocking and tolerating it and have tried to work with her on this.  Hoping she was more successful in keeping it on after our last session.  She seems very compliant with directions and being NWB.  She had recent fall secondary to crutch slipping out from underneath her and ER visit did not reveal any injury to surgical site.    SUBJECTIVE  Present Symptoms:  Last night as she was walking in bathroom after bath, 1 crutch slipped from under her arm and she fell.  She went to ER as directed by Dr. Estevan Oaks'Boyles staff.  No injury found to the plates but did tell her possible ligament pull at anterior foot. So she has more pain today then normal. Her ankle was feeling better prior to this. States her lateral and medial ankle still feels like they are "on fire" at times and her boot rubs against the tender points.    Pain Intensity 1: 6  OBJECTIVE:  Observation/Orthostatic Postural Assessment:  Walking:  Pt walks into clinic with 2 crutches, NWB L LE.  She seems to have good control of crutches.  Pt  has 4 incisional scars: 2 at lateral and medial ankle and 2 proximal  to mid tibia.  No weeping of wound or incisions noted.  Steri strips removed at distal medial/lateral incisions. Moderate swelling at ankle  Palpation:  Stiff subtalar joint  ROM:               LLE AROM  L Ankle Dorsiflexion: 3  L Ankle Plantar Flexion: 30  L Ankle Eversion: 8  L Ankle Inversion: 12           Strength:  At eval:  L hip abd = 4/5                 Special Tests:  Mild pain at mid calf with Hoffman's squeeze.  Functional Mobility: independent with compensation  Balance:  Orders are NWB L.  Pt negotiates walking with crutches well.  TREATMENT:   Treatment Assessment:  Pt seemed to tolerate TED stocking OK but states that she is not sure about tolerating it long term.  Ankle ROM gradual improvement.  PLAN OF CARE:  Continue with plan of care.    Gwendolyn LimaLisa B Marvelous Bouwens, PT MSPT

## 2014-06-30 ENCOUNTER — Inpatient Hospital Stay: Admit: 2014-06-30 | Payer: MEDICARE | Primary: Student in an Organized Health Care Education/Training Program

## 2014-06-30 NOTE — Progress Notes (Addendum)
[facility]rapy Center at [facility]   [address] [place], [place] [address]  Phone: [phone]   Fax: [phone]    Outpatient PHYSICAL THERAPY: Daily Note 06/30/2014  Fall Risk Score: 1 (? 5 = High Risk)    ICD-10: Treatment Diagnosis: Stiffness of left ankle, not elsewhere classified (M25.672), Pain in left ankle and joints of left foot (M25.572)  REFERRING PHYSICIAN: Overton Mam, MD  MD Orders: ROM Ankle, Knee, gentle strengthening, NWB  Return Physician Appointment: 06/30/14  MEDICAL/REFERRING DIAGNOSIS: s/p L ORIF tibia/fib  DATE OF ONSET: 05/31/2014  PRIOR LEVEL OF FUNCTION: no difficulty with walking, stairs, WB activities  PRECAUTIONS/ALLERGIES: pt is on blood thinner med/allergies: codeine, penacillin, toracol, IVP contrast, shell fish, pneumonia vaccine.  ASSESSMENT:  ????????This section established at most recent assessment??????????  Krista Lopez is a 49 year old female pt that is s/p L ORIF of tibia and fibula after falling at hospital.  She presents with decreased ROM, strength and swelling at ankle as expected and is to be NWB for at least 12 weeks post surgery. She has PMHx of Factor V and II which increases tendency towards blood clotting that will need to be monitored.  She normally is very active in walking, running, swimming and struggles with depression and Bipolor disorder, so will need help with alternate activities she can do in the mean time.  She normally helps maintain her house as well as cooking but is relying on help with this currently.  She is a good candidate for skilled physical therapy for rehabilitation towards improved independence with functional activities.    PROBLEM LIST (Impairments causing functional limitations):  1. Decreased Strength affecting function  2. Edema affecting function  3. Decreased ADL/Functional Activities  4. Decreased Flexibility/joint mobility  5. Increased Pain affecting function   GOALS: (Goals have been discussed and agreed upon with patient.)  SHORT-TERM FUNCTIONAL GOALS: Time Frame: 4 weeks  1.  L ankle DF > 5 degrees, PF > 40 degrees, inversion > 25 degrees AROM for improved mobility.  2.  Independent with entry level HEP.  3.  L hip, ankle and knee strength >/= 4/5 for return to WB activity  DISCHARGE GOALS: Time Frame: 8-12 weeks  1. L ankle ROM = R ankle ROM in all planes  2. Tolerates walking with lease assistive device 1 mile for community integration.  3. Good balance with single leg stance/weight shift activities.  4. Good strength with up/down 1 set of stairs.  5.  FAAM score >67 for improved overall function.  REHABILITATION POTENTIAL FOR STATED GOALS: GoodPLAN OF CARE:  INTERVENTIONS PLANNED: (Benefits and precautions of physical therapy have been discussed with the patient.)  1. Modalities PRN, including ultrasound, estim, and iontophoresis  2. Soft tissue and joint mobilization for ROM and flexibility  3. Stretching, progressive resistive exercises and HEP for return to functional activities.   4. Back Education and Training for body mechanics with activities of daily living  5. If needed, aquatic therapy.    TREATMENT PLAN EFFECTIVE DATES: 06/22/14 TO 09/22/14  FREQUENCY/DURATION: Follow patient 2 times a week for 12 weeks to address above goals.  Regarding Krista Lopez's therapy, I certify that the treatment plan above will be carried out by a therapist or under their direction.  Thank you for this referral,  [name], PT     Referring Physician Signature: Overton Mam, MD          Date  SUBJECTIVE:  Goal= return to 90 to 95% of where she was at.  Wants to be able to walk her dog.  History of Present Injury/Illness (Reason for Referral): Pt was at hospital having 14th cystoscopy April 15th and as she turned to leave nurse's station she states that her ankle "gave way."  She is now s/p L  ORIF of lateral malleolus and distal tibia on 05/31/14.  Her orders are NWB for 12 weeks.  She states her depression in worsening because of having to stay home and not be mobile.  Present Symptoms:  Had visit with Dr. Maud Deed and he was pleased with her pulse at anterior ankle, ROM.  He told her they will not know for awhile if there was injury to her plates from her fall and this discouraged her.  Is tolerating TED hose better.     Dominant Side: right  Past Medical History: has a past medical history of Factor V Leiden mutation (HCC); Infectious disease (02/2009); Factor II deficiency (HCC); Calculus of kidney; Depression; Iron deficiency; Migraines; Pulmonary embolism (HCC) (Multiple episodes); Bipolar disorder (HCC); Anxiety; Restless leg syndrome; and Hematuria, microscopic.  Past Surgical History:  has past surgical history that includes cystoscopy (???); lithotripsy (2006); vascular access (July, 2010); vascular access (Right, 2013); appendectomy (???); tonsillectomy; cesarean section; tubal ligation; and ovarian cyst removal. cystoscopy x 14; Green Full Filter for restraining blood clot   Current Medications: Norco, vit D3, zofran, protonix, ambien, fioricet/esgic, imitrex, cymbalta, klonopin, lovalex (blood thinner)   Date Last Reviewed: 06/27/14  Social History/Home Situation: lives with female room mate who does most of the cooking for her and helps her with house cleaning.  Work/Activity History: on disability because of blood clot disorder/previous to this injury pt liked to run, walk, swim, hike  OBJECTIVE:  Outcome Measure:   Tool Used: PT/OT FOOT AND ANKLE ABILITY MEASURE  Score:  Initial: 21/84 Most Recent: X (Date: -- )   Interpretation of Score: For the "Activities of Daily Living", there are 21 questions each scored on a 5 point scale with 0 representing "Unable to do" and 4 representing "No difficulty".  The lower the score, the greater  the functional disability. 84/84 represents no disability.  Minimal detectable change is 5.7 points.  With the addition of the 8 questions in the "Sports Subscale," there are 29 questions, each scored on a 5 point scale with 0 representing "Unable to do" and 4 representing "No difficulty".  The lower the score, the greater the functional disability. 116/116 represents no disability.  Minimal detectable change is 12.3 points.    Activities of Daily Living:  Score 84 83-68 67-51 50-34 33-18 17-1 0   Modifier CH CI CJ CK CL CM CN     ? Mobility - Walking and Moving Around:    U0454 - CURRENT STATUS: CL - 60%-79% impaired, limited or restricted   U9811 - GOAL STATUS:  CI - 1%-19% impaired, limited or restricted   B1478 - D/C STATUS:  ---------------To be determined---------------    Observation/Orthostatic Postural Assessment:  Walking:  Pt walks into clinic with 2 crutches, NWB L LE.  She seems to have good control of crutches.  Palpation:  Mild stiffness subtalar joint  ROM: not assessed today                          Strength:  At eval:  L hip abd = 4/5  Special Tests:  Mild pain at mid calf with Hoffman's squeeze.  Functional Mobility: independent with compensation  Balance:  Orders are NWB L.  Pt negotiates walking with crutches well.  TREATMENT:    (In addition to Assessment/Re-Assessment sessions the following treatments were rendered)  Therapeutic Exercise: (20 Minutes): with foot on wedge:  sustained holds against theraband of ankle DF/PF,inversion/eversion 10 sec x 10 ea way, ankle circles; gastroc stretch 30 sec x 3; Also verbally reviewed following-- With foot placed on wedge and slightly against wall toe flexion/extension, foot doming ; Sitting LAQ, SLR front/side.  Manual Therapy (    Soft Tissue Mobilization Duration  Duration: 23 Minutes) myofascial release around lower medial ankle incision with gentle scar mobilization; gentle ROM for ankle DF/PF,  inversion/eversion; subtalar and midfoot joint mobilization grade 2-3; talas posterior glide grade 2  Therapeutic Modalities: not today                                                                                              HEP: HEP updated and copy given to pt, copy in file.  ______________________________________________________________________________________________________    Treatment Assessment:  Good return demonstration of exercises. Pt reported medial ankle pain improved.  Instructed in icing to ankle for 10-15 min every few hours if this is helpful.  Progression/Medical Necessity:   ?? Patient is expected to demonstrate progress in strength, range of motion and balance to increase independence with walking, WB activities..  Compliance with Program/Exercises:  Appears compliant and pt states she is compliant.  Reason for Continuation of Services/Other Comments:  ?? Patient continues to require skilled intervention due to needs skilled guidance for HEP, manual for ROM.Marland Kitchen.  Recommendations/Intent for next treatment session: "Treatment next visit will focus on continue with ROM, gentle strengthening for ankle, quad, core- continue with progression of SLR program.    Total Treatment Duration: 43 min  PT Patient Time In/Time Out  Time In: 0900  Time Out: 0949    Gwendolyn LimaLisa B Jhane Lorio, PT MSPT

## 2014-07-03 ENCOUNTER — Inpatient Hospital Stay: Admit: 2014-07-03 | Payer: MEDICARE | Primary: Student in an Organized Health Care Education/Training Program

## 2014-07-03 NOTE — Progress Notes (Signed)
[facility]   [address], [address] [place], [place] [address]  Phone: [phone]   Fax: [phone]    Outpatient PHYSICAL THERAPY: Daily Note 07/03/2014  Fall Risk Score: 1 (? 5 = High Risk)    ICD-10: Treatment Diagnosis: Stiffness of left ankle, not elsewhere classified (M25.672), Pain in left ankle and joints of left foot (M25.572)  REFERRING PHYSICIAN: Overton Mam, MD  MD Orders: ROM Ankle, Knee, gentle strengthening, NWB  Return Physician Appointment: 06/30/14  MEDICAL/REFERRING DIAGNOSIS: s/p L ORIF tibia/fib  DATE OF ONSET: 05/31/2014  PRIOR LEVEL OF FUNCTION: no difficulty with walking, stairs, WB activities  PRECAUTIONS/ALLERGIES: pt is on blood thinner med/allergies: codeine, penacillin, toracol, IVP contrast, shell fish, pneumonia vaccine.  ASSESSMENT:  ????????This section established at most recent assessment??????????  Krista Lopez is a 49 year old female pt that is s/p L ORIF of tibia and fibula after falling at hospital.  She presents with decreased ROM, strength and swelling at ankle as expected and is to be NWB for at least 12 weeks post surgery. She has PMHx of Factor V and II which increases tendency towards blood clotting that will need to be monitored.  She normally is very active in walking, running, swimming and struggles with depression and Bipolor disorder, so will need help with alternate activities she can do in the mean time.  She normally helps maintain her house as well as cooking but is relying on help with this currently.  She is a good candidate for skilled physical therapy for rehabilitation towards improved independence with functional activities.    PROBLEM LIST (Impairments causing functional limitations):  1. Decreased Strength affecting function  2. Edema affecting function  3. Decreased ADL/Functional Activities  4. Decreased Flexibility/joint mobility  5. Increased Pain affecting function   GOALS: (Goals have been discussed and agreed upon with patient.)  SHORT-TERM FUNCTIONAL GOALS: Time Frame: 4 weeks  1.  L ankle DF > 5 degrees, PF > 40 degrees, inversion > 25 degrees AROM for improved mobility.  2.  Independent with entry level HEP.  3.  L hip, ankle and knee strength >/= 4/5 for return to WB activity  DISCHARGE GOALS: Time Frame: 8-12 weeks  1. L ankle ROM = R ankle ROM in all planes  2. Tolerates walking with lease assistive device 1 mile for community integration.  3. Good balance with single leg stance/weight shift activities.  4. Good strength with up/down 1 set of stairs.  5.  FAAM score >67 for improved overall function.  REHABILITATION POTENTIAL FOR STATED GOALS: GoodPLAN OF CARE:  INTERVENTIONS PLANNED: (Benefits and precautions of physical therapy have been discussed with the patient.)  1. Modalities PRN, including ultrasound, estim, and iontophoresis  2. Soft tissue and joint mobilization for ROM and flexibility  3. Stretching, progressive resistive exercises and HEP for return to functional activities.   4. Back Education and Training for body mechanics with activities of daily living  5. If needed, aquatic therapy.    TREATMENT PLAN EFFECTIVE DATES: 06/22/14 TO 09/22/14  FREQUENCY/DURATION: Follow patient 2 times a week for 12 weeks to address above goals.                SUBJECTIVE:  Goal= return to 90 to 95% of where she was at.  Wants to be able to walk her dog.  History of Present Injury/Illness (Reason for Referral): Pt was at hospital having 14th cystoscopy April 15th and as she turned to leave  nurse's station she states that her ankle "gave way."  She is now s/p L ORIF of lateral malleolus and distal tibia on 05/31/14.  Her orders are NWB for 12 weeks.  She states her depression in worsening because of having to stay home and not be mobile.  Present Symptoms:  More pain on the inside of the ankle. Pain 6/10.   Dominant Side: right   Past Medical History: has a past medical history of Factor V Leiden mutation (HCC); Infectious disease (02/2009); Factor II deficiency (HCC); Calculus of kidney; Depression; Iron deficiency; Migraines; Pulmonary embolism (HCC) (Multiple episodes); Bipolar disorder (HCC); Anxiety; Restless leg syndrome; and Hematuria, microscopic.  Past Surgical History:  has past surgical history that includes cystoscopy (???); lithotripsy (2006); vascular access (July, 2010); vascular access (Right, 2013); appendectomy (???); tonsillectomy; cesarean section; tubal ligation; and ovarian cyst removal. cystoscopy x 14; Green Full Filter for restraining blood clot   Current Medications: Norco, vit D3, zofran, protonix, ambien, fioricet/esgic, imitrex, cymbalta, klonopin, lovalex (blood thinner) Date Last Reviewed: 06/27/14  Social History/Home Situation: lives with female room mate who does most of the cooking for her and helps her with house cleaning.  Work/Activity History: on disability because of blood clot disorder/previous to this injury pt liked to run, walk, swim, hike  OBJECTIVE:  Outcome Measure:   Tool Used: PT/OT FOOT AND ANKLE ABILITY MEASURE  Score:  Initial: 21/84 Most Recent: X (Date: -- )   Interpretation of Score: For the "Activities of Daily Living", there are 21 questions each scored on a 5 point scale with 0 representing "Unable to do" and 4 representing "No difficulty".  The lower the score, the greater the functional disability. 84/84 represents no disability.  Minimal detectable change is 5.7 points.  With the addition of the 8 questions in the "Sports Subscale," there are 29 questions, each scored on a 5 point scale with 0 representing "Unable to do" and 4 representing "No difficulty".  The lower the score, the greater the functional disability. 116/116 represents no disability.  Minimal detectable change is 12.3 points.    Activities of Daily Living:  Score 84 83-68 67-51 50-34 33-18 17-1 0    Modifier CH CI CJ CK CL CM CN     ? Mobility - Walking and Moving Around:    Z6109 - CURRENT STATUS: CL - 60%-79% impaired, limited or restricted   U0454 - GOAL STATUS:  CI - 1%-19% impaired, limited or restricted   U9811 - D/C STATUS:  ---------------To be determined---------------    Observation/Orthostatic Postural Assessment:  Walking:  Pt walks into clinic with 2 crutches, NWB L LE.    Palpation: tender to medial ankle incision   ROM: not assessed today                          Strength:  At eval:  L hip abd = 4/5                 Special Tests:  Mild pain at mid calf with Hoffman's squeeze.  Functional Mobility: independent with compensation  Balance:  Orders are NWB L.  Pt negotiates walking with crutches well.  TREATMENT:    (In addition to Assessment/Re-Assessment sessions the following treatments were rendered)  Therapeutic Exercise: (15 Minutes): for LE and ankle strengthening. Ankle 4-way with yellow t-band 2x10 ea way. SLR 2x5. Sitting long arc quad 1# 2x10.   Manual Therapy (    Soft Tissue Mobilization  Duration  Duration: 25 Minutes) for ankle ROM and improved tissue mobilization. myofascial release around lower medial ankle incision with gentle scar mobilization; gentle ROM for ankle DF/PF, inversion/eversion; subtalar and midfoot joint mobilization grade 2-3; talas posterior glide grade 2  Therapeutic Modalities: not today                                                                                              HEP: continue current HEP  ______________________________________________________________________________________________________    Treatment Assessment: She had difficulty with SLR exercise today. No complaints of pain with ankle ROM.   Progression/Medical Necessity:   ?? Patient is expected to demonstrate progress in strength, range of motion and balance to increase independence with walking, WB activities..  Compliance with Program/Exercises:  Appears compliant and pt states she is  compliant.  Reason for Continuation of Services/Other Comments:  ?? Patient continues to require skilled intervention due to needs skilled guidance for HEP, manual for ROM.Marland Kitchen.  Recommendations/Intent for next treatment session: "Treatment next visit will focus on continue with ROM, gentle strengthening for ankle, quad, core- continue with progression of SLR program.    Total Treatment Duration: 40 min  PT Patient Time In/Time Out  Time In: 1100  Time Out: 1140    Lundy Cozart Ekman-Suttles, PT

## 2014-07-06 ENCOUNTER — Encounter: Payer: MEDICARE | Primary: Student in an Organized Health Care Education/Training Program

## 2014-07-06 ENCOUNTER — Emergency Department: Payer: MEDICARE | Primary: Student in an Organized Health Care Education/Training Program

## 2014-07-06 ENCOUNTER — Inpatient Hospital Stay
Admit: 2014-07-06 | Discharge: 2014-07-07 | Disposition: A | Discharge status: Home / Self Care | Payer: MEDICARE | Attending: Emergency Medicine

## 2014-07-06 DIAGNOSIS — R0781 Pleurodynia: Secondary | ICD-10-CM

## 2014-07-06 LAB — CBC WITH AUTOMATED DIFF
ABS. BASOPHILS: 0 10*3/uL (ref 0.0–0.2)
ABS. EOSINOPHILS: 0.7 10*3/uL (ref 0.0–0.8)
ABS. IMM. GRANS.: 0 10*3/uL (ref 0.0–0.5)
ABS. LYMPHOCYTES: 1.4 10*3/uL (ref 0.5–4.6)
ABS. MONOCYTES: 0.3 10*3/uL (ref 0.1–1.3)
ABS. NEUTROPHILS: 3 10*3/uL (ref 1.7–8.2)
BASOPHILS: 0 % (ref 0.0–2.0)
EOSINOPHILS: 13 % — ABNORMAL HIGH (ref 0.5–7.8)
HCT: 35.5 % — ABNORMAL LOW (ref 35.8–46.3)
HGB: 11.1 g/dL — ABNORMAL LOW (ref 11.7–15.4)
IMMATURE GRANULOCYTES: 0 % (ref 0.0–5.0)
LYMPHOCYTES: 26 % (ref 13–44)
MCH: 26.6 PG (ref 26.1–32.9)
MCHC: 31.3 g/dL — ABNORMAL LOW (ref 31.4–35.0)
MCV: 84.9 FL (ref 79.6–97.8)
MONOCYTES: 6 % (ref 4.0–12.0)
MPV: 11.1 FL (ref 10.8–14.1)
NEUTROPHILS: 55 % (ref 43–78)
PLATELET: 231 10*3/uL (ref 150–450)
RBC: 4.18 M/uL (ref 4.05–5.25)
RDW: 15.4 % — ABNORMAL HIGH (ref 11.9–14.6)
WBC: 5.5 10*3/uL (ref 4.3–11.1)

## 2014-07-06 LAB — EKG, 12 LEAD, INITIAL
Atrial Rate: 94 {beats}/min
Calculated P Axis: 64 degrees
Calculated R Axis: 92 degrees
Calculated T Axis: 51 degrees
Diagnosis: NORMAL
P-R Interval: 164 ms
Q-T Interval: 352 ms
QRS Duration: 80 ms
QTC Calculation (Bezet): 440 ms
Ventricular Rate: 94 {beats}/min

## 2014-07-06 LAB — METABOLIC PANEL, COMPREHENSIVE
A-G Ratio: 1.4 (ref 1.2–3.5)
ALT (SGPT): 17 U/L (ref 12–65)
AST (SGOT): 13 U/L — ABNORMAL LOW (ref 15–37)
Albumin: 3.9 g/dL (ref 3.5–5.0)
Alk. phosphatase: 112 U/L (ref 50–136)
Anion gap: 9 mmol/L (ref 7–16)
BUN: 14 MG/DL (ref 6–23)
Bilirubin, total: 0.2 MG/DL (ref 0.2–1.1)
CO2: 26 mmol/L (ref 21–32)
Calcium: 9 MG/DL (ref 8.3–10.4)
Chloride: 107 mmol/L (ref 98–107)
Creatinine: 1.09 MG/DL — ABNORMAL HIGH (ref 0.6–1.0)
GFR est AA: >60 mL/min/{1.73_m2} (ref >60)
GFR est non-AA: 57 mL/min/{1.73_m2} — ABNORMAL LOW (ref >60)
Globulin: 2.7 g/dL (ref 2.3–3.5)
Glucose: 114 mg/dL — ABNORMAL HIGH (ref 65–100)
Potassium: 3.7 mmol/L (ref 3.5–5.1)
Protein, total: 6.6 g/dL (ref 6.3–8.2)
Sodium: 142 mmol/L (ref 136–145)

## 2014-07-06 LAB — POC TROPONIN: Troponin-I (POC): 0 ng/ml (ref 0.0–0.08)

## 2014-07-06 MED ORDER — IOPAMIDOL 76 % IV SOLN
370 mg iodine /mL (76 %) | Freq: Once | INTRAVENOUS | Status: AC
Start: 2014-07-06 — End: 2014-07-06
  Administered 2014-07-06: via INTRAVENOUS

## 2014-07-06 MED ORDER — METHYLPREDNISOLONE (PF) 125 MG/2 ML IJ SOLR
125 mg/2 mL | Freq: Once | INTRAMUSCULAR | Status: AC
Start: 2014-07-06 — End: 2014-07-06
  Administered 2014-07-06: 23:00:00 via INTRAVENOUS

## 2014-07-06 MED ORDER — SODIUM CHLORIDE 0.9 % IJ SYRG
INTRAMUSCULAR | Status: DC | PRN
Start: 2014-07-06 — End: 2014-07-07

## 2014-07-06 MED ORDER — SODIUM CHLORIDE 0.9% BOLUS IV
0.9 % | Freq: Once | INTRAVENOUS | Status: AC
Start: 2014-07-06 — End: 2014-07-06
  Administered 2014-07-06: via INTRAVENOUS

## 2014-07-06 MED ORDER — ONDANSETRON (PF) 4 MG/2 ML INJECTION
4 mg/2 mL | INTRAMUSCULAR | Status: AC
Start: 2014-07-06 — End: 2014-07-06
  Administered 2014-07-06: 23:00:00 via INTRAVENOUS

## 2014-07-06 MED ORDER — SODIUM CHLORIDE 0.9 % IJ SYRG
Freq: Three times a day (TID) | INTRAMUSCULAR | Status: DC
Start: 2014-07-06 — End: 2014-07-07

## 2014-07-06 MED ORDER — DIPHENHYDRAMINE HCL 50 MG/ML IJ SOLN
50 mg/mL | INTRAMUSCULAR | Status: AC
Start: 2014-07-06 — End: 2014-07-06
  Administered 2014-07-06: 23:00:00 via INTRAVENOUS

## 2014-07-06 MED ORDER — MORPHINE 4 MG/ML SYRINGE
4 mg/mL | INTRAMUSCULAR | Status: AC
Start: 2014-07-06 — End: 2014-07-06
  Administered 2014-07-06: 23:00:00 via INTRAVENOUS

## 2014-07-06 MED ORDER — SALINE PERIPHERAL FLUSH PRN
Freq: Once | INTRAMUSCULAR | Status: AC
Start: 2014-07-06 — End: 2014-07-06
  Administered 2014-07-06

## 2014-07-06 MED FILL — ONDANSETRON (PF) 4 MG/2 ML INJECTION: 4 mg/2 mL | INTRAMUSCULAR | Qty: 2

## 2014-07-06 MED FILL — MORPHINE 4 MG/ML SYRINGE: 4 mg/mL | INTRAMUSCULAR | Qty: 1

## 2014-07-06 MED FILL — DIPHENHYDRAMINE HCL 50 MG/ML IJ SOLN: 50 mg/mL | INTRAMUSCULAR | Qty: 1

## 2014-07-06 MED FILL — SOLU-MEDROL (PF) 125 MG/2 ML SOLUTION FOR INJECTION: 125 mg/2 mL | INTRAMUSCULAR | Qty: 2

## 2014-07-06 NOTE — ED Notes (Signed)
Patient port aspirated and flushed with good return,  Ct notified

## 2014-07-06 NOTE — ED Notes (Signed)
Back to CT via stretcher.

## 2014-07-06 NOTE — ED Notes (Signed)
Itching with morphine.  Benadryl given for itching.   solumedol given for plans of CT.

## 2014-07-06 NOTE — Progress Notes (Signed)
Spoke with Dr. Teofilo PodKaczmarek about pt port. Advised to return pt to ED. Spoke to MarriottMissy RN and informed her to let me know when or if pt would be ready to return to CT for study.

## 2014-07-06 NOTE — ED Notes (Signed)
Patient states she started having chest pain and SOB-started 1030.

## 2014-07-06 NOTE — ED Notes (Signed)
To CT via stretcher.

## 2014-07-06 NOTE — Progress Notes (Signed)
Pt port not flushing well enough to hold up to injection rate for study. Per pt will be unable to get an IV started. Taken back to ER to see if RN can get port to flush better.

## 2014-07-06 NOTE — ED Notes (Signed)
Discharge instructions covered verbally.  Pt verbalized understanding.   [facility] called for transport home.

## 2014-07-06 NOTE — ED Provider Notes (Signed)
HPI Comments: 49 year old white female history of previous PEs treated with Coumadin until March 17 when she underwent surgery on her left ankle.  Treated with Lovenox in the hospital and upon discharge until approximately 4 days ago when the Lovenox was discontinued.  She has not restarted Coumadin.  This morning she developed a sharp chest discomfort that is aggravated by breathing.  She does report nausea but no vomiting.     Patient is a 49 y.o. female presenting with chest pain. [name]   Chest Pain (Angina)   Associated symptoms include nausea. Pertinent negatives include no abdominal pain, no back pain, no cough, no fever, no headaches and no vomiting.        Past Medical History:   Diagnosis Date   ??? Factor V Leiden mutation (HCC)    ??? Infectious disease 02/2009      Hx MRSA / E.Coli bacteremia, MRSA wound infection 2/2 port   ??? Factor II deficiency (HCC)    ??? Calculus of kidney    ??? Depression    ??? Iron deficiency    ??? Migraines    ??? 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 Bipolar disorder (HCC)    ??? Anxiety    ??? Restless leg syndrome    ??? Hematuria, microscopic        Past Surgical History:   Procedure Laterality Date   ??? Cystoscopy  ???     x 13   ??? Lithotripsy  2006   ??? Hx vascular access  July, 2010     [place]   ??? Hx vascular access Right 2013     pt has [facility]   ??? Hx appendectomy  ???   ??? Hx tonsillectomy     ??? Hx cesarean section     ??? Hx tubal ligation     ??? Hx ovarian cyst removal       on R   ??? Hx orthopaedic       left LE         Family History:   Problem Relation Age of Onset   ??? Bleeding Prob Father      Factor V   ??? Cancer Maternal Grandmother      metastatic breast CA.   ??? Ovarian Cancer Mother [id number]     passed away due to ca   ??? Bipolar Disorder Mother    ??? Kidney Disease Brother      kidney stones       History      Social History   ??? Marital Status: DIVORCED     Spouse Name: N/A   ??? Number of Children: N/A   ??? Years of Education: N/A     Occupational History   ??? Not on file.     Social History Main Topics   ??? Smoking status: Never Smoker    ??? Smokeless tobacco: Never Used   ??? Alcohol Use: Yes      Comment: rarely - only drinks 2 times per year   ??? Drug Use: No      Comment: narcotic seeking per hospital hx.   ??? Sexual Activity:     Partners: Male     Birth Control/ Protection: Surgical     Other Topics Concern   ??? Not on file  Social History Narrative           ALLERGIES: Ativan; Codeine; Demerol; Hydrocodone-ibuprofen; Iodinated contrast media - iv dye; Ketorolac tromethamine; Motrin; Pcn; Pneumococcal vaccine; Pneumovax 23; and Shellfish containing products      Review of Systems   Constitutional: Negative for fever.   HENT: Negative for congestion.    Respiratory: Negative for cough.    Cardiovascular: Positive for chest pain. Negative for leg swelling.   Gastrointestinal: Positive for nausea. Negative for vomiting and abdominal pain.   Genitourinary: Negative for dysuria.   Musculoskeletal: Negative for back pain and neck pain.   Skin: Negative for rash.   Neurological: Negative for headaches.       Filed Vitals:    07/06/14 1945 07/06/14 2015 07/06/14 2146 07/06/14 2156   BP: 119/56 113/57 129/73    Pulse: 80  74 67   Temp:       Resp: Height:       Weight:       SpO2: 99% 97% 96% 93%            Physical Exam   Constitutional: She appears well-developed and well-nourished. No distress.   HENT:   Head: Normocephalic and atraumatic.   Mouth/Throat: Oropharynx is clear and moist.   Eyes: Conjunctivae are normal. Pupils are equal, round, and reactive to light.   Neck: Normal range of motion. Neck supple.   Cardiovascular: Normal rate and regular rhythm.    No murmur heard.  Pulmonary/Chest: Effort normal and breath sounds normal.   Abdominal: Soft. There is no tenderness.   Musculoskeletal:    Left ankle in a postop boot.  No calf pain or cords.   Neurological: She is alert.   Skin: Skin is warm and dry.   Psychiatric: She has a normal mood and affect.   Nursing note and vitals reviewed.       MDM  Number of Diagnoses or Management Options  Pleuritic chest pain:   Diagnosis management comments: Lab work is unremarkable.  EKG is normal.  Troponin is 0. Tried multiple times to perform CT scan using her port however it would not tolerate the contrast.  She subsequently had an IV placed under ultrasound guidance in the [facility].   When CT was attempted, the IV hub became detached in the contrast did not infuse.  Patient is not able to have VQ scan done because she states that she has defects from previous PEs and therefore the study are not accurate.  As the patient was only taken off of Coumadin for the surgical procedure feel that it is indicated to go ahead and restart her Coumadin therapy.  We will bridge with Lovenox. ppatient states she has plenty of Lovenox at home.  We will have her resume her areas does  twice a day for 5 days.  She is to follow-up with her hematologist on Wednesday.       Amount and/or Complexity of Data Reviewed  Clinical lab tests: ordered and reviewed  Tests in the medicine section of CPT??: ordered and reviewed    Risk of Complications, Morbidity, and/or Mortality  Presenting problems: moderate  Diagnostic procedures: moderate  Management options: moderate        Procedures

## 2014-07-06 NOTE — ED Notes (Signed)
CT unable to complete order of CT chest R/T port.  MD notified.

## 2014-07-07 ENCOUNTER — Encounter: Payer: MEDICARE | Primary: Student in an Organized Health Care Education/Training Program

## 2014-07-07 MED ORDER — MORPHINE 4 MG/ML SYRINGE
4 mg/mL | Freq: Once | INTRAMUSCULAR | Status: AC
Start: 2014-07-07 — End: 2014-07-06
  Administered 2014-07-07: 01:00:00 via INTRAVENOUS

## 2014-07-07 MED ORDER — SODIUM CHLORIDE 0.9% BOLUS IV
0.9 % | Freq: Once | INTRAVENOUS | Status: AC
Start: 2014-07-07 — End: 2014-07-06
  Administered 2014-07-07: 02:00:00 via INTRAVENOUS

## 2014-07-07 MED ORDER — WARFARIN 5 MG TAB
5 mg | ORAL_TABLET | Freq: Every day | ORAL | Status: DC
Start: 2014-07-07 — End: 2014-07-17

## 2014-07-07 MED ORDER — HEPARIN, PORCINE (PF) 100 UNIT/ML IV SYRINGE
100 unit/mL | INTRAVENOUS | Status: DC | PRN
Start: 2014-07-07 — End: 2014-07-07

## 2014-07-07 MED ORDER — MORPHINE 4 MG/ML SYRINGE
4 mg/mL | Freq: Once | INTRAMUSCULAR | Status: AC
Start: 2014-07-07 — End: 2014-07-06
  Administered 2014-07-07: 02:00:00 via INTRAVENOUS

## 2014-07-07 MED FILL — MORPHINE 4 MG/ML SYRINGE: 4 mg/mL | INTRAMUSCULAR | Qty: 1

## 2014-07-07 MED FILL — MONOJECT PREFILL ADVANCED (PF) 100 UNIT/ML INTRAVENOUS SYRINGE: 100 unit/mL | INTRAVENOUS | Qty: 3

## 2014-07-12 ENCOUNTER — Inpatient Hospital Stay: Admit: 2014-07-12 | Payer: MEDICARE | Primary: Student in an Organized Health Care Education/Training Program

## 2014-07-12 DIAGNOSIS — M25572 Pain in left ankle and joints of left foot: Secondary | ICD-10-CM

## 2014-07-12 NOTE — Progress Notes (Signed)
[name]   [name], [address] [place], [place] [address]  Phone: [phone]   Fax: [phone]    Outpatient PHYSICAL THERAPY: Daily Note 07/12/2014  Fall Risk Score: 1 (? 5 = High Risk)    ICD-10: Treatment Diagnosis: Stiffness of left ankle, not elsewhere classified (M25.672), Pain in left ankle and joints of left foot (M25.572)  REFERRING PHYSICIAN: Overton Mamboyle, Michael J, MD  MD Orders: ROM Ankle, Knee, gentle strengthening, NWB  Return Physician Appointment: 06/30/14  MEDICAL/REFERRING DIAGNOSIS: s/p L ORIF tibia/fib  DATE OF ONSET: 05/31/2014  PRIOR LEVEL OF FUNCTION: no difficulty with walking, stairs, WB activities  PRECAUTIONS/ALLERGIES: pt is on blood thinner med/allergies: codeine, penacillin, toracol, IVP contrast, shell fish, pneumonia vaccine.  ASSESSMENT:  ????????This section established at most recent assessment??????????  [name] is a 49 year old female pt that is s/p L ORIF of tibia and fibula after falling at hospital.  She presents with decreased ROM, strength and swelling at ankle as expected and is to be NWB for at least 12 weeks post surgery. She has PMHx of Factor V and II which increases tendency towards blood clotting that will need to be monitored.  She normally is very active in walking, running, swimming and struggles with depression and Bipolor disorder, so will need help with alternate activities she can do in the mean time.  She normally helps maintain her house as well as cooking but is relying on help with this currently.  She is a good candidate for skilled physical therapy for rehabilitation towards improved independence with functional activities.    PROBLEM LIST (Impairments causing functional limitations):  1. Decreased Strength affecting function  2. Edema affecting function  3. Decreased ADL/Functional Activities  4. Decreased Flexibility/joint mobility  5. Increased Pain affecting function   GOALS: (Goals have been discussed and agreed upon with patient.)  SHORT-TERM FUNCTIONAL GOALS: Time Frame: 4 weeks  1.  L ankle DF > 5 degrees, PF > 40 degrees, inversion > 25 degrees AROM for improved mobility.  2.  Independent with entry level HEP.  3.  L hip, ankle and knee strength >/= 4/5 for return to WB activity  DISCHARGE GOALS: Time Frame: 8-12 weeks  1. L ankle ROM = R ankle ROM in all planes  2. Tolerates walking with lease assistive device 1 mile for community integration.  3. Good balance with single leg stance/weight shift activities.  4. Good strength with up/down 1 set of stairs.  5.  FAAM score >67 for improved overall function.  REHABILITATION POTENTIAL FOR STATED GOALS: GoodPLAN OF CARE:  INTERVENTIONS PLANNED: (Benefits and precautions of physical therapy have been discussed with the patient.)  1. Modalities PRN, including ultrasound, estim, and iontophoresis  2. Soft tissue and joint mobilization for ROM and flexibility  3. Stretching, progressive resistive exercises and HEP for return to functional activities.   4. Back Education and Training for body mechanics with activities of daily living  5. If needed, aquatic therapy.    TREATMENT PLAN EFFECTIVE DATES: 06/22/14 TO 09/22/14  FREQUENCY/DURATION: Follow patient 2 times a week for 12 weeks to address above goals.                  Goal= return to 90 to 95% of where she was at.  Wants to be able to walk her dog.  History of Present Injury/Illness (Reason for Referral): Pt was at hospital having 14th cystoscopy April 15th and as she turned to leave  nurse's station she states that her ankle "gave way."  She is now s/p L ORIF of lateral malleolus and distal tibia on 05/31/14.  Her orders are NWB for 12 weeks.  She states her depression in worsening because of having to stay home and not be mobile.  Present Symptoms:  Pt reports she saw the doctor yesterday because she was worried about an infection. He said everything looked fine. She reports  soreness in the medialankle. .   Dominant Side: right  Past Medical History: has a past medical history of Factor V Leiden mutation (HCC); Infectious disease (02/2009); Factor II deficiency (HCC); Calculus of kidney; Depression; Iron deficiency; Migraines; Pulmonary embolism (HCC) (Multiple episodes); Bipolar disorder (HCC); Anxiety; Restless leg syndrome; and Hematuria, microscopic.  Past Surgical History:  has past surgical history that includes cystoscopy (???); lithotripsy (2006); vascular access (July, 2010); vascular access (Right, 2013); appendectomy (???); tonsillectomy; cesarean section; tubal ligation; ovarian cyst removal; and orthopaedic. cystoscopy x 14; Green Full Filter for restraining blood clot   Current Medications: Norco, vit D3, zofran, protonix, ambien, fioricet/esgic, imitrex, cymbalta, klonopin, coumadin  Date Last Reviewed: 07/12/14  Social History/Home Situation: lives with female room mate who does most of the cooking for her and helps her with house cleaning.  Work/Activity History: on disability because of blood clot disorder/previous to this injury pt liked to run, walk, swim, hike    Outcome Measure:   Tool Used: PT/OT FOOT AND ANKLE ABILITY MEASURE  Score:  Initial: 21/84 Most Recent: X (Date: -- )   Interpretation of Score: For the "Activities of Daily Living", there are 21 questions each scored on a 5 point scale with 0 representing "Unable to do" and 4 representing "No difficulty".  The lower the score, the greater the functional disability. 84/84 represents no disability.  Minimal detectable change is 5.7 points.  With the addition of the 8 questions in the "Sports Subscale," there are 29 questions, each scored on a 5 point scale with 0 representing "Unable to do" and 4 representing "No difficulty".  The lower the score, the greater the functional disability. 116/116 represents no disability.  Minimal detectable change is 12.3 points.    Activities of Daily Living:   Score 84 83-68 67-51 50-34 33-18 17-1 0   Modifier CH CI CJ CK CL CM CN     ? Mobility - Walking and Moving Around:    W0981 - CURRENT STATUS: CL - 60%-79% impaired, limited or restricted   X9147 - GOAL STATUS:  CI - 1%-19% impaired, limited or restricted   W2956 - D/C STATUS:  ---------------To be determined---------------    Observation/Orthostatic Postural Assessment:  Walking:  Pt walks into clinic with 2 crutches, NWB L LE.    Palpation: tender to medial ankle incision   ROM: not assessed today                          Strength:  At eval:  L hip abd = 4/5                 Special Tests:  Mild pain at mid calf with Hoffman's squeeze.  Functional Mobility: independent with compensation  Balance:  Orders are NWB L.  Pt negotiates walking with crutches well.  TREATMENT:    (In addition to Assessment/Re-Assessment sessions the following treatments were rendered)  Therapeutic Exercise: (10 Minutes): for LE and ankle strengthening. Ankle 4-way with yellow t-band 2x10 ea way. SLR 2x10. Sitting  long arc quad 2# 2x10. Quad sets 5" 2x10  Manual Therapy (Left Joint Mobilization Duration  Duration: 30 Minutes    ) for ankle ROM and improved tissue mobilization. myofascial release around lower medial ankle incision with gentle scar mobilization; gentle ROM for ankle DF/PF, inversion/eversion; subtalar and midfoot joint mobilization grade 2-3; talas posterior glide grade 2  Therapeutic Modalities: not today                                                                                              HEP: continue current HEP  ______________________________________________________________________________________________________    Treatment Assessment: No complaints of pain with treatment.  Decreased scar mobility around medial distal incision.   Progression/Medical Necessity:   ?? Patient is expected to demonstrate progress in strength, range of motion and balance to increase independence with walking, WB activities..   Compliance with Program/Exercises:  Appears compliant and pt states she is compliant.  Reason for Continuation of Services/Other Comments:  ?? Patient continues to require skilled intervention due to needs skilled guidance for HEP, manual for ROM.Marland Kitchen  Recommendations/Intent for next treatment session: "Treatment next visit will focus on continue with ROM, gentle strengthening for ankle, quad, core- continue with progression of SLR program.    Total Treatment Duration: 40 min  PT Patient Time In/Time Out  Time In: 1005  Time Out: 1050    Veryl Winemiller Ekman-Suttles, PT

## 2014-07-13 ENCOUNTER — Emergency Department: Admit: 2014-07-14 | Payer: MEDICARE | Primary: Student in an Organized Health Care Education/Training Program

## 2014-07-13 DIAGNOSIS — A419 Sepsis, unspecified organism: Principal | ICD-10-CM

## 2014-07-13 NOTE — ED Provider Notes (Addendum)
HPI Comments: Patient presents to the ER complaining of generalized body aches fevers and chills since earlier today.  Reports "hurting all over".  Does report some nausea and vomiting.    Patient is a 49 y.o. female presenting with general illness.   Generalized Body Aches  This is a new problem. The current episode started 6 to 12 hours ago. The problem occurs constantly. The problem has not changed since onset.Pertinent negatives include no abdominal pain and no shortness of breath. Nothing aggravates the symptoms. Nothing relieves the symptoms.        Past Medical History:   Diagnosis Date   ??? Factor V Leiden mutation (HCC)    ??? Infectious disease 02/2009      Hx MRSA / E.Coli bacteremia, MRSA wound infection 2/2 port   ??? Factor II deficiency (HCC)    ??? Calculus of kidney    ??? Depression    ??? Iron deficiency    ??? Migraines    ??? 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 Bipolar disorder (HCC)    ??? Anxiety    ??? Restless leg syndrome    ??? Hematuria, microscopic        Past Surgical History:   Procedure Laterality Date   ??? Cystoscopy  ???     x 13   ??? Lithotripsy  2006   ??? Hx vascular access  July, 2010     [place]   ??? Hx vascular access Right 2013     pt [facility]   ??? Hx appendectomy  ???   ??? Hx tonsillectomy     ??? Hx cesarean section     ??? Hx tubal ligation     ??? Hx ovarian cyst removal       on R   ??? Hx orthopaedic       left LE         Family History:   Problem Relation Age of Onset   ??? Bleeding Prob Father      Factor V   ??? Cancer Maternal Grandmother      metastatic breast CA.   ??? Ovarian Cancer Mother [id number]     passed away due to ca   ??? Bipolar Disorder Mother    ??? Kidney Disease Brother      kidney stones       History     Social History   ??? Marital Status: DIVORCED     Spouse Name: N/A   ??? Number of Children: N/A   ??? Years of Education: N/A     Occupational History    ??? Not on file.     Social History Main Topics   ??? Smoking status: Never Smoker    ??? Smokeless tobacco: Never Used   ??? Alcohol Use: Yes      Comment: rarely - only drinks 2 times per year   ??? Drug Use: No      Comment: narcotic seeking per hospital hx.   ??? Sexual Activity:     Partners: Male     Birth Control/ Protection: Surgical     Other Topics Concern   ??? Not on file     Social History Narrative         ALLERGIES: Ativan; Codeine; Demerol; Hydrocodone-ibuprofen; Iodinated contrast media - iv dye; Ketorolac tromethamine; Motrin; Pcn; Pneumococcal  vaccine; Pneumovax 23; and Shellfish containing products    Review of Systems   Constitutional: Positive for fever and fatigue. Negative for diaphoresis and unexpected weight change.   HENT: Negative for dental problem and drooling.    Eyes: Negative for photophobia, redness and visual disturbance.   Respiratory: Negative for cough and shortness of breath.    Cardiovascular: Negative for palpitations and leg swelling.   Gastrointestinal: Positive for nausea and vomiting. Negative for abdominal pain.   Endocrine: Negative for polydipsia, polyphagia and polyuria.   Genitourinary: Negative for urgency and flank pain.   Musculoskeletal: Positive for myalgias. Negative for back pain.   Skin: Negative for pallor and rash.   Allergic/Immunologic: Negative for food allergies and immunocompromised state.   Neurological: Negative for light-headedness and numbness.   Hematological: Negative for adenopathy. Does not bruise/bleed easily.   Psychiatric/Behavioral: Negative for behavioral problems and confusion.   All other systems reviewed and are negative.      Filed Vitals:    07/13/14 2206   BP: 129/78   Pulse: 122   Temp: 101.6 ??F (38.7 ??C)   Resp: 22   Height: 5' 4.5" (1.638 m)   Weight: 66.225 kg (146 lb)   SpO2: 93%            Physical Exam   Constitutional: She is oriented to person, place, and time. She appears well-developed and well-nourished. No distress.   HENT:    Head: Normocephalic and atraumatic.   Mouth/Throat: Oropharynx is clear and moist. No oropharyngeal exudate.   Eyes: Conjunctivae and EOM are normal. Pupils are equal, round, and reactive to light. No scleral icterus.   Neck: Normal range of motion. Neck supple. No JVD present. No tracheal deviation present. No thyromegaly present.   Cardiovascular: Regular rhythm.  Tachycardia present.  Exam reveals no gallop.    No murmur heard.  Pulmonary/Chest: Effort normal and breath sounds normal. No respiratory distress. She has no rales. She exhibits no tenderness.   Abdominal: Soft. Bowel sounds are normal. She exhibits no distension. There is no tenderness.   Musculoskeletal: Normal range of motion.        Left lower leg: She exhibits no swelling and no edema.   Left lower extremity wound is clean dry and intact   Lymphadenopathy:     She has no cervical adenopathy.   Neurological: She is alert and oriented to person, place, and time. No cranial nerve deficit. Coordination normal.   Skin: Skin is warm and dry. No rash noted. She is not diaphoretic. No erythema.   Nursing note and vitals reviewed.       MDM  Number of Diagnoses or Management Options  Diagnosis management comments: Differential diagnoses include sepsis, influenza, UTI, Viral Syndrome, Pneumonia       Amount and/or Complexity of Data Reviewed  Clinical lab tests: ordered and reviewed  Tests in the radiology section of CPT??: ordered and reviewed    Risk of Complications, Morbidity, and/or Mortality  Presenting problems: high  Diagnostic procedures: moderate  Management options: high    Patient Progress  Patient progress: stable      Procedures       XR CHEST PA LAT (Final result) Result time: 07/14/14 00:10:42   ?? Final result by Penne Lash, MD (07/14/14 00:10:42)   ?? Impression:   ?? IMPRESSION:    Right upper lobe pneumonia. Follow-up radiographs in 4 weeks is recommended to  evaluate for complete resolution.  DC4     ?? Narrative:    ?? Frontal and lateral views of the chest     COMPARISON: March 16, 2014    INDICATION: Fever, cough    FINDINGS:     There is an alveolar consolidation in the right upper lobe. Left lung is  relatively clear. No pneumothorax or pleural effusion. No pulmonary edema.  Cardiac mediastinal contour is within normal limits. Surrounding bones are  intact. Right-sided chest port is stable.     ??

## 2014-07-13 NOTE — Progress Notes (Signed)
Pt not ready to go to x-ray at this time. Will check back later

## 2014-07-13 NOTE — ED Notes (Signed)
Physician refused to give pt pain meds.  Pt is crying and yelling at primary RN and not understanding why she is not getting medicine for her headache.  Charge RN was informed and Dr. Manson PasseyBrown was asked to step in and talk with pt.    Dr. Manson PasseyBrown is at bedside.

## 2014-07-13 NOTE — ED Notes (Signed)
Headache and body aches and fever, sobbing through triage, took tylenol prior to coming here

## 2014-07-13 NOTE — Progress Notes (Signed)
Pt not ready to go to x-ray at this time. Will check back after next pt.

## 2014-07-14 ENCOUNTER — Inpatient Hospital Stay
Admit: 2014-07-14 | Discharge: 2014-07-17 | Disposition: A | Discharge status: Home / Self Care | Payer: MEDICARE | Attending: Internal Medicine | Admitting: Internal Medicine

## 2014-07-14 ENCOUNTER — Inpatient Hospital Stay: Payer: MEDICARE | Primary: Student in an Organized Health Care Education/Training Program

## 2014-07-14 LAB — PROTHROMBIN TIME + INR
INR: 1 (ref 0.9–1.2)
Prothrombin time: 10.9 s (ref 9.6–12.0)

## 2014-07-14 LAB — CBC WITH AUTOMATED DIFF
ABS. BASOPHILS: 0 10*3/uL (ref 0.0–0.2)
ABS. EOSINOPHILS: 0.5 10*3/uL (ref 0.0–0.8)
ABS. IMM. GRANS.: 0 10*3/uL (ref 0.0–0.5)
ABS. LYMPHOCYTES: 1.2 10*3/uL (ref 0.5–4.6)
ABS. MONOCYTES: 1 10*3/uL (ref 0.1–1.3)
ABS. NEUTROPHILS: 10.6 10*3/uL — ABNORMAL HIGH (ref 1.7–8.2)
BASOPHILS: 0 % (ref 0.0–2.0)
EOSINOPHILS: 4 % (ref 0.5–7.8)
HCT: 32.4 % — ABNORMAL LOW (ref 35.8–46.3)
HGB: 10.2 g/dL — ABNORMAL LOW (ref 11.7–15.4)
IMMATURE GRANULOCYTES: 0.2 % (ref 0.0–5.0)
LYMPHOCYTES: 9 % — ABNORMAL LOW (ref 13–44)
MCH: 26.9 PG (ref 26.1–32.9)
MCHC: 31.5 g/dL (ref 31.4–35.0)
MCV: 85.5 FL (ref 79.6–97.8)
MONOCYTES: 7 % (ref 4.0–12.0)
MPV: 10.4 FL — ABNORMAL LOW (ref 10.8–14.1)
NEUTROPHILS: 80 % — ABNORMAL HIGH (ref 43–78)
PLATELET: 245 10*3/uL (ref 150–450)
RBC: 3.79 M/uL — ABNORMAL LOW (ref 4.05–5.25)
RDW: 16.3 % — ABNORMAL HIGH (ref 11.9–14.6)
WBC: 13.3 10*3/uL — ABNORMAL HIGH (ref 4.3–11.1)

## 2014-07-14 LAB — URINALYSIS W/ RFLX MICROSCOPIC
Bilirubin: NEGATIVE
Blood: NEGATIVE
Casts: 0 /lpf
Glucose: NEGATIVE mg/dL
Ketone: NEGATIVE mg/dL
Nitrites: NEGATIVE
Protein: NEGATIVE mg/dL
Specific gravity: 1.021 (ref 1.001–1.023)
Urobilinogen: 0.2 EU/dL (ref 0.2–1.0)
pH (UA): 5.5 (ref 5.0–9.0)

## 2014-07-14 LAB — MRSA SCREEN - PCR (NASAL)

## 2014-07-14 LAB — METABOLIC PANEL, COMPREHENSIVE
A-G Ratio: 1.3 (ref 1.2–3.5)
ALT (SGPT): 18 U/L (ref 12–65)
AST (SGOT): 16 U/L (ref 15–37)
Albumin: 3.4 g/dL — ABNORMAL LOW (ref 3.5–5.0)
Alk. phosphatase: 94 U/L (ref 50–136)
Anion gap: 9 mmol/L (ref 7–16)
BUN: 16 MG/DL (ref 6–23)
Bilirubin, total: 0.2 MG/DL (ref 0.2–1.1)
CO2: 25 mmol/L (ref 21–32)
Calcium: 7.8 MG/DL — ABNORMAL LOW (ref 8.3–10.4)
Chloride: 108 mmol/L — ABNORMAL HIGH (ref 98–107)
Creatinine: 1.09 MG/DL — ABNORMAL HIGH (ref 0.6–1.0)
GFR est AA: >60 mL/min/{1.73_m2} (ref >60)
GFR est non-AA: 57 mL/min/{1.73_m2} — ABNORMAL LOW (ref >60)
Globulin: 2.7 g/dL (ref 2.3–3.5)
Glucose: 111 mg/dL — ABNORMAL HIGH (ref 65–100)
Potassium: 3.4 mmol/L — ABNORMAL LOW (ref 3.5–5.1)
Protein, total: 6.1 g/dL — ABNORMAL LOW (ref 6.3–8.2)
Sodium: 142 mmol/L (ref 136–145)

## 2014-07-14 LAB — LACTIC ACID: Lactic acid: 1.4 MMOL/L (ref 0.4–2.0)

## 2014-07-14 LAB — INFLUENZA A & B AG (RAPID TEST)
Influenza A Ag: NEGATIVE
Influenza B Ag: NEGATIVE

## 2014-07-14 LAB — PROCALCITONIN: Procalcitonin: 0.1 ng/mL

## 2014-07-14 MED ORDER — ACETAMINOPHEN 325 MG TABLET
325 mg | ORAL | Status: DC | PRN
Start: 2014-07-14 — End: 2014-07-17
  Administered 2014-07-15: 19:00:00 via ORAL

## 2014-07-14 MED ORDER — SODIUM CHLORIDE 0.9% BOLUS IV
0.9 % | Freq: Once | INTRAVENOUS | Status: DC
Start: 2014-07-14 — End: 2014-07-13

## 2014-07-14 MED ORDER — ZOLPIDEM 5 MG TAB
5 mg | Freq: Every evening | ORAL | Status: DC | PRN
Start: 2014-07-14 — End: 2014-07-17
  Administered 2014-07-15 – 2014-07-16 (×2): via ORAL

## 2014-07-14 MED ORDER — SODIUM CHLORIDE 0.9 % IJ SYRG
Freq: Three times a day (TID) | INTRAMUSCULAR | Status: DC
Start: 2014-07-14 — End: 2014-07-17
  Administered 2014-07-14 – 2014-07-17 (×9)

## 2014-07-14 MED ORDER — BUTALBITAL-ACETAMINOPHEN-CAFFEINE 50 MG-325 MG-40 MG TAB
50-325-40 mg | ORAL | Status: DC | PRN
Start: 2014-07-14 — End: 2014-07-17
  Administered 2014-07-14: 16:00:00 via ORAL

## 2014-07-14 MED ORDER — SODIUM CHLORIDE 0.9% BOLUS IV
0.9 % | Freq: Once | INTRAVENOUS | Status: AC
Start: 2014-07-14 — End: 2014-07-14
  Administered 2014-07-14: 03:00:00 via INTRAVENOUS

## 2014-07-14 MED ORDER — SODIUM CHLORIDE 0.9 % IJ SYRG
INTRAMUSCULAR | Status: DC | PRN
Start: 2014-07-14 — End: 2014-07-17

## 2014-07-14 MED ORDER — SODIUM CHLORIDE 0.9% BOLUS IV
0.9 % | Freq: Once | INTRAVENOUS | Status: AC
Start: 2014-07-14 — End: 2014-07-17
  Administered 2014-07-14: 04:00:00 via INTRAVENOUS

## 2014-07-14 MED ORDER — OXYCODONE 5 MG TAB
5 mg | ORAL | Status: DC | PRN
Start: 2014-07-14 — End: 2014-07-17
  Administered 2014-07-14 (×2): via ORAL

## 2014-07-14 MED ORDER — TOBRAMYCIN 40 MG/ML INJECTION
40 mg/mL | INTRAMUSCULAR | Status: DC
Start: 2014-07-14 — End: 2014-07-14
  Administered 2014-07-14: 09:00:00 via INTRAVENOUS

## 2014-07-14 MED ORDER — DIPHENHYDRAMINE 25 MG CAP
25 mg | Freq: Four times a day (QID) | ORAL | Status: DC | PRN
Start: 2014-07-14 — End: 2014-07-17
  Administered 2014-07-16: 17:00:00 via ORAL

## 2014-07-14 MED ORDER — SODIUM CHLORIDE 0.9 % IV PIGGY BACK
2 gram | Freq: Three times a day (TID) | INTRAVENOUS | Status: DC
Start: 2014-07-14 — End: 2014-07-17
  Administered 2014-07-14 – 2014-07-17 (×9): via INTRAVENOUS

## 2014-07-14 MED ORDER — VANCOMYCIN IN 0.9 % SODIUM CHLORIDE 1.25 GRAM/250 ML IV
1.25 gram/250 mL | Freq: Two times a day (BID) | INTRAVENOUS | Status: DC
Start: 2014-07-14 — End: 2014-07-16
  Administered 2014-07-14 – 2014-07-16 (×4): via INTRAVENOUS

## 2014-07-14 MED ORDER — ONDANSETRON (PF) 4 MG/2 ML INJECTION
4 mg/2 mL | Freq: Four times a day (QID) | INTRAMUSCULAR | Status: DC | PRN
Start: 2014-07-14 — End: 2014-07-17
  Administered 2014-07-14 – 2014-07-16 (×4): via INTRAVENOUS

## 2014-07-14 MED ORDER — LORAZEPAM 2 MG/ML IJ SOLN
2 mg/mL | Freq: Once | INTRAMUSCULAR | Status: AC
Start: 2014-07-14 — End: 2014-07-14
  Administered 2014-07-14: 06:00:00 via INTRAVENOUS

## 2014-07-14 MED ORDER — ENOXAPARIN 40 MG/0.4 ML SUB-Q SYRINGE
40 mg/0.4 mL | SUBCUTANEOUS | Status: DC
Start: 2014-07-14 — End: 2014-07-15
  Administered 2014-07-14 – 2014-07-15 (×2): via SUBCUTANEOUS

## 2014-07-14 MED ORDER — HYDROMORPHONE (PF) 1 MG/ML IJ SOLN
1 mg/mL | INTRAMUSCULAR | Status: DC
Start: 2014-07-14 — End: 2014-07-14

## 2014-07-14 MED ORDER — SODIUM CHLORIDE 0.9% BOLUS IV
0.9 % | Freq: Once | INTRAVENOUS | Status: DC
Start: 2014-07-14 — End: 2014-07-14
  Administered 2014-07-14: 06:00:00 via INTRAVENOUS

## 2014-07-14 MED ORDER — ACETAMINOPHEN 500 MG TAB
500 mg | ORAL | Status: DC
Start: 2014-07-14 — End: 2014-07-13

## 2014-07-14 MED ORDER — HYDROMORPHONE (PF) 1 MG/ML IJ SOLN
1 mg/mL | INTRAMUSCULAR | Status: AC
Start: 2014-07-14 — End: 2014-07-13
  Administered 2014-07-14: 04:00:00 via INTRAVENOUS

## 2014-07-14 MED ORDER — LORAZEPAM 0.5 MG TAB
0.5 mg | Freq: Once | ORAL | Status: AC
Start: 2014-07-14 — End: 2014-07-14
  Administered 2014-07-15: via ORAL

## 2014-07-14 MED ORDER — METHYLPREDNISOLONE (PF) 40 MG/ML IJ SOLR
40 mg/mL | Freq: Once | INTRAMUSCULAR | Status: AC
Start: 2014-07-14 — End: 2014-07-14
  Administered 2014-07-14: 15:00:00 via INTRAVENOUS

## 2014-07-14 MED ORDER — DULOXETINE 30 MG CAP, DELAYED RELEASE
30 mg | Freq: Two times a day (BID) | ORAL | Status: DC
Start: 2014-07-14 — End: 2014-07-17
  Administered 2014-07-14 – 2014-07-17 (×7): via ORAL

## 2014-07-14 MED ORDER — PROMETHAZINE 25 MG/ML INJECTION
25 mg/mL | Freq: Four times a day (QID) | INTRAMUSCULAR | Status: DC | PRN
Start: 2014-07-14 — End: 2014-07-17
  Administered 2014-07-14 – 2014-07-17 (×3): via INTRAMUSCULAR

## 2014-07-14 MED ORDER — IPRATROPIUM-ALBUTEROL 2.5 MG-0.5 MG/3 ML NEB SOLUTION
2.5 mg-0.5 mg/3 ml | RESPIRATORY_TRACT | Status: DC | PRN
Start: 2014-07-14 — End: 2014-07-17
  Administered 2014-07-15 – 2014-07-16 (×2): via RESPIRATORY_TRACT

## 2014-07-14 MED ORDER — AZTREONAM 2 GRAM SOLUTION FOR INJECTION
2 gram | Freq: Three times a day (TID) | INTRAMUSCULAR | Status: DC
Start: 2014-07-14 — End: 2014-07-14

## 2014-07-14 MED ORDER — ADV ADDAPTOR
1 gram | Status: AC
Start: 2014-07-14 — End: 2014-07-14
  Administered 2014-07-14 (×2): via INTRAVENOUS

## 2014-07-14 MED ORDER — LEVOFLOXACIN IN D5W 750 MG/150 ML IV PIGGY BACK
750 mg/150 mL | INTRAVENOUS | Status: AC
Start: 2014-07-14 — End: 2014-07-14
  Administered 2014-07-14: 05:00:00 via INTRAVENOUS

## 2014-07-14 MED ORDER — BUDESONIDE 0.5 MG/2 ML NEB SUSPENSION
0.5 mg/2 mL | Freq: Two times a day (BID) | RESPIRATORY_TRACT | Status: DC
Start: 2014-07-14 — End: 2014-07-17
  Administered 2014-07-15 – 2014-07-17 (×6): via RESPIRATORY_TRACT

## 2014-07-14 MED ORDER — SUMATRIPTAN 50 MG TAB
50 mg | Freq: Three times a day (TID) | ORAL | Status: DC | PRN
Start: 2014-07-14 — End: 2014-07-17

## 2014-07-14 MED ORDER — PANTOPRAZOLE 40 MG TAB, DELAYED RELEASE
40 mg | Freq: Two times a day (BID) | ORAL | Status: DC
Start: 2014-07-14 — End: 2014-07-17
  Administered 2014-07-14 – 2014-07-17 (×7): via ORAL

## 2014-07-14 MED ORDER — ADV ADDAPTOR
1000 mg | Freq: Once | Status: AC
Start: 2014-07-14 — End: 2014-07-15
  Administered 2014-07-14: 06:00:00 via INTRAVENOUS

## 2014-07-14 MED ORDER — ONDANSETRON (PF) 4 MG/2 ML INJECTION
4 mg/2 mL | INTRAMUSCULAR | Status: AC
Start: 2014-07-14 — End: 2014-07-13
  Administered 2014-07-14: 03:00:00 via INTRAVENOUS

## 2014-07-14 MED ORDER — SODIUM CHLORIDE 0.9 % IV
40 mg/mL | INTRAVENOUS | Status: DC
Start: 2014-07-14 — End: 2014-07-17
  Administered 2014-07-15 – 2014-07-17 (×3): via INTRAVENOUS

## 2014-07-14 MED ORDER — ADV ADDAPTOR
1000 mg | Freq: Once | Status: DC
Start: 2014-07-14 — End: 2014-07-14

## 2014-07-14 MED ORDER — ONDANSETRON (PF) 4 MG/2 ML INJECTION
4 mg/2 mL | INTRAMUSCULAR | Status: AC
Start: 2014-07-14 — End: 2014-07-13
  Administered 2014-07-14: 04:00:00 via INTRAVENOUS

## 2014-07-14 MED ORDER — HYDROMORPHONE (PF) 1 MG/ML IJ SOLN
1 mg/mL | INTRAMUSCULAR | Status: DC | PRN
Start: 2014-07-14 — End: 2014-07-14
  Administered 2014-07-14 (×2): via INTRAVENOUS

## 2014-07-14 MED ORDER — SODIUM CHLORIDE 0.9 % IV
INTRAVENOUS | Status: DC
Start: 2014-07-14 — End: 2014-07-17
  Administered 2014-07-14 – 2014-07-17 (×8): via INTRAVENOUS

## 2014-07-14 MED ORDER — GUAIFENESIN SR 600 MG TAB
600 mg | Freq: Two times a day (BID) | ORAL | Status: DC
Start: 2014-07-14 — End: 2014-07-17
  Administered 2014-07-14 – 2014-07-17 (×8): via ORAL

## 2014-07-14 MED ORDER — OXYCODONE-ACETAMINOPHEN 5 MG-325 MG TAB
5-325 mg | ORAL | Status: DC | PRN
Start: 2014-07-14 — End: 2014-07-14
  Administered 2014-07-15: via ORAL

## 2014-07-14 MED ORDER — HYDROMORPHONE (PF) 1 MG/ML IJ SOLN
1 mg/mL | INTRAMUSCULAR | Status: AC
Start: 2014-07-14 — End: 2014-07-14
  Administered 2014-07-14: 06:00:00 via INTRAVENOUS

## 2014-07-14 MED ORDER — CLONAZEPAM 1 MG TAB
1 mg | Freq: Three times a day (TID) | ORAL | Status: DC
Start: 2014-07-14 — End: 2014-07-17
  Administered 2014-07-14 – 2014-07-17 (×10): via ORAL

## 2014-07-14 MED ORDER — BENZONATATE 100 MG CAP
100 mg | Freq: Three times a day (TID) | ORAL | Status: DC | PRN
Start: 2014-07-14 — End: 2014-07-17

## 2014-07-14 MED FILL — OXYCODONE 5 MG TAB: 5 mg | ORAL | Qty: 1

## 2014-07-14 MED FILL — CLONAZEPAM 1 MG TAB: 1 mg | ORAL | Qty: 1

## 2014-07-14 MED FILL — ONDANSETRON (PF) 4 MG/2 ML INJECTION: 4 mg/2 mL | INTRAMUSCULAR | Qty: 2

## 2014-07-14 MED FILL — HYDROMORPHONE (PF) 1 MG/ML IJ SOLN: 1 mg/mL | INTRAMUSCULAR | Qty: 1

## 2014-07-14 MED FILL — LORAZEPAM 2 MG/ML IJ SOLN: 2 mg/mL | INTRAMUSCULAR | Qty: 1

## 2014-07-14 MED FILL — SODIUM CHLORIDE 0.9 % IV PIGGY BACK: INTRAVENOUS | Qty: 100

## 2014-07-14 MED FILL — VANCOMYCIN 1,000 MG IV SOLR: 1000 mg | INTRAVENOUS | Qty: 1000

## 2014-07-14 MED FILL — AZTREONAM 1 GRAM SOLUTION FOR INJECTION: 1 gram | INTRAMUSCULAR | Qty: 1

## 2014-07-14 MED FILL — VANCOMYCIN IN 0.9 % SODIUM CHLORIDE 1.25 GRAM/250 ML IV: 1.25 gram/250 mL | INTRAVENOUS | Qty: 250

## 2014-07-14 MED FILL — GUAIFENESIN SR 600 MG TAB: 600 mg | ORAL | Qty: 1

## 2014-07-14 MED FILL — PANTOPRAZOLE 40 MG TAB, DELAYED RELEASE: 40 mg | ORAL | Qty: 1

## 2014-07-14 MED FILL — DULOXETINE 30 MG CAP, DELAYED RELEASE: 30 mg | ORAL | Qty: 1

## 2014-07-14 MED FILL — PROMETHAZINE 25 MG/ML INJECTION: 25 mg/mL | INTRAMUSCULAR | Qty: 1

## 2014-07-14 MED FILL — TOBRAMYCIN 40 MG/ML INJECTION: 40 mg/mL | INTRAMUSCULAR | Qty: 7

## 2014-07-14 MED FILL — ADV ADDAPTOR: Qty: 1

## 2014-07-14 MED FILL — LOVENOX 40 MG/0.4 ML SUBCUTANEOUS SYRINGE: 40 mg/0.4 mL | SUBCUTANEOUS | Qty: 0.4

## 2014-07-14 MED FILL — SOLU-MEDROL (PF) 40 MG/ML SOLUTION FOR INJECTION: 40 mg/mL | INTRAMUSCULAR | Qty: 1

## 2014-07-14 MED FILL — AZTREONAM 2 GRAM SOLUTION FOR INJECTION: 2 gram | INTRAMUSCULAR | Qty: 2

## 2014-07-14 MED FILL — BUTALBITAL-ACETAMINOPHEN-CAFFEINE 50 MG-325 MG-40 MG TAB: 50-325-40 mg | ORAL | Qty: 1

## 2014-07-14 MED FILL — SODIUM CHLORIDE 0.9 % IV: INTRAVENOUS | Qty: 1000

## 2014-07-14 MED FILL — LEVOFLOXACIN IN D5W 750 MG/150 ML IV PIGGY BACK: 750 mg/150 mL | INTRAVENOUS | Qty: 150

## 2014-07-14 NOTE — Progress Notes (Signed)
Ambien  5 mg given PO for sleep. Second sandwich tray given for c/o contnued hunger. No c/o pain. No distress noted.

## 2014-07-14 NOTE — Progress Notes (Signed)
Interdisciplinary Rounds:    Mrs Krista Lopez stated she does not feel any better since being admitted.  She still has chest pain and wheezing.  She now complains of a headache.  Her nurse Daniel Nonesortia is bringing something for pain.    Artemio AlyKevin E Cher Egnor PharmD, BCPS

## 2014-07-14 NOTE — Progress Notes (Signed)
Pharmacy to dose:    Will continue with Vancomycin 1250 mg IV every 12 hours and Tobramycin 280 mg IV every 24 hours.  Will continue to monitor daily.      Artemio AlyKevin E Rick Warnick PharmD, BCPS

## 2014-07-14 NOTE — Progress Notes (Signed)
Hospitalist Follow Up Progress Note    07/14/2014       NAME: Krista AgeeShannon Lopez   DOB:  [birth year]   MRN:  [id number]   Attending: Marthe PatchAlan D Laszczynski, DO  PCP:  Shirline FreesJAY D WALLS, MD  Treatment Team: Attending Provider: Marthe PatchAlan D Laszczynski, DO    Patient seen and examined, complaining of headache. Cough and shortness of breath has improved. No chest pain no palpitations. States startes to wheeze every now and then    BP 119/68 mmHg   Pulse 103   Temp(Src) 99.2 ??F (37.3 ??C)   Resp 18   Ht 5' 4.5" (1.638 m)   Wt 66.225 kg (146 lb)   BMI 24.68 kg/m2   SpO2 91%   LMP 07/28/2004  Active Hospital Problems    Diagnosis Date Noted   ??? Sepsis (HCC) 07/14/2014   ??? Right upper lobe pneumonia 07/14/2014   ??? Bipolar 2 disorder (HCC) 04/26/2013   ??? Factor V Leiden mutation (HCC) 04/26/2013     Patient does not have Factor V leiden mutation. [facility] are negative for Factor V leiden mutation     ??? Depression 04/26/2013     Plan:  Sepsis secondary to Pneumonia - will continue antibiotics today, patient wheezing a little will start on duonebs. Continue to monitor. She states she feels better than on admission    Migraine- patient current main complaint, will give dose of steroids start imitrix, hold dilauded. Will reassess after treatment    Bipolar - will continue home medications    Luane SchoolJohann G Yaneisy Wenz, MD            Luane SchoolJohann G Abbee Cremeens, MD

## 2014-07-14 NOTE — Progress Notes (Signed)
Pt c/o chest pain from coughing 8/10 , received prn oxycodone 5 mg , see mar

## 2014-07-14 NOTE — Progress Notes (Signed)
Ativan 0.5 mg given PO to calm.

## 2014-07-14 NOTE — Progress Notes (Signed)
Oxycodone 10 mg given PO for discomfort.

## 2014-07-14 NOTE — Progress Notes (Signed)
Problem: Interdisciplinary Rounds  Goal: Interdisciplinary Rounds  Interdisciplinary team rounds were held 07/14/2014 with the following team members:Care Management, Pharmacy and Clinical Coordinator and the patient.    Plan of care discussed. See clinical pathway and/or care plan for interventions and desired outcomes.

## 2014-07-14 NOTE — Progress Notes (Signed)
Reported to Dr. Milda SmartLaszczynski pt's anxiety and c/o continued pain, right chest, described as "sharp"; orders received.

## 2014-07-14 NOTE — Progress Notes (Signed)
Assessment completed. Alert and oriented x4. Resting in bed quietly.HOB elevated. Respirations even and unlabored. Lung sounds clear bilaterally. S1S2 audible. Bowel sounds active in all four quadrants. IVF infusing without difficulty to right chest port.Patient tearful at this time. Provided reassurance.  Call light in reach. Bed low and locked. Side rails up x2. Patient encouraged to call for further needs.

## 2014-07-14 NOTE — H&P (Signed)
HOSPITALIST H&P    NAME:  Krista Lopez   Age:  49 y.o.  DOB:   1965/06/04   MRN:   [id number]  PCP: Zerita Boers, MD  Chief complaint: headache, fevers, cough    Subjective:     Patient is a 49 y.o. white  female who presents with headache, fever, and cough.   Pt states symptoms started within the past day.  She states that she coughed for 45 minutes straight prior to coming in to ED.  She is having chills and body aches as well.  Pt mostly concerned about her headache at the moment and states it feels worse than her normal migraines.  She is tearful.    Past Medical History   Diagnosis Date   ??? Factor V Leiden mutation ([place])    ??? Infectious disease 02/2009      Hx MRSA / E.Coli bacteremia, MRSA wound infection 2/2 port   ??? Factor II deficiency ([place])    ??? Calculus of kidney    ??? Depression    ??? Iron deficiency    ??? Migraines    ??? 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 Bipolar disorder ([place])    ??? Anxiety    ??? Restless leg syndrome    ??? Hematuria, microscopic        Past Surgical History   Procedure Laterality Date   ??? Cystoscopy  ???     x 13   ??? Lithotripsy  2006   ??? Hx vascular access  July, 2010     Power Port placed in [place], [place] removed   ??? Hx vascular access Right 2013     pt has current rt subclavian port - [facility]   ??? Hx appendectomy  ???   ??? Hx tonsillectomy     ??? Hx cesarean section     ??? Hx tubal ligation     ??? Hx ovarian cyst removal       on R   ??? Hx orthopaedic       left LE       No current facility-administered medications on file prior to encounter.     Current Outpatient Prescriptions on File Prior to Encounter   Medication Sig Dispense Refill   ??? warfarin (COUMADIN) 5 mg tablet Take 1 Tab by mouth daily. 30 Tab 0   ??? cholecalciferol (VITAMIN D3) 1,000 unit cap Take  by mouth daily.     ??? ondansetron (ZOFRAN ODT) 8 mg disintegrating tablet Take 1 Tab by mouth every twelve (12) hours as needed for Nausea. 10 Tab 0    ??? pantoprazole (PROTONIX) 40 mg tablet Take 40 mg by mouth two (2) times a day.     ??? zolpidem (AMBIEN) 10 mg tablet Take  by mouth nightly as needed for Sleep.     ??? butalbital-acetaminophen-caffeine (FIORICET, ESGIC) 50-325-40 mg per tablet Take 1 Tab by mouth every four (4) hours as needed for Pain or Headache. Max Daily Amount: 6 Tabs. 20 Tab 0   ??? SUMAtriptan (IMITREX) 100 mg tablet Take 1 Tab by mouth once as needed for Migraine. 20 Tab 0   ??? DULoxetine (CYMBALTA) 60 mg capsule Take 3 caps every day.  Indications: ANXIETY WITH DEPRESSION (Patient taking differently: 30 mg two (2) times a day. Pt states she take $Remove'30mg'SxWKVmc$  BID  Indications: ANXIETY WITH DEPRESSION) 90 Cap 0   ??? clonazePAM (  KLONOPIN) 1 mg tablet Take 1 mg by mouth three (3) times daily.         Allergies   Allergen Reactions   ??? Ativan [Lorazepam] Itching   ??? Codeine Rash   ??? Demerol [Meperidine] Itching     *     ??? Hydrocodone-Ibuprofen Unknown (comments)   ??? Iodinated Contrast Media - Iv Dye Rash     States also has swelling, and allergic to shellfish   ??? Ketorolac Tromethamine Rash   ??? Motrin [Ibuprofen] Other (comments)     Causes stomach upset after 3 doses.   ??? Pcn [Penicillins] Hives   ??? Pneumococcal Vaccine Swelling   ??? Pneumovax 23 [Pneumococcal 23-Val Ps Vaccine] Other (comments)     Local reaction   ??? Shellfish Containing Products Rash       History   Substance Use Topics   ??? Smoking status: Never Smoker    ??? Smokeless tobacco: Never Used   ??? Alcohol Use: Yes      Comment: rarely - only drinks 2 times per year       Family History   Problem Relation Age of Onset   ??? Bleeding Prob Father      Factor V   ??? Cancer Maternal Grandmother      metastatic breast CA.   ??? Ovarian Cancer Mother 71     passed away due to ca   ??? Bipolar Disorder Mother    ??? Kidney Disease Brother      kidney stones       I have personally reviewed and reconciled patients history.    Review of Systems   A comprehensive 12 point review of systems is negative other than what is listed above.    Objective:     Patient Vitals for the past 24 hrs:   BP Temp Pulse Resp SpO2 Height Weight   07/14/14 0051 - 99.2 ??F (37.3 ??C) - - - - -   07/14/14 0040 121/60 mmHg - (!) 121 - (!) 85 % - -   07/14/14 0018 - - (!) 123 - 92 % - -   07/14/14 0016 157/69 mmHg - - - - - -   07/13/14 2206 129/78 mmHg (!) 101.6 ??F (38.7 ??C) (!) 122 22 93 % 5' 4.5" (1.638 m) 66.225 kg (146 lb)       Exam:  General: awake, alert, no apparent distress  Eyes: PERRL, anicteric  Neck: Supple, trachea midline  Lungs: Clear to auscultation bilaterally.  No rales, wheezes, or rhonchi  Heart: Regular and tachycardic.  No appreciable murmur.  Abdomen: Soft, nontender, nondistended.  Bowel sounds normal.  No rebound tenderness, guarding, or rigidity.  Extremities:  No LE edema.  Skin: Warm/dry. No rashes or lesions.  Neurologic: CN II-XII grossly intact bilaterally.  Psych: AOx3.  Tearful.      Data Review (Labs):   Recent Results (from the past 24 hour(s))   INFLUENZA A & B AG (RAPID TEST)    Collection Time: 07/13/14 10:13 PM   Result Value Ref Range    Influenza A Ag NEGATIVE  NEG      Influenza B Ag NEGATIVE  NEG     CBC WITH AUTOMATED DIFF    Collection Time: 07/13/14 10:45 PM   Result Value Ref Range    WBC 13.3 (H) 4.3 - 11.1 K/uL    RBC 3.79 (L) 4.05 - 5.25 M/uL    HGB 10.2 (L) 11.7 - 15.4 g/dL  HCT 32.4 (L) 35.8 - 46.3 %    MCV 85.5 79.6 - 97.8 FL    MCH 26.9 26.1 - 32.9 PG    MCHC 31.5 31.4 - 35.0 g/dL    RDW 11.2 (H) 16.2 - 14.6 %    PLATELET 245 150 - 450 K/uL    MPV 10.4 (L) 10.8 - 14.1 FL    DF AUTOMATED      NEUTROPHILS 80 (H) 43 - 78 %    LYMPHOCYTES 9 (L) 13 - 44 %    MONOCYTES 7 4.0 - 12.0 %    EOSINOPHILS 4 0.5 - 7.8 %    BASOPHILS 0 0.0 - 2.0 %    IMMATURE GRANULOCYTES 0.2 0.0 - 5.0 %    ABS. NEUTROPHILS 10.6 (H) 1.7 - 8.2 K/UL    ABS. LYMPHOCYTES 1.2 0.5 - 4.6 K/UL    ABS. MONOCYTES 1.0 0.1 - 1.3 K/UL     ABS. EOSINOPHILS 0.5 0.0 - 0.8 K/UL    ABS. BASOPHILS 0.0 0.0 - 0.2 K/UL    ABS. IMM. GRANS. 0.0 0.0 - 0.5 K/UL   METABOLIC PANEL, COMPREHENSIVE    Collection Time: 07/13/14 10:45 PM   Result Value Ref Range    Sodium 142 136 - 145 mmol/L    Potassium 3.4 (L) 3.5 - 5.1 mmol/L    Chloride 108 (H) 98 - 107 mmol/L    CO2 25 21 - 32 mmol/L    Anion gap 9 7 - 16 mmol/L    Glucose 111 (H) 65 - 100 mg/dL    BUN 16 6 - 23 MG/DL    Creatinine 4.46 (H) 0.6 - 1.0 MG/DL    GFR est AA >95 >07 KU/VJD/0.51G3    GFR est non-AA 57 (L) >60 ml/min/1.68m2    Calcium 7.8 (L) 8.3 - 10.4 MG/DL    Bilirubin, total 0.2 0.2 - 1.1 MG/DL    ALT 18 12 - 65 U/L    AST 16 15 - 37 U/L    Alk. phosphatase 94 50 - 136 U/L    Protein, total 6.1 (L) 6.3 - 8.2 g/dL    Albumin 3.4 (L) 3.5 - 5.0 g/dL    Globulin 2.7 2.3 - 3.5 g/dL    A-G Ratio 1.3 1.2 - 3.5     PROTHROMBIN TIME + INR    Collection Time: 07/13/14 10:45 PM   Result Value Ref Range    Prothrombin time 10.9 9.6 - 12.0 sec    INR 1.0 0.9 - 1.2     LACTIC ACID, PLASMA    Collection Time: 07/13/14 11:55 PM   Result Value Ref Range    Lactic acid 1.4 0.4 - 2.0 MMOL/L   URINALYSIS W/ RFLX MICROSCOPIC    Collection Time: 07/13/14 11:55 PM   Result Value Ref Range    Color YELLOW      Appearance CLOUDY      Specific gravity 1.021 1.001 - 1.023      pH (UA) 5.5 5.0 - 9.0      Protein NEGATIVE  NEG mg/dL    Glucose NEGATIVE  mg/dL    Ketone NEGATIVE  NEG mg/dL    Bilirubin NEGATIVE  NEG      Blood NEGATIVE  NEG      Urobilinogen 0.2 0.2 - 1.0 EU/dL    Nitrites NEGATIVE  NEG      Leukocyte Esterase SMALL (A) NEG      WBC 10-20 0 /hpf    RBC 0-3  0 /hpf    Epithelial cells 5-10 0 /hpf    Bacteria 1+ (H) 0 /hpf    Casts 0 0 /lpf       Assessment:   Principal Problem:    Sepsis ([place]) (07/14/2014)    Active Problems:    Depression (04/26/2013)      Bipolar 2 disorder ([place]) (04/26/2013)      Factor V Leiden mutation ([place]) (04/26/2013)      Overview: Patient does not have Factor V leiden mutation. Testing at  [facility] and also       [facility] in [facility] in 2010 are negative for Factor V leiden mutation      Right upper lobe pneumonia (07/14/2014)        Plan:     Admit to medical floor  Empiric aztreonam, vanc, tobramycin for HCAP  Blood cultures x 2 in ED  IVF  Mucinex  Tessalon PRN    DVT prophylaxis: Lovenox  Code Status: FULL    Plan of care discussed with: patient  Time spent on patient care: 30 minutes  Anticipated date of discharge: 3 days    Tod Persia, DO

## 2014-07-14 NOTE — Progress Notes (Signed)
TRANSFER - IN REPORT:    Verbal report received from Elbertaatherine, RN(name) on [facility]  being received from ER(unit) for routine progression of care      Report consisted of patient???s Situation, Background, Assessment and   Recommendations(SBAR).     Information from the following report(s) Kardex, ED Summary, Intake/Output, MAR and Recent Results was reviewed with the receiving nurse.    Opportunity for questions and clarification was provided.      Assessment completed upon patient???s arrival to unit and care assumed.

## 2014-07-14 NOTE — Progress Notes (Signed)
Vitals assessed. Resp even, unlab at 24 bpm, pulse 105, anxious, upset. Skin warm, dry. Lungs sounds coarse on right, clear on left. Informed nurse to call Md.

## 2014-07-14 NOTE — Progress Notes (Signed)
Pharmacokinetic Consult to Pharmacist    Ron AgeeShannon Lopez is a 49 y.o. female being treated for HCAP with aztreonam, tobramycin, and vancomycin.    Height: 5' 4.5" (163.8 cm)  Weight: 66.225 kg (146 lb)  Lab Results   Component Value Date/Time    BUN 16 07/13/2014 10:45 PM    CREATININE 1.09 07/13/2014 10:45 PM    WBC 13.3 07/13/2014 10:45 PM    PROCALCITONIN 0.1 07/13/2014 10:45 PM      Estimated Creatinine Clearance: 56.9 mL/min (based on Cr of 1.09).    CULTURES:  6/2 Flu negative   MRSA screen pending   UC pending   BC x 2 pending    Day 1 of vancomycin.  Goal trough is 15-20.  Vancomycin 1 g given in the ED; will follow up with 1.25 g q12h.  Plan trough prior to 4th dose.  Day 1 of tobramycin.  Will initiate 280 mg (5 mg/kg, ibw) q36h, order random level 10 hours after infusion, and reassess tx at that time.  Will continue to follow patient.      Thank you,  Larena SoxJosh Gariboldi, PharmD  Pharmacist  [phone]

## 2014-07-14 NOTE — Progress Notes (Signed)
Resting quietly, awake with no c/o. Entire sandwich meal consumed. Requesting sleeping pill.

## 2014-07-14 NOTE — Progress Notes (Signed)
Informed by patient that she had a" liquidly dark stool, started suddenly", unmeasured hat place in toilet and instructted patient to notify staff of another episode.

## 2014-07-14 NOTE — Progress Notes (Signed)
HOB elevated, no acute distress, lungs right upper lobe diminished breath sounds anterior, essentially clear throughout other areas,  No SOB or cough, neurovascular checks WNL, no complaints of calf pain.

## 2014-07-14 NOTE — Progress Notes (Signed)
HOB elevated, complained of a headache, given one tablet Fioricet po as ordered.

## 2014-07-14 NOTE — Progress Notes (Signed)
Resting quietly, tearful, anxious with c/o unrelieved right sided chest pain, resp even, unlab.Bedside report received from pt's previous nurse, Baldo AshPortia Botchway, RN. Pt aware nurse to check pain med options.

## 2014-07-14 NOTE — Progress Notes (Signed)
Complains of a headache and nausea,"it's like a storm in my head", given Dilaudid 1 mg IV and Phenergan 12.5 mg IM right gluteus maxima, states pain level 8.

## 2014-07-14 NOTE — Progress Notes (Signed)
Blood specimen drawn for tobramycin level from via right chest wall port, sent to lab

## 2014-07-14 NOTE — Progress Notes (Signed)
Resting quietly, awake, calm, body and facial expression relaxed with no further c/o. Upon nurse inquiring about discomfort, pt reports feeling better, pain rated "6". Sandwich tray given for c/o hunger, stating "I was in so much pain at supper, I could not eat it".

## 2014-07-14 NOTE — ED Notes (Signed)
TRANSFER - OUT REPORT:    Verbal report given to Tiffany [facility]  [facility] transferred to [facility] 358  for routine progression of care       Report consisted of patient???s Situation, Background, Assessment and   Recommendations(SBAR).     Information from the following report(s) ED Summary was reviewed with the receiving nurse.    Lines:   Peripheral IV 07/13/14 Other (Comment) Other(comment) (Active)        Opportunity for questions and clarification was provided.      Patient transported with:   Registered Nurse

## 2014-07-15 ENCOUNTER — Inpatient Hospital Stay: Admit: 2014-07-15 | Payer: MEDICARE | Primary: Student in an Organized Health Care Education/Training Program

## 2014-07-15 LAB — CBC WITH AUTOMATED DIFF
ABS. BASOPHILS: 0 10*3/uL (ref 0.0–0.2)
ABS. EOSINOPHILS: 0 10*3/uL (ref 0.0–0.8)
ABS. IMM. GRANS.: 0 10*3/uL (ref 0.0–0.5)
ABS. LYMPHOCYTES: 1.1 10*3/uL (ref 0.5–4.6)
ABS. MONOCYTES: 0.7 10*3/uL (ref 0.1–1.3)
ABS. NEUTROPHILS: 7.3 10*3/uL (ref 1.7–8.2)
BASOPHILS: 0 % (ref 0.0–2.0)
EOSINOPHILS: 0 % — ABNORMAL LOW (ref 0.5–7.8)
HCT: 29.6 % — ABNORMAL LOW (ref 35.8–46.3)
HGB: 9.2 g/dL — ABNORMAL LOW (ref 11.7–15.4)
IMMATURE GRANULOCYTES: 0.3 % (ref 0.0–5.0)
LYMPHOCYTES: 12 % — ABNORMAL LOW (ref 13–44)
MCH: 26.5 PG (ref 26.1–32.9)
MCHC: 31.1 g/dL — ABNORMAL LOW (ref 31.4–35.0)
MCV: 85.3 FL (ref 79.6–97.8)
MONOCYTES: 8 % (ref 4.0–12.0)
MPV: 10 FL — ABNORMAL LOW (ref 10.8–14.1)
NEUTROPHILS: 80 % — ABNORMAL HIGH (ref 43–78)
PLATELET: 191 10*3/uL (ref 150–450)
RBC: 3.47 M/uL — ABNORMAL LOW (ref 4.05–5.25)
RDW: 16.8 % — ABNORMAL HIGH (ref 11.9–14.6)
WBC: 9.1 10*3/uL (ref 4.3–11.1)

## 2014-07-15 LAB — METABOLIC PANEL, BASIC
Anion gap: 9 mmol/L (ref 7–16)
BUN: 6 MG/DL (ref 6–23)
CO2: 24 mmol/L (ref 21–32)
Calcium: 8.4 MG/DL (ref 8.3–10.4)
Chloride: 110 mmol/L — ABNORMAL HIGH (ref 98–107)
Creatinine: 0.75 MG/DL (ref 0.6–1.0)
GFR est AA: >60 mL/min/{1.73_m2} (ref >60)
GFR est non-AA: >60 mL/min/{1.73_m2} (ref >60)
Glucose: 129 mg/dL — ABNORMAL HIGH (ref 65–100)
Potassium: 4 mmol/L (ref 3.5–5.1)
Sodium: 143 mmol/L (ref 136–145)

## 2014-07-15 LAB — TOBRAMYCIN, RANDOM: Tobramycin,random: 0.9 ug/ml

## 2014-07-15 LAB — VANCOMYCIN, TROUGH: Vancomycin,trough: 12.8 ug/mL (ref 5–20)

## 2014-07-15 MED ORDER — IOPAMIDOL 76 % IV SOLN
370 mg iodine /mL (76 %) | Freq: Once | INTRAVENOUS | Status: AC
Start: 2014-07-15 — End: 2014-07-15
  Administered 2014-07-15: 17:00:00 via INTRAVENOUS

## 2014-07-15 MED ORDER — ENOXAPARIN 80 MG/0.8 ML SUB-Q SYRINGE
80 mg/0.8 mL | Freq: Two times a day (BID) | SUBCUTANEOUS | Status: DC
Start: 2014-07-15 — End: 2014-07-17
  Administered 2014-07-16 – 2014-07-17 (×3): via SUBCUTANEOUS

## 2014-07-15 MED ORDER — PHARMACY VANCOMYCIN NOTE
Freq: Once | Status: AC
Start: 2014-07-15 — End: 2014-07-15
  Administered 2014-07-15: 16:00:00

## 2014-07-15 MED ORDER — RIVAROXABAN 15 MG TAB
15 mg | Freq: Two times a day (BID) | ORAL | Status: DC
Start: 2014-07-15 — End: 2014-07-15

## 2014-07-15 MED ORDER — SODIUM CHLORIDE 0.9% BOLUS IV
0.9 % | Freq: Once | INTRAVENOUS | Status: AC
Start: 2014-07-15 — End: 2014-07-17
  Administered 2014-07-15: 17:00:00 via INTRAVENOUS

## 2014-07-15 MED ORDER — HYDROXYZINE 25 MG TAB
25 mg | ORAL | Status: AC
Start: 2014-07-15 — End: 2014-07-15
  Administered 2014-07-15: 17:00:00 via ORAL

## 2014-07-15 MED ORDER — DIPHENHYDRAMINE HCL 50 MG/ML IJ SOLN
50 mg/mL | Freq: Once | INTRAMUSCULAR | Status: AC
Start: 2014-07-15 — End: 2014-07-15
  Administered 2014-07-15: 15:00:00 via INTRAVENOUS

## 2014-07-15 MED ORDER — METHYLPREDNISOLONE (PF) 125 MG/2 ML IJ SOLR
125 mg/2 mL | Freq: Once | INTRAMUSCULAR | Status: AC
Start: 2014-07-15 — End: 2014-07-15
  Administered 2014-07-15: 15:00:00 via INTRAVENOUS

## 2014-07-15 MED ORDER — OXYCODONE 5 MG TAB
5 mg | ORAL | Status: DC | PRN
Start: 2014-07-15 — End: 2014-07-17
  Administered 2014-07-15 – 2014-07-17 (×15): via ORAL

## 2014-07-15 MED ORDER — SALINE PERIPHERAL FLUSH PRN
Freq: Once | INTRAMUSCULAR | Status: AC
Start: 2014-07-15 — End: 2014-07-15
  Administered 2014-07-15: 17:00:00

## 2014-07-15 MED FILL — LORAZEPAM 0.5 MG TAB: 0.5 mg | ORAL | Qty: 1

## 2014-07-15 MED FILL — OXYCODONE 5 MG TAB: 5 mg | ORAL | Qty: 2

## 2014-07-15 MED FILL — BUDESONIDE 0.5 MG/2 ML NEB SUSPENSION: 0.5 mg/2 mL | RESPIRATORY_TRACT | Qty: 1

## 2014-07-15 MED FILL — CLONAZEPAM 1 MG TAB: 1 mg | ORAL | Qty: 1

## 2014-07-15 MED FILL — GUAIFENESIN SR 600 MG TAB: 600 mg | ORAL | Qty: 1

## 2014-07-15 MED FILL — AZTREONAM 2 GRAM SOLUTION FOR INJECTION: 2 gram | INTRAMUSCULAR | Qty: 2

## 2014-07-15 MED FILL — DIPHENHYDRAMINE HCL 50 MG/ML IJ SOLN: 50 mg/mL | INTRAMUSCULAR | Qty: 1

## 2014-07-15 MED FILL — LOVENOX 40 MG/0.4 ML SUBCUTANEOUS SYRINGE: 40 mg/0.4 mL | SUBCUTANEOUS | Qty: 0.4

## 2014-07-15 MED FILL — ZOLPIDEM 5 MG TAB: 5 mg | ORAL | Qty: 1

## 2014-07-15 MED FILL — PANTOPRAZOLE 40 MG TAB, DELAYED RELEASE: 40 mg | ORAL | Qty: 1

## 2014-07-15 MED FILL — VANCOMYCIN IN 0.9 % SODIUM CHLORIDE 1.25 GRAM/250 ML IV: 1.25 gram/250 mL | INTRAVENOUS | Qty: 250

## 2014-07-15 MED FILL — DULOXETINE 30 MG CAP, DELAYED RELEASE: 30 mg | ORAL | Qty: 1

## 2014-07-15 MED FILL — HYDROXYZINE 25 MG TAB: 25 mg | ORAL | Qty: 1

## 2014-07-15 MED FILL — TYLENOL 325 MG TABLET: 325 mg | ORAL | Qty: 2

## 2014-07-15 MED FILL — OXYCODONE 5 MG TAB: 5 mg | ORAL | Qty: 1

## 2014-07-15 MED FILL — PROMETHAZINE 25 MG/ML INJECTION: 25 mg/mL | INTRAMUSCULAR | Qty: 1

## 2014-07-15 MED FILL — XARELTO 15 MG TABLET: 15 mg | ORAL | Qty: 1

## 2014-07-15 MED FILL — SOLU-MEDROL (PF) 125 MG/2 ML SOLUTION FOR INJECTION: 125 mg/2 mL | INTRAMUSCULAR | Qty: 2

## 2014-07-15 MED FILL — ONDANSETRON (PF) 4 MG/2 ML INJECTION: 4 mg/2 mL | INTRAMUSCULAR | Qty: 2

## 2014-07-15 MED FILL — OXYCODONE-ACETAMINOPHEN 5 MG-325 MG TAB: 5-325 mg | ORAL | Qty: 2

## 2014-07-15 MED FILL — IPRATROPIUM-ALBUTEROL 2.5 MG-0.5 MG/3 ML NEB SOLUTION: 2.5 mg-0.5 mg/3 ml | RESPIRATORY_TRACT | Qty: 3

## 2014-07-15 MED FILL — TOBRAMYCIN 40 MG/ML INJECTION: 40 mg/mL | INTRAMUSCULAR | Qty: 7

## 2014-07-15 MED FILL — PHARMACY VANCOMYCIN NOTE: Qty: 1

## 2014-07-15 NOTE — Progress Notes (Signed)
Continues to rest quietly, awake with no c/o. No distress. Bedside report given to oncoming nurse, Rulon Seraenise Lupinski, RN.

## 2014-07-15 NOTE — Progress Notes (Signed)
Ambien 5 mg given PO for sleep, per pt request, followed by Oxycodone 10 mg given PO for c/o pain rated "7".

## 2014-07-15 NOTE — Progress Notes (Signed)
Pharmacy to dose:    Continue with Tobramycin 280 mg IV every 24 hours.  Vancomycin random trough ordered for today at 12 pm.  Will evaluate after the results populate.    Artemio AlyKevin E Parv Manthey PharmD, BCPS

## 2014-07-15 NOTE — Progress Notes (Signed)
Oxycodone 10 mg given PO for c/o returned pain rated "7".

## 2014-07-15 NOTE — Other (Signed)
Assessment completed. All safety measures in place. No complaints at this time.

## 2014-07-15 NOTE — Progress Notes (Signed)
Awoke with c/o pain rated "8"; Oxycodone 10 mg given PO. Agrees to call for asst to be out of bed if steadiness questionable.

## 2014-07-15 NOTE — Progress Notes (Signed)
Resting quietly, arousing easily, resp even, unlab with O2 intact at 2L N/C. Skin warm, dry, pale. Lungs sounds clear, diminished in bases. Right chest port site and dsg clean, dry, intact. Calm, relaxed, quiet, reporting does not feel good.

## 2014-07-15 NOTE — Progress Notes (Signed)
Hospitalist Progress Note    07/15/2014  Admit Date: 07/14/2014  1:12 AM   NAME: Krista AgeeShannon Geddes   DOB:  Nov 21, 1965   MRN:  [id number]   Attending: Luane SchoolJohann G Lacresha Fusilier, MD  PCP:  Shirline FreesJAY D WALLS, MD      Admitted WUJ:[id number]:sepsis, pneumonia    SUBJECTIVE:     Patient this morning, states that she is feeling better. Still a little pleuritic chest pain, right sided. No nausea no vomiting, tolerating diet. Shortness of breath improved. Coughing a little            Review of Systems negative with exception of pertinent positives noted above  Past medical history unchanged from H&P    PHYSICAL EXAM   BP 127/73 mmHg   Pulse 94   Temp(Src) 98.1 ??F (36.7 ??C)   Resp 18   Ht 5' 4.5" (1.638 m)   Wt 66.225 kg (146 lb)   BMI 24.68 kg/m2   SpO2 95%   LMP 07/28/2004   Temp (24hrs), Avg:98.2 ??F (36.8 ??C), Min:96.9 ??F (36.1 ??C), Max:99.8 ??F (37.7 ??C)    Oxygen Therapy  O2 Sat (%): 95 % (07/15/14 0720)  Pulse via Oximetry: 122 beats per minute (07/14/14 0100)  O2 Device: Nasal cannula (07/14/14 2056)  O2 Flow Rate (L/min): 2 l/min (07/14/14 2056)    Intake/Output Summary (Last 24 hours) at 07/15/14 0930  Last data filed at 07/15/14 0532   Gross per 24 hour   Intake   2001 ml   Output   1275 ml   Net    726 ml        General: No acute distress????mucous membranes pink and moist acyanotic anicteric  Neck:  Supple full range of motion  Lungs:  Air entry equal bilaterally, crepitations right sided. rales rhonchi   Heart:  Regular rate and rhythm,?? No murmur, rub, or gallop  Abdomen: Soft, Non distended, Non tender, no rebound guarding Positive bowel sounds  Extremities: No cyanosis, clubbing or edema  Neurologic:?? No focal deficits  Musculoskeletal: no   Joint swelling tenderness erythema  Skin:  No erythema, rashes noted          LAB  No results for input(s): GLUCPOC in the last 72 hours.    Invalid input(s): [facility]   Recent Labs      07/15/14   0650  07/13/14   2245   WBC  9.1  13.3*   HGB  9.2*  10.2*   HCT  29.6*  32.4*   PLT  191  245    INR   --   1.0     Recent Labs      07/15/14   0650  07/13/14   2245   NA  143  142   K  4.0  3.4*   CL  110*  108*   CO2  24  25   GLU  129*  111*   BUN  6  16   CREA  0.75  1.09*   CA  8.4  7.8*   ALB   --   3.4*   TBILI   --   0.2   ALT   --   18   SGOT   --   16       EKG and imaging reviewed personally by me  Xr Chest Port    07/15/2014   EXAM:  XR CHEST PORT  INDICATION:  Pneumonia  COMPARISON:  07/13/2014  FINDINGS: A portable AP radiograph of  the chest was obtained at 0629 hours. The patient is on a cardiac monitor.  Right upper lobe pneumonia improved. Infuse-a-Port catheter in adequate position..  The cardiac and mediastinal contours and pulmonary vascularity are normal.  The bones and soft tissues are grossly within normal limits.      07/15/2014   IMPRESSION: Right upper lobe pneumonia improved.     Results for orders placed or performed during the hospital encounter of 07/06/14   EKG, 12 LEAD, INITIAL   Result Value Ref Range    Systolic BP  mmHg    Diastolic BP  mmHg    Ventricular Rate 94 BPM    Atrial Rate 94 BPM    P-R Interval 164 ms    QRS Duration 80 ms    Q-T Interval 352 ms    QTC Calculation (Bezet) 440 ms    Calculated P Axis 64 degrees    Calculated R Axis 92 degrees    Calculated T Axis 51 degrees    Diagnosis       Normal sinus rhythm  Rightward axis  Nonspecific ST abnormality  Abnormal ECG  When compared with ECG of 27-Apr-2014 12:03,  No significant change was found  Confirmed by CEBE  MD (UC), JOHN E ([id number]) on 07/06/2014 7:19:26 PM       XR Results (most recent):    Results from Hospital Encounter encounter on 07/14/14   XR CHEST PORT   Narrative EXAM:  XR CHEST PORT    INDICATION:  Pneumonia    COMPARISON:  07/13/2014    FINDINGS: A portable AP radiograph of the chest was obtained at 0629 hours. The  patient is on a cardiac monitor.  Right upper lobe pneumonia improved.  Infuse-a-Port catheter in adequate position..  The cardiac and mediastinal   contours and pulmonary vascularity are normal.  The bones and soft tissues are  grossly within normal limits.          Impression IMPRESSION: Right upper lobe pneumonia improved.          Active problems  Active Hospital Problems    Diagnosis Date Noted   ??? Sepsis (HCC) 07/14/2014   ??? Right upper lobe pneumonia 07/14/2014   ??? Bipolar 2 disorder (HCC) 04/26/2013   ??? Factor V Leiden mutation (HCC) 04/26/2013     Patient does not have Factor V leiden mutation. Testing at [facility] and also here in [facility] in 2010 are negative for Factor V leiden mutation     ??? Depression 04/26/2013       ASSESSMENT  AND PLAN      Right upper lobe pneumonia- will continue patient on antibiotics. WBC improving patient clinically improving, given chest pain, will get CTA r/o embolism    Bipolar -will continue home medications    Sepsis - as above    DVT Prophylaxis: Enoxaparin  Patient remains high risk for decompensation, given  Sepsis, pneumonia.     Luane School, MD

## 2014-07-15 NOTE — Progress Notes (Signed)
Before nurse leaving room, pt tearful, anxious, requesting IV pain med for discomfort. Aware nurse to assess pain med options.

## 2014-07-15 NOTE — Other (Signed)
Labs drawn and sent. Patient to ct via wheelchair.

## 2014-07-15 NOTE — Progress Notes (Signed)
Resting quietly, resp even, unlab.  Eyes closed with relaxed facial expression.  No distress noted.

## 2014-07-15 NOTE — Other (Signed)
Premedicated per mar for ct.

## 2014-07-15 NOTE — Progress Notes (Signed)
Nurse entered room to reassess pt's discomfort, remaining at bedside, pt did not awaken. Resting quietly, resp even, unlab with O2 intact. Facial expression relaxed with no distress noted.

## 2014-07-15 NOTE — Progress Notes (Signed)
Awake with no c/o. No distress noted.

## 2014-07-15 NOTE — Progress Notes (Signed)
Resting quietly, resp even, unlab.  Eyes closed with relaxed facial expression.  No distress noted. Report received from pt's previous nurse, Rulon Seraenise Lupinski, RN.

## 2014-07-16 LAB — CULTURE, URINE
Culture result:: 10000
Culture: 10000

## 2014-07-16 LAB — CBC WITH AUTOMATED DIFF
ABS. BASOPHILS: 0 10*3/uL (ref 0.0–0.2)
ABS. EOSINOPHILS: 0 10*3/uL (ref 0.0–0.8)
ABS. IMM. GRANS.: 0.1 10*3/uL (ref 0.0–0.5)
ABS. LYMPHOCYTES: 0.9 10*3/uL (ref 0.5–4.6)
ABS. MONOCYTES: 1.2 10*3/uL (ref 0.1–1.3)
ABS. NEUTROPHILS: 9 10*3/uL — ABNORMAL HIGH (ref 1.7–8.2)
BASOPHILS: 0 % (ref 0.0–2.0)
EOSINOPHILS: 0 % — ABNORMAL LOW (ref 0.5–7.8)
HCT: 28.7 % — ABNORMAL LOW (ref 35.8–46.3)
HGB: 8.9 g/dL — ABNORMAL LOW (ref 11.7–15.4)
IMMATURE GRANULOCYTES: 0.4 % (ref 0.0–5.0)
LYMPHOCYTES: 8 % — ABNORMAL LOW (ref 13–44)
MCH: 26.9 PG (ref 26.1–32.9)
MCHC: 31 g/dL — ABNORMAL LOW (ref 31.4–35.0)
MCV: 86.7 FL (ref 79.6–97.8)
MONOCYTES: 10 % (ref 4.0–12.0)
MPV: 10.7 FL — ABNORMAL LOW (ref 10.8–14.1)
NEUTROPHILS: 82 % — ABNORMAL HIGH (ref 43–78)
PLATELET: 201 10*3/uL (ref 150–450)
RBC: 3.31 M/uL — ABNORMAL LOW (ref 4.05–5.25)
RDW: 17.2 % — ABNORMAL HIGH (ref 11.9–14.6)
WBC: 11.1 10*3/uL (ref 4.3–11.1)

## 2014-07-16 LAB — METABOLIC PANEL, BASIC
Anion gap: 5 mmol/L — ABNORMAL LOW (ref 7–16)
BUN: 9 MG/DL (ref 6–23)
CO2: 29 mmol/L (ref 21–32)
Calcium: 8.4 MG/DL (ref 8.3–10.4)
Chloride: 109 mmol/L — ABNORMAL HIGH (ref 98–107)
Creatinine: 0.72 MG/DL (ref 0.6–1.0)
GFR est AA: >60 mL/min/{1.73_m2} (ref >60)
GFR est non-AA: >60 mL/min/{1.73_m2} (ref >60)
Glucose: 125 mg/dL — ABNORMAL HIGH (ref 65–100)
Potassium: 4.6 mmol/L (ref 3.5–5.1)
Sodium: 143 mmol/L (ref 136–145)

## 2014-07-16 MED ORDER — VANCOMYCIN IN 0.9% SODIUM CHLORIDE 1.5 G/500 ML IV
1.5 g/500 mL | Freq: Two times a day (BID) | INTRAVENOUS | Status: DC
Start: 2014-07-16 — End: 2014-07-17
  Administered 2014-07-16 – 2014-07-17 (×2): via INTRAVENOUS

## 2014-07-16 MED FILL — AZTREONAM 2 GRAM SOLUTION FOR INJECTION: 2 gram | INTRAMUSCULAR | Qty: 2

## 2014-07-16 MED FILL — GUAIFENESIN SR 600 MG TAB: 600 mg | ORAL | Qty: 1

## 2014-07-16 MED FILL — DULOXETINE 30 MG CAP, DELAYED RELEASE: 30 mg | ORAL | Qty: 1

## 2014-07-16 MED FILL — OXYCODONE 5 MG TAB: 5 mg | ORAL | Qty: 2

## 2014-07-16 MED FILL — ONDANSETRON (PF) 4 MG/2 ML INJECTION: 4 mg/2 mL | INTRAMUSCULAR | Qty: 2

## 2014-07-16 MED FILL — VANCOMYCIN IN 0.9 % SODIUM CHLORIDE 1.25 GRAM/250 ML IV: 1.25 gram/250 mL | INTRAVENOUS | Qty: 250

## 2014-07-16 MED FILL — VANCOMYCIN IN 0.9% SODIUM CHLORIDE 1.5 G/500 ML IV: 1.5 g/500 mL | INTRAVENOUS | Qty: 500

## 2014-07-16 MED FILL — PANTOPRAZOLE 40 MG TAB, DELAYED RELEASE: 40 mg | ORAL | Qty: 1

## 2014-07-16 MED FILL — TOBRAMYCIN 40 MG/ML INJECTION: 40 mg/mL | INTRAMUSCULAR | Qty: 7

## 2014-07-16 MED FILL — ZOLPIDEM 5 MG TAB: 5 mg | ORAL | Qty: 1

## 2014-07-16 MED FILL — IPRATROPIUM-ALBUTEROL 2.5 MG-0.5 MG/3 ML NEB SOLUTION: 2.5 mg-0.5 mg/3 ml | RESPIRATORY_TRACT | Qty: 3

## 2014-07-16 MED FILL — CLONAZEPAM 1 MG TAB: 1 mg | ORAL | Qty: 1

## 2014-07-16 MED FILL — LOVENOX 80 MG/0.8 ML SUBCUTANEOUS SYRINGE: 80 mg/0.8 mL | SUBCUTANEOUS | Qty: 0.8

## 2014-07-16 MED FILL — BUDESONIDE 0.5 MG/2 ML NEB SUSPENSION: 0.5 mg/2 mL | RESPIRATORY_TRACT | Qty: 1

## 2014-07-16 MED FILL — DIPHENHYDRAMINE 25 MG CAP: 25 mg | ORAL | Qty: 1

## 2014-07-16 MED FILL — PROMETHAZINE 25 MG/ML INJECTION: 25 mg/mL | INTRAMUSCULAR | Qty: 1

## 2014-07-16 NOTE — Progress Notes (Signed)
Resting quietly, resp even, unlab with O2.  Eyes closed with relaxed facial expression.  No distress noted.

## 2014-07-16 NOTE — Progress Notes (Signed)
Hospitalist Progress Note    07/16/2014  Admit Date: 07/14/2014  1:12 AM   NAME: Krista AgeeShannon Lopez   DOB:  [birth year]   MRN:  [id number]   Attending: Luane SchoolJohann G Evana Runnels, MD  PCP:  Shirline FreesJAY D WALLS, MD      Admitted WGN:[id number]:pneumonia, PE    SUBJECTIVE:     Patient this morning doing better, no fever, shortness of breath and chest pain doing better. Moving to chair well.     Patient admitted with sepsis RUL pneumonia, and PE. On lovenox at home.       Review of Systems negative with exception of pertinent positives noted above  Past medical history unchanged from H&P    PHYSICAL EXAM   BP 143/77 mmHg   Pulse 76   Temp(Src) 99.7 ??F (37.6 ??C)   Resp 20   Ht 5' 4.5" (1.638 m)   Wt 66.225 kg (146 lb)   BMI 24.68 kg/m2   SpO2 99%   LMP 07/28/2004   Temp (24hrs), Avg:99.1 ??F (37.3 ??C), Min:97.8 ??F (36.6 ??C), Max:99.9 ??F (37.7 ??C)    Oxygen Therapy  O2 Sat (%): 99 % (07/16/14 0752)  Pulse via Oximetry: 122 beats per minute (07/14/14 0100)  O2 Device: Nasal cannula (07/15/14 2005)  O2 Flow Rate (L/min): 2 l/min (07/15/14 2005)    Intake/Output Summary (Last 24 hours) at 07/16/14 0854  Last data filed at 07/16/14 0434   Gross per 24 hour   Intake   3033 ml   Output   2350 ml   Net    683 ml        General: No acute distress????mucous membranes pink and moist acyanotic anicteric  Neck:  Supple full range of motion  Lungs:  Air entry equal bilaterally, no crepitations rales rhonchi   Heart:  Regular rate and rhythm,?? No murmur, rub, or gallop  Abdomen: Soft, Non distended, Non tender, no rebound guarding Positive bowel sounds  Extremities: No cyanosis, clubbing or edema  Neurologic:?? No focal deficits  Musculoskeletal: no   Joint swelling tenderness erythema  Skin:  No erythema, rashes noted          LAB  No results for input(s): GLUCPOC in the last 72 hours.    Invalid input(s): [facility]   Recent Labs      07/16/14   0635   07/13/14   2245   WBC  11.1   < >  13.3*   HGB  8.9*   < >  10.2*   HCT  28.7*   < >  32.4*   PLT  201   < >  245    INR   --    --   1.0    < > = values in this interval not displayed.     Recent Labs      07/16/14   0635   07/13/14   2245   NA  143   < >  142   K  4.6   < >  3.4*   CL  109*   < >  108*   CO2  29   < >  25   GLU  125*   < >  111*   BUN  9   < >  16   CREA  0.72   < >  1.09*   CA  8.4   < >  7.8*   ALB   --    --   3.4*  TBILI   --    --   0.2   ALT   --    --   18   SGOT   --    --   16    < > = values in this interval not displayed.       EKG and imaging reviewed personally by me  Ct Chest W Cont    07/15/2014   CT CHEST WITH CONTRAST 07/15/2014  HISTORY: Right-sided pleuritic chest pain. MRSA bacteremia. Multiple prior pulmonary emboli.  TECHNIQUE: The patient received 100 mL Isovue-370 nonionic IV contrast and axial images were obtained through the chest. Coronal reformatted images were generated.   All CT scans at this facility used dose modulation, interactive reconstruction and/or weight based dosing when appropriate to reduce radiation dose to as low as reasonably achievable.  COMPARISON: October 05, 2008  FINDINGS: There is a nonocclusive filling defect within a right middle lobe pulmonary artery (series 7 image 136). A few of the right lower lobe pulmonary arterial branches are not well evaluated because of motion artifact.  The thoracic aorta is normal in appearance.  Diffuse bilateral consolidation is present, particularly in the right upper lobe.  The pleural spaces are clear. There is no thoracic adenopathy. A right-sided chest port is present with catheter tip in the SVC. Included portions of the upper abdomen demonstrate normal-appearing adrenal glands. There are no aggressive osseous lesions.     07/15/2014   IMPRESSION:  1. Small nonocclusive right middle lobe pulmonary embolism.  2. Diffuse consolidation.  [facility] The critical results contained in this report were communicated to the patient's nurse, by me on 07/15/2014 at 1:40 PM after an unsuccessful attempt to reach the covering physician at 1:25 PM.        Results for orders placed or performed during the hospital encounter of 07/06/14   EKG, 12 LEAD, INITIAL   Result Value Ref Range    Systolic BP  mmHg    Diastolic BP  mmHg    Ventricular Rate 94 BPM    Atrial Rate 94 BPM    P-R Interval 164 ms    QRS Duration 80 ms    Q-T Interval 352 ms    QTC Calculation (Bezet) 440 ms    Calculated P Axis 64 degrees    Calculated R Axis 92 degrees    Calculated T Axis 51 degrees    Diagnosis       Normal sinus rhythm  Rightward axis  Nonspecific ST abnormality  Abnormal ECG  When compared with ECG of 27-Apr-2014 12:03,  No significant change was found  Confirmed by CEBE  MD (UC), JOHN E ([id number]) on 07/06/2014 7:19:26 PM       XR Results (most recent):    Results from Hospital Encounter encounter on 07/14/14   XR CHEST PORT   Narrative EXAM:  XR CHEST PORT    INDICATION:  Pneumonia    COMPARISON:  07/13/2014    FINDINGS: A portable AP radiograph of the chest was obtained at 0629 hours. The  patient is on a cardiac monitor.  Right upper lobe pneumonia improved.  Infuse-a-Port catheter in adequate position..  The cardiac and mediastinal  contours and pulmonary vascularity are normal.  The bones and soft tissues are  grossly within normal limits.          Impression IMPRESSION: Right upper lobe pneumonia improved.          Active problems  Active Hospital Problems  Diagnosis Date Noted   ??? Sepsis (HCC) 07/14/2014   ??? Right upper lobe pneumonia 07/14/2014   ??? Bipolar 2 disorder (HCC) 04/26/2013   ??? Factor V Leiden mutation (HCC) 04/26/2013     Patient does not have Factor V leiden mutation. Testing at [facility] and also here in [facility] in 2010 are negative for Factor V leiden mutation     ??? 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  DVT Prophylaxis: lovenox     Dispo - 1 day home    Luane School, MD

## 2014-07-16 NOTE — Progress Notes (Signed)
Pt C/O pain 7/10 to upper chest and back, given Roxicodone 10 mg, see MAR.

## 2014-07-16 NOTE — Progress Notes (Signed)
Bedside shift change report given to Olegario MessierKathy, Charity fundraiserN (oncoming nurse) by Marcelino DusterMichelle, RN (offgoing nurse). Report included the following information SBAR, ED Summary, Intake/Output, MAR and Recent Results.  Pt sleeping quietly in bed without distress.

## 2014-07-16 NOTE — Progress Notes (Signed)
Awake with no c/o. No distress noted.

## 2014-07-16 NOTE — Progress Notes (Signed)
Resting quietly, awake with no c/o.  No distress noted. Bedside report given to oncoming nurse, Michelle Gale, RN.

## 2014-07-16 NOTE — Progress Notes (Signed)
Oxycodone 10 mg given PO for c/o pain rated "8".

## 2014-07-16 NOTE — Progress Notes (Signed)
Resting quietly, awake with no c/o. Resp even, unlab with O2 at 2L N/C, skin warm, dry. Lungs sounds clear, diminished in bases. Reports voiding without difficulty, and having emptied urine collector earlier. No distress noted.

## 2014-07-16 NOTE — Progress Notes (Signed)
Pt requests nausea medication, given Zofran 4 mg IVP slow, see MAR.

## 2014-07-16 NOTE — Progress Notes (Signed)
Was called into patient's room, pt states "I must have eaten something bad my lips are swollen."  Pt assessed, lips do not appear swollen, pt without distress, respirations and even and unlabored, pt speaking without difficulty, charge nurse aware.  Pt given benadryl 25 mg PO, will continue to monitor.     Pt C/O pain 8/10 to chest and back, given Oxycodone 10 mg, see MAR.

## 2014-07-16 NOTE — Progress Notes (Signed)
Resting quietly, resp even, unlab wth O2 intact.  Eyes closed with relaxed facial expression.  No distress noted. Bedside report received from pt's previous nurse, Maurine MinisterMichelle Gale, RN.

## 2014-07-16 NOTE — Progress Notes (Signed)
Awake with c/o pain and nausea; Zofran 4 mg given IVP slowly, followed by Oxycodone 10 mg PO. Continues to void without difficulty.

## 2014-07-16 NOTE — Progress Notes (Signed)
Pharmacy to dose Vancomycin and Tobramycin:    Lab Results   Component Value Date/Time    VANCOMYCIN,TROUGH 12.8 07/15/2014 12:10 PM       Will increase Vancomycin to 1500 mg IV every 12 hours and continue with current dose of Tobramycin.    Artemio AlyKevin E Karaline Buresh PharmD, BCPS

## 2014-07-16 NOTE — Progress Notes (Signed)
Shift assessment complete.  Pt alert and oriented X4.  Respirations even and unlabored.  Breath sounds clear bilaterally.  HR regular.  Abdomen soft, bowel sounds active in all quadrants. Pt has scars from prior surgery to LLE, pedal palpable, sensation present.  IVF infusing without difficulty.  Bed low, call light within reach, pt instructed to call for assistance if needed.

## 2014-07-17 MED ORDER — LEVOFLOXACIN 750 MG TAB
750 mg | ORAL_TABLET | Freq: Every day | ORAL | Status: AC
Start: 2014-07-17 — End: 2014-07-27

## 2014-07-17 MED ORDER — BENZONATATE 100 MG CAP
100 mg | ORAL_CAPSULE | Freq: Three times a day (TID) | ORAL | Status: AC | PRN
Start: 2014-07-17 — End: 2014-07-24

## 2014-07-17 MED ORDER — ALBUTEROL SULFATE HFA 90 MCG/ACTUATION AEROSOL INHALER
90 mcg/actuation | RESPIRATORY_TRACT | Status: DC | PRN
Start: 2014-07-17 — End: 2015-07-25

## 2014-07-17 MED ORDER — OXYCODONE 5 MG TAB
5 mg | ORAL_TABLET | Freq: Three times a day (TID) | ORAL | Status: DC | PRN
Start: 2014-07-17 — End: 2014-07-19

## 2014-07-17 MED ORDER — GUAIFENESIN SR 600 MG TAB
600 mg | ORAL_TABLET | Freq: Two times a day (BID) | ORAL | Status: AC
Start: 2014-07-17 — End: 2014-07-24

## 2014-07-17 MED ORDER — HEPARIN, PORCINE (PF) 100 UNIT/ML IV SYRINGE
100 unit/mL | INTRAVENOUS | Status: DC | PRN
Start: 2014-07-17 — End: 2014-07-17

## 2014-07-17 MED ORDER — ENOXAPARIN 80 MG/0.8 ML SUB-Q SYRINGE
80 mg/0.8 mL | INJECTION | Freq: Two times a day (BID) | SUBCUTANEOUS | Status: DC
Start: 2014-07-17 — End: 2014-07-19

## 2014-07-17 MED FILL — OXYCODONE 5 MG TAB: 5 mg | ORAL | Qty: 2

## 2014-07-17 MED FILL — ZOLPIDEM 5 MG TAB: 5 mg | ORAL | Qty: 1

## 2014-07-17 MED FILL — VANCOMYCIN IN 0.9% SODIUM CHLORIDE 1.5 G/500 ML IV: 1.5 g/500 mL | INTRAVENOUS | Qty: 500

## 2014-07-17 MED FILL — PANTOPRAZOLE 40 MG TAB, DELAYED RELEASE: 40 mg | ORAL | Qty: 1

## 2014-07-17 MED FILL — LOVENOX 80 MG/0.8 ML SUBCUTANEOUS SYRINGE: 80 mg/0.8 mL | SUBCUTANEOUS | Qty: 0.8

## 2014-07-17 MED FILL — MONOJECT PREFILL ADVANCED (PF) 100 UNIT/ML INTRAVENOUS SYRINGE: 100 unit/mL | INTRAVENOUS | Qty: 3

## 2014-07-17 MED FILL — DULOXETINE 30 MG CAP, DELAYED RELEASE: 30 mg | ORAL | Qty: 1

## 2014-07-17 MED FILL — BUDESONIDE 0.5 MG/2 ML NEB SUSPENSION: 0.5 mg/2 mL | RESPIRATORY_TRACT | Qty: 1

## 2014-07-17 MED FILL — PROMETHAZINE 25 MG/ML INJECTION: 25 mg/mL | INTRAMUSCULAR | Qty: 1

## 2014-07-17 MED FILL — TOBRAMYCIN 40 MG/ML INJECTION: 40 mg/mL | INTRAMUSCULAR | Qty: 7

## 2014-07-17 MED FILL — CLONAZEPAM 1 MG TAB: 1 mg | ORAL | Qty: 1

## 2014-07-17 MED FILL — AZTREONAM 2 GRAM SOLUTION FOR INJECTION: 2 gram | INTRAMUSCULAR | Qty: 2

## 2014-07-17 MED FILL — GUAIFENESIN SR 600 MG TAB: 600 mg | ORAL | Qty: 1

## 2014-07-17 NOTE — Progress Notes (Signed)
Port accessed removed per protocol, pt tolerated well.

## 2014-07-17 NOTE — Progress Notes (Signed)
Resting quietly, resp even, unlab.  Eyes closed with relaxed facial expression.  No distress noted.

## 2014-07-17 NOTE — Progress Notes (Signed)
Oxycodone 10 mg given PO for c/o pain rated "7". Agrees to call for asst to be out of bed if steadiness questionable, due to side effects of meds, discussed.

## 2014-07-17 NOTE — Progress Notes (Signed)
Awoke with c/o nausea, reportedly with ineffective relief from Zofran, specifically requesting Phenergan; Phenergan 12.5 mg given IM, no emesis. Oxycodone 10 mg given PO for c/o pain, rated "7". Aware to call for asst to be out of bed if steadiness questionable.

## 2014-07-17 NOTE — Progress Notes (Signed)
Pt discharged per MD orders.  I have reviewed discharge instructions with the patient.  The patient verbalized understanding. Opportunity for questions and clarification provide. IV removed.

## 2014-07-17 NOTE — Progress Notes (Signed)
Resting quietly, resp even, unlab with no c/o. No distress. Bedside report given to oncoming nurse, Eloisa NorthernAlychia Moore, RN.

## 2014-07-17 NOTE — Progress Notes (Signed)
Assessment complete.  VSS.  Pt reports no needs at this time.  Pt able to self administer Lovenox shot.  Pt ate breakfast w/o complaint.

## 2014-07-17 NOTE — Progress Notes (Signed)
Patient not in room

## 2014-07-17 NOTE — Progress Notes (Signed)
Awoke easily with no c/o. Continues to void without difficulty, clear, yellow urine. No c/o. No distress.

## 2014-07-17 NOTE — Progress Notes (Signed)
Continues to rest quietly, resp even, unlab with O2 intact. Awoke easily. Vitals assessed with pt inquiring about pain med. Aware nurse will address.

## 2014-07-17 NOTE — Discharge Summary (Signed)
Patient ID:  Krista Lopez  [id number]  49 y.o.  1965-03-20  Admit date: 07/14/2014  1:12 AM  Discharge date and time: 07/17/2014  Attending: Luane School, MD  PCP:  Shirline Frees, MD  Treatment Team: Attending Provider: Luane School, MD    Principal Diagnosis Sepsis [facility])   Principal Problem:    Sepsis (HCC) (07/14/2014)    Active Problems:    Depression (04/26/2013)      Bipolar 2 disorder (HCC) (04/26/2013)      Factor V Leiden mutation (HCC) (04/26/2013)      Overview: Patient does not have Factor V leiden mutation. Testing at [facility] and also       here in [facility] in 2010 are negative for Factor V leiden mutation      Right upper lobe pneumonia (07/14/2014)             Hospital Course:  Please refer to the admission H&P for details of presentation. 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 Who was relatively well till presentation when she began to have cough shortness of breath  And pleuritic chest pain. Patient was found to be septic with a right upper lobe pneumonia. CT also revealed a small PE (patient was taking only 30mg  bid of lovenox). Patient was treated with broad spectrum antibiotics for 4 days, she dramatically improved with improvement seen on xray. She is saturating well, with a normal WBC. She is stable for discharge home today to follow up with her PCP in 3 days. We have increased her lovenox to 70mg  bid, for therapeutic dosing of 1mg /kg bid.     Significant Diagnostic Studies:       Labs: Results:       Chemistry Recent Labs      07/16/14   0635  07/15/14   0650   GLU  125*  129*   NA  143  143   K  4.6  4.0   CL  109*  110*   CO2  29  24   BUN  9  6   CREA  0.72  0.75   CA  8.4  8.4   AGAP  5*  9      CBC w/Diff Recent Labs      07/16/14   0635  07/15/14   0650   WBC  11.1  9.1   RBC  3.31*  3.47*   HGB  8.9*  9.2*   HCT  28.7*  29.6*   PLT  201  191   GRANS  82*  80*   LYMPH  8*  12*   EOS  0*  0*       Cardiac Enzymes No results for input(s): CPK, CKND1, MYO in the last 72 hours.    Invalid input(s): CKRMB, TROIP   Coagulation No results for input(s): PTP, INR, APTT in the last 72 hours.    Invalid input(s): INREXT    Lipid Panel No results found for: CHOL, CHOLPOCT, CHOLX, CHLST, CHOLV, [id number], HDL, LDL, NLDLCT, DLDL, LDLC, DLDLP, [id number], VLDLC, VLDL, TGL, TGLX, TRIGL, [id number], TRIGP, TGLPOCT, [id number], CHHD, CHHDX   BNP No results for input(s): BNPP in the last 72 hours.   Liver Enzymes No results for input(s): TP, ALB, TBIL, AP, SGOT, GPT in the last 72 hours.    Invalid input(s): DBIL   Thyroid Studies Lab Results   Component Value Date/Time  TSH 1.338 09/21/2009 04:15 PM          Results for orders placed or performed during the hospital encounter of 07/06/14   EKG, 12 LEAD, INITIAL   Result Value Ref Range    Systolic BP  mmHg    Diastolic BP  mmHg    Ventricular Rate 94 BPM    Atrial Rate 94 BPM    P-R Interval 164 ms    QRS Duration 80 ms    Q-T Interval 352 ms    QTC Calculation (Bezet) 440 ms    Calculated P Axis 64 degrees    Calculated R Axis 92 degrees    Calculated T Axis 51 degrees    Diagnosis       Normal sinus rhythm  Rightward axis  Nonspecific ST abnormality  Abnormal ECG  When compared with ECG of 27-Apr-2014 12:03,  No significant change was found  Confirmed by CEBE  MD (UC), JOHN E ([id number]) on 07/06/2014 7:19:26 PM       CT Results (most recent):    Results from Hospital Encounter encounter on 07/14/14   CT CHEST W CONT   Narrative CT CHEST WITH CONTRAST 07/15/2014    HISTORY: Right-sided pleuritic chest pain. MRSA bacteremia. Multiple prior  pulmonary emboli.    TECHNIQUE: The patient received 100 mL Isovue-370 nonionic IV contrast and axial  images were obtained through the chest. Coronal reformatted images were  generated.   All CT scans at this facility used dose modulation, interactive  reconstruction and/or weight based dosing when appropriate to reduce radiation   dose to as low as reasonably achievable.    COMPARISON: October 05, 2008    FINDINGS: There is a nonocclusive filling defect within a right middle lobe  pulmonary artery (series 7 image 136). A few of the right lower lobe pulmonary  arterial branches are not well evaluated because of motion artifact.    The thoracic aorta is normal in appearance.    Diffuse bilateral consolidation is present, particularly in the right upper  lobe.    The pleural spaces are clear. There is no thoracic adenopathy. A right-sided  chest port is present with catheter tip in the SVC. Included portions of the  upper abdomen demonstrate normal-appearing adrenal glands. There are no  aggressive osseous lesions.         Impression IMPRESSION:    1. Small nonocclusive right middle lobe pulmonary embolism.    2. Diffuse consolidation.    [facility]  The critical results contained in this report were communicated to the patient's  nurse, by me on 07/15/2014 at 1:40 PM after an unsuccessful attempt to reach the  covering physician at 1:25 PM.          VAS/US Results (most recent):    Results from Hospital Encounter encounter on 05/18/13   DUPLEX UPPER EXT VENOUS RIGHT   Narrative Right upper extremity venous duplex ultrasound    Indication: Right upper extremity swelling    Findings: Grayscale and color and spectral duplex imaging of the right upper  extremity venous structures was performed. The right internal jugular vein,  innominate vein, subclavian vein, axillary vein, brachial vein, and radial and  ulnar veins are patent. There is no right upper extremity venous thrombus. At  the area of swelling near the right humerus there is a 12.1 x 6.5 x 3.3 cm mixed  density structure most consistent with hematoma.         Impression Impression: Negative for right  upper extremity DVT. Large superficial hematoma.        XR Results (most recent):    Results from Hospital Encounter encounter on 07/14/14   XR CHEST PORT   Narrative EXAM:  XR CHEST PORT     INDICATION:  Pneumonia    COMPARISON:  07/13/2014    FINDINGS: A portable AP radiograph of the chest was obtained at 0629 hours. The  patient is on a cardiac monitor.  Right upper lobe pneumonia improved.  Infuse-a-Port catheter in adequate position..  The cardiac and mediastinal  contours and pulmonary vascularity are normal.  The bones and soft tissues are  grossly within normal limits.          Impression IMPRESSION: Right upper lobe pneumonia improved.          Discharge Exam:  BP 114/65 mmHg   Pulse 78   Temp(Src) 98.7 ??F (37.1 ??C)   Resp 18   Ht 5' 4.5" (1.638 m)   Wt 66.225 kg (146 lb)   BMI 24.68 kg/m2   SpO2 94%   LMP 07/28/2004  General appearance: alert, cooperative, no distress, appears stated age  Lungs: clear to auscultation bilaterally  Heart: regular rate and rhythm, S1, S2 normal, no murmur, click, rub or gallop  Abdomen: soft, non-tender. Bowel sounds normal. No masses,  no organomegaly  Extremities: no cyanosis or edema  Neurologic: Grossly normal    Disposition: home  Discharge Condition: stable  Patient Instructions:   Current Discharge Medication List      START taking these medications    Details   benzonatate (TESSALON) 100 mg capsule Take 1 Cap by mouth three (3) times daily as needed for Cough for up to 7 days.  Qty: 30 Cap, Refills: 0      enoxaparin (LOVENOX) 80 mg/0.8 mL injection 70 mg by SubCUTAneous route every twelve (12) hours every twelve (12) hours for 30 days.  Qty: 1 Syringe, Refills: 0      guaiFENesin SR (MUCINEX) 600 mg SR tablet Take 1 Tab by mouth every twelve (12) hours for 7 days.  Qty: 14 Tab, Refills: 0      oxyCODONE IR (ROXICODONE) 5 mg immediate release tablet Take 1 Tab by mouth every eight (8) hours as needed. Max Daily Amount: 15 mg.  Qty: 12 Tab, Refills: 0      levofloxacin (LEVAQUIN) 750 mg tablet Take 1 Tab by mouth daily for 10 days.  Qty: 10 Tab, Refills: 0      albuterol (PROVENTIL HFA, VENTOLIN HFA, PROAIR HFA) 90 mcg/actuation  inhaler Take 1 Puff by inhalation every four (4) hours as needed for Wheezing.  Qty: 1 Inhaler, Refills: 0         CONTINUE these medications which have NOT CHANGED    Details   cholecalciferol (VITAMIN D3) 1,000 unit cap Take  by mouth daily.      ondansetron (ZOFRAN ODT) 8 mg disintegrating tablet Take 1 Tab by mouth every twelve (12) hours as needed for Nausea.  Qty: 10 Tab, Refills: 0      pantoprazole (PROTONIX) 40 mg tablet Take 40 mg by mouth two (2) times a day.      zolpidem (AMBIEN) 10 mg tablet Take  by mouth nightly as needed for Sleep.      butalbital-acetaminophen-caffeine (FIORICET, ESGIC) 50-325-40 mg per tablet Take 1 Tab by mouth every four (4) hours as needed for Pain or Headache. Max Daily Amount: 6 Tabs.  Qty: 20 Tab, Refills: 0  SUMAtriptan (IMITREX) 100 mg tablet Take 1 Tab by mouth once as needed for Migraine.  Qty: 20 Tab, Refills: 0      DULoxetine (CYMBALTA) 60 mg capsule Take 3 caps every day.  Indications: ANXIETY WITH DEPRESSION  Qty: 90 Cap, Refills: 0    Associated Diagnoses: Bipolar 2 disorder (HCC); Depression; Anxiety      clonazePAM (KLONOPIN) 1 mg tablet Take 1 mg by mouth three (3) times daily.         STOP taking these medications       warfarin (COUMADIN) 5 mg tablet Comments:   Reason for Stopping:               Activity: Activity as tolerated  Diet: Regular Diet  Wound Care: None needed    Follow-up  ??   PCP 3 days  Time spent to discharge patient greater than 30 minutes  Signed:  Luane SchoolJohann G Milla Wahlberg, MD  07/17/2014  8:39 AM

## 2014-07-17 NOTE — Progress Notes (Signed)
[facility] [name]   [name], [address] [place], [place] [id number]  Phone: [phone]   Fax: [phone]    Cancellation Note    Pt had to cancel visit secondary to being in hospital.    Gwendolyn LimaLisa B Chet Greenley, PT MSPT

## 2014-07-18 ENCOUNTER — Inpatient Hospital Stay: Payer: MEDICARE | Primary: Student in an Organized Health Care Education/Training Program

## 2014-07-18 LAB — CULTURE, BLOOD: Culture result:: NO GROWTH

## 2014-07-18 NOTE — Progress Notes (Signed)
TOC/MSSP follow up call - no answer, left a message with my contact information.

## 2014-07-19 ENCOUNTER — Inpatient Hospital Stay: Admit: 2014-07-19 | Payer: MEDICARE | Primary: Student in an Organized Health Care Education/Training Program

## 2014-07-19 ENCOUNTER — Inpatient Hospital Stay
Admit: 2014-07-19 | Discharge: 2014-07-19 | Disposition: A | Discharge status: Home / Self Care | Payer: MEDICARE | Attending: Emergency Medicine

## 2014-07-19 ENCOUNTER — Emergency Department: Admit: 2014-07-19 | Payer: MEDICARE | Primary: Student in an Organized Health Care Education/Training Program

## 2014-07-19 DIAGNOSIS — R079 Chest pain, unspecified: Secondary | ICD-10-CM

## 2014-07-19 LAB — METABOLIC PANEL, BASIC
Anion gap: 10 mmol/L (ref 7–16)
BUN: 11 MG/DL (ref 6–23)
CO2: 26 mmol/L (ref 21–32)
Calcium: 8.9 MG/DL (ref 8.3–10.4)
Chloride: 106 mmol/L (ref 98–107)
Creatinine: 0.77 MG/DL (ref 0.6–1.0)
GFR est AA: >60 mL/min/{1.73_m2} (ref >60)
GFR est non-AA: >60 mL/min/{1.73_m2} (ref >60)
Glucose: 126 mg/dL — ABNORMAL HIGH (ref 65–100)
Potassium: 4 mmol/L (ref 3.5–5.1)
Sodium: 142 mmol/L (ref 136–145)

## 2014-07-19 LAB — CBC WITH AUTOMATED DIFF
ABS. BASOPHILS: 0 10*3/uL (ref 0.0–0.2)
ABS. EOSINOPHILS: 0.7 10*3/uL (ref 0.0–0.8)
ABS. IMM. GRANS.: 0 10*3/uL (ref 0.0–0.5)
ABS. LYMPHOCYTES: 1.6 10*3/uL (ref 0.5–4.6)
ABS. MONOCYTES: 0.6 10*3/uL (ref 0.1–1.3)
ABS. NEUTROPHILS: 4.5 10*3/uL (ref 1.7–8.2)
BASOPHILS: 0 % (ref 0.0–2.0)
EOSINOPHILS: 10 % — ABNORMAL HIGH (ref 0.5–7.8)
HCT: 30.9 % — ABNORMAL LOW (ref 35.8–46.3)
HGB: 9.7 g/dL — ABNORMAL LOW (ref 11.7–15.4)
IMMATURE GRANULOCYTES: 0.4 % (ref 0.0–5.0)
LYMPHOCYTES: 21 % (ref 13–44)
MCH: 26.9 PG (ref 26.1–32.9)
MCHC: 31.4 g/dL (ref 31.4–35.0)
MCV: 85.6 FL (ref 79.6–97.8)
MONOCYTES: 8 % (ref 4.0–12.0)
MPV: 10.5 FL — ABNORMAL LOW (ref 10.8–14.1)
NEUTROPHILS: 61 % (ref 43–78)
PLATELET: 227 10*3/uL (ref 150–450)
RBC: 3.61 M/uL — ABNORMAL LOW (ref 4.05–5.25)
RDW: 17.8 % — ABNORMAL HIGH (ref 11.9–14.6)
WBC: 7.4 10*3/uL (ref 4.3–11.1)

## 2014-07-19 LAB — POC TROPONIN: Troponin-I (POC): 0.01 ng/ml (ref 0.0–0.08)

## 2014-07-19 LAB — PROTHROMBIN TIME + INR
INR: 3.8 — CR (ref 0.9–1.2)
Prothrombin time: 40 s — ABNORMAL HIGH (ref 9.6–12.0)

## 2014-07-19 LAB — CULTURE, BLOOD: Culture result:: NO GROWTH

## 2014-07-19 MED ORDER — SODIUM CHLORIDE 0.9% BOLUS IV
0.9 % | Freq: Once | INTRAVENOUS | Status: AC
Start: 2014-07-19 — End: 2014-07-19
  Administered 2014-07-19: 20:00:00 via INTRAVENOUS

## 2014-07-19 MED ORDER — HYDROMORPHONE (PF) 1 MG/ML IJ SOLN
1 mg/mL | INTRAMUSCULAR | Status: AC
Start: 2014-07-19 — End: 2014-07-19
  Administered 2014-07-19: 20:00:00 via INTRAVENOUS

## 2014-07-19 MED ORDER — DEXAMETHASONE SODIUM PHOSPHATE 10 MG/ML IJ SOLN
10 mg/mL | INTRAMUSCULAR | Status: AC
Start: 2014-07-19 — End: 2014-07-19
  Administered 2014-07-19: 21:00:00 via INTRAVENOUS

## 2014-07-19 MED ORDER — HEPARIN, PORCINE (PF) 100 UNIT/ML IV SYRINGE
100 unit/mL | INTRAVENOUS | Status: DC | PRN
Start: 2014-07-19 — End: 2014-07-19
  Administered 2014-07-19: 23:00:00

## 2014-07-19 MED ORDER — OXYCODONE 5 MG TAB
5 mg | ORAL_TABLET | Freq: Three times a day (TID) | ORAL | Status: DC | PRN
Start: 2014-07-19 — End: 2015-02-04

## 2014-07-19 MED ORDER — HYDROMORPHONE (PF) 1 MG/ML IJ SOLN
1 mg/mL | INTRAMUSCULAR | Status: AC
Start: 2014-07-19 — End: 2014-07-19
  Administered 2014-07-19: 21:00:00 via INTRAVENOUS

## 2014-07-19 MED ORDER — SODIUM CHLORIDE 0.9 % IJ SYRG
INTRAMUSCULAR | Status: DC | PRN
Start: 2014-07-19 — End: 2014-07-19
  Administered 2014-07-19: 23:00:00 via INTRAVENOUS

## 2014-07-19 MED ORDER — SODIUM CHLORIDE 0.9 % IJ SYRG
Freq: Three times a day (TID) | INTRAMUSCULAR | Status: DC
Start: 2014-07-19 — End: 2014-07-19

## 2014-07-19 MED ORDER — HYDROMORPHONE (PF) 1 MG/ML IJ SOLN
1 mg/mL | Freq: Once | INTRAMUSCULAR | Status: AC
Start: 2014-07-19 — End: 2014-07-19
  Administered 2014-07-19: 23:00:00 via INTRAVENOUS

## 2014-07-19 MED ORDER — ONDANSETRON (PF) 4 MG/2 ML INJECTION
4 mg/2 mL | INTRAMUSCULAR | Status: AC
Start: 2014-07-19 — End: 2014-07-19
  Administered 2014-07-19: 20:00:00 via INTRAVENOUS

## 2014-07-19 MED FILL — DEXAMETHASONE SODIUM PHOSPHATE 10 MG/ML IJ SOLN: 10 mg/mL | INTRAMUSCULAR | Qty: 1

## 2014-07-19 MED FILL — HYDROMORPHONE (PF) 1 MG/ML IJ SOLN: 1 mg/mL | INTRAMUSCULAR | Qty: 1

## 2014-07-19 MED FILL — ONDANSETRON (PF) 4 MG/2 ML INJECTION: 4 mg/2 mL | INTRAMUSCULAR | Qty: 2

## 2014-07-19 MED FILL — MONOJECT PREFILL ADVANCED (PF) 100 UNIT/ML INTRAVENOUS SYRINGE: 100 unit/mL | INTRAVENOUS | Qty: 3

## 2014-07-19 NOTE — ED Provider Notes (Signed)
HPI Comments: Patient recently diagnosed with pneumonia and recurrent PE.  Was discharged on the sixth with instructions to follow-up with her doctor next week.  She is currently taking Lovenox subcutaneous shots and break starting her Coumadin, as she's had multiple PEs in the past as well.  She also suffers from a recent tib-fib fracture    Patient is a 49 y.o. female presenting with chest pain. [name]   Chest Pain (Angina)   This is a recurrent problem. The current episode started more than 2 days ago. The problem has been gradually worsening. Duration of episode(s) is 2 days. The problem occurs constantly. The pain is associated with normal activity. The pain is present in the substernal region. The pain is at a severity of 6/10. The quality of the pain is described as sharp. The pain does not radiate. The symptoms are aggravated by palpation and deep breathing. Associated symptoms include nausea and shortness of breath. Pertinent negatives include no abdominal pain, no back pain, no claudication, no cough, no diaphoresis, no dizziness, no exertional chest pressure, no fever, no headaches, no hemoptysis, no irregular heartbeat, no leg pain, no lower extremity edema, no malaise/fatigue, no near-syncope, no numbness, no orthopnea, no palpitations, no PND, no sputum production, no vomiting and no weakness. Treatments tried: pain meds. The treatment provided no relief. Risk factors include recent surgery. Her past medical history is significant for DVT and PE.Her past medical history does not include aneurysm, cancer, DM, HTN or CHF. Pertinent negatives include no cardiac catheterization.       Past Medical History:   Diagnosis Date   ??? Factor V Leiden mutation (HCC)    ??? Infectious disease 02/2009      Hx MRSA / E.Coli bacteremia, MRSA wound infection 2/2 port   ??? Factor II deficiency (HCC)    ??? Calculus of kidney    ??? Depression    ??? Iron deficiency    ??? Migraines     ??? 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 Bipolar disorder (HCC)    ??? Anxiety    ??? Restless leg syndrome    ??? Hematuria, microscopic        Past Surgical History:   Procedure Laterality Date   ??? Cystoscopy  ???     x 13   ??? Lithotripsy  2006   ??? Hx vascular access  July, 2010     [place]   ??? Hx vascular access Right 2013     pt [facility]   ??? Hx appendectomy  ???   ??? Hx tonsillectomy     ??? Hx cesarean section     ??? Hx tubal ligation     ??? Hx ovarian cyst removal       on R   ??? Hx orthopaedic       left LE         Family History:   Problem Relation Age of Onset   ??? Bleeding Prob Father      Factor V   ??? Cancer Maternal Grandmother      metastatic breast CA.   ??? Ovarian Cancer Mother [id number]     passed away due to ca   ??? Bipolar Disorder Mother    ??? Kidney Disease Brother      kidney stones  History     Social History   ??? Marital Status: DIVORCED     Spouse Name: N/A   ??? Number of Children: N/A   ??? Years of Education: N/A     Occupational History   ??? Not on file.     Social History Main Topics   ??? Smoking status: Never Smoker    ??? Smokeless tobacco: Never Used   ??? Alcohol Use: Yes      Comment: rarely - only drinks 2 times per year   ??? Drug Use: No      Comment: narcotic seeking per hospital hx.   ??? Sexual Activity:     Partners: Male     Birth Control/ Protection: Surgical     Other Topics Concern   ??? Not on file     Social History Narrative         ALLERGIES: Ativan; Codeine; Demerol; Hydrocodone-ibuprofen; Iodinated contrast media - iv dye; Ketorolac tromethamine; Motrin; Pcn; Pneumococcal vaccine; Pneumovax 23; and Shellfish containing products    Review of Systems   Constitutional: Negative for fever, malaise/fatigue and diaphoresis.   Respiratory: Positive for shortness of breath. Negative for cough, hemoptysis and sputum production.     Cardiovascular: Positive for chest pain. Negative for palpitations, orthopnea, claudication, PND and near-syncope.   Gastrointestinal: Positive for nausea. Negative for vomiting and abdominal pain.   Musculoskeletal: Negative for back pain.   Neurological: Negative for dizziness, weakness, numbness and headaches.   All other systems reviewed and are negative.      Filed Vitals:    07/19/14 1427   BP: 159/59   Pulse: 70   Temp: 97.4 ??F (36.3 ??C)   Resp: 16   Height: 5' 4.5" (1.638 m)   Weight: 66.225 kg (146 lb)   SpO2: 99%            Physical Exam   Constitutional: She is oriented to person, place, and time. She appears well-developed and well-nourished. She appears distressed.   HENT:   Head: Normocephalic and atraumatic.   Right Ear: Tympanic membrane and external ear normal.   Left Ear: Tympanic membrane and external ear normal.   Mouth/Throat: Oropharynx is clear and moist.   Eyes: Conjunctivae and EOM are normal. Pupils are equal, round, and reactive to light.   Neck: Normal range of motion. Neck supple. No tracheal deviation present.   Cardiovascular: Normal rate, regular rhythm, normal heart sounds and intact distal pulses.  Exam reveals no gallop and no friction rub.    No murmur heard.  Pulmonary/Chest: Effort normal and breath sounds normal. No respiratory distress. She has no wheezes. She exhibits tenderness.   Abdominal: Soft. Bowel sounds are normal. She exhibits no distension and no mass. There is no hepatosplenomegaly. There is no tenderness. There is no rebound and no guarding.   Musculoskeletal: Normal range of motion. She exhibits no edema.   Lymphadenopathy:     She has no cervical adenopathy.   Neurological: She is alert and oriented to person, place, and time. She displays normal reflexes. No cranial nerve deficit.   Skin: Skin is warm and dry. No rash noted. She is not diaphoretic. No erythema.   Psychiatric: Her speech is normal and behavior is normal. Her mood appears  anxious. Cognition and memory are normal.   Nursing note and vitals reviewed.       MDM  Number of Diagnoses or Management Options  Acute chest pain: established and worsening  Chest wall pain: established  and worsening     Amount and/or Complexity of Data Reviewed  Clinical lab tests: ordered and reviewed  Tests in the radiology section of CPT??: ordered and reviewed  Decide to obtain previous medical records or to obtain history from someone other than the patient: yes  Review and summarize past medical records: yes  Independent visualization of images, tracings, or specimens: yes    Risk of Complications, Morbidity, and/or Mortality  Presenting problems: high  Diagnostic procedures: low  Management options: moderate    Patient Progress  Patient progress: improved      Procedures    The patient was observed in the ED.    Results Reviewed:      Recent Results (from the past 24 hour(s))   PROTHROMBIN TIME + INR    Collection Time: 07/19/14  2:30 PM   Result Value Ref Range    Prothrombin time 40.0 (H) 9.6 - 12.0 sec    INR 3.8 (HH) 0.9 - 1.2     METABOLIC PANEL, BASIC    Collection Time: 07/19/14  2:30 PM   Result Value Ref Range    Sodium 142 136 - 145 mmol/L    Potassium 4.0 3.5 - 5.1 mmol/L    Chloride 106 98 - 107 mmol/L    CO2 26 21 - 32 mmol/L    Anion gap 10 7 - 16 mmol/L    Glucose 126 (H) 65 - 100 mg/dL    BUN 11 6 - 23 MG/DL    Creatinine 1.61 0.6 - 1.0 MG/DL    GFR est AA >09 >60 AV/WUJ/8.11B1    GFR est non-AA >60 >60 ml/min/1.27m2    Calcium 8.9 8.3 - 10.4 MG/DL   CBC WITH AUTOMATED DIFF    Collection Time: 07/19/14  2:30 PM   Result Value Ref Range    WBC 7.4 4.3 - 11.1 K/uL    RBC 3.61 (L) 4.05 - 5.25 M/uL    HGB 9.7 (L) 11.7 - 15.4 g/dL    HCT 47.8 (L) 29.5 - 46.3 %    MCV 85.6 79.6 - 97.8 FL    MCH 26.9 26.1 - 32.9 PG    MCHC 31.4 31.4 - 35.0 g/dL    RDW 62.1 (H) 30.8 - 14.6 %    PLATELET 227 150 - 450 K/uL    MPV 10.5 (L) 10.8 - 14.1 FL    DF AUTOMATED      NEUTROPHILS 61 43 - 78 %     LYMPHOCYTES 21 13 - 44 %    MONOCYTES 8 4.0 - 12.0 %    EOSINOPHILS 10 (H) 0.5 - 7.8 %    BASOPHILS 0 0.0 - 2.0 %    IMMATURE GRANULOCYTES 0.4 0.0 - 5.0 %    ABS. NEUTROPHILS 4.5 1.7 - 8.2 K/UL    ABS. LYMPHOCYTES 1.6 0.5 - 4.6 K/UL    ABS. MONOCYTES 0.6 0.1 - 1.3 K/UL    ABS. EOSINOPHILS 0.7 0.0 - 0.8 K/UL    ABS. BASOPHILS 0.0 0.0 - 0.2 K/UL    ABS. IMM. GRANS. 0.0 0.0 - 0.5 K/UL   POC TROPONIN-I    Collection Time: 07/19/14  4:06 PM   Result Value Ref Range    Troponin-I (POC) 0.01 0.0 - 0.08 ng/ml     XR CHEST PA LAT   Final Result   IMPRESSION: Resolution of right upper lobe pneumonia.             I discussed the results of  all labs, procedures, radiographs, and treatments with the patient and available family.  Treatment plan is agreed upon and the patient is ready for discharge.  All voiced understanding of the discharge plan and medication instructions or changes as appropriate.  Questions about treatment in the ED were answered.  All were encouraged to return should symptoms worsen or new problems develop.

## 2014-07-19 NOTE — Progress Notes (Signed)
[facility]rapy [facility] at [facility]   [address] [place], [place] [address]  Phone: [phone]   Fax: [phone]    Outpatient PHYSICAL THERAPY: Daily Note 07/19/2014  Fall Risk Score: 1 (? 5 = High Risk)    ICD-10: Treatment Diagnosis: Stiffness of left ankle, not elsewhere classified (M25.672), Pain in left ankle and joints of left foot (M25.572)  REFERRING PHYSICIAN: Overton Mam, MD  MD Orders: ROM Ankle, Knee, gentle strengthening, NWB  Return Physician Appointment: 06/30/14  MEDICAL/REFERRING DIAGNOSIS: s/p L ORIF tibia/fib  DATE OF ONSET: 05/31/2014  PRIOR LEVEL OF FUNCTION: no difficulty with walking, stairs, WB activities  PRECAUTIONS/ALLERGIES: pt is on blood thinner med/allergies: codeine, penacillin, toracol, IVP contrast, shell fish, pneumonia vaccine.  ASSESSMENT:  ????????This section established at most recent assessment??????????  [name] is a 49 year old female pt that is s/p L ORIF of tibia and fibula after falling at hospital.  She presents with decreased ROM, strength and swelling at ankle as expected and is to be NWB for at least 12 weeks post surgery. She has PMHx of Factor V and II which increases tendency towards blood clotting that will need to be monitored.  She normally is very active in walking, running, swimming and struggles with depression and Bipolor disorder, so will need help with alternate activities she can do in the mean time.  She normally helps maintain her house as well as cooking but is relying on help with this currently.  She is a good candidate for skilled physical therapy for rehabilitation towards improved independence with functional activities.    PROBLEM LIST (Impairments causing functional limitations):  1. Decreased Strength affecting function  2. Edema affecting function  3. Decreased ADL/Functional Activities  4. Decreased Flexibility/joint mobility  5. Increased Pain affecting function   GOALS: (Goals have been discussed and agreed upon with patient.)  SHORT-TERM FUNCTIONAL GOALS: Time Frame: 4 weeks  1.  L ankle DF > 5 degrees, PF > 40 degrees, inversion > 25 degrees AROM for improved mobility.  2.  Independent with entry level HEP.  3.  L hip, ankle and knee strength >/= 4/5 for return to WB activity  DISCHARGE GOALS: Time Frame: 8-12 weeks  1. L ankle ROM = R ankle ROM in all planes  2. Tolerates walking with lease assistive device 1 mile for community integration.  3. Good balance with single leg stance/weight shift activities.  4. Good strength with up/down 1 set of stairs.  5.  FAAM score >67 for improved overall function.  REHABILITATION POTENTIAL FOR STATED GOALS: GoodPLAN OF CARE:  INTERVENTIONS PLANNED: (Benefits and precautions of physical therapy have been discussed with the patient.)  1. Modalities PRN, including ultrasound, estim, and iontophoresis  2. Soft tissue and joint mobilization for ROM and flexibility  3. Stretching, progressive resistive exercises and HEP for return to functional activities.   4. Back Education and Training for body mechanics with activities of daily living  5. If needed, aquatic therapy.    TREATMENT PLAN EFFECTIVE DATES: 06/22/14 TO 09/22/14  FREQUENCY/DURATION: Follow patient 2 times a week for 12 weeks to address above goals.                SUBJECTIVE:  Goal= return to 90 to 95% of where she was at.  Wants to be able to walk her dog.  History of Present Injury/Illness (Reason for Referral): Pt was at hospital having 14th cystoscopy April 15th and as she turned to leave  nurse's station she states that her ankle "gave way."  She is now s/p L ORIF of lateral malleolus and distal tibia on 05/31/14.  Her orders are NWB for 12 weeks.  She states her depression in worsening because of having to stay home and not be mobile.  Present Symptoms:  Pt reports she had pneumonia and was in the hospital for 4 days. She continues to feel weak but wanted to come to therapy  today.    Dominant Side: right  Past Medical History: has a past medical history of Factor V Leiden mutation (HCC); Infectious disease (02/2009); Factor II deficiency (HCC); Calculus of kidney; Depression; Iron deficiency; Migraines; Pulmonary embolism (HCC) (Multiple episodes); Bipolar disorder (HCC); Anxiety; Restless leg syndrome; and Hematuria, microscopic.  Past Surgical History:  has past surgical history that includes cystoscopy (???); lithotripsy (2006); vascular access (July, 2010); vascular access (Right, 2013); appendectomy (???); tonsillectomy; cesarean section; tubal ligation; ovarian cyst removal; and orthopaedic. cystoscopy x 14; Green Full Filter for restraining blood clot   Current Medications: Norco, vit D3, zofran, protonix, ambien, fioricet/esgic, imitrex, cymbalta, klonopin, coumadin  Date Last Reviewed: 07/12/14  Social History/Home Situation: lives with female room mate who does most of the cooking for her and helps her with house cleaning.  Work/Activity History: on disability because of blood clot disorder/previous to this injury pt liked to run, walk, swim, hike  OBJECTIVE:  Outcome Measure:   Tool Used: PT/OT FOOT AND ANKLE ABILITY MEASURE  Score:  Initial: 21/84 Most Recent: X (Date: -- )   Interpretation of Score: For the "Activities of Daily Living", there are 21 questions each scored on a 5 point scale with 0 representing "Unable to do" and 4 representing "No difficulty".  The lower the score, the greater the functional disability. 84/84 represents no disability.  Minimal detectable change is 5.7 points.  With the addition of the 8 questions in the "Sports Subscale," there are 29 questions, each scored on a 5 point scale with 0 representing "Unable to do" and 4 representing "No difficulty".  The lower the score, the greater the functional disability. 116/116 represents no disability.  Minimal detectable change is 12.3 points.    Activities of Daily Living:   Score 84 83-68 67-51 50-34 33-18 17-1 0   Modifier CH CI CJ CK CL CM CN     ? Mobility - Walking and Moving Around:    I5038 - CURRENT STATUS: CL - 60%-79% impaired, limited or restricted   U8280 - GOAL STATUS:  CI - 1%-19% impaired, limited or restricted   K3491 - D/C STATUS:  ---------------To be determined---------------    Observation/Orthostatic Postural Assessment:  Walking:  Pt walks into clinic with 2 crutches, NWB L LE.  Minimal swelling around the L foot.   Palpation: tender to medial ankle incision   ROM:               LLE AROM  L Ankle Dorsiflexion: 2  L Ankle Plantar Flexion: 35           Strength:  At eval:  L hip abd = 4/5                   Functional Mobility: independent with compensation  Balance:  Orders are NWB L.  Pt negotiates walking with crutches well.  TREATMENT:    (In addition to Assessment/Re-Assessment sessions the following treatments were rendered)  Therapeutic Exercise: ( ): none  Manual Therapy (Left Joint Mobilization Duration  Duration: 30  Minutes    ) for ankle ROM and improved tissue mobilization. myofascial release around lower medial ankle incision with gentle scar mobilization; gentle ROM for ankle DF/PF, inversion/eversion; subtalar and midfoot joint mobilization grade 2-3; talas posterior glide grade 2  Therapeutic Modalities: not today                                                                                              HEP: continue current HEP  ______________________________________________________________________________________________________    Treatment Assessment: No change in ankle DF ROM, improved ankle PF ROM. She continues with tenderness to distal medial incision and decreased scar mobility.   Progression/Medical Necessity:   ?? Patient is expected to demonstrate progress in strength, range of motion and balance to increase independence with walking, WB activities..  Compliance with Program/Exercises:  Appears compliant and pt states she is compliant.   Reason for Continuation of Services/Other Comments:  ?? Patient continues to require skilled intervention due to needs skilled guidance for HEP, manual for ROM.Marland Kitchen  Recommendations/Intent for next treatment session: "Treatment next visit will focus on continue with ROM, gentle strengthening for ankle, quad, core- continue with progression of SLR program.    Total Treatment Duration: 30 min  PT Patient Time In/Time Out  Time In: 1025  Time Out: 1055    Litzi Binning Ekman-Suttles, PT

## 2014-07-19 NOTE — Progress Notes (Addendum)
Call to [facility] who state patient was a no show for her 5/4 appointment. Visited with patient who states she was in the hospital during that visit.  States she was not hospitalized here and that she was out of town.  States was in the hospital 4days and they 'sent her home on [employer] so she didn't have to sit on a bus for too long'.  Came to ED day after appointment on 5/5  - nurses note on that visit states  "   Patient was seen leaving via the back hallway. She was overheard saying [facility] would bring her back immediately due to her saying she had a blood clot.   ??   "

## 2014-07-19 NOTE — ED Notes (Signed)
I have reviewed discharge instructions with the patient.  The patient verbalized understanding.

## 2014-07-19 NOTE — ED Notes (Signed)
C/o CP/SOB since today. recently d/c from hospital dx with pneumonia and PE.

## 2014-07-20 LAB — EKG, 12 LEAD, INITIAL
Atrial Rate: 105 {beats}/min
Calculated P Axis: 73 degrees
Calculated R Axis: 99 degrees
Calculated T Axis: 60 degrees
P-R Interval: 144 ms
Q-T Interval: 354 ms
QRS Duration: 76 ms
QTC Calculation (Bezet): 467 ms
Ventricular Rate: 105 {beats}/min

## 2014-07-21 NOTE — Progress Notes (Signed)
MSSP follow up call to Krista Lopez, another female answered the phone and informed me she was not available at this time. I did leave a phone contact and my name for her to call me to discuss medications and most recent issues since she was just recently in ED. I will set another call in about 2 weeks.

## 2014-07-21 NOTE — Progress Notes (Signed)
Therapy Center at [facility]   [address], [address] [place], [place] [address]  Phone: [phone]   Fax: [phone]    Cancellation Note    Pt had to cancel visit secondary to being in hospital.    [name], PT MSPT

## 2014-07-22 ENCOUNTER — Inpatient Hospital Stay: Payer: MEDICARE | Primary: Student in an Organized Health Care Education/Training Program

## 2014-07-25 ENCOUNTER — Inpatient Hospital Stay: Admit: 2014-07-25 | Payer: MEDICARE | Primary: Student in an Organized Health Care Education/Training Program

## 2014-07-25 NOTE — Progress Notes (Addendum)
Therapy Center at [facility]   [address], [address] [place] [address]  Phone: [phone]   Fax: [phone]    Outpatient PHYSICAL THERAPY: Discontinuation Summary 07/25/2014  Fall Risk Score: 1 (? 5 = High Risk)    ICD-10: Treatment Diagnosis: Stiffness of left ankle, not elsewhere classified (M25.672), Pain in left ankle and joints of left foot (M25.572)  REFERRING PHYSICIAN: Trey Paula, MD  MD Orders: ROM Ankle, Knee, gentle strengthening, NWB  Return Physician Appointment: 06/30/14  MEDICAL/REFERRING DIAGNOSIS: s/p L ORIF tibia/fib  DATE OF ONSET: 05/31/2014  PRIOR LEVEL OF FUNCTION: no difficulty with walking, stairs, WB activities  PRECAUTIONS/ALLERGIES: pt is on blood thinner med/allergies: codeine, penacillin, toracol, IVP contrast, shell fish, pneumonia vaccine.  Pt started PT on 06/22/14 and attended 7 visits through 07/25/14 but did not continue after this.  She was having difficulty with her transportation service, otherwise- unknown why she did not continue. Note below based on last visit on 07/25/14.  ASSESSMENT:  ????????This section established at most recent assessment??????????  Ms. Turek is a 49 year old female pt that is s/p L ORIF of tibia and fibula after falling at hospital.  She has made good improvement in ankle ROM and is being compliant with NWB status.  She is doing well with tolerating gentle exercixes well. She has PMHx of Factor V and II which increases tendency towards blood clotting that will need to be monitored.  She normally is very active in walking, running, swimming and struggles with depression and Bipolor disorder, so will need help with alternate activities she can do in the mean time.  She normally helps maintain her house as well as cooking but is relying on help with this currently.  She is being discontinued from PT secondary to not returning after visit on 07/25/14. Unknown if any DISCHARGE goals were met.   GOALS: (Goals have been discussed and agreed upon with patient.)  SHORT-TERM FUNCTIONAL GOALS: Time Frame: 4 weeks  1.  L ankle DF > 5 degrees, PF > 40 degrees, inversion > 25 degrees AROM for improved mobility. PROGRESSED, almost met.  2.  Independent with entry level HEP. MET  3.  L hip, ankle and knee strength >/= 4/5 for return to WB activity MET  DISCHARGE GOALS: Time Frame: 8-12 weeks- UNKNOWN IF ANY DISCHARGE GOALS MET.  1. L ankle ROM = R ankle ROM in all planes  2. Tolerates walking with lease assistive device 1 mile for community integration.  3. Good balance with single leg stance/weight shift activities.  4. Good strength with up/down 1 set of stairs.  5.  FAAM score >67 for improved overall function       SUBJECTIVE:  Goal= return to 90 to 95% of where she was at.  Wants to be able to walk her dog.  History of Present Injury/Illness (Reason for Referral): Pt was at hospital having 14th cystoscopy April 15th and as she turned to leave nurse's station she states that her ankle "gave way."  She is now s/p L ORIF of lateral malleolus and distal tibia on 05/31/14.  Her orders are NWB for 12 weeks.  She states her depression in worsening because of having to stay home and not be mobile.  Present Symptoms:  At last visit on 07/25/14: Pt reports she was in hospital with double pneumonia for 4 days and then had a family emergency to tend to secondary to niece being killed by a drunk driver.  Pt  to see Dr. Judithann Sheen on Thursday. Pt reports that she has been very compliant with what Dr. Judithann Sheen has asked her to do.  Has been doing her exercises OK.  .Pain Intensity 1: 4  Dominant Side: right  Past Medical History: has a past medical history of Factor V Leiden mutation ([place]); Infectious disease (02/2009); Factor II deficiency ([place]); Calculus of kidney; Depression; Iron deficiency; Migraines; Pulmonary embolism (HCC) (Multiple episodes); Bipolar disorder ([place]); Anxiety; Restless leg syndrome; and Hematuria, microscopic.   Past Surgical History:  has a past surgical history that includes cystoscopy (???); lithotripsy (2006); vascular access (July, 2010); vascular access (Right, 2013); appendectomy (???); tonsillectomy; cesarean section; tubal ligation; ovarian cyst removal; and orthopaedic. cystoscopy x 14; Green Full Filter for restraining blood clot   Current Medications: ultram, vit D3, zofran PRN, protonix, ambien PRN, fioricet/esgic PRN, imitrex PRN, cymbalta, klonopin, coumadin  Date Last Reviewed: 07/25/14  Social History/Home Situation: lives with female room mate who does most of the cooking for her and helps her with house cleaning.  Work/Activity History: on disability because of blood clot disorder/previous to this injury pt liked to run, walk, swim, hike  OBJECTIVE:    Observation/Orthostatic Postural Assessment:  Walking:  Pt walks into clinic with 2 crutches, NWB L LE.    Palpation: tender to medial ankle incision   ROM:               LLE AROM  L Ankle Dorsiflexion: 5  L Ankle Plantar Flexion: 60  L Ankle Eversion: 12  L Ankle Inversion: 30           Strength:  Ankle isometric strength >/= 4/5 for DF, INV, EVR                 Special Tests:  Not today  Functional Mobility: independent with compensation  Balance:  Orders are NWB L.  Pt negotiates walking with crutches well.  PLAN OF CARE:Pt being discontinued from PT secondary to not returning      Thank you for this referral,     Jonetta Osgood, PT MSPT

## 2014-07-25 NOTE — Progress Notes (Signed)
Therapy Center at [facility]   [address], [address] [place], [place] [address]  Phone: [phone]   Fax: [phone]    Outpatient PHYSICAL THERAPY: MD NOTE 07/25/2014  Fall Risk Score: 1 (? 5 = High Risk)    ICD-10: Treatment Diagnosis: Stiffness of left ankle, not elsewhere classified (M25.672), Pain in left ankle and joints of left foot (M25.572)  REFERRING PHYSICIAN: Overton Mam, MD  MD Orders: ROM Ankle, Knee, gentle strengthening, NWB  ASSESSMENT:  ????????This section established at most recent assessment??????????  Krista Lopez is a 49 year old female pt that is s/p L ORIF of tibia and fibula after falling at hospital.  She has made good improvement in ankle ROM and is being compliant with NWB status.  She is doing well with tolerating gentle exercixes well. She normally is very active in walking, running, swimming and struggles with depression and Bipolor disorder, so will need help with alternate activities she can do in the mean time.     SUBJECTIVE:  Goal= return to 90 to 95% of where she was at.  Wants to be able to walk her dog.    Present Symptoms:  Pt reports she was in hospital with double pneumonia for 4 days and then had a family emergency to tend to secondary to niece being killed by a drunk driver.  Pt to see Dr. Maud Deed on Thursday. Pt reports that she has been very compliant with what Dr. Maud Deed has asked her to do.  Has been doing her exercises OK.  .Pain Intensity 1: 4  Current Medications: ultram, vit D3, zofran PRN, protonix, ambien PRN, fioricet/esgic PRN, imitrex PRN, cymbalta, klonopin, coumadin  Date Last Reviewed: 07/25/14    OBJECTIVE:    Observation/Orthostatic Postural Assessment:  Walking:  Pt walks into clinic with 2 crutches, NWB L LE.    Palpation: tender to medial ankle incision   ROM:               LLE AROM  L Ankle Dorsiflexion: 5  L Ankle Plantar Flexion: 60  L Ankle Eversion: 12  L Ankle Inversion: 30            Strength:  Ankle isometric strength >/= 4/5 for DF, INV, EVR                 Special Tests:  Not today  Functional Mobility: independent with compensation  Balance:  Orders are NWB L.  Pt negotiates walking with crutches well.  PLAN OF CARE:COntinue as per Dr. Maryruth Eve instructions.    Thank you for your referral,    [name], PT, MSPT

## 2014-07-25 NOTE — Progress Notes (Signed)
Therapy Center at [facility]   [address], [address] [place], [place] [address]  Phone: [phone]   Fax: [phone]    Outpatient PHYSICAL THERAPY: Daily Note 07/25/2014  Fall Risk Score: 1 (? 5 = High Risk)    ICD-10: Treatment Diagnosis: Stiffness of left ankle, not elsewhere classified (M25.672), Pain in left ankle and joints of left foot (M25.572)  REFERRING PHYSICIAN: Overton Mam, MD  MD Orders: ROM Ankle, Knee, gentle strengthening, NWB  Return Physician Appointment: 06/30/14  MEDICAL/REFERRING DIAGNOSIS: s/p L ORIF tibia/fib  DATE OF ONSET: 05/31/2014  PRIOR LEVEL OF FUNCTION: no difficulty with walking, stairs, WB activities  PRECAUTIONS/ALLERGIES: pt is on blood thinner med/allergies: codeine, penacillin, toracol, IVP contrast, shell fish, pneumonia vaccine.  ASSESSMENT:  ????????This section established at most recent assessment??????????  Krista Lopez is a 49 year old female pt that is s/p L ORIF of tibia and fibula after falling at hospital.  She has made good improvement in ankle ROM and is being compliant with NWB status.  She is doing well with tolerating gentle exercixes well. She has PMHx of Factor V and II which increases tendency towards blood clotting that will need to be monitored.  She normally is very active in walking, running, swimming and struggles with depression and Bipolor disorder, so will need help with alternate activities she can do in the mean time.  She normally helps maintain her house as well as cooking but is relying on help with this currently.  She is a good candidate for skilled physical therapy for rehabilitation towards improved independence with functional activities.    PROBLEM LIST (Impairments causing functional limitations):  1. Decreased Strength affecting function  2. Edema affecting function  3. Decreased ADL/Functional Activities  4. Decreased Flexibility/joint mobility  5. Increased Pain affecting function   GOALS: (Goals have been discussed and agreed upon with patient.)  SHORT-TERM FUNCTIONAL GOALS: Time Frame: 4 weeks  1.  L ankle DF > 5 degrees, PF > 40 degrees, inversion > 25 degrees AROM for improved mobility.  2.  Independent with entry level HEP.  3.  L hip, ankle and knee strength >/= 4/5 for return to WB activity  DISCHARGE GOALS: Time Frame: 8-12 weeks  1. L ankle ROM = R ankle ROM in all planes  2. Tolerates walking with lease assistive device 1 mile for community integration.  3. Good balance with single leg stance/weight shift activities.  4. Good strength with up/down 1 set of stairs.  5.  FAAM score >67 for improved overall function.  REHABILITATION POTENTIAL FOR STATED GOALS: GoodPLAN OF CARE:  INTERVENTIONS PLANNED: (Benefits and precautions of physical therapy have been discussed with the patient.)  1. Modalities PRN, including ultrasound, estim, and iontophoresis  2. Soft tissue and joint mobilization for ROM and flexibility  3. Stretching, progressive resistive exercises and HEP for return to functional activities.   4. Back Education and Training for body mechanics with activities of daily living  5. If needed, aquatic therapy.    TREATMENT PLAN EFFECTIVE DATES: 06/22/14 TO 09/22/14  FREQUENCY/DURATION: Follow patient 2 times a week for 12 weeks to address above goals.                SUBJECTIVE:  Goal= return to 90 to 95% of where she was at.  Wants to be able to walk her dog.  History of Present Injury/Illness (Reason for Referral): Pt was at hospital having 14th cystoscopy April 15th and as she turned to  leave nurse's station she states that her ankle "gave way."  She is now s/p L ORIF of lateral malleolus and distal tibia on 05/31/14.  Her orders are NWB for 12 weeks.  She states her depression in worsening because of having to stay home and not be mobile.  Present Symptoms:  Pt reports she was in hospital with double pneumonia  for 4 days and then had a family emergency to tend to secondary to niece being killed by a drunk driver.  Pt to see Dr. Maud Deed on Thursday. Pt reports that she has been very compliant with what Dr. Maud Deed has asked her to do.  Has been doing her exercises OK.  .Pain Intensity 1: 4  Dominant Side: right  Past Medical History: has a past medical history of Factor V Leiden mutation (HCC); Infectious disease (02/2009); Factor II deficiency (HCC); Calculus of kidney; Depression; Iron deficiency; Migraines; Pulmonary embolism (HCC) (Multiple episodes); Bipolar disorder (HCC); Anxiety; Restless leg syndrome; and Hematuria, microscopic.  Past Surgical History:  has past surgical history that includes cystoscopy (???); lithotripsy (2006); vascular access (July, 2010); vascular access (Right, 2013); appendectomy (???); tonsillectomy; cesarean section; tubal ligation; ovarian cyst removal; and orthopaedic. cystoscopy x 14; Green Full Filter for restraining blood clot   Current Medications: ultram, vit D3, zofran PRN, protonix, ambien PRN, fioricet/esgic PRN, imitrex PRN, cymbalta, klonopin, coumadin  Date Last Reviewed: 07/25/14  Social History/Home Situation: lives with female room mate who does most of the cooking for her and helps her with house cleaning.  Work/Activity History: on disability because of blood clot disorder/previous to this injury pt liked to run, walk, swim, hike  OBJECTIVE:  Outcome Measure:   Tool Used: PT/OT FOOT AND ANKLE ABILITY MEASURE  Score:  Initial: 21/84 Most Recent: X (Date: -- )   Interpretation of Score: For the "Activities of Daily Living", there are 21 questions each scored on a 5 point scale with 0 representing "Unable to do" and 4 representing "No difficulty".  The lower the score, the greater the functional disability. 84/84 represents no disability.  Minimal detectable change is 5.7 points.  With the addition of the 8 questions in  the "Sports Subscale," there are 29 questions, each scored on a 5 point scale with 0 representing "Unable to do" and 4 representing "No difficulty".  The lower the score, the greater the functional disability. 116/116 represents no disability.  Minimal detectable change is 12.3 points.    Activities of Daily Living:  Score 84 83-68 67-51 50-34 33-18 17-1 0   Modifier CH CI CJ CK CL CM CN     ? Mobility - Walking and Moving Around:    Z6109 - CURRENT STATUS: CL - 60%-79% impaired, limited or restricted   U0454 - GOAL STATUS:  CI - 1%-19% impaired, limited or restricted   U9811 - D/C STATUS:  ---------------To be determined---------------    Observation/Orthostatic Postural Assessment:  Walking:  Pt walks into clinic with 2 crutches, NWB L LE.    Palpation: tender to medial ankle incision   ROM:               LLE AROM  L Ankle Dorsiflexion: 5  L Ankle Plantar Flexion: 60  L Ankle Eversion: 12  L Ankle Inversion: 30           Strength:  Ankle isometric strength >/= 4/5 for DF, INV, EVR                 Special Tests:  Not today  Functional Mobility: independent with compensation  Balance:  Orders are NWB L.  Pt negotiates walking with crutches well.  TREATMENT:    (In addition to Assessment/Re-Assessment sessions the following treatments were rendered)  Therapeutic Exercise: (8 Minutes): for LE and ankle strengthening. Propripceptive Neuromuscular Facilitation for ankle D2 pattern with gentle isotonics and then gentle resistance to PF X 20-30 reps.  Manual Therapy (Left Joint Mobilization Duration  Duration: 30 Minutes    ) for ankle ROM and improved tissue mobilization. myofascial release around lower medial and lateral ankle incision with gentle scar mobilization; gentle ROM for ankle DF/PF, inversion/eversion; subtalar and midfoot joint mobilization grade 2-3; talas posterior glide grade 2-3  Therapeutic Modalities: not today                                                                                               HEP: continue current HEP including SLR program.  ______________________________________________________________________________________________________    Treatment Assessment:  Ankle ROM is progressing nicely.  Pain level remained minimal by session end.  Progression/Medical Necessity:   ?? Patient is expected to demonstrate progress in strength, range of motion and balance to increase independence with walking, WB activities..  Compliance with Program/Exercises:  Appears compliant and pt states she is compliant.  Reason for Continuation of Services/Other Comments:  ?? Patient continues to require skilled intervention due to needs skilled guidance for HEP, manual for ROM.Marland Kitchen  Recommendations/Intent for next treatment session: "Treatment next visit will focus on continue with ROM, gentle strengthening for ankle, quad, core- continue with progression of SLR program.    Total Treatment Duration: 38 min  PT Patient Time In/Time Out  Time In: 0948  Time Out: 1030    [name], PT

## 2014-07-28 ENCOUNTER — Encounter: Payer: MEDICARE | Primary: Student in an Organized Health Care Education/Training Program

## 2014-08-01 ENCOUNTER — Encounter: Payer: MEDICARE | Primary: Student in an Organized Health Care Education/Training Program

## 2014-08-02 ENCOUNTER — Encounter: Payer: MEDICARE | Primary: Student in an Organized Health Care Education/Training Program

## 2014-08-04 ENCOUNTER — Encounter: Payer: MEDICARE | Primary: Student in an Organized Health Care Education/Training Program

## 2014-08-10 NOTE — Progress Notes (Signed)
Therapy Center at [name]   [name], [address] [place], [place] [id number]  Phone: [phone]   Fax: [phone]    Cancellation NOte    Pt cancelled today's session secondary to her transportation service arrived late. She plans to still make tomorrow's appointment.    Krista LimaLisa B Leeman Johnsey, PT

## 2014-08-11 ENCOUNTER — Inpatient Hospital Stay: Payer: MEDICARE | Primary: Student in an Organized Health Care Education/Training Program

## 2014-08-11 ENCOUNTER — Inpatient Hospital Stay
Admit: 2014-08-11 | Discharge: 2014-08-12 | Disposition: A | Discharge status: Home / Self Care | Payer: MEDICARE | Attending: Emergency Medicine

## 2014-08-11 DIAGNOSIS — G8929 Other chronic pain: Secondary | ICD-10-CM

## 2014-08-11 NOTE — ED Notes (Signed)
Pt called x3 for triage.  Unable to locate the pt.

## 2014-08-11 NOTE — ED Notes (Signed)
Pt left prior to triage

## 2014-08-11 NOTE — ED Notes (Signed)
Review of patient's chart indicates that she was [facility] for the same [facility].  She had lab work and ultrasound that were negative.  Patient left before being placed in a room.

## 2014-08-11 NOTE — Progress Notes (Signed)
[name]   [name], [address] [place], [place] [id number]  Phone: [phone]   Fax: [phone]    No show NOte    Pt did not show for today's session for unknown reason.  Gwendolyn LimaLisa B Laquita Harlan, PT

## 2014-08-12 ENCOUNTER — Inpatient Hospital Stay: Payer: MEDICARE | Primary: Student in an Organized Health Care Education/Training Program

## 2014-08-12 ENCOUNTER — Emergency Department: Payer: MEDICARE | Primary: Student in an Organized Health Care Education/Training Program

## 2014-08-12 DIAGNOSIS — R1110 Vomiting, unspecified: Secondary | ICD-10-CM

## 2014-08-12 NOTE — ED Provider Notes (Signed)
HPI Comments: Patient eloped was not examined.  Emergency physician       Past Medical History:   Diagnosis Date   ??? Factor V Leiden mutation (HCC)    ??? Infectious disease 02/2009      Hx MRSA / E.Coli bacteremia, MRSA wound infection 2/2 port   ??? Factor II deficiency (HCC)    ??? Calculus of kidney    ??? Depression    ??? Iron deficiency    ??? Migraines    ??? 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 Bipolar disorder (HCC)    ??? Anxiety    ??? Restless leg syndrome    ??? Hematuria, microscopic        Past Surgical History:   Procedure Laterality Date   ??? Cystoscopy  ???     x 13   ??? Lithotripsy  2006   ??? Hx vascular access  July, 2010     [place]   ??? Hx vascular access Right 2013     pt has current rt subclavian port - [facility]   ??? Hx appendectomy  ???   ??? Hx tonsillectomy     ??? Hx cesarean section     ??? Hx tubal ligation     ??? Hx ovarian cyst removal       on R   ??? Hx orthopaedic       left LE         Family History:   Problem Relation Age of Onset   ??? Bleeding Prob Father      Factor V   ??? Cancer Maternal Grandmother      metastatic breast CA.   ??? Ovarian Cancer Mother [id number]     passed away due to ca   ??? Bipolar Disorder Mother    ??? Kidney Disease Brother      kidney stones       History     Social History   ??? Marital Status: DIVORCED     Spouse Name: N/A   ??? Number of Children: N/A   ??? Years of Education: N/A     Occupational History   ??? Not on file.     Social History Main Topics   ??? Smoking status: Never Smoker    ??? Smokeless tobacco: Never Used   ??? Alcohol Use: Yes      Comment: rarely - only drinks 2 times per year   ??? Drug Use: No      Comment: narcotic seeking per hospital hx.   ??? Sexual Activity:     Partners: Male     Birth Control/ Protection: Surgical     Other Topics Concern   ??? Not on file     Social History Narrative         ALLERGIES: Ativan; Codeine; Demerol; Hydrocodone-ibuprofen; Iodinated  contrast media - iv dye; Ketorolac tromethamine; Motrin; Pcn; Pneumococcal vaccine; Pneumovax 23; and Shellfish containing products    Review of Systems    There were no vitals filed for this visit.         Physical Exam     MDM    Procedures

## 2014-08-12 NOTE — ED Notes (Signed)
Pt c/o vomiting and abdominal pain since this am.

## 2014-08-12 NOTE — ED Notes (Signed)
Does not want to wait over an hour

## 2014-08-13 ENCOUNTER — Inpatient Hospital Stay: Admit: 2014-08-13 | Discharge: 2014-08-13 | Payer: MEDICARE | Attending: Emergency Medicine

## 2014-08-13 LAB — URINE MICROSCOPIC: Mucus: 0 /lpf

## 2014-08-13 MED ORDER — ONDANSETRON (PF) 4 MG/2 ML INJECTION
4 mg/2 mL | INTRAMUSCULAR | Status: DC
Start: 2014-08-13 — End: 2014-08-13

## 2014-08-13 MED ORDER — SODIUM CHLORIDE 0.9% BOLUS IV
0.9 % | Freq: Once | INTRAVENOUS | Status: DC
Start: 2014-08-13 — End: 2014-08-13

## 2014-08-26 ENCOUNTER — Emergency Department: Admit: 2014-08-26 | Payer: MEDICARE | Primary: Student in an Organized Health Care Education/Training Program

## 2014-08-26 ENCOUNTER — Inpatient Hospital Stay
Admit: 2014-08-26 | Discharge: 2014-08-26 | Disposition: A | Discharge status: Home / Self Care | Payer: MEDICARE | Attending: Emergency Medicine

## 2014-08-26 DIAGNOSIS — R0602 Shortness of breath: Secondary | ICD-10-CM

## 2014-08-26 MED ORDER — ONDANSETRON 4 MG TAB, RAPID DISSOLVE
4 mg | ORAL | Status: AC
Start: 2014-08-26 — End: 2014-08-26
  Administered 2014-08-26: 23:00:00 via ORAL

## 2014-08-26 MED ORDER — HYDROMORPHONE (PF) 1 MG/ML IJ SOLN
1 mg/mL | INTRAMUSCULAR | Status: AC
Start: 2014-08-26 — End: 2014-08-26
  Administered 2014-08-26: 23:00:00 via INTRAMUSCULAR

## 2014-08-26 MED FILL — HYDROMORPHONE (PF) 1 MG/ML IJ SOLN: 1 mg/mL | INTRAMUSCULAR | Qty: 1

## 2014-08-26 MED FILL — ONDANSETRON 4 MG TAB, RAPID DISSOLVE: 4 mg | ORAL | Qty: 1

## 2014-08-26 NOTE — ED Notes (Signed)
Lab states that they need to access the pts port to obtain blood

## 2014-08-26 NOTE — ED Notes (Signed)
Pt not in room and did not tell staff where she was going

## 2014-08-26 NOTE — ED Notes (Signed)
Unable to locate pt

## 2014-08-26 NOTE — ED Notes (Signed)
Lab called to draw blood on pt

## 2014-08-26 NOTE — ED Provider Notes (Signed)
HPI Comments: 49 year old lady with a history of recurrent pulmonary embolisms who presents with concerns about some chest pain and shortness of breath.  She says she has these symptoms fairly regularly.  The last months she has been seen at specialists in [place] as well as visits [facility]. She said she's had no fevers, vomiting, or diarrhea.    Elements of this note were created using speech recognition software.  As such, there may be errors of speech recognition present.    Patient is a 49 y.o. female presenting with chest pain. [name]   Chest Pain   Associated symptoms include shortness of breath. Pertinent negatives include no abdominal pain, no cough, no diaphoresis, no dizziness, no fever, no headaches, no nausea, no palpitations, no vomiting and no weakness.        Past Medical History:   Diagnosis Date   ??? Factor V Leiden mutation (HCC)    ??? Infectious disease 02/2009      Hx MRSA / E.Coli bacteremia, MRSA wound infection 2/2 port   ??? Factor II deficiency (HCC)    ??? Calculus of kidney    ??? Depression    ??? Iron deficiency    ??? Migraines    ??? 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 Bipolar disorder (HCC)    ??? Anxiety    ??? Restless leg syndrome    ??? Hematuria, microscopic        Past Surgical History:   Procedure Laterality Date   ??? Cystoscopy  ???     x 13   ??? Lithotripsy  2006   ??? Hx vascular access  July, 2010     [place]   ??? Hx vascular access Right 2013     pt has current rt [facility]clavian [facility]   ??? Hx appendectomy  ???   ??? Hx tonsillectomy     ??? Hx cesarean section     ??? Hx tubal ligation     ??? Hx ovarian cyst removal       on R   ??? Hx orthopaedic       left LE         Family History:   Problem Relation Age of Onset   ??? Bleeding Prob Father      Factor V   ??? Cancer Maternal Grandmother      metastatic breast CA.    ??? Ovarian Cancer Mother [id number]     passed away due to ca   ??? Bipolar Disorder Mother    ??? Kidney Disease Brother      kidney stones       History     Social History   ??? Marital Status: DIVORCED     Spouse Name: N/A   ??? Number of Children: N/A   ??? Years of Education: N/A     Occupational History   ??? Not on file.     Social History Main Topics   ??? Smoking status: Never Smoker    ??? Smokeless tobacco: Never Used   ??? Alcohol Use: Yes      Comment: rarely - only drinks 2 times per year   ??? Drug Use: No      Comment: narcotic seeking per hospital hx.   ??? Sexual Activity:     Partners: Male  Birth Control/ Protection: Surgical     Other Topics Concern   ??? Not on file     Social History Narrative         ALLERGIES: Ativan; Codeine; Demerol; Hydrocodone-ibuprofen; Iodinated contrast media - iv dye; Ketorolac tromethamine; Motrin; Pcn; Pneumococcal vaccine; Pneumovax 23; and Shellfish containing products    Review of Systems   Constitutional: Negative for fever, chills and diaphoresis.   HENT: Negative for congestion, rhinorrhea and sore throat.    Eyes: Negative for redness and visual disturbance.   Respiratory: Positive for shortness of breath. Negative for cough, chest tightness and wheezing.    Cardiovascular: Positive for chest pain. Negative for palpitations.   Gastrointestinal: Negative for nausea, vomiting, abdominal pain, diarrhea and blood in stool.   Endocrine: Negative for polydipsia and polyuria.   Genitourinary: Negative for dysuria and hematuria.   Musculoskeletal: Negative for myalgias, arthralgias and neck stiffness.   Skin: Negative for rash.   Allergic/Immunologic: Negative for environmental allergies and food allergies.   Neurological: Negative for dizziness, weakness and headaches.   Hematological: Negative for adenopathy. Does not bruise/bleed easily.   Psychiatric/Behavioral: Negative for confusion and sleep disturbance. The patient is not nervous/anxious.        Filed Vitals:     08/26/14 1841 08/26/14 1908   BP: 134/65    Pulse: 103    Temp: 97.9 ??F (36.6 ??C)    Resp: 16    Height:  (1.626 m)    Weight: 68.493 kg (151 lb)    SpO2: 97% 96%            Physical Exam   Constitutional: She is oriented to person, place, and time. She appears well-developed and well-nourished.   HENT:   Head: Normocephalic and atraumatic.   Eyes: Conjunctivae and EOM are normal. Pupils are equal, round, and reactive to light.   Neck: Normal range of motion.   Cardiovascular: Regular rhythm.    Mild tachycardia   Pulmonary/Chest: Effort normal and breath sounds normal. No respiratory distress. She has no wheezes. She has no rales. She exhibits no tenderness.   Abdominal: Soft. Bowel sounds are normal. There is no rebound and no guarding.   Musculoskeletal: Normal range of motion. She exhibits no edema or tenderness.   Lymphadenopathy:     She has no cervical adenopathy.   Neurological: She is alert and oriented to person, place, and time.   Skin: Skin is warm and dry.   Psychiatric: She has a normal mood and affect.   Nursing note and vitals reviewed.       MDM  Number of Diagnoses or Management Options  Diagnosis management comments: Patient left before getting the complete evaluation.  She left before we were able to discuss the findings of her x-ray or any other tests.      Procedures

## 2014-08-26 NOTE — ED Notes (Signed)
Pt taken to x ray

## 2014-09-04 NOTE — Progress Notes (Signed)
Therapy Center at [facility]   [address], [address] [place], [place] [address]  Phone: [phone]   Fax: [phone]    Outpatient PHYSICAL THERAPY: MD NOTE 08/11/2014  Fall Risk Score: 1 (? 5 = High Risk)    Patient last seen 07-25-14 for physical therapy    REFERRING PHYSICIAN: Suanne Marker, MD  MD Orders: ROM Ankle, Knee, gentle strengthening, NWB    ASSESSMENT:  Krista Lopez is a 49 year old female pt that is s/p L ORIF of tibia and fibula after falling at hospital.  She had made good improvement in ankle ROM and is being compliant with NWB status.  She is doing well with tolerating gentle exercixes well. She normally is very active in walking, running, swimming and struggles with depression and Bipolor disorder, so will need help with alternate activities she can do in the mean time.     SUBJECTIVE:  Present Symptoms: No new present symptoms. Pt last seen 07-25-14    OBJECTIVE:  Objective from 07-25-14  Observation/Orthostatic Postural Assessment:  Walking:  Pt walks into clinic with 2 crutches, NWB L LE.    Palpation: tender to medial ankle incision   ROM:      LLE AROM  L Ankle Dorsiflexion: 5  L Ankle Plantar Flexion: 60  L Ankle Eversion: 12  L Ankle Inversion: 30??              Strength:  Ankle isometric strength >/= 4/5 for DF, INV, EVR                   PLAN OF CARE:Please advise patient if she is to continue therapy    Harrell Niehoff Ekman-Suttles, PT,

## 2014-09-05 ENCOUNTER — Inpatient Hospital Stay
Admit: 2014-09-05 | Discharge: 2014-09-05 | Disposition: A | Discharge status: Home / Self Care | Payer: MEDICARE | Attending: Emergency Medicine

## 2014-09-05 DIAGNOSIS — R1013 Epigastric pain: Secondary | ICD-10-CM

## 2014-09-05 LAB — HEPATIC FUNCTION PANEL
A-G Ratio: 1.1 — ABNORMAL LOW (ref 1.2–3.5)
ALT (SGPT): 25 U/L (ref 12–65)
AST (SGOT): 21 U/L (ref 15–37)
Albumin: 3.8 g/dL (ref 3.5–5.0)
Alk. phosphatase: 127 U/L (ref 50–136)
Bilirubin, direct: 0.1 MG/DL (ref <0.4)
Bilirubin, total: 0.6 MG/DL (ref 0.2–1.1)
Globulin: 3.4 g/dL (ref 2.3–3.5)
Protein, total: 7.2 g/dL (ref 6.3–8.2)

## 2014-09-05 LAB — CBC WITH AUTOMATED DIFF
ABS. BASOPHILS: 0 10*3/uL (ref 0.0–0.2)
ABS. EOSINOPHILS: 0.6 10*3/uL (ref 0.0–0.8)
ABS. IMM. GRANS.: 0 10*3/uL (ref 0.0–0.5)
ABS. LYMPHOCYTES: 1.3 10*3/uL (ref 0.5–4.6)
ABS. MONOCYTES: 0.6 10*3/uL (ref 0.1–1.3)
ABS. NEUTROPHILS: 2.5 10*3/uL (ref 1.7–8.2)
BASOPHILS: 1 % (ref 0.0–2.0)
EOSINOPHILS: 12 % — ABNORMAL HIGH (ref 0.5–7.8)
HCT: 30.1 % — ABNORMAL LOW (ref 35.8–46.3)
HGB: 9.3 g/dL — ABNORMAL LOW (ref 11.7–15.4)
IMMATURE GRANULOCYTES: 0.4 % (ref 0.0–5.0)
LYMPHOCYTES: 26 % (ref 13–44)
MCH: 27 PG (ref 26.1–32.9)
MCHC: 30.9 g/dL — ABNORMAL LOW (ref 31.4–35.0)
MCV: 87.2 FL (ref 79.6–97.8)
MONOCYTES: 12 % (ref 4.0–12.0)
MPV: 11 FL (ref 10.8–14.1)
NEUTROPHILS: 49 % (ref 43–78)
PLATELET: 331 10*3/uL (ref 150–450)
RBC: 3.45 M/uL — ABNORMAL LOW (ref 4.05–5.25)
RDW: 17.8 % — ABNORMAL HIGH (ref 11.9–14.6)
WBC: 5.1 10*3/uL (ref 4.3–11.1)

## 2014-09-05 LAB — PROTHROMBIN TIME + INR
INR: 1.1 (ref 0.9–1.2)
Prothrombin time: 11.3 s (ref 9.6–12.0)

## 2014-09-05 LAB — METABOLIC PANEL, BASIC
Anion gap: 10 mmol/L (ref 7–16)
BUN: 9 MG/DL (ref 6–23)
CO2: 24 mmol/L (ref 21–32)
Calcium: 9.1 MG/DL (ref 8.3–10.4)
Chloride: 108 mmol/L — ABNORMAL HIGH (ref 98–107)
Creatinine: 0.82 MG/DL (ref 0.6–1.0)
GFR est AA: >60 mL/min/{1.73_m2} (ref >60)
GFR est non-AA: >60 mL/min/{1.73_m2} (ref >60)
Glucose: 98 mg/dL (ref 65–100)
Potassium: 4.1 mmol/L (ref 3.5–5.1)
Sodium: 142 mmol/L (ref 136–145)

## 2014-09-05 LAB — LIPASE: Lipase: 197 U/L (ref 73–393)

## 2014-09-05 MED ORDER — ONDANSETRON (PF) 4 MG/2 ML INJECTION
4 mg/2 mL | INTRAMUSCULAR | Status: AC
Start: 2014-09-05 — End: 2014-09-05
  Administered 2014-09-05: 19:00:00 via INTRAVENOUS

## 2014-09-05 MED ORDER — SUCRALFATE 100 MG/ML ORAL SUSP
100 mg/mL | Freq: Four times a day (QID) | ORAL | Status: DC
Start: 2014-09-05 — End: 2015-02-04

## 2014-09-05 MED ORDER — OXYCODONE-ACETAMINOPHEN 5 MG-325 MG TAB
5-325 mg | ORAL_TABLET | Freq: Four times a day (QID) | ORAL | Status: DC | PRN
Start: 2014-09-05 — End: 2014-09-05

## 2014-09-05 MED ORDER — SUCRALFATE 100 MG/ML ORAL SUSP
100 mg/mL | Freq: Four times a day (QID) | ORAL | Status: DC
Start: 2014-09-05 — End: 2014-09-05

## 2014-09-05 MED ORDER — MORPHINE 4 MG/ML SYRINGE
4 mg/mL | INTRAMUSCULAR | Status: AC
Start: 2014-09-05 — End: 2014-09-05
  Administered 2014-09-05: 19:00:00 via INTRAVENOUS

## 2014-09-05 MED ORDER — SODIUM CHLORIDE 0.9% BOLUS IV
0.9 % | Freq: Once | INTRAVENOUS | Status: AC
Start: 2014-09-05 — End: 2014-09-05
  Administered 2014-09-05: 19:00:00 via INTRAVENOUS

## 2014-09-05 MED ORDER — DIPHENHYDRAMINE HCL 50 MG/ML IJ SOLN
50 mg/mL | INTRAMUSCULAR | Status: AC
Start: 2014-09-05 — End: 2014-09-05
  Administered 2014-09-05: 19:00:00 via INTRAVENOUS

## 2014-09-05 MED FILL — DIPHENHYDRAMINE HCL 50 MG/ML IJ SOLN: 50 mg/mL | INTRAMUSCULAR | Qty: 1

## 2014-09-05 MED FILL — MORPHINE 4 MG/ML SYRINGE: 4 mg/mL | INTRAMUSCULAR | Qty: 1

## 2014-09-05 MED FILL — ONDANSETRON (PF) 4 MG/2 ML INJECTION: 4 mg/2 mL | INTRAMUSCULAR | Qty: 2

## 2014-09-05 NOTE — ED Provider Notes (Addendum)
HPI Comments: 49 year old white female with history of factor V Leiden, PE, chronic pain presents with  Right upper quadrant abdominal pain onset around 9:00 this morning.  Pain radiates into her back but not into her chest.  She has had nausea vomiting and diarrhea.  Symptoms began shortly after eating breakfast.  Patient is currently on Lovenox for PEs.  She has not noted any blood in her stool.  She has had extensive bruising which he says is related to her INR being elevated to 13 recently.  She was admitted to [facility] during that episode of coagulopathy and was switched from Coumadin to Lovenox.  She currently denies chest pain and shortness of breath.  Also denies urinary symptoms.    Patient is a 49 y.o. female presenting with abdominal pain. [name]   Abdominal Pain   Associated symptoms include diarrhea, nausea and vomiting. Pertinent negatives include no fever, no dysuria, no headaches and no chest pain.        Past Medical History:   Diagnosis Date   ??? Factor V Leiden mutation (HCC)    ??? Infectious disease 02/2009      Hx MRSA / E.Coli bacteremia, MRSA wound infection 2/2 port   ??? Factor II deficiency (HCC)    ??? Calculus of kidney    ??? Depression    ??? Iron deficiency    ??? Migraines    ??? 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 Bipolar disorder (HCC)    ??? Anxiety    ??? Restless leg syndrome    ??? Hematuria, microscopic        Past Surgical History:   Procedure Laterality Date   ??? Cystoscopy  ???     x 13   ??? Lithotripsy  2006   ??? Hx vascular access  July, 2010     Power Port placed in [place], [place]., removed   ??? Hx vascular access Right 2013     pt has current rt subclavian port - placed at [facility]   ??? Hx appendectomy  ???   ??? Hx tonsillectomy     ??? Hx cesarean section     ??? Hx tubal ligation     ??? Hx ovarian cyst removal       on R   ??? Hx orthopaedic       left LE         Family History:    Problem Relation Age of Onset   ??? Bleeding Prob Father      Factor V   ??? Cancer Maternal Grandmother      metastatic breast CA.   ??? Ovarian Cancer Mother 71     passed away due to ca   ??? Bipolar Disorder Mother    ??? Kidney Disease Brother      kidney stones       History     Social History   ??? Marital Status: DIVORCED     Spouse Name: N/A   ??? Number of Children: N/A   ??? Years of Education: N/A     Occupational History   ??? Not on file.     Social History Main Topics   ??? Smoking status: Never Smoker    ??? Smokeless tobacco: Never Used   ??? Alcohol Use: Yes      Comment: rarely - only drinks 2 times per year   ???  Drug Use: No      Comment: narcotic seeking per hospital hx.   ??? Sexual Activity:     Partners: Male     Birth Control/ Protection: Surgical     Other Topics Concern   ??? Not on file     Social History Narrative         ALLERGIES: Ativan; Codeine; Demerol; Hydrocodone-ibuprofen; Iodinated contrast media - iv dye; Ketorolac tromethamine; Motrin; Pcn; Pneumococcal vaccine; Pneumovax 23; and Shellfish containing products    Review of Systems   Constitutional: Negative for fever.   HENT: Negative for congestion.    Respiratory: Negative for cough and shortness of breath.    Cardiovascular: Negative for chest pain.   Gastrointestinal: Positive for nausea, vomiting, abdominal pain and diarrhea.   Genitourinary: Negative for dysuria.   Musculoskeletal: Negative for neck pain.   Skin: Negative for rash.   Neurological: Negative for headaches.       Filed Vitals:    09/05/14 1239   BP: 135/71   Pulse: 96   Temp: 98.3 ??F (36.8 ??C)   Resp: 16   Height:  (1.626 m)   Weight: 67.586 kg (149 lb)   SpO2: 100%            Physical Exam   Constitutional: She is oriented to person, place, and time. She appears well-developed and well-nourished. No distress.   HENT:   Head: Normocephalic and atraumatic.   Mouth/Throat: Oropharynx is clear and moist. No oropharyngeal exudate.    Eyes: Conjunctivae are normal. Pupils are equal, round, and reactive to light. No scleral icterus.   Neck: Normal range of motion. Neck supple.   Cardiovascular: Normal rate and regular rhythm.    No murmur heard.  Pulmonary/Chest: Effort normal and breath sounds normal. She has no wheezes.   Abdominal: Soft. There is no hepatosplenomegaly. There is tenderness in the right upper quadrant. There is no rigidity, no rebound, no guarding and no CVA tenderness.   Genitourinary:   Rectal exam reveals heme-negative stool   Musculoskeletal:   Left ankle is in a brace from recent fracture and surgical repair   Neurological: She is alert and oriented to person, place, and time.   Skin: Skin is warm and dry.   Large area of bruising to both humerus areas.   Psychiatric: She has a normal mood and affect.   Nursing note and vitals reviewed.       MDM  Number of Diagnoses or Management Options  Abdominal pain, epigastric:   Diagnosis management comments: Lab work is unremarkable. Patient has been treated with morphine and Zofran.  I have reviewed her previous radiographs and she had a normal gallbladder ultrasound less than 2 years ago.  She has had multiple abdominal CTs in the past.  At this time I do not feel her presentation and workup warrants further radiation.  She does report she has a history of ulcers and has been on Carafate in the past.  We will refill this at this time.  She is advised follow-up with her gastroenterologist this week. Review of the [place] date of anxious patient has received 41 prescriptions for controlled substances from 24 different providers in the past year.  I'm not comfortable prescribing any further narcotics.  She has a nonsurgical abdominal exam at this time appears safe for outpatient follow-up.       Amount and/or Complexity of Data Reviewed  Clinical lab tests: ordered and reviewed  Tests in the medicine  section of CPT??: ordered and reviewed     Risk of Complications, Morbidity, and/or Mortality  Presenting problems: moderate  Diagnostic procedures: moderate  Management options: low        Procedures

## 2014-09-05 NOTE — ED Notes (Signed)
I have reviewed discharge instructions with the patient.  The patient verbalized understanding.

## 2014-09-05 NOTE — ED Notes (Signed)
Dr Teofilo Pod informed of pt c/o continued pain

## 2014-09-05 NOTE — ED Notes (Signed)
PMD-Dr Anola GurneyWalls. Pt c/o abdominal pain, N/V/D.

## 2014-09-05 NOTE — Progress Notes (Signed)
Email to Mechele Dawley RN CM [facility]CO to inform that patient in ED for the 15th time since February.

## 2014-09-06 NOTE — Progress Notes (Signed)
[facility] outreach - no answer, left a voice message with my direct contact and asking to coordinate her an appointment with a PCP to follow up. I did not see where she has any follow up appointment currently scheduled or a PCP in our system. I will attempt another call in a few days.

## 2014-09-08 NOTE — Progress Notes (Signed)
[facility] outreach - several attempts made in the past and message was left again today. I will try again in a week.

## 2014-09-12 NOTE — Progress Notes (Signed)
Therapy Center at [facility]   [address], [address] [place], [place] [address]  Phone: [phone]   Fax: [phone]    No show NOte    Pt did not show for today's session for unknown reason.  Tyson Dense, PT

## 2014-09-13 ENCOUNTER — Inpatient Hospital Stay: Payer: MEDICARE | Primary: Student in an Organized Health Care Education/Training Program

## 2014-09-13 NOTE — Progress Notes (Signed)
[facility]rapy Center at [name]   [name], [address] [place], [place] [id number]  Phone: [phone]   Fax: [phone]    No show Note    Pt did not show for today's session for unknown reason.    Delila SpenceSarah S Angeletta Goelz, DPT

## 2014-09-14 ENCOUNTER — Inpatient Hospital Stay: Payer: MEDICARE | Primary: Student in an Organized Health Care Education/Training Program

## 2014-09-14 NOTE — Progress Notes (Signed)
[facility] outreach, made many attempts by phone and mail to assist Krista Lopez, she does not return calls or wish to be enrolled at this time.

## 2014-09-15 NOTE — Progress Notes (Signed)
Therapy Center at [facility]   [address], [address] [place], [place] [address]  Phone: [phone]   Fax: [phone]    No show Note    Pt did not show for today's session for unknown reason.  Called patient and message left to return call if would like to schedule additional therapy visits.     Tyson Dense, PT, DPT

## 2014-09-16 ENCOUNTER — Inpatient Hospital Stay: Payer: MEDICARE | Primary: Student in an Organized Health Care Education/Training Program

## 2014-10-27 ENCOUNTER — Inpatient Hospital Stay
Admit: 2014-10-27 | Discharge: 2014-10-27 | Disposition: A | Discharge status: Home / Self Care | Payer: MEDICARE | Attending: Emergency Medicine

## 2014-10-27 DIAGNOSIS — R51 Headache: Secondary | ICD-10-CM

## 2014-10-27 MED ORDER — PROMETHAZINE 25 MG/ML INJECTION
25 mg/mL | INTRAMUSCULAR | Status: AC
Start: 2014-10-27 — End: 2014-10-27
  Administered 2014-10-27: 16:00:00 via INTRAMUSCULAR

## 2014-10-27 MED ORDER — DIPHENHYDRAMINE HCL 50 MG/ML IJ SOLN
50 mg/mL | INTRAMUSCULAR | Status: AC
Start: 2014-10-27 — End: 2014-10-27
  Administered 2014-10-27: 16:00:00 via INTRAMUSCULAR

## 2014-10-27 MED ORDER — MORPHINE 4 MG/ML SYRINGE
4 mg/mL | INTRAMUSCULAR | Status: AC
Start: 2014-10-27 — End: 2014-10-27
  Administered 2014-10-27: 16:00:00 via INTRAMUSCULAR

## 2014-10-27 MED FILL — DIPHENHYDRAMINE HCL 50 MG/ML IJ SOLN: 50 mg/mL | INTRAMUSCULAR | Qty: 1

## 2014-10-27 MED FILL — PROMETHAZINE 25 MG/ML INJECTION: 25 mg/mL | INTRAMUSCULAR | Qty: 1

## 2014-10-27 MED FILL — MORPHINE 4 MG/ML SYRINGE: 4 mg/mL | INTRAMUSCULAR | Qty: 1

## 2014-10-27 NOTE — ED Notes (Signed)
PMD-Dr Esquivel. Pt c/o migraine since yesterday. Has tried her regular medications without relief.

## 2014-10-27 NOTE — ED Provider Notes (Signed)
Patient is a 49 y.o. female presenting with migraines. [name]   Migraine    This is a recurrent problem. The current episode started yesterday. The problem occurs constantly. The problem has not changed since onset.The headache is aggravated by an unknown factor. The pain is located in the left unilateral region. The quality of the pain is described as dull. The pain is at a severity of 8/10. The pain is moderate. Associated symptoms include nausea and vomiting. Pertinent negatives include no fever, no chest pressure, no orthopnea, no dizziness and no visual change. She has tried triptan therapy for the symptoms. The treatment provided no relief.        Past Medical History:   Diagnosis Date   ??? Anxiety    ??? Bipolar disorder (HCC)    ??? Calculus of kidney    ??? Depression    ??? Factor II deficiency (HCC)    ??? Factor V Leiden mutation (HCC)    ??? Hematuria, microscopic    ??? Infectious disease 02/2009      Hx MRSA / E.Coli bacteremia, MRSA wound infection 2/2 port   ??? Iron deficiency    ??? Migraines    ??? 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 Restless leg syndrome        Past Surgical History:   Procedure Laterality Date   ??? Cystoscopy  ???     x 13   ??? Lithotripsy  2006   ??? Hx vascular access  July, 2010     Power Port placed in [place], [place]., removed   ??? Hx vascular access Right 2013     pt has current rt subclavian port - placed at [facility]   ??? Hx appendectomy  ???   ??? Hx tonsillectomy     ??? Hx cesarean section     ??? Hx tubal ligation     ??? Hx ovarian cyst removal       on R   ??? Hx orthopaedic       left LE         Family History:   Problem Relation Age of Onset   ??? Bleeding Prob Father      Factor V   ??? Cancer Maternal Grandmother      metastatic breast CA.   ??? Ovarian Cancer Mother 46     passed away due to ca   ??? Bipolar Disorder Mother    ??? Kidney Disease Brother      kidney stones       Social History      Social History   ??? Marital status: DIVORCED     Spouse name: N/A   ??? Number of children: N/A   ??? Years of education: N/A     Occupational History   ??? Not on file.     Social History Main Topics   ??? Smoking status: Never Smoker   ??? Smokeless tobacco: Never Used   ??? Alcohol use Yes      Comment: rarely - only drinks 2 times per year   ??? Drug use: No      Comment: narcotic seeking per hospital hx.   ??? Sexual activity: Yes     Partners: Male     Birth control/ protection: Surgical     Other Topics Concern   ??? Not on file     Social History Narrative  ALLERGIES: Ativan [lorazepam]; Codeine; Demerol [meperidine]; Hydrocodone-ibuprofen; Iodinated contrast media - iv dye; Ketorolac tromethamine; Motrin [ibuprofen]; Pcn [penicillins]; Pneumococcal vaccine; Pneumovax 23 [pneumococcal 23-val ps vaccine]; and Shellfish containing products    Review of Systems   Constitutional: Negative for fever.   Cardiovascular: Negative for orthopnea.   Gastrointestinal: Positive for nausea and vomiting.   Neurological: Negative for dizziness.   All other systems reviewed and are negative.      Vitals:    10/27/14 1059   BP: 116/67   Pulse: 90   Resp: 16   Temp: 98.7 ??F (37.1 ??C)   SpO2: 96%   Weight: 64 kg (141 lb)   Height: 5' 4.5" (1.638 m)            Physical Exam   Constitutional: She is oriented to person, place, and time. She appears well-developed and well-nourished. No distress.   HENT:   Head: Normocephalic and atraumatic.   Right Ear: External ear normal.   Left Ear: External ear normal.   Eyes: Conjunctivae and EOM are normal. Pupils are equal, round, and reactive to light.   Neck: Normal range of motion. Neck supple. No tracheal deviation present. No thyromegaly present.   Cardiovascular: Normal rate and regular rhythm.    Pulmonary/Chest: Effort normal and breath sounds normal. No respiratory distress. She has no wheezes.   Abdominal: Soft. Bowel sounds are normal.   Musculoskeletal: She exhibits no edema.    Neurological: She is alert and oriented to person, place, and time. She has normal reflexes. No cranial nerve deficit. Coordination normal.   Skin: Skin is warm and dry.   Psychiatric: She has a normal mood and affect.   Nursing note and vitals reviewed.       MDM  Number of Diagnoses or Management Options  Diagnosis management comments: Pt given phenergan, benadryl and dilaudid for pain, pt called taxi for ride home, to use at home  meds        Amount and/or Complexity of Data Reviewed  Review and summarize past medical records: yes    Risk of Complications, Morbidity, and/or Mortality  Presenting problems: low  Diagnostic procedures: low  Management options: low    Patient Progress  Patient progress: improved    ED Course       Procedures

## 2014-10-27 NOTE — ED Notes (Signed)
I have reviewed discharge instructions with the patient.  The patient verbalized understanding.

## 2014-11-09 ENCOUNTER — Emergency Department: Admit: 2014-11-09 | Payer: MEDICARE | Primary: Student in an Organized Health Care Education/Training Program

## 2014-11-09 ENCOUNTER — Emergency Department: Payer: MEDICARE | Primary: Student in an Organized Health Care Education/Training Program

## 2014-11-09 ENCOUNTER — Inpatient Hospital Stay
Admit: 2014-11-09 | Discharge: 2014-11-09 | Disposition: A | Discharge status: Home / Self Care | Payer: MEDICARE | Attending: Emergency Medicine

## 2014-11-09 DIAGNOSIS — R0789 Other chest pain: Secondary | ICD-10-CM

## 2014-11-09 LAB — EKG, 12 LEAD, INITIAL
Atrial Rate: 87 {beats}/min
Calculated P Axis: 64 degrees
Calculated R Axis: 106 degrees
Calculated T Axis: 61 degrees
P-R Interval: 164 ms
Q-T Interval: 358 ms
QRS Duration: 86 ms
QTC Calculation (Bezet): 430 ms
Ventricular Rate: 87 {beats}/min

## 2014-11-09 LAB — METABOLIC PANEL, COMPREHENSIVE
A-G Ratio: 1.1 — ABNORMAL LOW (ref 1.2–3.5)
ALT (SGPT): 25 U/L (ref 12–65)
AST (SGOT): 26 U/L (ref 15–37)
Albumin: 3.8 g/dL (ref 3.5–5.0)
Alk. phosphatase: 147 U/L — ABNORMAL HIGH (ref 50–136)
Anion gap: 8 mmol/L (ref 7–16)
BUN: 14 MG/DL (ref 6–23)
Bilirubin, total: 0.3 MG/DL (ref 0.2–1.1)
CO2: 26 mmol/L (ref 21–32)
Calcium: 9.4 MG/DL (ref 8.3–10.4)
Chloride: 104 mmol/L (ref 98–107)
Creatinine: 0.66 MG/DL (ref 0.6–1.0)
GFR est AA: >60 mL/min/{1.73_m2} (ref >60)
GFR est non-AA: >60 mL/min/{1.73_m2} (ref >60)
Globulin: 3.6 g/dL — ABNORMAL HIGH (ref 2.3–3.5)
Glucose: 88 mg/dL (ref 65–100)
Potassium: 4.4 mmol/L (ref 3.5–5.1)
Protein, total: 7.4 g/dL (ref 6.3–8.2)
Sodium: 138 mmol/L (ref 136–145)

## 2014-11-09 LAB — CBC W/O DIFF
HCT: 38.6 % (ref 35.8–46.3)
HGB: 12.3 g/dL (ref 11.7–15.4)
MCH: 29 PG (ref 26.1–32.9)
MCHC: 31.9 g/dL (ref 31.4–35.0)
MCV: 91 FL (ref 79.6–97.8)
MPV: 11.3 FL (ref 10.8–14.1)
PLATELET: 233 10*3/uL (ref 150–450)
RBC: 4.24 M/uL (ref 4.05–5.25)
RDW: 16.6 % — ABNORMAL HIGH (ref 11.9–14.6)
WBC: 4.6 10*3/uL (ref 4.3–11.1)

## 2014-11-09 LAB — TROPONIN I: Troponin-I, Qt.: <0.04 NG/ML (ref 0.02–0.05)

## 2014-11-09 LAB — URINE MICROSCOPIC
Casts: 0 /lpf
Crystals, urine: 0 /LPF
Mucus: 0 /lpf

## 2014-11-09 MED ORDER — TECHNETIUM PENTETATE
Freq: Once | Status: AC
Start: 2014-11-09 — End: 2014-11-09
  Administered 2014-11-09: 18:00:00 via RESPIRATORY_TRACT

## 2014-11-09 MED ORDER — MORPHINE 2 MG/ML INJECTION
2 mg/mL | INTRAMUSCULAR | Status: AC
Start: 2014-11-09 — End: 2014-11-09
  Administered 2014-11-09: 18:00:00 via INTRAVENOUS

## 2014-11-09 MED ORDER — SALINE PERIPHERAL FLUSH PRN
Freq: Once | INTRAMUSCULAR | Status: AC
Start: 2014-11-09 — End: 2014-11-09
  Administered 2014-11-09: 19:00:00

## 2014-11-09 MED ORDER — ONDANSETRON (PF) 4 MG/2 ML INJECTION
4 mg/2 mL | INTRAMUSCULAR | Status: AC
Start: 2014-11-09 — End: 2014-11-09
  Administered 2014-11-09: 16:00:00 via INTRAVENOUS

## 2014-11-09 MED ORDER — DIPHENHYDRAMINE HCL 50 MG/ML IJ SOLN
50 mg/mL | INTRAMUSCULAR | Status: AC
Start: 2014-11-09 — End: 2014-11-09
  Administered 2014-11-09: 16:00:00 via INTRAVENOUS

## 2014-11-09 MED ORDER — TRIMETHOPRIM-SULFAMETHOXAZOLE 160 MG-800 MG TAB
160-800 mg | ORAL_TABLET | Freq: Two times a day (BID) | ORAL | 0 refills | Status: AC
Start: 2014-11-09 — End: 2014-11-16

## 2014-11-09 MED ORDER — SODIUM CHLORIDE 0.9 % IV
INTRAVENOUS | Status: DC
Start: 2014-11-09 — End: 2014-11-09
  Administered 2014-11-09: 16:00:00 via INTRAVENOUS

## 2014-11-09 MED ORDER — MORPHINE 2 MG/ML INJECTION
2 mg/mL | INTRAMUSCULAR | Status: AC
Start: 2014-11-09 — End: 2014-11-09
  Administered 2014-11-09: 16:00:00 via INTRAVENOUS

## 2014-11-09 MED ORDER — TECHNETIUM TO 99M ALBUMIN AGGREGATED
Freq: Once | Status: AC
Start: 2014-11-09 — End: 2014-11-09
  Administered 2014-11-09: 19:00:00 via INTRAVENOUS

## 2014-11-09 MED ORDER — METHYLPREDNISOLONE (PF) 125 MG/2 ML IJ SOLR
125 mg/2 mL | Freq: Once | INTRAMUSCULAR | Status: DC
Start: 2014-11-09 — End: 2014-11-09

## 2014-11-09 MED ORDER — HEPARIN, PORCINE (PF) 100 UNIT/ML IV SYRINGE
100 unit/mL | INTRAVENOUS | Status: DC | PRN
Start: 2014-11-09 — End: 2014-11-09
  Administered 2014-11-09: 20:00:00

## 2014-11-09 MED FILL — MORPHINE 2 MG/ML INJECTION: 2 mg/mL | INTRAMUSCULAR | Qty: 1

## 2014-11-09 MED FILL — MONOJECT PREFILL ADVANCED (PF) 100 UNIT/ML INTRAVENOUS SYRINGE: 100 unit/mL | INTRAVENOUS | Qty: 3

## 2014-11-09 MED FILL — ONDANSETRON (PF) 4 MG/2 ML INJECTION: 4 mg/2 mL | INTRAMUSCULAR | Qty: 2

## 2014-11-09 MED FILL — TECHNETIUM PENTETATE: Qty: 40.4

## 2014-11-09 MED FILL — DIPHENHYDRAMINE HCL 50 MG/ML IJ SOLN: 50 mg/mL | INTRAMUSCULAR | Qty: 1

## 2014-11-09 MED FILL — TECHNETIUM TO 99M ALBUMIN AGGREGATED: Qty: 6.3

## 2014-11-09 NOTE — ED Notes (Signed)
I have reviewed discharge instructions with the patient.  The patient verbalized understanding.

## 2014-11-09 NOTE — ED Triage Notes (Signed)
Cp since this am.

## 2014-11-09 NOTE — ED Provider Notes (Addendum)
HPI Comments: 49 y/o f with history of multiple pulmonary emboli factor V Leiden deficiency anxiety depression and restless leg syndrome and migraine headaches to emergency Department today with complaint of left-sided chest pain started at 6:00 this morning while she was eating breakfast.  Pain has not let up as sharp in nature she says that she gets more short of breath when she is walking but she short of breath at rest.  Says her heart beats a little bit fast when she is up ambulatory.  The complaints with left lower extremity pain with a knot.  She reports that she had plates and screws placed not too long ago after a fairly severe fracture.  She noted that she had a knot anteriorly in the pretibial region about 4 days ago.  Says this does not feel like a blood clot to her feels like something from her surgery.    Patient is a 49 y.o. female presenting with chest pain. [name] No language interpreter was used.   Chest Pain (Angina)    This is a new problem. The current episode started 3 to 5 hours ago. The problem has not changed since onset.The problem occurs constantly. The pain is associated with normal activity. The pain is present in the left side and substernal region. The pain is moderate. The quality of the pain is described as sharp. The pain does not radiate. Associated symptoms include exertional chest pressure, leg pain, nausea and shortness of breath. Pertinent negatives include no abdominal pain, no back pain, no claudication, no cough, no diaphoresis, no dizziness, no fever, no headaches, no hemoptysis, no irregular heartbeat, no lower extremity edema, no malaise/fatigue, no near-syncope, no numbness, no orthopnea, no palpitations, no PND, no sputum production, no vomiting and no weakness. Her past medical history is significant for DVT and PE.       Past Medical History:   Diagnosis Date   ??? Anxiety    ??? Bipolar disorder (HCC)    ??? Calculus of kidney     ??? Depression    ??? Factor II deficiency (HCC)    ??? Factor V Leiden mutation (HCC)    ??? Hematuria, microscopic    ??? Infectious disease 02/2009      Hx MRSA / E.Coli bacteremia, MRSA wound infection 2/2 port   ??? Iron deficiency    ??? Migraines    ??? 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 Restless leg syndrome        Past Surgical History:   Procedure Laterality Date   ??? Cystoscopy  ???     x 13   ??? Lithotripsy  2006   ??? Hx vascular access  July, 2010     Power Port placed in [place], [place]., removed   ??? Hx vascular access Right 2013     pt has current rt subclavian port - placed at [facility]   ??? Hx appendectomy  ???   ??? Hx tonsillectomy     ??? Hx cesarean section     ??? Hx tubal ligation     ??? Hx ovarian cyst removal       on R   ??? Hx orthopaedic       left LE         Family History:   Problem Relation Age of Onset   ??? Bleeding Prob Father      Factor  V   ??? Cancer Maternal Grandmother      metastatic breast CA.   ??? Ovarian Cancer Mother 77     passed away due to ca   ??? Bipolar Disorder Mother    ??? Kidney Disease Brother      kidney stones       Social History     Social History   ??? Marital status: DIVORCED     Spouse name: N/A   ??? Number of children: N/A   ??? Years of education: N/A     Occupational History   ??? Not on file.     Social History Main Topics   ??? Smoking status: Never Smoker   ??? Smokeless tobacco: Never Used   ??? Alcohol use Yes      Comment: rarely - only drinks 2 times per year   ??? Drug use: No      Comment: narcotic seeking per hospital hx.   ??? Sexual activity: Yes     Partners: Male     Birth control/ protection: Surgical     Other Topics Concern   ??? Not on file     Social History Narrative         ALLERGIES: Ativan [lorazepam]; Codeine; Demerol [meperidine]; Hydrocodone-ibuprofen; Iodinated contrast media - iv dye; Ketorolac tromethamine; Motrin [ibuprofen]; Pcn [penicillins]; Pneumococcal vaccine;  Pneumovax 23 [pneumococcal 23-val ps vaccine]; and Shellfish containing products    Review of Systems   Constitutional: Negative for chills, diaphoresis, fever and malaise/fatigue.   HENT: Negative for facial swelling and mouth sores.    Eyes: Negative for discharge and redness.   Respiratory: Positive for shortness of breath. Negative for cough, hemoptysis, sputum production and stridor.    Cardiovascular: Positive for chest pain and leg swelling. Negative for palpitations, orthopnea, claudication, PND and near-syncope.   Gastrointestinal: Positive for nausea. Negative for abdominal pain, diarrhea and vomiting.   Endocrine: Negative for cold intolerance and heat intolerance.   Genitourinary: Negative for difficulty urinating, dysuria and pelvic pain.   Musculoskeletal: Negative for back pain and neck pain.   Skin: Negative for color change and pallor.   Neurological: Negative for dizziness, facial asymmetry, weakness, numbness and headaches.   Psychiatric/Behavioral: Negative for confusion and decreased concentration.       Vitals:    11/09/14 1046   BP: 138/78   Pulse: 82   Resp: 16   Temp: 98.4 ??F (36.9 ??C)   SpO2: 98%   Weight: 64 kg (141 lb)   Height:  (1.626 m)            Physical Exam   Constitutional: She is oriented to person, place, and time. She appears well-developed and well-nourished. No distress.   HENT:   Head: Normocephalic and atraumatic.   Right Ear: External ear normal.   Left Ear: External ear normal.   Nose: Nose normal.   Eyes: Conjunctivae and EOM are normal. Pupils are equal, round, and reactive to light.   Neck: Normal range of motion. Neck supple.   Cardiovascular: Normal rate, regular rhythm, normal heart sounds and intact distal pulses.  Exam reveals no gallop and no friction rub.    No murmur heard.  Pulmonary/Chest: Effort normal and breath sounds normal. No respiratory distress. She has no wheezes. She has no rales. She exhibits no tenderness.    Abdominal: Soft. Bowel sounds are normal. She exhibits no distension. There is no tenderness.   Musculoskeletal: Normal range of motion. She exhibits no edema  or tenderness.   Neurological: She is alert and oriented to person, place, and time. No cranial nerve deficit. Coordination normal.   Skin: Skin is warm and dry. No rash noted.   Psychiatric: She has a normal mood and affect. Her behavior is normal. Judgment and thought content normal.   Nursing note and vitals reviewed.       MDM  Number of Diagnoses or Management Options  Diagnosis management comments: 49 year old female known to this department with multiple visits for chest pain.  Patient presents with history of multiple pulmonary emboli planning with chest pain onset 6:00 this morning.  She also has some shortness of breath.  She tells me that her physician stopped her Lovenox on Monday in order to start her on projects into days.  She is concerned regarding a new PE.  She also notes that she has left lower extremity pain after a fracture to the same.  She had pins and screws placed.  She feels like she has an abnormality related to the surgery she can feel a palpable hard area anteriorly.  Pulses are present.  We will obtain a sign blood work as well as EKG provide pain control and get a CAT scan of her chest.She says she is allergic to contrast dye but she does okay with injections of steroid and Benadryl.  3:31 PM  VQ scan neg.  Pt with uti noted.  Will dc home with abx       Amount and/or Complexity of Data Reviewed  Clinical lab tests: ordered and reviewed  Tests in the radiology section of CPT??: ordered and reviewed  Discuss the patient with other providers: yes Irving Copas  )    Risk of Complications, Morbidity, and/or Mortality  Presenting problems: minimal  Diagnostic procedures: low  Management options: low    Patient Progress  Patient progress: stable    ED Course       Procedures

## 2014-11-17 ENCOUNTER — Inpatient Hospital Stay
Admit: 2014-11-17 | Discharge: 2014-11-17 | Disposition: A | Discharge status: Home / Self Care | Payer: MEDICARE | Attending: Emergency Medicine

## 2014-11-17 DIAGNOSIS — G43809 Other migraine, not intractable, without status migrainosus: Secondary | ICD-10-CM

## 2014-11-17 MED ORDER — DIPHENHYDRAMINE HCL 50 MG/ML IJ SOLN
50 mg/mL | INTRAMUSCULAR | Status: AC
Start: 2014-11-17 — End: 2014-11-17
  Administered 2014-11-17: 13:00:00 via INTRAVENOUS

## 2014-11-17 MED ORDER — BUTALBITAL-ACETAMINOPHEN-CAFFEINE 50 MG-325 MG-40 MG TAB
50-325-40 mg | ORAL_TABLET | ORAL | 0 refills | Status: DC | PRN
Start: 2014-11-17 — End: 2014-12-26

## 2014-11-17 MED ORDER — SUMATRIPTAN 100 MG TAB
100 mg | ORAL_TABLET | Freq: Once | ORAL | 0 refills | Status: DC | PRN
Start: 2014-11-17 — End: 2015-03-21

## 2014-11-17 MED ORDER — PROMETHAZINE 25 MG/ML INJECTION
25 mg/mL | INTRAMUSCULAR | Status: AC
Start: 2014-11-17 — End: 2014-11-17
  Administered 2014-11-17: 13:00:00 via INTRAMUSCULAR

## 2014-11-17 MED ORDER — MORPHINE 4 MG/ML SYRINGE
4 mg/mL | INTRAMUSCULAR | Status: AC
Start: 2014-11-17 — End: 2014-11-17
  Administered 2014-11-17: 13:00:00 via INTRAMUSCULAR

## 2014-11-17 MED FILL — DIPHENHYDRAMINE HCL 50 MG/ML IJ SOLN: 50 mg/mL | INTRAMUSCULAR | Qty: 1

## 2014-11-17 MED FILL — MORPHINE 4 MG/ML SYRINGE: 4 mg/mL | INTRAMUSCULAR | Qty: 1

## 2014-11-17 MED FILL — PROMETHAZINE 25 MG/ML INJECTION: 25 mg/mL | INTRAMUSCULAR | Qty: 1

## 2014-11-17 NOTE — ED Notes (Signed)
C/o headache x 3 days and ran out of Imitrex yesterday. Pt states that her PCP moved to [place] and did not refer her to anyone.

## 2014-11-17 NOTE — ED Notes (Signed)
I have reviewed discharge instructions with the patient.  The patient verbalized understanding.

## 2014-11-17 NOTE — ED Notes (Signed)
Pt medicated, resting, blanket given.

## 2014-11-17 NOTE — ED Provider Notes (Signed)
HPI Comments: Patient is here with a migraine headache that started 2 days ago.  She states she's had photo and phonophobia with it.  It feels like her normal migraine.  She states she has tried Imitrex 100 mg tablets ??3 without relief over the last 2 days.  She also tried the Zofran ODT tablets but the taste made her vomit.  She states she vomited one time at 3:00 this morning.  She has not had any abdominal pain, trouble with urination or bowel movements, chest pain, shortness of breath or other symptoms.  She did take a taxi here and is going to get going home.  She has never seen a neurologist for her headaches and has had multiple CAT scans for them. She has had no difficulty with speaking, weakness of her arms or legs or other neurologic symptoms associated with this headache.  No fever. Patient was ambulatory to the room and the bathroom, speaking without difficulty and well-hydrated.    Patient is a 49 y.o. female presenting with headaches. [name]   Headache    Associated symptoms include nausea and vomiting. Pertinent negatives include no weakness and no dizziness.        Past Medical History:   Diagnosis Date   ??? Anxiety    ??? Bipolar disorder (HCC)    ??? Calculus of kidney    ??? Depression    ??? Factor II deficiency (HCC)    ??? Factor V Leiden mutation (HCC)    ??? Hematuria, microscopic    ??? Infectious disease 02/2009      Hx MRSA / E.Coli bacteremia, MRSA wound infection 2/2 port   ??? Iron deficiency    ??? Migraines    ??? 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 Restless leg syndrome        Past Surgical History:   Procedure Laterality Date   ??? Cystoscopy  ???     x 13   ??? Lithotripsy  2006   ??? Hx vascular access  July, 2010     Power Port placed in [place], [place] removed   ??? Hx vascular access Right 2013     pt has current rt subclavian port - placed at [facility]   ??? Hx appendectomy  ???   ??? Hx tonsillectomy      ??? Hx cesarean section     ??? Hx tubal ligation     ??? Hx ovarian cyst removal       on R   ??? Hx orthopaedic       left LE         Family History:   Problem Relation Age of Onset   ??? Bleeding Prob Father      Factor V   ??? Cancer Maternal Grandmother      metastatic breast CA.   ??? Ovarian Cancer Mother 75     passed away due to ca   ??? Bipolar Disorder Mother    ??? Kidney Disease Brother      kidney stones       Social History     Social History   ??? Marital status: DIVORCED     Spouse name: N/A   ??? Number of children: N/A   ??? Years of education: N/A     Occupational History   ??? Not on file.     Social History Main Topics   ???  Smoking status: Never Smoker   ??? Smokeless tobacco: Never Used   ??? Alcohol use Yes      Comment: rarely - only drinks 2 times per year   ??? Drug use: No      Comment: narcotic seeking per hospital hx.   ??? Sexual activity: Yes     Partners: Male     Birth control/ protection: Surgical     Other Topics Concern   ??? Not on file     Social History Narrative         ALLERGIES: Ativan [lorazepam]; Codeine; Demerol [meperidine]; Hydrocodone-ibuprofen; Iodinated contrast media - iv dye; Ketorolac tromethamine; Pcn [penicillins]; Pneumococcal vaccine; Pneumovax 23 [pneumococcal 23-val ps vaccine]; and Shellfish containing products    Review of Systems   Constitutional: Negative.    Eyes: Negative.    Respiratory: Negative.    Cardiovascular: Negative.    Gastrointestinal: Positive for nausea and vomiting.   Genitourinary: Negative.    Musculoskeletal: Negative.    Skin: Negative.    Neurological: Positive for headaches. Negative for dizziness, tremors, seizures, syncope, facial asymmetry, speech difficulty, weakness and numbness.   Psychiatric/Behavioral: Negative.    All other systems reviewed and are negative.      Vitals:    11/17/14 0845   BP: 130/83   Pulse: 98   Resp: 16   Temp: 98.5 ??F (36.9 ??C)   SpO2: 99%   Weight: 64 kg (141 lb)   Height:  (1.626 m)            Physical Exam    Constitutional: She is oriented to person, place, and time. She appears well-developed and well-nourished.   HENT:   Head: Normocephalic and atraumatic.   Right Ear: External ear normal.   Left Ear: External ear normal.   Nose: Nose normal.   Mouth/Throat: Oropharynx is clear and moist.   Eyes: Conjunctivae and EOM are normal. Pupils are equal, round, and reactive to light.   Neck: Normal range of motion. Neck supple.   Cardiovascular: Normal rate, regular rhythm, normal heart sounds and intact distal pulses.    Pulmonary/Chest: Effort normal and breath sounds normal.   Abdominal: Soft. Bowel sounds are normal.   Musculoskeletal: Normal range of motion.   Neurological: She is alert and oriented to person, place, and time. She has normal strength and normal reflexes. No cranial nerve deficit or sensory deficit. She displays a negative Romberg sign. GCS eye subscore is 4. GCS verbal subscore is 5. GCS motor subscore is 6.   Skin: Skin is warm and dry.   Psychiatric: She has a normal mood and affect. Her behavior is normal. Judgment and thought content normal.   Nursing note and vitals reviewed.       MDM  Number of Diagnoses or Management Options  Other migraine without status migrainosus, not intractable: minor     Amount and/or Complexity of Data Reviewed  Discuss the patient with other providers: yes (Dr. Leonel Ramsay)    Risk of Complications, Morbidity, and/or Mortality  Presenting problems: low  Diagnostic procedures: low  Management options: low    Patient Progress  Patient progress: improved    ED Course       Procedures  8:45 AM Spoke with Dr. Leonel Ramsay regarding patient.   The patient was observed in the ED.  Patient states this is her normal migraine and therefore she was treated with IM medication here today and will take a taxi ride home.  I did make a  referral to a neurologist and she would like the Fioricet to go home with today.  I can also refill her Imitrex if needed.  I will refer her to  a new primary care physician as she states hers has moved to [place] and return to the ED if any change or worse.  I discussed the results of all labs, procedures, radiographs, and treatments with the patient and available family.  Treatment plan is agreed upon and the patient is ready for discharge.  All voiced understanding of the discharge plan and medication instructions or changes as appropriate.  Questions about treatment in the ED were answered.  All were encouraged to return should symptoms worsen or new problems develop.

## 2014-11-18 ENCOUNTER — Inpatient Hospital Stay
Admit: 2014-11-18 | Discharge: 2014-11-18 | Disposition: A | Discharge status: Home / Self Care | Payer: MEDICARE | Attending: Emergency Medicine

## 2014-11-18 ENCOUNTER — Emergency Department: Admit: 2014-11-18 | Payer: MEDICARE | Primary: Student in an Organized Health Care Education/Training Program

## 2014-11-18 DIAGNOSIS — R0602 Shortness of breath: Secondary | ICD-10-CM

## 2014-11-18 LAB — CBC WITH AUTOMATED DIFF
ABS. BASOPHILS: 0 10*3/uL (ref 0.0–0.2)
ABS. EOSINOPHILS: 0.6 10*3/uL (ref 0.0–0.8)
ABS. IMM. GRANS.: 0 10*3/uL (ref 0.0–0.5)
ABS. LYMPHOCYTES: 1.5 10*3/uL (ref 0.5–4.6)
ABS. MONOCYTES: 0.6 10*3/uL (ref 0.1–1.3)
ABS. NEUTROPHILS: 2.2 10*3/uL (ref 1.7–8.2)
BASOPHILS: 1 % (ref 0.0–2.0)
EOSINOPHILS: 12 % — ABNORMAL HIGH (ref 0.5–7.8)
HCT: 38 % (ref 35.8–46.3)
HGB: 12.2 g/dL (ref 11.7–15.4)
IMMATURE GRANULOCYTES: 0 % (ref 0.0–5.0)
LYMPHOCYTES: 30 % (ref 13–44)
MCH: 28.6 PG (ref 26.1–32.9)
MCHC: 32.1 g/dL (ref 31.4–35.0)
MCV: 89 FL (ref 79.6–97.8)
MONOCYTES: 12 % (ref 4.0–12.0)
MPV: 10.8 FL (ref 10.8–14.1)
NEUTROPHILS: 45 % (ref 43–78)
PLATELET: 254 10*3/uL (ref 150–450)
RBC: 4.27 M/uL (ref 4.05–5.25)
RDW: 16.6 % — ABNORMAL HIGH (ref 11.9–14.6)
WBC: 4.9 10*3/uL (ref 4.3–11.1)

## 2014-11-18 LAB — METABOLIC PANEL, BASIC
Anion gap: 7 mmol/L (ref 7–16)
BUN: 18 MG/DL (ref 6–23)
CO2: 25 mmol/L (ref 21–32)
Calcium: 9.3 MG/DL (ref 8.3–10.4)
Chloride: 105 mmol/L (ref 98–107)
Creatinine: 0.83 MG/DL (ref 0.6–1.0)
GFR est AA: >60 mL/min/{1.73_m2} (ref >60)
GFR est non-AA: >60 mL/min/{1.73_m2} (ref >60)
Glucose: 105 mg/dL — ABNORMAL HIGH (ref 65–100)
Potassium: 4.3 mmol/L (ref 3.5–5.1)
Sodium: 137 mmol/L (ref 136–145)

## 2014-11-18 MED ORDER — HYDROMORPHONE (PF) 1 MG/ML IJ SOLN
1 mg/mL | INTRAMUSCULAR | Status: AC
Start: 2014-11-18 — End: 2014-11-18
  Administered 2014-11-18: 14:00:00 via INTRAVENOUS

## 2014-11-18 MED ORDER — HYDROMORPHONE (PF) 1 MG/ML IJ SOLN
1 mg/mL | INTRAMUSCULAR | Status: AC
Start: 2014-11-18 — End: 2014-11-18
  Administered 2014-11-18: 12:00:00 via INTRAVENOUS

## 2014-11-18 MED ORDER — PROCHLORPERAZINE EDISYLATE 5 MG/ML INJECTION
5 mg/mL | INTRAMUSCULAR | Status: AC
Start: 2014-11-18 — End: 2014-11-18
  Administered 2014-11-18: 12:00:00 via INTRAVENOUS

## 2014-11-18 MED ORDER — DIPHENHYDRAMINE HCL 50 MG/ML IJ SOLN
50 mg/mL | INTRAMUSCULAR | Status: AC
Start: 2014-11-18 — End: 2014-11-18
  Administered 2014-11-18: 12:00:00 via INTRAVENOUS

## 2014-11-18 MED ORDER — HEPARIN, PORCINE (PF) 100 UNIT/ML IV SYRINGE
100 unit/mL | INTRAVENOUS | Status: DC | PRN
Start: 2014-11-18 — End: 2014-11-18
  Administered 2014-11-18: 15:00:00

## 2014-11-18 MED FILL — HYDROMORPHONE (PF) 1 MG/ML IJ SOLN: 1 mg/mL | INTRAMUSCULAR | Qty: 1

## 2014-11-18 MED FILL — PROCHLORPERAZINE EDISYLATE 5 MG/ML INJECTION: 5 mg/mL | INTRAMUSCULAR | Qty: 2

## 2014-11-18 MED FILL — MONOJECT PREFILL ADVANCED (PF) 100 UNIT/ML INTRAVENOUS SYRINGE: 100 unit/mL | INTRAVENOUS | Qty: 3

## 2014-11-18 MED FILL — DIPHENHYDRAMINE HCL 50 MG/ML IJ SOLN: 50 mg/mL | INTRAMUSCULAR | Qty: 1

## 2014-11-18 NOTE — ED Provider Notes (Signed)
HPI Comments: Patient's 49 year old female with past history of factor V Leiden who presents with headache.  Patient seen yesterday, states headache continues, states the headache is consistent with headaches in the past (her left side, was not sudden in onset, has been gradually worsening for the past 5 days.  Denies fevers or chills, no chest pain or shortness of breath, no nausea or vomiting, no further complaints.    Patient is a 49 y.o. female presenting with headaches. [name] No language interpreter was used.   Headache    Pertinent negatives include no fever, no shortness of breath, no weakness, no nausea and no vomiting.        Past Medical History:   Diagnosis Date   ??? Anxiety    ??? Bipolar disorder (HCC)    ??? Calculus of kidney    ??? Depression    ??? Factor II deficiency (HCC)    ??? Factor V Leiden mutation (HCC)    ??? Hematuria, microscopic    ??? Infectious disease 02/2009      Hx MRSA / E.Coli bacteremia, MRSA wound infection 2/2 port   ??? Iron deficiency    ??? Migraines    ??? 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 Restless leg syndrome        Past Surgical History:   Procedure Laterality Date   ??? Cystoscopy  ???     x 13   ??? Lithotripsy  2006   ??? Hx vascular access  July, 2010     Power Port placed in [place], [place] removed   ??? Hx vascular access Right 2013     pt has current rt subclavian port - placed at [facility]   ??? Hx appendectomy  ???   ??? Hx tonsillectomy     ??? Hx cesarean section     ??? Hx tubal ligation     ??? Hx ovarian cyst removal       on R   ??? Hx orthopaedic       left LE         Family History:   Problem Relation Age of Onset   ??? Bleeding Prob Father      Factor V   ??? Cancer Maternal Grandmother      metastatic breast CA.   ??? Ovarian Cancer Mother 92     passed away due to ca   ??? Bipolar Disorder Mother    ??? Kidney Disease Brother      kidney stones       Social History     Social History    ??? Marital status: DIVORCED     Spouse name: N/A   ??? Number of children: N/A   ??? Years of education: N/A     Occupational History   ??? Not on file.     Social History Main Topics   ??? Smoking status: Never Smoker   ??? Smokeless tobacco: Never Used   ??? Alcohol use Yes      Comment: rarely - only drinks 2 times per year   ??? Drug use: No      Comment: narcotic seeking per hospital hx.   ??? Sexual activity: Yes     Partners: Male     Birth control/ protection: Surgical     Other Topics Concern   ??? Not on file     Social History Narrative  ALLERGIES: Ativan [lorazepam]; Codeine; Demerol [meperidine]; Hydrocodone-ibuprofen; Iodinated contrast media - iv dye; Ketorolac tromethamine; Pcn [penicillins]; Pneumococcal vaccine; Pneumovax 23 [pneumococcal 23-val ps vaccine]; and Shellfish containing products    Review of Systems   Constitutional: Negative for chills and fever.   HENT: Negative for rhinorrhea and sore throat.    Eyes: Negative for visual disturbance.   Respiratory: Negative for cough and shortness of breath.    Cardiovascular: Negative for chest pain and leg swelling.   Gastrointestinal: Negative for abdominal pain, diarrhea, nausea and vomiting.   Genitourinary: Negative for dysuria.   Musculoskeletal: Negative for back pain and neck pain.   Skin: Negative for rash.   Neurological: Positive for headaches. Negative for weakness.   Psychiatric/Behavioral: The patient is not nervous/anxious.        Vitals:    11/18/14 0718   BP: 125/58   Pulse: 99   Resp: 16   Temp: 97.9 ??F (36.6 ??C)   SpO2: 98%   Weight: 64 kg (141 lb)   Height: 5' 4.5" (1.638 m)            Physical Exam   Constitutional: She is oriented to person, place, and time. She appears well-developed and well-nourished.   HENT:   Head: Normocephalic.   Right Ear: External ear normal.   Left Ear: External ear normal.   Eyes: Conjunctivae and EOM are normal. Pupils are equal, round, and reactive to light.    Neck: Normal range of motion. Neck supple. No tracheal deviation present.   Cardiovascular: Normal rate, regular rhythm, normal heart sounds and intact distal pulses.    No murmur heard.  Pulmonary/Chest: Effort normal and breath sounds normal. No respiratory distress.   Abdominal: Soft. There is no tenderness.   Musculoskeletal: Normal range of motion.   Neurological: She is alert and oriented to person, place, and time. No cranial nerve deficit.   Cn 2-12 fully intact, strength and sensation 5/5 in all extremities, negative pronator drift, ambulates without difficulty, no focal deficits appreciated.     Skin: No rash noted.   Nursing note and vitals reviewed.       MDM  Number of Diagnoses or Management Options  Intractable headache, unspecified chronicity pattern, unspecified headache type: new and requires workup     Amount and/or Complexity of Data Reviewed  Clinical lab tests: ordered and reviewed  Tests in the radiology section of CPT??: ordered and reviewed  Review and summarize past medical records: yes  Discuss the patient with other providers: yes    Risk of Complications, Morbidity, and/or Mortality  Presenting problems: high  Diagnostic procedures: high  Management options: high    Patient Progress  Patient progress: stable    ED Course       Procedures  Recent Results (from the past 12 hour(s))   CBC WITH AUTOMATED DIFF    Collection Time: 11/18/14  7:50 AM   Result Value Ref Range    WBC 4.9 4.3 - 11.1 K/uL    RBC 4.27 4.05 - 5.25 M/uL    HGB 12.2 11.7 - 15.4 g/dL    HCT 29.5 62.1 - 30.8 %    MCV 89.0 79.6 - 97.8 FL    MCH 28.6 26.1 - 32.9 PG    MCHC 32.1 31.4 - 35.0 g/dL    RDW 65.7 (H) 84.6 - 14.6 %    PLATELET 254 150 - 450 K/uL    MPV 10.8 10.8 - 14.1 FL    DF AUTOMATED  NEUTROPHILS 45 43 - 78 %    LYMPHOCYTES 30 13 - 44 %    MONOCYTES 12 4.0 - 12.0 %    EOSINOPHILS 12 (H) 0.5 - 7.8 %    BASOPHILS 1 0.0 - 2.0 %    IMMATURE GRANULOCYTES 0.0 0.0 - 5.0 %    ABS. NEUTROPHILS 2.2 1.7 - 8.2 K/UL     ABS. LYMPHOCYTES 1.5 0.5 - 4.6 K/UL    ABS. MONOCYTES 0.6 0.1 - 1.3 K/UL    ABS. EOSINOPHILS 0.6 0.0 - 0.8 K/UL    ABS. BASOPHILS 0.0 0.0 - 0.2 K/UL    ABS. IMM. GRANS. 0.0 0.0 - 0.5 K/UL   METABOLIC PANEL, BASIC    Collection Time: 11/18/14  7:50 AM   Result Value Ref Range    Sodium 137 136 - 145 mmol/L    Potassium 4.3 3.5 - 5.1 mmol/L    Chloride 105 98 - 107 mmol/L    CO2 25 21 - 32 mmol/L    Anion gap 7 7 - 16 mmol/L    Glucose 105 (H) 65 - 100 mg/dL    BUN 18 6 - 23 MG/DL    Creatinine 1.61 0.6 - 1.0 MG/DL    GFR est AA >09 >60 AV/WUJ/8.11B1    GFR est non-AA >60 >60 ml/min/1.54m2    Calcium 9.3 8.3 - 10.4 MG/DL     Xr Chest Pa Lat    Result Date: 11/09/2014  Chest PA and lateral views  INDICATION: Suspected pulmonary embolus.  COMPARISON: 08/26/2014  FINDINGS: The lungs are clear and normally expanded with a stable right chest Port-A-Cath. Cardiomediastinal silhouette is unchanged. No pleural effusion or pneumothorax.     IMPRESSION: No acute cardiopulmonary process.     Xr Tib/fib Lt    Result Date: 11/09/2014  Left tibia and fibula series  INDICATION: Pain and soreness  FINDINGS: Comparison is made to 06/26/2014 exam. 4 images of the left tibia and fibula show cortical plate and screw fixation at the mid and distal tibia and fibula. There is irregular sclerosis at the distal tibial shaft with partial healing of the previously seen distal tibial fractures. Fracture line remains evident. Anterior distal tibial periosteal reaction may be palpable. No acute fractures or dislocations.     IMPRESSION: Postoperative and posttraumatic changes at the tibia and fibula.     Mra Brain Wo Cont    Result Date: 11/18/2014  History: Migraine headache  FINDINGS:  Magnetic resonance angiography was performed of the head without gadolinium.. Three-dimensional MRA reconstruction and reformat was performed with the venous phase.  The dural venous sinuses are patent. Asymmetric flow signal in the internal  jugular veins bilaterally.     IMPRESSION:  Patent dural venous sinuses. If symptoms persist, MRI of the brain should be considered.     Nm Lung Perfusion W Vent    Result Date: 11/09/2014  EXAM:  NM LUNG PERFUSION W VENT  INDICATION:  Chest pain  COMPARISON:  Today's chest x-ray.  TRACER:  40 mCi technetium 73m labeled DTPA 6 mCi of Tc-33m MAA.  FINDINGS:  There is no large wedge-shaped perfusion defects or ventilation perfusion mismatch. Some linear perfusion defects are present in both lungs, these are nonsegmental. The ventilation images are normal. The GI tract activity seen on the perfusion images may be swallowed activity.     IMPRESSION: Very low probability for pulmonary embolus.       49 yo female with headache:  Patient very well-appearing this time, neurologically fully intact, I did perform a MR venogram given her history of factor V Leiden and the headache had not resolved, however after second round of Dilaudid  Patient smiling, no acute distress, states "thank you, the headache is gone" with a smile on her face. I encouraged her again that she is a follow-up with  Neurology for further evaluation of these recurrent headaches, have very low suspicion this time for any further acute process including infection or bleed given labs and exam.  Patient to return with worsening headache, any nausea or vomiting, fevers or chills, chest pain, shortness of breath, or any further concerns.

## 2014-11-18 NOTE — ED Triage Notes (Signed)
Pt seen here yesterday. Pt returns with continued headache.

## 2014-11-18 NOTE — ED Notes (Signed)
Pt requested pain medication. Provider informed.

## 2014-11-18 NOTE — ED Notes (Signed)
I have reviewed discharge instructions with the patient.  The patient verbalized understanding. Pt ambulatory to car. Pt home with family.

## 2014-11-20 NOTE — Progress Notes (Signed)
This patient is ineligible for [facility] as she has a new provider with [employer].          Alcide Goodness, LPN  Care Coordinator  [facility]   [address], [address]   [place], [place]   [address]  Mobile: [phone]  [employer] ACO Public Site   [place] ACO Team Site   [employer]

## 2014-11-30 ENCOUNTER — Inpatient Hospital Stay
Admit: 2014-11-30 | Discharge: 2014-11-30 | Disposition: A | Discharge status: Home / Self Care | Payer: MEDICARE | Attending: Emergency Medicine

## 2014-11-30 DIAGNOSIS — G43909 Migraine, unspecified, not intractable, without status migrainosus: Secondary | ICD-10-CM

## 2014-11-30 MED ORDER — TRAMADOL 50 MG TAB
50 mg | ORAL | Status: AC
Start: 2014-11-30 — End: 2014-11-30
  Administered 2014-11-30: 06:00:00 via ORAL

## 2014-11-30 MED ORDER — SODIUM CHLORIDE 0.9% BOLUS IV
0.9 % | Freq: Once | INTRAVENOUS | Status: AC
Start: 2014-11-30 — End: 2014-11-30
  Administered 2014-11-30: 06:00:00 via INTRAVENOUS

## 2014-11-30 MED ORDER — METOCLOPRAMIDE 5 MG/ML IJ SOLN
5 mg/mL | INTRAMUSCULAR | Status: AC
Start: 2014-11-30 — End: 2014-11-30
  Administered 2014-11-30: 06:00:00 via INTRAVENOUS

## 2014-11-30 MED ORDER — DIPHENHYDRAMINE HCL 50 MG/ML IJ SOLN
50 mg/mL | INTRAMUSCULAR | Status: AC
Start: 2014-11-30 — End: 2014-11-30
  Administered 2014-11-30: 06:00:00 via INTRAVENOUS

## 2014-11-30 MED ORDER — HEPARIN, PORCINE (PF) 100 UNIT/ML IV SYRINGE
100 unit/mL | INTRAVENOUS | Status: AC
Start: 2014-11-30 — End: 2014-11-30
  Administered 2014-11-30: 08:00:00

## 2014-11-30 MED ORDER — PROMETHAZINE 25 MG/ML INJECTION
25 mg/mL | INTRAMUSCULAR | Status: AC
Start: 2014-11-30 — End: 2014-11-30
  Administered 2014-11-30: 06:00:00 via INTRAMUSCULAR

## 2014-11-30 MED ORDER — METOCLOPRAMIDE 5 MG/ML IJ SOLN
5 mg/mL | INTRAMUSCULAR | Status: AC
Start: 2014-11-30 — End: 2014-11-30

## 2014-11-30 MED ORDER — DIPHENHYDRAMINE HCL 50 MG/ML IJ SOLN
50 mg/mL | INTRAMUSCULAR | Status: AC
Start: 2014-11-30 — End: 2014-11-30

## 2014-11-30 MED FILL — MONOJECT PREFILL ADVANCED (PF) 100 UNIT/ML INTRAVENOUS SYRINGE: 100 unit/mL | INTRAVENOUS | Qty: 3

## 2014-11-30 MED FILL — PROMETHAZINE 25 MG/ML INJECTION: 25 mg/mL | INTRAMUSCULAR | Qty: 1

## 2014-11-30 MED FILL — DIPHENHYDRAMINE HCL 50 MG/ML IJ SOLN: 50 mg/mL | INTRAMUSCULAR | Qty: 1

## 2014-11-30 MED FILL — METOCLOPRAMIDE 5 MG/ML IJ SOLN: 5 mg/mL | INTRAMUSCULAR | Qty: 2

## 2014-11-30 MED FILL — TRAMADOL 50 MG TAB: 50 mg | ORAL | Qty: 1

## 2014-11-30 NOTE — ED Provider Notes (Addendum)
HPI Comments: 49 year old lady with a history of migraines who presents with concerns about a migraine headache.  Patient says that she took her Imitrex and it did not help.  She also notes that she took her usual dose of Fioricet that did not help.  She denies any weakness, numbness, or tingling.  Patient says that she has had no fevers or vomiting.    Elements of this note were made using speech recognition software.  As such, errors of speech recognition may occur.    Patient is a 49 y.o. female presenting with migraines. [name]   Migraine    Pertinent negatives include no fever, no palpitations, no shortness of breath, no weakness, no dizziness, no nausea and no vomiting.        Past Medical History:   Diagnosis Date   ??? Anxiety    ??? Bipolar disorder (HCC)    ??? Calculus of kidney    ??? Depression    ??? Factor II deficiency (HCC)    ??? Factor V Leiden mutation (HCC)    ??? Hematuria, microscopic    ??? Infectious disease 02/2009      Hx MRSA / E.Coli bacteremia, MRSA wound infection 2/2 port   ??? Iron deficiency    ??? Migraines    ??? 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 Restless leg syndrome        Past Surgical History:   Procedure Laterality Date   ??? Cystoscopy  ???     x 13   ??? Lithotripsy  2006   ??? Hx vascular access  July, 2010     Power Port placed in [place], [place] removed   ??? Hx vascular access Right 2013     pt has current rt subclavian port - placed at [facility]   ??? Hx appendectomy  ???   ??? Hx tonsillectomy     ??? Hx cesarean section     ??? Hx tubal ligation     ??? Hx ovarian cyst removal       on R   ??? Hx orthopaedic       left LE         Family History:   Problem Relation Age of Onset   ??? Bleeding Prob Father      Factor V   ??? Ovarian Cancer Mother 35     passed away due to ca   ??? Bipolar Disorder Mother    ??? Kidney Disease Brother      kidney stones   ??? Cancer Maternal Grandmother      metastatic breast CA.        Social History     Social History   ??? Marital status: DIVORCED     Spouse name: N/A   ??? Number of children: N/A   ??? Years of education: N/A     Occupational History   ??? Not on file.     Social History Main Topics   ??? Smoking status: Never Smoker   ??? Smokeless tobacco: Never Used   ??? Alcohol use Yes      Comment: rarely - only drinks 2 times per year   ??? Drug use: No      Comment: narcotic seeking per hospital hx.   ??? Sexual activity: Yes     Partners: Male     Birth control/ protection: Surgical  Other Topics Concern   ??? Not on file     Social History Narrative         ALLERGIES: Ativan [lorazepam]; Codeine; Demerol [meperidine]; Hydrocodone-ibuprofen; Iodinated contrast media - iv dye; Ketorolac tromethamine; Pcn [penicillins]; Pneumococcal vaccine; Pneumovax 23 [pneumococcal 23-val ps vaccine]; and Shellfish containing products    Review of Systems   Constitutional: Negative for chills, diaphoresis and fever.   HENT: Negative for congestion, rhinorrhea and sore throat.    Eyes: Negative for redness and visual disturbance.   Respiratory: Negative for cough, chest tightness, shortness of breath and wheezing.    Cardiovascular: Negative for chest pain and palpitations.   Gastrointestinal: Negative for abdominal pain, blood in stool, diarrhea, nausea and vomiting.   Endocrine: Negative for polydipsia and polyuria.   Genitourinary: Negative for dysuria and hematuria.   Musculoskeletal: Negative for arthralgias, myalgias and neck stiffness.   Skin: Negative for rash.   Allergic/Immunologic: Negative for environmental allergies and food allergies.   Neurological: Positive for headaches. Negative for dizziness and weakness.   Hematological: Negative for adenopathy. Does not bruise/bleed easily.   Psychiatric/Behavioral: Negative for confusion and sleep disturbance. The patient is not nervous/anxious.        Vitals:    11/30/14 0116   BP: (!) 171/91   Pulse: 99   Resp: 18   SpO2: 94%   Weight: 64 kg (141 lb)    Height: 5' 4.5" (1.638 m)            Physical Exam   Constitutional: She is oriented to person, place, and time. She appears well-developed and well-nourished.   HENT:   Head: Normocephalic and atraumatic.   Eyes: Conjunctivae and EOM are normal. Pupils are equal, round, and reactive to light.   Neck: Normal range of motion.   Cardiovascular: Normal rate and regular rhythm.    Pulmonary/Chest: Effort normal and breath sounds normal. No respiratory distress. She has no wheezes. She has no rales. She exhibits no tenderness.   Musculoskeletal: Normal range of motion. She exhibits no edema or tenderness.   Lymphadenopathy:     She has no cervical adenopathy.   Neurological: She is alert and oriented to person, place, and time.   Skin: Skin is warm and dry.   Psychiatric: She has a normal mood and affect.   Nursing note and vitals reviewed.       MDM  Number of Diagnoses or Management Options  Diagnosis management comments: ppatient recently had an MRA.  Additionally, even though IV contrast is listed as one of her allergies [facility] and earlier this year in July  She had CTAs of her chest that showed no acute PEs.    Patient says she is feeling better and is ready to go home.    ED Course       Procedures

## 2014-11-30 NOTE — ED Notes (Signed)
Headache x2 weeks with nausea. Hx of the same

## 2014-11-30 NOTE — ED Notes (Signed)
Huber needle d/c from portacath with prior hep flush infused. Needle intact. 2x2 placed over the top

## 2014-11-30 NOTE — ED Notes (Signed)
Pt presents to ER for headache x 2 days w/ N/V today.  Pt states that she has had been taking her routine meds per Dr. Caryn SectionFox and was instructed to come to ER if there was no improvement tonight.

## 2014-11-30 NOTE — ED Notes (Signed)
D/c pt home, verbalizes understanding of d/c instructions

## 2014-12-01 NOTE — Progress Notes (Signed)
This patient is ineligible for Transition of Care as she does not have an in network provider.      Krista GoodnessViccarra Coker, LPN  Care Coordinator  [facility]   [address], [address]   [place], [place]   [id number]  Mobile: [phone]  [employer] [facility] Public Site   [employer] Team [facility]   [employer] Website

## 2014-12-05 ENCOUNTER — Emergency Department: Admit: 2014-12-05 | Payer: MEDICARE | Primary: Student in an Organized Health Care Education/Training Program

## 2014-12-05 ENCOUNTER — Inpatient Hospital Stay
Admit: 2014-12-05 | Discharge: 2014-12-05 | Disposition: A | Discharge status: Home / Self Care | Payer: MEDICARE | Attending: Emergency Medicine

## 2014-12-05 DIAGNOSIS — R109 Unspecified abdominal pain: Secondary | ICD-10-CM

## 2014-12-05 LAB — URINE MICROSCOPIC
Casts: 0 /lpf
Mucus: 0 /lpf

## 2014-12-05 MED ORDER — OXYCODONE-ACETAMINOPHEN 5 MG-325 MG TAB
5-325 mg | ORAL | Status: AC
Start: 2014-12-05 — End: 2014-12-05
  Administered 2014-12-05: 23:00:00 via ORAL

## 2014-12-05 MED FILL — OXYCODONE-ACETAMINOPHEN 5 MG-325 MG TAB: 5-325 mg | ORAL | Qty: 2

## 2014-12-05 NOTE — ED Notes (Signed)
I have reviewed discharge instructions with the patient.  The patient verbalized understanding.

## 2014-12-05 NOTE — ED Triage Notes (Signed)
Left flank pain, states that she thinks that she has a kidney stone,

## 2014-12-05 NOTE — ED Provider Notes (Addendum)
HPI Comments: Patient with a history of kidney stones as well as chronic pain, presents to the ER complaining of acute onset of left-sided flank pain.  Patient states symptoms started when she woke up this morning.  Does report some associated nausea and vomiting.  Denies any fevers or chills.    Patient is a 49 y.o. female presenting with flank pain. [name]   Flank Pain    This is a new problem. The current episode started 6 to 12 hours ago. The problem has not changed since onset.The problem occurs constantly. The pain is present in the left side. The quality of the pain is described as aching. The pain is at a severity of 5/10. The pain is moderate. Associated symptoms include abdominal pain. Pertinent negatives include no fever, no numbness, no dysuria, no paresthesias and no paresis. Risk factors include history of kidney stones.        Past Medical History:   Diagnosis Date   ??? Anxiety    ??? Bipolar disorder (HCC)    ??? Calculus of kidney    ??? Depression    ??? Factor II deficiency (HCC)    ??? Factor V Leiden mutation (HCC)    ??? Hematuria, microscopic    ??? Infectious disease 02/2009      Hx MRSA / E.Coli bacteremia, MRSA wound infection 2/2 port   ??? Iron deficiency    ??? Migraines    ??? 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 Restless leg syndrome        Past Surgical History:   Procedure Laterality Date   ??? Cystoscopy  ???     x 13   ??? Lithotripsy  2006   ??? Hx vascular access  July, 2010     Power Port placed in [place], [place]., removed   ??? Hx vascular access Right 2013     pt has current rt subclavian port - placed at [facility]   ??? Hx appendectomy  ???   ??? Hx tonsillectomy     ??? Hx cesarean section     ??? Hx tubal ligation     ??? Hx ovarian cyst removal       on R   ??? Hx orthopaedic       left LE         Family History:   Problem Relation Age of Onset   ??? Bleeding Prob Father      Factor V   ??? Ovarian Cancer Mother 66      passed away due to ca   ??? Bipolar Disorder Mother    ??? Kidney Disease Brother      kidney stones   ??? Cancer Maternal Grandmother      metastatic breast CA.       Social History     Social History   ??? Marital status: DIVORCED     Spouse name: N/A   ??? Number of children: N/A   ??? Years of education: N/A     Occupational History   ??? Not on file.     Social History Main Topics   ??? Smoking status: Never Smoker   ??? Smokeless tobacco: Never Used   ??? Alcohol use Yes      Comment: rarely - only drinks 2 times per year   ??? Drug use: No      Comment: narcotic seeking per  hospital hx.   ??? Sexual activity: Yes     Partners: Male     Birth control/ protection: Surgical     Other Topics Concern   ??? Not on file     Social History Narrative         ALLERGIES: Ativan [lorazepam]; Codeine; Demerol [meperidine]; Hydrocodone-ibuprofen; Iodinated contrast media - iv dye; Ketorolac tromethamine; Pcn [penicillins]; Pneumococcal vaccine; Pneumovax 23 [pneumococcal 23-val ps vaccine]; and Shellfish containing products    Review of Systems   Constitutional: Negative for diaphoresis and fever.   HENT: Negative for dental problem and drooling.    Eyes: Negative for photophobia, redness and visual disturbance.   Respiratory: Negative for chest tightness, wheezing and stridor.    Cardiovascular: Negative for leg swelling.   Gastrointestinal: Positive for abdominal pain, nausea and vomiting. Negative for constipation.   Endocrine: Negative for polydipsia, polyphagia and polyuria.   Genitourinary: Positive for flank pain. Negative for decreased urine volume, dysuria and urgency.   Musculoskeletal: Negative for back pain, gait problem and myalgias.   Allergic/Immunologic: Negative for food allergies and immunocompromised state.   Neurological: Negative for syncope, speech difficulty, numbness and paresthesias.   Hematological: Negative for adenopathy. Does not bruise/bleed easily.    Psychiatric/Behavioral: Negative for behavioral problems and confusion.   All other systems reviewed and are negative.      Vitals:    12/05/14 1511   BP: 106/80   Pulse: (!) 103   Resp: 16   Temp: 98.3 ??F (36.8 ??C)   SpO2: 96%   Weight: 64 kg (141 lb)   Height: 5\' 4"  (1.626 m)            Physical Exam   Constitutional: She is oriented to person, place, and time. She appears well-developed and well-nourished. No distress.   HENT:   Head: Normocephalic and atraumatic.   Mouth/Throat: Oropharynx is clear and moist. No oropharyngeal exudate.   Eyes: Conjunctivae and EOM are normal. Pupils are equal, round, and reactive to light. No scleral icterus.   Neck: Normal range of motion. Neck supple. No JVD present. No tracheal deviation present. No thyromegaly present.   Cardiovascular: Normal rate, regular rhythm, normal heart sounds and intact distal pulses.  Exam reveals no friction rub.    No murmur heard.  Pulmonary/Chest: Effort normal and breath sounds normal. No respiratory distress. She has no wheezes.   Abdominal: Soft. Bowel sounds are normal. She exhibits no distension. There is no tenderness. There is no rebound.   Musculoskeletal: Normal range of motion. She exhibits no edema, tenderness or deformity.   Neurological: She is alert and oriented to person, place, and time. She has normal reflexes. No cranial nerve deficit. Coordination normal.   Skin: Skin is warm and dry. No rash noted. She is not diaphoretic. No erythema.   Nursing note and vitals reviewed.       MDM  Number of Diagnoses or Management Options  Diagnosis management comments: Hx of multiple ED visits as well as chronic pain    Urinalysis show microscopic hematuria  Will send for CT urogram    6:52 PM  Essentially Normal CT urogram, pt with chronic pain issues, will need f/u with PCP         Amount and/or Complexity of Data Reviewed  Clinical lab tests: ordered and reviewed  Tests in the radiology section of CPT??: ordered and reviewed     Risk of Complications, Morbidity, and/or Mortality  Presenting problems: moderate  Diagnostic procedures: low  Management options: moderate    Patient Progress  Patient progress: stable    ED Course       Procedures

## 2014-12-05 NOTE — ED Triage Notes (Signed)
Pt has a port that is not accessed in triage.

## 2014-12-05 NOTE — ED Notes (Signed)
md gave v/o not to access port at this time... V/o to hold off on blood test at this time.

## 2014-12-05 NOTE — ED Notes (Signed)
Pt to er c/o having abd pain... sts she has history of kidney stones and sts this pain feels the same.

## 2014-12-09 LAB — CULTURE, URINE
Culture result:: <1000
Culture: <1000

## 2014-12-26 ENCOUNTER — Inpatient Hospital Stay
Admit: 2014-12-26 | Discharge: 2014-12-26 | Disposition: A | Discharge status: Home / Self Care | Payer: MEDICARE | Attending: Emergency Medicine

## 2014-12-26 DIAGNOSIS — R51 Headache: Secondary | ICD-10-CM

## 2014-12-26 MED ORDER — BUTALBITAL-ACETAMINOPHEN-CAFFEINE 50 MG-300 MG-40 MG CAPSULE
50-300-40 mg | ORAL_CAPSULE | ORAL | 0 refills | Status: DC | PRN
Start: 2014-12-26 — End: 2015-03-09

## 2014-12-26 MED ORDER — HYDROMORPHONE (PF) 1 MG/ML IJ SOLN
1 mg/mL | INTRAMUSCULAR | Status: AC
Start: 2014-12-26 — End: 2014-12-26
  Administered 2014-12-26: 20:00:00 via INTRAMUSCULAR

## 2014-12-26 MED ORDER — PROMETHAZINE 25 MG/ML INJECTION
25 mg/mL | INTRAMUSCULAR | Status: AC
Start: 2014-12-26 — End: 2014-12-26
  Administered 2014-12-26: 19:00:00 via INTRAMUSCULAR

## 2014-12-26 MED ORDER — DIPHENHYDRAMINE HCL 50 MG/ML IJ SOLN
50 mg/mL | INTRAMUSCULAR | Status: AC
Start: 2014-12-26 — End: 2014-12-26
  Administered 2014-12-26: 19:00:00 via INTRAMUSCULAR

## 2014-12-26 MED ORDER — TRAMADOL 50 MG TAB
50 mg | ORAL | Status: AC
Start: 2014-12-26 — End: 2014-12-26
  Administered 2014-12-26: 19:00:00 via ORAL

## 2014-12-26 MED FILL — HYDROMORPHONE (PF) 1 MG/ML IJ SOLN: 1 mg/mL | INTRAMUSCULAR | Qty: 1

## 2014-12-26 MED FILL — DIPHENHYDRAMINE HCL 50 MG/ML IJ SOLN: 50 mg/mL | INTRAMUSCULAR | Qty: 1

## 2014-12-26 MED FILL — PROMETHAZINE 25 MG/ML INJECTION: 25 mg/mL | INTRAMUSCULAR | Qty: 1

## 2014-12-26 MED FILL — TRAMADOL 50 MG TAB: 50 mg | ORAL | Qty: 1

## 2014-12-26 NOTE — ED Provider Notes (Addendum)
HPI Comments: 49 y/o f w hsx migraines and multiple PE's to ed with complaint of headache - left frontal since yesterday. Took her imitrex without relief.  Pain left frontal, with nausea, vomited twice since yesterday.  No fever or chills, no cough congestion or sob.  No chest pain or palpitations.  No urinary sx.      Patient is a 49 y.o. female presenting with headaches. [name] No language interpreter was used.   Headache    This is a new problem. The current episode started yesterday. The problem occurs constantly. The problem has not changed since onset.The headache is aggravated by bright light, activity and photophobia. The pain is located in the left unilateral region. The quality of the pain is described as throbbing. Associated symptoms include anorexia, malaise/fatigue, nausea and vomiting. Pertinent negatives include no fever, no chest pressure, no near-syncope, no orthopnea, no palpitations, no syncope, no shortness of breath, no weakness, no tingling, no dizziness and no visual change. She has tried triptan therapy for the symptoms.        Past Medical History:   Diagnosis Date   ??? Anxiety    ??? Bipolar disorder (HCC)    ??? Calculus of kidney    ??? Depression    ??? Factor II deficiency (HCC)    ??? Factor V Leiden mutation (HCC)    ??? Hematuria, microscopic    ??? Infectious disease 02/2009      Hx MRSA / E.Coli bacteremia, MRSA wound infection 2/2 port   ??? Iron deficiency    ??? Migraines    ??? 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 Restless leg syndrome        Past Surgical History:   Procedure Laterality Date   ??? Cystoscopy  ???     x 13   ??? Lithotripsy  2006   ??? Hx vascular access  July, 2010     Power Port placed in [place], [place]., removed   ??? Hx vascular access Right 2013     pt has current rt subclavian port - placed at [facility]   ??? Hx appendectomy  ???   ??? Hx tonsillectomy     ??? Hx cesarean section      ??? Hx tubal ligation     ??? Hx ovarian cyst removal       on R   ??? Hx orthopaedic       left LE         Family History:   Problem Relation Age of Onset   ??? Bleeding Prob Father      Factor V   ??? Ovarian Cancer Mother 31     passed away due to ca   ??? Bipolar Disorder Mother    ??? Kidney Disease Brother      kidney stones   ??? Cancer Maternal Grandmother      metastatic breast CA.       Social History     Social History   ??? Marital status: DIVORCED     Spouse name: N/A   ??? Number of children: N/A   ??? Years of education: N/A     Occupational History   ??? Not on file.     Social History Main Topics   ??? Smoking status: Never Smoker   ??? Smokeless tobacco: Never Used   ??? Alcohol use Yes  Comment: rarely - only drinks 2 times per year   ??? Drug use: No      Comment: narcotic seeking per hospital hx.   ??? Sexual activity: Yes     Partners: Male     Birth control/ protection: Surgical     Other Topics Concern   ??? Not on file     Social History Narrative         ALLERGIES: Ativan [lorazepam]; Codeine; Demerol [meperidine]; Hydrocodone-ibuprofen; Iodinated contrast media - iv dye; Ketorolac tromethamine; Pcn [penicillins]; Pneumococcal vaccine; Pneumovax 23 [pneumococcal 23-val ps vaccine]; and Shellfish containing products    Review of Systems   Constitutional: Positive for appetite change and malaise/fatigue. Negative for chills and fever.   HENT: Negative for ear pain, facial swelling and mouth sores.    Eyes: Positive for photophobia. Negative for discharge and redness.   Respiratory: Negative for cough, shortness of breath and wheezing.    Cardiovascular: Negative for chest pain, palpitations, orthopnea, syncope and near-syncope.   Gastrointestinal: Positive for anorexia, nausea and vomiting. Negative for abdominal pain.   Endocrine: Negative for cold intolerance and heat intolerance.   Genitourinary: Negative for difficulty urinating and dysuria.   Musculoskeletal: Negative for back pain and neck pain.    Skin: Negative for pallor and rash.   Neurological: Positive for headaches. Negative for dizziness, tingling, tremors, seizures, syncope, facial asymmetry, speech difficulty, weakness, light-headedness and numbness.   Psychiatric/Behavioral: Negative for confusion and decreased concentration.       Vitals:    12/26/14 1313   BP: 128/57   Pulse: (!) 114   Resp: 18   Temp: 97.9 ??F (36.6 ??C)   SpO2: 95%   Weight: 66.2 kg (146 lb)   Height: 5' 4.5" (1.638 m)            Physical Exam   Constitutional: She is oriented to person, place, and time. She appears well-developed and well-nourished. No distress.   HENT:   Head: Normocephalic and atraumatic.   Right Ear: External ear normal.   Left Ear: External ear normal.   Nose: Nose normal.   Eyes: Conjunctivae and EOM are normal. Pupils are equal, round, and reactive to light.   Eyes squinted with light on .   Neck: Normal range of motion. Neck supple.   Cardiovascular: Normal rate, regular rhythm and normal heart sounds.    Pulmonary/Chest: Effort normal and breath sounds normal. No respiratory distress. She has no wheezes.   Abdominal: Soft. Bowel sounds are normal. She exhibits no distension. There is no tenderness.   Musculoskeletal: Normal range of motion. She exhibits no edema or tenderness.   Neurological: She is alert and oriented to person, place, and time. No cranial nerve deficit. Coordination normal.   Perla, eoms intact.  No slurred speech or facial asymmetry.  Grips strong and equal bilat.  Follows commands.  Purposeful mvt all extremities.     Skin: Skin is warm and dry. No rash noted.   Psychiatric: She has a normal mood and affect. Her behavior is normal. Judgment and thought content normal.   Nursing note and vitals reviewed.       MDM  Number of Diagnoses or Management Options  Diagnosis management comments: 49 y/o f with hsx migraine headaches to ed with same.  imitrex didn't help.  wil provide po and im injections and re eval   3:02 PM  Pt still complains with continued headache. Will provide im dilaudid  3:57 PM  Feeling much better  now.  Will dc home    Risk of Complications, Morbidity, and/or Mortality  Presenting problems: minimal  Diagnostic procedures: minimal  Management options: low    Patient Progress  Patient progress: stable    ED Course       Procedures

## 2014-12-26 NOTE — ED Notes (Signed)
Pt resting quietly with eyes closed. Respirations even and non-labored. Pt awakened for discharge at this time. I have reviewed discharge instructions with the patient.  The patient verbalized understanding. Printed instructions, follow-up information, a prescription was given. Pt left ambulatory to waiting area to wait for [employer].

## 2014-12-26 NOTE — ED Notes (Signed)
C/o headache onset yesterday, sts has taken imitrex and fiorcet without relief. Also c/o nausea, denies vomiting. Blankets given and lights out for comfort.

## 2014-12-26 NOTE — ED Triage Notes (Signed)
Reports headache since last night, Imitrex and Fiorcet not working.

## 2014-12-28 ENCOUNTER — Inpatient Hospital Stay
Admit: 2014-12-28 | Discharge: 2014-12-28 | Disposition: A | Discharge status: Home / Self Care | Payer: MEDICARE | Attending: Emergency Medicine

## 2014-12-28 DIAGNOSIS — G43909 Migraine, unspecified, not intractable, without status migrainosus: Secondary | ICD-10-CM

## 2014-12-28 MED ORDER — METOCLOPRAMIDE 5 MG/ML IJ SOLN
5 mg/mL | INTRAMUSCULAR | Status: AC
Start: 2014-12-28 — End: 2014-12-28
  Administered 2014-12-28: 13:00:00 via INTRAVENOUS

## 2014-12-28 MED ORDER — DEXAMETHASONE SODIUM PHOSPHATE 10 MG/ML IJ SOLN
10 mg/mL | INTRAMUSCULAR | Status: AC
Start: 2014-12-28 — End: 2014-12-28
  Administered 2014-12-28: 13:00:00 via INTRAVENOUS

## 2014-12-28 MED ORDER — DIPHENHYDRAMINE HCL 50 MG/ML IJ SOLN
50 mg/mL | INTRAMUSCULAR | Status: AC
Start: 2014-12-28 — End: 2014-12-28
  Administered 2014-12-28: 13:00:00 via INTRAVENOUS

## 2014-12-28 MED ORDER — SODIUM CHLORIDE 0.9 % IJ SYRG
Freq: Three times a day (TID) | INTRAMUSCULAR | Status: DC
Start: 2014-12-28 — End: 2014-12-28

## 2014-12-28 MED ORDER — SODIUM CHLORIDE 0.9 % IJ SYRG
INTRAMUSCULAR | Status: DC | PRN
Start: 2014-12-28 — End: 2014-12-28

## 2014-12-28 MED FILL — METOCLOPRAMIDE 5 MG/ML IJ SOLN: 5 mg/mL | INTRAMUSCULAR | Qty: 2

## 2014-12-28 MED FILL — DIPHENHYDRAMINE HCL 50 MG/ML IJ SOLN: 50 mg/mL | INTRAMUSCULAR | Qty: 1

## 2014-12-28 MED FILL — DEXAMETHASONE SODIUM PHOSPHATE 10 MG/ML IJ SOLN: 10 mg/mL | INTRAMUSCULAR | Qty: 1

## 2014-12-28 NOTE — ED Notes (Signed)
Patient awake, resting in bed.  Patient texting on her cell phone.  Patient on cycling blood pressures and continuous pulse oximetry.  Patient given a warm blanket per request.  Patient denies any other needs.

## 2014-12-28 NOTE — ED Notes (Signed)
Report received from Liz, RN.

## 2014-12-28 NOTE — ED Notes (Signed)
Patient still not found in ER.  Called patient per the phone number in chart.  Patient states she "left the ER because I didn't know how long it would be. I have to pick up my friend and [facility]  Advised patient she cannot leave the ER with her port accessed.  Patient states, "I walked around to entrance B and went in the ladies restroom and pulled it out. It is in the trash can covered with tissues."  MD and charge nurse made aware.

## 2014-12-28 NOTE — ED Notes (Signed)
Patient not located in room when RN went to check on her.  Patient's purse and belongings are not located in room either.

## 2014-12-28 NOTE — ED Triage Notes (Signed)
C/o ha x 2 days

## 2014-12-28 NOTE — ED Notes (Signed)
Pt presents to the ED with a headache for 2 days

## 2014-12-28 NOTE — ED Notes (Signed)
Pt returned to the ER, [facility]. The patient was advised of the risks of damage to her port due to it not being packed with Heparin prior to her removing it. The patient acknowledged understanding of this risk and wanted to leave.

## 2014-12-28 NOTE — ED Provider Notes (Addendum)
Patient is a 49 y.o. female presenting with headaches. [name]   Headache    This is a recurrent problem. Episode onset: last night. The problem occurs constantly. The problem has not changed since onset.The headache is aggravated by bright light and loud noise. The pain is located in the left unilateral region. The quality of the pain is described as throbbing. The pain is severe. Associated symptoms include nausea. Pertinent negatives include no fever, no syncope, no weakness, no tingling and no vomiting. She has tried darkened room and triptan therapy for the symptoms. The treatment provided no relief.        Past Medical History:   Diagnosis Date   ??? Anxiety    ??? Bipolar disorder (HCC)    ??? Calculus of kidney    ??? Depression    ??? Factor II deficiency (HCC)    ??? Factor V Leiden mutation (HCC)    ??? Hematuria, microscopic    ??? Infectious disease 02/2009      Hx MRSA / E.Coli bacteremia, MRSA wound infection 2/2 port   ??? Iron deficiency    ??? Migraines    ??? 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 Restless leg syndrome        Past Surgical History:   Procedure Laterality Date   ??? Cystoscopy  ???     x 13   ??? Lithotripsy  2006   ??? Hx vascular access  July, 2010     Power Port placed in [place], [place]., removed   ??? Hx vascular access Right 2013     pt has current rt subclavian port - [facility]   ??? Hx appendectomy  ???   ??? Hx tonsillectomy     ??? Hx cesarean section     ??? Hx tubal ligation     ??? Hx ovarian cyst removal       on R   ??? Hx orthopaedic       left LE         Family History:   Problem Relation Age of Onset   ??? Bleeding Prob Father      Factor V   ??? Ovarian Cancer Mother 15     passed away due to ca   ??? Bipolar Disorder Mother    ??? Kidney Disease Brother      kidney stones   ??? Cancer Maternal Grandmother      metastatic breast CA.       Social History     Social History   ??? Marital status: DIVORCED      Spouse name: N/A   ??? Number of children: N/A   ??? Years of education: N/A     Occupational History   ??? Not on file.     Social History Main Topics   ??? Smoking status: Never Smoker   ??? Smokeless tobacco: Never Used   ??? Alcohol use Yes      Comment: rarely - only drinks 2 times per year   ??? Drug use: No      Comment: narcotic seeking per hospital hx.   ??? Sexual activity: Yes     Partners: Male     Birth control/ protection: Surgical     Other Topics Concern   ??? Not on file     Social History Narrative  ALLERGIES: Ativan [lorazepam]; Codeine; Demerol [meperidine]; Hydrocodone-ibuprofen; Iodinated contrast media - iv dye; Ketorolac tromethamine; Pcn [penicillins]; Pneumococcal vaccine; Pneumovax 23 [pneumococcal 23-val ps vaccine]; and Shellfish containing products    Review of Systems   Constitutional: Negative for chills and fever.   HENT: Negative for congestion and rhinorrhea.    Cardiovascular: Negative for syncope.   Gastrointestinal: Positive for nausea. Negative for vomiting.   Endocrine: Negative for polydipsia and polyuria.   Genitourinary: Negative for dysuria and hematuria.   Neurological: Positive for headaches. Negative for tingling and weakness.       Vitals:    12/28/14 0631   BP: 148/66   Pulse: (!) 119   Resp: 18   Temp: 98.1 ??F (36.7 ??C)   SpO2: 96%   Weight: 65.3 kg (144 lb)   Height: 5\' 4"  (1.626 m)            Physical Exam   Constitutional: She is oriented to person, place, and time. She appears well-developed and well-nourished.   Eyes: Conjunctivae are normal. Pupils are equal, round, and reactive to light.   Neck: Normal range of motion. Neck supple.   Cardiovascular: Normal rate, regular rhythm and normal heart sounds.    No murmur heard.  Pulmonary/Chest: Breath sounds normal. No respiratory distress.   Abdominal: Soft. She exhibits no distension. There is no tenderness. There is no rebound and no guarding.   Musculoskeletal: Normal range of motion. She exhibits no edema or  tenderness.   Neurological: She is alert and oriented to person, place, and time. She has normal strength. No cranial nerve deficit or sensory deficit. She exhibits normal muscle tone. Coordination normal. GCS eye subscore is 4. GCS verbal subscore is 5. GCS motor subscore is 6.   Skin: Skin is warm and dry.   Nursing note and vitals reviewed.       MDM  Number of Diagnoses or Management Options  Diagnosis management comments: Migraine headache typical of her previous multiple migraines.  Frequent visitor to the emergency department for migraines.  No thunderclap headache.    8:48 AM  I went to reevaluate the treatment efficacy and the patient was not in the room.  Appears as if she left the facility.    ED Course       Procedures

## 2014-12-28 NOTE — ED Notes (Signed)
Report given to Amy, RN.  NAD

## 2014-12-29 NOTE — Progress Notes (Signed)
Transition of Care Discharge Follow-up Questionnaire   Date/Time of Call:   12/29/2014 11:20a.m.   What was the patient hospitalized for?     ED visit for headaches         Does the patient understand his/her diagnosis and/or treatment and what happened during the hospitalization?     Yes   Did the patient receive discharge instructions?   Yes - but left ED before tx completed   Review any discharge instructions (see notes in [facility]).  Ask patient if they understand these.  Do they have any questions?     Says she has no PCP   Were home services ordered (nursing, PT, OT, ST, etc.)?   No     If so, has the first visit occurred? If not, why? (Assist with coordination of services if necessary.)   No       Was any DME ordered?   No   If so, has it been received?  If not, why?  (Assist with coordination of arranging DME orders if necessary.)          Complete a review of all medications (new, continued and discontinued meds per the D/C instructions and medication tab in [facility]).     Completed review by phone       Were all new prescriptions filled?  If not, why?  (Assist with obtainment of medications if necessary.)     N/A     Does the patient understand the purpose and dosing instructions for all medications?  (If patient has questions, provide explanation and education.) Yes   Does the patient have any problems in performing ADL???s? (If patient is unable to perform ADLs ??? what is the limiting factor(s)?  Do they have a support system that can assist? If no support system is present, discuss possible assistance that they may be able to obtain.)   No           Does the patient have all follow-up appointments scheduled?  Has transportation been arranged?  [facility] follow-up should be within 7 days of discharge; all others should have PCP follow-up within 7 days of discharge; follow-ups with other specialists as appropriate or ordered.) No - but agreed to let  me schedule a PCP visit for her. She says she uses the bus for transportation   Any other questions or concerns expressed by the patient?     No   [facility] or refer to RN Case Manager/Social Services Case Manager as appropriate. Scheduled to speak with her next week to provide a PCP follow up date/time.    TOC Call Completed By: Tomma RakersLorie Jamaria Amborn,RN,CCM

## 2015-01-09 NOTE — Progress Notes (Signed)
[facility] follow up calls were made to several [facility] offices and determined that [facility] [place] is accepting new patients and that is a closer location for the patient. I contacted Krista Lopez and informed her of this and offered to call to make the appointment but she stated she preferred to do this due to her schedule with getting transportation. I asked if she needed assistance with transportation but she says she has a friend to take her or she will take the bus. I provided her the phone and address for this practice and she tells me she will cal and set up an appointment for first available. I will plan another follow up call in about 3 weeks.

## 2015-01-18 ENCOUNTER — Encounter: Payer: MEDICARE | Attending: Family Medicine | Primary: Student in an Organized Health Care Education/Training Program

## 2015-01-29 ENCOUNTER — Inpatient Hospital Stay
Admit: 2015-01-29 | Discharge: 2015-01-29 | Disposition: A | Discharge status: Home / Self Care | Payer: MEDICARE | Attending: Emergency Medicine

## 2015-01-29 ENCOUNTER — Emergency Department: Admit: 2015-01-29 | Payer: MEDICARE | Primary: Student in an Organized Health Care Education/Training Program

## 2015-01-29 DIAGNOSIS — R1032 Left lower quadrant pain: Secondary | ICD-10-CM

## 2015-01-29 LAB — METABOLIC PANEL, COMPREHENSIVE
A-G Ratio: 1.2 (ref 1.2–3.5)
ALT (SGPT): 17 U/L (ref 12–65)
AST (SGOT): 15 U/L (ref 15–37)
Albumin: 3.8 g/dL (ref 3.5–5.0)
Alk. phosphatase: 135 U/L (ref 50–136)
Anion gap: 10 mmol/L (ref 7–16)
BUN: 16 MG/DL (ref 6–23)
Bilirubin, total: 0.1 MG/DL — ABNORMAL LOW (ref 0.2–1.1)
CO2: 25 mmol/L (ref 21–32)
Calcium: 8.8 MG/DL (ref 8.3–10.4)
Chloride: 107 mmol/L (ref 98–107)
Creatinine: 0.77 MG/DL (ref 0.6–1.0)
GFR est AA: >60 mL/min/{1.73_m2} (ref >60)
GFR est non-AA: >60 mL/min/{1.73_m2} (ref >60)
Globulin: 3.1 g/dL (ref 2.3–3.5)
Glucose: 86 mg/dL (ref 65–100)
Potassium: 4.5 mmol/L (ref 3.5–5.1)
Protein, total: 6.9 g/dL (ref 6.3–8.2)
Sodium: 142 mmol/L (ref 136–145)

## 2015-01-29 LAB — CBC WITH AUTOMATED DIFF
ABS. BASOPHILS: 0 10*3/uL (ref 0.0–0.2)
ABS. EOSINOPHILS: 0.5 10*3/uL (ref 0.0–0.8)
ABS. IMM. GRANS.: 0 10*3/uL (ref 0.0–0.5)
ABS. LYMPHOCYTES: 1.5 10*3/uL (ref 0.5–4.6)
ABS. MONOCYTES: 0.5 10*3/uL (ref 0.1–1.3)
ABS. NEUTROPHILS: 1.8 10*3/uL (ref 1.7–8.2)
BASOPHILS: 1 % (ref 0.0–2.0)
EOSINOPHILS: 12 % — ABNORMAL HIGH (ref 0.5–7.8)
HCT: 33.4 % — ABNORMAL LOW (ref 35.8–46.3)
HGB: 10.8 g/dL — ABNORMAL LOW (ref 11.7–15.4)
IMMATURE GRANULOCYTES: 0.2 % (ref 0.0–5.0)
LYMPHOCYTES: 36 % (ref 13–44)
MCH: 29.2 PG (ref 26.1–32.9)
MCHC: 32.3 g/dL (ref 31.4–35.0)
MCV: 90.3 FL (ref 79.6–97.8)
MONOCYTES: 11 % (ref 4.0–12.0)
MPV: 10.1 FL — ABNORMAL LOW (ref 10.8–14.1)
NEUTROPHILS: 40 % — ABNORMAL LOW (ref 43–78)
PLATELET: 331 10*3/uL (ref 150–450)
RBC: 3.7 M/uL — ABNORMAL LOW (ref 4.05–5.25)
RDW: 13.9 % (ref 11.9–14.6)
WBC: 4.3 10*3/uL (ref 4.3–11.1)

## 2015-01-29 LAB — PROTHROMBIN TIME + INR
INR: 1.5 — ABNORMAL HIGH (ref 0.9–1.2)
Prothrombin time: 15.9 s — ABNORMAL HIGH (ref 9.6–12.0)

## 2015-01-29 LAB — URINE MICROSCOPIC

## 2015-01-29 LAB — LIPASE: Lipase: 126 U/L (ref 73–393)

## 2015-01-29 MED ORDER — DICYCLOMINE 10 MG CAP
10 mg | ORAL_CAPSULE | Freq: Three times a day (TID) | ORAL | 0 refills | Status: AC
Start: 2015-01-29 — End: 2015-02-03

## 2015-01-29 MED ORDER — DIPHENHYDRAMINE HCL 50 MG/ML IJ SOLN
50 mg/mL | INTRAMUSCULAR | Status: AC
Start: 2015-01-29 — End: 2015-01-29
  Administered 2015-01-29: 16:00:00 via INTRAVENOUS

## 2015-01-29 MED ORDER — ONDANSETRON (PF) 4 MG/2 ML INJECTION
4 mg/2 mL | INTRAMUSCULAR | Status: AC
Start: 2015-01-29 — End: 2015-01-29
  Administered 2015-01-29: 16:00:00 via INTRAVENOUS

## 2015-01-29 MED ORDER — HEPARIN, PORCINE (PF) 100 UNIT/ML IV SYRINGE
100 unit/mL | INTRAVENOUS | Status: DC | PRN
Start: 2015-01-29 — End: 2015-01-29
  Administered 2015-01-29: 17:00:00

## 2015-01-29 MED ORDER — MORPHINE 4 MG/ML SYRINGE
4 mg/mL | INTRAMUSCULAR | Status: AC
Start: 2015-01-29 — End: 2015-01-29
  Administered 2015-01-29: 16:00:00 via INTRAVENOUS

## 2015-01-29 MED FILL — ONDANSETRON (PF) 4 MG/2 ML INJECTION: 4 mg/2 mL | INTRAMUSCULAR | Qty: 2

## 2015-01-29 MED FILL — MONOJECT PREFILL ADVANCED (PF) 100 UNIT/ML INTRAVENOUS SYRINGE: 100 unit/mL | INTRAVENOUS | Qty: 3

## 2015-01-29 MED FILL — DIPHENHYDRAMINE HCL 50 MG/ML IJ SOLN: 50 mg/mL | INTRAMUSCULAR | Qty: 1

## 2015-01-29 MED FILL — MORPHINE 4 MG/ML SYRINGE: 4 mg/mL | INTRAMUSCULAR | Qty: 1

## 2015-01-29 NOTE — ED Notes (Signed)
I have reviewed discharge instructions with the patient.  The patient verbalized understanding. Port heparinzed and needle taken out. Med Krista Lopez coming to take patient home. Signed copy of AVS placed in chart.

## 2015-01-29 NOTE — ED Triage Notes (Signed)
Pt reports left sided abdominal pain since yesterday Pt denies n/v/d. Pt reports she hasn't had her coumadin levels in about a week. Pt states she has had bleeding from her gums since yesterday.

## 2015-01-29 NOTE — ED Notes (Signed)
[facility] called and set up transfer to residence, confirmation number [id number].

## 2015-01-29 NOTE — Progress Notes (Signed)
Visited with patient to see if she had made a PCP appointment.  States [facility] but states she will only see them once because she is moving to [place] on 02/13/15.  States her brother has already found her an apartment and put down the security and the first months rent. States she will go to the appointment on 12/28 before she leaves though.  Call to [facility] and spoke to [facility] who states patient does not have an appointment with them. Email to [employer] to update.

## 2015-01-29 NOTE — ED Provider Notes (Signed)
HPI Comments: Patient is a 49 year old female who is coming in with left lower quadrant abdominal pain that has been constant no vomiting or diarrhea.  She's also had some bleeding from her gums that started yesterday and then a nosebleed throughout the night.  She is on Coumadin and states she takes 5 mg every day last had it checked one week ago and states it was in the 3 range.   Patient also reports coughing some blood.    Patient is a 49 y.o. female presenting with abdominal pain. [name]   Abdominal Pain    Pertinent negatives include no fever, no diarrhea, no nausea and no vomiting.        Past Medical History:   Diagnosis Date   ??? Anxiety    ??? Bipolar disorder ([place])    ??? Calculus of kidney    ??? Depression    ??? 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 Hematuria, microscopic    ??? Infectious disease 02/2009      Hx MRSA / E.Coli bacteremia, MRSA wound infection 2/2 port   ??? Iron deficiency    ??? Migraines    ??? 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 Restless leg syndrome        Past Surgical History:   Procedure Laterality Date   ??? Cystoscopy  ???     x 13   ??? Lithotripsy  2006   ??? Hx vascular access  July, 2010     Power Port placed in [place], [place]., removed   ??? Hx vascular access Right 2013     pt has current rt subclavian port - placed at [facility]   ??? Hx appendectomy  ???   ??? Hx tonsillectomy     ??? Hx cesarean section     ??? Hx tubal ligation     ??? Hx ovarian cyst removal       on R   ??? Hx orthopaedic       left LE         Family History:   Problem Relation Age of Onset   ??? Bleeding Prob Father      Factor V   ??? Ovarian Cancer Mother 69     passed away due to ca   ??? Bipolar Disorder Mother    ??? Kidney Disease Brother      kidney stones   ??? Cancer Maternal Grandmother      metastatic breast CA.       Social History     Social History   ??? Marital status: DIVORCED     Spouse name: N/A    ??? Number of children: N/A   ??? Years of education: N/A     Occupational History   ??? Not on file.     Social History Main Topics   ??? Smoking status: Never Smoker   ??? Smokeless tobacco: Never Used   ??? Alcohol use Yes      Comment: rarely - only drinks 2 times per year   ??? Drug use: No      Comment: narcotic seeking per hospital hx.   ??? Sexual activity: Yes     Partners: Male     Birth control/ protection: Surgical     Other Topics Concern   ??? Not on file  Social History Narrative         ALLERGIES: Ativan [lorazepam]; Codeine; Demerol [meperidine]; Hydrocodone-ibuprofen; Iodinated contrast media - oral and iv dye; Ketorolac tromethamine; Pcn [penicillins]; Pneumococcal vaccine; Pneumovax 23 [pneumococcal 23-val ps vaccine]; and Shellfish containing products    Review of Systems   Constitutional: Negative for chills and fever.   HENT: Positive for nosebleeds.    Gastrointestinal: Positive for abdominal pain. Negative for diarrhea, nausea and vomiting.   Skin: Negative.    All other systems reviewed and are negative.      Vitals:    01/29/15 1007   BP: 115/83   Pulse: 96   Resp: 16   Temp: 97.8 ??F (36.6 ??C)   SpO2: 97%   Weight: 64.4 kg (142 lb)   Height: 5' 4.5" (1.638 m)            Physical Exam   Constitutional: She is oriented to person, place, and time. She appears well-developed and well-nourished. No distress.   HENT:   Head: Normocephalic and atraumatic.   Eyes: Conjunctivae are normal. No scleral icterus.   Neck: Normal range of motion. Neck supple.   Cardiovascular: Normal rate, regular rhythm and normal heart sounds.    Pulmonary/Chest: Effort normal and breath sounds normal. No stridor. No respiratory distress. She has no wheezes. She has no rales. She exhibits no tenderness.   Abdominal: Soft. She exhibits no distension. There is no tenderness. There is no rebound and no guarding.   Neurological: She is alert and oriented to person, place, and time.   No focal weakness    Skin: Skin is warm and dry. No rash noted. She is not diaphoretic. No erythema.   Psychiatric: She has a normal mood and affect. Her behavior is normal.   Nursing note and vitals reviewed.       MDM  Number of Diagnoses or Management Options  Diagnosis management comments: Patient has normal lab work, and x-rays.  Her INR is slightly low at 1.5, however given the nose and gums bleeding I will not currently change her dose.  She has multiple visits to our emergency department and [place] in [place] [facility] emergency departments I have reviewed the records from these visits.  I have treated patient's pain here, but will not write for any narcotics as an outpatient.  We will do symptomatic care patient comfortable with outpatient plan.      Freddi Che, MD; 01/29/2015 _0 :20 AM Voice dictation software was used during the making of this note.  This software is not perfect and grammatical and other typographical errors may be present.  This note has not been proofread for errors.  ===================================================================        Amount and/or Complexity of Data Reviewed  Clinical lab tests: reviewed and ordered (Results for orders placed or performed during the hospital encounter of 01/29/15  -CBC WITH AUTOMATED DIFF       Result                                            Value                         Ref Range                       WBC  4.3                           4.3 - 11.1 K/uL                 RBC                                               3.70 (L)                      4.05 - 5.25 M/uL                HGB                                               10.8 (L)                      11.7 - 15.4 g/dL                HCT                                               33.4 (L)                      35.8 - 46.3 %                   MCV                                               90.3                           79.6 - 97.8 FL                  MCH                                               29.2                          26.1 - 32.9 PG                  MCHC                                              32.3                          31.4 - 35.0 g/dL                RDW  13.9                          11.9 - 14.6 %                   PLATELET                                          331                           150 - 450 K/uL                  MPV                                               10.1 (L)                      10.8 - 14.1 FL                  DF                                                AUTOMATED                                                     NEUTROPHILS                                       40 (L)                        43 - 78 %                       LYMPHOCYTES                                       36                            13 - 44 %                       MONOCYTES                                         11                            4.0 - 12.0 %  EOSINOPHILS                                       12 (H)                        0.5 - 7.8 %                     BASOPHILS                                         1                             0.0 - 2.0 %                     IMMATURE GRANULOCYTES                             0.2                           0.0 - 5.0 %                     ABS. NEUTROPHILS                                  1.8                           1.7 - 8.2 K/UL                  ABS. LYMPHOCYTES                                  1.5                           0.5 - 4.6 K/UL                  ABS. MONOCYTES                                    0.5                           0.1 - 1.3 K/UL                  ABS. EOSINOPHILS                                  0.5                           0.0 - 0.8 K/UL                  ABS. BASOPHILS  0.0                           0.0 - 0.2 K/UL                   ABS. IMM. GRANS.                                  0.0                           0.0 - 0.5 K/UL             -METABOLIC PANEL, COMPREHENSIVE       Result                                            Value                         Ref Range                       Sodium                                            142                           136 - 145 mmol/L                Potassium                                         4.5                           3.5 - 5.1 mmol/L                Chloride                                          107                           98 - 107 mmol/L                 CO2                                               25                            21 - 32 mmol/L                  Anion gap  10                            7 - 16 mmol/L                   Glucose                                           86                            65 - 100 mg/dL                  BUN                                               16                            6 - 23 MG/DL                    Creatinine                                        0.77                          0.6 - 1.0 MG/DL                 GFR est AA                                        >60                           >60 ml/min/1.21m               GFR est non-AA                                    >60                           >60 ml/min/1.741m              Calcium                                           8.8                           8.3 - 10.4 MG/DL                Bilirubin, total  0.1 (L)                       0.2 - 1.1 MG/DL                 ALT                                               17                            12 - 65 U/L                     AST                                               15                            15 - 37 U/L                     Alk. phosphatase                                  135                           50 - 136 U/L                     Protein, total                                    6.9                           6.3 - 8.2 g/dL                  Albumin                                           3.8                           3.5 - 5.0 g/dL                  Globulin                                          3.1                           2.3 - 3.5 g/dL                  A-G Ratio  1.2                           1.2 - 3.5                  -PROTHROMBIN TIME + INR       Result                                            Value                         Ref Range                       Prothrombin time                                  15.9 (H)                      9.6 - 12.0 sec                  INR                                               1.5 (H)                       0.9 - 1.2                  -LIPASE       Result                                            Value                         Ref Range                       Lipase                                            126                           73 - 393 U/L               -URINE MICROSCOPIC       Result                                            Value                         Ref Range  WBC                                               5-10                          0 /hpf                          RBC                                               0-3                           0 /hpf                          Epithelial cells                                  5-10                          0 /hpf                          Bacteria                                          TRACE                         0 /hpf                          Casts                                             3-5                           0 /lpf                    )  Tests in the radiology section of CPT??: ordered and reviewed (Xr Abd Acute W 1 V Chest    Result Date: 01/29/2015   XR ABD ACUTE W 1 V CHEST    01/29/2015 10:47 AM CLINICAL INFORMATION:  Moderate left lower quadrant abdominal pain. Hemoptysis since yesterday. Anticoagulated on Coumadin. Comparison: 11/09/2014. Findings: PA chest; upright and two-view supine abdomen. Stable right IJ chest wall port terminating in the SVC. No acute abnormal mildly in the chest. No free intraperitoneal gas. Incidental IVC filter. Scattered gas and stool are present throughout the colon. No dilated loops of bowel to suggest ileus or obstruction. No abnormal abdominal calcifications. Tiny incidental left pelvic phleboliths.     IMPRESSION: 1. No  acute abnormality.     )    Risk of Complications, Morbidity, and/or Mortality  Presenting problems: high  Diagnostic procedures: high  Management options: high      ED Course       Procedures

## 2015-01-31 NOTE — Progress Notes (Signed)
TOC follow up call to Krista Lopez, no answer, left message and will follow up again next week. Recently d/c from ED

## 2015-02-04 ENCOUNTER — Inpatient Hospital Stay
Admit: 2015-02-04 | Discharge: 2015-02-05 | Disposition: A | Discharge status: Home / Self Care | Payer: MEDICARE | Attending: Emergency Medicine

## 2015-02-04 ENCOUNTER — Emergency Department: Admit: 2015-02-04 | Payer: MEDICARE | Primary: Student in an Organized Health Care Education/Training Program

## 2015-02-04 DIAGNOSIS — R0789 Other chest pain: Secondary | ICD-10-CM

## 2015-02-04 MED ORDER — ALBUTEROL SULFATE 0.083 % (0.83 MG/ML) SOLN FOR INHALATION
2.5 mg /3 mL (0.083 %) | RESPIRATORY_TRACT | Status: AC
Start: 2015-02-04 — End: 2015-02-04
  Administered 2015-02-04: via RESPIRATORY_TRACT

## 2015-02-04 MED ORDER — SODIUM CHLORIDE 0.9 % IV
Freq: Once | INTRAVENOUS | Status: AC
Start: 2015-02-04 — End: 2015-02-04
  Administered 2015-02-05: via INTRAVENOUS

## 2015-02-04 MED FILL — ALBUTEROL SULFATE 0.083 % (0.83 MG/ML) SOLN FOR INHALATION: 2.5 mg /3 mL (0.083 %) | RESPIRATORY_TRACT | Qty: 2

## 2015-02-04 NOTE — ED Notes (Signed)
Pt states pain resolved in pain medicine.

## 2015-02-04 NOTE — ED Notes (Signed)
I have reviewed discharge instructions with the patient.  The patient verbalized understanding.Pt to waiting room , waiting for medicade van.

## 2015-02-04 NOTE — ED Triage Notes (Signed)
Reports left upper chest and back pain today.

## 2015-02-04 NOTE — ED Provider Notes (Addendum)
HPI Comments: [name] is a 49 year old female who presents today with left upper chest pain.  Started this evening.  Sharp pain.  No radiation.  Associated with some shortness of breath    Patient history is notable for recurrent pulmonary emboli.  Approximately 14.  She is currently prescribed Coumadin.  She states that she is taking it as scheduled and that there've been no changes made.  However her INR today is 1.  She does have a [place] filter in place    She's had several other  Visits this month for abdominal pain and chest pain both here in [facility].        Patient is a 49 y.o. female presenting with shortness of breath.   Shortness of Breath   Associated symptoms include chest pain. Pertinent negatives include no fever, no cough, no wheezing, no abdominal pain and no leg swelling.        Past Medical History:   Diagnosis Date   ??? Anxiety    ??? Bipolar disorder (HCC)    ??? Calculus of kidney    ??? Depression    ??? Factor II deficiency (HCC)    ??? Factor V Leiden mutation (HCC)    ??? Hematuria, microscopic    ??? Infectious disease 02/2009      Hx MRSA / E.Coli bacteremia, MRSA wound infection 2/2 port   ??? Iron deficiency    ??? Migraines    ??? 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 Restless leg syndrome        Past Surgical History:   Procedure Laterality Date   ??? Cystoscopy  ???     x 13   ??? Lithotripsy  2006   ??? Hx vascular access  July, 2010     Power Port placed in [place], [place]., removed   ??? Hx vascular access Right 2013     pt has current rt subclavian port - placed at [facility]   ??? Hx appendectomy  ???   ??? Hx tonsillectomy     ??? Hx cesarean section     ??? Hx tubal ligation     ??? Hx ovarian cyst removal       on R   ??? Hx orthopaedic       left LE         Family History:   Problem Relation Age of Onset   ??? Bleeding Prob Father      Factor V   ??? Ovarian Cancer Mother 45     passed away due to ca   ??? Bipolar Disorder Mother     ??? Kidney Disease Brother      kidney stones   ??? Cancer Maternal Grandmother      metastatic breast CA.       Social History     Social History   ??? Marital status: DIVORCED     Spouse name: N/A   ??? Number of children: N/A   ??? Years of education: N/A     Occupational History   ??? Not on file.     Social History Main Topics   ??? Smoking status: Never Smoker   ??? Smokeless tobacco: Never Used   ??? Alcohol use Yes      Comment: rarely - only drinks 2 times per year   ??? Drug use: No      Comment: narcotic seeking per hospital hx.   ???  Sexual activity: Yes     Partners: Male     Birth control/ protection: Surgical     Other Topics Concern   ??? Not on file     Social History Narrative         ALLERGIES: Ativan [lorazepam]; Codeine; Demerol [meperidine]; Hydrocodone-ibuprofen; Iodinated contrast media - oral and iv dye; Ketorolac tromethamine; Pcn [penicillins]; Pneumococcal vaccine; Pneumovax 23 [pneumococcal 23-val ps vaccine]; and Shellfish containing products    Review of Systems   Constitutional: Negative for chills and fever.   HENT: Negative.    Respiratory: Positive for shortness of breath. Negative for cough, chest tightness and wheezing.    Cardiovascular: Positive for chest pain. Negative for palpitations and leg swelling.   Gastrointestinal: Negative for abdominal distention, abdominal pain, anal bleeding and blood in stool.   Musculoskeletal: Negative.    Psychiatric/Behavioral: Negative.        Vitals:    02/04/15 1827 02/04/15 1851   BP: 141/84    Pulse: 84    Resp: 22    Temp: 98 ??F (36.7 ??C)    SpO2: 100% 95%   Weight: 64.4 kg (142 lb)    Height: 5\' 4"  (1.626 m)             Physical Exam   Constitutional: She is oriented to person, place, and time. She appears well-developed and well-nourished.   HENT:   Head: Normocephalic and atraumatic.   Eyes: EOM are normal. Pupils are equal, round, and reactive to light.   Cardiovascular: Normal rate and regular rhythm.     Pulmonary/Chest: Breath sounds normal. No respiratory distress. She has no wheezes.   Abdominal: Soft. She exhibits no distension. There is no tenderness.   Musculoskeletal: Normal range of motion.   No calf pain/swelling/tenderness   Neurological: She is alert and oriented to person, place, and time. No cranial nerve deficit.   Skin: Skin is warm and dry.   Psychiatric: She has a normal mood and affect. Her behavior is normal.   Nursing note and vitals reviewed.       MDM  Number of Diagnoses or Management Options  Diagnosis management comments: Well-appearing 49 year old female with history of PE presents to the emergency Department left upper chest pain.  She is not tachycardic, tachypneic or hypoxic.  She has no leg pain or leg swelling.    She reports that her last PE was in February of this year when she stopped taking her Coumadin and traveled by bus to [place].  Review of Care everywhere shows a [place] trip from [place] during which [place] was admitted for a [place] in [place].-- this occurred in July    She is followed by Dr. Anola GurneyWalls from Hematology-- per notes she is to be on Lovenox, not coumadin.    She reports a hx of allergy to Contrast- however has be able to have CT with contrast several times this year after being pre-medicated.  I don't think CT of the chest is necessary today- she is hemodynamically stable and a positive (or negative) CT or VQ  Is not going to change her management-- back on anti-coagulant and outpatient follow up.               Amount and/or Complexity of Data Reviewed  Clinical lab tests: ordered  Tests in the radiology section of CPT??: ordered  Tests in the medicine section of CPT??: ordered  Review and summarize past medical records: yes    Risk  of Complications, Morbidity, and/or Mortality  Presenting problems: moderate  Management options: moderate      ED Course       Procedures

## 2015-02-05 LAB — CBC W/O DIFF
HCT: 34.2 % — ABNORMAL LOW (ref 35.8–46.3)
HGB: 11.2 g/dL — ABNORMAL LOW (ref 11.7–15.4)
MCH: 29.6 PG (ref 26.1–32.9)
MCHC: 32.7 g/dL (ref 31.4–35.0)
MCV: 90.2 FL (ref 79.6–97.8)
MPV: 10.9 FL (ref 10.8–14.1)
PLATELET: 300 10*3/uL (ref 150–450)
RBC: 3.79 M/uL — ABNORMAL LOW (ref 4.05–5.25)
RDW: 14.5 % (ref 11.9–14.6)
WBC: 6 10*3/uL (ref 4.3–11.1)

## 2015-02-05 LAB — URINE MICROSCOPIC
Casts: 0 /lpf
Crystals, urine: 0 /LPF
Mucus: 0 /lpf
RBC: 0 /hpf

## 2015-02-05 LAB — PROTHROMBIN TIME + INR
INR: 1.1 (ref 0.9–1.2)
Prothrombin time: 12.1 s — ABNORMAL HIGH (ref 9.6–12.0)

## 2015-02-05 LAB — METABOLIC PANEL, BASIC
Anion gap: 12 mmol/L (ref 7–16)
BUN: 24 MG/DL — ABNORMAL HIGH (ref 6–23)
CO2: 25 mmol/L (ref 21–32)
Calcium: 9 MG/DL (ref 8.3–10.4)
Chloride: 107 mmol/L (ref 98–107)
Creatinine: 0.81 MG/DL (ref 0.6–1.0)
GFR est AA: >60 mL/min/{1.73_m2} (ref >60)
GFR est non-AA: >60 mL/min/{1.73_m2} (ref >60)
Glucose: 111 mg/dL — ABNORMAL HIGH (ref 65–100)
Potassium: 3.9 mmol/L (ref 3.5–5.1)
Sodium: 144 mmol/L (ref 136–145)

## 2015-02-05 MED ORDER — HEPARIN, PORCINE (PF) 100 UNIT/ML IV SYRINGE
100 unit/mL | INTRAVENOUS | Status: DC | PRN
Start: 2015-02-05 — End: 2015-02-05
  Administered 2015-02-05: 03:00:00

## 2015-02-05 MED ORDER — ENOXAPARIN 100 MG/ML SUB-Q SYRINGE
100 mg/mL | SUBCUTANEOUS | Status: AC
Start: 2015-02-05 — End: 2015-02-04
  Administered 2015-02-05: 02:00:00 via SUBCUTANEOUS

## 2015-02-05 MED ORDER — ONDANSETRON 4 MG TAB, RAPID DISSOLVE
4 mg | ORAL | Status: AC
Start: 2015-02-05 — End: 2015-02-04
  Administered 2015-02-05: 01:00:00 via ORAL

## 2015-02-05 MED ORDER — NITROFURANTOIN (25% MACROCRYSTAL FORM) 100 MG CAP
100 mg | ORAL_CAPSULE | Freq: Two times a day (BID) | ORAL | 0 refills | Status: AC
Start: 2015-02-05 — End: 2015-02-11

## 2015-02-05 MED ORDER — ACETAMINOPHEN 500 MG TAB
500 mg | ORAL | Status: AC
Start: 2015-02-05 — End: 2015-02-04
  Administered 2015-02-05: 01:00:00 via ORAL

## 2015-02-05 MED ORDER — HYDROMORPHONE (PF) 1 MG/ML IJ SOLN
1 mg/mL | INTRAMUSCULAR | Status: AC
Start: 2015-02-05 — End: 2015-02-04
  Administered 2015-02-05: 02:00:00 via INTRAVENOUS

## 2015-02-05 MED ORDER — WARFARIN 5 MG TAB
5 mg | ORAL | Status: AC
Start: 2015-02-05 — End: 2015-02-04
  Administered 2015-02-05: 02:00:00 via ORAL

## 2015-02-05 MED ORDER — HYDROCODONE-ACETAMINOPHEN 5 MG-325 MG TAB
5-325 mg | ORAL_TABLET | Freq: Four times a day (QID) | ORAL | 0 refills | Status: DC | PRN
Start: 2015-02-05 — End: 2015-03-09

## 2015-02-05 MED FILL — WARFARIN 5 MG TAB: 5 mg | ORAL | Qty: 2

## 2015-02-05 MED FILL — ONDANSETRON 4 MG TAB, RAPID DISSOLVE: 4 mg | ORAL | Qty: 1

## 2015-02-05 MED FILL — LOVENOX 100 MG/ML SUBCUTANEOUS SYRINGE: 100 mg/mL | SUBCUTANEOUS | Qty: 1

## 2015-02-05 MED FILL — MONOJECT PREFILL ADVANCED (PF) 100 UNIT/ML INTRAVENOUS SYRINGE: 100 unit/mL | INTRAVENOUS | Qty: 3

## 2015-02-05 MED FILL — MAPAP EXTRA STRENGTH 500 MG TABLET: 500 mg | ORAL | Qty: 2

## 2015-02-05 MED FILL — HYDROMORPHONE (PF) 1 MG/ML IJ SOLN: 1 mg/mL | INTRAMUSCULAR | Qty: 1

## 2015-02-06 NOTE — Progress Notes (Signed)
This note will not be viewable in MyChart.      ED Discharge 02/04/2015    Patient engaged with Nurse Case Manager, Mechele DawleyLori White.  Will forward note.

## 2015-02-11 HISTORY — PX: HX EMBOLECTOMY: SHX44

## 2015-02-12 NOTE — Progress Notes (Signed)
TOC call attempt, no answer, left message. I will try again later in the week.

## 2015-03-01 NOTE — Progress Notes (Signed)
MSSP follow up call to Krista Lopez who states she just yesterday got home from [facility] from having open heart surgery (Dr. Kennon Rounds?) She says she has follow up to see him on 03/15/15 and a few more times after that. She is aware she does not have a follow up scheduled with a PCP here in town, but she says she will begin working on getting this done. She reports her incision is healing, she has the supplies to change the dressing as directed by her heart doctor. She has my contact number should any questions or concerns come up or if she wants me to help coordinate her a follow up with a PCP here locally. I will check back in about two weeks.

## 2015-03-09 ENCOUNTER — Inpatient Hospital Stay
Admit: 2015-03-09 | Discharge: 2015-03-09 | Disposition: A | Discharge status: Home / Self Care | Payer: MEDICARE | Attending: Emergency Medicine

## 2015-03-09 ENCOUNTER — Emergency Department: Admit: 2015-03-09 | Payer: MEDICARE | Primary: Student in an Organized Health Care Education/Training Program

## 2015-03-09 DIAGNOSIS — R0789 Other chest pain: Secondary | ICD-10-CM

## 2015-03-09 LAB — METABOLIC PANEL, COMPREHENSIVE
A-G Ratio: 1 — ABNORMAL LOW (ref 1.2–3.5)
ALT (SGPT): 19 U/L (ref 12–65)
AST (SGOT): 16 U/L (ref 15–37)
Albumin: 3.2 g/dL — ABNORMAL LOW (ref 3.5–5.0)
Alk. phosphatase: 127 U/L (ref 50–136)
Anion gap: 11 mmol/L (ref 7–16)
BUN: 7 MG/DL (ref 6–23)
Bilirubin, total: 0.3 MG/DL (ref 0.2–1.1)
CO2: 22 mmol/L (ref 21–32)
Calcium: 8.8 MG/DL (ref 8.3–10.4)
Chloride: 108 mmol/L — ABNORMAL HIGH (ref 98–107)
Creatinine: 0.65 MG/DL (ref 0.6–1.0)
GFR est AA: >60 mL/min/{1.73_m2} (ref >60)
GFR est non-AA: >60 mL/min/{1.73_m2} (ref >60)
Globulin: 3.3 g/dL (ref 2.3–3.5)
Glucose: 103 mg/dL — ABNORMAL HIGH (ref 65–100)
Potassium: 3.4 mmol/L — ABNORMAL LOW (ref 3.5–5.1)
Protein, total: 6.5 g/dL (ref 6.3–8.2)
Sodium: 141 mmol/L (ref 136–145)

## 2015-03-09 LAB — CBC WITH AUTOMATED DIFF
ABS. BASOPHILS: 0 10*3/uL (ref 0.0–0.2)
ABS. EOSINOPHILS: 0.6 10*3/uL (ref 0.0–0.8)
ABS. IMM. GRANS.: 0 10*3/uL (ref 0.0–0.5)
ABS. LYMPHOCYTES: 1.5 10*3/uL (ref 0.5–4.6)
ABS. MONOCYTES: 0.7 10*3/uL (ref 0.1–1.3)
ABS. NEUTROPHILS: 3.2 10*3/uL (ref 1.7–8.2)
BASOPHILS: 1 % (ref 0.0–2.0)
EOSINOPHILS: 10 % — ABNORMAL HIGH (ref 0.5–7.8)
HCT: 28.9 % — ABNORMAL LOW (ref 35.8–46.3)
HGB: 9.5 g/dL — ABNORMAL LOW (ref 11.7–15.4)
IMMATURE GRANULOCYTES: 0.2 % (ref 0.0–5.0)
LYMPHOCYTES: 25 % (ref 13–44)
MCH: 28.3 PG (ref 26.1–32.9)
MCHC: 32.9 g/dL (ref 31.4–35.0)
MCV: 86 FL (ref 79.6–97.8)
MONOCYTES: 12 % (ref 4.0–12.0)
MPV: 10.2 FL — ABNORMAL LOW (ref 10.8–14.1)
NEUTROPHILS: 52 % (ref 43–78)
PLATELET: 312 10*3/uL (ref 150–450)
RBC: 3.36 M/uL — ABNORMAL LOW (ref 4.05–5.25)
RDW: 15.3 % — ABNORMAL HIGH (ref 11.9–14.6)
WBC: 5.9 10*3/uL (ref 4.3–11.1)

## 2015-03-09 LAB — PROTHROMBIN TIME + INR
INR: 2.3 — ABNORMAL HIGH (ref 0.9–1.2)
Prothrombin time: 23.9 s — ABNORMAL HIGH (ref 9.6–12.0)

## 2015-03-09 MED ORDER — HEPARIN, PORCINE (PF) 100 UNIT/ML IV SYRINGE
100 unit/mL | INTRAVENOUS | Status: DC | PRN
Start: 2015-03-09 — End: 2015-03-09
  Administered 2015-03-09: 08:00:00

## 2015-03-09 MED ORDER — ONDANSETRON (PF) 4 MG/2 ML INJECTION
4 mg/2 mL | INTRAMUSCULAR | Status: AC
Start: 2015-03-09 — End: 2015-03-09
  Administered 2015-03-09: 08:00:00 via INTRAVENOUS

## 2015-03-09 MED ORDER — HYDROMORPHONE (PF) 1 MG/ML IJ SOLN
1 mg/mL | INTRAMUSCULAR | Status: AC
Start: 2015-03-09 — End: 2015-03-09
  Administered 2015-03-09: 08:00:00 via INTRAVENOUS

## 2015-03-09 MED FILL — MONOJECT PREFILL ADVANCED (PF) 100 UNIT/ML INTRAVENOUS SYRINGE: 100 unit/mL | INTRAVENOUS | Qty: 3

## 2015-03-09 MED FILL — HYDROMORPHONE (PF) 1 MG/ML IJ SOLN: 1 mg/mL | INTRAMUSCULAR | Qty: 1

## 2015-03-09 MED FILL — ONDANSETRON (PF) 4 MG/2 ML INJECTION: 4 mg/2 mL | INTRAMUSCULAR | Qty: 2

## 2015-03-09 NOTE — ED Provider Notes (Addendum)
HPI Comments: 50 year old lady with a complicated medical history including multiple pulmonary embolisms who now presents with concerns about a burning chest pain.  Patient approximately 3 weeks ago underwent a pulmonary endarterectomy for chronic pulmonary embolisms.  She says that she now has a sharp pain when she takes a deep breath.  She denies any specific fevers or chills and says that her incision sites have been healing well.  Overall she says her pain is a 9 out of 10.    Elements of this note were created with speech recognition software.  As such, there may be errors of speech recognition present.    Patient is a 50 y.o. female presenting with shortness of breath. [name]   Shortness of Breath   Pertinent negatives include no fever, no headaches, no rhinorrhea, no sore throat, no cough, no wheezing, no chest pain, no vomiting, no abdominal pain and no rash.        Past Medical History:   Diagnosis Date   ??? Anxiety    ??? Bipolar disorder (HCC)    ??? Calculus of kidney    ??? Depression    ??? Factor II deficiency (HCC)    ??? Factor V Leiden mutation (HCC)    ??? Hematuria, microscopic    ??? Infectious disease 02/2009      Hx MRSA / E.Coli bacteremia, MRSA wound infection 2/2 port   ??? Iron deficiency    ??? Migraines    ??? 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 Restless leg syndrome        Past Surgical History:   Procedure Laterality Date   ??? Cystoscopy  ???     x 13   ??? Lithotripsy  2006   ??? Hx vascular access  July, 2010     Power Port placed in [place], [place]., removed   ??? Hx vascular access Right 2013     pt has current rt subclavian port - placed at [facility]   ??? Hx appendectomy  ???   ??? Hx tonsillectomy     ??? Hx cesarean section     ??? Hx tubal ligation     ??? Hx ovarian cyst removal       on R   ??? Hx orthopaedic       left LE   ??? Hx other surgical  02/13/2015     scar removal from B lung at [facility] History:   Problem Relation Age of Onset   ??? Bleeding Prob Father      Factor V   ??? Ovarian Cancer Mother 58     passed away due to ca   ??? Bipolar Disorder Mother    ??? Kidney Disease Brother      kidney stones   ??? Cancer Maternal Grandmother      metastatic breast CA.       Social History     Social History   ??? Marital status: DIVORCED     Spouse name: N/A   ??? Number of children: N/A   ??? Years of education: N/A     Occupational History   ??? Not on file.     Social History Main Topics   ??? Smoking status: Never Smoker   ??? Smokeless tobacco: Never Used   ??? Alcohol use Yes  Comment: rarely - only drinks 2 times per year   ??? Drug use: No      Comment: narcotic seeking per hospital hx.   ??? Sexual activity: Yes     Partners: Male     Birth control/ protection: Surgical     Other Topics Concern   ??? Not on file     Social History Narrative         ALLERGIES: Ativan [lorazepam]; Codeine; Demerol [meperidine]; Hydrocodone-ibuprofen; Iodinated contrast media - oral and iv dye; Ketorolac tromethamine; Pcn [penicillins]; Pneumococcal vaccine; Pneumovax 23 [pneumococcal 23-val ps vaccine]; and Shellfish containing products    Review of Systems   Constitutional: Negative for chills, diaphoresis and fever.   HENT: Negative for congestion, rhinorrhea and sore throat.    Eyes: Negative for redness and visual disturbance.   Respiratory: Positive for shortness of breath. Negative for cough, chest tightness and wheezing.    Cardiovascular: Negative for chest pain and palpitations.   Gastrointestinal: Negative for abdominal pain, blood in stool, diarrhea, nausea and vomiting.   Endocrine: Negative for polydipsia and polyuria.   Genitourinary: Negative for dysuria and hematuria.   Musculoskeletal: Negative for arthralgias, myalgias and neck stiffness.   Skin: Negative for rash.   Allergic/Immunologic: Negative for environmental allergies and food allergies.   Neurological: Negative for dizziness, weakness and headaches.    Hematological: Negative for adenopathy. Does not bruise/bleed easily.   Psychiatric/Behavioral: Negative for confusion and sleep disturbance. The patient is not nervous/anxious.        Vitals:    03/09/15 0134   BP: 181/77   Pulse: 81   Resp: (!) 32   Temp: 98.5 ??F (36.9 ??C)   SpO2: 98%   Weight: 63.5 kg (140 lb)   Height:  (1.626 m)            Physical Exam   Constitutional: She is oriented to person, place, and time. She appears well-developed and well-nourished.   HENT:   Head: Normocephalic and atraumatic.   Eyes: Conjunctivae and EOM are normal. Pupils are equal, round, and reactive to light.   Neck: Normal range of motion.   Cardiovascular: Normal rate and regular rhythm.    Pulmonary/Chest: Effort normal and breath sounds normal. No respiratory distress. She has no wheezes. She has no rales. She exhibits no tenderness.   Bilateral clear lung fields   Abdominal: Soft. Bowel sounds are normal. There is no rebound and no guarding.   Musculoskeletal: Normal range of motion. She exhibits no edema or tenderness.   Lymphadenopathy:     She has no cervical adenopathy.   Neurological: She is alert and oriented to person, place, and time.   Skin: Skin is warm and dry.   Her sternotomy incision and the drain sites are clean dry and intact   Psychiatric: She has a normal mood and affect.   Nursing note and vitals reviewed.       MDM  Number of Diagnoses or Management Options  Atypical chest pain:   Diagnosis management comments: Patient's chest x-ray is negative.  We will also check basic labs and an INR.  I will try to treat her symptoms.    Patient's chest x-ray and blood work were essentially unremarkable.  She has follow-up with her surgeon on the second.  I did advise her to return emerged department if she gets any worsening symptoms.    ED Course       Procedures

## 2015-03-09 NOTE — ED Notes (Signed)
Pt presents to ER for increased SOB x 2 days, worse throughout Thurs.  Pt was recently DC'd last Wed after having scarring scrapped from B lungs 02-13-15 at [facility].  Pt states that her pain ha s been uncontrolled w/ her last oxycodone at 2100 tonight.

## 2015-03-09 NOTE — ED Notes (Signed)
R power port accessed w/o blood return.  Site flushed well, will reassess after 5-10 min.

## 2015-03-09 NOTE — ED Notes (Signed)
Pt verbalized understanding of discharge instructions, signed form and ambulated out to lobby for waiting cab in nad

## 2015-03-09 NOTE — ED Notes (Signed)
Blood wk obtained x 2 attempts as port continues to fail in regards of blood return, port continues to flush w/o resistance, primary RN notified.

## 2015-03-12 NOTE — Progress Notes (Signed)
This note will not be viewable in MyChart.      ED Discharge 03/09/2015  Patient engaged with RN Case Manager, Gertie Exon. White.    Will forward chart.

## 2015-03-12 NOTE — Progress Notes (Signed)
TOC call to Krista Lopez, no answer, left a message and will try again tomorrow

## 2015-03-21 ENCOUNTER — Emergency Department: Admit: 2015-03-22 | Payer: MEDICARE | Primary: Student in an Organized Health Care Education/Training Program

## 2015-03-21 DIAGNOSIS — R0602 Shortness of breath: Secondary | ICD-10-CM

## 2015-03-21 NOTE — ED Triage Notes (Signed)
Patient presents after she had surgery at [facility] on January 3rd where she states her chest was opened and her " lungs were stripped of scar tissue from history of clots ". She states at this time she has a feeling like she can not catch her breath and that it hurts her lungs to breathe.

## 2015-03-21 NOTE — ED Provider Notes (Signed)
Patient is a 50 y.o. female presenting with chest pain. [name]   Chest Pain    This is a recurrent problem. The current episode started yesterday. The problem has not changed since onset.Duration of episode(s) is 2 days. The problem occurs constantly. The pain is present in the substernal region. The pain is severe. The quality of the pain is described as sharp. The pain does not radiate. The symptoms are aggravated by deep breathing and certain positions. Associated symptoms include shortness of breath. Pertinent negatives include no abdominal pain, no back pain, no cough, no diaphoresis, no fever, no headaches, no leg pain, no lower extremity edema, no nausea, no numbness, no vomiting and no weakness. She has tried nothing for the symptoms. Her past medical history is significant for PE.       Past Medical History:   Diagnosis Date   ??? Anxiety    ??? Bipolar disorder ([place])    ??? Calculus of kidney    ??? Depression    ??? Factor II deficiency ([place])    ??? Factor V Leiden mutation ([place])    ??? Hematuria, microscopic    ??? Infectious disease 02/2009      Hx MRSA / E.Coli bacteremia, MRSA wound infection 2/2 port   ??? Iron deficiency    ??? Migraines    ??? 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 Restless leg syndrome        Past Surgical History:   Procedure Laterality Date   ??? Cystoscopy  ???     x 13   ??? Lithotripsy  2006   ??? Hx vascular access  July, 2010     Power Port placed in [place], [place]., removed   ??? Hx vascular access Right 2013     pt has current rt subclavian port - placed at [facility]   ??? Hx appendectomy  ???   ??? Hx tonsillectomy     ??? Hx cesarean section     ??? Hx tubal ligation     ??? Hx ovarian cyst removal       on R   ??? Hx orthopaedic       left LE   ??? Hx other surgical  02/13/2015     scar removal from B lung at [facility] History:   Problem Relation Age of Onset   ??? Bleeding Prob Father      Factor V    ??? Ovarian Cancer Mother 26     passed away due to ca   ??? Bipolar Disorder Mother    ??? Kidney Disease Brother      kidney stones   ??? Cancer Maternal Grandmother      metastatic breast CA.       Social History     Social History   ??? Marital status: DIVORCED     Spouse name: N/A   ??? Number of children: N/A   ??? Years of education: N/A     Occupational History   ??? Not on file.     Social History Main Topics   ??? Smoking status: Never Smoker   ??? Smokeless tobacco: Never Used   ??? Alcohol use Yes      Comment: rarely - only drinks 2 times per year   ??? Drug use: No      Comment: narcotic seeking  per hospital hx.   ??? Sexual activity: Yes     Partners: Male     Birth control/ protection: Surgical     Other Topics Concern   ??? Not on file     Social History Narrative         ALLERGIES: Ativan [lorazepam]; Codeine; Demerol [meperidine]; Hydrocodone-ibuprofen; Iodinated contrast media - oral and iv dye; Ketorolac tromethamine; Pcn [penicillins]; Pneumococcal vaccine; Pneumovax 23 [pneumococcal 23-val ps vaccine]; and Shellfish containing products    Review of Systems   Constitutional: Negative for chills, diaphoresis and fever.   HENT: Negative for congestion, rhinorrhea and sore throat.    Eyes: Negative for photophobia and redness.   Respiratory: Positive for shortness of breath. Negative for cough.    Cardiovascular: Positive for chest pain. Negative for leg swelling.   Gastrointestinal: Negative for abdominal pain, diarrhea, nausea and vomiting.   Endocrine: Negative for polydipsia and polyuria.   Genitourinary: Negative for dysuria and hematuria.   Musculoskeletal: Negative for back pain and myalgias.   Neurological: Negative for weakness, numbness and headaches.       Vitals:    03/21/15 2035   BP: 129/76   Pulse: 91   Resp: 18   Temp: 98.8 ??F (37.1 ??C)   SpO2: 96%   Weight: 65.3 kg (144 lb)   Height: '5\' 4"'$  (1.626 m)            Physical Exam   Constitutional: She appears well-developed and well-nourished.   HENT:    Mouth/Throat: Oropharynx is clear and moist. No oropharyngeal exudate.   Eyes: Conjunctivae are normal. Pupils are equal, round, and reactive to light.   Neck: Normal range of motion. Neck supple.   Cardiovascular: Normal rate, regular rhythm, normal heart sounds and intact distal pulses.    Pulmonary/Chest: Effort normal and breath sounds normal. She exhibits tenderness (wound well-healed and without sign of infection).   Abdominal: Soft. She exhibits no distension. There is no tenderness. There is no rebound and no guarding.   Musculoskeletal: Normal range of motion.   Skin: Skin is warm and dry.   Nursing note and vitals reviewed.       MDM  Number of Diagnoses or Management Options  Diagnosis management comments: Atypical chest pain.  No tachycardia tachypnea or hypoxia to suggest pulmonary embolism.  Patient is supratherapeutic on her Coumadin.  Doubt PE.  Finding PE would not change current management as she is over anticoagulated.  She is hemodynamically stable.  Discussed with the on-call oncologist, Dr. Christean Leaf who was on-call for the patient's hematoma doctor Dr. Yolanda Bonine.  She will follow-up with him tomorrow or Friday for recheck of INR and further instruction on when and what dose to resume Coumadin.       Amount and/or Complexity of Data Reviewed  Clinical lab tests: ordered and reviewed (Results for orders placed or performed during the hospital encounter of 03/21/15  -CBC WITH AUTOMATED DIFF       Result                                            Value                         Ref Range  WBC                                               4.3                           4.3 - 11.1 K/uL                 RBC                                               3.70 (L)                      4.05 - 5.25 M/uL                HGB                                               10.2 (L)                      11.7 - 15.4 g/dL                 HCT                                               31.7 (L)                      35.8 - 46.3 %                   MCV                                               85.7                          79.6 - 97.8 FL                  MCH                                               27.6                          26.1 - 32.9 PG                  MCHC                                              32.2  31.4 - 35.0 g/dL                RDW                                               14.7 (H)                      11.9 - 14.6 %                   PLATELET                                          252                           150 - 450 K/uL                  MPV                                               11.0                          10.8 - 14.1 FL                  DF                                                AUTOMATED                                                     NEUTROPHILS                                       42 (L)                        43 - 78 %                       LYMPHOCYTES                                       34                            13 - 44 %                       MONOCYTES  12                            4.0 - 12.0 %                    EOSINOPHILS                                       11 (H)                        0.5 - 7.8 %                     BASOPHILS                                         1                             0.0 - 2.0 %                     IMMATURE GRANULOCYTES                             0.2                           0.0 - 5.0 %                     ABS. NEUTROPHILS                                  1.8                           1.7 - 8.2 K/UL                  ABS. LYMPHOCYTES                                  1.4                           0.5 - 4.6 K/UL                  ABS. MONOCYTES                                    0.5                           0.1 - 1.3 K/UL                   ABS. EOSINOPHILS  0.5                           0.0 - 0.8 K/UL                  ABS. BASOPHILS                                    0.0                           0.0 - 0.2 K/UL                  ABS. IMM. GRANS.                                  0.0                           0.0 - 0.5 K/UL             -METABOLIC PANEL, COMPREHENSIVE       Result                                            Value                         Ref Range                       Sodium                                            140                           136 - 145 mmol/L                Potassium                                         3.8                           3.5 - 5.1 mmol/L                Chloride                                          105                           98 - 107 mmol/L                 CO2  25                            21 - 32 mmol/L                  Anion gap                                         10                            7 - 16 mmol/L                   Glucose                                           98                            65 - 100 mg/dL                  BUN                                               7                             6 - 23 MG/DL                    Creatinine                                        0.74                          0.6 - 1.0 MG/DL                 GFR est AA                                        >60                           >60 ml/min/1.65m               GFR est non-AA                                    >60                           >60 ml/min/1.733m              Calcium  9.1                           8.3 - 10.4 MG/DL                Bilirubin, total                                  0.2                           0.2 - 1.1 MG/DL                 ALT (SGPT)                                        18                            12 - 65 U/L                      AST (SGOT)                                        17                            15 - 37 U/L                     Alk. phosphatase                                  132                           50 - 136 U/L                    Protein, total                                    7.0                           6.3 - 8.2 g/dL                  Albumin                                           3.5                           3.5 - 5.0 g/dL                  Globulin  3.5                           2.3 - 3.5 g/dL                  A-G Ratio                                         1.0 (L)                       1.2 - 3.5                  -PROTHROMBIN TIME + INR       Result                                            Value                         Ref Range                       Prothrombin time                                  >63.0 (H)                     9.6 - 12.0 sec                  INR                                               >6.8 (HH)                     0.9 - 1.2                  )  Tests in the radiology section of CPT??: ordered and reviewed (Xr Chest Pa Lat    Result Date: 03/21/2015  Chest 2 view CLINICAL INDICATION: Dyspnea, chest pain acute, prior heart surgery COMPARISON: 03/09/2015 TECHNIQUE: Upright PA and lateral views of the chest FINDINGS: Lung volumes are well inflated.   Cardiomediastinal silhouette and hilar contours within normal limits. Cardiac surgical changes again noted. Right portacatheter remains in place.   The lungs are clear.  No acute osseous abnormalities are seen. Bone density is borderline low. IVC filter is noted in the upper abdomen.     IMPRESSION: No acute disease.     )  Discuss the patient with other providers: yes  Independent visualization of images, tracings, or specimens: yes (EKG reviewed by ER physician.)      ED Course       Procedures

## 2015-03-21 NOTE — Progress Notes (Signed)
MSSP patient that I have tried numerous times to schedule with a PCP to get her treating with one provider consistently. She has no interest in my help. At this time I will plan to graduate the patient based on non compliance with program. If in the future she does need assistance with home needs she has my contact information.

## 2015-03-22 ENCOUNTER — Inpatient Hospital Stay
Admit: 2015-03-22 | Discharge: 2015-03-22 | Disposition: A | Discharge status: Home / Self Care | Payer: MEDICARE | Attending: Emergency Medicine

## 2015-03-22 LAB — EKG, 12 LEAD, INITIAL
Atrial Rate: 78 {beats}/min
Calculated P Axis: 72 degrees
Calculated R Axis: 101 degrees
Calculated T Axis: 81 degrees
P-R Interval: 158 ms
Q-T Interval: 378 ms
QRS Duration: 90 ms
QTC Calculation (Bezet): 430 ms
Ventricular Rate: 78 {beats}/min

## 2015-03-22 LAB — METABOLIC PANEL, COMPREHENSIVE
A-G Ratio: 1 — ABNORMAL LOW (ref 1.2–3.5)
ALT (SGPT): 18 U/L (ref 12–65)
AST (SGOT): 17 U/L (ref 15–37)
Albumin: 3.5 g/dL (ref 3.5–5.0)
Alk. phosphatase: 132 U/L (ref 50–136)
Anion gap: 10 mmol/L (ref 7–16)
BUN: 7 MG/DL (ref 6–23)
Bilirubin, total: 0.2 MG/DL (ref 0.2–1.1)
CO2: 25 mmol/L (ref 21–32)
Calcium: 9.1 MG/DL (ref 8.3–10.4)
Chloride: 105 mmol/L (ref 98–107)
Creatinine: 0.74 MG/DL (ref 0.6–1.0)
GFR est AA: >60 mL/min/{1.73_m2} (ref >60)
GFR est non-AA: >60 mL/min/{1.73_m2} (ref >60)
Globulin: 3.5 g/dL (ref 2.3–3.5)
Glucose: 98 mg/dL (ref 65–100)
Potassium: 3.8 mmol/L (ref 3.5–5.1)
Protein, total: 7 g/dL (ref 6.3–8.2)
Sodium: 140 mmol/L (ref 136–145)

## 2015-03-22 LAB — CBC WITH AUTOMATED DIFF
ABS. BASOPHILS: 0 10*3/uL (ref 0.0–0.2)
ABS. EOSINOPHILS: 0.5 10*3/uL (ref 0.0–0.8)
ABS. IMM. GRANS.: 0 10*3/uL (ref 0.0–0.5)
ABS. LYMPHOCYTES: 1.4 10*3/uL (ref 0.5–4.6)
ABS. MONOCYTES: 0.5 10*3/uL (ref 0.1–1.3)
ABS. NEUTROPHILS: 1.8 10*3/uL (ref 1.7–8.2)
BASOPHILS: 1 % (ref 0.0–2.0)
EOSINOPHILS: 11 % — ABNORMAL HIGH (ref 0.5–7.8)
HCT: 31.7 % — ABNORMAL LOW (ref 35.8–46.3)
HGB: 10.2 g/dL — ABNORMAL LOW (ref 11.7–15.4)
IMMATURE GRANULOCYTES: 0.2 % (ref 0.0–5.0)
LYMPHOCYTES: 34 % (ref 13–44)
MCH: 27.6 PG (ref 26.1–32.9)
MCHC: 32.2 g/dL (ref 31.4–35.0)
MCV: 85.7 FL (ref 79.6–97.8)
MONOCYTES: 12 % (ref 4.0–12.0)
MPV: 11 FL (ref 10.8–14.1)
NEUTROPHILS: 42 % — ABNORMAL LOW (ref 43–78)
PLATELET: 252 10*3/uL (ref 150–450)
RBC: 3.7 M/uL — ABNORMAL LOW (ref 4.05–5.25)
RDW: 14.7 % — ABNORMAL HIGH (ref 11.9–14.6)
WBC: 4.3 10*3/uL (ref 4.3–11.1)

## 2015-03-22 LAB — PROTHROMBIN TIME + INR
INR: >6.8 — CR (ref 0.9–1.2)
Prothrombin time: >63 s — ABNORMAL HIGH (ref 9.6–12.0)

## 2015-03-22 MED ORDER — SODIUM CHLORIDE 0.9 % IJ SYRG
Freq: Three times a day (TID) | INTRAMUSCULAR | Status: DC
Start: 2015-03-22 — End: 2015-03-22

## 2015-03-22 MED ORDER — ONDANSETRON (PF) 4 MG/2 ML INJECTION
4 mg/2 mL | INTRAMUSCULAR | Status: AC
Start: 2015-03-22 — End: 2015-03-21
  Administered 2015-03-22: 03:00:00 via INTRAVENOUS

## 2015-03-22 MED ORDER — SODIUM CHLORIDE 0.9 % IJ SYRG
INTRAMUSCULAR | Status: DC | PRN
Start: 2015-03-22 — End: 2015-03-22

## 2015-03-22 MED ORDER — HYDROMORPHONE (PF) 1 MG/ML IJ SOLN
1 mg/mL | INTRAMUSCULAR | Status: AC
Start: 2015-03-22 — End: 2015-03-21
  Administered 2015-03-22: 03:00:00 via INTRAVENOUS

## 2015-03-22 MED ORDER — HEPARIN, PORCINE (PF) 100 UNIT/ML IV SYRINGE
100 unit/mL | INTRAVENOUS | Status: DC | PRN
Start: 2015-03-22 — End: 2015-03-22
  Administered 2015-03-22: 05:00:00

## 2015-03-22 MED FILL — HYDROMORPHONE (PF) 1 MG/ML IJ SOLN: 1 mg/mL | INTRAMUSCULAR | Qty: 1

## 2015-03-22 MED FILL — MONOJECT PREFILL ADVANCED (PF) 100 UNIT/ML INTRAVENOUS SYRINGE: 100 unit/mL | INTRAVENOUS | Qty: 3

## 2015-03-22 MED FILL — ONDANSETRON (PF) 4 MG/2 ML INJECTION: 4 mg/2 mL | INTRAMUSCULAR | Qty: 2

## 2015-03-23 NOTE — Progress Notes (Signed)
Date/Time of Call:   03/23/2015 12:51 PM   What was the patient seen in the ED for? Patient was seen in ED for diagnosis of: Chest wall Pain. Elevated INR   Does the patient understand his/her diagnosis and/or treatment and what happened during the ED visit?     Spoke with patient who stated understanding of treatment and diagnosis.     Did the patient receive discharge instructions from the ED? Yes discharge instructions received from the ED per patient.       Review any discharge instructions (see notes in [facility]).  Ask patient if they understand these.  Do they have any questions?     Care Coordinator reviewed DC instructions.  Patient stated understanding and asked to for contact information for Mechele Dawley RN CCM.  Provided patient with phn number.  Please note patient was in previously engaged until 03/21/15.  See 03/21/15 cc note.   Were home services ordered (nursing, PT, OT, ST, etc.)? No HH services ordered at d/c.     If so, has the first visit occurred? If not, why? (Assist with coordination of services if necessary.) N/A   Was any DME ordered? No DME ordered at D/C.     If so, has it been received?  If not, why?  (Assist with coordination of arranging DME orders if necessary.) N/A   Complete a review of all medications (new, continued and discontinued meds per the D/C instructions and medication tab in [facility]). Review of medications was completed.  Coumadin stopped at discharge and remains on hold per patient.   Were all new prescriptions filled?  If not, why?  (Assist with obtainment of medications if necessary.) N/A   Does the patient understand the purpose and dosing instructions for all medications?  (If patient has questions, provide explanation and education.) Patient stated understanding and purpose of all medications.   Does the patient have any problems in performing ADL???s? (If patient is unable to perform ADLs ??? what is the limiting factor(s)?  Do they have a  support system that can assist? If no support system is present, discuss possible assistance that they may be able to obtain.) No.  Patient states she is independent with all ADL???s.       Does the patient have all follow-up appointments scheduled?  Has transportation been arranged?  [facility] Pulmonary follow-up should be within 7 days of discharge; all others should have PCP follow-up within 7 days of discharge; follow-ups with other specialists as appropriate or ordered.) Patient states she spoke with Dr.  Anola Gurney and Coumadin remain on hold.  Patient states she does not at this time have an ofv scheduled as she spoke with specialist.   Any other questions or concerns expressed by the patient?     No further needs identified, or questions asked at this time.           TOC Call Completed By: Alcide Goodness, LPN   Care Coordinator  [facility]          This note will not be viewable in MyChart.

## 2015-03-24 ENCOUNTER — Inpatient Hospital Stay
Admit: 2015-03-24 | Discharge: 2015-03-24 | Disposition: A | Discharge status: Home / Self Care | Payer: MEDICARE | Attending: Emergency Medicine

## 2015-03-24 DIAGNOSIS — R0789 Other chest pain: Secondary | ICD-10-CM

## 2015-03-24 LAB — PROTHROMBIN TIME + INR
INR: >6.8 — CR (ref 0.9–1.2)
Prothrombin time: >63 s — ABNORMAL HIGH (ref 9.6–12.0)

## 2015-03-24 LAB — POC TROPONIN: Troponin-I (POC): 0 ng/ml (ref 0.0–0.08)

## 2015-03-24 LAB — PTT: aPTT: 52.8 s — ABNORMAL HIGH (ref 25.3–32.9)

## 2015-03-24 MED ORDER — OXYCODONE-ACETAMINOPHEN 10 MG-325 MG TAB
10-325 mg | Freq: Once | ORAL | Status: AC
Start: 2015-03-24 — End: 2015-03-24
  Administered 2015-03-24: 15:00:00 via ORAL

## 2015-03-24 MED ORDER — HEPARIN, PORCINE (PF) 100 UNIT/ML IV SYRINGE
100 unit/mL | INTRAVENOUS | Status: DC | PRN
Start: 2015-03-24 — End: 2015-03-24
  Administered 2015-03-24: 16:00:00

## 2015-03-24 MED FILL — MONOJECT PREFILL ADVANCED (PF) 100 UNIT/ML INTRAVENOUS SYRINGE: 100 unit/mL | INTRAVENOUS | Qty: 3

## 2015-03-24 MED FILL — OXYCODONE-ACETAMINOPHEN 10 MG-325 MG TAB: 10-325 mg | ORAL | Qty: 1

## 2015-03-24 NOTE — ED Provider Notes (Signed)
HPI Comments: Patient states chest pain. Had an embolectomy at [facility] in January. Cough nonproductive. No fever. No hemoptysis. Her INR was recently too high. States she has been taking Coumadin 4 mg hs. States she was seen her a few days ago. She sees Dr. Anola Gurney. Is supposed to have INR checked on Monday and has appointment with him next week. Dr. Janee Morn is her Pulmonologist.     Patient is a 50 y.o. female presenting with chest pain. The history is provided by the patient and medical records.   Chest Pain    This is a chronic problem. Episode onset: months. The problem occurs daily. Associated with: recent surgery exacerbated. The pain is moderate. The quality of the pain is described as sharp. The pain does not radiate. The symptoms are aggravated by palpation. Associated symptoms include cough. Pertinent negatives include no fever, no irregular heartbeat, no palpitations, no shortness of breath and no sputum production. Her past medical history is significant for PE.Procedural history includes [place] stents.       Past Medical History:   Diagnosis Date   ??? Anxiety    ??? Bipolar disorder (HCC)    ??? Calculus of kidney    ??? Depression    ??? Factor II deficiency (HCC)    ??? Factor V Leiden mutation (HCC)    ??? Hematuria, microscopic    ??? Infectious disease 02/2009      Hx MRSA / E.Coli bacteremia, MRSA wound infection 2/2 port   ??? Iron deficiency    ??? Migraines    ??? 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 Restless leg syndrome        Past Surgical History:   Procedure Laterality Date   ??? Cystoscopy  ???     x 13   ??? Lithotripsy  2006   ??? Hx vascular access  July, 2010     Power Port placed in [place], [place] removed   ??? Hx vascular access Right 2013     pt has current rt subclavian port - placed at [facility]   ??? Hx appendectomy  ???   ??? Hx tonsillectomy     ??? Hx cesarean section     ??? Hx tubal ligation     ??? Hx ovarian cyst removal       on R    ??? Hx orthopaedic       left LE   ??? Hx other surgical  02/13/2015     scar removal from B lung at [facility] History:   Problem Relation Age of Onset   ??? Bleeding Prob Father      Factor V   ??? Ovarian Cancer Mother 66     passed away due to ca   ??? Bipolar Disorder Mother    ??? Kidney Disease Brother      kidney stones   ??? Cancer Maternal Grandmother      metastatic breast CA.       Social History     Social History   ??? Marital status: DIVORCED     Spouse name: N/A   ??? Number of children: N/A   ??? Years of education: N/A     Occupational History   ??? Not on file.     Social History Main Topics   ??? Smoking status: Never Smoker   ??? Smokeless tobacco: Never Used   ???  Alcohol use Yes      Comment: rarely - only drinks 2 times per year   ??? Drug use: No      Comment: narcotic seeking per hospital hx.   ??? Sexual activity: Yes     Partners: Male     Birth control/ protection: Surgical     Other Topics Concern   ??? Not on file     Social History Narrative         ALLERGIES: Ativan [lorazepam]; Codeine; Demerol [meperidine]; Hydrocodone-ibuprofen; Iodinated contrast media - oral and iv dye; Ketorolac tromethamine; Pcn [penicillins]; Pneumococcal vaccine; Pneumovax 23 [pneumococcal 23-val ps vaccine]; and Shellfish containing products    Review of Systems   Constitutional: Negative for appetite change, chills and fever.   HENT: Negative.    Respiratory: Positive for cough. Negative for sputum production and shortness of breath.    Cardiovascular: Positive for chest pain. Negative for palpitations and leg swelling.   Gastrointestinal: Negative.    Genitourinary: Negative.    Hematological:        Clotting disorder       Vitals:    03/24/15 0852 03/24/15 1028 03/24/15 1049   BP: (!) 169/103 111/56 123/59   Pulse: 96 77 81   Resp: 22  16   Temp: 98 ??F (36.7 ??C)  98 ??F (36.7 ??C)   SpO2: 98% 96% 93%   Weight: 65.8 kg (145 lb)     Height: 4' 6.5" (1.384 m)              Physical Exam    Constitutional: She appears well-developed and well-nourished.   HENT:   Head: Atraumatic.   Cardiovascular: Normal rate, regular rhythm and normal heart sounds.    Pulmonary/Chest: Effort normal and breath sounds normal. She exhibits tenderness.   Abdominal: Soft. Bowel sounds are normal. There is no tenderness.   Musculoskeletal: She exhibits no edema.   Skin:   Incision chest healing well with no redness, swelling, drainage.   Nursing note and vitals reviewed.       MDM  ED Course       Procedures    Chest pain - recent embolectomy  Chest incision healing  Care Everywhere - seen last night at [facility] - had CXR, EKG, blood work. Given Morphine 4 mg IV x 2 doses. Benadryl 25 mg. Zofran 4 mg. Vitamin K 5 mg.   Was discharged at 1 am.  Was given a Rx for Percocet 5 # 10.  Has not had the Rx filled.  Received Oxycodone 5 # 120 when discharged from [facility] on 02/27/15.  Labs reviewed. Continue to hold Coumadin  Given Percocet 10 po today  Follow up with PCP, Birdena Jubilee  No Rx given  Instructions discussed with patient

## 2015-03-24 NOTE — ED Notes (Signed)
PMD-Heather Escobel. Hematologist-Dr Sarajane Jews. Pt reports worsening chest pain since Wednesday. Was seen here on Wednesday. Had open heart surgery on Feb 13, 2015 at [facility]. Incision appears well healing. Slight bruising noted around port due to prior access.

## 2015-03-24 NOTE — ED Notes (Signed)
I have reviewed discharge instructions with the patient.  The patient verbalized understanding.

## 2015-03-25 LAB — EKG, 12 LEAD, INITIAL
Atrial Rate: 92 {beats}/min
Calculated P Axis: 72 degrees
Calculated R Axis: 98 degrees
Calculated T Axis: 88 degrees
P-R Interval: 138 ms
Q-T Interval: 346 ms
QRS Duration: 84 ms
QTC Calculation (Bezet): 427 ms
Ventricular Rate: 92 {beats}/min

## 2015-03-26 NOTE — Progress Notes (Signed)
This note will not be viewable in MyChart.      ED Transition of Care Discharge Follow-up Questionnaire   Date/Time of Call:   03/26/2015 10:35 am     What was the patient seen in the ED for?   Patient was in ED for diagnosis of:  Prolonged INR and other chest pain     Does the patient understand his/her diagnosis and/or treatment and what happened during the ED visit?     Patient voiced understanding of diagnosis and /or treatment.     Did the patient receive discharge instructions from the ED?   Yes.  Patient states discharge instructions explained and received before discharge to home.     Review any discharge instructions (see notes in [facility]).  Ask patient if they understand these.  Do they have any questions?     Care Coordinator and patient reviewed discharge instructions per [facility].      Opportunity for questions and clarification provided.     Patient verbalized understanding of instructions.     Were home services ordered (nursing, PT, OT, ST, etc.)? No home services were ordered.     If so, has the first visit occurred? If not, why? (Assist with coordination of services if necessary.)   n/a     Was any DME ordered?   No DME ordered.     If so, has it been received?  If not, why?  (Assist with coordination of arranging DME orders if necessary.)   n/a     Complete a review of all medications (new, continued and discontinued meds per the D/C instructions and medication tab in [facility]).   Care Coordinator and patient reviewed medications per [facility].   Were all new prescriptions filled?  If not, why?  (Assist with obtainment of medications if necessary.)   No new medications were prescribed by ED Care Provider.     Does the patient understand the purpose and dosing instructions for all medications?  (If patient has questions, provide explanation and education.)   Patient voiced understanding of purpose and dosing instructions for all medications.          Does the patient have any problems in performing ADL???s? (If patient is unable to perform ADLs ??? what is the limiting factor(s)?  Do they have a support system that can assist? If no support system is present, discuss possible assistance that they may be able to obtain.)       Patient reports being independent and able to perform ADLs without assistance.    Patient reports having strong support system when/if help is needed.     Does the patient have all follow-up appointments scheduled?  Has transportation been arranged?  [facility]monary follow-up should be within 7 days of discharge; all others should have PCP follow-up within 7   Days of discharge; follow-ups with other specialists as appropriate or ordered.)      Patient encouraged and voiced agreement to schedule ED follow up with PCP within 7 days.    Patient reports she has an appointment this afternoon, 03/26/2015 @ 1:45pm to have her INR rechecked.    Patient encouraged to follow up with Dr. Anola Gurney and Dr. Janee Morn as recommended by ED Care Provider.    Patient has transportation available.   Any other questions or concerns expressed by the patient?     Patient requests to reengage with RN Case Manager, Mechele Dawley.    This Care Coordinator will forward chart and note to RN  Case Production designer, theatre/television/film.     No other needs identified.         TOC Call Completed By:   Maury Dus, RMA  Community Care Coordinator  [facility]

## 2015-03-26 NOTE — Progress Notes (Signed)
This note will not be viewable in MyChart.      Patient requests to reengage with RN Case Manager, Lawson Fiscal white.  Will forward chart/note.

## 2015-04-13 ENCOUNTER — Inpatient Hospital Stay
Admit: 2015-04-13 | Discharge: 2015-04-13 | Disposition: A | Discharge status: Home / Self Care | Payer: MEDICARE | Attending: Emergency Medicine

## 2015-04-13 ENCOUNTER — Emergency Department: Admit: 2015-04-13 | Payer: MEDICARE | Primary: Student in an Organized Health Care Education/Training Program

## 2015-04-13 DIAGNOSIS — N390 Urinary tract infection, site not specified: Secondary | ICD-10-CM

## 2015-04-13 LAB — URINE MICROSCOPIC
Casts: 0 /lpf
Crystals, urine: 0 /LPF
Mucus: 0 /lpf
RBC: >100 /hpf
WBC: >100 /hpf

## 2015-04-13 LAB — METABOLIC PANEL, COMPREHENSIVE
A-G Ratio: 1.1 — ABNORMAL LOW (ref 1.2–3.5)
ALT (SGPT): 25 U/L (ref 12–65)
AST (SGOT): 20 U/L (ref 15–37)
Albumin: 3.5 g/dL (ref 3.5–5.0)
Alk. phosphatase: 120 U/L (ref 50–136)
Anion gap: 9 mmol/L (ref 7–16)
BUN: 11 MG/DL (ref 6–23)
Bilirubin, total: 0.2 MG/DL (ref 0.2–1.1)
CO2: 27 mmol/L (ref 21–32)
Calcium: 9.3 MG/DL (ref 8.3–10.4)
Chloride: 109 mmol/L — ABNORMAL HIGH (ref 98–107)
Creatinine: 0.83 MG/DL (ref 0.6–1.0)
GFR est AA: >60 mL/min/{1.73_m2} (ref >60)
GFR est non-AA: >60 mL/min/{1.73_m2} (ref >60)
Globulin: 3.3 g/dL (ref 2.3–3.5)
Glucose: 97 mg/dL (ref 65–100)
Potassium: 3.6 mmol/L (ref 3.5–5.1)
Protein, total: 6.8 g/dL (ref 6.3–8.2)
Sodium: 145 mmol/L (ref 136–145)

## 2015-04-13 LAB — CBC WITH AUTOMATED DIFF
ABS. BASOPHILS: 0 10*3/uL (ref 0.0–0.2)
ABS. EOSINOPHILS: 0.4 10*3/uL (ref 0.0–0.8)
ABS. IMM. GRANS.: 0 10*3/uL (ref 0.0–0.5)
ABS. LYMPHOCYTES: 1.5 10*3/uL (ref 0.5–4.6)
ABS. MONOCYTES: 0.6 10*3/uL (ref 0.1–1.3)
ABS. NEUTROPHILS: 3.6 10*3/uL (ref 1.7–8.2)
BASOPHILS: 0 % (ref 0.0–2.0)
EOSINOPHILS: 6 % (ref 0.5–7.8)
HCT: 30 % — ABNORMAL LOW (ref 35.8–46.3)
HGB: 9.7 g/dL — ABNORMAL LOW (ref 11.7–15.4)
IMMATURE GRANULOCYTES: 0.3 % (ref 0.0–5.0)
LYMPHOCYTES: 25 % (ref 13–44)
MCH: 27.6 PG (ref 26.1–32.9)
MCHC: 32.3 g/dL (ref 31.4–35.0)
MCV: 85.5 FL (ref 79.6–97.8)
MONOCYTES: 10 % (ref 4.0–12.0)
MPV: 10.8 FL (ref 10.8–14.1)
NEUTROPHILS: 59 % (ref 43–78)
PLATELET: 233 10*3/uL (ref 150–450)
RBC: 3.51 M/uL — ABNORMAL LOW (ref 4.05–5.25)
RDW: 15.2 % — ABNORMAL HIGH (ref 11.9–14.6)
WBC: 6.1 10*3/uL (ref 4.3–11.1)

## 2015-04-13 MED ORDER — SODIUM CHLORIDE 0.9 % IJ SYRG
Freq: Three times a day (TID) | INTRAMUSCULAR | Status: DC
Start: 2015-04-13 — End: 2015-04-13

## 2015-04-13 MED ORDER — OXYCODONE-ACETAMINOPHEN 5 MG-325 MG TAB
5-325 mg | ORAL_TABLET | Freq: Three times a day (TID) | ORAL | 0 refills | Status: DC | PRN
Start: 2015-04-13 — End: 2015-05-18

## 2015-04-13 MED ORDER — SODIUM CHLORIDE 0.9 % IV
Freq: Once | INTRAVENOUS | Status: AC
Start: 2015-04-13 — End: 2015-04-13
  Administered 2015-04-13: 18:00:00 via INTRAVENOUS

## 2015-04-13 MED ORDER — MORPHINE 10 MG/ML INJ SOLUTION
10 mg/ml | INTRAMUSCULAR | Status: AC
Start: 2015-04-13 — End: 2015-04-13
  Administered 2015-04-13: 18:00:00 via INTRAVENOUS

## 2015-04-13 MED ORDER — DIPHENHYDRAMINE HCL 50 MG/ML IJ SOLN
50 mg/mL | Freq: Once | INTRAMUSCULAR | Status: AC
Start: 2015-04-13 — End: 2015-04-13
  Administered 2015-04-13: 18:00:00 via INTRAVENOUS

## 2015-04-13 MED ORDER — PHENAZOPYRIDINE 200 MG TAB
200 mg | ORAL_TABLET | Freq: Three times a day (TID) | ORAL | 0 refills | Status: AC
Start: 2015-04-13 — End: 2015-04-15

## 2015-04-13 MED ORDER — PROCHLORPERAZINE EDISYLATE 5 MG/ML INJECTION
5 mg/mL | INTRAMUSCULAR | Status: AC
Start: 2015-04-13 — End: 2015-04-13
  Administered 2015-04-13: 18:00:00 via INTRAVENOUS

## 2015-04-13 MED ORDER — HEPARIN, PORCINE (PF) 100 UNIT/ML IV SYRINGE
100 unit/mL | INTRAVENOUS | Status: DC | PRN
Start: 2015-04-13 — End: 2015-04-13
  Administered 2015-04-13: 20:00:00

## 2015-04-13 MED ORDER — HYDROMORPHONE (PF) 1 MG/ML IJ SOLN
1 mg/mL | INTRAMUSCULAR | Status: AC
Start: 2015-04-13 — End: 2015-04-13
  Administered 2015-04-13: 19:00:00 via INTRAVENOUS

## 2015-04-13 MED ORDER — CEPHALEXIN 500 MG CAP
500 mg | ORAL_CAPSULE | Freq: Three times a day (TID) | ORAL | 0 refills | Status: AC
Start: 2015-04-13 — End: 2015-04-20

## 2015-04-13 MED ORDER — SODIUM CHLORIDE 0.9 % IJ SYRG
INTRAMUSCULAR | Status: DC | PRN
Start: 2015-04-13 — End: 2015-04-13

## 2015-04-13 MED ORDER — CEFTRIAXONE 1 GRAM SOLUTION FOR INJECTION
1 gram | INTRAMUSCULAR | Status: AC
Start: 2015-04-13 — End: 2015-04-13
  Administered 2015-04-13: 19:00:00 via INTRAVENOUS

## 2015-04-13 MED FILL — DIPHENHYDRAMINE HCL 50 MG/ML IJ SOLN: 50 mg/mL | INTRAMUSCULAR | Qty: 1

## 2015-04-13 MED FILL — PROCHLORPERAZINE EDISYLATE 5 MG/ML INJECTION: 5 mg/mL | INTRAMUSCULAR | Qty: 2

## 2015-04-13 MED FILL — CEFTRIAXONE 1 GRAM SOLUTION FOR INJECTION: 1 gram | INTRAMUSCULAR | Qty: 1

## 2015-04-13 MED FILL — MORPHINE 10 MG/ML IJ SOLN: 10 mg/mL | INTRAMUSCULAR | Qty: 1

## 2015-04-13 MED FILL — HYDROMORPHONE (PF) 1 MG/ML IJ SOLN: 1 mg/mL | INTRAMUSCULAR | Qty: 1

## 2015-04-13 MED FILL — MONOJECT PREFILL ADVANCED (PF) 100 UNIT/ML INTRAVENOUS SYRINGE: 100 unit/mL | INTRAVENOUS | Qty: 3

## 2015-04-13 NOTE — ED Provider Notes (Signed)
Patient is a 50 y.o. female presenting with flank pain. [name]   Flank Pain    This is a recurrent problem. The current episode started 3 to 5 hours ago. The problem has not changed since onset.The problem occurs constantly. Patient reports not work related injury.The pain is present in the right side. The pain does not radiate. The pain is at a severity of 10/10. The pain is severe. Pertinent negatives include no chest pain, no fever, no headaches and no abdominal pain. She has tried nothing for the symptoms.        Past Medical History:   Diagnosis Date   ??? Anxiety    ??? Bipolar disorder ([place])    ??? Calculus of kidney    ??? Depression    ??? Factor II deficiency ([place])    ??? Factor V Leiden mutation ([place])    ??? Hematuria, microscopic    ??? Infectious disease 02/2009     Hx MRSA / E.Coli bacteremia, MRSA wound infection 2/2 port   ??? Iron deficiency    ??? Migraines    ??? 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 Restless leg syndrome        Past Surgical History:   Procedure Laterality Date   ??? CYSTOSCOPY  ???    x 13   ??? HX APPENDECTOMY  ???   ??? HX CESAREAN SECTION     ??? HX ORTHOPAEDIC      left LE   ??? HX OTHER SURGICAL  02/13/2015    scar removal from B lung at [facility]   ??? HX OVARIAN CYST REMOVAL      on R   ??? HX TONSILLECTOMY     ??? HX TUBAL LIGATION     ??? HX VASCULAR ACCESS  July, 2010    Power Port placed in [place], [place]., removed   ??? HX VASCULAR ACCESS Right 2013    pt has current rt subclavian port - placed at [facility]   ??? LITHOTRIPSY  2006         Family History:   Problem Relation Age of Onset   ??? Bleeding Prob Father      Factor V   ??? Ovarian Cancer Mother 64     passed away due to ca   ??? Bipolar Disorder Mother    ??? Kidney Disease Brother      kidney stones   ??? Cancer Maternal Grandmother      metastatic breast CA.       Social History     Social History   ??? Marital status: DIVORCED     Spouse name: N/A    ??? Number of children: N/A   ??? Years of education: N/A     Occupational History   ??? Not on file.     Social History Main Topics   ??? Smoking status: Never Smoker   ??? Smokeless tobacco: Never Used   ??? Alcohol use Yes      Comment: rarely - only drinks 2 times per year   ??? Drug use: No      Comment: narcotic seeking per hospital hx.   ??? Sexual activity: Yes     Partners: Male     Birth control/ protection: Surgical     Other Topics Concern   ??? Not on file     Social History Narrative  ALLERGIES: Ativan [lorazepam]; Codeine; Demerol [meperidine]; Hydrocodone-ibuprofen; Iodinated contrast media - oral and iv dye; Ketorolac tromethamine; Pcn [penicillins]; Pneumococcal vaccine; Pneumovax 23 [pneumococcal 23-val ps vaccine]; and Shellfish containing products    Review of Systems   Constitutional: Negative.  Negative for chills and fever.   HENT: Negative.  Negative for congestion, ear pain, postnasal drip and rhinorrhea.    Eyes: Negative for pain and visual disturbance.   Respiratory: Negative for cough and wheezing.    Cardiovascular: Negative for chest pain and leg swelling.   Gastrointestinal: Negative.  Negative for abdominal distention and abdominal pain.   Endocrine: Negative.  Negative for polydipsia, polyphagia and polyuria.   Genitourinary: Positive for flank pain. Negative for difficulty urinating and frequency.   Musculoskeletal: Negative for arthralgias and myalgias.   Skin: Negative.    Neurological: Negative.  Negative for dizziness and headaches.   Hematological: Negative.        Vitals:    04/13/15 1202 04/13/15 1534   BP: 121/83 118/84   Pulse: 90 85   Resp: 29 18   Temp: 97.7 ??F (36.5 ??C)    SpO2: 97% 98%   Weight: 65.8 kg (145 lb)    Height: '5\' 4"'$  (1.626 m)             Physical Exam   Constitutional: She is oriented to person, place, and time. Vital signs are normal. She appears well-developed and well-nourished.  Non-toxic appearance. She does not have a sickly appearance. She does not appear  ill.   HENT:   Head: Normocephalic and atraumatic.   Cardiovascular: Intact distal pulses.    Pulmonary/Chest: Effort normal.   Abdominal: Soft.   Neurological: She is alert and oriented to person, place, and time.   Skin: Skin is warm and dry.   Psychiatric: She has a normal mood and affect. Her behavior is normal.   Nursing note and vitals reviewed.       MDM  Number of Diagnoses or Management Options  Urinary tract infection without hematuria, site unspecified: new and requires workup  Diagnosis management comments: History of recurrent kidney stones and feels same.  No fever/vomiting.  Follow up already with Urology next week per routine.     Findings c/w infection and no stone.  Return over weekend if not continuing to improve.  Started on antibiotics in ER and pain improved.       Amount and/or Complexity of Data Reviewed  Clinical lab tests: ordered and reviewed (Results for orders placed or performed during the hospital encounter of 04/13/15  -CBC WITH AUTOMATED DIFF       Result                                            Value                         Ref Range                       WBC                                               6.1  4.3 - 11.1 K/uL                 RBC                                               3.51 (L)                      4.05 - 5.25 M/uL                HGB                                               9.7 (L)                       11.7 - 15.4 g/dL                HCT                                               30.0 (L)                      35.8 - 46.3 %                   MCV                                               85.5                          79.6 - 97.8 FL                  MCH                                               27.6                          26.1 - 32.9 PG                  MCHC                                              32.3                          31.4 - 35.0 g/dL                 RDW  15.2 (H)                      11.9 - 14.6 %                   PLATELET                                          233                           150 - 450 K/uL                  MPV                                               10.8                          10.8 - 14.1 FL                  DF                                                AUTOMATED                                                     NEUTROPHILS                                       59                            43 - 78 %                       LYMPHOCYTES                                       25                            13 - 44 %                       MONOCYTES                                         10                            4.0 - 12.0 %  EOSINOPHILS                                       6                             0.5 - 7.8 %                     BASOPHILS                                         0                             0.0 - 2.0 %                     IMMATURE GRANULOCYTES                             0.3                           0.0 - 5.0 %                     ABS. NEUTROPHILS                                  3.6                           1.7 - 8.2 K/UL                  ABS. LYMPHOCYTES                                  1.5                           0.5 - 4.6 K/UL                  ABS. MONOCYTES                                    0.6                           0.1 - 1.3 K/UL                  ABS. EOSINOPHILS                                  0.4                           0.0 - 0.8 K/UL  ABS. BASOPHILS                                    0.0                           0.0 - 0.2 K/UL                  ABS. IMM. GRANS.                                  0.0                           0.0 - 0.5 K/UL             -METABOLIC PANEL, COMPREHENSIVE       Result                                            Value                         Ref Range                        Sodium                                            145                           136 - 145 mmol/L                Potassium                                         3.6                           3.5 - 5.1 mmol/L                Chloride                                          109 (H)                       98 - 107 mmol/L                 CO2                                               27  21 - 32 mmol/L                  Anion gap                                         9                             7 - 16 mmol/L                   Glucose                                           97                            65 - 100 mg/dL                  BUN                                               11                            6 - 23 MG/DL                    Creatinine                                        0.83                          0.6 - 1.0 MG/DL                 GFR est AA                                        >60                           >60 ml/min/1.60m               GFR est non-AA                                    >60                           >60 ml/min/1.759m              Calcium                                           9.3  8.3 - 10.4 MG/DL                Bilirubin, total                                  0.2                           0.2 - 1.1 MG/DL                 ALT (SGPT)                                        25                            12 - 65 U/L                     AST (SGOT)                                        20                            15 - 37 U/L                     Alk. phosphatase                                  120                           50 - 136 U/L                    Protein, total                                    6.8                           6.3 - 8.2 g/dL                  Albumin                                           3.5                           3.5 - 5.0 g/dL                   Globulin                                          3.3  2.3 - 3.5 g/dL                  A-G Ratio                                         1.1 (L)                       1.2 - 3.5                  -URINE MICROSCOPIC       Result                                            Value                         Ref Range                       WBC                                               >100                          0 /hpf                          RBC                                               >100                          0 /hpf                          Epithelial cells                                  10-20                         0 /hpf                          Bacteria                                          4+ (H)                        0 /hpf                          Casts  0                             0 /lpf                          Crystals                                          0                             0 /LPF                          Amorphous Crystals                                1+ (H)                        0                               Mucus                                             0                             0 /lpf                          Other observations                                RESULTS VERIFIED MANUALLY                                )  Tests in the radiology section of CPT??: ordered and reviewed ([place] Retroperitoneum Comp    Result Date: 04/13/2015  RENAL ULTRASOUND. HISTORY: Right flank pain. History of urolithiasis. COMPARISON: None. TECHNIQUE: Direct skin contact sector 3-5 MHz images of the kidneys are obtained. FINDINGS: Renal echogenicity within normal limits, the right kidney is hypoechoic with respect to the liver. Right kidney: 9.2 cm in length. No hydronephrosis. Left kidney: 10.6 cm in length .  No hydronephrosis.  Small shadowing echogenic focus in the lower  pole suggesting a 3 mm stone Limited views of bladder unremarkable. There are bilateral ureteral jets.     IMPRESSION:   Probable 3 mm stone lower pole left kidney. No evidence of obstruction.     )      ED Course       Procedures

## 2015-04-13 NOTE — ED Notes (Signed)
I have reviewed discharge instructions with the patient.  The patient verbalized understanding.

## 2015-04-13 NOTE — ED Triage Notes (Signed)
Patient arrives to the ER via POV. Patient drive herself here. Patient states she feels like she has another kidney stone. Patient states she started having right sided pain early this morning. Patient states she has been nauseated.

## 2015-04-13 NOTE — ED Notes (Signed)
Patient refused lab draw in triage and states she would like her port accessed. Patient states she just urinated and is unable to give urine sample at this time.

## 2015-04-14 NOTE — Progress Notes (Signed)
Care coordinator spoke to patient who indicated she was sick with the flu. She requested care coordinator call back another day. Care coordinator will attempt second outreach to patient. This note will not be viewable in MyChart.

## 2015-04-15 ENCOUNTER — Emergency Department: Admit: 2015-04-15 | Payer: MEDICARE | Primary: Student in an Organized Health Care Education/Training Program

## 2015-04-15 ENCOUNTER — Inpatient Hospital Stay
Admit: 2015-04-15 | Discharge: 2015-04-15 | Disposition: A | Discharge status: Home / Self Care | Payer: MEDICARE | Attending: Emergency Medicine

## 2015-04-15 DIAGNOSIS — R079 Chest pain, unspecified: Secondary | ICD-10-CM

## 2015-04-15 MED ORDER — HEPARIN, PORCINE (PF) 100 UNIT/ML IV SYRINGE
100 unit/mL | Freq: Once | INTRAVENOUS | Status: AC
Start: 2015-04-15 — End: 2015-04-15
  Administered 2015-04-15: 15:00:00

## 2015-04-15 MED ORDER — ACETAMINOPHEN 500 MG TAB
500 mg | ORAL | Status: AC
Start: 2015-04-15 — End: 2015-04-15
  Administered 2015-04-15: 15:00:00 via ORAL

## 2015-04-15 MED FILL — MONOJECT PREFILL ADVANCED (PF) 100 UNIT/ML INTRAVENOUS SYRINGE: 100 unit/mL | INTRAVENOUS | Qty: 3

## 2015-04-15 MED FILL — MAPAP EXTRA STRENGTH 500 MG TABLET: 500 mg | ORAL | Qty: 2

## 2015-04-15 NOTE — ED Notes (Signed)
I have reviewed discharge instructions with the patient.  The patient verbalized understanding.

## 2015-04-15 NOTE — ED Provider Notes (Addendum)
Patient is a 50 y.o. female presenting with chest pain. [name]   Chest Pain (Angina)    This is a new problem. The current episode started 6 to 12 hours ago. The problem has not changed since onset.The problem occurs constantly. The pain is present in the left side. The pain is at a severity of 9/10. The pain is severe. The quality of the pain is described as sharp and stabbing. The pain does not radiate. Pertinent negatives include no abdominal pain, no cough, no dizziness, no exertional chest pressure, no fever, no headaches and no nausea.        Past Medical History:   Diagnosis Date   ??? Anxiety    ??? Bipolar disorder (HCC)    ??? Calculus of kidney    ??? Depression    ??? Factor II deficiency (HCC)    ??? Factor V Leiden mutation (HCC)    ??? Hematuria, microscopic    ??? Infectious disease 02/2009     Hx MRSA / E.Coli bacteremia, MRSA wound infection 2/2 port   ??? Iron deficiency    ??? Migraines    ??? 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 Restless leg syndrome        Past Surgical History:   Procedure Laterality Date   ??? CYSTOSCOPY  ???    x 13   ??? HX APPENDECTOMY  ???   ??? HX CESAREAN SECTION     ??? HX ORTHOPAEDIC      left LE   ??? HX OTHER SURGICAL  02/13/2015    [facility]   ??? HX OVARIAN CYST REMOVAL      on R   ??? HX TONSILLECTOMY     ??? HX TUBAL LIGATION     ??? HX VASCULAR ACCESS  July, 2010    [place]   ??? HX VASCULAR ACCESS Right 2013    pt has current rt subclavian port - [facility]   ??? LITHOTRIPSY  2006         Family History:   Problem Relation Age of Onset   ??? Bleeding Prob Father      Factor V   ??? Ovarian Cancer Mother 326     passed away due to ca   ??? Bipolar Disorder Mother    ??? Kidney Disease Brother      kidney stones   ??? Cancer Maternal Grandmother      metastatic breast CA.       Social History     Social History   ??? Marital status: DIVORCED      Spouse name: N/A   ??? Number of children: N/A   ??? Years of education: N/A     Occupational History   ??? Not on file.     Social History Main Topics   ??? Smoking status: Never Smoker   ??? Smokeless tobacco: Never Used   ??? Alcohol use Yes      Comment: rarely - only drinks 2 times per year   ??? Drug use: No      Comment: narcotic seeking per hospital hx.   ??? Sexual activity: Yes     Partners: Male     Birth control/ protection: Surgical     Other Topics Concern   ??? Not on file     Social  History Narrative         ALLERGIES: Ativan [lorazepam]; Codeine; Demerol [meperidine]; Hydrocodone-ibuprofen; Iodinated contrast media - oral and iv dye; Ketorolac tromethamine; Pcn [penicillins]; Pneumococcal vaccine; Pneumovax 23 [pneumococcal 23-val ps vaccine]; and Shellfish containing products    Review of Systems   Constitutional: Negative.  Negative for chills and fever.   HENT: Negative.  Negative for congestion, ear pain, postnasal drip and rhinorrhea.    Eyes: Negative for pain and visual disturbance.   Respiratory: Negative for cough and wheezing.    Cardiovascular: Positive for chest pain. Negative for leg swelling.   Gastrointestinal: Negative.  Negative for abdominal distention, abdominal pain and nausea.   Endocrine: Negative.  Negative for polydipsia, polyphagia and polyuria.   Genitourinary: Negative.  Negative for difficulty urinating, flank pain and frequency.   Musculoskeletal: Negative.  Negative for arthralgias and myalgias.   Skin: Negative.    Neurological: Negative.  Negative for dizziness and headaches.   Hematological: Negative.        Vitals:    04/15/15 0921   BP: (!) 168/94   Pulse: 87   Resp: 20   Temp: 97.4 ??F (36.3 ??C)   SpO2: 94%   Weight: 65.8 kg (145 lb)   Height:  (1.626 m)            Physical Exam   Constitutional: She is oriented to person, place, and time. She appears well-developed and well-nourished.   HENT:   Head: Normocephalic and atraumatic.   Cardiovascular: Intact distal pulses.     Right side chest port accessed   Pulmonary/Chest: Effort normal.   Abdominal: Soft.   Neurological: She is alert and oriented to person, place, and time.   Skin: Skin is warm and dry.   Psychiatric: She has a normal mood and affect. Her behavior is normal.   Vitals reviewed.       MDM  Number of Diagnoses or Management Options  Diagnosis management comments: States symptoms work her this AM and she came directly here for evaluation.  Familiar to me and saw just 48hrs ago for unrelated complaints.    Electronic medical record reviewed.  This patient appears to be malingering and misleading as she did not mention that she was at another hospital just minutes ago for headache and treated and left abruptly stating that she had to come emergently to our hospital because her son was in a car accident in our department.  This patient appears to be trying to divert medications and will receive no IV medications from this facility at this time.  MSE unremarkable otherwise and patient will be treated and released.  Unfortunately "protocol" tests were placed by a triage nurse, and will be released after normal.        ED Course       Procedures

## 2015-04-15 NOTE — Progress Notes (Signed)
Per chart review patient was discharged from hospital today again. Attempted second outreach to patient. Patient did not answer phone call. Phone rung continuously, unable to LVM. This note will not be viewable in MyChart.

## 2015-04-15 NOTE — Progress Notes (Signed)
Care [facility]. Spoke to patient who explained [facility] is [facility] [place]. Patient explained that she was "sorry for not answering" and "we will get to talk about everything soon." Chart indicates patient was seen at [facility] and another local hospital this weekend. Will close outreach to patient. This note will not be viewable in MyChart.

## 2015-04-16 LAB — CULTURE, URINE

## 2015-04-16 LAB — EKG, 12 LEAD, INITIAL
Atrial Rate: 82 {beats}/min
Calculated P Axis: 72 degrees
Calculated R Axis: 87 degrees
Calculated T Axis: 71 degrees
P-R Interval: 160 ms
Q-T Interval: 394 ms
QRS Duration: 90 ms
QTC Calculation (Bezet): 460 ms
Ventricular Rate: 82 {beats}/min

## 2015-04-16 NOTE — Progress Notes (Signed)
Please note this patient was already outreached related to 3/5 ED D/C.  Please see notes pertaining to this patient from CC dated 04/14/15 and ED D/C notes.  No TOC call completed.          Alcide GoodnessViccarra Coker, LPN/ Care Coordinator  Mobile:[phone]  [address] [facility]  [address], [address] / [place] [id number]  [email or web address] note will not be viewable in MyChart.

## 2015-04-25 ENCOUNTER — Inpatient Hospital Stay
Admit: 2015-04-25 | Discharge: 2015-04-26 | Disposition: A | Discharge status: Home / Self Care | Payer: MEDICARE | Attending: Emergency Medicine

## 2015-04-25 DIAGNOSIS — G43909 Migraine, unspecified, not intractable, without status migrainosus: Secondary | ICD-10-CM

## 2015-04-25 MED ORDER — SODIUM CHLORIDE 0.9% BOLUS IV
0.9 % | Freq: Once | INTRAVENOUS | Status: DC
Start: 2015-04-25 — End: 2015-04-26

## 2015-04-25 NOTE — ED Provider Notes (Signed)
Patient is a 50 y.o. female presenting with headaches. [name]   Headache    This is a recurrent problem. The current episode started more than 2 days ago. The problem occurs constantly. The problem has been gradually worsening. The headache is aggravated by nothing. The pain is located in the left unilateral region. The quality of the pain is described as throbbing. The pain is at a severity of 10/10. Associated symptoms include malaise/fatigue and nausea. Pertinent negatives include no anorexia, no fever, no chest pressure, no near-syncope, no orthopnea, no palpitations, no syncope, no shortness of breath, no weakness, no tingling, no dizziness, no visual change and no vomiting. She has tried rest for the symptoms. The treatment provided no relief.        Past Medical History:   Diagnosis Date   ??? Anxiety    ??? Bipolar disorder ([place])    ??? Calculus of kidney    ??? Depression    ??? Factor II deficiency ([place])    ??? Factor V Leiden mutation ([place])    ??? Hematuria, microscopic    ??? Infectious disease 02/2009     Hx MRSA / E.Coli bacteremia, MRSA wound infection 2/2 port   ??? Iron deficiency    ??? Migraines    ??? 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 Restless leg syndrome        Past Surgical History:   Procedure Laterality Date   ??? CYSTOSCOPY  ???    x 13   ??? HX APPENDECTOMY  ???   ??? HX CESAREAN SECTION     ??? HX ORTHOPAEDIC      left LE   ??? HX OTHER SURGICAL  02/13/2015    [facility]   ??? HX OVARIAN CYST REMOVAL      on R   ??? HX TONSILLECTOMY     ??? HX TUBAL LIGATION     ??? HX VASCULAR ACCESS  July, 2010    Power Port placed in [place], [place] removed   ??? HX VASCULAR ACCESS Right 2013    pt has current rt subclavian port - placed at [facility]   ??? LITHOTRIPSY  2006         Family History:   Problem Relation Age of Onset   ??? Bleeding Prob Father      Factor V   ??? Ovarian Cancer Mother 12     passed away due to ca    ??? Bipolar Disorder Mother    ??? Kidney Disease Brother      kidney stones   ??? Cancer Maternal Grandmother      metastatic breast CA.       Social History     Social History   ??? Marital status: DIVORCED     Spouse name: N/A   ??? Number of children: N/A   ??? Years of education: N/A     Occupational History   ??? Not on file.     Social History Main Topics   ??? Smoking status: Never Smoker   ??? Smokeless tobacco: Never Used   ??? Alcohol use Yes      Comment: rarely - only drinks 2 times per year   ??? Drug use: No      Comment: narcotic seeking per hospital hx.   ??? Sexual activity: Yes     Partners: Male  Birth control/ protection: Surgical     Other Topics Concern   ??? Not on file     Social History Narrative         ALLERGIES: Ativan [lorazepam]; Codeine; Demerol [meperidine]; Hydrocodone-ibuprofen; Iodinated contrast media - oral and iv dye; Ketorolac tromethamine; Pcn [penicillins]; Pneumococcal vaccine; Pneumovax 23 [pneumococcal 23-val ps vaccine]; and Shellfish containing products    Review of Systems   Constitutional: Positive for malaise/fatigue. Negative for chills and fever.   Respiratory: Negative for shortness of breath.    Cardiovascular: Negative for palpitations, orthopnea, syncope and near-syncope.   Gastrointestinal: Positive for nausea. Negative for anorexia and vomiting.   Neurological: Positive for headaches. Negative for dizziness, tingling and weakness.   All other systems reviewed and are negative.      Vitals:    04/25/15 1903   BP: 128/86   Pulse: (!) 105   Resp: 14   Temp: 99.7 ??F (37.6 ??C)   SpO2: 97%   Weight: 65.8 kg (145 lb)   Height: 5' 4.5" (1.638 m)            Physical Exam   Constitutional: She is oriented to person, place, and time. She appears well-developed and well-nourished. She appears distressed.   HENT:   Head: Normocephalic and atraumatic.   Right Ear: Tympanic membrane and external ear normal.   Left Ear: Tympanic membrane and external ear normal.    Mouth/Throat: Oropharynx is clear and moist.   Eyes: Conjunctivae and EOM are normal. Pupils are equal, round, and reactive to light.   Neck: Normal range of motion. Neck supple. No tracheal deviation present.   Cardiovascular: Normal rate, regular rhythm, normal heart sounds and intact distal pulses.  Exam reveals no gallop and no friction rub.    No murmur heard.  Pulmonary/Chest: Effort normal and breath sounds normal. No respiratory distress. She has no wheezes.   Abdominal: Soft. Bowel sounds are normal. She exhibits no distension and no mass. There is no hepatosplenomegaly. There is no tenderness. There is no rebound and no guarding.   Musculoskeletal: Normal range of motion. She exhibits no edema.   Lymphadenopathy:     She has no cervical adenopathy.   Neurological: She is alert and oriented to person, place, and time. She has normal strength. She displays normal reflexes. No cranial nerve deficit or sensory deficit. Coordination normal.   Negative pronator drift, normal finger to nose     Skin: Skin is warm and dry. No rash noted. She is not diaphoretic. No erythema.   Psychiatric: She has a normal mood and affect.   Nursing note and vitals reviewed.       MDM  Number of Diagnoses or Management Options  Migraine without status migrainosus, not intractable, unspecified migraine type: new and requires workup     Amount and/or Complexity of Data Reviewed  Clinical lab tests: ordered and reviewed  Decide to obtain previous medical records or to obtain history from someone other than the patient: yes  Review and summarize past medical records: yes    Risk of Complications, Morbidity, and/or Mortality  Presenting problems: high  Diagnostic procedures: minimal  Management options: moderate    Patient Progress  Patient progress: improved    ED Course       Procedures      The patient was observed in the ED.    Results Reviewed:      Recent Results (from the past 24 hour(s))   CBC WITH AUTOMATED DIFF  Collection Time: 04/25/15  8:08 PM   Result Value Ref Range    WBC 6.0 4.3 - 11.1 K/uL    RBC 3.64 (L) 4.05 - 5.25 M/uL    HGB 9.8 (L) 11.7 - 15.4 g/dL    HCT 31.2 (L) 35.8 - 46.3 %    MCV 85.7 79.6 - 97.8 FL    MCH 26.9 26.1 - 32.9 PG    MCHC 31.4 31.4 - 35.0 g/dL    RDW 14.5 11.9 - 14.6 %    PLATELET 340 150 - 450 K/uL    MPV 10.8 10.8 - 14.1 FL    DF AUTOMATED      NEUTROPHILS 64 43 - 78 %    LYMPHOCYTES 18 13 - 44 %    MONOCYTES 13 (H) 4.0 - 12.0 %    EOSINOPHILS 5 0.5 - 7.8 %    BASOPHILS 0 0.0 - 2.0 %    IMMATURE GRANULOCYTES 0.2 0.0 - 5.0 %    ABS. NEUTROPHILS 3.9 1.7 - 8.2 K/UL    ABS. LYMPHOCYTES 1.1 0.5 - 4.6 K/UL    ABS. MONOCYTES 0.8 0.1 - 1.3 K/UL    ABS. EOSINOPHILS 0.3 0.0 - 0.8 K/UL    ABS. BASOPHILS 0.0 0.0 - 0.2 K/UL    ABS. IMM. GRANS. 0.0 0.0 - 0.5 K/UL   METABOLIC PANEL, COMPREHENSIVE    Collection Time: 04/25/15  8:08 PM   Result Value Ref Range    Sodium 142 136 - 145 mmol/L    Potassium 3.9 3.5 - 5.1 mmol/L    Chloride 105 98 - 107 mmol/L    CO2 26 21 - 32 mmol/L    Anion gap 11 7 - 16 mmol/L    Glucose 111 (H) 65 - 100 mg/dL    BUN 11 6 - 23 MG/DL    Creatinine 0.95 0.6 - 1.0 MG/DL    GFR est AA >60 >60 ml/min/1.69m    GFR est non-AA >60 >60 ml/min/1.74m   Calcium 9.2 8.3 - 10.4 MG/DL    Bilirubin, total 0.3 0.2 - 1.1 MG/DL    ALT (SGPT) 21 12 - 65 U/L    AST (SGOT) 21 15 - 37 U/L    Alk. phosphatase 120 50 - 136 U/L    Protein, total 7.3 6.3 - 8.2 g/dL    Albumin 3.5 3.5 - 5.0 g/dL    Globulin 3.8 (H) 2.3 - 3.5 g/dL    A-G Ratio 0.9 (L) 1.2 - 3.5         I discussed the results of all labs, procedures, radiographs, and treatments with the patient and available family.  Treatment plan is agreed upon and the patient is ready for discharge.  All voiced understanding of the discharge plan and medication instructions or changes as appropriate.  Questions about treatment in the ED were answered.  All were encouraged to return should symptoms worsen or new problems develop.

## 2015-04-25 NOTE — ED Notes (Signed)
Port information documented on wrongperson.

## 2015-04-25 NOTE — ED Notes (Signed)
Pt states pain 4/10 since 2nd dose of pain medicine.

## 2015-04-25 NOTE — ED Notes (Signed)
I have reviewed discharge instructions with the patient.  The patient verbalized understanding.pt left ambultory and called a taxi.

## 2015-04-26 LAB — METABOLIC PANEL, COMPREHENSIVE
A-G Ratio: 0.9 — ABNORMAL LOW (ref 1.2–3.5)
ALT (SGPT): 21 U/L (ref 12–65)
AST (SGOT): 21 U/L (ref 15–37)
Albumin: 3.5 g/dL (ref 3.5–5.0)
Alk. phosphatase: 120 U/L (ref 50–136)
Anion gap: 11 mmol/L (ref 7–16)
BUN: 11 MG/DL (ref 6–23)
Bilirubin, total: 0.3 MG/DL (ref 0.2–1.1)
CO2: 26 mmol/L (ref 21–32)
Calcium: 9.2 MG/DL (ref 8.3–10.4)
Chloride: 105 mmol/L (ref 98–107)
Creatinine: 0.95 MG/DL (ref 0.6–1.0)
GFR est AA: >60 mL/min/{1.73_m2} (ref >60)
GFR est non-AA: >60 mL/min/{1.73_m2} (ref >60)
Globulin: 3.8 g/dL — ABNORMAL HIGH (ref 2.3–3.5)
Glucose: 111 mg/dL — ABNORMAL HIGH (ref 65–100)
Potassium: 3.9 mmol/L (ref 3.5–5.1)
Protein, total: 7.3 g/dL (ref 6.3–8.2)
Sodium: 142 mmol/L (ref 136–145)

## 2015-04-26 LAB — CBC WITH AUTOMATED DIFF
ABS. BASOPHILS: 0 10*3/uL (ref 0.0–0.2)
ABS. EOSINOPHILS: 0.3 10*3/uL (ref 0.0–0.8)
ABS. IMM. GRANS.: 0 10*3/uL (ref 0.0–0.5)
ABS. LYMPHOCYTES: 1.1 10*3/uL (ref 0.5–4.6)
ABS. MONOCYTES: 0.8 10*3/uL (ref 0.1–1.3)
ABS. NEUTROPHILS: 3.9 10*3/uL (ref 1.7–8.2)
BASOPHILS: 0 % (ref 0.0–2.0)
EOSINOPHILS: 5 % (ref 0.5–7.8)
HCT: 31.2 % — ABNORMAL LOW (ref 35.8–46.3)
HGB: 9.8 g/dL — ABNORMAL LOW (ref 11.7–15.4)
IMMATURE GRANULOCYTES: 0.2 % (ref 0.0–5.0)
LYMPHOCYTES: 18 % (ref 13–44)
MCH: 26.9 PG (ref 26.1–32.9)
MCHC: 31.4 g/dL (ref 31.4–35.0)
MCV: 85.7 FL (ref 79.6–97.8)
MONOCYTES: 13 % — ABNORMAL HIGH (ref 4.0–12.0)
MPV: 10.8 FL (ref 10.8–14.1)
NEUTROPHILS: 64 % (ref 43–78)
PLATELET: 340 10*3/uL (ref 150–450)
RBC: 3.64 M/uL — ABNORMAL LOW (ref 4.05–5.25)
RDW: 14.5 % (ref 11.9–14.6)
WBC: 6 10*3/uL (ref 4.3–11.1)

## 2015-04-26 MED ORDER — HEPARIN, PORCINE (PF) 100 UNIT/ML IV SYRINGE
100 unit/mL | INTRAVENOUS | Status: DC | PRN
Start: 2015-04-26 — End: 2015-04-26
  Administered 2015-04-26: 02:00:00

## 2015-04-26 MED ORDER — HYDROMORPHONE (PF) 1 MG/ML IJ SOLN
1 mg/mL | INTRAMUSCULAR | Status: AC
Start: 2015-04-26 — End: 2015-04-25
  Administered 2015-04-26: 01:00:00 via INTRAVENOUS

## 2015-04-26 MED ORDER — ONDANSETRON (PF) 4 MG/2 ML INJECTION
4 mg/2 mL | INTRAMUSCULAR | Status: AC
Start: 2015-04-26 — End: 2015-04-25
  Administered 2015-04-26: 01:00:00 via INTRAVENOUS

## 2015-04-26 MED ORDER — BUTALBITAL-ACETAMINOPHEN-CAFFEINE 50 MG-300 MG-40 MG CAPSULE
50-300-40 mg | ORAL_CAPSULE | ORAL | 0 refills | Status: DC | PRN
Start: 2015-04-26 — End: 2015-05-18

## 2015-04-26 MED ORDER — DEXAMETHASONE SODIUM PHOSPHATE 10 MG/ML IJ SOLN
10 mg/mL | INTRAMUSCULAR | Status: AC
Start: 2015-04-26 — End: 2015-04-25
  Administered 2015-04-26: 01:00:00 via INTRAVENOUS

## 2015-04-26 MED FILL — ONDANSETRON (PF) 4 MG/2 ML INJECTION: 4 mg/2 mL | INTRAMUSCULAR | Qty: 2

## 2015-04-26 MED FILL — DEXAMETHASONE SODIUM PHOSPHATE 10 MG/ML IJ SOLN: 10 mg/mL | INTRAMUSCULAR | Qty: 1

## 2015-04-26 MED FILL — HYDROMORPHONE (PF) 1 MG/ML IJ SOLN: 1 mg/mL | INTRAMUSCULAR | Qty: 1

## 2015-04-26 MED FILL — MONOJECT PREFILL ADVANCED (PF) 100 UNIT/ML INTRAVENOUS SYRINGE: 100 unit/mL | INTRAVENOUS | Qty: 3

## 2015-04-28 NOTE — Progress Notes (Signed)
This note will not be viewable in MyChart.      ED Transition of Care Discharge Follow-up Questionnaire   Date/Time of Call:   04/26/2015 10:23 am     What was the patient seen in the ED for?   Patient was in ED for diagnosis of:   Migraine     Does the patient understand his/her diagnosis and/or treatment and what happened during the ED visit?     Patient voiced understanding of diagnosis and /or treatment.     Did the patient receive discharge instructions from the ED?   Yes.  Patient states discharge instructions explained and received before discharge to home.     Review any discharge instructions (see notes in [facility]).  Ask patient if they understand these.  Do they have any questions?       Care Coordinator and patient reviewed discharge instructions per [facility].      Opportunity for questions and clarification provided.     Patient verbalized understanding of instructions.     Were home services ordered (nursing, PT, OT, ST, etc.)?   No home services were ordered.     If so, has the first visit occurred? If not, why? (Assist with coordination of services if necessary.)   n/a     Was any DME ordered?   No DME ordered     If so, has it been received?  If not, why?  (Assist with coordination of arranging DME orders if necessary.)   n/a     Complete a review of all medications (new, continued and discontinued meds per the D/C instructions and medication tab in [facility]).   Care Coordinator and patient reviewed medications per [facility].   Were all new prescriptions filled?  If not, why?  (Assist with obtainment of medications if necessary.)    Fioricet was prescribed by ED Care Provider and are being taken as ordered.       Does the patient understand the purpose and dosing instructions for all medications?  (If patient has questions, provide explanation and education.)   Patient voiced understanding of purpose and dosing instructions for all medications.          Does the patient have any problems in performing ADL???s? (If patient is unable to perform ADLs ??? what is the limiting factor(s)?  Do they have a support system that can assist? If no support system is present, discuss possible assistance that they may be able to obtain.)       Patient reports being independent and performing ADLs.    Patient declines RN case management at this time.         Does the patient have all follow-up appointments scheduled?  Has transportation been arranged?  [facility] follow-up should be within 7 days of discharge; all others should have PCP follow-up within 7   Days of discharge; follow-ups with other specialists as appropriate or ordered.)      Patient encouraged and voiced agreement to schedule ED visit with PCP within 7 days.    Patient has transportation available.     Any other questions or concerns expressed by the patient?     Patient had no other questions or concerns and was appreciative of call.     No other needs identified.         TOC Call Completed By:   Maury DusLinette Anica Alcaraz, RMA  [facility] Coordinator

## 2015-04-29 ENCOUNTER — Inpatient Hospital Stay
Admit: 2015-04-29 | Discharge: 2015-04-29 | Disposition: A | Discharge status: Home / Self Care | Payer: MEDICARE | Attending: Emergency Medicine

## 2015-04-29 DIAGNOSIS — G43911 Migraine, unspecified, intractable, with status migrainosus: Secondary | ICD-10-CM

## 2015-04-29 MED ORDER — NALBUPHINE 10 MG/ML INJECTION
10 mg/mL | INTRAMUSCULAR | Status: AC
Start: 2015-04-29 — End: 2015-04-29
  Administered 2015-04-29: 22:00:00 via INTRAMUSCULAR

## 2015-04-29 MED FILL — NALBUPHINE 10 MG/ML INJECTION: 10 mg/mL | INTRAMUSCULAR | Qty: 1

## 2015-04-29 NOTE — ED Provider Notes (Signed)
HPI Comments: Patient is a 50 year old female who has multiple medical comorbidities and multiple frequent emergency department visits at multiple different facilities.  Patient is coming in with current headaches.  She states she's had these since she was 17 on the left side and this headache feels similar to her previous headaches.  It started 2 days ago and is associated with vomiting and photophobia.    Patient is a 50 y.o. female presenting with headaches. [name]   Headache    Pertinent negatives include no fever, no palpitations, no shortness of breath, no nausea and no vomiting.        Past Medical History:   Diagnosis Date   ??? Anxiety    ??? Bipolar disorder (HCC)    ??? Calculus of kidney    ??? Depression    ??? Factor II deficiency (HCC)    ??? Factor V Leiden mutation (HCC)    ??? Hematuria, microscopic    ??? Infectious disease 02/2009     Hx MRSA / E.Coli bacteremia, MRSA wound infection 2/2 port   ??? Iron deficiency    ??? Migraines    ??? 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 Restless leg syndrome        Past Surgical History:   Procedure Laterality Date   ??? CYSTOSCOPY  ???    x 13   ??? HX APPENDECTOMY  ???   ??? HX CESAREAN SECTION     ??? HX ORTHOPAEDIC      left LE   ??? HX OTHER SURGICAL  02/13/2015    [facility]   ??? HX OVARIAN CYST REMOVAL      on R   ??? HX TONSILLECTOMY     ??? HX TUBAL LIGATION     ??? HX VASCULAR ACCESS  July, 2010    [place]   ??? HX VASCULAR ACCESS Right 2013    pt has current rt [facility]cl[facility] port [facility]   ??? LITHOTRIPSY  2006         Family History:   Problem Relation Age of Onset   ??? Bleeding Prob Father      Factor V   ??? Ovarian Cancer Mother [id number]     passed away due to ca   ??? Bipolar Disorder Mother    ??? Kidney Disease Brother      kidney stones   ??? Cancer Maternal Grandmother      metastatic breast CA.       Social History      Social History   ??? Marital status: DIVORCED     Spouse name: N/A   ??? Number of children: N/A   ??? Years of education: N/A     Occupational History   ??? Not on file.     Social History Main Topics   ??? Smoking status: Never Smoker   ??? Smokeless tobacco: Never Used   ??? Alcohol use Yes      Comment: rarely - only drinks 2 times per year   ??? Drug use: No      Comment: narcotic seeking per hospital hx.   ??? Sexual activity: Yes     Partners: Male     Birth control/ protection: Surgical     Other Topics Concern   ??? Not on file  Social History Narrative         ALLERGIES: Ativan [lorazepam]; Codeine; Demerol [meperidine]; Hydrocodone-ibuprofen; Iodinated contrast media - oral and iv dye; Ketorolac tromethamine; Pcn [penicillins]; Pneumococcal vaccine; Pneumovax 23 [pneumococcal 23-val ps vaccine]; and Shellfish containing products    Review of Systems   Constitutional: Negative for chills and fever.   Respiratory: Negative for chest tightness, shortness of breath, wheezing and stridor.    Cardiovascular: Negative for chest pain and palpitations.   Gastrointestinal: Negative for abdominal pain, nausea and vomiting.   Skin: Negative.    Neurological: Positive for headaches.   All other systems reviewed and are negative.      Vitals:    04/29/15 1711   BP: 124/67   Pulse: 99   Resp: 16   Temp: 98.8 ??F (37.1 ??C)   SpO2: 97%   Weight: 65.8 kg (145 lb)   Height:  (1.626 m)            Physical Exam   Constitutional: She is oriented to person, place, and time. She appears well-developed and well-nourished. No distress.   HENT:   Head: Normocephalic and atraumatic.   Eyes: Conjunctivae and EOM are normal. Pupils are equal, round, and reactive to light. No scleral icterus.   Neck: Normal range of motion. Neck supple.   Pulmonary/Chest: Effort normal. No stridor. No respiratory distress.   Abdominal: Soft. She exhibits no distension. There is no tenderness. There is no rebound and no guarding.    Neurological: She is alert and oriented to person, place, and time.   No focal weakness   Skin: Skin is warm and dry. No rash noted. She is not diaphoretic. No erythema.   Psychiatric: She has a normal mood and affect. Her behavior is normal.   Nursing note and vitals reviewed.       MDM  Number of Diagnoses or Management Options  Diagnosis management comments: No red flags patient has similar symptoms to previous headaches I am treating symptomatically.    Standley Brooking, MD; 04/29/2015 :35 PM Voice dictation software was used during the making of this note.  This software is not perfect and grammatical and other typographical errors may be present.  This note has not been proofread for errors.  ===================================================================     ED Course       Procedures

## 2015-04-29 NOTE — ED Notes (Signed)
The patient was given their discharge instructions and  was not given prescriptions.   The  patient verbalized understanding and had no additional questions. The patient was alert and was discharged via Ambulatory (waiting on medicaid van), without additional complaints at time of discharge.  No apparent distress noted

## 2015-04-29 NOTE — ED Triage Notes (Signed)
Headache x 1 day.

## 2015-04-30 NOTE — Progress Notes (Signed)
Date/Time of Call:   04/30/2015 3:55 PM   What was the patient seen in the ED for? Patient was seen in ED for diagnosis of: Migraines   Does the patient understand his/her diagnosis and/or treatment and what happened during the ED visit?     Spoke with patient who stated understanding of treatment and diagnosis.     Did the patient receive discharge instructions from the ED? Yes discharge instructions received from the ED per daughter.     Review any discharge instructions (see notes in [facility]).  Ask patient if they understand these.  Do they have any questions?     Patient and Care Coordinator reviewed DC instructions.  Patient stated understanding and no questions asked.      Were home services ordered (nursing, PT, OT, ST, etc.)? No HH services ordered at d/c.     If so, has the first visit occurred? If not, why? (Assist with coordination of services if necessary.) N/A.     Was any DME ordered? No DME ordered at d/c.     If so, has it been received?  If not, why?  (Assist with coordination of arranging DME orders if necessary.) N/A    Complete a review of all medications (new, continued and discontinued meds per the D/C instructions and medication tab in [facility]). Review of medications was completed.     Were all new prescriptions filled?  If not, why?  (Assist with obtainment of medications if necessary.) N/A   Does the patient understand the purpose and dosing instructions for all medications?  (If patient has questions, provide explanation and education.) Patient stated understanding of purpose and instructions for medications.   Does the patient have any problems in performing ADL???s? (If patient is unable to perform ADLs ??? what is the limiting factor(s)?  Do they have a support system that can assist? If no support system is present, discuss possible assistance that they may be able to obtain.) Patient states she is independent with all ADL???s.      Does the patient have all follow-up appointments scheduled?  Has transportation been arranged?  [facility] follow-up should be within 7 days of discharge; all others should have PCP follow-up within 7 days of discharge; follow-ups with other specialists as appropriate or ordered.) Patient states she does not have a PCP, and again is requesting to be engaged with CCM for referral.  Please see all previous documentation.  Will send referral to appropriate CM.   Any other questions or concerns expressed by the patient?     No further questions asked or needs identified at this time.          TOC Call Completed By: Alcide GoodnessViccarra Coker, LPN   Care Coordinator  [facility]          This note will not be viewable in MyChart.

## 2015-05-01 NOTE — Progress Notes (Signed)
TOC sent over from recent ED visit. Patient needs a PCP to follow up with and I have made an appointment for her to be established back at the end of 2016, but she did not attend, not sure of why. I did call [facility] and left a message for Tiffany to call me in order to get her rescheduled. I will contact Ms. Tarlton once this has been scheduled and review plans for her to follow up and assess any home needs.

## 2015-05-06 ENCOUNTER — Emergency Department: Admit: 2015-05-06 | Payer: MEDICARE | Primary: Student in an Organized Health Care Education/Training Program

## 2015-05-06 ENCOUNTER — Inpatient Hospital Stay
Admit: 2015-05-06 | Discharge: 2015-05-06 | Disposition: A | Discharge status: Home / Self Care | Payer: MEDICARE | Attending: Emergency Medicine

## 2015-05-06 DIAGNOSIS — R1011 Right upper quadrant pain: Secondary | ICD-10-CM

## 2015-05-06 LAB — MAGNESIUM: Magnesium: 2 mg/dL (ref 1.8–2.4)

## 2015-05-06 LAB — CBC WITH AUTOMATED DIFF
ABS. BASOPHILS: 0 10*3/uL (ref 0.0–0.2)
ABS. EOSINOPHILS: 0.4 10*3/uL (ref 0.0–0.8)
ABS. IMM. GRANS.: 0 10*3/uL (ref 0.0–0.5)
ABS. LYMPHOCYTES: 1.2 10*3/uL (ref 0.5–4.6)
ABS. MONOCYTES: 0.7 10*3/uL (ref 0.1–1.3)
ABS. NEUTROPHILS: 3.3 10*3/uL (ref 1.7–8.2)
BASOPHILS: 0 % (ref 0.0–2.0)
EOSINOPHILS: 7 % (ref 0.5–7.8)
HCT: 32 % — ABNORMAL LOW (ref 35.8–46.3)
HGB: 10.3 g/dL — ABNORMAL LOW (ref 11.7–15.4)
IMMATURE GRANULOCYTES: 0.2 % (ref 0.0–5.0)
LYMPHOCYTES: 21 % (ref 13–44)
MCH: 26.7 PG (ref 26.1–32.9)
MCHC: 32.2 g/dL (ref 31.4–35.0)
MCV: 82.9 FL (ref 79.6–97.8)
MONOCYTES: 13 % — ABNORMAL HIGH (ref 4.0–12.0)
MPV: 10.4 FL — ABNORMAL LOW (ref 10.8–14.1)
NEUTROPHILS: 59 % (ref 43–78)
PLATELET: 301 10*3/uL (ref 150–450)
RBC: 3.86 M/uL — ABNORMAL LOW (ref 4.05–5.25)
RDW: 14.6 % (ref 11.9–14.6)
WBC: 5.6 10*3/uL (ref 4.3–11.1)

## 2015-05-06 LAB — METABOLIC PANEL, COMPREHENSIVE
A-G Ratio: 1.1 — ABNORMAL LOW (ref 1.2–3.5)
ALT (SGPT): 13 U/L (ref 12–65)
AST (SGOT): 13 U/L — ABNORMAL LOW (ref 15–37)
Albumin: 3.9 g/dL (ref 3.5–5.0)
Alk. phosphatase: 118 U/L (ref 50–136)
Anion gap: 15 mmol/L (ref 7–16)
BUN: 10 MG/DL (ref 6–23)
Bilirubin, total: 0.2 MG/DL (ref 0.2–1.1)
CO2: 20 mmol/L — ABNORMAL LOW (ref 21–32)
Calcium: 9.3 MG/DL (ref 8.3–10.4)
Chloride: 105 mmol/L (ref 98–107)
Creatinine: 0.83 MG/DL (ref 0.6–1.0)
GFR est AA: >60 mL/min/{1.73_m2} (ref >60)
GFR est non-AA: >60 mL/min/{1.73_m2} (ref >60)
Globulin: 3.4 g/dL (ref 2.3–3.5)
Glucose: 118 mg/dL — ABNORMAL HIGH (ref 65–100)
Potassium: 4.2 mmol/L (ref 3.5–5.1)
Protein, total: 7.3 g/dL (ref 6.3–8.2)
Sodium: 140 mmol/L (ref 136–145)

## 2015-05-06 LAB — POC TROPONIN: Troponin-I (POC): 0 ng/ml (ref 0.0–0.08)

## 2015-05-06 LAB — LIPASE: Lipase: 104 U/L (ref 73–393)

## 2015-05-06 LAB — TROPONIN I: Troponin-I, Qt.: <0.04 NG/ML (ref 0.02–0.05)

## 2015-05-06 MED ORDER — DICYCLOMINE 10 MG CAP
10 mg | ORAL | Status: AC
Start: 2015-05-06 — End: 2015-05-06
  Administered 2015-05-06: 17:00:00 via ORAL

## 2015-05-06 MED ORDER — DICYCLOMINE 20 MG TAB
20 mg | ORAL_TABLET | Freq: Four times a day (QID) | ORAL | 0 refills | Status: AC
Start: 2015-05-06 — End: 2015-05-11

## 2015-05-06 MED ORDER — HEPARIN, PORCINE (PF) 100 UNIT/ML IV SYRINGE
100 unit/mL | INTRAVENOUS | Status: DC | PRN
Start: 2015-05-06 — End: 2015-05-06
  Administered 2015-05-06: 20:00:00

## 2015-05-06 MED ORDER — TRAMADOL 50 MG TAB
50 mg | ORAL_TABLET | Freq: Four times a day (QID) | ORAL | 0 refills | Status: DC | PRN
Start: 2015-05-06 — End: 2015-05-18

## 2015-05-06 MED ORDER — PROMETHAZINE 25 MG TAB
25 mg | ORAL_TABLET | Freq: Four times a day (QID) | ORAL | 0 refills | Status: DC | PRN
Start: 2015-05-06 — End: 2015-05-18

## 2015-05-06 MED ORDER — ONDANSETRON (PF) 4 MG/2 ML INJECTION
4 mg/2 mL | INTRAMUSCULAR | Status: AC
Start: 2015-05-06 — End: 2015-05-06
  Administered 2015-05-06: 17:00:00 via INTRAVENOUS

## 2015-05-06 MED ORDER — SODIUM CHLORIDE 0.9% BOLUS IV
0.9 % | Freq: Once | INTRAVENOUS | Status: DC
Start: 2015-05-06 — End: 2015-05-06

## 2015-05-06 MED ORDER — ONDANSETRON (PF) 4 MG/2 ML INJECTION
4 mg/2 mL | INTRAMUSCULAR | Status: DC
Start: 2015-05-06 — End: 2015-05-06

## 2015-05-06 MED ORDER — HYDROMORPHONE (PF) 1 MG/ML IJ SOLN
1 mg/mL | INTRAMUSCULAR | Status: AC
Start: 2015-05-06 — End: 2015-05-06
  Administered 2015-05-06: 17:00:00 via INTRAVENOUS

## 2015-05-06 MED ORDER — SODIUM CHLORIDE 0.9% BOLUS IV
0.9 % | Freq: Once | INTRAVENOUS | Status: AC
Start: 2015-05-06 — End: 2015-05-06
  Administered 2015-05-06: 17:00:00 via INTRAVENOUS

## 2015-05-06 MED FILL — DICYCLOMINE 10 MG CAP: 10 mg | ORAL | Qty: 2

## 2015-05-06 MED FILL — ONDANSETRON (PF) 4 MG/2 ML INJECTION: 4 mg/2 mL | INTRAMUSCULAR | Qty: 2

## 2015-05-06 MED FILL — HYDROMORPHONE (PF) 1 MG/ML IJ SOLN: 1 mg/mL | INTRAMUSCULAR | Qty: 1

## 2015-05-06 MED FILL — MONOJECT PREFILL ADVANCED (PF) 100 UNIT/ML INTRAVENOUS SYRINGE: 100 unit/mL | INTRAVENOUS | Qty: 3

## 2015-05-06 NOTE — ED Notes (Signed)
Lab here to draw blood; pt is awake alert sts pain is 8/10, pt being medicated

## 2015-05-06 NOTE — ED Notes (Signed)
The patient was given their discharge instructions and  was given prescriptions.   The  patient verbalized understanding and had no additional questions. The patient was alert and was discharged via Ambulatory, without additional complaints at time of discharge.  No apparent distress noted

## 2015-05-06 NOTE — ED Provider Notes (Signed)
Patient is a 50 y.o. female presenting with abdominal pain. [name]   Abdominal Pain    This is a new problem. The current episode started 12 to 24 hours ago. The problem occurs constantly. The problem has been gradually worsening. The pain is located in the RUQ. The quality of the pain is aching. The pain is moderate. Associated symptoms include nausea and back pain. Pertinent negatives include no fever, no diarrhea, no vomiting, no dysuria, no frequency, no headaches, no arthralgias and no chest pain. The pain is worsened by eating and activity. The pain is relieved by nothing. Her past medical history is significant for ovarian cysts and kidney stones. The patient's surgical history includes appendectomy.       Past Medical History:   Diagnosis Date   ??? Anxiety    ??? Bipolar disorder (HCC)    ??? Calculus of kidney    ??? Depression    ??? Factor II deficiency (HCC)    ??? Factor V Leiden mutation (HCC)    ??? Hematuria, microscopic    ??? Infectious disease 02/2009     Hx MRSA / E.Coli bacteremia, MRSA wound infection 2/2 port   ??? Iron deficiency    ??? Migraines    ??? 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 Restless leg syndrome        Past Surgical History:   Procedure Laterality Date   ??? CYSTOSCOPY  ???    x 13   ??? HX APPENDECTOMY  ???   ??? HX CESAREAN SECTION     ??? HX ORTHOPAEDIC      left LE   ??? HX OTHER SURGICAL  02/13/2015    scar removal from B lung at [facility]   ??? HX OVARIAN CYST REMOVAL      on R   ??? HX TONSILLECTOMY     ??? HX TUBAL LIGATION     ??? HX VASCULAR ACCESS  July, 2010    Power Port placed in [place], [place] removed   ??? HX VASCULAR ACCESS Right 2013    pt has current rt subclavian port - [facility]   ??? LITHOTRIPSY  2006         Family History:   Problem Relation Age of Onset   ??? Bleeding Prob Father      Factor V   ??? Ovarian Cancer Mother 9     passed away due to ca   ??? Bipolar Disorder Mother     ??? Kidney Disease Brother      kidney stones   ??? Cancer Maternal Grandmother      metastatic breast CA.       Social History     Social History   ??? Marital status: DIVORCED     Spouse name: N/A   ??? Number of children: N/A   ??? Years of education: N/A     Occupational History   ??? Not on file.     Social History Main Topics   ??? Smoking status: Never Smoker   ??? Smokeless tobacco: Never Used   ??? Alcohol use Yes      Comment: rarely - only drinks 2 times per year   ??? Drug use: No      Comment: narcotic seeking per hospital hx.   ??? Sexual activity: Yes     Partners: Male     Birth control/ protection:  Surgical     Other Topics Concern   ??? Not on file     Social History Narrative         ALLERGIES: Ativan [lorazepam]; Codeine; Demerol [meperidine]; Hydrocodone-ibuprofen; Iodinated contrast media - oral and iv dye; Ketorolac tromethamine; Pcn [penicillins]; Pneumococcal vaccine; Pneumovax 23 [pneumococcal 23-val ps vaccine]; and Shellfish containing products    Review of Systems   Constitutional: Negative for chills and fever.   HENT: Negative for rhinorrhea and sore throat.    Eyes: Negative for discharge and redness.   Respiratory: Negative for cough and shortness of breath.    Cardiovascular: Negative for chest pain and palpitations.   Gastrointestinal: Positive for abdominal pain and nausea. Negative for diarrhea and vomiting.   Genitourinary: Negative for difficulty urinating, dysuria and frequency.   Musculoskeletal: Positive for back pain. Negative for arthralgias.   Skin: Negative for rash.   Neurological: Negative for dizziness and headaches.   All other systems reviewed and are negative.      Vitals:    05/06/15 1326 05/06/15 1508 05/06/15 1509 05/06/15 1539   BP:  128/54     Pulse: 78  72 68   Resp:       Temp:       SpO2: 92%  97% 99%   Weight:       Height:                Physical Exam   Constitutional: She is oriented to person, place, and time. She appears  well-developed and well-nourished. She appears distressed.   Crying and sobbing     HENT:   Head: Normocephalic and atraumatic.   Eyes: Conjunctivae are normal. Pupils are equal, round, and reactive to light. Right eye exhibits no discharge. Left eye exhibits no discharge. No scleral icterus.   Neck: Normal range of motion. Neck supple.   Cardiovascular: Normal rate, regular rhythm and normal heart sounds.  Exam reveals no gallop.    No murmur heard.  Pulmonary/Chest: Effort normal and breath sounds normal. No respiratory distress. She has no wheezes. She has no rales.   Abdominal: Soft. Bowel sounds are normal. There is tenderness in the right upper quadrant. There is no guarding.   Musculoskeletal: Normal range of motion. She exhibits no edema.   Neurological: She is alert and oriented to person, place, and time. She exhibits normal muscle tone.   cni 2-12 grossly   Skin: Skin is warm and dry. She is not diaphoretic.   Psychiatric: She has a normal mood and affect. Her behavior is normal.   Nursing note and vitals reviewed.       MDM  Number of Diagnoses or Management Options  RUQ abdominal pain:   Diagnosis management comments: Medical decision making note:  ruq pain,  Diff diagnosis to include gall stone,s cholecystitis,  Perforation,  Cyst,  Diverticulitis,  Labs, us and ct ok  Home with mild medications  This concludes the "medical decision making note" part of this emergency department visit note.      ED Course       Procedures

## 2015-05-06 NOTE — ED Notes (Signed)
Unable to draw blood, lab called

## 2015-05-06 NOTE — ED Notes (Signed)
Report given to Selena BattenErin M

## 2015-05-06 NOTE — ED Triage Notes (Signed)
Pt sts on set of abd last night, c/o nausea no vomiting

## 2015-05-06 NOTE — ED Notes (Signed)
Assumed care of pt from K.Pruitt RN at this time.

## 2015-05-06 NOTE — ED Notes (Signed)
Condition unchanged, pt up to bathroom

## 2015-05-07 NOTE — Progress Notes (Signed)
Notified appropriate CM regarding need for TOC outreach.      Alcide GoodnessViccarra Coker, LPN/ Care Coordinator  [phone]  [address] [facility]  [address], [address] / [place] [id number]  [email or web address] note will not be viewable in MyChart.

## 2015-05-07 NOTE — Progress Notes (Signed)
TOC/MSSP call to Ms.Klepper - completed TOC questionnaire and discussed CM needs. She stated that [facility] which [facility] in January 2017 - but needs a local PCP. [facility] and left a second message with the referral coordinator(ext [phone]) as the front desk says they are not able to schedule anything for me since she will be a new patient there.       Transition of Care Discharge Follow-up Questionnaire   Date/Time of Call:   05/07/2015 3:00p.m.   What was the patient hospitalized for?     Abdominal pain         Does the patient understand his/her diagnosis and/or treatment and what happened during the hospitalization?     yes   Did the patient receive discharge instructions? yes     Review any discharge instructions (see notes in [facility]).  Ask patient if they understand these.  Do they have any questions?     Yes - no questions   Were home services ordered (nursing, PT, OT, ST, etc.)?   no   If so, has the first visit occurred? If not, why? (Assist with coordination of services if necessary.)          Was any DME ordered? no     If so, has it been received?  If not, why?  (Assist with coordination of arranging DME orders if necessary.)          Complete a review of all medications (new, continued and discontinued meds per the D/C instructions and medication tab in [facility]).     reviewed     Were all new prescriptions filled?  If not, why?  (Assist with obtainment of medications if necessary.)     yes     Does the patient understand the purpose and dosing instructions for all medications?  (If patient has questions, provide explanation and education.) yes   Does the patient have any problems in performing ADL???s? (If patient is unable to perform ADLs ??? what is the limiting factor(s)?  Do they have a support system that can assist? If no support system is present, discuss  possible assistance that they may be able to obtain.)   no           Does the patient have all follow-up appointments scheduled?  Has transportation been arranged?  [facility] follow-up should be within 7 days of discharge; all others should have PCP follow-up within 7 days of discharge; follow-ups with other specialists as appropriate or ordered.) Not with local PCP, [facility]   Any other questions or concerns expressed by the patient?     Says no PCP will accept her in our network   [facility] or refer to RN Case Manager/Social Services Case Manager as appropriate. [facility]   [facility] Completed By:   05/07/2015     Patient reports that the Bentyl she was given in ED is helping her stomach pain, she has no immediate needs at this time other than me getting her scheduled with a PCP.

## 2015-05-10 NOTE — Progress Notes (Signed)
Call received back from Rogers Blockeriffany Gilstrap at [facility] and she states that since the patient has had three no shows they are unable to schedule her an appointment at this time there. I will follow up with some other offices to see what I can get. I will continue to contact the patient with updates.

## 2015-05-18 ENCOUNTER — Inpatient Hospital Stay
Admit: 2015-05-18 | Discharge: 2015-05-22 | Disposition: A | Discharge status: Home / Self Care | Payer: MEDICARE | Attending: Internal Medicine | Admitting: Internal Medicine

## 2015-05-18 ENCOUNTER — Emergency Department: Admit: 2015-05-18 | Payer: MEDICARE | Primary: Student in an Organized Health Care Education/Training Program

## 2015-05-18 DIAGNOSIS — I2699 Other pulmonary embolism without acute cor pulmonale: Principal | ICD-10-CM

## 2015-05-18 LAB — CBC WITH AUTOMATED DIFF
ABS. BASOPHILS: 0 10*3/uL (ref 0.0–0.2)
ABS. EOSINOPHILS: 0.6 10*3/uL (ref 0.0–0.8)
ABS. IMM. GRANS.: 0 10*3/uL (ref 0.0–0.5)
ABS. LYMPHOCYTES: 1.6 10*3/uL (ref 0.5–4.6)
ABS. MONOCYTES: 0.5 10*3/uL (ref 0.1–1.3)
ABS. NEUTROPHILS: 2.5 10*3/uL (ref 1.7–8.2)
BASOPHILS: 1 % (ref 0.0–2.0)
EOSINOPHILS: 11 % — ABNORMAL HIGH (ref 0.5–7.8)
HCT: 31.5 % — ABNORMAL LOW (ref 35.8–46.3)
HGB: 10.4 g/dL — ABNORMAL LOW (ref 11.7–15.4)
IMMATURE GRANULOCYTES: 0.2 % (ref 0.0–5.0)
LYMPHOCYTES: 31 % (ref 13–44)
MCH: 26.4 PG (ref 26.1–32.9)
MCHC: 33 g/dL (ref 31.4–35.0)
MCV: 79.9 FL (ref 79.6–97.8)
MONOCYTES: 9 % (ref 4.0–12.0)
MPV: 10.8 FL (ref 10.8–14.1)
NEUTROPHILS: 48 % (ref 43–78)
PLATELET: 333 10*3/uL (ref 150–450)
RBC: 3.94 M/uL — ABNORMAL LOW (ref 4.05–5.25)
RDW: 14.6 % (ref 11.9–14.6)
WBC: 5.2 10*3/uL (ref 4.3–11.1)

## 2015-05-18 LAB — METABOLIC PANEL, COMPREHENSIVE
A-G Ratio: 0.9 — ABNORMAL LOW (ref 1.2–3.5)
ALT (SGPT): 20 U/L (ref 12–65)
AST (SGOT): 10 U/L — ABNORMAL LOW (ref 15–37)
Albumin: 3.7 g/dL (ref 3.5–5.0)
Alk. phosphatase: 132 U/L (ref 50–136)
Anion gap: 10 mmol/L (ref 7–16)
BUN: 14 MG/DL (ref 6–23)
Bilirubin, total: 0.2 MG/DL (ref 0.2–1.1)
CO2: 23 mmol/L (ref 21–32)
Calcium: 8.5 MG/DL (ref 8.3–10.4)
Chloride: 110 mmol/L — ABNORMAL HIGH (ref 98–107)
Creatinine: 0.87 MG/DL (ref 0.6–1.0)
GFR est AA: >60 mL/min/{1.73_m2} (ref >60)
GFR est non-AA: >60 mL/min/{1.73_m2} (ref >60)
Globulin: 4.1 g/dL — ABNORMAL HIGH (ref 2.3–3.5)
Glucose: 94 mg/dL (ref 65–100)
Potassium: 3.5 mmol/L (ref 3.5–5.1)
Protein, total: 7.8 g/dL (ref 6.3–8.2)
Sodium: 143 mmol/L (ref 136–145)

## 2015-05-18 LAB — MIXING STUDY, PT
INR: 5.1 — CR (ref 0.9–1.2)
PT 1:1 mix patient: 12.4 s
PT incubated patient: 13.1 s
Prothrombin time: 55.8 s — ABNORMAL HIGH (ref 9.6–12.0)

## 2015-05-18 LAB — EKG, 12 LEAD, INITIAL
Atrial Rate: 89 {beats}/min
Calculated P Axis: 82 degrees
Calculated R Axis: 98 degrees
Calculated T Axis: 84 degrees
P-R Interval: 152 ms
Q-T Interval: 344 ms
QRS Duration: 90 ms
QTC Calculation (Bezet): 418 ms
Ventricular Rate: 89 {beats}/min

## 2015-05-18 LAB — POC TROPONIN: Troponin-I (POC): 0 ng/ml (ref 0.0–0.08)

## 2015-05-18 LAB — MIXING STUDY, APTT
aPTT 1:1 mix patient: 29.5 s
aPTT incubated patient: 30.6 s
aPTT: 64.1 s — ABNORMAL HIGH (ref 23.5–31.7)

## 2015-05-18 LAB — POC PT/INR
INR (POC): 4.7 — CR (ref 0.9–1.2)
Prothrombin time (POC): 52.2 SECS — ABNORMAL HIGH (ref 9.6–11.6)

## 2015-05-18 LAB — PROTHROMBIN TIME + INR
INR: 5.1 — CR (ref 0.9–1.2)
Prothrombin time: 55.8 s — ABNORMAL HIGH (ref 9.6–12.0)

## 2015-05-18 LAB — TROPONIN I: Troponin-I, Qt.: <0.02 NG/ML — ABNORMAL LOW (ref 0.02–0.05)

## 2015-05-18 MED ORDER — ALBUTEROL SULFATE HFA 90 MCG/ACTUATION AEROSOL INHALER
90 mcg/actuation | RESPIRATORY_TRACT | Status: DC | PRN
Start: 2015-05-18 — End: 2015-05-21

## 2015-05-18 MED ORDER — MORPHINE 2 MG/ML INJECTION
2 mg/mL | Freq: Once | INTRAMUSCULAR | Status: AC
Start: 2015-05-18 — End: 2015-05-18
  Administered 2015-05-18: 21:00:00 via INTRAVENOUS

## 2015-05-18 MED ORDER — HEPARIN (PORCINE) 5,000 UNIT/ML IJ SOLN
5000 unit/mL | INTRAMUSCULAR | Status: AC
Start: 2015-05-18 — End: 2015-05-18
  Administered 2015-05-18: 20:00:00 via INTRAVENOUS

## 2015-05-18 MED ORDER — .PHARMACY TO SUBSTITUTE PER PROTOCOL
Status: DC | PRN
Start: 2015-05-18 — End: 2015-05-18

## 2015-05-18 MED ORDER — DULOXETINE 30 MG CAP, DELAYED RELEASE
30 mg | Freq: Two times a day (BID) | ORAL | Status: DC
Start: 2015-05-18 — End: 2015-05-21
  Administered 2015-05-18 – 2015-05-21 (×9): via ORAL

## 2015-05-18 MED ORDER — MORPHINE 2 MG/ML INJECTION
2 mg/mL | Freq: Once | INTRAMUSCULAR | Status: DC
Start: 2015-05-18 — End: 2015-05-18

## 2015-05-18 MED ORDER — CHOLECALCIFEROL (VITAMIN D3) 400 UNIT (10 MCG) TAB
Freq: Every day | ORAL | Status: DC
Start: 2015-05-18 — End: 2015-05-21
  Administered 2015-05-19 – 2015-05-21 (×3): via ORAL

## 2015-05-18 MED ORDER — NUCLEAR MEDICINE ISOTOPE
Freq: Once | Status: AC
Start: 2015-05-18 — End: 2015-05-18
  Administered 2015-05-18: 15:00:00

## 2015-05-18 MED ORDER — ONDANSETRON (PF) 4 MG/2 ML INJECTION
4 mg/2 mL | INTRAMUSCULAR | Status: AC
Start: 2015-05-18 — End: 2015-05-18
  Administered 2015-05-18: 13:00:00 via INTRAVENOUS

## 2015-05-18 MED ORDER — ZOLPIDEM 5 MG TAB
5 mg | Freq: Every evening | ORAL | Status: DC | PRN
Start: 2015-05-18 — End: 2015-05-21
  Administered 2015-05-19 – 2015-05-21 (×3): via ORAL

## 2015-05-18 MED ORDER — HEPARIN (PORCINE) IN D5W 25,000 UNIT/500 ML IV
25000 unit/500 mL (50 unit/mL) | INTRAVENOUS | Status: DC
Start: 2015-05-18 — End: 2015-05-18

## 2015-05-18 MED ORDER — ACETAMINOPHEN 325 MG TABLET
325 mg | ORAL | Status: DC | PRN
Start: 2015-05-18 — End: 2015-05-21

## 2015-05-18 MED ORDER — PROMETHAZINE 25 MG TAB
25 mg | Freq: Four times a day (QID) | ORAL | Status: DC | PRN
Start: 2015-05-18 — End: 2015-05-21
  Administered 2015-05-18 – 2015-05-21 (×9): via ORAL

## 2015-05-18 MED ORDER — CLINDAMYCIN 150 MG CAP
150 mg | Freq: Three times a day (TID) | ORAL | Status: DC
Start: 2015-05-18 — End: 2015-05-21
  Administered 2015-05-18 – 2015-05-21 (×10): via ORAL

## 2015-05-18 MED ORDER — OXYCODONE 5 MG TAB
5 mg | ORAL | Status: AC
Start: 2015-05-18 — End: 2015-05-18
  Administered 2015-05-18: 13:00:00 via ORAL

## 2015-05-18 MED ORDER — MORPHINE 4 MG/ML SYRINGE
4 mg/mL | INTRAMUSCULAR | Status: AC
Start: 2015-05-18 — End: 2015-05-18
  Administered 2015-05-18: 17:00:00 via INTRAVENOUS

## 2015-05-18 MED ORDER — SODIUM CHLORIDE 0.9 % IV
10 mg/mL | Freq: Once | INTRAVENOUS | Status: AC
Start: 2015-05-18 — End: 2015-05-18
  Administered 2015-05-18: 18:00:00 via INTRAVENOUS

## 2015-05-18 MED ORDER — DIPHENHYDRAMINE HCL 50 MG/ML IJ SOLN
50 mg/mL | Freq: Once | INTRAMUSCULAR | Status: AC
Start: 2015-05-18 — End: 2015-05-18
  Administered 2015-05-18: 21:00:00 via INTRAVENOUS

## 2015-05-18 MED ORDER — BUTALBITAL-ACETAMINOPHEN-CAFFEINE 50 MG-325 MG-40 MG TAB
50-325-40 mg | ORAL | Status: DC | PRN
Start: 2015-05-18 — End: 2015-05-21

## 2015-05-18 MED ORDER — DIPHENHYDRAMINE HCL 50 MG/ML IJ SOLN
50 mg/mL | INTRAMUSCULAR | Status: AC
Start: 2015-05-18 — End: 2015-05-18
  Administered 2015-05-18: 17:00:00 via INTRAVENOUS

## 2015-05-18 MED ORDER — NUCLEAR MEDICINE ISOTOPE
Freq: Once | Status: AC
Start: 2015-05-18 — End: 2015-05-18
  Administered 2015-05-18: 16:00:00

## 2015-05-18 MED ORDER — PHYTONADIONE 10 MG/ML INJECTION
10 mg/mL | INTRAMUSCULAR | Status: DC
Start: 2015-05-18 — End: 2015-05-18

## 2015-05-18 MED ORDER — HEPARIN (PORCINE) IN D5W 25,000 UNIT/500 ML IV
25000 unit/500 mL (50 unit/mL) | INTRAVENOUS | Status: DC
Start: 2015-05-18 — End: 2015-05-20
  Administered 2015-05-18 – 2015-05-20 (×9): via INTRAVENOUS

## 2015-05-18 MED FILL — HEPARIN (PORCINE) IN D5W 25,000 UNIT/500 ML IV: 25000 unit/500 mL (50 unit/mL) | INTRAVENOUS | Qty: 500

## 2015-05-18 MED FILL — DIPHENHYDRAMINE HCL 50 MG/ML IJ SOLN: 50 mg/mL | INTRAMUSCULAR | Qty: 1

## 2015-05-18 MED FILL — OXYCODONE 5 MG TAB: 5 mg | ORAL | Qty: 2

## 2015-05-18 MED FILL — VITAMIN K1 10 MG/ML INJECTION SOLUTION: 10 mg/mL | INTRAMUSCULAR | Qty: 1

## 2015-05-18 MED FILL — CLINDAMYCIN 150 MG CAP: 150 mg | ORAL | Qty: 2

## 2015-05-18 MED FILL — DULOXETINE 30 MG CAP, DELAYED RELEASE: 30 mg | ORAL | Qty: 1

## 2015-05-18 MED FILL — PROMETHAZINE 25 MG TAB: 25 mg | ORAL | Qty: 1

## 2015-05-18 MED FILL — MORPHINE 2 MG/ML INJECTION: 2 mg/mL | INTRAMUSCULAR | Qty: 1

## 2015-05-18 MED FILL — VENTOLIN HFA 90 MCG/ACTUATION AEROSOL INHALER: 90 mcg/actuation | RESPIRATORY_TRACT | Qty: 8

## 2015-05-18 MED FILL — ONDANSETRON (PF) 4 MG/2 ML INJECTION: 4 mg/2 mL | INTRAMUSCULAR | Qty: 2

## 2015-05-18 MED FILL — MORPHINE 4 MG/ML SYRINGE: 4 mg/mL | INTRAMUSCULAR | Qty: 1

## 2015-05-18 MED FILL — HEPARIN (PORCINE) 5,000 UNIT/ML IJ SOLN: 5000 unit/mL | INTRAMUSCULAR | Qty: 1

## 2015-05-18 NOTE — ED Notes (Signed)
Pt requests that we not give heparin gtt if possible. States that because she is a difficult stick and heparin gtt will require q6hr venipuncture she would like to discuss other options with physician. Dr. Irving CopasFinn aware and orders hold heparin until pt speaks with hospitalist.

## 2015-05-18 NOTE — ED Notes (Signed)
Pt given meal tray. Dr. Irving CopasFinn aware and approved this.

## 2015-05-18 NOTE — Progress Notes (Signed)
Nursing reviews allergies, will notify MD at this time

## 2015-05-18 NOTE — ED Provider Notes (Addendum)
HPI Comments: 50 year old female states 12 hour history of left pleuritic chest pain???left anterior and substernal.  Described as knifelike.  No cough.  No fever.  No sputum.  Slight shortness of breath.  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  Assessment numerous PEs in the past.  Evidently had a pulmonary artery embolectomy and endarterectomy 3 months ago.  She had a dental procedure done and has been off the Coumadin for 3 days.    Patient is a 50 y.o. female presenting with chest pain. [name]   Chest Pain (Angina)    This is a new problem. The current episode started 6 to 12 hours ago. The problem has not changed since onset.The problem occurs constantly. The pain is moderate. The quality of the pain is described as pleuritic. The pain does not radiate. Associated symptoms include shortness of breath. Pertinent negatives include no abdominal pain, no back pain, no cough, no diaphoresis, no exertional chest pressure, no fever, no headaches, no irregular heartbeat, no leg pain, no nausea, no near-syncope, no palpitations and no vomiting. She has tried nothing for the symptoms. Her past medical history is significant for DVT and PE.Her past medical history does not include cancer, DM, HTN or CHF. Procedural history includes cardiac catheterization.       Past Medical History:   Diagnosis Date   ??? Anxiety    ??? Bipolar disorder ([place])    ??? Calculus of kidney    ??? Depression    ??? Factor II deficiency ([place])    ??? Factor V Leiden mutation ([place])    ??? Hematuria, microscopic    ??? Infectious disease 02/2009     Hx MRSA / E.Coli bacteremia, MRSA wound infection 2/2 port   ??? Iron deficiency    ??? Migraines    ??? 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 Restless leg syndrome        Past Surgical History:   Procedure Laterality Date   ??? CYSTOSCOPY  ???    x 13   ??? HX APPENDECTOMY  ???   ??? HX CESAREAN SECTION      ??? HX ORTHOPAEDIC      left LE   ??? HX OTHER SURGICAL  02/13/2015    scar removal [facility] B lung at [facility]   ??? HX OVARIAN CYST REMOVAL      on R   ??? HX TONSILLECTOMY     ??? HX TUBAL LIGATION     ??? HX VASCULAR ACCESS  July, 2010    Power Port placed in [place], [place] removed   ??? HX VASCULAR ACCESS Right 2013    pt has current rt subclavian port - placed at [facility]   ??? LITHOTRIPSY  2006         Family History:   Problem Relation Age of Onset   ??? Bleeding Prob Father      Factor V   ??? Ovarian Cancer Mother 32     passed away due to ca   ??? Bipolar Disorder Mother    ??? Kidney Disease Brother      kidney stones   ??? Cancer Maternal Grandmother      metastatic breast CA.       Social History     Social History   ??? Marital status: DIVORCED     Spouse name: N/A   ??? Number of children:  N/A   ??? Years of education: N/A     Occupational History   ??? Not on file.     Social History Main Topics   ??? Smoking status: Never Smoker   ??? Smokeless tobacco: Never Used   ??? Alcohol use Yes      Comment: rarely - only drinks 2 times per year   ??? Drug use: No      Comment: narcotic seeking per hospital hx.   ??? Sexual activity: Yes     Partners: Male     Birth control/ protection: Surgical     Other Topics Concern   ??? Not on file     Social History Narrative         ALLERGIES: Ativan [lorazepam]; Codeine; Demerol [meperidine]; Hydrocodone-ibuprofen; Iodinated contrast media - oral and iv dye; Ketorolac tromethamine; Pcn [penicillins]; Pneumococcal vaccine; Pneumovax 23 [pneumococcal 23-val ps vaccine]; and Shellfish containing products    Review of Systems   Constitutional: Negative for chills, diaphoresis and fever.   Respiratory: Positive for shortness of breath. Negative for cough.    Cardiovascular: Positive for chest pain. Negative for palpitations and near-syncope.   Gastrointestinal: Negative for abdominal pain, diarrhea, nausea and vomiting.   Genitourinary: Negative for dysuria and flank pain.    Musculoskeletal: Negative for back pain and neck pain.   Skin: Negative for color change and rash.   Neurological: Negative for syncope and headaches.   All other systems reviewed and are negative.      There were no vitals filed for this visit.         Physical Exam   Constitutional: She is oriented to person, place, and time. She appears well-developed and well-nourished. No distress.   HENT:   Head: Normocephalic and atraumatic.   Mouth/Throat: Oropharynx is clear and moist. No oropharyngeal exudate.   Eyes: Conjunctivae and EOM are normal. Pupils are equal, round, and reactive to light.   Neck: Normal range of motion. Neck supple.   Cardiovascular: Normal rate, regular rhythm and intact distal pulses.    No murmur heard.  Pulmonary/Chest: Breath sounds normal. No respiratory distress.       Abdominal: Soft. Bowel sounds are normal. She exhibits no mass. There is no tenderness. There is no rebound and no guarding. No hernia.   Neurological: She is alert and oriented to person, place, and time. Gait normal.   Nl speech   Skin: Skin is warm and dry.   Psychiatric: She has a normal mood and affect. Her speech is normal.   Nursing note and vitals reviewed.       MDM  Number of Diagnoses or Management Options  Diagnosis management comments: Chest x-rays is for pneumonia or pneumothorax.  Patient at elevated risk for coronary embolism and off Coumadin.  Patient reportedly has a long history of vomiting nonspecific chest pain and medication seeking.       Amount and/or Complexity of Data Reviewed  Clinical lab tests: ordered and reviewed  Tests in the radiology section of CPT??: ordered and reviewed  Tests in the medicine section of CPT??: ordered and reviewed  Review and summarize past medical records: yes (V/Q scan approximately 2 weeks ago.  Intermediate probability.  Patient treated at [facility] and restarted on Coumadin.)  Independent visualization of images, tracings, or specimens: yes     Risk of Complications, Morbidity, and/or Mortality  Presenting problems: moderate  Diagnostic procedures: low  Management options: moderate  General comments: EKG shows normal sinus rhythm.  No ST-T changes.    Patient Progress  Patient progress: stable    ED Course       Procedures      Results Include:    Recent Results (from the past 24 hour(s))   EKG, 12 LEAD, INITIAL    Collection Time: 05/18/15  8:39 AM   Result Value Ref Range    Ventricular Rate 89 BPM    Atrial Rate 89 BPM    P-R Interval 152 ms    QRS Duration 90 ms    Q-T Interval 344 ms    QTC Calculation (Bezet) 418 ms    Calculated P Axis 82 degrees    Calculated R Axis 98 degrees    Calculated T Axis 84 degrees    Diagnosis       !! AGE AND GENDER SPECIFIC ECG ANALYSIS !!  Normal sinus rhythm  Right atrial enlargement  Rightward axis  Pulmonary disease pattern  Nonspecific ST abnormality  Abnormal ECG  When compared with ECG of 15-Apr-2015 09:26,  No significant change was found  Confirmed by [facility]  MD (UC), MATTHEW G ([id number]) on 05/18/2015 9:19:38 AM     CBC WITH AUTOMATED DIFF    Collection Time: 05/18/15  8:52 AM   Result Value Ref Range    WBC 5.2 4.3 - 11.1 K/uL    RBC 3.94 (L) 4.05 - 5.25 M/uL    HGB 10.4 (L) 11.7 - 15.4 g/dL    HCT 31.5 (L) 35.8 - 46.3 %    MCV 79.9 79.6 - 97.8 FL    MCH 26.4 26.1 - 32.9 PG    MCHC 33.0 31.4 - 35.0 g/dL    RDW 14.6 11.9 - 14.6 %    PLATELET 333 150 - 450 K/uL    MPV 10.8 10.8 - 14.1 FL    DF AUTOMATED      NEUTROPHILS 48 43 - 78 %    LYMPHOCYTES 31 13 - 44 %    MONOCYTES 9 4.0 - 12.0 %    EOSINOPHILS 11 (H) 0.5 - 7.8 %    BASOPHILS 1 0.0 - 2.0 %    IMMATURE GRANULOCYTES 0.2 0.0 - 5.0 %    ABS. NEUTROPHILS 2.5 1.7 - 8.2 K/UL    ABS. LYMPHOCYTES 1.6 0.5 - 4.6 K/UL    ABS. MONOCYTES 0.5 0.1 - 1.3 K/UL    ABS. EOSINOPHILS 0.6 0.0 - 0.8 K/UL    ABS. BASOPHILS 0.0 0.0 - 0.2 K/UL    ABS. IMM. GRANS. 0.0 0.0 - 0.5 K/UL   METABOLIC PANEL, COMPREHENSIVE    Collection Time: 05/18/15  8:52 AM   Result Value Ref Range     Sodium 143 136 - 145 mmol/L    Potassium 3.5 3.5 - 5.1 mmol/L    Chloride 110 (H) 98 - 107 mmol/L    CO2 23 21 - 32 mmol/L    Anion gap 10 7 - 16 mmol/L    Glucose 94 65 - 100 mg/dL    BUN 14 6 - 23 MG/DL    Creatinine 0.87 0.6 - 1.0 MG/DL    GFR est AA >60 >60 ml/min/1.83m    GFR est non-AA >60 >60 ml/min/1.718m   Calcium 8.5 8.3 - 10.4 MG/DL    Bilirubin, total 0.2 0.2 - 1.1 MG/DL    ALT (SGPT) 20 12 - 65 U/L    AST (SGOT) 10 (L) 15 - 37 U/L    Alk. phosphatase 132 50 - 136 U/L  Protein, total 7.8 6.3 - 8.2 g/dL    Albumin 3.7 3.5 - 5.0 g/dL    Globulin 4.1 (H) 2.3 - 3.5 g/dL    A-G Ratio 0.9 (L) 1.2 - 3.5     TROPONIN I    Collection Time: 05/18/15  8:52 AM   Result Value Ref Range    Troponin-I, Qt. <0.02 (L) 0.02 - 0.05 NG/ML   PROTHROMBIN TIME + INR    Collection Time: 05/18/15  8:52 AM   Result Value Ref Range    Prothrombin time 55.8 (H) 9.6 - 12.0 sec    INR 5.1 (HH) 0.9 - 1.2     POC TROPONIN-I    Collection Time: 05/18/15  8:55 AM   Result Value Ref Range    Troponin-I (POC) 0 0.0 - 0.08 ng/ml     Xr Chest Port    Result Date: 05/18/2015  Chest portable CLINICAL INDICATION: Acute chest pain with cough, history of pulmonary embolism and heart surgery COMPARISON: 04/15/2015 TECHNIQUE: single AP portable view chest at 8:30 AM upright FINDINGS: Mediastinal and hilar contours are stable. The right portacatheter remains. There is no evidence of pneumothorax, consolidation, pulmonary edema or pleural effusion.     IMPRESSION: No acute disease.     Nm Lung Perfusion W Vent    Result Date: 05/18/2015  Nuclear Medicine Lung Ventilation/Perfusion Scan Clinical Indication: Acute severe pleuritic chest pain with history of pulmonary embolism and reported CT contrast allergy, factor V blood disorder, previous cardiac surgery Comparison: 11/09/2014 VQ scan, also chest radiograph today Radiopharmaceutical: 37.7 mCi Tc 99 M DTPA inhaled, followed by 6.5 mCi Tc99-m MAA IV. Technique: Initial breath, equilibrium, and washout  ventilation is done with inhaled Tc 99 M DTPA followed by high count planar perfusion imaging with Tc-54mMAA IV.  Findings:  There are multiple bilateral moderate peripheral wedge-shaped filling defects (at least 3 on each side). These have increased since prominence since the prior exam and are suspicious for acute to subacute pulmonary embolism in this setting. Images were reviewed in consultation with colleagues, who agreed.     Impression: High probability for PE.     Spoke with the pulmonologist asked me to call the hematologist.  Hematologist asked to verify the PT/INR.  If still high would give vitamin K, 5 mg daily ??3. and start patient on heparin drip.  We'll consult hospitalist for admission.

## 2015-05-18 NOTE — Progress Notes (Signed)
Pt. C/o sharp left lung pain 9/10. Pt refused Tylenol and requests something stronger. Dr. Nelida GoresStrambu notified. MD. Placing orders for Morphine

## 2015-05-18 NOTE — ED Notes (Signed)
[facility]

## 2015-05-18 NOTE — ED Triage Notes (Signed)
Pt reports chest pain and pain going into her lungs. Pt had open heart surgery in January.

## 2015-05-18 NOTE — Progress Notes (Signed)
Arrives to floor, alert, no SSs distress, nursing speaks with pt regarding options that are needed to treat her disease process and agrees with nursing to allow Heparin drip to be started, denies CP, SOB, will follow up with complete assessment, advised to use BSC and not to ambulate unless emergency

## 2015-05-18 NOTE — ED Notes (Signed)
Pt updated on her V/Q scan that is scheduled. Aware that it may take a while, pt has no complaints at this time and is resting comfortably.

## 2015-05-18 NOTE — Progress Notes (Signed)
Call to pharmacy to clarify new stand up weight, spoke with Brett CanalesSteve in pharmacy

## 2015-05-18 NOTE — Progress Notes (Signed)
MD aware and new meds ordered

## 2015-05-18 NOTE — Progress Notes (Signed)
Spoke with Dr. Nelida GoresStrambu regarding pt. Request for Benadryl 12.5 mg IV with morphine. 25 mg Benadryl PO ordered once. MD dose not want Benadryl with morphine combo to continue and orders received to call if patient is truly allergic to morphine

## 2015-05-18 NOTE — H&P (Signed)
HOSPITALIST H&P/CONSULT  NAME:  Krista Lopez   Age:  50 y.o.  DOB:   04-11-1965   MRN:   [id number]  PCP: Trixie Dredge, MD  Treatment Team: Attending Provider: Dorothyann Peng, MD; Primary Nurse: Johny Chess, RN; Primary Nurse: Lillie Fragmin, RN  HPI:   50 year old Krista Lopez with baseline coagulation enzyme deficiencies presented to the Ed with 12 hour history of left pleuritic chest pain???left anterior and substernal. Described as knifelike. No cough. No fever. No sputum. Slight shortness of breath. 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 She had a dental procedure scheduled and was asked to stop  Her warfarin and has only stopped this for three days when current symptoms set in.   She could not tolerate CT for some reason and had to have V/Q scan that suggest high probability for PE.  Curiously her INR is above 5.ED called her hematologist who advised vitamin k and use of heparin for anticoagulation.  Denies fever,or chills.She still have her dental pain from her upper premolar dental caries.Other medical history is as documented below.    Systemic review: ROS done and is as stated in HPI or otherwise negative  Past Medical History:   Diagnosis Date   ??? Anxiety    ??? Bipolar disorder ([place])    ??? Calculus of kidney    ??? Depression    ??? 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 Hematuria, microscopic    ??? Infectious disease 02/2009     Hx MRSA / E.Coli bacteremia, MRSA wound infection 2/2 port   ??? Iron deficiency    ??? Migraines    ??? 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 Restless leg syndrome       Past Surgical History:   Procedure Laterality Date   ??? CYSTOSCOPY  ???    x 13   ??? HX APPENDECTOMY  ???   ??? HX CESAREAN SECTION      ??? HX ORTHOPAEDIC      left LE   ??? HX OTHER SURGICAL  02/13/2015    scar removal [facility] lung at [facility]   ??? HX OVARIAN CYST REMOVAL      on R   ??? HX TONSILLECTOMY     ??? HX TUBAL LIGATION     ??? HX VASCULAR ACCESS  July, 2010    Power Port placed in [place], [place]., removed   ??? HX VASCULAR ACCESS Right 2013    pt has current rt subclavian port - placed at [facility]   ??? LITHOTRIPSY  2006      Prior to Admission Medications   Prescriptions Last Dose Informant Patient Reported? Taking?   DULoxetine (CYMBALTA) 60 mg capsule   No No   Sig: Take 3 caps every day.  Indications: ANXIETY WITH DEPRESSION   Patient taking differently: 30 mg two (2) times a day. Pt states she take 43m BID  Indications: ANXIETY WITH DEPRESSION   albuterol (PROVENTIL HFA, VENTOLIN HFA, PROAIR HFA) 90 mcg/actuation inhaler   No No   Sig: Take 1 Puff by inhalation every four (4) hours as needed for Wheezing.   butalbital-acetaminophen-caff (FIORICET) 50-300-40 mg per capsule  No No   Sig: Take 1 Cap by mouth every four (4) hours as needed for Pain. Max Daily Amount: 6 Caps.   cholecalciferol (VITAMIN D3) 1,000 unit cap   Yes No   Sig: Take  by mouth daily.   clonazePAM (KLONOPIN) 1 mg tablet   Yes No   Sig: Take 1 mg by mouth three (3) times daily.   ondansetron (ZOFRAN ODT) 8 mg disintegrating tablet   No No   Sig: Take 1 Tab by mouth every twelve (12) hours as needed for Nausea.   pantoprazole (PROTONIX) 40 mg tablet   Yes No   Sig: Take 40 mg by mouth two (2) times a day.   promethazine (PHENERGAN) 25 mg tablet   No No   Sig: Take 1 Tab by mouth every six (6) hours as needed for Nausea.   warfarin (COUMADIN) 3 mg tablet   Yes No   Sig: Take 3 mg by mouth daily. Indications: pt takes 1.5tab = 4.33m   zolpidem CR (AMBIEN CR) 12.5 mg tablet   Yes No   Sig: Take 12.5 mg by mouth nightly as needed for Sleep.      Facility-Administered Medications: None     Allergies   Allergen Reactions   ??? Ativan [Lorazepam] Itching   ??? Codeine Rash    ??? Demerol [Meperidine] Itching     *     ??? Hydrocodone-Ibuprofen Unknown (comments)   ??? Iodinated Contrast Media - Oral And Iv Dye Rash     States also has swelling, and allergic to shellfish  Pt has had IV contrast several times after pre-medication   ??? Ketorolac Tromethamine Rash   ??? Pcn [Penicillins] Hives   ??? Pneumococcal Vaccine Swelling   ??? Pneumovax 23 [Pneumococcal 23-Val Ps Vaccine] Other (comments)     Local reaction   ??? Shellfish Containing Products Rash      Social History   Substance Use Topics   ??? Smoking status: Never Smoker   ??? Smokeless tobacco: Never Used   ??? Alcohol use Yes      Comment: rarely - only drinks 2 times per year      Family History   Problem Relation Age of Onset   ??? Bleeding Prob Father      Factor V   ??? Ovarian Cancer Mother 238    passed away due to ca   ??? Bipolar Disorder Mother    ??? Kidney Disease Brother      kidney stones   ??? Cancer Maternal Grandmother      metastatic breast CA.      Objective:     Visit Vitals   ??? BP 131/68   ??? Pulse 83   ??? Resp 17   ??? Ht 5' 4.5" (1.638 m)   ??? Wt 64.4 kg (142 lb)   ??? LMP 07/28/2004   ??? SpO2 98%   ??? BMI 24 kg/m2        Oxygen Therapy  O2 Sat (%): 98 % (05/18/15 1400)  Pulse via Oximetry: 85 beats per minute (05/18/15 1400)  Physical Exam:  General:    Well developed ,well nourished  Caucasian female in no acute distress.     Head:   Normocephalic, atraumatic,EOMI,PERRLA,Neck supple.  Nose:  Nares normal. No drainage or sinus tenderness.  CHESt:   Symetrical chest movement .Por a cath on right chest wall.Clear to auscultation bilaterally.  No Wheezing or Rhonchi.   Heart:   S1S2 Regular rate  and rhythm,  no murmur, rub or gallop.  Abdomen:   Soft, non-tender. Not distended.  Bowel sounds normal. No masses  Extremities: No cyanosis.  No edema. No clubbing  Skin:     Texture, turgor normal. No rashes or lesions.  Not Jaundiced  Neurologic: Alert and well oriented in all spheres.No focal deficit.   Data Review:    Recent Results (from the past 24 hour(s))   EKG, 12 LEAD, INITIAL    Collection Time: 05/18/15  8:39 AM   Result Value Ref Range    Ventricular Rate 89 BPM    Atrial Rate 89 BPM    P-R Interval 152 ms    QRS Duration 90 ms    Q-T Interval 344 ms    QTC Calculation (Bezet) 418 ms    Calculated P Axis 82 degrees    Calculated R Axis 98 degrees    Calculated T Axis 84 degrees    Diagnosis       !! AGE AND GENDER SPECIFIC ECG ANALYSIS !!  Normal sinus rhythm  Right atrial enlargement  Rightward axis  Pulmonary disease pattern  Nonspecific ST abnormality  Abnormal ECG  When compared with ECG of 15-Apr-2015 09:26,  No significant change was found  Confirmed by [facility]  MD (UC), MATTHEW G ([id number]) on 05/18/2015 9:19:38 AM     CBC WITH AUTOMATED DIFF    Collection Time: 05/18/15  8:52 AM   Result Value Ref Range    WBC 5.2 4.3 - 11.1 K/uL    RBC 3.94 (L) 4.05 - 5.25 M/uL    HGB 10.4 (L) 11.7 - 15.4 g/dL    HCT 31.5 (L) 35.8 - 46.3 %    MCV 79.9 79.6 - 97.8 FL    MCH 26.4 26.1 - 32.9 PG    MCHC 33.0 31.4 - 35.0 g/dL    RDW 14.6 11.9 - 14.6 %    PLATELET 333 150 - 450 K/uL    MPV 10.8 10.8 - 14.1 FL    DF AUTOMATED      NEUTROPHILS 48 43 - 78 %    LYMPHOCYTES 31 13 - 44 %    MONOCYTES 9 4.0 - 12.0 %    EOSINOPHILS 11 (H) 0.5 - 7.8 %    BASOPHILS 1 0.0 - 2.0 %    IMMATURE GRANULOCYTES 0.2 0.0 - 5.0 %    ABS. NEUTROPHILS 2.5 1.7 - 8.2 K/UL    ABS. LYMPHOCYTES 1.6 0.5 - 4.6 K/UL    ABS. MONOCYTES 0.5 0.1 - 1.3 K/UL    ABS. EOSINOPHILS 0.6 0.0 - 0.8 K/UL    ABS. BASOPHILS 0.0 0.0 - 0.2 K/UL    ABS. IMM. GRANS. 0.0 0.0 - 0.5 K/UL   METABOLIC PANEL, COMPREHENSIVE    Collection Time: 05/18/15  8:52 AM   Result Value Ref Range    Sodium 143 136 - 145 mmol/L    Potassium 3.5 3.5 - 5.1 mmol/L    Chloride 110 (H) 98 - 107 mmol/L    CO2 23 21 - 32 mmol/L    Anion gap 10 7 - 16 mmol/L    Glucose 94 65 - 100 mg/dL    BUN 14 6 - 23 MG/DL    Creatinine 0.87 0.6 - 1.0 MG/DL    GFR est AA >60 >60 ml/min/1.88m     GFR est non-AA >60 >60 ml/min/1.753m   Calcium 8.5 8.3 - 10.4 MG/DL    Bilirubin, total 0.2 0.2 - 1.1 MG/DL  ALT (SGPT) 20 12 - 65 U/L    AST (SGOT) 10 (L) 15 - 37 U/L    Alk. phosphatase 132 50 - 136 U/L    Protein, total 7.8 6.3 - 8.2 g/dL    Albumin 3.7 3.5 - 5.0 g/dL    Globulin 4.1 (H) 2.3 - 3.5 g/dL    A-G Ratio 0.9 (L) 1.2 - 3.5     TROPONIN I    Collection Time: 05/18/15  8:52 AM   Result Value Ref Range    Troponin-I, Qt. <0.02 (L) 0.02 - 0.05 NG/ML   PROTHROMBIN TIME + INR    Collection Time: 05/18/15  8:52 AM   Result Value Ref Range    Prothrombin time 55.8 (H) 9.6 - 12.0 sec    INR 5.1 (HH) 0.9 - 1.2     POC TROPONIN-I    Collection Time: 05/18/15  8:55 AM   Result Value Ref Range    Troponin-I (POC) 0 0.0 - 0.08 ng/ml   POC PT/INR    Collection Time: 05/18/15 12:35 PM   Result Value Ref Range    Prothrombin time (POC) 52.2 (H) 9.6 - 11.6 SECS    INR (POC) 4.7 (HH) 0.9 - 1.2           Assessment and Plan:   -PLEURITIC CHEST PAIN AND SOB IN A PATIENT WITH COAGULATION FACTOR DEFICIENCIE.  - BILATERAL PULMONARY EMBOLISM  -FACTOR V LIDEN AND FACTOR 2 DEFICIENCIES  -ANEMIA  -COAGULOPATHY  ADMITTED TO MEDICAL FLOOR  -IV HEPARIN-STARTED IN ER  -ANALGESICS  -EMPIRIC ANTIBIOTICS FOR DENTAL CARIES TILL SHE CAN GET HER TOOTH EXTRACTION  -HOME MEDS AS RECONCILED.      Harlene Ramus, MD

## 2015-05-18 NOTE — Other (Signed)
Shift assessment completed. Patient alert and oriented x 4. Heparin drip infusing at 18 units/kg/hr. Pt. on RA. Respirations even and unlabored. No acute distress noted. Bed low, locked, call bell within reach. Side rails up x 2.

## 2015-05-18 NOTE — Progress Notes (Signed)
Admission database complete. Prior to admission medication list given by patient,  cymbalta and klonopin dosages [facility]. Patient/family oriented to room and call system. SBAR handoff given to primary nurse.

## 2015-05-18 NOTE — Progress Notes (Signed)
Nursing goes to rm to dose with meds, pt very talkative, nervous, states "this is the same room that my MaMa  Died in!"then pt clarifys, my "gramdmother", then pt immediately returns to conversation and very anxiously states, Im allergic to ALL seafood, you need to take this out now!", then nursing calls desk to request dietary to bring pt another meal, and admission nurse states slhe has already called, nursing removed plate from table, dietary then calls to discuss pts preference of food replacement and pt tellls dietary "you have what? Well yes, I will take tuna"  And dietary states that they will bring up for pt, compleded skin  Assessment per primary RN and Artist PaisShawna RN, noted healed sternal [facility], skin otherwise healthy, intact, R life port accessed and patent with + blood return, morphine 2mg s/benadryl 12.5mg s  One time dose IV with phenergan 25mg s  Po for co nausea and states "bad headache"

## 2015-05-18 NOTE — Other (Signed)
TRANSFER - IN REPORT:    Verbal report received from CentrahomaJamie, Charity fundraiserN (name) on [facility]  being received from ER(unit) for routine progression of care      Report consisted of patient???s Situation, Background, Assessment and   Recommendations(SBAR).     Information from the following report(s) SBAR, Kardex, [facility] and Recent Results was reviewed with the receiving nurse.    Opportunity for questions and clarification was provided.      Report passed to Leeroy Bockonna Atwell, RN, primary nurse, receiving this pt.

## 2015-05-18 NOTE — ED Notes (Signed)
TRANSFER - OUT REPORT:    Verbal report given to Lawson FiscalLori, Charity fundraiserN (name) on Ron AgeeShannon Ewer  being transferred to 8th floor (unit) for routine progression of care       Report consisted of patient???s Situation, Background, Assessment and   Recommendations(SBAR).     Information from the following report(s) SBAR, ED Summary, Intake/Output and MAR was reviewed with the receiving nurse.    Lines:   Venous Access Device 05/18/15 Upper chest (subclavicular area, right (Active)        Opportunity for questions and clarification was provided.      Patient transported with:   [employer]

## 2015-05-18 NOTE — Progress Notes (Addendum)
Nursing speaks with primary MD Dr Clabe SealEkunsanmi  new orders received verbally for narcotic analgesics at this time, will inform pt at this time, states pain generalized 8/10 and nausea also with no emesis

## 2015-05-18 NOTE — ED Notes (Signed)
Pt does not want heparin if she can afford it. Wants to wait to talk to hospitalist. MD aware and is ok with that. Dr. Irving CopasFinn gave the OK to give vitamin K and hold off on the heparin

## 2015-05-19 ENCOUNTER — Inpatient Hospital Stay: Admit: 2015-05-19 | Payer: MEDICARE | Primary: Student in an Organized Health Care Education/Training Program

## 2015-05-19 ENCOUNTER — Inpatient Hospital Stay: Payer: MEDICARE | Primary: Student in an Organized Health Care Education/Training Program

## 2015-05-19 LAB — D-DIMER, QUANTITATIVE: D-Dimer, Quant: 0.49 ug/ml(FEU) (ref <0.55)

## 2015-05-19 LAB — D DIMER: D DIMER: 0.49 ug/ml(FEU) (ref <0.55)

## 2015-05-19 LAB — PTT
aPTT: >110 s — CR (ref 23.5–31.7)
aPTT: 49.5 s — ABNORMAL HIGH (ref 23.5–31.7)

## 2015-05-19 LAB — PROTHROMBIN TIME + INR
INR: 1.1 (ref 0.9–1.2)
Prothrombin time: 12.4 s — ABNORMAL HIGH (ref 9.6–12.0)

## 2015-05-19 MED ORDER — DIPHENHYDRAMINE 25 MG CAP
25 mg | Freq: Four times a day (QID) | ORAL | Status: DC | PRN
Start: 2015-05-19 — End: 2015-05-19

## 2015-05-19 MED ORDER — HEPARIN (PORCINE) 5,000 UNIT/ML IJ SOLN
5000 unit/mL | INTRAMUSCULAR | Status: AC
Start: 2015-05-19 — End: 2015-05-19
  Administered 2015-05-19: 06:00:00 via INTRAVENOUS

## 2015-05-19 MED ORDER — MORPHINE 2 MG/ML INJECTION
2 mg/mL | INTRAMUSCULAR | Status: DC | PRN
Start: 2015-05-19 — End: 2015-05-18

## 2015-05-19 MED ORDER — MORPHINE 2 MG/ML INJECTION
2 mg/mL | INTRAMUSCULAR | Status: DC | PRN
Start: 2015-05-19 — End: 2015-05-21
  Administered 2015-05-19 – 2015-05-21 (×19): via INTRAVENOUS

## 2015-05-19 MED ORDER — DIPHENHYDRAMINE 25 MG CAP
25 mg | Freq: Once | ORAL | Status: AC
Start: 2015-05-19 — End: 2015-05-20
  Administered 2015-05-20: 10:00:00 via ORAL

## 2015-05-19 MED ORDER — DIPHENHYDRAMINE 25 MG CAP
25 mg | Freq: Once | ORAL | Status: DC
Start: 2015-05-19 — End: 2015-05-19

## 2015-05-19 MED ORDER — PREDNISONE 20 MG TAB
20 mg | Freq: Once | ORAL | Status: AC
Start: 2015-05-19 — End: 2015-05-20
  Administered 2015-05-20: 10:00:00 via ORAL

## 2015-05-19 MED ORDER — PREDNISONE 10 MG TAB
10 mg | Freq: Once | ORAL | Status: AC
Start: 2015-05-19 — End: 2015-05-20
  Administered 2015-05-20: 04:00:00 via ORAL

## 2015-05-19 MED ORDER — PREDNISONE 20 MG TAB
20 mg | Freq: Every day | ORAL | Status: DC
Start: 2015-05-19 — End: 2015-05-19

## 2015-05-19 MED ORDER — PREDNISONE 10 MG TAB
10 mg | Freq: Once | ORAL | Status: AC
Start: 2015-05-19 — End: 2015-05-19
  Administered 2015-05-19: 21:00:00 via ORAL

## 2015-05-19 MED ORDER — HEPARIN (PORCINE) 5,000 UNIT/ML IJ SOLN
5000 unit/mL | Freq: Once | INTRAMUSCULAR | Status: AC
Start: 2015-05-19 — End: 2015-05-19
  Administered 2015-05-19: 21:00:00 via INTRAVENOUS

## 2015-05-19 MED ORDER — DIPHENHYDRAMINE 25 MG CAP
25 mg | Freq: Once | ORAL | Status: AC
Start: 2015-05-19 — End: 2015-05-18
  Administered 2015-05-19: 02:00:00 via ORAL

## 2015-05-19 MED ORDER — HEPARIN (PORCINE) 5,000 UNIT/ML IJ SOLN
5000 unit/mL | Freq: Once | INTRAMUSCULAR | Status: DC
Start: 2015-05-19 — End: 2015-05-19
  Administered 2015-05-19: 06:00:00 via INTRAVENOUS

## 2015-05-19 MED FILL — MORPHINE 2 MG/ML INJECTION: 2 mg/mL | INTRAMUSCULAR | Qty: 1

## 2015-05-19 MED FILL — DULOXETINE 30 MG CAP, DELAYED RELEASE: 30 mg | ORAL | Qty: 1

## 2015-05-19 MED FILL — CLINDAMYCIN 150 MG CAP: 150 mg | ORAL | Qty: 2

## 2015-05-19 MED FILL — VITAMIN D3 10 MCG (400 UNIT) TABLET: 10 mcg (400 unit) | ORAL | Qty: 2

## 2015-05-19 MED FILL — HEPARIN (PORCINE) IN D5W 25,000 UNIT/500 ML IV: 25000 unit/500 mL (50 unit/mL) | INTRAVENOUS | Qty: 500

## 2015-05-19 MED FILL — PROMETHAZINE 25 MG TAB: 25 mg | ORAL | Qty: 1

## 2015-05-19 MED FILL — ZOLPIDEM 5 MG TAB: 5 mg | ORAL | Qty: 1

## 2015-05-19 MED FILL — TYLENOL 325 MG TABLET: 325 mg | ORAL | Qty: 1

## 2015-05-19 MED FILL — DIPHENHYDRAMINE 25 MG CAP: 25 mg | ORAL | Qty: 1

## 2015-05-19 MED FILL — PREDNISONE 10 MG TAB: 10 mg | ORAL | Qty: 1

## 2015-05-19 MED FILL — HEPARIN (PORCINE) 5,000 UNIT/ML IJ SOLN: 5000 unit/mL | INTRAMUSCULAR | Qty: 1

## 2015-05-19 NOTE — Progress Notes (Signed)
Pt back to her room 825 from her procedure, pt alert and oriented, with no s/s of pain or distress.

## 2015-05-19 NOTE — Progress Notes (Signed)
Physical assessment completed, pt alert and oriented, resting in bed with no s/s of pain or distress.

## 2015-05-19 NOTE — Progress Notes (Signed)
Medicated with 2mg  Morphine IV for complaints of 8/10 left back pain.      IV Heparin restarted after one hour and rate changed to 12 units/kg/hr at 14.8 ml/hr, infusing through her right chest Life Port.  Will continue to monitor.

## 2015-05-19 NOTE — Progress Notes (Signed)
Medicated with 2mg  Morphine IV For complaints of 7/10 left back and chest pain.  Sitting up on side of bed with dinner tray.

## 2015-05-19 NOTE — Progress Notes (Signed)
Morphine given for back pain.

## 2015-05-19 NOTE — Progress Notes (Signed)
Heparin IV drip stopped for blood draw.

## 2015-05-19 NOTE — Progress Notes (Signed)
Ambien given per pt request.

## 2015-05-19 NOTE — Progress Notes (Signed)
Pain medication effective no further complaints.

## 2015-05-19 NOTE — Progress Notes (Signed)
Morphine 2 mg given slow IV push for pt. Complaints of left rib pain 9/10

## 2015-05-19 NOTE — Progress Notes (Signed)
Bedside rounding report given to Lynne LoganErnesto, [place].  Patient remains off floor.

## 2015-05-19 NOTE — Progress Notes (Signed)
Morphine 2mg  IV and phenergan 25 mg given PO for pt c/o nausea and right lung pain 7/10

## 2015-05-19 NOTE — Progress Notes (Signed)
Patient transported to Radiology department via stretcher for Doppler studies ordered, with Heparin Drip infusing.

## 2015-05-19 NOTE — Progress Notes (Signed)
IV contrast allergy premedication protocol faxed to floor & RN notified

## 2015-05-19 NOTE — Progress Notes (Signed)
Hospitalist Progress Note    05/19/2015  Admit Date: 05/18/2015  8:33 AM   NAME: Krista Lopez   DOB:  [birth year]   MRN:  [id number]   Attending: Suszanne ConnersBamidele A Glenford Garis, MD  PCP:  Rosezena SensorBarto P Kellett, MD    SUBJECTIVE:   Krista HerterShannon continues to have pleuritic chest pain but its tempered with narcotic analgesics  And tylenol.  INR is trending down at 4.7.  She is on Heparin  and we are expecting official consult from Hematologist today.  No fever no chills.      Review of Systems negative with exception of pertinent positives noted above  PHYSICAL EXAM     Visit Vitals   ??? BP 105/59 (BP 1 Location: Left arm, BP Patient Position: At rest)   ??? Pulse 78   ??? Temp 98.7 ??F (37.1 ??C)   ??? Resp 18   ??? Ht 5' 4.5" (1.638 m)   ??? Wt 61.7 kg (136 lb 1.6 oz)  Comment: stand up scales at bedside   ??? LMP 07/28/2004   ??? SpO2 95%   ??? BMI 23 kg/m2      Temp (24hrs), Avg:98.5 ??F (36.9 ??C), Min:97.8 ??F (36.6 ??C), Max:98.9 ??F (37.2 ??C)    Oxygen Therapy  O2 Sat (%): 95 % (05/19/15 0737)  Pulse via Oximetry: 91 beats per minute (05/18/15 1445)  O2 Device: Room air (05/18/15 1445)    Intake/Output Summary (Last 24 hours) at 05/19/15 0939  Last data filed at 05/19/15 0101   Gross per 24 hour   Intake              240 ml   Output                0 ml   Net              240 ml      General: Well developed well nourished Caucasian female in apparent discomfort with deep breathing.????  Lungs:  CTA Bilaterally.   Heart:  S1S2 Regular rate and rhythm,?? No murmur, rub, or gallop  Abdomen: Soft, Non distended, Non tender, Positive bowel sounds  Extremities: No cyanosis, clubbing or edema  Neurologic:?? No focal deficits    ASSESSMENT      Active Hospital Problems    Diagnosis Date Noted   ??? Bilateral pulmonary embolism (HCC) 05/18/2015     Priority: 1 - One   ??? Coagulopathy (HCC) 04/26/2013     Priority: 2 - Two   ??? Pleuritic chest pain 05/18/2015     Priority: 3 - Three   ??? Shortness of breath 05/18/2015     Priority: 4 - Four      Plan:CONTINUE WITH HEPARIN ANTICOAGULATION  -MONITOR INR AND PTT  -CONTINUE ANALGESICS  ??     DVT Prophylaxis: HEPARIN    Signed By: Suszanne ConnersBamidele A Ashlynn Gunnels, MD     May 19, 2015

## 2015-05-19 NOTE — Progress Notes (Signed)
Morning bedside rounding report received from Les PouKerri B., RN.  Dual sign off for Heparin drip completed.  IV Heparin infusing at 15 units/kg/hr at 18.5 ml/hr through [facility] in her right [facility].      Morning assessment completed.  Patient resting in bed with open eyes, television on.  Demonstrating no signs of dyspnea or distress on room air.  Verbalizes no concerns or complaints at this time.  Will continue to monitor.

## 2015-05-19 NOTE — Progress Notes (Signed)
Critical lab results received at 0152 for pt PTT of > 110. Pt. Heparin drip stopped temporarily per protocol.

## 2015-05-19 NOTE — Consults (Addendum)
Inpatient Hematology / Oncology Consult Note    Reason for Consult:  Bilateral pulmonary embolism (HCC)  Pleuritic chest pain  Shortness of breath  Coagulopathy [facility])  Referring Physician:  Dorothyann Peng, MD    History of Present Illness:  Krista Lopez is a a 50 yo admitted for bilateral pulmonary embolism. She presented to the ED with a 12 hour history of left pleuritic chest pain-left anterior and substernal described as knifelike. No cough. No fever. No sputum. Slight shortness of breath. 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 She had a dental procedure scheduled and was asked to stop her warfarin and has only stopped this for three days when current symptoms set in. Patient could not tolerate CT so VQ scan- High probability for PE. Her INR was 5.1 so vitamin K was given. Urine showed +4 bacteria. Clindamycin was started. Currently on heparin drip.       Review of Systems:  Constitutional Denies fever, chills, weight loss, appetite changes, fatigue, night sweats.   HEENT Denies trauma, blurry vision, hearing loss, ear pain, nosebleeds, sore throat, neck pain     Skin Denies lesions or rashes.   Lungs Denies dyspnea, cough, sputum production or hemoptysis.   Cardiovascular Denies chest pain, palpitations, or lower extremity edema.   Gastrointestinal Denies nausea, vomiting, changes in bowel habits, bloody or black stools, abdominal pain.   GU Denies dysuria, frequency or hesitancy of urination.   Neuro Denies headaches, visual changes or ataxia. Denies dizziness, tingling, tremors, sensory change, speech change, focal weakness or headaches.     Hematology Denies easy bruising or bleeding, denies gingival bleeding or epistaxis.   MSK Denies arthralgias, myalgias or frequent falls.      Psychiatric/Behavioral Depression. The patient is not nervous/anxious.         Allergies   Allergen Reactions   ??? Ativan [Lorazepam] Itching   ??? Codeine Rash   ??? Demerol [Meperidine] Itching     *     ??? Hydrocodone-Ibuprofen Unknown (comments)   ??? Iodinated Contrast Media - Oral And Iv Dye Rash     States also has swelling, and allergic to shellfish  Pt has had IV contrast several times after pre-medication   ??? Ketorolac Tromethamine Rash   ??? Pcn [Penicillins] Hives   ??? Pneumococcal Vaccine Swelling   ??? Pneumovax 23 [Pneumococcal 23-Val Ps Vaccine] Other (comments)     Local reaction   ??? Shellfish Containing Products Rash     Past Medical History:   Diagnosis Date   ??? Anxiety    ??? Bipolar disorder ([place])    ??? Calculus of kidney    ??? Depression    ??? 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 Hematuria, microscopic    ??? Infectious disease 02/2009     Hx MRSA / E.Coli bacteremia, MRSA wound infection 2/2 port   ??? Iron deficiency    ??? Migraines    ??? 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 Restless leg syndrome      Past Surgical History:   Procedure Laterality Date   ??? CYSTOSCOPY  ???    x 13   ??? HX APPENDECTOMY  ???   ??? HX CESAREAN SECTION     ???  HX ORTHOPAEDIC      left LE   ??? HX OTHER SURGICAL  02/13/2015    scar removal from B lung at [facility]   ??? HX OVARIAN CYST REMOVAL      on R   ??? HX TONSILLECTOMY     ??? HX TUBAL LIGATION     ??? HX VASCULAR ACCESS  July, 2010    Power Port placed in [place], [place] removed   ??? HX VASCULAR ACCESS Right 2013    pt has current rt subclavian port - placed at [facility]   ??? LITHOTRIPSY  2006     Family History   Problem Relation Age of Onset   ??? Bleeding Prob Father      Factor V   ??? Ovarian Cancer Mother 43     passed away due to ca   ??? Bipolar Disorder Mother    ??? Kidney Disease Brother      kidney stones   ??? Cancer Maternal Grandmother      metastatic breast CA.     Social History     Social History    ??? Marital status: DIVORCED     Spouse name: N/A   ??? Number of children: N/A   ??? Years of education: N/A     Occupational History   ??? Not on file.     Social History Main Topics   ??? Smoking status: Never Smoker   ??? Smokeless tobacco: Never Used   ??? Alcohol use Yes      Comment: rarely - only drinks 2 times per year   ??? Drug use: No      Comment: narcotic seeking per hospital hx.   ??? Sexual activity: Yes     Partners: Male     Birth control/ protection: Surgical     Other Topics Concern   ??? Not on file     Social History Narrative     Current Facility-Administered Medications   Medication Dose Route Frequency Provider Last Rate Last Dose   ??? heparin (porcine) injection 5,000 Units  5,000 Units IntraVENous NOW Dorothyann Peng, MD       ??? heparin 25,000 units in dextrose 500 mL infusion  18-36 Units/kg/hr IntraVENous TITRATE Dorothyann Peng, MD       ??? clindamycin (CLEOCIN) capsule 300 mg  300 mg Oral TID Harlene Ramus, MD       ??? promethazine (PHENERGAN) tablet 25 mg  25 mg Oral Q6H PRN Bamidele A Ekunsanmi, MD       ??? .PHARMACY TO SUBSTITUTE PER PROTOCOL    Per Protocol Bamidele A Ekunsanmi, MD       ??? .PHARMACY TO SUBSTITUTE PER PROTOCOL    Per Protocol Bamidele A Ekunsanmi, MD       ??? albuterol (PROVENTIL HFA, VENTOLIN HFA, PROAIR HFA) inhaler 1 Puff  1 Puff Inhalation Q4H PRN Bamidele A Ekunsanmi, MD       ??? .PHARMACY TO SUBSTITUTE PER PROTOCOL    Per Protocol Bamidele A Ekunsanmi, MD       ??? DULoxetine (CYMBALTA) capsule 30 mg  30 mg Oral BID Harlene Ramus, MD         Current Outpatient Prescriptions   Medication Sig Dispense Refill   ??? promethazine (PHENERGAN) 25 mg tablet Take 1 Tab by mouth every six (6) hours as needed for Nausea. 12 Tab 0   ??? butalbital-acetaminophen-caff (FIORICET) 50-300-40 mg per capsule Take 1 Cap by mouth every four (  4) hours as needed for Pain. Max Daily Amount: 6 Caps. 20 Cap 0   ??? zolpidem CR (AMBIEN CR) 12.5 mg tablet Take 12.5 mg by mouth nightly as needed for Sleep.      ??? albuterol (PROVENTIL HFA, VENTOLIN HFA, PROAIR HFA) 90 mcg/actuation inhaler Take 1 Puff by inhalation every four (4) hours as needed for Wheezing. 1 Inhaler 0   ??? cholecalciferol (VITAMIN D3) 1,000 unit cap Take  by mouth daily.     ??? DULoxetine (CYMBALTA) 60 mg capsule Take 3 caps every day.  Indications: ANXIETY WITH DEPRESSION (Patient taking differently: 30 mg two (2) times a day. Pt states she take 7m BID  Indications: ANXIETY WITH DEPRESSION) 90 Cap 0       OBJECTIVE:  Patient Vitals for the past 8 hrs:   BP Pulse Resp SpO2 Height Weight   05/18/15 1445 115/55 91 20 96 % - -   05/18/15 1430 134/58 90 - 100 % - -   05/18/15 1415 133/57 93 - 99 % - -   05/18/15 1400 131/68 83 14 98 % - -   05/18/15 1345 133/59 80 14 97 % 5' 4.5" (1.638 m) 142 lb (64.4 kg)   05/18/15 1330 118/50 73 11 94 % - -   05/18/15 1316 - 69 17 96 % - -   05/18/15 1315 128/61 - - - - -   05/18/15 1300 132/58 89 22 97 % - -   05/18/15 1245 147/63 92 - 100 % - -   05/18/15 1236 - 72 17 98 % - 142 lb (64.4 kg)   05/18/15 1100 122/58 72 17 94 % - -   05/18/15 1000 130/75 74 - - - -   05/18/15 0909 - - - 99 % - -     No data recorded.         Physical Exam:  Constitutional: Well developed, well nourished female in no acute distress, sitting comfortably in the hospital bed.    HEENT: Normocephalic and atraumatic. Oropharynx is clear, mucous membranes are moist.  Extraocular muscles are intact.  Sclerae anicteric   Lymph node   No palpable submandibular, cervical, supraclavicular, axillary or inguinal lymph nodes.   Skin Warm and dry.  No bruising and no rash noted.  No erythema.  No pallor.    Respiratory Lungs are clear to auscultation bilaterally without wheezes, rales or rhonchi, normal air exchange without accessory muscle use.    CVS Normal rate, regular rhythm and normal S1 and S2.  No murmurs, gallops, or rubs.   Abdomen Soft, nontender and nondistended, normoactive bowel sounds.  No palpable mass.  No hepatosplenomegaly.    Neuro Grossly nonfocal with no obvious sensory or motor deficits.   MSK Normal range of motion in general.  No edema and no tenderness.   Psych Appropriate mood and affect.        Labs:    Recent Results (from the past 24 hour(s))   EKG, 12 LEAD, INITIAL    Collection Time: 05/18/15  8:39 AM   Result Value Ref Range    Ventricular Rate 89 BPM    Atrial Rate 89 BPM    P-R Interval 152 ms    QRS Duration 90 ms    Q-T Interval 344 ms    QTC Calculation (Bezet) 418 ms    Calculated P Axis 82 degrees    Calculated R Axis 98 degrees    Calculated T Axis 84 degrees  Diagnosis       !! AGE AND GENDER SPECIFIC ECG ANALYSIS !!  Normal sinus rhythm  Right atrial enlargement  Rightward axis  Pulmonary disease pattern  Nonspecific ST abnormality  Abnormal ECG  When compared with ECG of 15-Apr-2015 09:26,  No significant change was found  Confirmed by [facility]  MD (UC), MATTHEW G ([id number]) on 05/18/2015 9:19:38 AM     CBC WITH AUTOMATED DIFF    Collection Time: 05/18/15  8:52 AM   Result Value Ref Range    WBC 5.2 4.3 - 11.1 K/uL    RBC 3.94 (L) 4.05 - 5.25 M/uL    HGB 10.4 (L) 11.7 - 15.4 g/dL    HCT 31.5 (L) 35.8 - 46.3 %    MCV 79.9 79.6 - 97.8 FL    MCH 26.4 26.1 - 32.9 PG    MCHC 33.0 31.4 - 35.0 g/dL    RDW 14.6 11.9 - 14.6 %    PLATELET 333 150 - 450 K/uL    MPV 10.8 10.8 - 14.1 FL    DF AUTOMATED      NEUTROPHILS 48 43 - 78 %    LYMPHOCYTES 31 13 - 44 %    MONOCYTES 9 4.0 - 12.0 %    EOSINOPHILS 11 (H) 0.5 - 7.8 %    BASOPHILS 1 0.0 - 2.0 %    IMMATURE GRANULOCYTES 0.2 0.0 - 5.0 %    ABS. NEUTROPHILS 2.5 1.7 - 8.2 K/UL    ABS. LYMPHOCYTES 1.6 0.5 - 4.6 K/UL    ABS. MONOCYTES 0.5 0.1 - 1.3 K/UL    ABS. EOSINOPHILS 0.6 0.0 - 0.8 K/UL    ABS. BASOPHILS 0.0 0.0 - 0.2 K/UL    ABS. IMM. GRANS. 0.0 0.0 - 0.5 K/UL   METABOLIC PANEL, COMPREHENSIVE    Collection Time: 05/18/15  8:52 AM   Result Value Ref Range    Sodium 143 136 - 145 mmol/L    Potassium 3.5 3.5 - 5.1 mmol/L    Chloride 110 (H) 98 - 107 mmol/L     CO2 23 21 - 32 mmol/L    Anion gap 10 7 - 16 mmol/L    Glucose 94 65 - 100 mg/dL    BUN 14 6 - 23 MG/DL    Creatinine 0.87 0.6 - 1.0 MG/DL    GFR est AA >60 >60 ml/min/1.63m    GFR est non-AA >60 >60 ml/min/1.732m   Calcium 8.5 8.3 - 10.4 MG/DL    Bilirubin, total 0.2 0.2 - 1.1 MG/DL    ALT (SGPT) 20 12 - 65 U/L    AST (SGOT) 10 (L) 15 - 37 U/L    Alk. phosphatase 132 50 - 136 U/L    Protein, total 7.8 6.3 - 8.2 g/dL    Albumin 3.7 3.5 - 5.0 g/dL    Globulin 4.1 (H) 2.3 - 3.5 g/dL    A-G Ratio 0.9 (L) 1.2 - 3.5     TROPONIN I    Collection Time: 05/18/15  8:52 AM   Result Value Ref Range    Troponin-I, Qt. <0.02 (L) 0.02 - 0.05 NG/ML   PROTHROMBIN TIME + INR    Collection Time: 05/18/15  8:52 AM   Result Value Ref Range    Prothrombin time 55.8 (H) 9.6 - 12.0 sec    INR 5.1 (HH) 0.9 - 1.2     POC TROPONIN-I    Collection Time: 05/18/15  8:55 AM   Result Value Ref  Range    Troponin-I (POC) 0 0.0 - 0.08 ng/ml   POC PT/INR    Collection Time: 05/18/15 12:35 PM   Result Value Ref Range    Prothrombin time (POC) 52.2 (H) 9.6 - 11.6 SECS    INR (POC) 4.7 (HH) 0.9 - 1.2         Imaging:  _0 @    ASSESSMENT:  Problem List  Date Reviewed: Jun 25, 2014          Codes Class Noted    Shortness of breath ICD-10-CM: R06.02  ICD-9-CM: 786.05  05/18/2015        Pleuritic chest pain ICD-10-CM: R07.81  ICD-9-CM: 786.52  05/18/2015        Bilateral pulmonary embolism ([place]) ICD-10-CM: I26.99  ICD-9-CM: 415.19  05/18/2015        Calculus of kidney ICD-10-CM: N20.0  ICD-9-CM: 592.0  08/05/2013        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[place]) ICD-10-CM: T45.511A, D68.9  ICD-9-CM: 286.9, E934.2  05/19/2013        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 Testing at [facility] and also here in [facility] in 2010 are negative for Factor V leiden mutation             Coagulopathy ([place]) ICD-10-CM: D68.9  ICD-9-CM: 286.9  04/26/2013        Epistaxis ICD-10-CM: R04.0  ICD-9-CM: 784.7  04/26/2013        [place] ICD-10-CM: K92.1  ICD-9-CM: 578.1  04/26/2013        Acute blood loss anemia ICD-10-CM: D62  ICD-9-CM: 285.1  04/26/2013        Supratherapeutic INR ICD-10-CM: R79.1  ICD-9-CM: 790.92  01/19/2013    Overview Signed 01/19/2013  9:44 PM by Milagros Reap     INR>6.8. Apparently unable to quantify per ER. Specimen had to be sent to [place] history of PE (pulmonary embolism), with Factor V mutation ICD-10-CM: U93.235  ICD-9-CM: V12.55  09/22/2009        Restless leg syndrome (Chronic) ICD-10-CM: G25.81  ICD-9-CM: 333.94  09/21/2009        Patient noncompliance (Chronic) ICD-10-CM: Z91.19  ICD-9-CM: V15.81  10/04/2008                RECOMMENDATIONS:  Clinically suspicious for DVT however D Dimer is normal. Elevated INR has reversed. According to patient, she has been on Xarelto in the past which  caused hematuria. She has been on coumadin and lovenox in the past also. We would like for her to have CT Chest with pre-medication and she has agreed. Also we will doppler her lower extremities. We suggest bridging her to Lovenox 1.5 mg/kg as outpatient which she agrees to use. She will need follow up with Dr. Verl Blalock in one week after discharge.     Lab studies and imaging studies were personally reviewed.  Pertinent old records were reviewed.    Thank you for allowing [place] to participate in the care of Krista Lopez.              [facility]  [address] [place] [address]  Office : [phone]  Fax : [phone]       Attending Addendum:  I personally evaluated the patient with Krista Lopez N.P.,  and agree with the assessment, findings and plan as documented.  Appears stable, heart regular without murmur, lungs clear, abdomen benign.  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 She had apparently stopped for coumadin for a few days for a dental procedure but INR was still elevated at time of ER visit at over 5. PT & PTT corrected on mixing studies, and INR has reversed w Vit K. Given D dimer is normal, would clarify clot status w contrasted CT chest (contrast allergy, will need prep), as well as LE dopplers. If clinically we are to take this as new PE's w/ INR's over 5 then this would be considered coumadin failure, and as such would recommend discharging her on enoxaparin 1.5 mg/kdg daily. She should be seen by her primary hematologist Dr Yolanda Bonine within a week of discharge at which time it can be decided if she can be switched over to the newer anti-Xa agents (apparently she had some hematuria w rivaroxaban in past).      Thank you for allowing [place] to participate in care of this pleasant patient. Please call w any questions.                Letha Cape, MD  [facility]  [facility]  [address] [place] [address]  Office : [phone]  Fax : [phone]

## 2015-05-19 NOTE — Progress Notes (Signed)
Medicated with 2mg  Morphine IV for complaints of 8/10 right back pain.  Resting in bed with television on.  Heparin drip stopped for blood draw from [facility].

## 2015-05-20 ENCOUNTER — Inpatient Hospital Stay: Admit: 2015-05-20 | Payer: MEDICARE | Primary: Student in an Organized Health Care Education/Training Program

## 2015-05-20 LAB — PTT
aPTT: 93 s — CR (ref 23.5–31.7)
aPTT: 30.6 s (ref 23.5–31.7)

## 2015-05-20 LAB — CREATININE: Creatinine: 0.85 MG/DL (ref 0.6–1.0)

## 2015-05-20 MED ORDER — ENOXAPARIN 100 MG/ML SUB-Q SYRINGE
100 mg/mL | SUBCUTANEOUS | Status: DC
Start: 2015-05-20 — End: 2015-05-21
  Administered 2015-05-20 – 2015-05-21 (×2): via SUBCUTANEOUS

## 2015-05-20 MED ORDER — IOPAMIDOL 76 % IV SOLN
370 mg iodine /mL (76 %) | Freq: Once | INTRAVENOUS | Status: AC
Start: 2015-05-20 — End: 2015-05-20
  Administered 2015-05-20: 19:00:00 via INTRAVENOUS

## 2015-05-20 MED ORDER — DIPHENHYDRAMINE HCL 50 MG/ML IJ SOLN
50 mg/mL | Freq: Once | INTRAMUSCULAR | Status: AC
Start: 2015-05-20 — End: 2015-05-20
  Administered 2015-05-20: 19:00:00 via INTRAVENOUS

## 2015-05-20 MED ORDER — HEPARIN (PORCINE) 10,000 UNIT/ML IJ SOLN
10000 unit/mL | Freq: Once | INTRAMUSCULAR | Status: DC
Start: 2015-05-20 — End: 2015-05-20

## 2015-05-20 MED ORDER — DIPHENHYDRAMINE HCL 50 MG/ML IJ SOLN
50 mg/mL | Freq: Once | INTRAMUSCULAR | Status: AC
Start: 2015-05-20 — End: 2015-05-20
  Administered 2015-05-20: 14:00:00 via INTRAVENOUS

## 2015-05-20 MED ORDER — ALTEPLASE 2 MG SOLUTION FOR INJECTION
2 mg | Freq: Once | Status: AC
Start: 2015-05-20 — End: 2015-05-20
  Administered 2015-05-20: 22:00:00

## 2015-05-20 MED ORDER — SODIUM CHLORIDE 0.9% BOLUS IV
0.9 % | Freq: Once | INTRAVENOUS | Status: AC
Start: 2015-05-20 — End: 2015-05-20
  Administered 2015-05-20: 19:00:00 via INTRAVENOUS

## 2015-05-20 MED ORDER — SALINE PERIPHERAL FLUSH PRN
Freq: Once | INTRAMUSCULAR | Status: AC
Start: 2015-05-20 — End: 2015-05-20
  Administered 2015-05-20: 19:00:00

## 2015-05-20 MED FILL — PROMETHAZINE 25 MG TAB: 25 mg | ORAL | Qty: 1

## 2015-05-20 MED FILL — MORPHINE 2 MG/ML INJECTION: 2 mg/mL | INTRAMUSCULAR | Qty: 1

## 2015-05-20 MED FILL — DIPHENHYDRAMINE HCL 50 MG/ML IJ SOLN: 50 mg/mL | INTRAMUSCULAR | Qty: 1

## 2015-05-20 MED FILL — ALTEPLASE 2 MG SOLUTION FOR INJECTION: 2 mg | Qty: 2

## 2015-05-20 MED FILL — PREDNISONE 10 MG TAB: 10 mg | ORAL | Qty: 1

## 2015-05-20 MED FILL — CLINDAMYCIN 150 MG CAP: 150 mg | ORAL | Qty: 2

## 2015-05-20 MED FILL — DULOXETINE 30 MG CAP, DELAYED RELEASE: 30 mg | ORAL | Qty: 1

## 2015-05-20 MED FILL — ZOLPIDEM 5 MG TAB: 5 mg | ORAL | Qty: 1

## 2015-05-20 MED FILL — LOVENOX 100 MG/ML SUBCUTANEOUS SYRINGE: 100 mg/mL | SUBCUTANEOUS | Qty: 1

## 2015-05-20 MED FILL — VITAMIN D3 10 MCG (400 UNIT) TABLET: 10 mcg (400 unit) | ORAL | Qty: 2

## 2015-05-20 MED FILL — CATHFLO ACTIVASE 2 MG INTRA-CATHETER SOLUTION: 2 mg | Qty: 2

## 2015-05-20 MED FILL — DIPHENHYDRAMINE 25 MG CAP: 25 mg | ORAL | Qty: 2

## 2015-05-20 NOTE — Progress Notes (Signed)
No further complains of nausea.

## 2015-05-20 NOTE — Progress Notes (Signed)
Spoke with Armando GangMichelle Nicks, NP concerning CT testing, patient refusal, and benadryl. New orders received

## 2015-05-20 NOTE — Progress Notes (Signed)
Benadryl 50 mg slow IV given for CT scan.

## 2015-05-20 NOTE — Progress Notes (Signed)
Morphine given for back pain.

## 2015-05-20 NOTE — Progress Notes (Signed)
Pain medication effective no further complaints.

## 2015-05-20 NOTE — Progress Notes (Signed)
No further c/o nausea.

## 2015-05-20 NOTE — Progress Notes (Signed)
Morphine 2 mg slow IV given for pain. Phenergan 25 mg po given for c/o nausea. No emesis noted.

## 2015-05-20 NOTE — Progress Notes (Signed)
Morphine 2 mg slow IV given for pain.

## 2015-05-20 NOTE — Progress Notes (Signed)
Respirations even and unlabored. No s/sx distress. No needs at present.

## 2015-05-20 NOTE — Progress Notes (Signed)
Unable to perform CT until have recent creatinine results within 24 hrs. Unable to get blood from port or venipuncture in r hand. Notified Blake DivineShauna, RN on floor

## 2015-05-20 NOTE — Progress Notes (Signed)
Inpatient Hematology / Oncology Progress Note      Admission Date: 05/18/2015  8:33 AM  Reason for Admission/Hospital Course: Bilateral pulmonary embolism (HCC);Pleuritic chest pain;Sho*      24 Hour Events:  Tolerating heparin drip. Continues to note some chest discomfort.   Dopplers b/l LE -ve  Contrasted CT chest pending from today.        ROS:  As mentioned above. All other systems reviewed in full and are unremarkable.     10 point review of systems is otherwise negative with the exception of the elements mentioned above in the HPI.     Allergies   Allergen Reactions   ??? Ativan [Lorazepam] Itching   ??? Codeine Rash   ??? Demerol [Meperidine] Itching     *     ??? Hydrocodone-Ibuprofen Unknown (comments)   ??? Iodinated Contrast Media - Oral And Iv Dye Rash     States also has swelling, and allergic to shellfish  Pt has had IV contrast several times after pre-medication   ??? Ketorolac Tromethamine Rash   ??? Pcn [Penicillins] Hives   ??? Pneumococcal Vaccine Swelling   ??? Pneumovax 23 [Pneumococcal 23-Val Ps Vaccine] Other (comments)     Local reaction   ??? Shellfish Containing Products Rash       OBJECTIVE:  Patient Vitals for the past 8 hrs:   BP Temp Pulse Resp SpO2   05/20/15 1527 115/70 98.9 ??F (37.2 ??C) 83 18 98 %   05/20/15 1122 103/61 99.6 ??F (37.6 ??C) 79 16 97 %   05/20/15 0744 106/60 99.1 ??F (37.3 ??C) 87 17 98 %     Temp (24hrs), Avg:98.8 ??F (37.1 ??C), Min:98 ??F (36.7 ??C), Max:99.6 ??F (37.6 ??C)    04/09 0701 - 04/09 1900  In: 773 [P.O.:100; I.V.:673]  Out: -     Physical Exam:  Constitutional: Well developed, well nourished female in no acute distress, sitting comfortably in the hospital bed.    HEENT: Normocephalic and atraumatic. Oropharynx is clear, mucous membranes are moist.  Pupils are equal, round, and reactive to light. Extraocular muscles are intact.  Sclerae anicteric. Neck supple without JVD. No thyromegaly present.    Lymph node   No palpable submandibular, cervical, supraclavicular, axillary or  inguinal lymph nodes.   Skin Warm and dry.  No bruising and no rash noted.  No erythema.  No pallor.    Respiratory Lungs are clear to auscultation bilaterally without wheezes, rales or rhonchi, normal air exchange without accessory muscle use.    CVS Normal rate, regular rhythm and normal S1 and S2.  No murmurs, gallops, or rubs.   Abdomen Soft, nontender and nondistended, normoactive bowel sounds.  No palpable mass.  No hepatosplenomegaly.   Neuro Grossly nonfocal with no obvious sensory or motor deficits.   MSK Normal range of motion in general.  No edema and no tenderness.   Psych Appropriate mood and affect.        Labs:    Recent Labs      05/18/15   0852   WBC  5.2   RBC  3.94*   HGB  10.4*   HCT  31.5*   MCV  79.9   MCH  26.4   MCHC  33.0   RDW  14.6   PLT  333   GRANS  48   LYMPH  31   MONOS  9   EOS  11*   BASOS  1   IG  0.2  DF  AUTOMATED   ANEU  2.5   ABL  1.6   ABM  0.5   ABE  0.6   ABB  0.0   AIG  0.0      Recent Labs      05/20/15   1335  05/18/15   0852   NA   --   143   K   --   3.5   CL   --   110*   CO2   --   23   AGAP   --   10   GLU   --   94   BUN   --   14   CREA  0.85  0.87   GFRAA   --   >60   GFRNA   --   >60   CA   --   8.5   SGOT   --   10*   AP   --   132   TP   --   7.8   ALB   --   3.7   GLOB   --   4.1*   AGRAT   --   0.9*       Recent Results (from the past 24 hour(s))   PTT    Collection Time: 05/19/15  9:00 PM   Result Value Ref Range    aPTT 93.0 (HH) 23.5 - 31.7 SEC   PTT    Collection Time: 05/20/15  3:58 AM   Result Value Ref Range    aPTT 30.6 23.5 - 31.7 SEC   CREATININE    Collection Time: 05/20/15  1:35 PM   Result Value Ref Range    Creatinine 0.85 0.6 - 1.0 MG/DL         Imaging:          ASSESSMENT & PLAN:  Recurrent thrombophilia    - On heparin drip. Can be switched over to enoxaparin 1.5 mg/kg daily on discharge. F/u w Dr Anola GurneyWalls (hematology) within a week of discharge. F/u CT chest results from today.       Thank you for allowing us to participate in care of this pleasant patient. Please call w any questions.         Learta CoddingFahd Ava Deguire, MD  [facility]  [address], [address]  [place], [place] [id number]  Office : [phone]  Fax : [phone]

## 2015-05-20 NOTE — Progress Notes (Signed)
Report given to oncoming nurse.

## 2015-05-20 NOTE — Progress Notes (Signed)
Morphine 2 mg slow IV given for pain. Phenergan 25 mg po given for c/o nausea. No emesis noted

## 2015-05-20 NOTE — Progress Notes (Signed)
CT department request creatinine level prior to testing per their protocol

## 2015-05-20 NOTE — Progress Notes (Signed)
[facility] still unable to collect blood samples from line. Cathflo ordered per hospital policy and protocol.

## 2015-05-20 NOTE — Progress Notes (Signed)
Benadryl 50 mg slow IV given for CT scan.

## 2015-05-20 NOTE — Progress Notes (Signed)
Patient continues to refuse to go to CT department unless she received benadryl close to time of testing. Call placed to oncology and hospitalist for patient needs.

## 2015-05-20 NOTE — Progress Notes (Signed)
Physical assessment completed, pt alert and oriented, with no s/s of pain or distress  Call light within reach.

## 2015-05-20 NOTE — Progress Notes (Signed)
Respirations even and unlabored. No s/sx distress. No needs at present. No pain or SOB. Assessment completed.

## 2015-05-20 NOTE — Progress Notes (Signed)
Pain medication effective, no further complaints.

## 2015-05-20 NOTE — Progress Notes (Signed)
Hospitalist Progress Note    05/20/2015  Admit Date: 05/18/2015  8:33 AM   NAME: Krista Lopez   DOB:  Apr 02, 1965   MRN:  [id number]   Attending: Harlene Ramus, MD  PCP:  Trixie Dredge, MD    SUBJECTIVE:   Patient met in her room looking better but says she is still in a lot of pleuritic chest pain.  Consult from Heme-Onc noted and appreciated.Doppler studies in the legs are negative.  CT chest is pending.INR is back to normal-reversed with vitamin k. Lovenox recommendation noted.  Starting today.        Review of Systems negative with exception of pertinent positives noted above  PHYSICAL EXAM     Visit Vitals   ??? BP 103/61   ??? Pulse 79   ??? Temp 99.6 ??F (37.6 ??C)   ??? Resp 16   ??? Ht 5' 4.5" (1.638 m)   ??? Wt 61.7 kg (136 lb 1.6 oz)  Comment: stand up scales at bedside   ??? LMP 07/28/2004   ??? SpO2 97%   ??? BMI 23 kg/m2      Temp (24hrs), Avg:98.7 ??F (37.1 ??C), Min:98 ??F (36.7 ??C), Max:99.6 ??F (37.6 ??C)    Oxygen Therapy  O2 Sat (%): 97 % (05/20/15 1122)  Pulse via Oximetry: 91 beats per minute (05/18/15 1445)  O2 Device: Room air (05/20/15 0710)    Intake/Output Summary (Last 24 hours) at 05/20/15 1130  Last data filed at 05/20/15 0900   Gross per 24 hour   Intake             1613 ml   Output                0 ml   Net             1613 ml      General: No acute distress????  Lungs:  CTA Bilaterally.   Heart:  Regular rate and rhythm,?? No murmur, rub, or gallop  Abdomen: Soft, Non distended, Non tender, Positive bowel sounds  Extremities: No cyanosis, clubbing or edema  Neurologic:?? No focal deficits    ASSESSMENT      Active Hospital Problems    Diagnosis Date Noted   ??? Bilateral pulmonary embolism ([place]) 05/18/2015     Priority: 1 - One   ??? Coagulopathy ([place]) 04/26/2013     Priority: 2 - Two   ??? Pleuritic chest pain 05/18/2015     Priority: 3 - Three   ??? Shortness of breath 05/18/2015     Priority: 4 - Four     Plan:  -Change heparin to lovenox  -Continue analgesics.  -Possible discharge tomorrow        DVT Prophylaxis: heparin /lovenox    Signed By: Harlene Ramus, MD     May 20, 2015

## 2015-05-20 NOTE — Progress Notes (Signed)
Morphine effective no further complaints.

## 2015-05-20 NOTE — Progress Notes (Addendum)
Spoke with Armando GangMichelle Nicks, NP concerning CT testing, patient refusal, and benadryl. New orders received.

## 2015-05-21 ENCOUNTER — Encounter: Attending: Women's Health | Primary: Student in an Organized Health Care Education/Training Program

## 2015-05-21 MED ORDER — ENOXAPARIN 100 MG/ML SUB-Q SYRINGE
100 mg/mL | INJECTION | SUBCUTANEOUS | 0 refills | Status: DC
Start: 2015-05-21 — End: 2015-06-03

## 2015-05-21 MED ORDER — HYDROCODONE-ACETAMINOPHEN 5 MG-325 MG TAB
5-325 mg | Freq: Four times a day (QID) | ORAL | Status: DC | PRN
Start: 2015-05-21 — End: 2015-05-21

## 2015-05-21 MED ORDER — HYDROCODONE-ACETAMINOPHEN 5 MG-325 MG TAB
5-325 mg | ORAL_TABLET | Freq: Four times a day (QID) | ORAL | 0 refills | Status: DC | PRN
Start: 2015-05-21 — End: 2015-07-25

## 2015-05-21 MED FILL — MORPHINE 2 MG/ML INJECTION: 2 mg/mL | INTRAMUSCULAR | Qty: 1

## 2015-05-21 MED FILL — ZOLPIDEM 5 MG TAB: 5 mg | ORAL | Qty: 1

## 2015-05-21 MED FILL — PROMETHAZINE 25 MG TAB: 25 mg | ORAL | Qty: 1

## 2015-05-21 MED FILL — CLINDAMYCIN 150 MG CAP: 150 mg | ORAL | Qty: 2

## 2015-05-21 MED FILL — LOVENOX 100 MG/ML SUBCUTANEOUS SYRINGE: 100 mg/mL | SUBCUTANEOUS | Qty: 1

## 2015-05-21 MED FILL — VITAMIN D3 10 MCG (400 UNIT) TABLET: 10 mcg (400 unit) | ORAL | Qty: 2

## 2015-05-21 MED FILL — DULOXETINE 30 MG CAP, DELAYED RELEASE: 30 mg | ORAL | Qty: 1

## 2015-05-21 NOTE — Progress Notes (Signed)
Report given to oncoming nurse.

## 2015-05-21 NOTE — Discharge Summary (Signed)
Hospitalist Discharge Summary     Patient ID:  Krista Lopez  [id number]  49 y.o.  Sep 10, 1965  Admit date: 05/18/2015  8:33 AM  Discharge date and time: 05/21/2015  Attending: Suszanne Conners, MD  PCP:  Rosezena Sensor, MD  Treatment Team: Attending Provider: Suszanne Conners, MD; Primary Nurse: Julianne Handler, RN    Principal Diagnosis Bilateral pulmonary embolism [facility])   Principal Problem:    Bilateral pulmonary embolism (HCC) (05/18/2015)    Active Problems:    Coagulopathy (HCC) (04/26/2013)      Shortness of breath (05/18/2015)      Pleuritic chest pain (05/18/2015)             Hospital Course:  Please refer to the admission H&P for details of presentation. 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 S/p pulm artery embolectomy and endarterectomy 3 mos ago. Off coumadin for a scheduled dental procedure and developed pleuritic chest pain. ED workup showing VQ scan high prob.  CT PE protocol was done following day (has IV contrast allergy). Admitted and started on hep gtt, seen by Oncology with recs for Lovenox sq. Will need to continue outpatient follow-up with heme/onc.     Significant Diagnostic Studies:   Final result (Exam End: 05/20/2015 ??3:07 PM) Open    ??   Study Result    ?? Chest CT  ??  INDICATION: Chest pain, history of clotting disorder and prior PE  ??  Multiple axial images were obtained through the chest during intravenous  infusion of of Isovue 370. Coronal reformats were also evaluated.   Radiation dose reduction techniques were used for this study: All CT scans  performed at this facility use one or all of the following: Automated exposure  control, adjustment of the mA and/or kVp according to patient's size, iterative  reconstruction. Compared with 07/15/2014.  ??  FINDINGS: Soft tissue density in the left hilum likely represents a lymph node  rather than thrombus. Measures 19 mm in diameter. There is attenuation of   several of the lower lobe pulmonary arteries evident bilaterally, most prominent  in the posterior right lung base. This likely represents chronic change  associated with old embolus. No definite acute filling defect is seen in the  pulmonary vessels. There is normal enhancement of the aorta. The patient is  post CABG.  ??  There is mild chronic change in both lung bases. There are no masses or  infiltrates. There are no effusions. There is no significant mediastinal or  axillary adenopathy. There are no bony lesions. Limited imaging of the upper  abdomen is also unremarkable.  ??  IMPRESSION  IMPRESSION: Chronic changes in the lung bases suggesting old pulmonary embolus.  No definite acute pulmonary embolus.  ??     Final result (Exam End: 05/18/2015 11:54 AM) Open    ??   Study Result    ?? Nuclear Medicine Lung Ventilation/Perfusion Scan  ??  Clinical Indication: Acute severe pleuritic chest pain with history of pulmonary  embolism and reported CT contrast allergy, factor V blood disorder, previous  cardiac surgery  ??  Comparison: 11/09/2014 VQ scan, also chest radiograph today  ??  Radiopharmaceutical: 37.7 mCi Tc 99 M DTPA inhaled, followed by 6.5 mCi Tc99-m  MAA IV.  ??  Technique: Initial breath, equilibrium, and washout ventilation is done with  inhaled Tc 99 M DTPA followed by high count planar  perfusion imaging with Tc-60m  MAA IV.   ??  Findings: There are multiple bilateral moderate peripheral wedge-shaped filling  defects (at least 3 on each side). These have increased since prominence since  the prior exam and are suspicious for acute to subacute pulmonary embolism in  this setting.  ??  Images were reviewed in consultation with colleagues, who agreed.  ??  ??  IMPRESSION  Impression: High probability for PE.     LE duplex negative    Labs: Results:       Chemistry Recent Labs      05/20/15   1335   CREA  0.85      CBC w/Diff No results for input(s): WBC, RBC, HGB, HCT, PLT, GRANS, LYMPH,  EOS, HGBEXT, HCTEXT, PLTEXT in the last 72 hours.   Cardiac Enzymes No results for input(s): CPK, CKND1, MYO in the last 72 hours.    No lab exists for component: CKRMB, TROIP   Coagulation Recent Labs      05/20/15   0358  05/19/15   2100  05/19/15   1115   PTP   --    --   12.4*   INR   --    --   1.1   APTT  30.6  93.0*  49.5*       Lipid Panel No results found for: CHOL, CHOLPOCT, CHOLX, CHLST, CHOLV, [id number], HDL, LDL, NLDLCT, DLDL, LDLC, DLDLP, [id number], VLDLC, VLDL, TGL, TGLX, TRIGL, [id number], TRIGP, TGLPOCT, [id number], CHHD, CHHDX   BNP No results for input(s): BNPP in the last 72 hours.   Liver Enzymes No results for input(s): TP, ALB, TBIL, AP, SGOT, GPT in the last 72 hours.    No lab exists for component: DBIL   Thyroid Studies Lab Results   Component Value Date/Time    TSH 1.338 09/21/2009 04:15 PM            Discharge Exam:  Visit Vitals   ??? BP 99/67   ??? Pulse 71   ??? Temp 99 ??F (37.2 ??C)   ??? Resp 16   ??? Ht 5' 4.5" (1.638 m)   ??? Wt 61.7 kg (136 lb 1.6 oz)  Comment: stand up scales at bedside   ??? LMP 07/28/2004   ??? SpO2 100%   ??? BMI 23 kg/m2     General appearance: alert, cooperative, no distress, appears stated age  Lungs: clear to auscultation bilaterally  Heart: regular rate and rhythm, S1, S2 normal, no murmur, click, rub or gallop  Abdomen: soft, non-tender. Bowel sounds normal. No masses,  no organomegaly  Extremities: no cyanosis or edema  Neurologic: Grossly normal    Disposition: home  Discharge Condition: stable  Patient Instructions:   Current Discharge Medication List      START taking these medications    Details   enoxaparin (LOVENOX) 100 mg/mL 90 mg by SubCUTAneous route every twenty-four (24) hours. Indications: Acute Pulmonary Thromboembolism, PULMONARY THROMBOEMBOLISM PREVENTION, Bilateral PE  Qty: 10 Syringe, Refills: 0      HYDROcodone-acetaminophen (NORCO) 5-325 mg per tablet Take 1 Tab by mouth every six (6) hours as needed. Max Daily Amount: 4 Tabs.  Qty: 20 Tab, Refills: 0          CONTINUE these medications which have NOT CHANGED    Details   DULoxetine (CYMBALTA) 60 mg capsule Take 60 mg by mouth daily.      clonazePAM (KLONOPIN) 1 mg tablet Take  by mouth three (3) times daily  as needed.      zolpidem (AMBIEN) 10 mg tablet Take  by mouth nightly as needed for Sleep.      pantoprazole (PROTONIX) 40 mg tablet Take 40 mg by mouth two (2) times a day.      multivitamin (ONE A DAY) tablet Take 1 Tab by mouth daily.      albuterol (PROVENTIL HFA, VENTOLIN HFA, PROAIR HFA) 90 mcg/actuation inhaler Take 1 Puff by inhalation every four (4) hours as needed for Wheezing.  Qty: 1 Inhaler, Refills: 0         STOP taking these medications       warfarin (COUMADIN) 3 mg tablet Comments:   Reason for Stopping:         warfarin (COUMADIN) 3 mg tablet Comments:   Reason for Stopping:         butalbital-acetaminophen-caff (FIORICET) 50-300-40 mg per capsule Comments:   Reason for Stopping:         cholecalciferol (VITAMIN D3) 1,000 unit cap Comments:   Reason for Stopping:         ondansetron (ZOFRAN ODT) 8 mg disintegrating tablet Comments:   Reason for Stopping:               Activity: as tolerated  Diet: cardiac      Follow-up  ??   Heme/onc in 1-2 weeks, PCP in 1-2 weeks  Time spent to discharge patient 35 minutes  Signed:  Thera FlakeAllison C Ayeden Gladman, DO  05/21/2015  6:08 PM

## 2015-05-21 NOTE — Progress Notes (Signed)
Morning bedside rounding report received from Tanya NonesErnesto M., RN.

## 2015-05-21 NOTE — Progress Notes (Signed)
Phenergan given for nausea.

## 2015-05-21 NOTE — Progress Notes (Signed)
Pain medication effective no further complaints.

## 2015-05-21 NOTE — Progress Notes (Signed)
Medicated with 2mg  Morphine IV for complaints of 7/10 left back pain.  Resting in bed with television on.

## 2015-05-21 NOTE — Progress Notes (Signed)
Morphine given for back pain.

## 2015-05-21 NOTE — Progress Notes (Signed)
Medicated with 2mg  Morphine IV for complaints of 7/10 left back pain. resting in bed with television on.  Will continue to monitor.

## 2015-05-21 NOTE — Progress Notes (Signed)
Phenergan effective no further complaints about nausea.

## 2015-05-21 NOTE — Progress Notes (Signed)
Medicated with 2mg  Morphine IV for complaints of 8/10 right sided back pain and 25 mg Phenergan PO for nausea.  Resting in bed with television on.  Demonstrating no signs of dyspnea or distress on room air.  No other concerns voiced at this time.  Anticipating discharge to home today.  Will continue to monitor.

## 2015-05-22 MED ORDER — HEPARIN, PORCINE (PF) 100 UNIT/ML IV SYRINGE
100 unit/mL | INTRAVENOUS | Status: DC | PRN
Start: 2015-05-22 — End: 2015-05-21

## 2015-05-22 MED FILL — MONOJECT PREFILL ADVANCED (PF) 100 UNIT/ML INTRAVENOUS SYRINGE: 100 unit/mL | INTRAVENOUS | Qty: 3

## 2015-05-23 NOTE — Progress Notes (Signed)
Per [facility] patient discharged to home 05/21/2015, patient is being followed by Calton DachLorie White, RN CCM . Care Coordinator will send patient discharge notification to RN CCM. NO TOC Outreach This note will not be viewable in MyChart.

## 2015-05-28 NOTE — Progress Notes (Signed)
MSSP/TOC follow up call to Ms. Dearcos, no answer, left a message.     Contacted Hematology office for follow up appointment per emergency room instructions, scheduled the next available date with Dr. Rose FillersQuddus for Jun 11, 2015 @ 10:00a.m. [facility] and their office will mail her paperwork to complete. Also, noticed that the Lovenox which was prescribed was given no refills and #10 quantity which will run out before this visit. I will see what I can do to help with getting this covered.     Contacted PCP [facility] - message requesting a PCP home there with office manager.

## 2015-06-01 NOTE — Progress Notes (Signed)
Contacted Shelva MajesticJessica Thompson to get a PCP home scheduled for Ms. Radloff. She will work on getting this for me. Also tried to reach the patient to let her know, had to leave a message. I will follow up again on Monday to finish setting up PCP follow up and check on status of the Lovenox and any other CM needs.

## 2015-06-02 ENCOUNTER — Inpatient Hospital Stay
Admit: 2015-06-02 | Discharge: 2015-06-02 | Disposition: A | Discharge status: Home / Self Care | Payer: MEDICARE | Attending: Emergency Medicine

## 2015-06-02 DIAGNOSIS — G43911 Migraine, unspecified, intractable, with status migrainosus: Secondary | ICD-10-CM

## 2015-06-02 MED ORDER — DIPHENHYDRAMINE HCL 50 MG/ML IJ SOLN
50 mg/mL | INTRAMUSCULAR | Status: AC
Start: 2015-06-02 — End: 2015-06-02
  Administered 2015-06-02: 23:00:00 via INTRAMUSCULAR

## 2015-06-02 MED ORDER — PROCHLORPERAZINE EDISYLATE 5 MG/ML INJECTION
5 mg/mL | INTRAMUSCULAR | Status: AC
Start: 2015-06-02 — End: 2015-06-02
  Administered 2015-06-02: 23:00:00 via INTRAMUSCULAR

## 2015-06-02 MED ORDER — NALBUPHINE 10 MG/ML INJECTION
10 mg/mL | INTRAMUSCULAR | Status: AC
Start: 2015-06-02 — End: 2015-06-02
  Administered 2015-06-02: 23:00:00 via INTRAMUSCULAR

## 2015-06-02 MED FILL — NALBUPHINE 10 MG/ML INJECTION: 10 mg/mL | INTRAMUSCULAR | Qty: 1

## 2015-06-02 MED FILL — PROCHLORPERAZINE EDISYLATE 5 MG/ML INJECTION: 5 mg/mL | INTRAMUSCULAR | Qty: 2

## 2015-06-02 MED FILL — DIPHENHYDRAMINE HCL 50 MG/ML IJ SOLN: 50 mg/mL | INTRAMUSCULAR | Qty: 1

## 2015-06-02 NOTE — ED Provider Notes (Signed)
HPI Comments: Patient presents with headache that started this morning. She states nausea and she vomited x1 prior to arrival. She states headache is similar to headache in the past. She denies visual changes and fever.     Patient is a 50 y.o. female presenting with headaches. [name]   Headache    This is a new problem. The current episode started 3 to 5 hours ago. The problem occurs constantly. The problem has not changed since onset.The headache is aggravated by nothing. The pain is located in the right unilateral region. The quality of the pain is described as throbbing. The pain is at a severity of 5/10. Associated symptoms include nausea and vomiting. Pertinent negatives include no anorexia, no fever, no malaise/fatigue, no chest pressure, no near-syncope, no orthopnea, no palpitations, no syncope, no shortness of breath, no weakness, no tingling, no dizziness and no visual change.        Past Medical History:   Diagnosis Date   ??? Anxiety    ??? Bipolar disorder (HCC)    ??? Calculus of kidney    ??? Depression    ??? Factor II deficiency (HCC)    ??? Factor V Leiden mutation (HCC)    ??? Hematuria, microscopic    ??? Infectious disease 02/2009     Hx MRSA / E.Coli bacteremia, MRSA wound infection 2/2 port   ??? Iron deficiency    ??? Migraines    ??? 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 Restless leg syndrome        Past Surgical History:   Procedure Laterality Date   ??? CYSTOSCOPY  ???    x 13   ??? HX APPENDECTOMY  ???   ??? HX CESAREAN SECTION     ??? HX ORTHOPAEDIC      left LE   ??? HX OTHER SURGICAL  02/13/2015    scar removal [facility] B lung [facility]   ??? HX OVARIAN CYST REMOVAL      on R   ??? HX TONSILLECTOMY     ??? HX TUBAL LIGATION     ??? HX VASCULAR ACCESS  July, 2010    Power Port placed in [place], [place] removed   ??? HX VASCULAR ACCESS Right 2013    pt has current rt subclavian port - placed at [facility]   ??? LITHOTRIPSY  2006          Family History:   Problem Relation Age of Onset   ??? Bleeding Prob Father      Factor V   ??? Ovarian Cancer Mother 3     passed away due to ca   ??? Bipolar Disorder Mother    ??? Kidney Disease Brother      kidney stones   ??? Cancer Maternal Grandmother      metastatic breast CA.       Social History     Social History   ??? Marital status: DIVORCED     Spouse name: N/A   ??? Number of children: N/A   ??? Years of education: N/A     Occupational History   ??? Not on file.     Social History Main Topics   ??? Smoking status: Never Smoker   ??? Smokeless tobacco: Never Used   ??? Alcohol use Yes      Comment: rarely - only drinks 2 times per year   ???  Drug use: No      Comment: narcotic seeking per hospital hx.   ??? Sexual activity: Yes     Partners: Male     Birth control/ protection: Surgical     Other Topics Concern   ??? Not on file     Social History Narrative         ALLERGIES: Ativan [lorazepam]; Codeine; Demerol [meperidine]; Hydrocodone-ibuprofen; Iodinated contrast media - oral and iv dye; Ketorolac tromethamine; Pcn [penicillins]; Pneumococcal vaccine; Pneumovax 23 [pneumococcal 23-val ps vaccine]; and Shellfish containing products    Review of Systems   Constitutional: Negative for fever and malaise/fatigue.   HENT: Negative for congestion.    Respiratory: Negative for shortness of breath.    Cardiovascular: Negative for chest pain, palpitations, orthopnea, syncope and near-syncope.   Gastrointestinal: Positive for nausea and vomiting. Negative for anorexia.   Neurological: Positive for headaches. Negative for dizziness, tingling, syncope, weakness and numbness.       Vitals:    06/02/15 1800   BP: 144/81   Pulse: 84   Resp: 18   Temp: 98.1 ??F (36.7 ??C)   SpO2: 93%            Physical Exam   Constitutional: She is oriented to person, place, and time. She appears well-developed and well-nourished. No distress.   Cardiovascular: Normal rate, regular rhythm and normal heart sounds.    No murmur heard.   Pulmonary/Chest: Effort normal and breath sounds normal. No respiratory distress. She has no wheezes.   Abdominal: Soft. Bowel sounds are normal. She exhibits no distension. There is no tenderness.   Musculoskeletal: Normal range of motion.   Neurological: She is alert and oriented to person, place, and time. No cranial nerve deficit or sensory deficit. GCS eye subscore is 4. GCS verbal subscore is 5. GCS motor subscore is 6.   Skin: She is not diaphoretic.   Nursing note and vitals reviewed.       MDM  Number of Diagnoses or Management Options  Intractable migraine with status migrainosus, unspecified migraine type:   Diagnosis management comments: Patient given IM nubaine, IM compazine, and IM benadryl. Discharged to follow up with her pcp       Amount and/or Complexity of Data Reviewed  Tests in the medicine section of CPT??: ordered    Patient Progress  Patient progress: stable    ED Course       Procedures

## 2015-06-02 NOTE — ED Triage Notes (Signed)
Reports onset of headache this afternoon.

## 2015-06-02 NOTE — ED Notes (Signed)
I have reviewed discharge instructions with the patient.  The patient verbalized understanding.

## 2015-06-03 ENCOUNTER — Emergency Department: Admit: 2015-06-04 | Payer: MEDICARE | Primary: Student in an Organized Health Care Education/Training Program

## 2015-06-03 ENCOUNTER — Inpatient Hospital Stay
Admit: 2015-06-03 | Discharge: 2015-06-04 | Disposition: A | Discharge status: Home / Self Care | Payer: MEDICARE | Attending: Emergency Medicine

## 2015-06-03 DIAGNOSIS — R079 Chest pain, unspecified: Secondary | ICD-10-CM

## 2015-06-03 NOTE — ED Triage Notes (Addendum)
Pt c/o mid-sternal chest pain radiating into back x 1 day. PT denies any shortness of breath. Pt states non-productive cough. Pt states nausea but denies any vomiting. Pt states she had a heart cath last month a [facility]. Pt alert and oriented  X 4. Respirations are even and unlabored. Pt appears in no acute distress at this time. No labs drawn in triage d/t pt requesting her port be assessed.

## 2015-06-03 NOTE — ED Provider Notes (Signed)
HPI Comments: Patient reports she stopped taking her Lovenox in order to have a dental procedure and began having chest pain earlier today.  Recently rediagnosed with pulmonary embolism on a VQ scan.    Patient is a 50 y.o. female presenting with chest pain. [name]   Chest Pain    This is a recurrent problem. The current episode started 6 to 12 hours ago. The problem has not changed since onset.The problem occurs constantly. The pain is present in the substernal region. The pain is at a severity of 8/10. The quality of the pain is described as sharp. The pain does not radiate. Associated symptoms include shortness of breath. Pertinent negatives include no abdominal pain, no back pain, no cough, no diaphoresis, no fever, no headaches, no nausea, no numbness, no vomiting and no weakness. She has tried nothing for the symptoms. Her past medical history is significant for PE.       Past Medical History:   Diagnosis Date   ??? Anxiety    ??? Bipolar disorder (HCC)    ??? Calculus of kidney    ??? Depression    ??? Factor II deficiency (HCC)    ??? Factor V Leiden mutation (HCC)    ??? Hematuria, microscopic    ??? Infectious disease 02/2009     Hx MRSA / E.Coli bacteremia, MRSA wound infection 2/2 port   ??? Iron deficiency    ??? Migraines    ??? 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 Restless leg syndrome        Past Surgical History:   Procedure Laterality Date   ??? CYSTOSCOPY  ???    x 13   ??? HX APPENDECTOMY  ???   ??? HX CESAREAN SECTION     ??? HX ORTHOPAEDIC      left LE   ??? HX OTHER SURGICAL  02/13/2015    scar removal from B lung at [facility]   ??? HX OVARIAN CYST REMOVAL      on R   ??? HX TONSILLECTOMY     ??? HX TUBAL LIGATION     ??? HX VASCULAR ACCESS  July, 2010    Power Port placed in [place], [place] removed   ??? HX VASCULAR ACCESS Right 2013    pt has current rt subclavian port - placed at [facility]   ??? LITHOTRIPSY  2006         Family History:    Problem Relation Age of Onset   ??? Bleeding Prob Father      Factor V   ??? Ovarian Cancer Mother 58     passed away due to ca   ??? Bipolar Disorder Mother    ??? Kidney Disease Brother      kidney stones   ??? Cancer Maternal Grandmother      metastatic breast CA.       Social History     Social History   ??? Marital status: DIVORCED     Spouse name: N/A   ??? Number of children: N/A   ??? Years of education: N/A     Occupational History   ??? Not on file.     Social History Main Topics   ??? Smoking status: Never Smoker   ??? Smokeless tobacco: Never Used   ??? Alcohol use Yes      Comment: rarely - only drinks 2 times per year   ???  Drug use: No      Comment: narcotic seeking per hospital hx.   ??? Sexual activity: Yes     Partners: Male     Birth control/ protection: Surgical     Other Topics Concern   ??? Not on file     Social History Narrative         ALLERGIES: Ativan [lorazepam]; Codeine; Demerol [meperidine]; Hydrocodone-ibuprofen; Iodinated contrast media - oral and iv dye; Ketorolac tromethamine; Pcn [penicillins]; Pneumococcal vaccine; Pneumovax 23 [pneumococcal 23-val ps vaccine]; and Shellfish containing products    Review of Systems   Constitutional: Negative for chills, diaphoresis and fever.   HENT: Negative for congestion, rhinorrhea and sore throat.    Eyes: Negative for photophobia and redness.   Respiratory: Positive for shortness of breath. Negative for cough.    Cardiovascular: Positive for chest pain. Negative for leg swelling.   Gastrointestinal: Negative for abdominal pain, diarrhea, nausea and vomiting.   Endocrine: Negative for polydipsia and polyuria.   Genitourinary: Negative for dysuria.   Musculoskeletal: Negative for back pain and myalgias.   Neurological: Negative for weakness, numbness and headaches.       Vitals:    06/03/15 1945   BP: 144/69   Resp: 16   Temp: 100 ??F (37.8 ??C)   SpO2: 97%   Weight: 61.7 kg (136 lb)   Height: 5\' 4"  (1.626 m)            Physical Exam    Constitutional: She is oriented to person, place, and time. She appears well-developed and well-nourished.   Eyes: Conjunctivae are normal. Pupils are equal, round, and reactive to light.   Neck: Normal range of motion. Neck supple.   Cardiovascular: Normal rate, regular rhythm and normal heart sounds.    No murmur heard.  Pulmonary/Chest: Breath sounds normal. No respiratory distress.   Abdominal: Soft. She exhibits no distension. There is no tenderness. There is no rebound and no guarding.   Musculoskeletal: Normal range of motion. She exhibits no edema or tenderness.   Neurological: She is alert and oriented to person, place, and time. She has normal strength. No sensory deficit.   Skin: Skin is warm and dry.        MDM  Number of Diagnoses or Management Options  Diagnosis management comments: Patient with history of opiate dependence and drug-seeking behavior.   Patient with manipulative behavior in the past and currently claims she stopped her Lovenox to have a dental procedure for dental pain.  Complains of pleuritic chest pain.  Vitals are stable.  Oxygen saturations are normal.  She is not tachycardic.  We will dose with Lovenox but I do not see a need for repeat CT scan or VQ scan.  Patient will not be given narcotic medication in the emergency department.       Amount and/or Complexity of Data Reviewed  Clinical lab tests: ordered and reviewed  Tests in the radiology section of CPT??: ordered and reviewed      ED Course       Procedures

## 2015-06-03 NOTE — ED Notes (Signed)
"  I just got out of the hospital 2 weeks ago today but I dont see that lung doctor until the 10th.  I was in for multiple blood clots but I quit taking my blood thinner Thursday because I have to get a tooth pulled Tuesday.  Today I started hurting in my chest and lungs"

## 2015-06-03 NOTE — ED Notes (Signed)
Patient discharged to waiting room to wait on van.  [facility] contacted and will arrive to pick up patient between 30 minutes to 3 hours.  Patient given discharge, medication, and follow up instructions.  Patient verbalized understanding and had no questions.

## 2015-06-04 LAB — EKG, 12 LEAD, INITIAL
Atrial Rate: 88 {beats}/min
Calculated P Axis: 72 degrees
Calculated R Axis: 92 degrees
Calculated T Axis: 81 degrees
Diagnosis: NORMAL
P-R Interval: 152 ms
Q-T Interval: 352 ms
QRS Duration: 90 ms
QTC Calculation (Bezet): 425 ms
Ventricular Rate: 88 {beats}/min

## 2015-06-04 LAB — METABOLIC PANEL, COMPREHENSIVE
A-G Ratio: 1.1 — ABNORMAL LOW (ref 1.2–3.5)
ALT (SGPT): 21 U/L (ref 12–65)
AST (SGOT): 12 U/L — ABNORMAL LOW (ref 15–37)
Albumin: 3.6 g/dL (ref 3.5–5.0)
Alk. phosphatase: 114 U/L (ref 50–136)
Anion gap: 9 mmol/L (ref 7–16)
BUN: 17 MG/DL (ref 6–23)
Bilirubin, total: 0.2 MG/DL (ref 0.2–1.1)
CO2: 27 mmol/L (ref 21–32)
Calcium: 8.9 MG/DL (ref 8.3–10.4)
Chloride: 107 mmol/L (ref 98–107)
Creatinine: 0.84 MG/DL (ref 0.6–1.0)
GFR est AA: >60 mL/min/{1.73_m2} (ref >60)
GFR est non-AA: >60 mL/min/{1.73_m2} (ref >60)
Globulin: 3.3 g/dL (ref 2.3–3.5)
Glucose: 93 mg/dL (ref 65–100)
Potassium: 3.8 mmol/L (ref 3.5–5.1)
Protein, total: 6.9 g/dL (ref 6.3–8.2)
Sodium: 143 mmol/L (ref 136–145)

## 2015-06-04 LAB — CBC WITH AUTOMATED DIFF
ABS. BASOPHILS: 0 10*3/uL (ref 0.0–0.2)
ABS. EOSINOPHILS: 0.4 10*3/uL (ref 0.0–0.8)
ABS. IMM. GRANS.: 0 10*3/uL (ref 0.0–0.5)
ABS. LYMPHOCYTES: 2.3 10*3/uL (ref 0.5–4.6)
ABS. MONOCYTES: 0.5 10*3/uL (ref 0.1–1.3)
ABS. NEUTROPHILS: 3.1 10*3/uL (ref 1.7–8.2)
BASOPHILS: 1 % (ref 0.0–2.0)
EOSINOPHILS: 6 % (ref 0.5–7.8)
HCT: 29.3 % — ABNORMAL LOW (ref 35.8–46.3)
HGB: 9.2 g/dL — ABNORMAL LOW (ref 11.7–15.4)
IMMATURE GRANULOCYTES: 0.2 % (ref 0.0–5.0)
LYMPHOCYTES: 36 % (ref 13–44)
MCH: 26 PG — ABNORMAL LOW (ref 26.1–32.9)
MCHC: 31.4 g/dL (ref 31.4–35.0)
MCV: 82.8 FL (ref 79.6–97.8)
MONOCYTES: 8 % (ref 4.0–12.0)
MPV: 10.7 FL — ABNORMAL LOW (ref 10.8–14.1)
NEUTROPHILS: 49 % (ref 43–78)
PLATELET: 287 10*3/uL (ref 150–450)
RBC: 3.54 M/uL — ABNORMAL LOW (ref 4.05–5.25)
RDW: 15.5 % — ABNORMAL HIGH (ref 11.9–14.6)
WBC: 6.4 10*3/uL (ref 4.3–11.1)

## 2015-06-04 LAB — POC TROPONIN: Troponin-I (POC): 0 ng/ml (ref 0.0–0.08)

## 2015-06-04 MED ORDER — ENOXAPARIN 100 MG/ML SUB-Q SYRINGE
100 mg/mL | SUBCUTANEOUS | Status: AC
Start: 2015-06-04 — End: 2015-06-03
  Administered 2015-06-04: 01:00:00 via SUBCUTANEOUS

## 2015-06-04 MED ORDER — ENOXAPARIN 30 MG/0.3 ML SUB-Q SYRINGE
30 mg/0.3 mL | SUBCUTANEOUS | Status: DC
Start: 2015-06-04 — End: 2015-06-03

## 2015-06-04 MED ORDER — ACETAMINOPHEN 325 MG TABLET
325 mg | ORAL | Status: AC
Start: 2015-06-04 — End: 2015-06-03
  Administered 2015-06-04: 01:00:00 via ORAL

## 2015-06-04 MED ORDER — HEPARIN, PORCINE (PF) 100 UNIT/ML IV SYRINGE
100 unit/mL | INTRAVENOUS | Status: DC | PRN
Start: 2015-06-04 — End: 2015-06-04
  Administered 2015-06-04: 03:00:00

## 2015-06-04 MED ORDER — ENOXAPARIN 60 MG/0.6 ML SUB-Q SYRINGE
60 mg/0.6 mL | SUBCUTANEOUS | Status: DC
Start: 2015-06-04 — End: 2015-06-03

## 2015-06-04 MED ORDER — ENOXAPARIN 100 MG/ML SUB-Q SYRINGE
100 mg/mL | INJECTION | SUBCUTANEOUS | 1 refills | Status: DC
Start: 2015-06-04 — End: 2015-07-25

## 2015-06-04 MED FILL — LOVENOX 100 MG/ML SUBCUTANEOUS SYRINGE: 100 mg/mL | SUBCUTANEOUS | Qty: 10

## 2015-06-04 MED FILL — MONOJECT PREFILL ADVANCED (PF) 100 UNIT/ML INTRAVENOUS SYRINGE: 100 unit/mL | INTRAVENOUS | Qty: 3

## 2015-06-04 MED FILL — LOVENOX 100 MG/ML SUBCUTANEOUS SYRINGE: 100 mg/mL | SUBCUTANEOUS | Qty: 1

## 2015-06-04 MED FILL — LOVENOX 60 MG/0.6 ML SUBCUTANEOUS SYRINGE: 60 mg/0.6 mL | SUBCUTANEOUS | Qty: 0.6

## 2015-06-04 MED FILL — TYLENOL 325 MG TABLET: 325 mg | ORAL | Qty: 2

## 2015-06-04 NOTE — Progress Notes (Signed)
This patient was in the ED 4/22 and 4/23 and is engaged with CCM Mechele DawleyLori White RN.  Notified CCM engaged and as indicated who will [facility].          Krista GoodnessViccarra Coker, LPN/ Care Coordinator  Mobile:[phone]  [address] [facility]  [address], [address] / [place], [place] [id number]  [email or web address] note will not be viewable in MyChart.

## 2015-06-05 NOTE — Progress Notes (Signed)
TOC call to Krista Lopez, to follow up on her most recent ED visit. She answered the phone and then hung up on me. I tried to call again, she hung up again. I will attempt another outreach later i the week. I did inform her in an earlier phone message last week about the PCP visit that is scheduled as well as her hematology appointment for May.

## 2015-06-06 DIAGNOSIS — K0889 Other specified disorders of teeth and supporting structures: Secondary | ICD-10-CM

## 2015-06-06 NOTE — ED Provider Notes (Signed)
HPI Comments: Patient is a 50 yo female who is coming in with right sided upper dental pain for the last 2 weeks.  She has a complicated history of chronic migraines requiring nubain for relief with multiple ed visits for this.  Patient states no one will pull her tooth because she is on blood thinners. She states the pain has made her migraines hurt as well.  Pain is constant and 8/10.  No fevers or chills.       Patient is a 50 y.o. female presenting with dental problem. [name]   Dental Pain             Past Medical History:   Diagnosis Date   ??? Anxiety    ??? Bipolar disorder (HCC)    ??? Calculus of kidney    ??? Depression    ??? Factor II deficiency (HCC)    ??? Factor V Leiden mutation (HCC)    ??? Hematuria, microscopic    ??? Infectious disease 02/2009     Hx MRSA / E.Coli bacteremia, MRSA wound infection 2/2 port   ??? Iron deficiency    ??? Migraines    ??? 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 Restless leg syndrome        Past Surgical History:   Procedure Laterality Date   ??? CYSTOSCOPY  ???    x 13   ??? HX APPENDECTOMY  ???   ??? HX CESAREAN SECTION     ??? HX ORTHOPAEDIC      left LE   ??? HX OTHER SURGICAL  02/13/2015    scar removal from B lung at [facility]   ??? HX OVARIAN CYST REMOVAL      on R   ??? HX TONSILLECTOMY     ??? HX TUBAL LIGATION     ??? HX VASCULAR ACCESS  July, 2010    Power Port placed in [place], [place] removed   ??? HX VASCULAR ACCESS Right 2013    pt has current rt subclavian port - placed at [facility]   ??? LITHOTRIPSY  2006         Family History:   Problem Relation Age of Onset   ??? Bleeding Prob Father      Factor V   ??? Ovarian Cancer Mother 68     passed away due to ca   ??? Bipolar Disorder Mother    ??? Kidney Disease Brother      kidney stones   ??? Cancer Maternal Grandmother      metastatic breast CA.       Social History     Social History   ??? Marital status: DIVORCED     Spouse name: N/A   ??? Number of children: N/A    ??? Years of education: N/A     Occupational History   ??? Not on file.     Social History Main Topics   ??? Smoking status: Never Smoker   ??? Smokeless tobacco: Never Used   ??? Alcohol use Yes      Comment: rarely - only drinks 2 times per year   ??? Drug use: No      Comment: narcotic seeking per hospital hx.   ??? Sexual activity: Yes     Partners: Male     Birth control/ protection: Surgical     Other Topics Concern   ??? Not on file  Social History Narrative         ALLERGIES: Ativan [lorazepam]; Codeine; Demerol [meperidine]; Hydrocodone-ibuprofen; Iodinated contrast media - oral and iv dye; Ketorolac tromethamine; Pcn [penicillins]; Pneumococcal vaccine; Pneumovax 23 [pneumococcal 23-val ps vaccine]; and Shellfish containing products    Review of Systems   Constitutional: Negative for chills and fever.   HENT: Positive for dental problem.    Respiratory: Negative for cough, chest tightness, shortness of breath, wheezing and stridor.    Cardiovascular: Negative for chest pain and palpitations.   Gastrointestinal: Negative for abdominal pain, diarrhea, nausea and vomiting.   Skin: Negative.    All other systems reviewed and are negative.      Vitals:    06/06/15 2218   BP: 122/75   Pulse: 83   Resp: 18   Temp: 98.7 ??F (37.1 ??C)   SpO2: 96%   Weight: 64 kg (141 lb 3.2 oz)   Height: 5' 4.5" (1.638 m)            Physical Exam   Constitutional: She is oriented to person, place, and time. She appears well-developed and well-nourished. No distress.   HENT:   Head: Normocephalic and atraumatic.   Patient has broken right upper canine.  No abscess present   Eyes: Conjunctivae are normal. No scleral icterus.   Neck: Normal range of motion. Neck supple.   Pulmonary/Chest: Effort normal. No stridor. No respiratory distress.   Abdominal: Soft. She exhibits no distension. There is no tenderness. There is no rebound and no guarding.   Neurological: She is alert and oriented to person, place, and time.   No focal weakness    Skin: Skin is warm and dry. No rash noted. She is not diaphoretic. No erythema.   Psychiatric: She has a normal mood and affect. Her behavior is normal.   Nursing note and vitals reviewed.       MDM  Number of Diagnoses or Management Options  Intractable migraine with status migrainosus, unspecified migraine type:   Pain, dental:   Diagnosis management comments: Patient has no abscess present she declined nerve block.  I did treat pain in ED and refer to oral surgeon.      Standley Brookingoben D Feige Lowdermilk, MD; 06/06/2015 @11 :46 PM Voice dictation software was used during the making of this note.  This software is not perfect and grammatical and other typographical errors may be present.  This note has not been proofread for errors.  ===================================================================     ED Course       Procedures

## 2015-06-06 NOTE — ED Triage Notes (Signed)
Complaints of cracked tooth. States dentist will not remove tooth while on blood thinners, but if she goes off blood thinners, will develop a clot. States facial pain from tooth has sent her into a migraine. States has taken imitrex and ibu 600 without relief of symptoms.

## 2015-06-06 NOTE — ED Notes (Signed)
PT c/o headache stemming from cracked tooth.

## 2015-06-07 ENCOUNTER — Inpatient Hospital Stay
Admit: 2015-06-07 | Discharge: 2015-06-07 | Disposition: A | Discharge status: Home / Self Care | Payer: MEDICARE | Attending: Emergency Medicine

## 2015-06-07 MED ORDER — NALBUPHINE 10 MG/ML INJECTION
10 mg/mL | INTRAMUSCULAR | Status: AC
Start: 2015-06-07 — End: 2015-06-06
  Administered 2015-06-07: 04:00:00 via INTRAMUSCULAR

## 2015-06-07 MED ORDER — PROCHLORPERAZINE EDISYLATE 5 MG/ML INJECTION
5 mg/mL | INTRAMUSCULAR | Status: AC
Start: 2015-06-07 — End: 2015-06-06
  Administered 2015-06-07: 04:00:00 via INTRAMUSCULAR

## 2015-06-07 MED ORDER — DIPHENHYDRAMINE HCL 50 MG/ML IJ SOLN
50 mg/mL | INTRAMUSCULAR | Status: AC
Start: 2015-06-07 — End: 2015-06-06
  Administered 2015-06-07: 04:00:00 via INTRAMUSCULAR

## 2015-06-07 MED FILL — DIPHENHYDRAMINE HCL 50 MG/ML IJ SOLN: 50 mg/mL | INTRAMUSCULAR | Qty: 1

## 2015-06-07 MED FILL — PROCHLORPERAZINE EDISYLATE 5 MG/ML INJECTION: 5 mg/mL | INTRAMUSCULAR | Qty: 2

## 2015-06-07 MED FILL — NALBUPHINE 10 MG/ML INJECTION: 10 mg/mL | INTRAMUSCULAR | Qty: 1

## 2015-06-07 NOTE — ED Notes (Signed)
I have reviewed discharge instructions with the patient.  The patient verbalized understanding. Patient was not given prescription. Pt ambulatory upon discharge home.

## 2015-06-08 ENCOUNTER — Inpatient Hospital Stay
Admit: 2015-06-08 | Discharge: 2015-06-08 | Disposition: A | Discharge status: Home / Self Care | Payer: MEDICARE | Attending: Emergency Medicine

## 2015-06-08 ENCOUNTER — Emergency Department: Admit: 2015-06-08 | Payer: MEDICARE | Primary: Student in an Organized Health Care Education/Training Program

## 2015-06-08 DIAGNOSIS — Z765 Malingerer [conscious simulation]: Secondary | ICD-10-CM

## 2015-06-08 LAB — EKG, 12 LEAD, INITIAL
Atrial Rate: 96 {beats}/min
Calculated P Axis: 76 degrees
Calculated R Axis: 91 degrees
Calculated T Axis: 79 degrees
Diagnosis: NORMAL
P-R Interval: 140 ms
Q-T Interval: 350 ms
QRS Duration: 90 ms
QTC Calculation (Bezet): 442 ms
Ventricular Rate: 96 {beats}/min

## 2015-06-08 MED ORDER — HEPARIN, PORCINE (PF) 100 UNIT/ML IV SYRINGE
100 unit/mL | INTRAVENOUS | Status: DC | PRN
Start: 2015-06-08 — End: 2015-06-08
  Administered 2015-06-08: 18:00:00

## 2015-06-08 MED FILL — MONOJECT PREFILL ADVANCED (PF) 100 UNIT/ML INTRAVENOUS SYRINGE: 100 unit/mL | INTRAVENOUS | Qty: 3

## 2015-06-08 NOTE — ED Notes (Signed)
Discharge instructions given to pt. Pt verbalized understanding. Pt ambulatory to waiting room. [facility] called for pt to get home.

## 2015-06-08 NOTE — ED Provider Notes (Signed)
HPI Comments: Patient continues to complain of right upper premolar dental pain and complains that she has to be off or blood thinner in order to have the tooth removed.  Complains of chronic intermittent chest pain.  States she did not take her Lovenox today in attempts to get the tooth removed.  Patient just spent and evening in the emergency Department for migraine headache.  Patient chronically in and out of the emergency department seeking pain medication.    Patient is a 50 y.o. female presenting with dental problem. [name]   Dental Pain    This is a chronic problem.          Past Medical History:   Diagnosis Date   ??? Anxiety    ??? Bipolar disorder (HCC)    ??? Calculus of kidney    ??? Depression    ??? Factor II deficiency (HCC)    ??? Factor V Leiden mutation (HCC)    ??? Hematuria, microscopic    ??? Infectious disease 02/2009     Hx MRSA / E.Coli bacteremia, MRSA wound infection 2/2 port   ??? Iron deficiency    ??? Migraines    ??? 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 Restless leg syndrome        Past Surgical History:   Procedure Laterality Date   ??? CYSTOSCOPY  ???    x 13   ??? HX APPENDECTOMY  ???   ??? HX CESAREAN SECTION     ??? HX ORTHOPAEDIC      left LE   ??? HX OTHER SURGICAL  02/13/2015    scar removal [facility] B lung at [facility]   ??? HX OVARIAN CYST REMOVAL      on R   ??? HX TONSILLECTOMY     ??? HX TUBAL LIGATION     ??? HX VASCULAR ACCESS  July, 2010    Power Port placed in [place], [place] removed   ??? HX VASCULAR ACCESS Right 2013    pt has current rt subclavian port - placed at [facility]   ??? LITHOTRIPSY  2006         Family History:   Problem Relation Age of Onset   ??? Bleeding Prob Father      Factor V   ??? Ovarian Cancer Mother 55     passed away due to ca   ??? Bipolar Disorder Mother    ??? Kidney Disease Brother      kidney stones   ??? Cancer Maternal Grandmother      metastatic breast CA.       Social History     Social History    ??? Marital status: DIVORCED     Spouse name: N/A   ??? Number of children: N/A   ??? Years of education: N/A     Occupational History   ??? Not on file.     Social History Main Topics   ??? Smoking status: Never Smoker   ??? Smokeless tobacco: Never Used   ??? Alcohol use Yes      Comment: rarely - only drinks 2 times per year   ??? Drug use: No      Comment: narcotic seeking per hospital hx.   ??? Sexual activity: Yes     Partners: Male     Birth control/ protection: Surgical     Other Topics Concern   ??? Not  on file     Social History Narrative         ALLERGIES: Ativan [lorazepam]; Codeine; Demerol [meperidine]; Hydrocodone-ibuprofen; Iodinated contrast media - oral and iv dye; Ketorolac tromethamine; Pcn [penicillins]; Pneumococcal vaccine; Pneumovax 23 [pneumococcal 23-val ps vaccine]; and Shellfish containing products    Review of Systems   Constitutional: Negative for fever.   HENT: Positive for dental problem.    Respiratory: Negative for shortness of breath.    Cardiovascular: Positive for chest pain.   Gastrointestinal: Negative for nausea and vomiting.       Vitals:    06/08/15 1133 06/08/15 1339 06/08/15 1340   BP: 102/55 127/73    Pulse: 94  90   Resp: 16     Temp: 98.6 ??F (37 ??C)     SpO2: 97%  98%   Weight: 64 kg (141 lb)     Height: 5' 4.5" (1.638 m)              Physical Exam   Constitutional: She appears well-developed and well-nourished.   HENT:   Mouth/Throat: Oropharynx is clear and moist. No oropharyngeal exudate.       Cardiovascular: Normal rate, regular rhythm and normal heart sounds.    Pulmonary/Chest: Effort normal and breath sounds normal.   Abdominal: Soft. She exhibits no distension. There is no tenderness. There is no rebound and no guarding.   Nursing note and vitals reviewed.       MDM  Number of Diagnoses or Management Options  Diagnosis management comments: Patient is hemodynamically stable and in no acute distress.  Patient continues drug-seeking behavior.  Patient needs  to take her Lovenox and see a dentist regarding her tooth pain.  Patient again referred to [employer] website to seek a treatment center.    ED Course       Procedures

## 2015-06-08 NOTE — Progress Notes (Signed)
Transition of Care Discharge Follow-up Questionnaire   Date/Time of Call:   06/08/15 4:00p.m.   What was the patient hospitalized for? Chest pain/dental pain   Does the patient understand his/her diagnosis and/or treatment and what happened during the hospitalization?     yes   Did the patient receive discharge instructions?   yes   Review any discharge instructions (see notes in [facility]).  Ask patient if they understand these.  Do they have any questions?     Has no questions   Were home services ordered (nursing, PT, OT, ST, etc.)?   no     If so, has the first visit occurred? If not, why? (Assist with coordination of services if necessary.)          Was any DME ordered? no     If so, has it been received?  If not, why?  (Assist with coordination of arranging DME orders if necessary.)          Complete a review of all medications (new, continued and discontinued meds per the D/C instructions and medication tab in [facility]).   reviewed         Were all new prescriptions filled?  If not, why?  (Assist with obtainment of medications if necessary.)     yes     Does the patient understand the purpose and dosing instructions for all medications?  (If patient has questions, provide explanation and education.) yes   Does the patient have any problems in performing ADL???s? (If patient is unable to perform ADLs ??? what is the limiting factor(s)?  Do they have a support system that can assist? If no support system is present, discuss possible assistance that they may be able to obtain.)         no     Does the patient have all follow-up appointments scheduled?  Has transportation been arranged?  [facility] follow-up should be within 7 days of discharge; all others should have PCP follow-up within 7 days of discharge; follow-ups with other specialists as appropriate or ordered.) Yes and she verbalized to me she would be attending both hematology and new PCP appointments and she states she has transportation    Any other questions or concerns expressed by the patient?     no   [facility] or refer to RN Case Manager/Social Services Case Manager as appropriate. 06/18/15   TOC Call Completed By:   Tomma RakersLorie Marjorie Lussier,RN,CCM

## 2015-06-08 NOTE — Progress Notes (Signed)
Will notify engaged CM [facility] regarding ED visits today and yesterday as she is enagged and on patients care team per CC.  Notified of [facility].          Alcide GoodnessViccarra Coker, LPN/ Care Coordinator  Mobile:[phone]  [address] [facility]  [address], [address] / [place], [place] [id number]  [email or web address] note will not be viewable in MyChart.

## 2015-06-08 NOTE — ED Triage Notes (Signed)
Per patient left sided chest pain radiating through left side of back, "it feels like something is being pulled through my chest" pain started at 0400 this morning.  2 ntg with ems no relief. 324asa.at home. Patient has port and would like it accessed in the back.

## 2015-06-11 ENCOUNTER — Encounter: Attending: Hematology & Oncology | Primary: Student in an Organized Health Care Education/Training Program

## 2015-06-12 ENCOUNTER — Encounter: Payer: MEDICARE | Attending: Internal Medicine | Primary: Student in an Organized Health Care Education/Training Program

## 2015-06-19 NOTE — Progress Notes (Signed)
CCM outreach call, no answer, left message. After repeated attempts to schedule Krista Lopez with PCP and Specialty follow up appointments, she continues to not show up for these even after I have offered to help coordinate transportation and reminder calls. I am not sure the patient is willing to make efforts to follow up outside of the emergency room.

## 2015-07-23 ENCOUNTER — Encounter: Payer: MEDICARE | Attending: Registered Nurse | Primary: Student in an Organized Health Care Education/Training Program

## 2015-07-23 NOTE — Telephone Encounter (Signed)
Patient was a no show for appointment [facility]23/[facility] [place]/brg disc of ct/cld radiology req fax .  Please Review.

## 2015-07-23 NOTE — Telephone Encounter (Signed)
Noted.

## 2015-07-25 ENCOUNTER — Ambulatory Visit
Admit: 2015-07-25 | Discharge: 2015-07-25 | Payer: MEDICARE | Attending: Registered Nurse | Primary: Student in an Organized Health Care Education/Training Program

## 2015-07-25 DIAGNOSIS — R109 Unspecified abdominal pain: Secondary | ICD-10-CM

## 2015-07-25 LAB — AMB POC URINALYSIS DIP STICK AUTO W/ MICRO (PGU)
Bilirubin (UA POC): NEGATIVE
Glucose (UA POC): NEGATIVE mg/dL
Ketones (UA POC): NEGATIVE
Nitrites (UA POC): NEGATIVE
Protein (UA POC): 30
Specific gravity (UA POC): 1.025 (ref 1.001–1.035)
Urobilinogen (POC): 0.2
pH (UA POC): 5.5 (ref 4.6–8.0)

## 2015-07-25 MED ORDER — HYDROCODONE-ACETAMINOPHEN 5 MG-325 MG TAB
5-325 mg | ORAL_TABLET | Freq: Four times a day (QID) | ORAL | 0 refills | Status: DC | PRN
Start: 2015-07-25 — End: 2015-11-08

## 2015-07-25 MED ORDER — NITROFURANTOIN MACROCRYSTAL 100 MG CAP
100 mg | ORAL_CAPSULE | Freq: Two times a day (BID) | ORAL | 0 refills | Status: AC
Start: 2015-07-25 — End: 2015-08-01

## 2015-07-25 NOTE — Progress Notes (Signed)
[facility]  [address]  [place], [place] [address]  [phone]    Krista Lopez  DOB: 22-Nov-1965    Chief Complaint   Patient presents with   ??? Flank Pain     right          HPI     Krista Lopez is a 50 y.o. female presents today for stone follow up.    She is a patient of Dr. Archie Patten. Known hx of stones.    Visiting friends in [place], [place] and had new onset of difficulty breathing. Admitted to hospital 07-12-15 for newly dx PE. She has known hx of clotting disorders. Subsequently developed right flank. Had CT that confirmed 2 mm right ureteral stone.  There is no documentation available for review of this visit. Patient did not obtain her medical records or CT from this visit as requested by our office.    She then missed her follow up appointment with this office 2 days ago August 06, 2015. She thought she had passed the stone and was having no more pain.    She then presented to [facility] ER yesterday 07-24-15 for right sided flank pain. She was provided tramadol and zofran and advised to follow up with this office. No imaging was obtained. It is noted in providers note that they were also unable to obtain records from hospital in [place], [place] where patient was previously seen. No UC or micro performed.     07-25-15 She continues to report right sided flank pain. No dysuria, fever, chills, n/v, gross hematuria, or difficulty urinating. Reports mild relief of pain with Ultram. She is out of this prescription. Has not used Zofran. Was not started on Flomax.           Recent imaging per EMR:    CT Abd/Pelvis WO contrast at [facility] 05/06/15  FINDINGS:  ??  ABDOMEN:  Mild dependent subsegmental atelectasis bilateral lung bases. Normal-appearing  liver, gallbladder, spleen, pancreas, bilateral adrenal glands and left kidney.   Punctate nonobstructing calculi in the right renal medullary level measuring up  to 2 mm in diameter unchanged. Infrarenal IVC filter appears to remain in   anatomic position. Normal caliber abdominal aorta. No evidence of significant  lymphadenopathy. Normal-appearing small bowel. No evidence of intraperitoneal  free air or free fluid.  ??  PELVIS:  Normal-appearing urinary bladder. Normal-appearing uterus and bilateral adnexa.  Normal-appearing colon. No evidence of pelvic free fluid. No evidence of  significant inguinal or pelvic sidewall lymphadenopathy. Visualized osseous  structures unremarkable.  ??  IMPRESSION  IMPRESSION:   ??  1. No acute pathology identified.  2. Other chronic findings as above.        RUS 06/24/15 at [facility]    Findings: The right kidney measures 8.4 cm longitudinally and demonstrates normal cortical thickness and echotexture without hydronephrosis or shadowing calculi. The left kidney measures 8.7 cm longitudinally and demonstrates normal cortical thickness and echotexture without hydronephrosis or shadowing calculi. No perinephric fluid collections are identified. The urinary bladder is relatively decompressed without gross abnormality.    Impression: No hydronephrosis or other acute findings.          Past Medical History:   Diagnosis Date   ??? Anxiety    ??? Bipolar disorder (HCC)    ??? Calculus of kidney    ??? Depression    ??? Factor II deficiency (HCC)    ??? Factor V Leiden mutation (HCC)    ??? Hematuria, microscopic    ???  Infectious disease 02/2009     Hx MRSA / E.Coli bacteremia, MRSA wound infection 2/2 port   ??? Iron deficiency    ??? Migraines    ??? 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 Restless leg syndrome      Past Surgical History:   Procedure Laterality Date   ??? CYSTOSCOPY  ???    x 13   ??? HX APPENDECTOMY  ???   ??? HX CESAREAN SECTION     ??? HX ORTHOPAEDIC      left LE   ??? HX OTHER SURGICAL  02/13/2015    scar removal [facility] B lung at [facility]   ??? HX OVARIAN CYST REMOVAL      on R   ??? HX TONSILLECTOMY     ??? HX TUBAL LIGATION     ??? HX VASCULAR ACCESS  July, 2010     Power Port placed in [place], [place] removed   ??? HX VASCULAR ACCESS Right 2013    pt has current rt subclavian port - placed at [facility]   ??? LITHOTRIPSY  2006     Current Outpatient Prescriptions   Medication Sig Dispense Refill   ??? HYDROcodone-acetaminophen (NORCO) 5-325 mg per tablet Take 1 Tab by mouth every six (6) hours as needed. Max Daily Amount: 4 Tabs. 20 Tab 0   ??? nitrofurantoin (MACRODANTIN) 100 mg capsule Take 1 Cap by mouth two (2) times a day for 7 days. 14 Cap 0   ??? DULoxetine (CYMBALTA) 60 mg capsule Take 60 mg by mouth daily.     ??? clonazePAM (KLONOPIN) 1 mg tablet Take  by mouth three (3) times daily as needed.     ??? zolpidem (AMBIEN) 10 mg tablet Take  by mouth nightly as needed for Sleep.     ??? pantoprazole (PROTONIX) 40 mg tablet Take 40 mg by mouth two (2) times a day.       Allergies   Allergen Reactions   ??? Ativan [Lorazepam] Itching   ??? Codeine Rash   ??? Demerol [Meperidine] Itching     *     ??? Hydrocodone-Ibuprofen Unknown (comments)   ??? Iodinated Contrast Media - Oral And Iv Dye Rash     States also has swelling, and allergic to shellfish  Pt has had IV contrast several times after pre-medication   ??? Ketorolac Tromethamine Rash   ??? Pcn [Penicillins] Hives   ??? Pneumococcal Vaccine Swelling   ??? Pneumovax 23 [Pneumococcal 23-Val Ps Vaccine] Other (comments)     Local reaction   ??? Shellfish Containing Products Rash     Social History     Social History   ??? Marital status: DIVORCED     Spouse name: N/A   ??? Number of children: N/A   ??? Years of education: N/A     Occupational History   ??? Not on file.     Social History Main Topics   ??? Smoking status: Never Smoker   ??? Smokeless tobacco: Never Used   ??? Alcohol use Yes      Comment: rarely - only drinks 2 times per year   ??? Drug use: No      Comment: narcotic seeking per hospital hx.   ??? Sexual activity: Yes     Partners: Male     Birth control/ protection: Surgical     Other Topics Concern   ??? Not on file  Social History Narrative      Family History   Problem Relation Age of Onset   ??? Bleeding Prob Father      Factor V   ??? Ovarian Cancer Mother 56     passed away due to ca   ??? Bipolar Disorder Mother    ??? Kidney Disease Brother      kidney stones   ??? Cancer Maternal Grandmother      metastatic breast CA.       Review of Systems  Constitutional: Negative  Genitourinary: Positive for flank pain and history of urolithiasis.      Urinalysis  UA - Dipstick  Results for orders placed or performed in visit on 07/25/15   AMB POC URINALYSIS DIP STICK AUTO W/ MICRO (PGU)     Status: None   Result Value Ref Range Status    Glucose (UA POC) Negative Negative mg/dL Final    Bilirubin (UA POC) Negative Negative Final    Ketones (UA POC) Negative Negative Final    Specific gravity (UA POC) 1.025 1.001 - 1.035 Final    Blood (UA POC) Large Negative Final    pH (UA POC) 5.5 4.6 - 8.0 Final    Protein (UA POC) 30  Negative Final    Urobilinogen (POC) 0.2 mg/dL  Final    Nitrites (UA POC) Negative Negative Final    Leukocyte esterase (UA POC) Small Negative Final       UA - Micro  WBC - 2-5  RBC - 2-5  Bacteria - 2+  Epith - 0    PHYSICAL EXAM    Visit Vitals   ??? BP 100/71   ??? Pulse (!) 120   ??? Temp 98.3 ??F (36.8 ??C) (Tympanic)   ??? Ht 5\' 4"  (1.626 m)   ??? Wt 141 lb 3.2 oz (64 kg)   ??? LMP 07/28/2004   ??? BMI 24.24 kg/m2       General appearance - well appearing and in no distress  Mental status - alert, oriented to person, place, and time  Neck - supple, no significant adenopathy  Chest/Lung-  Quiet, even and easy respiratory effort without use of accessory muscles  Abdomen - soft, nontender, nondistended, no masses or organomegaly  Back exam - full range of motion, no tenderness  Neurological - normal speech, no focal findings or movement disorder noted on gross visual exam  Musculoskeletal - normal gait and station  Skin - normal coloration and turgor, no rashes    KUB in office today reviewed by myself and shows no obvious stone.      Assessment and Plan     ICD-10-CM ICD-9-CM    1. Right flank pain R10.9 789.09 AMB POC URINALYSIS DIP STICK AUTO W/ MICRO (PGU)      AMB POC XRAY, ABDOMEN; SINGLE ANTEROP      URINE CULTURE,COMPREHENSIVE   2. Acute cystitis with hematuria N30.01 595.0    3. History of kidney stones Z87.442 V13.01          Urine appears infected. UC pending. Starting on macrodantin BID x 7 days while waiting of culture.     No obvious stone on CT. Pt with significant stone hx and right flank pain. We discussed repeat CT vs f/u in 2 weeks with OV/KUB. She previous to f/u in 2 weeks.     Watchful Waiting advised:  Advised to Increase Fluids and Strain Urine.  Alpha blocker as prescribed. Declines need for rx as she has  some at home. Will call back if she needs refill.    RTO in 14 days for OV/KUB or sooner if symptoms worsen or fever of 100.5 or greater occurs. May need CT at that point.    Provided with rx for Norco.      Beverely RisenAshley B Xara Paulding, FNP    Dr. Katrinka BlazingSmith is supervising physician today and he approves plan of care.    Follow-up Disposition:  Return in about 2 weeks (around 08/08/2015) for OV and KUB.

## 2015-07-28 LAB — URINE CULTURE,COMPREHENSIVE: Urine Culture,Comprehensive: NO GROWTH

## 2015-07-29 NOTE — Progress Notes (Signed)
Urine culture negative. Stop ATB. F/U as scheduled. Call sooner if sx worsen.

## 2015-07-31 NOTE — Progress Notes (Signed)
Pt notified of results.

## 2015-08-07 ENCOUNTER — Emergency Department: Payer: MEDICARE | Primary: Student in an Organized Health Care Education/Training Program

## 2015-08-07 ENCOUNTER — Inpatient Hospital Stay
Admit: 2015-08-07 | Discharge: 2015-08-08 | Disposition: A | Discharge status: Home / Self Care | Payer: MEDICARE | Attending: Emergency Medicine

## 2015-08-07 ENCOUNTER — Emergency Department: Admit: 2015-08-07 | Payer: MEDICARE | Primary: Student in an Organized Health Care Education/Training Program

## 2015-08-07 DIAGNOSIS — R0789 Other chest pain: Secondary | ICD-10-CM

## 2015-08-07 NOTE — ED Notes (Signed)
I have reviewed discharge instructions with the patient.  The patient verbalized understanding. 1 prescription given to patient.  Port de-acessed prior to discharge. Pt ambulatory upon discharge and medicaid van called.

## 2015-08-07 NOTE — ED Triage Notes (Signed)
"  I started having pain in my chest and my lung.  It started around 3 this morning"  Patient denies SOB

## 2015-08-07 NOTE — ED Notes (Signed)
Pt requesting medication for pain and nausea.  Dr. Verlon AuWetenhall aware.  No new orders at this time.

## 2015-08-07 NOTE — ED Provider Notes (Signed)
HPI Comments: Patient is here with chest pain.   Patient with a long and extensive history that is well documented in [facility]  States that she was seen by pulmonary medicine today.  Comes in with stating that that she has some chest pain  That is worse to breathing.  She has history of embolic events in the past.  She had this history when she was seen by pulmonary and was told to go back on Lovenox.  She states that she has been off it for approximately 4 days to allow her to have oral surgery on this Thursday at 3 PM.  She is supposed to be on Lovenox 60 mg subcutaneous twice daily.  [facility] July [facility] for follow-up. Has IVC filter    Of note she has had a history provided on April 7, April 23, May 11, June 18in addition to today of having gone off of her Lovenox for 4-5 days for a dental procedure before coming in with chest pain complaint.    Patient is a 50 y.o. female presenting with chest pain.   Chest Pain (Angina)    Pertinent negatives include no fever.        Past Medical History:   Diagnosis Date   ??? Anxiety    ??? Bipolar disorder (HCC)    ??? Calculus of kidney    ??? Depression    ??? Factor II deficiency (HCC)    ??? Factor V Leiden mutation (HCC)    ??? Hematuria, microscopic    ??? Infectious disease 02/2009     Hx MRSA / E.Coli bacteremia, MRSA wound infection 2/2 port   ??? Iron deficiency    ??? Migraines    ??? 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 Restless leg syndrome        Past Surgical History:   Procedure Laterality Date   ??? CYSTOSCOPY  ???    x 13   ??? HX APPENDECTOMY  ???   ??? HX CESAREAN SECTION     ??? HX ORTHOPAEDIC      left LE   ??? HX OTHER SURGICAL  02/13/2015    [facility]   ??? HX OVARIAN CYST REMOVAL      on R   ??? HX TONSILLECTOMY     ??? HX TUBAL LIGATION     ??? HX VASCULAR ACCESS  July, 2010    [place]    ??? HX VASCULAR ACCESS Right 2013    pt has current rt [facility]clavian port [facility]   ??? LITHOTRIPSY  2006         Family History:   Problem Relation Age of Onset   ??? Bleeding Prob Father      Factor V   ??? Ovarian Cancer Mother [id number]     passed away due to ca   ??? Bipolar Disorder Mother    ??? Kidney Disease Brother      kidney stones   ??? Cancer Maternal Grandmother      metastatic breast CA.       Social History     Social History   ??? Marital status: DIVORCED     Spouse name: N/A   ??? Number of children: N/A   ??? Years of education: N/A  Occupational History   ??? Not on file.     Social History Main Topics   ??? Smoking status: Never Smoker   ??? Smokeless tobacco: Never Used   ??? Alcohol use Yes      Comment: rarely - only drinks 2 times per year   ??? Drug use: No      Comment: narcotic seeking per hospital hx.   ??? Sexual activity: Yes     Partners: Male     Birth control/ protection: Surgical     Other Topics Concern   ??? Not on file     Social History Narrative         ALLERGIES: Ativan [lorazepam]; Codeine; Demerol [meperidine]; Hydrocodone-ibuprofen; Iodinated contrast- oral and iv dye; Ketorolac tromethamine; Pcn [penicillins]; Pneumococcal vaccine; Pneumovax 23 [pneumococcal 23-val ps vaccine]; and Shellfish containing products    Review of Systems   Constitutional: Negative for chills and fever.   Respiratory: Negative.    Cardiovascular: Positive for chest pain.   Genitourinary: Negative.    Hematological: Negative.    All other systems reviewed and are negative.      Vitals:    08/07/15 1925 08/07/15 2125 08/07/15 2338   BP: 140/59 140/74 129/62   Pulse: 80  89   Resp: 16     Temp: 98.6 ??F (37 ??C)  98.7 ??F (37.1 ??C)   SpO2: 98% 95% 95%   Weight: 65.3 kg (144 lb)     Height: 5\' 4"  (1.626 m)              Physical Exam   Constitutional: She appears well-developed and well-nourished. No distress.   Smiling, no distress   HENT:   Head: Atraumatic.   Mouth/Throat: Oropharynx is clear and moist.    Eyes: No scleral icterus.   Neck: Neck supple.   Cardiovascular: Normal rate and intact distal pulses.    Pulmonary/Chest: Effort normal. No respiratory distress. She exhibits no tenderness.   Abdominal: Soft. There is no tenderness. There is no rebound.   Musculoskeletal: Normal range of motion. She exhibits no edema, tenderness or deformity.   Neurological: She is alert.   Skin: Skin is warm and dry. She is not diaphoretic.   Psychiatric: Her behavior is normal. Thought content normal.   Nursing note and vitals reviewed.       MDM  Number of Diagnoses or Management Options  Medical non-compliance:   Pleuritic chest pain:   Diagnosis management comments: Recurring episode of chest discomfort  With some combination of Lovenox by patient history.  Patient had made her pulmonologist aware of the same today by her report and he had told her to resume taking her Lovenox.maintain oxygen sats on room air in the upper 90s.  Heart rate remains in the 75 to low 80 range.  Discussion of the patient's presenting history and prior history with hospitalist  In the ER directly.  Do not feel as though patient has benefit from admission but wishes patient to resume Lovenox.  No point is patient appear in any extremities       Amount and/or Complexity of Data Reviewed  Clinical lab tests: ordered and reviewed  Tests in the radiology section of CPT??: ordered  Decide to obtain previous medical records or to obtain history from someone other than the patient: yes  Discuss the patient with other providers: yes (hospitalist)    Risk of Complications, Morbidity, and/or Mortality  Presenting problems: moderate  Diagnostic procedures: low  Management options: moderate  Patient Progress  Patient progress: stable    ED Course       Procedures

## 2015-08-08 LAB — EKG 12-LEAD
Atrial Rate: 79 {beats}/min
Diagnosis: NORMAL
P Axis: 82 degrees
P-R Interval: 154 ms
Q-T Interval: 398 ms
QRS Duration: 90 ms
QTc Calculation (Bazett): 456 ms
R Axis: 96 degrees
T Axis: 78 degrees
Ventricular Rate: 79 {beats}/min

## 2015-08-08 LAB — CBC WITH AUTOMATED DIFF
ABS. BASOPHILS: 0 10*3/uL (ref 0.0–0.2)
ABS. EOSINOPHILS: 0.3 10*3/uL (ref 0.0–0.8)
ABS. IMM. GRANS.: 0 10*3/uL (ref 0.0–0.5)
ABS. LYMPHOCYTES: 1.3 10*3/uL (ref 0.5–4.6)
ABS. MONOCYTES: 0.5 10*3/uL (ref 0.1–1.3)
ABS. NEUTROPHILS: 3.4 10*3/uL (ref 1.7–8.2)
BASOPHILS: 1 % (ref 0.0–2.0)
EOSINOPHILS: 5 % (ref 0.5–7.8)
HCT: 32.8 % — ABNORMAL LOW (ref 35.8–46.3)
HGB: 11 g/dL — ABNORMAL LOW (ref 11.7–15.4)
IMMATURE GRANULOCYTES: 0 % (ref 0.0–5.0)
LYMPHOCYTES: 25 % (ref 13–44)
MCH: 28.9 PG (ref 26.1–32.9)
MCHC: 33.5 g/dL (ref 31.4–35.0)
MCV: 86.1 FL (ref 79.6–97.8)
MONOCYTES: 8 % (ref 4.0–12.0)
MPV: 10 FL — ABNORMAL LOW (ref 10.8–14.1)
NEUTROPHILS: 61 % (ref 43–78)
PLATELET: 258 10*3/uL (ref 150–450)
RBC: 3.81 M/uL — ABNORMAL LOW (ref 4.05–5.25)
RDW: 19.4 % — ABNORMAL HIGH (ref 11.9–14.6)
WBC: 5.5 10*3/uL (ref 4.3–11.1)

## 2015-08-08 LAB — METABOLIC PANEL, BASIC
Anion gap: 10 mmol/L (ref 7–16)
BUN: 14 MG/DL (ref 6–23)
CO2: 26 mmol/L (ref 21–32)
Calcium: 9.2 MG/DL (ref 8.3–10.4)
Chloride: 107 mmol/L (ref 98–107)
Creatinine: 1.07 MG/DL — ABNORMAL HIGH (ref 0.6–1.0)
GFR est AA: >60 mL/min/{1.73_m2} (ref >60)
GFR est non-AA: 58 mL/min/{1.73_m2} — ABNORMAL LOW (ref >60)
Glucose: 89 mg/dL (ref 65–100)
Potassium: 4 mmol/L (ref 3.5–5.1)
Sodium: 143 mmol/L (ref 136–145)

## 2015-08-08 LAB — EKG, 12 LEAD, INITIAL
Atrial Rate: 79 {beats}/min
Calculated P Axis: 82 degrees
Calculated R Axis: 96 degrees
Calculated T Axis: 78 degrees
Diagnosis: NORMAL
P-R Interval: 154 ms
Q-T Interval: 398 ms
QRS Duration: 90 ms
QTC Calculation (Bezet): 456 ms
Ventricular Rate: 79 {beats}/min

## 2015-08-08 LAB — POC TROPONIN: Troponin-I (POC): 0 ng/ml (ref 0.0–0.08)

## 2015-08-08 MED ORDER — HEPARIN, PORCINE (PF) 100 UNIT/ML IV SYRINGE
100 unit/mL | INTRAVENOUS | Status: DC | PRN
Start: 2015-08-08 — End: 2015-08-08
  Administered 2015-08-08: 04:00:00

## 2015-08-08 MED ORDER — ENOXAPARIN 80 MG/0.8 ML SUB-Q SYRINGE
80 mg/0.8 mL | SUBCUTANEOUS | Status: AC
Start: 2015-08-08 — End: 2015-08-07
  Administered 2015-08-08: 03:00:00 via SUBCUTANEOUS

## 2015-08-08 MED ORDER — DIPHENHYDRAMINE HCL 50 MG/ML IJ SOLN
50 mg/mL | INTRAMUSCULAR | Status: AC
Start: 2015-08-08 — End: 2015-08-07
  Administered 2015-08-08: 03:00:00 via INTRAVENOUS

## 2015-08-08 MED ORDER — ENOXAPARIN 80 MG/0.8 ML SUB-Q SYRINGE
80 mg/0.8 mL | SUBCUTANEOUS | Status: DC
Start: 2015-08-08 — End: 2015-08-07

## 2015-08-08 MED ORDER — METHYLPREDNISOLONE (PF) 125 MG/2 ML IJ SOLR
125 mg/2 mL | Freq: Once | INTRAMUSCULAR | Status: AC
Start: 2015-08-08 — End: 2015-08-07
  Administered 2015-08-08: 03:00:00 via INTRAVENOUS

## 2015-08-08 MED ORDER — ENOXAPARIN 60 MG/0.6 ML SUB-Q SYRINGE
60 mg/0.6 mL | INJECTION | Freq: Two times a day (BID) | SUBCUTANEOUS | 1 refills | Status: AC
Start: 2015-08-08 — End: 2015-08-22

## 2015-08-08 MED FILL — DIPHENHYDRAMINE HCL 50 MG/ML IJ SOLN: 50 mg/mL | INTRAMUSCULAR | Qty: 1

## 2015-08-08 MED FILL — SOLU-MEDROL (PF) 125 MG/2 ML SOLUTION FOR INJECTION: 125 mg/2 mL | INTRAMUSCULAR | Qty: 2

## 2015-08-08 MED FILL — LOVENOX 80 MG/0.8 ML SUBCUTANEOUS SYRINGE: 80 mg/0.8 mL | SUBCUTANEOUS | Qty: 0.8

## 2015-08-08 MED FILL — MONOJECT PREFILL ADVANCED (PF) 100 UNIT/ML INTRAVENOUS SYRINGE: 100 unit/mL | INTRAVENOUS | Qty: 3

## 2015-08-09 ENCOUNTER — Ambulatory Visit
Admit: 2015-08-09 | Discharge: 2015-08-09 | Payer: MEDICARE | Attending: Registered Nurse | Primary: Student in an Organized Health Care Education/Training Program

## 2015-08-09 DIAGNOSIS — R310 Gross hematuria: Secondary | ICD-10-CM

## 2015-08-09 LAB — AMB POC URINALYSIS DIP STICK AUTO W/ MICRO (PGU)
Bilirubin (UA POC): NEGATIVE
Glucose (UA POC): NEGATIVE mg/dL
Ketones (UA POC): NEGATIVE
Nitrites (UA POC): NEGATIVE
Protein (UA POC): NEGATIVE
Specific gravity (UA POC): 1.025 (ref 1.001–1.035)
Urobilinogen (POC): 0.2
pH (UA POC): 5.5 (ref 4.6–8.0)

## 2015-08-09 NOTE — Progress Notes (Addendum)
[facility]  [address]  [place], [place] [id number]  [phone]    Krista Lopez  DOB: 08-Jan-1966    Chief Complaint   Patient presents with   ??? Flank Pain     right          HPI     Krista Lopez is a 50 y.o. female presents today for stone follow up.     She is a patient of Dr. Archie PattenMonroe. Known hx of stones.     Visiting friends in [place], [place] and had new onset of difficulty breathing. Admitted to hospital 07-12-15 for newly dx PE. She has known hx of clotting disorders. Subsequently developed right flank. Had CT that confirmed 2 mm right ureteral stone.  There is no documentation available for review of this visit. Patient did not obtain her medical records or CT from this visit as requested by our office.     She then missed her follow up appointment with this office 2 days ago 07-23-15. She thought she had passed the stone and was having no more pain.     She then presented to [facility] ER yesterday 07-24-15 for right sided flank pain. She was provided tramadol and zofran and advised to follow up with this office. No imaging was obtained. It is noted in providers note that they were also unable to obtain records from hospital in [place] where patient was previously seen. No UC or micro performed.      07-25-15 She continues to report right sided flank pain. No dysuria, fever, chills, n/v, gross hematuria, or difficulty urinating. Reports mild relief of pain with Ultram. She is out of this prescription. Has not used Zofran. Was not started on Flomax.      Recent imaging per EMR:     [facility]  FINDINGS:      ABDOMEN:  Mild dependent subsegmental atelectasis bilateral lung bases. Normal-appearing  liver, gallbladder, spleen, pancreas, bilateral adrenal glands and left kidney.   Punctate nonobstructing calculi in the right renal medullary level measuring up  to 2 mm in diameter unchanged. Infrarenal IVC filter appears to remain in   anatomic position. Normal caliber abdominal aorta. No evidence of significant  lymphadenopathy. Normal-appearing small bowel. No evidence of intraperitoneal  free air or free fluid.      PELVIS:  Normal-appearing urinary bladder. Normal-appearing uterus and bilateral adnexa.  Normal-appearing colon. No evidence of pelvic free fluid. No evidence of  significant inguinal or pelvic sidewall lymphadenopathy. Visualized osseous  structures unremarkable.      IMPRESSION  IMPRESSION:       1. No acute pathology identified.  2. Other chronic findings as above.      RUS 06/24/15 at [facility]     Findings: The right kidney measures 8.4 cm longitudinally and demonstrates normal cortical thickness and echotexture without hydronephrosis or shadowing calculi. The left kidney measures 8.7 cm longitudinally and demonstrates normal cortical thickness and echotexture without hydronephrosis or shadowing calculi. No perinephric fluid collections are identified. The urinary bladder is relatively decompressed without gross abnormality.     Impression: No hydronephrosis or other acute findings.      KUB w/o obvious stone. UC negative. She was to return for recheck.    08/09/15 She has since [facility] 07/31/15 for flank pain.    CT abd/pelvis wo contrast IMPRESSION:  2 small nonobstructing right renal calculi are present.????There is no obstructive urolithiasis.????No hydronephrosis.????Otherwise no  acute abnormalities appreciated by noncontrast CT.    UC 07/29/15 Negative.       Continues to report right lower quad pain that worsens at night. Intermittent gross hematuria noted by patient. She denies passing stones following recent ER visit. Denies dysuria, fever, chills, n/v, or difficulty urinating.      Past Medical History:   Diagnosis Date   ??? Anxiety    ??? Bipolar disorder (HCC)    ??? Calculus of kidney    ??? Depression    ??? Factor II deficiency (HCC)    ??? Factor V Leiden mutation (HCC)    ??? Hematuria, microscopic     ??? Infectious disease 02/2009     Hx MRSA / E.Coli bacteremia, MRSA wound infection 2/2 port   ??? Iron deficiency    ??? Migraines    ??? 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 Restless leg syndrome      Past Surgical History:   Procedure Laterality Date   ??? CYSTOSCOPY  ???    x 13   ??? HX APPENDECTOMY  ???   ??? HX CESAREAN SECTION     ??? HX ORTHOPAEDIC      left LE   ??? HX OTHER SURGICAL  02/13/2015    scar removal [facility] B lung [facility]   ??? HX OVARIAN CYST REMOVAL      on R   ??? HX TONSILLECTOMY     ??? HX TUBAL LIGATION     ??? HX VASCULAR ACCESS  July, 2010    Power Port placed in [place], [place] removed   ??? HX VASCULAR ACCESS Right 2013    pt has current rt subclavian port - placed at [facility]   ??? LITHOTRIPSY  2006     Current Outpatient Prescriptions   Medication Sig Dispense Refill   ??? enoxaparin (LOVENOX) 60 mg/0.6 mL injection 60 mg by SubCUTAneous route every twelve (12) hours for 15 days. 1 Syringe 1   ??? HYDROcodone-acetaminophen (NORCO) 5-325 mg per tablet Take 1 Tab by mouth every six (6) hours as needed. Max Daily Amount: 4 Tabs. 20 Tab 0   ??? DULoxetine (CYMBALTA) 60 mg capsule Take 60 mg by mouth daily.     ??? clonazePAM (KLONOPIN) 1 mg tablet Take  by mouth three (3) times daily as needed.     ??? zolpidem (AMBIEN) 10 mg tablet Take  by mouth nightly as needed for Sleep.     ??? pantoprazole (PROTONIX) 40 mg tablet Take 40 mg by mouth two (2) times a day.       Allergies   Allergen Reactions   ??? Ativan [Lorazepam] Itching   ??? Codeine Rash   ??? Demerol [Meperidine] Itching     *     ??? Hydrocodone-Ibuprofen Unknown (comments)   ??? Iodinated Contrast- Oral And Iv Dye Rash     States also has swelling, and allergic to shellfish  Pt has had IV contrast several times after pre-medication   ??? Ketorolac Tromethamine Rash   ??? Pcn [Penicillins] Hives   ??? Pneumococcal Vaccine Swelling   ??? Pneumovax 23 [Pneumococcal 23-Val Ps Vaccine] Other (comments)     Local reaction    ??? Shellfish Containing Products Rash     Social History     Social History   ??? Marital status: DIVORCED     Spouse name: N/A   ??? Number of children: N/A   ???  Years of education: N/A     Occupational History   ??? Not on file.     Social History Main Topics   ??? Smoking status: Never Smoker   ??? Smokeless tobacco: Never Used   ??? Alcohol use Yes      Comment: rarely - only drinks 2 times per year   ??? Drug use: No      Comment: narcotic seeking per hospital hx.   ??? Sexual activity: Yes     Partners: Male     Birth control/ protection: Surgical     Other Topics Concern   ??? Not on file     Social History Narrative     Family History   Problem Relation Age of Onset   ??? Bleeding Prob Father      Factor V   ??? Ovarian Cancer Mother 66     passed away due to ca   ??? Bipolar Disorder Mother    ??? Kidney Disease Brother      kidney stones   ??? Cancer Maternal Grandmother      metastatic breast CA.       Review of Systems  Constitutional: Negative  Genitourinary: Positive for flank pain and history of urolithiasis.      Urinalysis  UA - Dipstick  Results for orders placed or performed in visit on 08/09/15   AMB POC URINALYSIS DIP STICK AUTO W/ MICRO (PGU)     Status: None   Result Value Ref Range Status    Glucose (UA POC) Negative Negative mg/dL Final    Bilirubin (UA POC) Negative Negative Final    Ketones (UA POC) Negative Negative Final    Specific gravity (UA POC) 1.025 1.001 - 1.035 Final    Blood (UA POC) Large Negative Final    pH (UA POC) 5.5 4.6 - 8.0 Final    Protein (UA POC) Negative Negative Final    Urobilinogen (POC) 0.2 mg/dL  Final    Nitrites (UA POC) Negative Negative Final    Leukocyte esterase (UA POC) Small Negative Final       UA - Micro  WBC - 0  RBC - 3-6  Bacteria - 0  Epith - 0    PHYSICAL EXAM    Visit Vitals   ??? BP 95/56   ??? Pulse 100   ??? Temp 96.7 ??F (35.9 ??C) (Tympanic)   ??? Ht 5\' 4"  (1.626 m)   ??? Wt 145 lb (65.8 kg)   ??? LMP 07/28/2004   ??? BMI 24.89 kg/m2        General appearance - well appearing and in no distress  Mental status - alert, oriented to person, place, and time  Neck - supple, no significant adenopathy  Chest/Lung-  Quiet, even and easy respiratory effort without use of accessory muscles  Abdomen - soft, nontender, nondistended, no masses or organomegaly  Back exam - full range of motion, no tenderness  Neurological - normal speech, no focal findings or movement disorder noted on gross visual exam  Musculoskeletal - normal gait and station  Skin - normal coloration and turgor, no rashes    KUB in office today reviewed by myself and shows no change from previous KUB.      Assessment and Plan    ICD-10-CM ICD-9-CM    1. Gross hematuria R31.0 599.71    2. Flank pain R10.9 789.09 AMB POC URINALYSIS DIP STICK AUTO W/ MICRO (PGU)      AMB POC XRAY, ABDOMEN;  SINGLE ANTEROP       Due to reports of gross hematuria, RBC in urine today w/o infection, and CT w/o urological cause for her RLQ pain, we discussed cystoscopy in the office. She is agreeable to this.      Patient requested pain medication today. However, She was provided prescription for Norco during OV in this office 2 weeks ago. Declined this request.      Beverely RisenAshley B Umberto Pavek, FNP    Dr. Davene CostainBullock is supervising physician today and he approves plan of care.    Follow-up Disposition:  Return for for next available cystoscopy with Dr. Archie PattenMonroe.        Addendum 1345 08/09/15    Patient information searched on [facility] website.    She has numerous prescriptions for pain medication and 24 prescribers listed with 6 pharmacies.    Beverely RisenAshley B Phallon Haydu, FNP

## 2015-08-16 NOTE — Telephone Encounter (Signed)
Krista CountsBob you may want to call this patient.  Krista Lopez saw her recently and did a search on the [facility] site.  The patient apparently has received narcotics from many many physicians.  I think she is definitely got secondary gain although she has had stones.  She's had several recent studies that show no hydronephrosis and no ureteral stones.   In any event the ER called me about her today and I told them that we were not going to admit her and I did not think she need any urologic intervention.

## 2015-08-16 NOTE — Telephone Encounter (Signed)
ER called and asked that we see the pt /dh

## 2015-09-17 ENCOUNTER — Encounter: Payer: MEDICARE | Attending: Urology | Primary: Student in an Organized Health Care Education/Training Program

## 2015-11-08 ENCOUNTER — Inpatient Hospital Stay
Admit: 2015-11-08 | Discharge: 2015-11-08 | Disposition: A | Discharge status: Home / Self Care | Payer: MEDICARE | Attending: Emergency Medicine

## 2015-11-08 ENCOUNTER — Emergency Department: Admit: 2015-11-08 | Payer: MEDICARE | Primary: Student in an Organized Health Care Education/Training Program

## 2015-11-08 DIAGNOSIS — G8929 Other chronic pain: Secondary | ICD-10-CM

## 2015-11-08 MED ORDER — NAPROXEN 500 MG TAB
500 mg | ORAL_TABLET | Freq: Two times a day (BID) | ORAL | 0 refills | Status: AC
Start: 2015-11-08 — End: 2015-11-18

## 2015-11-08 MED ORDER — PHENAZOPYRIDINE 200 MG TAB
200 mg | ORAL_TABLET | Freq: Three times a day (TID) | ORAL | 0 refills | Status: AC
Start: 2015-11-08 — End: 2015-11-10

## 2015-11-08 NOTE — ED Notes (Signed)
I have reviewed discharge instructions with the patient.  The patient verbalized understanding.    Patient left ED via Discharge Method: ambulatory to Home with self.    Opportunity for questions and clarification provided.       Patient given 0 scripts. 2 sent to Patient's pharmacy

## 2015-11-08 NOTE — ED Provider Notes (Signed)
HPI Comments: 50 year old female complaining of right flank pain.  Patient states that she has kidney stones and has feeling that she has a kidney stone.    Patient is a 50 y.o. female presenting with flank pain. [name]   Flank Pain    This is a chronic problem. The current episode started more than 1 week ago. The problem has not changed since onset.The problem occurs constantly. The pain is associated with no known injury. The quality of the pain is described as stabbing. The pain does not radiate. The pain is at a severity of 9/10. The pain is moderate.        Past Medical History:   Diagnosis Date   ??? Anxiety    ??? Bipolar disorder (HCC)    ??? Calculus of kidney    ??? Depression    ??? Factor II deficiency (HCC)    ??? Factor V Leiden mutation (HCC)    ??? Hematuria, microscopic    ??? Infectious disease 02/2009     Hx MRSA / E.Coli bacteremia, MRSA wound infection 2/2 port   ??? Iron deficiency    ??? Migraines    ??? 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 Restless leg syndrome        Past Surgical History:   Procedure Laterality Date   ??? CYSTOSCOPY  ???    x 13   ??? HX APPENDECTOMY  ???   ??? HX CESAREAN SECTION     ??? HX ORTHOPAEDIC      left LE   ??? HX OTHER SURGICAL  02/13/2015    [facility]   ??? HX OVARIAN CYST REMOVAL      on R   ??? HX TONSILLECTOMY     ??? HX TUBAL LIGATION     ??? HX VASCULAR ACCESS  July, 2010    [place]   ??? HX VASCULAR ACCESS Right 2013    pt has current rt [facility]clavian port [facility]   ??? LITHOTRIPSY  2006         Family History:   Problem Relation Age of Onset   ??? Bleeding Prob Father      Factor V   ??? Ovarian Cancer Mother 126     passed away due to ca   ??? Bipolar Disorder Mother    ??? Kidney Disease Brother      kidney stones   ??? Cancer Maternal Grandmother      metastatic breast CA.       Social History     Social History    ??? Marital status: DIVORCED     Spouse name: N/A   ??? Number of children: N/A   ??? Years of education: N/A     Occupational History   ??? Not on file.     Social History Main Topics   ??? Smoking status: Never Smoker   ??? Smokeless tobacco: Never Used   ??? Alcohol use Yes      Comment: rarely - only drinks 2 times per year   ??? Drug use: No      Comment: narcotic seeking per hospital hx.   ??? Sexual activity: Yes     Partners: Male     Birth control/ protection: Surgical     Other Topics Concern   ??? Not on file  Social History Narrative         ALLERGIES: Ativan [lorazepam]; Codeine; Demerol [meperidine]; Hydrocodone-ibuprofen; Iodinated contrast- oral and iv dye; Ketorolac tromethamine; Pcn [penicillins]; Pneumococcal vaccine; Pneumovax 23 [pneumococcal 23-val ps vaccine]; and Shellfish containing products    Review of Systems   Constitutional: Negative.  Negative for activity change.   HENT: Negative.    Eyes: Negative.    Respiratory: Negative.    Cardiovascular: Negative.    Gastrointestinal: Negative.    Genitourinary: Positive for flank pain.   Skin: Negative.    Neurological: Negative.    Psychiatric/Behavioral: Negative.    All other systems reviewed and are negative.      Vitals:    11/08/15 1213   BP: 112/80   Pulse: 96   Resp: 16   Temp: 98 ??F (36.7 ??C)   SpO2: 97%   Weight: 69.9 kg (154 lb)   Height: 5\' 4"  (1.626 m)            Physical Exam   Constitutional: She is oriented to person, place, and time. She appears well-developed and well-nourished. No distress.   HENT:   Head: Normocephalic and atraumatic.   Right Ear: External ear normal.   Left Ear: External ear normal.   Nose: Nose normal.   Mouth/Throat: Oropharynx is clear and moist. No oropharyngeal exudate.   Eyes: Conjunctivae and EOM are normal. Pupils are equal, round, and reactive to light. Right eye exhibits no discharge. Left eye exhibits no discharge. No scleral icterus.   Neck: Normal range of motion. Neck supple. No JVD present. No tracheal  deviation present.   Cardiovascular: Normal rate, regular rhythm and intact distal pulses.    Pulmonary/Chest: Effort normal and breath sounds normal. No stridor. No respiratory distress. She has no wheezes. She exhibits no tenderness.   Abdominal: Soft. Bowel sounds are normal. She exhibits no distension and no mass. There is no tenderness.   Musculoskeletal: Normal range of motion. She exhibits no edema or tenderness.   Neurological: She is alert and oriented to person, place, and time. No cranial nerve deficit.   Skin: Skin is warm and dry. No rash noted. She is not diaphoretic. No erythema. No pallor.   Psychiatric: She has a normal mood and affect. Her behavior is normal. Thought content normal.   Nursing note and vitals reviewed.       MDM  Number of Diagnoses or Management Options  Other chronic pain:   Diagnosis management comments: CT negative for any abnormalities no stones or obstruction.  Long discussion with the patient concerning her visits to the ED and chronic use of narcotics.  Patient was opened and stated that she does have 4 prescriptions this month for pain medicines initially she did not however.  Patient was advised we cannot supply her with any further controlled substances her exam is normal her CT is negative.  She is asking for a nonsteroidal inflammatories and Pyridium will provider this has treatment of her pain of unknown origin.    ED Course       Procedures

## 2015-11-08 NOTE — ED Triage Notes (Signed)
Pt states she has two kidney stones in right kidney and states pain in right flank since yesterday but worse.

## 2015-11-12 ENCOUNTER — Encounter: Payer: MEDICARE | Attending: Registered Nurse | Primary: Student in an Organized Health Care Education/Training Program

## 2015-11-13 NOTE — Telephone Encounter (Signed)
Patient was a no show for office visit

## 2015-11-13 NOTE — Telephone Encounter (Signed)
Noted.

## 2016-05-14 DIAGNOSIS — I2699 Other pulmonary embolism without acute cor pulmonale: Secondary | ICD-10-CM

## 2016-05-14 DIAGNOSIS — D682 Hereditary deficiency of other clotting factors: Secondary | ICD-10-CM

## 2016-05-14 DIAGNOSIS — G43909 Migraine, unspecified, not intractable, without status migrainosus: Secondary | ICD-10-CM

## 2016-05-14 DIAGNOSIS — F329 Major depressive disorder, single episode, unspecified: Secondary | ICD-10-CM

## 2016-05-14 DIAGNOSIS — D6851 Activated protein C resistance: Secondary | ICD-10-CM

## 2016-05-15 DIAGNOSIS — I272 Pulmonary hypertension, unspecified: Secondary | ICD-10-CM

## 2016-05-15 DIAGNOSIS — I2699 Other pulmonary embolism without acute cor pulmonale: Secondary | ICD-10-CM

## 2016-06-02 DIAGNOSIS — R0602 Shortness of breath: Secondary | ICD-10-CM

## 2016-06-02 DIAGNOSIS — D6851 Activated protein C resistance: Secondary | ICD-10-CM

## 2016-06-02 DIAGNOSIS — F418 Other specified anxiety disorders: Secondary | ICD-10-CM

## 2016-06-02 DIAGNOSIS — R1011 Right upper quadrant pain: Secondary | ICD-10-CM

## 2016-06-02 DIAGNOSIS — I2699 Other pulmonary embolism without acute cor pulmonale: Secondary | ICD-10-CM

## 2016-06-02 DIAGNOSIS — R828 Abnormal findings on cytological and histological examination of urine: Secondary | ICD-10-CM

## 2016-06-02 DIAGNOSIS — Z298 Encounter for other specified prophylactic measures: Secondary | ICD-10-CM

## 2016-06-02 DIAGNOSIS — I272 Pulmonary hypertension, unspecified: Secondary | ICD-10-CM

## 2016-06-02 DIAGNOSIS — Z01818 Encounter for other preprocedural examination: Secondary | ICD-10-CM

## 2016-06-02 DIAGNOSIS — K219 Gastro-esophageal reflux disease without esophagitis: Secondary | ICD-10-CM

## 2016-06-04 DIAGNOSIS — R079 Chest pain, unspecified: Secondary | ICD-10-CM

## 2016-06-04 DIAGNOSIS — K811 Chronic cholecystitis: Secondary | ICD-10-CM

## 2016-06-04 DIAGNOSIS — I2724 Chronic thromboembolic pulmonary hypertension: Secondary | ICD-10-CM

## 2016-07-12 ENCOUNTER — Emergency Department (HOSPITAL_COMMUNITY): Payer: Self-pay | Admitting: Emergency Medicine

## 2016-08-29 DIAGNOSIS — R0602 Shortness of breath: Secondary | ICD-10-CM

## 2016-09-08 DIAGNOSIS — R1011 Right upper quadrant pain: Secondary | ICD-10-CM

## 2017-02-05 DIAGNOSIS — R0600 Dyspnea, unspecified: Secondary | ICD-10-CM

## 2017-02-05 DIAGNOSIS — R079 Chest pain, unspecified: Secondary | ICD-10-CM

## 2017-02-06 DIAGNOSIS — Z9981 Dependence on supplemental oxygen: Secondary | ICD-10-CM

## 2017-02-06 DIAGNOSIS — I2699 Other pulmonary embolism without acute cor pulmonale: Secondary | ICD-10-CM

## 2017-02-06 DIAGNOSIS — J9611 Chronic respiratory failure with hypoxia: Secondary | ICD-10-CM

## 2017-02-06 DIAGNOSIS — Z7901 Long term (current) use of anticoagulants: Secondary | ICD-10-CM

## 2017-02-07 DIAGNOSIS — N179 Acute kidney failure, unspecified: Secondary | ICD-10-CM

## 2017-02-07 DIAGNOSIS — I2724 Chronic thromboembolic pulmonary hypertension: Secondary | ICD-10-CM

## 2017-02-07 DIAGNOSIS — D682 Hereditary deficiency of other clotting factors: Secondary | ICD-10-CM

## 2017-02-08 ENCOUNTER — Inpatient Hospital Stay (HOSPITAL_COMMUNITY): Payer: Medicare Other | Admitting: Cardiovascular Disease

## 2017-02-08 ENCOUNTER — Inpatient Hospital Stay
Admission: EM | Admit: 2017-02-08 | Discharge: 2017-02-12 | DRG: 287 | Disposition: A | Discharge status: Home / Self Care | Payer: Medicare Other | Source: Other Acute Inpatient Hospital | Attending: Cardiovascular Disease | Admitting: Cardiovascular Disease

## 2017-02-08 DIAGNOSIS — Z95818 Presence of other cardiac implants and grafts: Secondary | ICD-10-CM

## 2017-02-08 DIAGNOSIS — Z9114 Patient's other noncompliance with medication regimen: Secondary | ICD-10-CM

## 2017-02-08 DIAGNOSIS — Z9181 History of falling: Secondary | ICD-10-CM

## 2017-02-08 DIAGNOSIS — Z880 Allergy status to penicillin: Secondary | ICD-10-CM

## 2017-02-08 DIAGNOSIS — D6851 Activated protein C resistance: Secondary | ICD-10-CM | POA: Diagnosis present

## 2017-02-08 DIAGNOSIS — Z765 Malingerer [conscious simulation]: Secondary | ICD-10-CM

## 2017-02-08 DIAGNOSIS — Z7901 Long term (current) use of anticoagulants: Secondary | ICD-10-CM

## 2017-02-08 DIAGNOSIS — D682 Hereditary deficiency of other clotting factors: Secondary | ICD-10-CM | POA: Diagnosis present

## 2017-02-08 DIAGNOSIS — R0600 Dyspnea, unspecified: Secondary | ICD-10-CM

## 2017-02-08 DIAGNOSIS — Z8781 Personal history of (healed) traumatic fracture: Secondary | ICD-10-CM

## 2017-02-08 DIAGNOSIS — F329 Major depressive disorder, single episode, unspecified: Secondary | ICD-10-CM | POA: Diagnosis present

## 2017-02-08 DIAGNOSIS — Z9981 Dependence on supplemental oxygen: Secondary | ICD-10-CM

## 2017-02-08 DIAGNOSIS — I2782 Chronic pulmonary embolism: Secondary | ICD-10-CM | POA: Diagnosis present

## 2017-02-08 DIAGNOSIS — Z91041 Radiographic dye allergy status: Secondary | ICD-10-CM

## 2017-02-08 DIAGNOSIS — Z91013 Allergy to seafood: Secondary | ICD-10-CM

## 2017-02-08 DIAGNOSIS — I272 Pulmonary hypertension, unspecified: Principal | ICD-10-CM | POA: Diagnosis present

## 2017-02-08 DIAGNOSIS — Z9851 Tubal ligation status: Secondary | ICD-10-CM

## 2017-02-08 DIAGNOSIS — J9611 Chronic respiratory failure with hypoxia: Secondary | ICD-10-CM | POA: Diagnosis present

## 2017-02-08 DIAGNOSIS — Z888 Allergy status to other drugs, medicaments and biological substances status: Secondary | ICD-10-CM

## 2017-02-08 DIAGNOSIS — Z885 Allergy status to narcotic agent status: Secondary | ICD-10-CM

## 2017-02-08 DIAGNOSIS — Z87442 Personal history of urinary calculi: Secondary | ICD-10-CM

## 2017-02-08 DIAGNOSIS — Z79899 Other long term (current) drug therapy: Secondary | ICD-10-CM

## 2017-02-08 DIAGNOSIS — I361 Nonrheumatic tricuspid (valve) insufficiency: Secondary | ICD-10-CM | POA: Diagnosis present

## 2017-02-08 DIAGNOSIS — Z967 Presence of other bone and tendon implants: Secondary | ICD-10-CM | POA: Diagnosis present

## 2017-02-08 HISTORY — DX: Other pulmonary embolism without acute cor pulmonale (CMS HCC): I26.99

## 2017-02-08 HISTORY — DX: Dependence on supplemental oxygen: Z99.81

## 2017-02-08 HISTORY — DX: Depression, unspecified: F32.A

## 2017-02-08 HISTORY — DX: Pulmonary hypertension, unspecified (CMS HCC): I27.20

## 2017-02-08 HISTORY — DX: Personal history of other diseases of the nervous system and sense organs: Z86.69

## 2017-02-08 HISTORY — DX: Calculus of kidney: N20.0

## 2017-02-08 HISTORY — DX: Hereditary deficiency of other clotting factors (CMS HCC): D68.2

## 2017-02-08 HISTORY — DX: Activated protein C resistance (CMS HCC): D68.51

## 2017-02-08 NOTE — Ancillary Notes (Signed)
QSOFA SCREENING    qSOFA Score >= 2 = Positive              Value               Score    Respiratory Rate >= 22 breaths per minute = 1 point 16 0   Systolic Blood Pressure <= 100 mmHg = 1 point 98 1   Altered Mental Status: GCS <15 = 1 point 15 0   Total Score  1     Serum Lactate Level > = 934mmol/L (36 mg/ dL)= Positive:  Initial call level (if not available put NA) na   Followed up call level         Positive Screen: no  Dr. Kirk RuthsAljohani spoke with Dr. Concha NorwayAkbar and accepted patient to step down cardiology blue.     Hetty ElyEtta Montrice Gracey, RN

## 2017-02-09 ENCOUNTER — Other Ambulatory Visit: Payer: Self-pay

## 2017-02-09 ENCOUNTER — Inpatient Hospital Stay (HOSPITAL_COMMUNITY): Payer: Medicare Other

## 2017-02-09 ENCOUNTER — Encounter (HOSPITAL_COMMUNITY): Payer: Self-pay

## 2017-02-09 DIAGNOSIS — I361 Nonrheumatic tricuspid (valve) insufficiency: Secondary | ICD-10-CM

## 2017-02-09 DIAGNOSIS — I272 Pulmonary hypertension, unspecified: Secondary | ICD-10-CM | POA: Diagnosis present

## 2017-02-09 DIAGNOSIS — R9431 Abnormal electrocardiogram [ECG] [EKG]: Secondary | ICD-10-CM

## 2017-02-09 DIAGNOSIS — J9611 Chronic respiratory failure with hypoxia: Secondary | ICD-10-CM

## 2017-02-09 DIAGNOSIS — R0600 Dyspnea, unspecified: Secondary | ICD-10-CM

## 2017-02-09 DIAGNOSIS — I371 Nonrheumatic pulmonary valve insufficiency: Secondary | ICD-10-CM

## 2017-02-09 DIAGNOSIS — R079 Chest pain, unspecified: Secondary | ICD-10-CM

## 2017-02-09 DIAGNOSIS — I2699 Other pulmonary embolism without acute cor pulmonale: Secondary | ICD-10-CM

## 2017-02-09 DIAGNOSIS — Z452 Encounter for adjustment and management of vascular access device: Secondary | ICD-10-CM

## 2017-02-09 DIAGNOSIS — I517 Cardiomegaly: Secondary | ICD-10-CM

## 2017-02-09 LAB — CBC WITH DIFF
HCT: 34.4 % (ref 33.5–45.2)
HGB: 11.3 g/dL (ref 11.2–15.2)
MCH: 30.4 pg (ref 27.4–33.0)
MCHC: 33 g/dL (ref 32.5–35.8)
MCV: 92 fL (ref 78.0–100.0)
MPV: 8.6 fL (ref 7.5–11.5)
PLATELETS: 265 10*3/uL (ref 140–450)
RBC: 3.73 10*6/uL (ref 3.63–4.92)
RDW: 15.4 % — ABNORMAL HIGH (ref 12.0–15.0)
WBC: 7.4 x10ˆ3/uL (ref 3.5–11.0)

## 2017-02-09 LAB — DRUG SCREEN, WITH CONFIRMATION, URINE
AMPHETAMINES URINE: NEGATIVE
BARBITURATES URINE: NEGATIVE
BENZODIAZEPINES URINE: POSITIVE — AB
CANNABINOIDS URINE: NEGATIVE
COCAINE METABOLITES URINE: NEGATIVE
CREATININE RANDOM URINE: 86 mg/dL
ECSTASY/MDMA URINE: NEGATIVE
OPIATES URINE (LOW CUTOFF): POSITIVE — AB
OXIDANT-ADULTERATION: NEGATIVE
OXYCODONE URINE: NEGATIVE
SPECIFIC GRAVITY-ADULTERATION: 1.017 g/mL (ref 1.005–1.030)

## 2017-02-09 LAB — PT/INR: PROTHROMBIN TIME: 20.6 seconds — ABNORMAL HIGH (ref 9.5–14.1)

## 2017-02-09 LAB — HEPATIC FUNCTION PANEL
ALBUMIN: 3.4 g/dL — ABNORMAL LOW (ref 3.5–5.0)
ALKALINE PHOSPHATASE: 106 U/L (ref <150)
BILIRUBIN DIRECT: 0.1 mg/dL (ref <0.3)
BILIRUBIN TOTAL: 0.3 mg/dL (ref 0.3–1.3)

## 2017-02-09 LAB — MANUAL DIFFERENTIAL (CELLAVISION)
BASOPHIL #: 0 x10ˆ3/uL (ref 0.00–0.20)
BASOPHIL %: 0 %
EOSINOPHIL #: 0.22 x10ˆ3/uL (ref 0.00–0.50)
EOSINOPHIL %: 3 %
LYMPHOCYTE #: 1.6 x10ˆ3/uL (ref 1.00–4.80)
LYMPHOCYTE %: 22 %
MONOCYTE #: 0.73 x10ˆ3/uL (ref 0.30–1.00)
MONOCYTE %: 10 %
NEUTROPHIL #: 4.86 x10ˆ3/uL (ref 1.50–7.70)

## 2017-02-09 LAB — BASIC METABOLIC PANEL
ANION GAP: 10 mmol/L (ref 4–13)
BUN/CREA RATIO: 20 (ref 6–22)
BUN: 17 mg/dL (ref 8–25)
CALCIUM: 9.2 mg/dL (ref 8.5–10.2)
CHLORIDE: 103 mmol/L (ref 96–111)
CO2 TOTAL: 25 mmol/L (ref 22–32)
CREATININE: 0.87 mg/dL (ref 0.49–1.10)
ESTIMATED GFR: >59 mL/min/1.73mˆ2 (ref >59)
GLUCOSE: 100 mg/dL (ref 65–139)
POTASSIUM: 4.5 mmol/L (ref 3.5–5.1)
SODIUM: 138 mmol/L (ref 136–145)

## 2017-02-09 LAB — PROCALCITONIN: PROCALCITONIN: 0.07 ng/mL (ref <0.50)

## 2017-02-09 LAB — TROPONIN-I: TROPONIN I: <7 ng/L (ref 0–30)

## 2017-02-09 LAB — B-TYPE NATRIURETIC PEPTIDE: BNP: 102 pg/mL — ABNORMAL HIGH (ref <=100)

## 2017-02-09 LAB — MAGNESIUM: MAGNESIUM: 2.4 mg/dL (ref 1.6–2.5)

## 2017-02-09 LAB — PHOSPHORUS: PHOSPHORUS: 4.2 mg/dL (ref 2.4–4.7)

## 2017-02-09 MED ORDER — ZOLPIDEM 5 MG TABLET
5.0000 mg | ORAL_TABLET | Freq: Once | ORAL | Status: AC
Start: 2017-02-10 — End: 2017-02-09
  Administered 2017-02-09: 5 mg via ORAL
  Filled 2017-02-09: qty 1

## 2017-02-09 MED ORDER — DIPHENHYDRAMINE 50 MG/ML INJECTION SOLUTION
10.0000 mg | Freq: Once | INTRAMUSCULAR | Status: AC
Start: 2017-02-09 — End: 2017-02-09
  Administered 2017-02-09: 10 mg via INTRAVENOUS
  Filled 2017-02-09: qty 1

## 2017-02-09 MED ORDER — FERROUS SULFATE 324 MG (65 MG IRON) TABLET,DELAYED RELEASE
324.00 mg | DELAYED_RELEASE_TABLET | Freq: Two times a day (BID) | ORAL | Status: DC
Start: 2017-02-09 — End: 2017-02-12
  Administered 2017-02-09 – 2017-02-12 (×6): 324 mg via ORAL
  Filled 2017-02-09 (×8): qty 1

## 2017-02-09 MED ORDER — WARFARIN 5 MG TABLET
5.0000 mg | ORAL_TABLET | Freq: Every evening | ORAL | Status: DC
Start: 2017-02-09 — End: 2017-02-10
  Administered 2017-02-09 (×2): 5 mg via ORAL
  Filled 2017-02-09 (×3): qty 1

## 2017-02-09 MED ORDER — ALBUTEROL SULFATE 2.5 MG/3 ML (0.083 %) SOLUTION FOR NEBULIZATION
2.50 mg | INHALATION_SOLUTION | RESPIRATORY_TRACT | Status: DC | PRN
Start: 2017-02-09 — End: 2017-02-12

## 2017-02-09 MED ORDER — PERFLUTREN LIPID MICROSPHERES 1.1 MG/ML INTRAVENOUS SUSPENSION
0.30 mL | INTRAVENOUS | Status: DC
Start: 2017-02-09 — End: 2017-02-11

## 2017-02-09 MED ORDER — CEFTRIAXONE 1 GRAM/50 ML IN DEXTROSE (ISO-OSMOT) INTRAVENOUS PIGGYBACK
1.0000 g | INJECTION | INTRAVENOUS | Status: DC
Start: 2017-02-09 — End: 2017-02-09
  Administered 2017-02-09: 1 g via INTRAVENOUS
  Administered 2017-02-09: 0 g via INTRAVENOUS
  Filled 2017-02-09: qty 50

## 2017-02-09 MED ORDER — PANTOPRAZOLE 40 MG TABLET,DELAYED RELEASE
40.00 mg | DELAYED_RELEASE_TABLET | Freq: Two times a day (BID) | ORAL | Status: DC
Start: 2017-02-09 — End: 2017-02-12
  Administered 2017-02-09 – 2017-02-12 (×8): 40 mg via ORAL
  Filled 2017-02-09 (×7): qty 1

## 2017-02-09 MED ORDER — ACETAMINOPHEN 325 MG TABLET
650.0000 mg | ORAL_TABLET | ORAL | Status: DC | PRN
Start: 2017-02-09 — End: 2017-02-12
  Administered 2017-02-09 – 2017-02-11 (×4): 650 mg via ORAL
  Filled 2017-02-09 (×5): qty 2

## 2017-02-09 MED ORDER — SODIUM CHLORIDE 0.9 % (FLUSH) INJECTION SYRINGE
2.0000 mL | INJECTION | Freq: Three times a day (TID) | INTRAMUSCULAR | Status: DC
Start: 2017-02-09 — End: 2017-02-12
  Administered 2017-02-09: 0 mL
  Administered 2017-02-09 – 2017-02-11 (×7): 2 mL
  Administered 2017-02-11: 0 mL
  Administered 2017-02-11: 2 mL
  Administered 2017-02-12 (×2): 0 mL

## 2017-02-09 MED ORDER — ENOXAPARIN 80 MG/0.8 ML SUBCUTANEOUS SYRINGE
1.0000 mg/kg | INJECTION | Freq: Two times a day (BID) | SUBCUTANEOUS | Status: DC
Start: 2017-02-09 — End: 2017-02-10
  Administered 2017-02-09 – 2017-02-10 (×3): 70 mg via SUBCUTANEOUS
  Filled 2017-02-09 (×5): qty 0.8

## 2017-02-09 MED ORDER — HYDROCODONE 7.5 MG-ACETAMINOPHEN 325 MG TABLET
1.0000 | ORAL_TABLET | Freq: Four times a day (QID) | ORAL | Status: DC | PRN
Start: 2017-02-09 — End: 2017-02-09
  Administered 2017-02-09: 1 via ORAL
  Filled 2017-02-09: qty 1

## 2017-02-09 MED ORDER — MORPHINE 15 MG IMMEDIATE RELEASE TABLET
15.0000 mg | ORAL_TABLET | ORAL | Status: DC | PRN
Start: 2017-02-09 — End: 2017-02-09
  Administered 2017-02-09 (×2): 15 mg via ORAL
  Filled 2017-02-09 (×2): qty 1

## 2017-02-09 MED ORDER — CLONAZEPAM 1 MG TABLET
1.00 mg | ORAL_TABLET | Freq: Two times a day (BID) | ORAL | Status: DC | PRN
Start: 2017-02-09 — End: 2017-02-12
  Administered 2017-02-09 – 2017-02-12 (×8): 1 mg via ORAL
  Filled 2017-02-09 (×8): qty 1

## 2017-02-09 MED ORDER — SODIUM CHLORIDE 0.9 % (FLUSH) INJECTION SYRINGE
2.0000 mL | INJECTION | INTRAMUSCULAR | Status: DC | PRN
Start: 2017-02-09 — End: 2017-02-12

## 2017-02-09 MED ORDER — ENOXAPARIN 40 MG/0.4 ML SUBCUTANEOUS SYRINGE
40.0000 mg | INJECTION | SUBCUTANEOUS | Status: DC
Start: 2017-02-09 — End: 2017-02-09

## 2017-02-09 MED ORDER — DULOXETINE 60 MG CAPSULE,DELAYED RELEASE
60.0000 mg | DELAYED_RELEASE_CAPSULE | Freq: Every day | ORAL | Status: DC
Start: 2017-02-09 — End: 2017-02-12
  Administered 2017-02-09 – 2017-02-12 (×4): 60 mg via ORAL
  Filled 2017-02-09 (×4): qty 1

## 2017-02-09 MED ADMIN — sodium chloride 0.9 % intravenous solution: ORAL | @ 23:00:00 | NDC 00338004902

## 2017-02-09 NOTE — Nurses Notes (Signed)
Patient reports little to no itching after Rocephin administration. VSS.

## 2017-02-09 NOTE — H&P (Signed)
[facility]  Cardiology ADMISSION  History and Physical      Isabella Conrad, [name], 51 y.o. female  Encounter Start Date:  02/08/2017  Inpatient Admission Date: 02/08/2017   Date of Service: 02/09/2017  Admission Source: Outside Facility: What Facility: [facility]  Date of Birth:  12/08/1965  PCP:  Excell Seltzer, MD    Information Obtained from: patient and history reviewed via medical record  Chief Complaint:  Pulmonary Hypertension    HPI:  Isabella Conrad is a 51 y.o. female who presents with c/o pulmonary HTN. 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 Patient states that she presented to [facility] ED 12/27 with dyspnea and chest pain that had come on suddenly. Pain was left sided, pleuritic, and non-radiating. Dyspnea was during both exertion and rest. Symptoms unrelated to position. Not improved by increasing her home O2 to [facility] She reported an associated dry cough as well. No diaphoresis, N/V, or leg swelling/pain.    At [facility] she was evaluated by V/Q scan with showed high probability of PE. Follow-up CTA showed ground glass opacities and chronic appearing pulmonary emboli. She was continued on home warfarin with lovenox bridging and received antibiotics with ceftriaxone and doxycycline for CAP. Doxycycline was discontinued yesterday due to itching. She has been feeling better, except for residual chest pain. She was transferred to [facility] for evaluation of her chronic pulmonary HTN which was thought to be contributing to her symptoms.    Of note she reports falling and breaking the bones of her right foot "in four places". She was wearing a cast until a few days ago when she removed it herself against advice of her orthopedist. She is scheduled to see him in three days to evaluate for further management.    **All Positive review of systems Bolded. Otherwise asked and patient replied Negative.    Constitutional:     Fevers; Chills; Weight loss   Integument:   Rashes;  New or changing skin lesions   Eyes:   Vision changes; Eye pain; Eye discharge   ENMT:   Hearing changes; Nasal congestion; Sore throat; Voice changes   Respiratory:   Nonproductive Cough; Dyspnea   Cardiovascular:   Chest pain; Palpitations; Claudication; Lower extremity edema   Gastrointestinal:   Dysphagia; Nausea; Vomiting; Changes in bowel habits; Melena   Genitourinary:    Dysuria; Hematuria   Neurologic:   Headache; Dizziness; Gait disturbances due to right foot fracture   Psychiatric:   Anhedonia; SI/HI        Cardiac Risk Factors:    Past cardiac history: Yes  Current/Recent Smoker (within 1 year): No  Hypertension:   (BP 140/90): No  Dyslipidemia: (Total Chol greater than 200/LDL greater than 130 mg/dL): No  Fam Hx of Premature CAD:(female less than 39yrs; female less than 66 yrs): No  Cerebrovascular Disease :  No  Peripheral Arterial Disease: No  Diabetes Mellitus: No  Currently On Dialysis:  No  Chronic Lung Disease:  Yes  Cardiac Arrest w/in 24 Hours: No  Prior history of MI:  No  Prior PCI: No  MI this admission: No      CAD Presentation:  No symptoms or angina in last 14 days   Anginal Classification w/in 2 Weeks:  No symptoms  Prior CABG: No  Prior Valve Surgery/Procedure:  No  Pre-operative Evaluation before Non-Cardiac Surgery: No  Prior Heart Failure: No  Cardiomyopathy: No  LV Systolic  Dysfunction:  No  Stress or Imaging Studies Performed:  no  Coagulopathy: No    Past Medical History:   Diagnosis Date    Chronic respiratory failure with hypoxia, on home O2 therapy (CMS HCC)     Depression     Factor II deficiency (CMS HCC)     Factor V Leiden mutation (CMS HCC)     Hx of migraines     Kidney stones     Pulmonary emboli (CMS HCC)     Multiple (at least 13) per patient.    Pulmonary hypertension (CMS HCC)     Chronic on home O2     Past Surgical History:   Procedure Laterality Date    HX EMBOLECTOMY  02/2015    Pulmonary artery.  [facility]    HX TUBAL LIGATION      ORIF TIBIAL SHAFT FRACTURE W/ PLATES AND SCREWS Left     VENA CAVA FILTER PLACEMENT  2006    Still in place per patient       Prior to Admission Medications:  Medications Prior to Admission     Prescriptions    albuterol sulfate (PROVENTIL) 2.5 mg /3 mL (0.083 %) Inhalation Solution for Nebulization    2.5 mg by Nebulization route Every 4 hours as needed for Wheezing    clonazePAM (KLONOPIN) 1 mg Oral Tablet    Take 1 mg by mouth Twice daily    DULoxetine (CYMBALTA DR) 60 mg Oral Capsule, Delayed Release(E.C.)    Take 60 mg by mouth Once a day    enoxaparin (LOVENOX) 60 mg/0.6 mL Subcutaneous Syringe    60 mg by Subcutaneous route Every 12 hours    ferrous sulfate (FERATAB) 324 mg (65 mg iron) Oral Tablet, Delayed Release (E.C.)    Take 324 mg by mouth Twice daily    pantoprazole (PROTONIX) 40 mg Oral Tablet, Delayed Release (E.C.)    Take 40 mg by mouth Twice daily    warfarin (COUMADIN) 5 mg Oral Tablet    Take 5 mg by mouth Once a day    zolpidem (AMBIEN) 10 mg Oral Tablet    Take 10 mg by mouth Every night as needed for Insomnia        Allergies   Allergen Reactions    Iv Contrast Anaphylaxis    Hydromorphone      NAUSEA    Iodinated Contrast- Oral And Iv Dye     Ketorolac      RASH    Penicillins      RASH    Prednisone      ANAPHYLAXIS    Shellfish [Crab]      Dye Allergy:  Yes  Iodine Allergy:  No    Family History:  Family Medical History:     Problem Relation (Age of Onset)    COPD Brother      Negative for Pulm HTN    Social History:  Social History     Tobacco Use    Smoking status: Never Smoker    Smokeless tobacco: Never Used   Substance Use Topics    Alcohol use: Never     Frequency: Never         Exam:  Heart Rate: 86  BP (Non-Invasive): 134/71  Respiratory Rate: 16  SpO2-1: 100 %  Pain Score (Numeric, Faces): 8  Constitutional: Vitals signs reviewed. Conversant and in NAD.   Eyes: Sclerae non-icteric. Pupils Symmetric.    HENT: Atraumatic.  Mucous membranes moist with  no oropharyngeal lesions.    Neck: Trachea midline. No cervical lymphadenopathy.   Respiratory: CTAB. Normal respiratory effort.   Cardiovascular: RRR, no murmurs. No LE edema or varicosities.   Abdomen: Bowel sounds normal, soft, non-tender abdomen. No hepatomegaly.    Skin: Normal temperature and texture.  Excoriations over arms and legs.  Psych: AAOx3. Appropriate affect and mood.       Labs:  I have reviewed all lab results.    INR: 1.78  Troponin: <7  BNP: 102    Imaging Studies:  CXR: Port in place.    Previous echo results:  None  Previous ECG results:  NSR  Previous cath results:  None  Previous stess results:  None    [facility].  My summary is as follows: See HPI.  CTA Chest from [facility] showed: non-specific ground glass opacities and chronic pulmonary emboli.      Patient has decision making capacity:  Yes  Advance Directive:  No Advance Directive  Code Status:  Full Code    Assessment/Plan:    Active Hospital Problems:    Pulmonary hypertension  Chronic hypoxic respiratory failure  - Given history of multiple PEs, CTEPH should be considered. May need repeat V/Q scan.  - TTE to evaluate cardiac structure and function  - Possible RHC to evaluate severity of pulmonary HTN  - Continue supplemental O2    Suspected PNA  - Patient denied any preceding fevers or sputum production. CXR here does not show a definitive consolidation  - Will complete 5 day course given that it was [facility] at [facility].    Left foot fracture  - Non-weight bearing on the right foot  - Will follow-up with her orthopedist as an outpatient.    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      Analgesia/Sedation: Morphine  Antibiotics: Ceftriaxone  Prophylaxis:   DVT: Warfarin/lovenox    GI: PTX   Bowel: None  Diet: Cardiac  Activity Orders: Up in chair, NWB right food  Therapy: None  Code status:  Full    Zetta BillsJay Patel, D.O.  PGY-2    Internal Medicine  [facility] [place]      Late entry for 02/09/17. I saw and examined the patient.  I reviewed the resident's note.  I agree with the findings and plan of care as documented in the resident's note.  Any exceptions/additions are edited/noted.    Andrey SpearmanGeorge G Nare Gaspari, DO

## 2017-02-09 NOTE — Progress Notes (Signed)
Interval note:        [facility]  Advanced Heart Failure & Pulmonary Hypertension Progress Note    Isabella Conrad  Date of service: 02/09/2017  Date: 02/09/17   LOS: 1 days    Subjective: Patient states she is dyspneic on exertion and rest. Wears home oxygen 2L. She says she feels much worse than she did when she was at [facility] 2 years ago. While at [facility] she had a thrombectomy done and was put on xarelto and eliquis, then developed hematuria. Urology got involved, did a cysto and switched her to coumadin. Upon reviewing records, she was prescribed adempas, but she says she has never taken it and doesn't know what it is. She moved to [place] because her brother lives there and that's her only family other than 2 sons who lived in [place] and [place]. She said she has pain below her neck, but she doesn't take anything at home. When asked what she does to manage her pain, she said she goes to her PCP and they give her morphine. I asked if she gets IV morphine at the offiice? She then changed her story and said she goes to the ER. After reviewing narc history, she visits the ED for pain medication many times and has had narcan used on her in 2018.     Date 02/08/17 0700 - 02/09/17 0659 02/09/17 0700 - 02/10/17 0659   Shift 0700-1459 1500-2259 2300-0659 24 Hour Total 0700-1459 1500-2259 2300-0659 24 Hour Total   INTAKE   P.O.   200 200 200   200     Oral   200 200 200   200   I.V.     5   5     Med (IV) Flush Volume     5   5   Shift Total(mL/kg)   045(4.09) 811(9.14) 205(2.89)   205(2.89)   OUTPUT   Urine   850(1.5) 850(0.5)         Urine (Voided)   850 850       Shift Total(mL/kg)   850(11.97) 850(11.97)       Weight (kg)   71 71 71 71 71 71       Vitals:  Filed Vitals:    02/08/17 2358 02/09/17 0430 02/09/17 0827 02/09/17 1253   BP: 134/71 122/84 (!) 147/73 113/71   Pulse: 86 93 100 84   Resp: 16 16 16 18    Temp:  36.8 C (98.2 F) 36.7 C (98.1 F) 36.9 C (98.4 F)   SpO2: 100% 99% 98% 95%     Physical Exam:    Constitutional: No acute distress, well appearing and well nourished. Appears alert, appears stated age and cooperative. Mildly overweight.   Eyes: left eye and right eye without obvious abnormalities, no scleral jaundice or conjunctival erythema noted.   Head: Normocephalic, without obvious abnormality, atraumatic   Neck: Supple, symmetric, trachea midline, no masses. The thyroid appears normal, no thyromegaly. There is no jugular venous distention.   Respiratory: Lungs are diminished to auscultation. No wheezing, rales or rhonchi. No acute distress noted   Cardiovascular: Non-displaced PMI. Regular rhythm, Normal rate. S1, S2 are normal. P2 is of increased intensity consistent with pulmonary hypertension. "There were no murmurs appreciated, no click, rub or gallops noted.   Lower Extremities: 2+ dorsalis pedis pulses bilaterally. She had no cyanosis or clubbing. No lower extremity edema. .   Gastrointestinal: Normal bowel sounds Non-tender, no masses. No hepatomegaly or splenomegaly. No hepatojugular  reflux No abdominal bruit noted.   Musculoskeletal: No joint tenderness, obvious deformity or swelling.   Skin: normal coloration and turgor, no rashes, no suspicious skin lesions noted.   Neurologic: No focal deficits ,alert and oriented X 3,Normal coordination and gait.   Genitourinary: was not performed as patient deferred.   Lymphatic: no palpable lymphadenopathy.   Psychiatric: appears in good mood, fluent in speech and cognition.   Labs - Review of Data   CBC Results Differential Results   Recent Results (from the past 30 hour(s))   CBC WITH DIFF    Collection Time: 02/09/17  1:11 AM   Result Value    WBC 7.4    HGB 11.3    HCT 34.4    PLATELETS 265    Recent Results (from the past 30 hour(s))   MANUAL DIFFERENTIAL (CELLAVISION)    Collection Time: 02/09/17  1:11 AM   Result Value    NEUTROPHIL % 66    LYMPHOCYTE % 22    MONOCYTE % 10    EOSINOPHIL % 3    BASOPHIL % 0    BASOPHIL # 0.00   CBC WITH  DIFF    Collection Time: 02/09/17  1:11 AM   Result Value    WBC 7.4      BMP Results Other Chemistries Results   Results for orders placed or performed during the hospital encounter of 02/08/17 (from the past 30 hour(s))   BASIC METABOLIC PANEL    Collection Time: 02/09/17  1:11 AM   Result Value    SODIUM 138    POTASSIUM 4.5    CHLORIDE 103    CO2 TOTAL 25    GLUCOSE 100    BUN 17    CREATININE 0.87    Recent Results (from the past 30 hour(s))   PHOSPHORUS    Collection Time: 02/09/17  1:11 AM   Result Value    PHOSPHORUS 4.2   MAGNESIUM    Collection Time: 02/09/17  1:11 AM   Result Value    MAGNESIUM 2.4      Liver/Pancreas Enzyme Results Liver Function Results   Recent Results (from the past 30 hour(s))   HEPATIC FUNCTION PANEL    Collection Time: 02/09/17  1:11 AM   Result Value    ALKALINE PHOSPHATASE 106    ALT (SGPT) 48    AST (SGOT) 22    Recent Results (from the past 30 hour(s))   HEPATIC FUNCTION PANEL    Collection Time: 02/09/17  1:11 AM   Result Value    ALBUMIN 3.4 (L)    BILIRUBIN TOTAL 0.3    BILIRUBIN DIRECT 0.1      Cardiac Results Coags Results   Results for orders placed or performed during the hospital encounter of 02/08/17 (from the past 30 hour(s))   TROPONIN-I    Collection Time: 02/09/17  1:11 AM   Result Value    TROPONIN I <7   B-TYPE NATRIURETIC PEPTIDE    Collection Time: 02/09/17  1:11 AM   Result Value    BNP 102 (H)    Recent Results (from the past 30 hour(s))   PT/INR    Collection Time: 02/09/17  1:11 AM   Result Value    PROTHROMBIN TIME 20.6 (H)    INR 1.78 (H)            Medications     Current Facility-Administered Medications:  albuterol (PROVENTIL) 2.5 mg / 3 mL (0.083%) neb solution 2.5 mg  Nebulization Q4H PRN   cefTRIAXone (ROCEPHIN) 1 g in iso-osmotic 50 mL premix IVPB 1 g Intravenous Q24H   clonazePAM (KLONOPIN) tablet 1 mg Oral 2x/day PRN   DULoxetine (CYMBALTA) delayed release capsule 60 mg Oral Daily   enoxaparin PF (LOVENOX) 80 mg/0.8 mL SubQ injection 1 mg/kg  Subcutaneous Q12H   ferrous sulfate 324 mg (65 mg elemental IRON) tablet 324 mg Oral 2x/day   NS flush syringe 2 mL Intracatheter Q8HRS   And      NS flush syringe 2-6 mL Intracatheter Q1 MIN PRN   pantoprazole (PROTONIX) delayed release tablet 40 mg Oral 2x/day   perflutren lipid microspheres (DEFINITY) 1.1 mg/mL injection 0.3 mL Intravenous Give in Cardiology   warfarin (COUMADIN) tablet 5 mg Oral NIGHTLY       VTE prophylaxis: Heparin/LMWH, warfarin/other anticoagulant   Ulcer prophylaxis: PPI   Family communication: Patient has no one she wants us to contact  Code status: full   Echocardiogram:   In process    Chest x Ray:  There is no focal consolidation.  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   Continue warfarin  awaiting TTE read  reviewed [facility] records via care everywhere  Continue lovenox to bridge to therapeutic INR  Patient states she has had 13 PE's during her life  Will evaluate need for adempas  Daily labs  Strict I &O    Questionable Pneumonia  [facility]  Rad [facility]  Rad did not see acute PE or evidence of pneumonia per read  Will DC abx    Depression  Continue Klonopin 1mg  PO PRN  Continue Cymbalta    Narcotic seeking behavior  Continues to ask for strong pain medication  records of going to ER multiple times this year for narcs  narcan administered on her this year  Do not give anything but tylenol    I have spent 45 minutes with this patient today.    Farrel GobbleJada Powell APRN, FNP-C  Advanced Heart Failure - Cards Blue      I personally saw and examined the patient on 02/09/17. See Nurse Practitioner's note for additional details. My findings are mild volume overload. Plan for some diuresis and plan to discharge hom later this week after RHC to determine filling pressures and need for PAH/CTEPH directed therapy. [facility]btain more records from [facility].     Andrey SpearmanGeorge G Brenleigh Collet, DO

## 2017-02-09 NOTE — Care Plan (Signed)
Pt needing medicaid transport at time of d/c.

## 2017-02-09 NOTE — Care Management Notes (Signed)
[facility] Initial Evaluation    Patient Name: Isabella Conrad  Date of Birth: [birth year]  Sex: female  Date/Time of Admission: 02/08/2017 11:51 PM  Room/Bed: 28/A  Payor: MEDICARE / Plan: MEDICARE PART A AND B / Product Type: Medicare /   Primary Care Providers:  Cecille Amsterdam, MD, MD (General)    Pharmacy Info:   Preferred Pharmacy     [facility]    [place] [address]    Phone: [phone] Fax: [phone]    Not a 24 hour pharmacy; exact hours not known        Emergency Contact Info:   Extended Emergency Contact Information  Primary Emergency Contact: Verdia [place] Phone: [phone]  Relation: Brother    History:   Isabella Conrad is a 51 y.o., female, admitted  Pulmonary HTN.     Height/Weight: 162.6 cm (_0 ) / 71 kg (156 lb 8.4 oz)     LOS: 1 day   Admitting Diagnosis: Pulmonary HTN (CMS HCC) [I27.20]    Assessment:      02/09/17 1531   Assessment Details   Assessment Type Admission   Date of Care Management Update 02/09/17   Date of Next DCP Update 02/12/17   Care Management Plan   Discharge Planning Status initial meeting   Projected Discharge Date 02/12/17   Discharge Needs Assessment   Equipment Currently Used at Home oxygen  ([employer]- 2L)   Equipment Needed After Discharge none   Discharge Facility/Level of Care Needs Home (Patient/Family Member/other)(code 1)   Transportation Available family or friend will provide   Referral Information   Admission Type inpatient   Address Verified verified-no changes   Arrived From acute hospital, other   [place] verified-no change   ADVANCE DIRECTIVES   Does the Patient have an Advance Directive? Yes, Patient Does Have Advance Directive for Healthcare Treatment   Type of Advance Directive Completed Medical Living Will;Medical Power of Attorney   Copy of Advance Directives in Chart? [place]? I Decline   Employment/Financial   Patient has Prescription Coverage?  Yes        Name of Insurance Coverage for Medications Medicare   Financial Concerns none   Living Environment   Select an age group to open "lives with" row.  Adult   Lives With alone   Living Arrangements mobile home   Home Safety   Home Assessment: No Problems Identified   Home Accessibility stairs to enter home   Living Environment   Number of Stairs to Enter Home 5       Discharge Plan:  Home (Patient/Family Member/other) (code 1)  MSW met with pt to discuss d/c planning. Pt confirmed address, insurance, and updated PCP and Pharmacy. Pt lives in mobile home with 5steps to enter. Pt on continuous O2-3L provided by [employer]. Pt stated he has MPOA. MSW requested copy.  Pt will need medicaid transport at d/c.    The patient will continue to be evaluated for developing discharge needs.     Case Manager: Rulon Abide  Phone: [phone]

## 2017-02-09 NOTE — Nurses Notes (Signed)
Upon administering patient Rocephin to patient, patient states "I need IV Benadryl or I'll start itching." [facility] All[facility] KatzPatel MD notified. Orders placed for IV Benadryl prior to starting Rocephin. Will continue to monitor.

## 2017-02-09 NOTE — Care Plan (Signed)
Patient was [facility] after having a V/Q scan that showed high probability of PE and is being evaluated for chronic pulmonary hypertension. Patient recently had a cast on her right foot which was broken from a fall, she removed the cast herself prior. Patient is alert and oriented x4. VSS, has complaints of pain in her chest related to breathing which is relieved by PRN Morphine. Positioned/ repositioned independently. Sitter select placed under patient, patient instructed to call prior to attempting to get up. VS and assessment per flow sheet.

## 2017-02-09 NOTE — Care Plan (Signed)
Patient admitted to 9SE bed 28, patient oriented to room and call bell, no complaints at this time.

## 2017-02-09 NOTE — Pharmacy (Signed)
[facility]  Therapeutic Drug Monitoring: Warfarin Progress Note    Ron AgeeShannon Dhanani is a 51 y.o. year old female on warfarin for CTEPH with a history of multiple pulmonary embolisms.      Goal INR: 2-3    Home dose: Warfarin 5 mg however suspect some aspect of noncompliance     Outpatient INR Management: TBD    Inpatient Management: cardiology blue    Concurrent Antiplatelet Agents: None    Inpatient Warfarin Dosing:     Date INR Warfarin Dose   (@ 21:00) Bridging Regimen Drug Interactions Comments   12/31 1.78 (5 mg) Enoxaparin 70 mg q12h Duloxetine                                       Assessment/Plan:  Ron AgeeShannon Bartolomei 's INR today is subtherapeutic.    Pharmacist recommendation: Would recommend continuing warfarin 5 mg tonight as it is unclear if the patient has been consistently taking her warfarin. Will continue monitor daily.    Monitoring:  Please continue to monitor PT/INR daily.  Monitor for signs/symptoms of bleeding.   Pharmacist will continue to make daily warfarin recommendations.   Please contact pharmacy with any questions.

## 2017-02-10 ENCOUNTER — Encounter (HOSPITAL_COMMUNITY): Payer: Self-pay | Admitting: Student in an Organized Health Care Education/Training Program

## 2017-02-10 DIAGNOSIS — F329 Major depressive disorder, single episode, unspecified: Secondary | ICD-10-CM

## 2017-02-10 LAB — TRANSTHORACIC ECHOCARDIOGRAM - ADULT
AV peak velocity post stress: 109
AVA VTI: 2
AVA Vmax: 2
EF: 62
Interventricular Septum Diastolic Thickness by 2D: 1.3 cm
LVIDS 2D: 2.6 cm
LVPWD: 0.9 cm
MV E/A: 1
Please see Linked Document for Final Report.: ABNORMAL
Please see Linked Document for Final Report.: NORMAL
TR VELOCITY: 457

## 2017-02-10 LAB — BASIC METABOLIC PANEL
ANION GAP: 9 mmol/L (ref 4–13)
BUN/CREA RATIO: 20 (ref 6–22)
BUN: 17 mg/dL (ref 8–25)
CALCIUM: 9.6 mg/dL (ref 8.5–10.2)
CHLORIDE: 102 mmol/L (ref 96–111)
CO2 TOTAL: 27 mmol/L (ref 22–32)
CREATININE: 0.85 mg/dL (ref 0.49–1.10)
ESTIMATED GFR: >59 mL/min/1.73mˆ2 (ref >59)
GLUCOSE: 102 mg/dL (ref 65–139)
POTASSIUM: 4.7 mmol/L (ref 3.5–5.1)
SODIUM: 138 mmol/L (ref 136–145)

## 2017-02-10 LAB — CBC
HCT: 35.9 % (ref 33.5–45.2)
HGB: 11.7 g/dL (ref 11.2–15.2)
MCH: 30.2 pg (ref 27.4–33.0)
MCHC: 32.6 g/dL (ref 32.5–35.8)
MCV: 92.9 fL (ref 78.0–100.0)
MPV: 9.2 fL (ref 7.5–11.5)
PLATELETS: 300 x10ˆ3/uL (ref 140–450)
RBC: 3.87 x10ˆ6/uL (ref 3.63–4.92)
RDW: 15.5 % — ABNORMAL HIGH (ref 12.0–15.0)
WBC: 6.2 x10ˆ3/uL (ref 3.5–11.0)

## 2017-02-10 LAB — MAGNESIUM: MAGNESIUM: 2.6 mg/dL — ABNORMAL HIGH (ref 1.6–2.5)

## 2017-02-10 LAB — PT/INR
INR: 2.13 — ABNORMAL HIGH (ref 0.80–1.20)
PROTHROMBIN TIME: 24.6 seconds — ABNORMAL HIGH (ref 9.5–14.1)

## 2017-02-10 MED ORDER — WARFARIN 2 MG TABLET
3.00 mg | ORAL_TABLET | Freq: Every evening | ORAL | Status: DC
Start: 2017-02-10 — End: 2017-02-12
  Administered 2017-02-10 – 2017-02-11 (×2): 3 mg via ORAL
  Filled 2017-02-10 (×3): qty 1

## 2017-02-10 MED ORDER — ZOLPIDEM 5 MG TABLET
5.0000 mg | ORAL_TABLET | Freq: Every evening | ORAL | Status: DC
Start: 2017-02-10 — End: 2017-02-12
  Administered 2017-02-10 – 2017-02-11 (×2): 5 mg via ORAL
  Filled 2017-02-10 (×2): qty 1

## 2017-02-10 MED ORDER — ACETAMINOPHEN 325 MG TABLET
650.0000 mg | ORAL_TABLET | Freq: Once | ORAL | Status: AC
Start: 2017-02-10 — End: 2017-02-10
  Administered 2017-02-10 (×2): 650 mg via ORAL
  Filled 2017-02-10: qty 2

## 2017-02-10 NOTE — Progress Notes (Signed)
Interval note:        [facility]  Advanced Heart Failure & Pulmonary Hypertension Progress Note    Ron AgeeShannon Parma  Date of service: 02/10/2017  Date: 02/10/17   LOS: 2 days    Subjective: Feeling better today, asking about getting a right heart cath, denies chest pain.    Date 02/09/17 1500 - 02/10/17 0659 02/10/17 0700 - 02/11/17 0659   Shift 1500-2259 2300-0659 24 Hour Total 0700-1459 1500-2259 2300-0659 24 Hour Total   INTAKE   P.O. 0 200 640 400   400     Oral 0 200 640 400   400   I.V.(mL/kg/hr)   5(0) 5(0.01)   5     Med (IV) Flush Volume   5 5   5    Shift Total(mL/kg) 0(0) 200(2.81) 645(9.07) 405(5.7)   405(5.7)   OUTPUT   Urine(mL/kg/hr) 500(0.88)  500(0.29) 1000(1.76)   1000     Urine (Voided) 500  500 1000   1000     Urine Occurrence 1 x  1 x 1 x   1 x   Stool            Stool Occurrence 2 x  2 x 2 x   2 x   Shift Total(mL/kg) 500(7.04)  500(7.03) 1000(14.06)   1000(14.06)   Weight (kg) 71 71.1 71.1 71.1 71.1 71.1 71.1       Vitals:  Filed Vitals:    02/10/17 0353 02/10/17 0818 02/10/17 1226 02/10/17 1535   BP: 127/78 118/78 107/65 111/72   Pulse: 70 96 96 96   Resp: 14 16 16 16    Temp: 36.5 C (97.7 F) 36.5 C (97.7 F) 36.7 C (98 F) 36.9 C (98.4 F)   SpO2: 97% 97% 93% 98%     Physical Exam:   Constitutional: No acute distress, well appearing and well nourished. Appears alert, appears stated age and cooperative. Mildly overweight.   Eyes: left eye and right eye without obvious abnormalities, no scleral jaundice or conjunctival erythema noted.   Head: Normocephalic, without obvious abnormality, atraumatic   Neck: Supple, symmetric, trachea midline, no masses. The thyroid appears normal, no thyromegaly. There is no jugular venous distention.   Respiratory: Lungs are diminished to auscultation. No wheezing, rales or rhonchi. No acute distress noted   Cardiovascular: Non-displaced PMI. Regular rhythm, Normal rate. S1, S2 are normal. P2 is of increased intensity consistent with pulmonary  hypertension. "There were no murmurs appreciated, no click, rub or gallops noted.   Lower Extremities: 2+ dorsalis pedis pulses bilaterally. She had no cyanosis or clubbing. No lower extremity edema. .   Gastrointestinal: Normal bowel sounds Non-tender, no masses. No hepatomegaly or splenomegaly. No hepatojugular reflux No abdominal bruit noted.   Musculoskeletal: No joint tenderness, obvious deformity or swelling.   Skin: normal coloration and turgor, no rashes, no suspicious skin lesions noted.   Neurologic: No focal deficits ,alert and oriented X 3,Normal coordination and gait.   Genitourinary: was not performed as patient deferred.   Lymphatic: no palpable lymphadenopathy.   Psychiatric: appears in good mood, fluent in speech and cognition.   Labs - Review of Data   CBC Results Differential Results   No results found for this or any previous visit (from the past 30 hour(s)). No results found for this or any previous visit (from the past 30 hour(s)).   BMP Results Other Chemistries Results   Results for orders placed or performed during the hospital encounter of 02/08/17 (from the  past 30 hour(s))   BASIC METABOLIC PANEL    Collection Time: 02/10/17  5:32 AM   Result Value    SODIUM 138    POTASSIUM 4.7    CHLORIDE 102    CO2 TOTAL 27    GLUCOSE 102    BUN 17    CREATININE 0.85    Recent Results (from the past 30 hour(s))   MAGNESIUM    Collection Time: 02/10/17  5:32 AM   Result Value    MAGNESIUM 2.6 (H)      Liver/Pancreas Enzyme Results Liver Function Results   No results found for this or any previous visit (from the past 30 hour(s)). No results found for this or any previous visit (from the past 30 hour(s)).   Cardiac Results Coags Results   No results found for this or any previous visit (from the past 30 hour(s)). Recent Results (from the past 30 hour(s))   PT/INR    Collection Time: 02/10/17  9:25 AM   Result Value    PROTHROMBIN TIME 24.6 (H)    INR 2.13 (H)            Medications     Current  Facility-Administered Medications:  acetaminophen (TYLENOL) tablet 650 mg Oral Q4H PRN   albuterol (PROVENTIL) 2.5 mg / 3 mL (0.083%) neb solution 2.5 mg Nebulization Q4H PRN   clonazePAM (KLONOPIN) tablet 1 mg Oral 2x/day PRN   DULoxetine (CYMBALTA) delayed release capsule 60 mg Oral Daily   ferrous sulfate 324 mg (65 mg elemental IRON) tablet 324 mg Oral 2x/day   NS flush syringe 2 mL Intracatheter Q8HRS   And      NS flush syringe 2-6 mL Intracatheter Q1 MIN PRN   pantoprazole (PROTONIX) delayed release tablet 40 mg Oral 2x/day   perflutren lipid microspheres (DEFINITY) 1.1 mg/mL injection 0.3 mL Intravenous Give in Cardiology   warfarin (COUMADIN) tablet 3 mg 3 mg Oral NIGHTLY       VTE prophylaxis: Heparin/LMWH, warfarin/other anticoagulant   Ulcer prophylaxis: PPI   Family communication: Patient has no one she wants [place] to contact  Code status: full   Echocardiogram:   In process    Chest x Ray:  There is no focal consolidation.  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    Depression  Continue Klonopin 1mg  PO PRN  Continue Cymbalta    Narcotic seeking behavior  Continues to ask for strong pain medication  records of going to ER multiple times this year for narcs  narcan administered on her this year  Will hold further opiates.    Sami Maryan Rued, MD  Fellow, Division of Cardiology   [facility]    Late entry for 02/10/17. I saw and examined the patient.  I reviewed the fellow's note.  I agree with the findings and plan of care as documented in the fellow's note.  Any exceptions/additions are edited/noted.      Andrey Spearman Sokos, DO [facility]

## 2017-02-10 NOTE — Care Plan (Signed)
Complaints of pain unrelieved by PRN medications per MAR. No complaints of cardiac related chest pain his shift. Shortness of breath reported and patient remains on 2L NC, saturations in high 90's. Fall precautions and sitter select maintained and patient remains fall free thus far. Activity encouraged, patient up with standby assistance. Patient to receive right heart cath tomorrow to assess pulmonary HTN. Patient may be discharged following heart cath. Plan of care reviewed with patient.

## 2017-02-10 NOTE — Pharmacy (Addendum)
[facility]  Therapeutic Drug Monitoring: Warfarin Progress Note    Isabella Conrad is a 52 y.o. year old female on warfarin for CTEPH with a history of multiple pulmonary embolisms.      Goal INR: 2-3    Home dose: Warfarin 5 mg however suspect some aspect of noncompliance     Outpatient INR Management: TBD    Inpatient Management: cardiology blue    Concurrent Antiplatelet Agents: None    Inpatient Warfarin Dosing:     Date INR Warfarin Dose   (@ 21:00) Bridging Regimen Drug Interactions Comments   12/31 1.78 5 mg Enoxaparin 70 mg q12h Duloxetine     01/01 2.13 (3 mg) Enoxparin 70 mg q12h Duloxetine                              Assessment/Plan:  Isabella Conrad 's INR today is therapeutic.    Pharmacist recommendation: Would recommend discontinuation of enoxaparin as INR therapeutic. Dose difficult to discern due to history of noncompliance agree with reducing warfarin to 3 mg. Will continue monitor daily.    Monitoring:  Please continue to monitor PT/INR daily.  Monitor for signs/symptoms of bleeding.   Pharmacist will continue to make daily warfarin recommendations.   Please contact pharmacy with any questions.

## 2017-02-10 NOTE — Care Plan (Signed)
Complaints of pain unrelieved by PRN medications per MAR. No complaints of cardiac related chest pain his shift. Shortness of breath reported and patient remains on 2L NC, saturations in high 90's. Fall precautions and sitter select maintained and patient remains fall free thus far. IV antibiotics discontinued by service. Activity encouraged, patient up with standby assistance. TTE completed today. MD Sokos to perform right heart cath for pulmonary HTN evaluation on Wednesday or Thursday of this week. Plan of care reviewed with patient.

## 2017-02-10 NOTE — Care Plan (Signed)
Isabella Conrad complains of ongoing chest pain aggravated by deep breathing and cough. Per MD notes, no narcotic pain medications to be given, patient states "some" relief with tylenol. Requested qHS ambien as per home meds, one time dose given, encouraged to ask service about ordering this on rounds. She states nonproductive cough continues. O2 and 2L via NC continues. Patient affect is blunted/flat on observation. Fall precautions continue with sitter select in use. Right foot remains reduced in strength with pain on ambulation per patient baseline at admission. Scattered bruising and scabbing noted, no open wounds.

## 2017-02-10 NOTE — Consults (Signed)
[facility]   Medical Oncology/Hematology  Initial Note    Donell, Sliwinski  Encounter Start Date:  02/08/2017  Inpatient Admission Date:  02/08/2017  Date of Service: 02/10/2017  Date of Birth:  [birth year]    Requesting MD: Dr. Sela Hilding    HPI/Discussion:     Isabella Conrad is a 52 y.o. 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    She presented to this Hospital in acute hypoxic respirator failure. She states that she was in an outside institution last week where she was diagnosed with an acute PE. She states that since she moved from [place] to [place] area in [place] in January 2018, the facility that she gets her Coumadin managed at [facility] has been changing her Coumadin doses which were well established before while she was in [facility]. She states that her correct dose of Coumadin that she was on in [facility] was 5mg  every Mondays, Wednesdays, Fridays and 2.5mg  the rest of the week on Tuesdays, Thursdays, Saturdays and Sundays to maintain goal INR of 3-3.5. Her Coumadin doses were changed several times since she moved to [facility]. She had been bridged from Lovenox due to recent gall bladder procedure and was put on 2.5mg  Coumadin daily. She is anxious with her current management of her Coumadin doses here in [place] she states that she she wishes to continue her Coumadin at the best doses, like it was established for her in [place]. She had significant bleeding complications in the past when she was on Xarelto or on Eliquis and she was told,to avoid these drugs in the future. Currently, her local hematologist-medical oncologist Dr. Noreene Filbert is following her Coumadin in [place], [place] (his [phone]). She is satisfied with the care she receives from him.    She is being treated for a 5 day course of empirical CAP process. She also comes in with a L foot fracture receiving supportive care and outpatient  Orthopedics f/u.       Past Medical History:   Diagnosis Date   . Chronic respiratory failure with hypoxia, on home O2 therapy (CMS HCC)    . Depression    . Factor II deficiency (CMS HCC)    . Factor V Leiden mutation (CMS HCC)    . Hx of migraines    . Kidney stones    . Pulmonary emboli (CMS HCC)     Multiple (at least 13) per patient.   . Pulmonary hypertension (CMS HCC)     Chronic on home O2         Past Surgical History:   Procedure Laterality Date   . HX EMBOLECTOMY  02/2015    Pulmonary artery. Performed at [facility].   Marland Kitchen HX TUBAL LIGATION     . ORIF TIBIAL SHAFT FRACTURE W/ PLATES AND SCREWS Left    . VENA CAVA FILTER PLACEMENT  2006    Still in place per patient     Medications Prior to Admission     Prescriptions    albuterol sulfate (PROVENTIL) 2.5 mg /3 mL (0.083 %) Inhalation Solution for Nebulization    2.5 mg by Nebulization route Every 4 hours as needed for Wheezing    clonazePAM (KLONOPIN) 1 mg Oral Tablet    Take 1 mg by mouth Twice daily    DULoxetine (CYMBALTA DR) 60 mg Oral Capsule, Delayed Release(E.C.)    Take 60 mg by  mouth Once a day    enoxaparin (LOVENOX) 60 mg/0.6 mL Subcutaneous Syringe    60 mg by Subcutaneous route Every 12 hours    ferrous sulfate (FERATAB) 324 mg (65 mg iron) Oral Tablet, Delayed Release (E.C.)    Take 324 mg by mouth Twice daily    pantoprazole (PROTONIX) 40 mg Oral Tablet, Delayed Release (E.C.)    Take 40 mg by mouth Twice daily    warfarin (COUMADIN) 5 mg Oral Tablet    Take 5 mg by mouth Once a day    zolpidem (AMBIEN) 10 mg Oral Tablet    Take 10 mg by mouth Every night as needed for Insomnia        Current Facility-Administered Medications:  acetaminophen (TYLENOL) tablet 650 mg Oral Q4H PRN   albuterol (PROVENTIL) 2.5 mg / 3 mL (0.083%) neb solution 2.5 mg Nebulization Q4H PRN   clonazePAM (KLONOPIN) tablet 1 mg Oral 2x/day PRN   DULoxetine (CYMBALTA) delayed release capsule 60 mg Oral Daily   enoxaparin PF (LOVENOX) 80 mg/0.8 mL SubQ injection 1 mg/kg  Subcutaneous Q12H   ferrous sulfate 324 mg (65 mg elemental IRON) tablet 324 mg Oral 2x/day   NS flush syringe 2 mL Intracatheter Q8HRS   And      NS flush syringe 2-6 mL Intracatheter Q1 MIN PRN   pantoprazole (PROTONIX) delayed release tablet 40 mg Oral 2x/day   perflutren lipid microspheres (DEFINITY) 1.1 mg/mL injection 0.3 mL Intravenous Give in Cardiology   warfarin (COUMADIN) tablet 5 mg Oral NIGHTLY     Allergies   Allergen Reactions   . Iv Contrast Anaphylaxis   . Hydromorphone      NAUSEA   . Iodinated Contrast- Oral And Iv Dye    . Ketorolac      RASH   . Penicillins      RASH   . Prednisone      ANAPHYLAXIS   . Shellfish [Crab]    . Rocephin [Ceftriaxone] Itching     Pt states needs IV Benadryl prior to administration.      Family History  Family Medical History:     Problem Relation (Age of Onset)    COPD Brother        Social History  No rec drug, etoh, nicotine abuse hx    REVIEW OF SYSTEMS  Other than ROS in the HPI, all other systems were negative.    EXAM  VITALS:    Temperature: 36.5 C (97.7 F)  Heart Rate: 96  BP (Non-Invasive): 118/78  Respiratory Rate: 16  SpO2-1: 97 %  Pain Score (Numeric, Faces): 5     Constitutional: appears in good health, mildly obese, appears stated age, no distress and vital signs reviewed  Eyes: Conjunctiva clear., Sclera non-icteric.   ENT: ENMT without erythema or injection, mucous membranes moist.  Neck: no thyromegaly or lymphadenopathy  Respiratory: Clear to auscultation bilaterally.   Cardiovascular: regular rate and rhythm  Gastrointestinal: Soft, non-tender, Bowel sounds normal, non-distended  Genitourinary: Deferred  Musculoskeletal: Head atraumatic and normocephalic  Integumentary:  Skin warm and dry  Neurologic: Grossly normal  Lymphatic/Immunologic/Hematologic: No lymphadenopathy  Psychiatric: Normal affect, behavior, memory, thought content, judgement, and speech.    IMAGES:    CXR 02/09/17   IMPRESSION:   There is no focal consolidation.    LABS:    I  have reviewed all lab results.  Lab Results Today:    Results for orders placed or performed during the hospital encounter of 02/08/17 (  from the past 24 hour(s))   DRUG SCREEN, WITH CONFIRMATION, URINE   Result Value Ref Range    BUPRENORPHINE URINE Negative Negative    CANNABINOIDS URINE Negative Negative    COCAINE METABOLITES URINE Negative Negative    METHADONE URINE Negative Negative    OPIATES URINE (LOW CUTOFF) Positive (A) Negative    OXYCODONE URINE Negative Negative    ECSTASY/MDMA URINE Negative Negative    CREATININE RANDOM URINE 86 No Reference Range Established mg/dL    AMPHETAMINES URINE Negative Negative    BARBITURATES URINE Negative Negative    BENZODIAZEPINES URINE Positive (A) Negative    OXIDANT-ADULTERATION Negative Negative    PH-ADULTERATION 6.4 4.5 - 9.0    SPECIFIC GRAVITY-ADULTERATION 1.017 1.005 - 1.030 g/mL   CBC   Result Value Ref Range    WBC 6.2 3.5 - 11.0 x10^3/uL    RBC 3.87 3.63 - 4.92 x10^6/uL    HGB 11.7 11.2 - 15.2 g/dL    HCT 08.635.9 57.833.5 - 46.945.2 %    MCV 92.9 78.0 - 100.0 fL    MCH 30.2 27.4 - 33.0 pg    MCHC 32.6 32.5 - 35.8 g/dL    RDW 62.915.5 (H) 52.812.0 - 15.0 %    PLATELETS 300 140 - 450 x10^3/uL    MPV 9.2 7.5 - 11.5 fL   BASIC METABOLIC PANEL   Result Value Ref Range    SODIUM 138 136 - 145 mmol/L    POTASSIUM 4.7 3.5 - 5.1 mmol/L    CHLORIDE 102 96 - 111 mmol/L    CO2 TOTAL 27 22 - 32 mmol/L    ANION GAP 9 4 - 13 mmol/L    CALCIUM 9.6 8.5 - 10.2 mg/dL    GLUCOSE 413102 65 - 244139 mg/dL    BUN 17 8 - 25 mg/dL    CREATININE 0.100.85 2.720.49 - 1.10 mg/dL    BUN/CREA RATIO 20 6 - 22    ESTIMATED GFR >59 >59 mL/min/1.5772m^2   MAGNESIUM   Result Value Ref Range    MAGNESIUM 2.6 (H) 1.6 - 2.5 mg/dL   PT/INR   Result Value Ref Range    PROTHROMBIN TIME 24.6 (H) 9.5 - 14.1 seconds    INR 2.13 (H) 0.80 - 1.20     Impression/Recommendations:    This is a 52 Y.O F c PMHx notable for Factor 2 deficiency and Factor V Leiden mutation, history of PE's/chronic thromboembolic disease who we are consulted on  anti-coagulation options. She was told that she needed life-long anticoagulation. She has been on Coumadin for prolonged time in the past of 5mg  on every Mondays, Wednesdays, Fridays and 2.5mg  the rest of the week on Tuesdays, Thursdays, Saturdays and Sundays to maintain goal INR of 3-3.5. Her Coumadin doses were changed several times [place] and she has been su-therapeutic with her INR on several occasions. She had significant bleeding complications in the past when she was on Xarelto or on Eliquis and she was told,to avoid these drugs in the future.She wishes to she wishes to stay on Coumadin if possible. Currently, her local hematologist-medical oncologist Dr. Noreene FilbertWaris [place], [place] (his [phone]).    -we agree with repeat V/Q scan to assess for interval thromboembolic process versus CT PE with sensitizing protocol  -agree with bridging Lovenox to Coumadin. As the patient has been under-dosed on her Warfarin prior to her discharge from outside facility, this does not represent failure of Warfarin therapy even  if new clot burden is detected/confirmed. INR at admission was 1.78 which is sub-therapeutic. Would be ideal to have acces to the INR levels that she had prior to admission to ascertain sub-optimal levels for a prolonged period of time. Please obtain the required documents copies    -please increase Coumadin to 5mg  on every Mondays, Wednesdays, Fridays and 2.5mg  the rest of the week on Tuesdays, Thursdays, Saturdays and Sundays to maintain goal INR of 3-3.5. (She was recently getting 17.5mg  total weekly dose whereas her needed weekly dose is 25mg ). However, the INR levels need close monitoring while she take these Coumadin doses again, to make sure the INR stays in the desired range of 3-3.5.  This may be done locally for her with the assistance of her local hematologist-medical oncologist Dr. Noreene Filbert is following her Coumadin in [place], [place] (his [phone]). If  you agree with this management plan then please contact Dr Noreene Filbert and explain this plan to him and ask for his close follow-up the INR levels to ensure that these doses are still the best for her.   - dueto her hypercoagulability, she needs life-long anticoagulation thus establishing this with Dr Noreene Filbert is important with whom she needs close future f/u.   -will need monitoring of her INR to maintain range within 3-3.5 level that is therapeutic for her to ensure no complications/progresion of her clotting issues. If she is planned to be discharged soon, she will need to be bridged with Lovenox and close INR monitoring is necessary for her. When INR becomes in the therapeutic range of 3-3.5 then Lovenox can be discontinued, preferably after 4-5 days or more. This can also be done by Dr Noreene Filbert.   - patient may also be referred to our [facility] for f/u if that is felt to be necessary.    -please call us with any questions, we will sign off unless questions/issues with the above    Hazle Nordmann, MD  02/10/2017, 11:46  Hematology/Oncology Fellow, PGY-4  Department of Medicine  [facility]     THE FOLLOWING WAS ELECTRONICALLY INSERTED BY: Clarene Reamer   I have seen and evaluated this patient. I have reviewed the medical oncology fellow's note and agree with its contents and plans of care. If there are any exceptions or additions, these are listed below.         Clarene Reamer, MD   Associate Professor, Section of Hematology/Oncology    [facility] Department of Medicine

## 2017-02-11 ENCOUNTER — Telehealth (HOSPITAL_COMMUNITY): Payer: Self-pay

## 2017-02-11 ENCOUNTER — Encounter (HOSPITAL_COMMUNITY)
Admission: EM | Disposition: A | Discharge status: Home / Self Care | Payer: Self-pay | Source: Other Acute Inpatient Hospital | Attending: Cardiovascular Disease

## 2017-02-11 DIAGNOSIS — E8770 Fluid overload, unspecified: Secondary | ICD-10-CM

## 2017-02-11 DIAGNOSIS — D6851 Activated protein C resistance: Secondary | ICD-10-CM

## 2017-02-11 DIAGNOSIS — I272 Pulmonary hypertension, unspecified: Principal | ICD-10-CM

## 2017-02-11 DIAGNOSIS — Z86711 Personal history of pulmonary embolism: Secondary | ICD-10-CM

## 2017-02-11 DIAGNOSIS — D682 Hereditary deficiency of other clotting factors: Secondary | ICD-10-CM

## 2017-02-11 LAB — CBC WITH DIFF
BASOPHIL #: 0.04 10*3/uL (ref 0.00–0.20)
BASOPHIL %: 1 %
EOSINOPHIL %: 6 %
HCT: 31.2 % — ABNORMAL LOW (ref 33.5–45.2)
HGB: 10.4 g/dL — ABNORMAL LOW (ref 11.2–15.2)
LYMPHOCYTE %: 33 %
MCH: 30.5 pg (ref 27.4–33.0)
MCHC: 33.3 g/dL (ref 32.5–35.8)
MCV: 91.6 fL (ref 78.0–100.0)
MONOCYTE #: 0.67 10*3/uL (ref 0.30–1.00)
MONOCYTE %: 16 %
MPV: 8.8 fL (ref 7.5–11.5)
NEUTROPHIL %: 44 %
PLATELETS: 233 10*3/uL (ref 140–450)
RBC: 3.41 10*6/uL — ABNORMAL LOW (ref 3.63–4.92)
RDW: 15.2 % — ABNORMAL HIGH (ref 12.0–15.0)
WBC: 4.1 10*3/uL (ref 3.5–11.0)

## 2017-02-11 LAB — TYPE AND SCREEN
ABO/RH(D): A POS
ANTIBODY SCREEN: NEGATIVE

## 2017-02-11 LAB — POCT ISTAT G3 BLOOD GAS (VENOUS RESULTS)
BASE EXCESS (BEP): 1 mEq/L (ref <=3.0)
HCO3 (HCO3P): 27 mmol/L — ABNORMAL HIGH (ref 22–26)
PCO2 (PCO2P): 48 mmHg (ref 41–51)
PH (PHP): 7.36 (ref 7.31–7.41)
PO2 (PO2P): 34 mmHg — ABNORMAL LOW (ref 35–50)
TCO2 (TOC2P): 28 mmol/L (ref 22–32)

## 2017-02-11 LAB — MAGNESIUM: MAGNESIUM: 2.2 mg/dL (ref 1.6–2.5)

## 2017-02-11 LAB — VENOUS BLOOD GAS
%FIO2 (VENOUS): 28 %
BASE EXCESS: 3.1 mmol/L — ABNORMAL HIGH (ref <=3.0)
BICARBONATE (VENOUS): 26.3 mmol/L — ABNORMAL HIGH (ref 22.0–26.0)
PH (VENOUS): 7.37 (ref 7.31–7.41)

## 2017-02-11 LAB — BASIC METABOLIC PANEL, FASTING
ANION GAP: 6 mmol/L (ref 4–13)
BUN/CREA RATIO: 18 (ref 6–22)
CALCIUM: 9.7 mg/dL (ref 8.5–10.2)
CHLORIDE: 104 mmol/L (ref 96–111)
ESTIMATED GFR: >59 mL/min/{1.73_m2} (ref >59)

## 2017-02-11 LAB — PHOSPHORUS: PHOSPHORUS: 3.7 mg/dL (ref 2.4–4.7)

## 2017-02-11 LAB — PT/INR: PROTHROMBIN TIME: 23.5 seconds — ABNORMAL HIGH (ref 9.5–14.1)

## 2017-02-11 SURGERY — RIGHT HEART CATH

## 2017-02-11 MED ORDER — LIDOCAINE HCL 20 MG/ML (2 %) INJECTION SOLUTION
INTRAMUSCULAR | Status: AC
Start: 2017-02-11 — End: 2017-02-11
  Filled 2017-02-11: qty 20

## 2017-02-11 MED ORDER — LIDOCAINE HCL 20 MG/ML (2 %) INJECTION SOLUTION
Freq: Once | INTRAMUSCULAR | Status: DC | PRN
Start: 2017-02-11 — End: 2017-02-11
  Administered 2017-02-11: 5 mL via INTRADERMAL

## 2017-02-11 MED ORDER — HEPARIN (PORCINE) (PF) 1,000 UNIT/500 ML IN 0.9 % SODIUM CHLORIDE IV
INTRAVENOUS | Status: AC
Start: 2017-02-11 — End: 2017-02-11
  Filled 2017-02-11: qty 2000

## 2017-02-11 MED ADMIN — sodium chloride 0.9 % intravenous solution: ORAL | @ 21:00:00 | NDC 00338004904

## 2017-02-11 SURGICAL SUPPLY — 8 items
CATH ART LN SWANGANZ 7FR 110CM_STD 4 LUM TD VOL (Central Venous Lines) ×1
CATH PA 7FR 110CM SWANGANZ 4 LUM TD STD STRL LTX DISP (Central Venous Lines) ×2 IMPLANT
KIT ANGIO NAMIC MTS LHK STRL LF  DISP RNLD MEM HOSPITAL (MISCELLANEOUS PT CARE ITEMS) ×4 IMPLANT
KIT MICROINTRO VSI 40CM 7CM .018IN 4FR 21GA NITINOL SS ECHGN NEEDLE MNDRL STIFFEN DIL SFT TIP (WIRE) ×2 IMPLANT
KIT MICROINTRO VSI 40CM 7CM .0_18IN 4FR 21GA NITINOL SS SFT (WIRE) ×1
MANIFOLD CARIDAC 4V DYE SET (MISCELLANEOUS PT CARE ITEMS) ×2
SHEATH INTROD 10CM 7FR PINN .035IN PERI SS SNAPON DIL LOCK KINK RST SMOOTH TRNS SPRG COIL GW 2.5CM (GUIDING) ×2 IMPLANT
SHEATH INTROD PINNACLE 7FRX10_10/BX RSS701 (GUIDING) ×1

## 2017-02-11 NOTE — Care Plan (Signed)
Carollee HerterShannon is scheduled for a heart cath 02/11/17, prep and clip completed overnight. NPO since midnight. She has continued to complain of sharp pains with deep breathing unrelieved by tylenol, but has had no issues resting peacefully overnight. Fall precautions continue related to recent foot injury, ambulates with steady gait and standby assist, sitter select in place. Ambien scheduled nightly after being given one time doses previously, she has this ordered as a home medication at 10 mg, we are administering 5 mg at this time.

## 2017-02-11 NOTE — Nurses Notes (Signed)
VSS during day shift. Went for right heart cath. No interventions performed. Plan to discharge patient tomorrow. Still c/o continuous CP.

## 2017-02-11 NOTE — Nurses Notes (Signed)
Received pt from 9SE via bed with RN and pt transport. Pt alert and oriebntd. Vital signs per protocol

## 2017-02-11 NOTE — Care Management Notes (Signed)
[facility]  Care Management Note    Patient Name: Isabella AgeeShannon Conrad  Date of Birth: 27-Oct-1965  Sex: female  Date/Time of Admission: 02/08/2017 11:51 PM  Room/Bed: 28/A  Payor: MEDICARE / Plan: MEDICARE PART A AND B / Product Type: Medicare /    LOS: 3 days   Primary Care Providers:  Lorretta HarpPerez, Robert, MD, MD (General)    Admitting Diagnosis:  Pulmonary HTN (CMS [facility]) [I27.20]    Assessment:      02/11/17 1554   Assessment Details   Assessment Type Admission   Date of Care Management Update 02/11/17   Date of Next DCP Update 02/13/17   Care Management Plan   Discharge Planning Status plan in progress   Projected Discharge Date 02/13/17   Discharge Needs Assessment   Discharge Facility/Level of Care Needs Home (Patient/Family Member/other)(code 1)   Transportation Available Medicaid transportation     Per [facility] pt had rt cath today and bridging to warfarin. Pt planning to go home with Lovenox.     Discharge Plan:  Home (Patient/Family Member/other) (code 1)      The patient will continue to be evaluated for developing discharge needs.     Case Manager: Purcell MoutonLauren Phillips  Phone: [phone]

## 2017-02-11 NOTE — Brief Op Note (Signed)
[facility]  Cardiac Catheterization Brief  Note    Name:  Isabella AgeeShannon Conrad  MRN:  [id number]  Date of service:  02/11/2017    Access:  R IJ  Complications:  None    Findings:    1. Severe pulmonary hypertension  2. Normal filling pressure and CO    Recommendations:    1. Care per primary team.    Full note to follow.    Roda ShuttersSujal Hemant Modi, MD, 02/11/2017, 11:22  Fellow, Cardiovascular Disease  [facility]      Late entry for 02/11/17. I saw and examined the patient.  I reviewed the fellow's note.  I agree with the findings and plan of care as documented in the fellow's note.  Any exceptions/additions are edited/noted.      Andrey SpearmanGeorge G. Sokos, DO [facility]

## 2017-02-11 NOTE — Consults (Signed)
Hematology/Oncology Update Note:    We reached out to Dr. Noreene FilbertWaris' office and Ms. Kloc will likely be following up 02/25/17 after final confirmation with her that this timing is good. The office will follow up with her accordingly. Please FAX our Hematology-Medical Oncology consult note copy to Dr. Noreene FilbertWaris' office.    Hazle NordmannHeidar Albandar, MD  02/11/2017, 10:43  Hematology/Oncology Fellow, PGY-4  Department of Medicine  [facility]       Clarene ReamerMiklos Auber, MD   Associate Professor, Section of Hematology/Oncology    [facility] Department of Medicine

## 2017-02-11 NOTE — Nursing Note (Signed)
Obtained information for prior authorization and REMS for Adempas. All information faxed and representative informed. I discussed with the patient the use, dose and side effects of the medication. Patient is aware how to contact me and when to contact me.

## 2017-02-11 NOTE — Nurses Notes (Addendum)
Patient tachycardic in the 110s following prep and clip and ambulation to the bathroom. Resting heart rate < 100 bpm returned 10-15 minutes post prep.

## 2017-02-11 NOTE — Progress Notes (Signed)
[facility]  Advanced Heart Failure & Pulmonary Hypertension Progress Note    Ron AgeeShannon Walker  Date of service: 02/11/2017  Date: 02/11/17   LOS: 3 days    Subjective: Patient is not complaining of pain today when seeing her. She said her RHC will be done this morning and she is hoping she can leave soon. I told her we have to get her a ride home, so it may be tomorrow. She is OK with that.      Date 02/10/17 0700 - 02/11/17 0659 02/11/17 0700 - 02/12/17 0659   Shift 0700-1459 1500-2259 2300-0659 24 Hour Total 0700-1459 1500-2259 2300-0659 24 Hour Total   INTAKE   P.O. 400 100 0 500 0   0     Oral 400 100 0 500 0   0   I.V.(mL/kg/hr) 5(0.01)  20(0.04) 25(0.01)         Med (IV) Flush Volume 5  20 25        Shift Total(mL/kg) 405(5.7) 100(1.41) 20(0.28) 525(7.47) 0(0)   0(0)   OUTPUT   Urine(mL/kg/hr) 1000(1.76) 125(0.22) 300(0.53) 1425(0.84)         Urine (Voided) 1000 [phone]         Urine Occurrence 1 x   1 x 1 x   1 x   Stool             Stool Occurrence 2 x   2 x 1 x   1 x   Shift Total(mL/kg) 1000(14.06) 125(1.76) 300(4.27) 1425(20.27)       Weight (kg) 71.1 71.1 70.3 70.3 70.3 70.3 70.3 70.3       Vitals:  Filed Vitals:    02/11/17 0406 02/11/17 0407 02/11/17 0758 02/11/17 1044   BP: 114/66  113/82 (!) 163/79   Pulse: 89  86 86   Resp: 14  16 (!) 22   Temp: 36.4 C (97.5 F)  36.6 C (97.9 F)    SpO2:  99% 100%      Physical Exam:   Constitutional: No acute distress, well appearing and well nourished. Appears alert, appears stated age and cooperative. Mildly overweight.   Eyes: left eye and right eye without obvious abnormalities, no scleral jaundice or conjunctival erythema noted.   Head: Normocephalic, without obvious abnormality, atraumatic   Neck: Supple, symmetric, trachea midline, no masses. The thyroid appears normal, no thyromegaly. There is no jugular venous distention.   Respiratory: Lungs are diminished to auscultation. No wheezing, rales or rhonchi. No acute distress noted      Cardiovascular: Non-displaced PMI. Regular rhythm, Normal rate. S1, S2 are normal. P2 is of increased intensity consistent with pulmonary hypertension. "There were no murmurs appreciated, no click, rub or gallops noted.   Lower Extremities: 2+ dorsalis pedis pulses bilaterally. She had no cyanosis or clubbing. No lower extremity edema. .   Gastrointestinal: Normal bowel sounds Non-tender, no masses. No hepatomegaly or splenomegaly. No hepatojugular reflux No abdominal bruit noted.   Musculoskeletal: No joint tenderness, obvious deformity or swelling.   Skin: normal coloration and turgor, no rashes, no suspicious skin lesions noted.   Neurologic: No focal deficits ,alert and oriented X 3,Normal coordination and gait.   Genitourinary: was not performed as patient deferred.   Lymphatic: no palpable lymphadenopathy.   Psychiatric: appears in good mood, fluent in speech and cognition.    Labs - Review of Data   CBC Results Differential Results   Recent Results (from the past 30 hour(s))   CBC WITH  DIFF    Collection Time: 02/11/17  4:12 AM   Result Value    WBC 4.1    HGB 10.4 (L)    HCT 31.2 (L)    PLATELETS 233    Recent Results (from the past 30 hour(s))   CBC WITH DIFF    Collection Time: 02/11/17  4:12 AM   Result Value    WBC 4.1    NEUTROPHIL % 44    LYMPHOCYTE % 33    MONOCYTE % 16    EOSINOPHIL % 6    BASOPHIL % 1    BASOPHIL # 0.04      BMP Results Other Chemistries Results   Results for orders placed or performed during the hospital encounter of 02/08/17 (from the past 30 hour(s))   BASIC METABOLIC PANEL    Collection Time: 02/10/17  5:32 AM   Result Value    SODIUM 138    POTASSIUM 4.7    CHLORIDE 102    CO2 TOTAL 27    GLUCOSE 102    BUN 17    CREATININE 0.85    Recent Results (from the past 30 hour(s))   MAGNESIUM    Collection Time: 02/11/17  4:12 AM   Result Value    MAGNESIUM 2.2   PHOSPHORUS    Collection Time: 02/11/17  4:12 AM   Result Value    PHOSPHORUS 3.7      Liver/Pancreas Enzyme  Results Liver Function Results   No results found for this or any previous visit (from the past 30 hour(s)). No results found for this or any previous visit (from the past 30 hour(s)).   Cardiac Results Coags Results   No results found for this or any previous visit (from the past 30 hour(s)). Recent Results (from the past 30 hour(s))   PT/INR    Collection Time: 02/11/17  4:12 AM   Result Value    PROTHROMBIN TIME 23.5 (H)    INR 2.03 (H)            Medications     Current Facility-Administered Medications:  acetaminophen (TYLENOL) tablet 650 mg Oral Q4H PRN   albuterol (PROVENTIL) 2.5 mg / 3 mL (0.083%) neb solution 2.5 mg Nebulization Q4H PRN   clonazePAM (KLONOPIN) tablet 1 mg Oral 2x/day PRN   DULoxetine (CYMBALTA) delayed release capsule 60 mg Oral Daily   ferrous sulfate 324 mg (65 mg elemental IRON) tablet 324 mg Oral 2x/day   NS flush syringe 2 mL Intracatheter Q8HRS   And      NS flush syringe 2-6 mL Intracatheter Q1 MIN PRN   pantoprazole (PROTONIX) delayed release tablet 40 mg Oral 2x/day   warfarin (COUMADIN) tablet 3 mg 3 mg Oral NIGHTLY   zolpidem (AMBIEN) tablet 5 mg Oral NIGHTLY       VTE prophylaxis: Heparin/LMWH, warfarin/other anticoagulant   Ulcer prophylaxis: PPI   Family communication: Patient has no one she wants [place] to contact  Code status: full   Echocardiogram:     Left Ventricle - Normal left ventricular size.There is hyperdynamic left ventricular syst   olic function.LV Ejection Fraction is 83 %.Concentric remodeling.Left ventricular diastol   ic parameters were normal.   Resting Segmental Wall Motion Analysis - Total wall motion score is 1.00. There are no re   gional wall motion abnormalities.   Right Ventricle - Normal right ventricular size.Severely depressed right ventricular syst   olic function.RV systolic pressure is consistent with critical (near systemic) pulmonary  hypertension.   Tricuspid Valve - There is moderate tricuspid regurgitation.       Chest x Ray:  There is no  focal consolidation.  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    Home tomorrow - will need transportation set up    Farrel Gobble APRN, FNP-C  Advanced Heart Failure - Cards Blue      I personally saw and examined the patient on 02/11/17. See Nurse Practitioner's note for additional details. My findings are severe PAH on RHC today. Plan to start Adempas as an outpatient. Will obtain approval. Will plan for discharge to home tomorrow. Mild volume overload on RHC. Will plan for diuresis. Also bridge anticoagulation with lovenox.     Andrey Spearman Sokos, DO

## 2017-02-12 LAB — PT/INR: PROTHROMBIN TIME: 18.8 seconds — ABNORMAL HIGH (ref 9.5–14.1)

## 2017-02-12 LAB — BASIC METABOLIC PANEL
ANION GAP: 8 mmol/L (ref 4–13)
BUN/CREA RATIO: 18 (ref 6–22)
BUN: 13 mg/dL (ref 8–25)
CALCIUM: 9.5 mg/dL (ref 8.5–10.2)
CHLORIDE: 106 mmol/L (ref 96–111)
CO2 TOTAL: 25 mmol/L (ref 22–32)
CREATININE: 0.72 mg/dL (ref 0.49–1.10)
ESTIMATED GFR: >59 mL/min/1.73mˆ2 (ref >59)
GLUCOSE: 96 mg/dL (ref 65–139)
POTASSIUM: 4.2 mmol/L (ref 3.5–5.1)
SODIUM: 139 mmol/L (ref 136–145)

## 2017-02-12 LAB — CBC
HCT: 31.3 % — ABNORMAL LOW (ref 33.5–45.2)
HGB: 10.4 g/dL — ABNORMAL LOW (ref 11.2–15.2)
MCH: 30.4 pg (ref 27.4–33.0)
MCHC: 33.1 g/dL (ref 32.5–35.8)
MCV: 91.7 fL (ref 78.0–100.0)
MPV: 8.9 fL (ref 7.5–11.5)
PLATELETS: 242 x10ˆ3/uL (ref 140–450)
RBC: 3.42 x10ˆ6/uL — ABNORMAL LOW (ref 3.63–4.92)
RDW: 14.8 % (ref 12.0–15.0)
WBC: 4.3 x10ˆ3/uL (ref 3.5–11.0)

## 2017-02-12 LAB — MAGNESIUM: MAGNESIUM: 2.3 mg/dL (ref 1.6–2.5)

## 2017-02-12 MED ORDER — FUROSEMIDE 40 MG TABLET
40.0000 mg | ORAL_TABLET | Freq: Once | ORAL | Status: AC
Start: 2017-02-12 — End: 2017-02-12
  Administered 2017-02-12 (×2): 40 mg via ORAL
  Filled 2017-02-12: qty 1

## 2017-02-12 MED ORDER — WARFARIN 5 MG TABLET: 5 mg | Tab | Freq: Every evening | ORAL | 2 refills | 0 days | Status: AC

## 2017-02-12 MED ORDER — FUROSEMIDE 20 MG TABLET
20.0000 mg | ORAL_TABLET | Freq: Every day | ORAL | Status: DC
Start: 2017-02-13 — End: 2017-02-12

## 2017-02-12 MED ORDER — ENOXAPARIN 80 MG/0.8 ML SUBCUTANEOUS SYRINGE
1.00 mg/kg | INJECTION | Freq: Two times a day (BID) | SUBCUTANEOUS | 0 refills | Status: DC
Start: 2017-02-12 — End: 2017-03-16

## 2017-02-12 MED ORDER — ENOXAPARIN 80 MG/0.8 ML SUBCUTANEOUS SYRINGE
1.00 mg/kg | INJECTION | Freq: Two times a day (BID) | SUBCUTANEOUS | Status: DC
Start: 2017-02-12 — End: 2017-02-12
  Administered 2017-02-12: 70 mg via SUBCUTANEOUS
  Filled 2017-02-12 (×2): qty 0.8

## 2017-02-12 MED ORDER — HEPARIN, PORCINE (PF) 10 UNIT/ML INTRAVENOUS SYRINGE
1.0000 mL | INJECTION | Freq: Once | INTRAVENOUS | Status: AC
Start: 2017-02-12 — End: 2017-02-12
  Administered 2017-02-12: 1 mL
  Filled 2017-02-12: qty 5

## 2017-02-12 MED ORDER — WARFARIN 5 MG TABLET
5.00 mg | ORAL_TABLET | Freq: Every evening | ORAL | Status: DC
Start: 2017-02-12 — End: 2017-02-12
  Filled 2017-02-12: qty 1

## 2017-02-12 MED ORDER — FUROSEMIDE 20 MG TABLET
20.0000 mg | ORAL_TABLET | Freq: Every day | ORAL | 4 refills | Status: DC
Start: 2017-02-13 — End: 2017-03-16

## 2017-02-12 MED ADMIN — ADULT STANDARD CENTRAL 1200 ML PARENTERAL NUTRITION: ORAL | @ 08:00:00 | NDC 00338064406

## 2017-02-12 NOTE — Care Plan (Signed)
Carollee HerterShannon complains of 6/10 chest pain which is an improvement on her baseline. She was concerned about only getting 5mg  of ambien, but slept well overnight. Pt refused assessment at midnight, but allowed 0400 assessment. Heart cath performed 02/11/17 without complication. Planned discharge for 02/12/17. Fall precautions continue, sitter select in use, stand-by assist for weakness related to recent foot injury.

## 2017-02-12 NOTE — Pharmacy (Signed)
Discharge Med Reconciliation/Follow-Up Notes:       Current Discharge Medication List      START taking these medications.      Details   furosemide 20 mg Tablet  Commonly known as:  LASIX  Start taking on:  02/13/2017   20 mg, Oral, DAILY  Qty:  30 Tab  Refills:  4        CONTINUE these medications which have CHANGED during your visit.      Details   enoxaparin 80 mg/0.8 mL Syringe  Commonly known as:  LOVENOX  What changed:     medication strength   how much to take   1 mg/kg, Subcutaneous, EVERY 12 HOURS  Qty:  14 Syringe  Refills:  0     warfarin 5 mg Tablet  Commonly known as:  COUMADIN  What changed:  when to take this   5 mg, Oral, NIGHTLY  Qty:  30 Tab  Refills:  2        CONTINUE these medications - NO CHANGES were made during your visit.      Details   albuterol sulfate 2.5 mg /3 mL (0.083 %) Solution for Nebulization  Commonly known as:  PROVENTIL   2.5 mg, Nebulization, EVERY 4 HOURS PRN  Refills:  0     clonazePAM 1 mg Tablet  Commonly known as:  KLONOPIN   1 mg, Oral, 2 TIMES DAILY  Refills:  0     DULoxetine 60 mg Capsule, Delayed Release(E.C.)  Commonly known as:  CYMBALTA DR   60 mg, Oral, DAILY  Refills:  0     ferrous sulfate 324 mg (65 mg iron) Tablet, Delayed Release (E.C.)  Commonly known as:  FERATAB   324 mg, Oral, 2 TIMES DAILY  Refills:  0     pantoprazole 40 mg Tablet, Delayed Release (E.C.)  Commonly known as:  PROTONIX   40 mg, Oral, 2 TIMES DAILY  Refills:  0     zolpidem 10 mg Tablet  Commonly known as:  AMBIEN   10 mg, Oral, NIGHTLY PRN  Refills:  0          HPI: [age] yof [facility] with dyspnea at rest and exertion, left sided chest pain pleuritic in nature and a history of CTEPH. 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 [facility] when she lived in [place] (we have those records). They began to order adempas, but she says she never took it.  History of noncompliance with coumadin and lovenox. Also of note, patients narc check was ran and she goes to the ED over 7 times this year for pain medication. This was also an issue during the hospital stay. Per the report, she also had narcan used one her once this Fall.    Follow-Up/Notes:  1) Compliance- Patient is non-compliant and was difficult to obtain an accurate medication list on. Rachael Broscious completed a narcotic check and found multiple narcotic rxs from emergency visits.  2) Anticoagulation- Will discharge patient on enoxaparin 70 mg Q 12 hours with warfarin 5 mg daily as she is currently subtherapeutic. Patient is to follow up with her PCP  and labs on Monday. Indication: factor V leiden, factor II Deficiency,and  CTEPH.  3) Oral Iron- History of iron deficiency anemia. There are no current iron studies; recommend obtaining as an outpatient  and re-evaluating.  4) BID PPI- Continuation of home med with pmh of epistaxis.   5) CTEPH- Will start patient on adempas 0.5 mg TID as an outpatient. Isabella Conrad and Isabella Conrad are both aware and are working on PA/insurance needs.

## 2017-02-12 NOTE — Discharge Instructions (Signed)
Things To Do When You Get Home:  . Take all your medications as directed  . Follow a low sodium diet (Stay below 2,000 mg a day)  . Do not drink more than 2 liters of fluid each day (about the same as 1/2 gallon)  . Keep your follow up visit(s) with your cardiologist and/or primary care physician  . Weigh yourself first thing every morning, after using the bathroom and before you eat or drink anything  . Keep track of your weight on sheet provided or a calendar  . Bring you daily weight record to your follow up appointment   . Call the [facility] and [facility] Hypert[facility]  if you gain 2 pounds or more in one day or 5 pounds in a week  . Call the [facility] and Pulmonary Hypertension  if your symptoms occur or get worse:  o Dry hacking cough  o Worsening shortness of breath with activity  o Increase swelling in your legs, feet, ankles  o Increase swelling or tightness in your abdomen   o Waking up short of breath  o Difficulty breathing when lying flat  o Trouble sleeping     Please call the CHF Clinic direct line at [phone] if you are having symptoms listed above or with any other heart failure related questions or concerns.  For your follow up appointment:  o Bring your list of daily weights (and blood pressures and pulse, if you take them regularly).  o Bring all of your prescription medications and over the counter medications in their original bottles  [facility] Hypert[facility] : [phone].

## 2017-02-12 NOTE — Consults (Signed)
[place] NOTE - Education      Isabella Conrad  Date of Service:  02/12/2017  MRN:  [id number]    Recommendations:  2 gm sodium diet, 2 liter fluid restriction, strict I&O, daily weights, follow-up within 3-7 of discharge in the Advanced Heart Failure and [facility]charge [facility], scheduled with Dr. Jannifer Franklin on 02-20-17 at 0900.    Patient is lying in bed, AAOx4.  She is pleasant and engaged in the conversation.  She states that she is very happy with the care she has received here at [facility] and expresses appreciation for the staff.       I reviewed PH/CTEPH along with right heart failure. We also reviewed the benefits of controlling comorbid conditions to help with managing heart failure. We reviewed the importance of self care management and early reporting of onset or worsening symptoms. We discussed the importance of self assessment and early recognition of any onset or worsening PH symptoms. The patient states a willingness to weigh daily and report weight gains of 2 lbs in one day or 5 lbs in a week as well as report any onset or worsening PH symptoms to the HF nurses at the [facility].  She states that she met with Myer Haff yesterday about Adempas and has her contact number in her cell phone asa well Pt does have a scale.    We discussed a 2,000 mg sodium diet as well as the sodium content in a lot of foods including canned and processed foods and she understands reading labels.We also reviewed the benefits and methods of maintaining a 2 liter fluid restriction at home.    The patient verbalizes understanding of self management plan and will call the [facility] with any issues related to heart failure. I left my contact information for any questions or concerns either prior to or after discharge.   I also provided the CHF clinic contact numbers at the [facility] in [place] for any questions or concerns that may develop.    The patient was given the opportunity  to ask questions and all questions were answered to the patient's satisfaction.    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 Discussed the importance of having a close follow-up appointment within 7 days of discharge.    Time Spent Educating Patient:  25 minutes    Lysbeth Penner, RN, BSN, [facility] 02/12/2017, 11:00  Heart Failure Specialty Care Nurse  Ext:  [phone]

## 2017-02-12 NOTE — Nurses Notes (Signed)
Patient refused midnight assessment. Stated to wake her for 0400 assessments.

## 2017-02-12 NOTE — Discharge Summary (Signed)
[facility]  DISCHARGE SUMMARY    PATIENT NAME:  Isabella Conrad, Isabella Conrad  MRN:  [id number]  DOB:  August 31, 1965    ENCOUNTER DATE:  02/08/2017  INPATIENT ADMISSION DATE: 02/08/2017  DISCHARGE DATE:  02/12/2017    ATTENDING PHYSICIAN: Ailene Ravel, DO  SERVICE: CARDIOLOGY BLUE  PRIMARY CARE PHYSICIAN: Lorretta Harp, MD         LAY CAREGIVER:NA    PRIMARY DISCHARGE DIAGNOSIS: Pulmonary hypertension (CMS [facility])  Active Hospital Problems    Diagnosis Date Noted   . Principle Problem: Pulmonary hypertension (CMS [facility]) 02/08/2017   . Pulmonary HTN (CMS [facility]) 02/09/2017      Resolved Hospital Problems   No resolved problems to display.     Active Non-Hospital Problems    Diagnosis Date Noted   . GERD 12/01/2001        DISCHARGE MEDICATIONS:     Current Discharge Medication List      START taking these medications.      Details   furosemide 20 mg Tablet  Commonly known as:  LASIX  Start taking on:  02/13/2017   20 mg, Oral, DAILY  Qty:  30 Tab  Refills:  4        CONTINUE these medications which have CHANGED during your visit.      Details   enoxaparin 80 mg/0.8 mL Syringe  Commonly known as:  LOVENOX  What changed:     medication strength   how much to take   1 mg/kg, Subcutaneous, EVERY 12 HOURS  Qty:  14 Syringe  Refills:  0     warfarin 5 mg Tablet  Commonly known as:  COUMADIN  What changed:  when to take this   5 mg, Oral, NIGHTLY  Qty:  30 Tab  Refills:  2        CONTINUE these medications - NO CHANGES were made during your visit.      Details   albuterol sulfate 2.5 mg /3 mL (0.083 %) Solution for Nebulization  Commonly known as:  PROVENTIL   2.5 mg, Nebulization, EVERY 4 HOURS PRN  Refills:  0     clonazePAM 1 mg Tablet  Commonly known as:  KLONOPIN   1 mg, Oral, 2 TIMES DAILY  Refills:  0     DULoxetine 60 mg Capsule, Delayed Release(E.C.)  Commonly known as:  CYMBALTA DR   60 mg, Oral, DAILY  Refills:  0     ferrous sulfate 324 mg (65 mg iron) Tablet, Delayed Release (E.C.)  Commonly known as:  FERATAB   324 mg, Oral,  2 TIMES DAILY  Refills:  0     pantoprazole 40 mg Tablet, Delayed Release (E.C.)  Commonly known as:  PROTONIX   40 mg, Oral, 2 TIMES DAILY  Refills:  0     zolpidem 10 mg Tablet  Commonly known as:  AMBIEN   10 mg, Oral, NIGHTLY PRN  Refills:  0          Discharge med list refreshed?  YES    ALLERGIES:  Allergies   Allergen Reactions   . Iv Contrast Anaphylaxis   . Hydromorphone      NAUSEA   . Iodinated Contrast- Oral And Iv Dye    . Ketorolac      RASH   . Penicillins      RASH   . Prednisone      ANAPHYLAXIS   . Shellfish [Crab]    . Rocephin [Ceftriaxone] Itching  Pt states needs IV Benadryl prior to administration.        During this hospitalization did the patient have an AMI, PCI/PCTA, STENT or Isolated CABG?  No                  HOSPITAL PROCEDURE(S):   Bedside Procedures:  No orders of the defined types were placed in this encounter.    Surgical Procedure(s):  RIGHT HEART CATH  Card [place] Guided Access    REASON FOR HOSPITALIZATION AND HOSPITAL COURSE     BRIEF HPI:  This is a 52 y.o., female admitted [facility] a [facility] with dyspnea at rest and exertion, left sided chest pain pleuritic in nature and a history of CTEPH. 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 [facility] when she lived in [place] (we have those records). They began to order adempas, but she says she never took it. History of noncompliance with coumadin and lovenox. Also of note, patients narc check was ran and she goes to the ED over 7 times this year for pain medication. This was also an issue during the hospital stay. Per the report, she also had narcan used one her once this Fall.    BRIEF HOSPITAL NARRATIVE: Patient was admitted to Cards blue service for dyspnea. She came in and her INR was subtherapeutic. A RHC was done which revealed an RA of 10 and wedge of 13, PVR was severely elevated as well. per TTE,  she has normal RV size, but depressed RV function, LV is normal sized with EF 83%. She states she has no functional life because she is always so short of breath. [place] her medications. She continued to ask for morphine and norco during stay, after reviewing [facility] patient has severe drug seeking behavior. She only received tylenol once on our service. She was told we will start adempas outpatient and the authorization is already in progress. She will see Dr. Despina Hiddenaccamo 1/11 at [facility] and we will start it then. She was advised she will need to come back up here for regular visits to increase her medication, she said she would. She will be getting INR checked on Monday at her PCP, lab slip included in DC. We will put her on 20mg  lasix daily at home, which is what she was on prior in the past. She will need tylenol, zofran and imodium ordered outpatient for side effects of adempas. She will go home with lovenox to bridge to therapeutic INR.    TRANSITION/POST DISCHARGE CARE/PENDING TESTS/REFERRALS: Please order PAH side effect medications. Follow INR close. Start Adempas. Watch for noncompliance.        CONDITION ON DISCHARGE:  A. Ambulation: Full ambulation  B. Self-care Ability: Complete  C. Cognitive Status Alert  D. Code status at discharge:   Code Status Information     Code Status    Full Code                 LINES/DRAINS/WOUNDS AT DISCHARGE:   Patient Lines/Drains/Airways Status    Active Line / Dialysis Catheter / Dialysis Graft / Drain / Airway / Wound     Name: Placement date: Placement time: Site: Days:    Implantable Port Right Chest  --   --  DISCHARGE DISPOSITION:  Home discharge              DISCHARGE INSTRUCTIONS:  Follow-up Information     [facility] .    Specialty:  Cardiology  Contact information:  [address]  [place] [address]  [phone]  Additional information:  For driving directions to the  [facility] in [place], [place], please call [phone]-CARE [phone]). You may also visit our website at [email or web address].*Valet parking is available to patients at [facility] for free and tipping is not required. *Visitors to our main campus will Radio producer as we are expanding to better serve you. We apologize for any inconvenience this may cause and appreciate your patience.                  PT/INR     PT/INR --  Please select expected date     DISCHARGE INSTRUCTION - WARFARIN (COUMADIN) FOLLOW-UP     Provider to monitor warfarin: PCP    Next INR to be drawn: 02/16/2017      PLEASE KEEP FOLLOW-UP APPT ALREADY SCHEDULED IN CARDIOLOGY-HVI SET    Please keep your appointment with your follow-up provider. If for some reason you need to cancel, be sure to reschedule as this is important for your care.     Department CARDIOLOGY-HVI SET [[id number]]           Farrel Gobble, FNP-C      Copies sent to Care Team       Relationship Specialty Notifications Start End    Lorretta Harp, MD PCP - General EXTERNAL  02/09/17     Phone: [phone] Fax: [phone]         [address] [facility] [address]          Referring providers can utilize [email or web address] to access their referred [facility] patient's information.           Andrey Spearman Sokos, DO [facility]

## 2017-02-12 NOTE — Progress Notes (Signed)
[facility]  Advanced Heart Failure & Pulmonary Hypertension Progress Note    Isabella Conrad  Date of service: 02/12/2017  Date: 02/12/17   LOS: 4 days    Subjective: Patient complained that shes not urinating much. I advised we will bladder scan to make sure there is no residual. Home today, transport at 3PM. denies CP, dizziness. Has continued dyspnea on exertion.    Date 02/11/17 0700 - 02/12/17 0659 02/12/17 0700 - 02/13/17 0659   Shift 0700-1459 1500-2259 2300-0659 24 Hour Total 0700-1459 1500-2259 2300-0659 24 Hour Total   INTAKE   P.O. 0 650 0 650 200   200     Oral 0 650 0 650 200   200   I.V.(mL/kg/hr)   20(0.04) 20(0.01)         Med (IV) Flush Volume   20 20       Shift Total(mL/kg) 0(0) 650(9.25) 20(0.28) 670(9.52) 200(2.84)   200(2.84)   OUTPUT   Urine(mL/kg/hr)  50(0.09)  50(0.03) 250   250     Urine (Voided)  50  50 250   250     Urine Occurrence 1 x   1 x       Stool             Stool Occurrence 1 x   1 x       Shift Total(mL/kg)  50(0.71)  50(0.71) 250(3.55)   250(3.55)   Weight (kg) 70.3 70.3 70.4 70.4 70.4 70.4 70.4 70.4       Vitals:  Filed Vitals:    02/12/17 0000 02/12/17 0400 02/12/17 0401 02/12/17 0803   BP:   (!) 122/59 135/71   Pulse: 87  67 76   Resp:   16 16   Temp:   36.7 C (98.1 F) 36.7 C (98 F)   SpO2:  97%  100%     Physical Exam:   Constitutional: No acute distress, well appearing and well nourished. Appears alert, appears stated age and cooperative. Mildly overweight.   Eyes: left eye and right eye without obvious abnormalities, no scleral jaundice or conjunctival erythema noted.   Head: Normocephalic, without obvious abnormality, atraumatic   Neck: Supple, symmetric, trachea midline, no masses. The thyroid appears normal, no thyromegaly. There is no jugular venous distention.   Respiratory: Lungs are diminished to auscultation. No wheezing, rales or rhonchi. No acute distress noted   Cardiovascular: Non-displaced PMI. Regular rhythm, Normal rate. S1, S2 are  normal. P2 is of increased intensity consistent with pulmonary hypertension. "There were no murmurs appreciated, no click, rub or gallops noted.   Lower Extremities: 2+ dorsalis pedis pulses bilaterally. Shehad no cyanosis or clubbing. No lower extremity edema. .   Gastrointestinal: Normal bowel sounds Non-tender, no masses. No hepatomegaly or splenomegaly. No hepatojugular reflux No abdominal bruit noted.   Musculoskeletal: No joint tenderness, obvious deformity or swelling.   Skin: normal coloration and turgor, no rashes, no suspicious skin lesions noted.   Neurologic: No focal deficits ,alert and oriented X 3,Normal coordination and gait.   Genitourinary: was not performed as patient deferred.   Lymphatic: no palpable lymphadenopathy.   Psychiatric: appears in good mood, fluent in speech and cognition.      Labs - Review of Data   CBC Results Differential Results   Recent Results (from the past 30 hour(s))   CBC WITH DIFF    Collection Time: 02/11/17  4:12 AM   Result Value    WBC 4.1    HGB  10.4 (L)    HCT 31.2 (L)    PLATELETS 233    Recent Results (from the past 30 hour(s))   CBC WITH DIFF    Collection Time: 02/11/17  4:12 AM   Result Value    WBC 4.1    NEUTROPHIL % 44    LYMPHOCYTE % 33    MONOCYTE % 16    EOSINOPHIL % 6    BASOPHIL % 1    BASOPHIL # 0.04      BMP Results Other Chemistries Results   Results for orders placed or performed during the hospital encounter of 02/08/17 (from the past 30 hour(s))   BASIC METABOLIC PANEL    Collection Time: 02/12/17  4:06 AM   Result Value    SODIUM 139    POTASSIUM 4.2    CHLORIDE 106    CO2 TOTAL 25    GLUCOSE 96    BUN 13    CREATININE 0.72    Recent Results (from the past 30 hour(s))   MAGNESIUM    Collection Time: 02/12/17  4:06 AM   Result Value    MAGNESIUM 2.3   PHOSPHORUS    Collection Time: 02/11/17  4:12 AM   Result Value    PHOSPHORUS 3.7      Liver/Pancreas Enzyme Results Liver Function Results   No results found for this or any previous  visit (from the past 30 hour(s)). No results found for this or any previous visit (from the past 30 hour(s)).   Cardiac Results Coags Results   No results found for this or any previous visit (from the past 30 hour(s)). Recent Results (from the past 30 hour(s))   PT/INR    Collection Time: 02/12/17  4:06 AM   Result Value    PROTHROMBIN TIME 18.8 (H)    INR 1.62 (H)            Medications     Current Facility-Administered Medications:  acetaminophen (TYLENOL) tablet 650 mg Oral Q4H PRN   albuterol (PROVENTIL) 2.5 mg / 3 mL (0.083%) neb solution 2.5 mg Nebulization Q4H PRN   clonazePAM (KLONOPIN) tablet 1 mg Oral 2x/day PRN   DULoxetine (CYMBALTA) delayed release capsule 60 mg Oral Daily   enoxaparin PF (LOVENOX) 80 mg/0.8 mL SubQ injection 1 mg/kg Subcutaneous Q12H   ferrous sulfate 324 mg (65 mg elemental IRON) tablet 324 mg Oral 2x/day   NS flush syringe 2 mL Intracatheter Q8HRS   And      NS flush syringe 2-6 mL Intracatheter Q1 MIN PRN   pantoprazole (PROTONIX) delayed release tablet 40 mg Oral 2x/day   warfarin (COUMADIN) tablet 5 mg Oral NIGHTLY   zolpidem (AMBIEN) tablet 5 mg Oral NIGHTLY       VTE prophylaxis: Heparin/LMWH, warfarin/other anticoagulant   Ulcer prophylaxis: PPI   Family communication: Patient has no one she wants us to contact  Code status:full   Echocardiogram:    Left Ventricle - Normal left ventricular size.There is hyperdynamic left ventricular syst   olic function.LV Ejection Fraction is 83 %.Concentric remodeling.Left ventricular diastol   ic parameters were normal.   Resting Segmental Wall Motion Analysis - Total wall motion score is 1.00. There are no re   gional wall motion abnormalities.   Right Ventricle - Normal right ventricular size.Severely depressed right ventricular syst   olic function.RV systolic pressure is consistent with critical (near systemic) pulmonary    hypertension.   Tricuspid Valve - There is moderate tricuspid regurgitation.  Chest x Ray:  There is no  focal consolidation.  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    Home today - will start adempas outpatient. auth in progress    Farrel Gobble APRN, FNP-C  Advanced Heart Failure - Cards Blue      I personally saw and examined the patient on 02/12/17. See Nurse Practitioner's note for additional details. My findings are euvolemic. Ok to discharge to home on oral meds. Plan for adempas as outpatient. lovenox bridge    Andrey Spearman Sokos, DO

## 2017-02-12 NOTE — Pharmacy (Signed)
[facility]  Therapeutic Drug Monitoring: Warfarin Progress Note    Isabella AgeeShannon Conrad is a 52 y.o. year old female on warfarin for CTEPH with a history of multiple pulmonary embolisms.      Goal INR: 2-3    Home dose: Warfarin 5 mg however suspect some aspect of noncompliance     Outpatient INR Management: TBD    Inpatient Management: cardiology blue    Concurrent Antiplatelet Agents: None    Inpatient Warfarin Dosing:     Date INR Warfarin Dose   (@ 21:00) Bridging Regimen Drug Interactions Comments   12/31 1.78 5 mg Enoxaparin 70 mg q12h Duloxetine     01/01 2.13 3 mg Enoxparin 70 mg q12h Duloxetine    01/02 2.03 3 mg      01/03 1.62 (5 mg) Enox 70 mg Q 12               Assessment/Plan:  Isabella Conrad 's INR today is sub-therapeutic.    Pharmacist recommendation: Would recommend restarting enoxaparin as INR is sub- therapeutic. Dose difficult to discern due to history of noncompliance. Will continue monitor daily and increase warfarin to 5 mg daily.    Monitoring:  Please continue to monitor PT/INR daily.  Monitor for signs/symptoms of bleeding.   Pharmacist will continue to make daily warfarin recommendations.   Please contact pharmacy with any questions.

## 2017-02-12 NOTE — Care Management Notes (Addendum)
Per TBR pt will be d/c ready today in the late afternoon. MSW tasked to have Medicaid transport set up for pt around 3pm. MSW sent task to check on Lovenox.     UPDATE: Transport arranged with [employer] at 1500.      Purcell MoutonLauren Phillips  02/12/2017, 08:43

## 2017-02-12 NOTE — Care Management Notes (Signed)
[facility]  Care Management Note    Patient Name: Isabella AgeeShannon Conrad  Date of Birth: 04-25-1965  Sex: female  Date/Time of Admission: 02/08/2017 11:51 PM  Room/Bed: 28/A  Payor: MEDICARE / Plan: MEDICARE PART A AND B / Product Type: Medicare /    LOS: 4 days   Primary Care Providers:  Lorretta HarpPerez, Robert, MD, MD (General)    Admitting Diagnosis:  Pulmonary HTN ([facility]) [I27.20]    Assessment:   MSW advised that the pt would be started on Adempas as an outpatient.  MSW submitted PA for Adempas .5mg  to [employer] and medication has been approved until 02/09/2018. [id number].  MSW contacted [employer] @ [phone] to ensure that all doses were approved.  MSW spoke to [place] who stated that in her system, the pt is approved up to 2.5 mg.  PA approval form faxed to [employer] and submitted to scanned into the pt's chart.      The patient will continue to be evaluated for developing discharge needs.     Case Manager: Truitt LeepJohnnette Jasper  Phone: [phone]

## 2017-02-13 LAB — BENZODIAZEPINES CONFIRMATION, URINE
7-NH-CLONAZEPAM-BY GC/MS: 725 ng/mL
ALPHA OH-TRIAZOLAM-BY GC/MS: NEGATIVE ng/mL
INTERPRETATION: POSITIVE
LORAZEPAM-BY GC/MS: NEGATIVE ng/mL
NORDIAZEPAM-BY GC/MS: NEGATIVE ng/mL
OXAZEPAM-BY GC/MS: NEGATIVE ng/mL

## 2017-02-15 DIAGNOSIS — Z79899 Other long term (current) drug therapy: Secondary | ICD-10-CM

## 2017-02-15 DIAGNOSIS — Z86711 Personal history of pulmonary embolism: Secondary | ICD-10-CM

## 2017-02-15 DIAGNOSIS — J9611 Chronic respiratory failure with hypoxia: Secondary | ICD-10-CM

## 2017-02-15 DIAGNOSIS — I272 Pulmonary hypertension, unspecified: Secondary | ICD-10-CM

## 2017-02-15 DIAGNOSIS — Z9981 Dependence on supplemental oxygen: Secondary | ICD-10-CM

## 2017-02-15 DIAGNOSIS — Z7901 Long term (current) use of anticoagulants: Secondary | ICD-10-CM

## 2017-02-15 LAB — OPIATE CONFIRMATION, URINE
CODEINE-BY LC-MS/MS: NEGATIVE ng/mL
DIHYDROCODEINE-BY LC-MS/MS: 85 ng/mL
HYDROCODONE-BY LC-MS/MS: 456 ng/mL
HYDROMORPHONE-BY LC-MS/MS: 72 ng/mL
MORPHINE-BY LC-MS/MS: 18511 ng/mL
NORHYDROCODONE-BY LC-MS/MS: 515 ng/mL
NOROXYCODONE-BY LC-MS/MS: NEGATIVE ng/mL
NOROXYMORPHONE-BY LC-MS/MS: NEGATIVE ng/mL
OPIATES INTERPRETATION: POSITIVE
OXYCODONE-BY LC-MS/MS: NEGATIVE ng/mL
OXYMORPHONE-BY LC-MS/MS: NEGATIVE ng/mL

## 2017-02-20 ENCOUNTER — Encounter (HOSPITAL_COMMUNITY): Payer: Self-pay | Admitting: Cardiovascular Disease

## 2017-03-04 DIAGNOSIS — I2724 Chronic thromboembolic pulmonary hypertension: Secondary | ICD-10-CM

## 2017-03-04 DIAGNOSIS — R079 Chest pain, unspecified: Secondary | ICD-10-CM

## 2017-03-16 ENCOUNTER — Telehealth (HOSPITAL_COMMUNITY): Payer: Self-pay

## 2017-03-16 ENCOUNTER — Encounter (HOSPITAL_COMMUNITY): Payer: Self-pay | Admitting: Cardiovascular Disease

## 2017-03-16 ENCOUNTER — Ambulatory Visit (INDEPENDENT_AMBULATORY_CARE_PROVIDER_SITE_OTHER): Payer: Medicare Other | Admitting: Rheumatology

## 2017-03-16 ENCOUNTER — Ambulatory Visit: Payer: Medicare Other | Attending: Cardiovascular Disease | Admitting: Cardiovascular Disease

## 2017-03-16 VITALS — BP 140/84 | HR 119 | Temp 97.0°F | Resp 18 | Ht 64.5 in | Wt 157.4 lb

## 2017-03-16 DIAGNOSIS — Z9119 Patient's noncompliance with other medical treatment and regimen: Secondary | ICD-10-CM | POA: Insufficient documentation

## 2017-03-16 DIAGNOSIS — N200 Calculus of kidney: Secondary | ICD-10-CM | POA: Insufficient documentation

## 2017-03-16 DIAGNOSIS — Z9889 Other specified postprocedural states: Secondary | ICD-10-CM | POA: Insufficient documentation

## 2017-03-16 DIAGNOSIS — F419 Anxiety disorder, unspecified: Secondary | ICD-10-CM

## 2017-03-16 DIAGNOSIS — K219 Gastro-esophageal reflux disease without esophagitis: Secondary | ICD-10-CM

## 2017-03-16 DIAGNOSIS — D682 Hereditary deficiency of other clotting factors: Secondary | ICD-10-CM | POA: Insufficient documentation

## 2017-03-16 DIAGNOSIS — R0902 Hypoxemia: Secondary | ICD-10-CM | POA: Insufficient documentation

## 2017-03-16 DIAGNOSIS — Z7901 Long term (current) use of anticoagulants: Secondary | ICD-10-CM | POA: Insufficient documentation

## 2017-03-16 DIAGNOSIS — D649 Anemia, unspecified: Secondary | ICD-10-CM | POA: Insufficient documentation

## 2017-03-16 DIAGNOSIS — Z79899 Other long term (current) drug therapy: Secondary | ICD-10-CM | POA: Insufficient documentation

## 2017-03-16 DIAGNOSIS — I2724 Chronic thromboembolic pulmonary hypertension: Secondary | ICD-10-CM

## 2017-03-16 DIAGNOSIS — I272 Pulmonary hypertension, unspecified: Secondary | ICD-10-CM

## 2017-03-16 DIAGNOSIS — Z86711 Personal history of pulmonary embolism: Secondary | ICD-10-CM | POA: Insufficient documentation

## 2017-03-16 DIAGNOSIS — Z885 Allergy status to narcotic agent status: Secondary | ICD-10-CM | POA: Insufficient documentation

## 2017-03-16 DIAGNOSIS — G43909 Migraine, unspecified, not intractable, without status migrainosus: Secondary | ICD-10-CM | POA: Insufficient documentation

## 2017-03-16 DIAGNOSIS — Z1159 Encounter for screening for other viral diseases: Secondary | ICD-10-CM | POA: Insufficient documentation

## 2017-03-16 DIAGNOSIS — F329 Major depressive disorder, single episode, unspecified: Secondary | ICD-10-CM | POA: Insufficient documentation

## 2017-03-16 DIAGNOSIS — Z91013 Allergy to seafood: Secondary | ICD-10-CM | POA: Insufficient documentation

## 2017-03-16 DIAGNOSIS — Z114 Encounter for screening for human immunodeficiency virus [HIV]: Secondary | ICD-10-CM | POA: Insufficient documentation

## 2017-03-16 DIAGNOSIS — R0602 Shortness of breath: Secondary | ICD-10-CM

## 2017-03-16 DIAGNOSIS — G4700 Insomnia, unspecified: Secondary | ICD-10-CM | POA: Insufficient documentation

## 2017-03-16 DIAGNOSIS — I519 Heart disease, unspecified: Secondary | ICD-10-CM | POA: Insufficient documentation

## 2017-03-16 DIAGNOSIS — I50812 Chronic right heart failure: Secondary | ICD-10-CM

## 2017-03-16 DIAGNOSIS — Z880 Allergy status to penicillin: Secondary | ICD-10-CM | POA: Insufficient documentation

## 2017-03-16 DIAGNOSIS — Z91041 Radiographic dye allergy status: Secondary | ICD-10-CM | POA: Insufficient documentation

## 2017-03-16 DIAGNOSIS — G8929 Other chronic pain: Secondary | ICD-10-CM | POA: Insufficient documentation

## 2017-03-16 DIAGNOSIS — Z888 Allergy status to other drugs, medicaments and biological substances status: Secondary | ICD-10-CM | POA: Insufficient documentation

## 2017-03-16 DIAGNOSIS — D6851 Activated protein C resistance: Secondary | ICD-10-CM | POA: Insufficient documentation

## 2017-03-16 LAB — COMPREHENSIVE METABOLIC PANEL, NON-FASTING
ALBUMIN: 4.2 g/dL (ref 3.5–5.0)
ALKALINE PHOSPHATASE: 122 U/L (ref <150)
ALT (SGPT): 16 U/L (ref <55)
ANION GAP: 13 mmol/L (ref 4–13)
AST (SGOT): 19 U/L (ref 8–41)
AST (SGOT): 19 U/L (ref 8–41)
BILIRUBIN TOTAL: 0.4 mg/dL (ref 0.3–1.3)
BUN/CREA RATIO: 13 (ref 6–22)
BUN: 11 mg/dL (ref 8–25)
CALCIUM: 9.9 mg/dL (ref 8.5–10.2)
CHLORIDE: 113 mmol/L — ABNORMAL HIGH (ref 96–111)
CO2 TOTAL: 18 mmol/L — ABNORMAL LOW (ref 22–32)
CREATININE: 0.87 mg/dL (ref 0.49–1.10)
ESTIMATED GFR: >59 mL/min/{1.73_m2} (ref >59)
GLUCOSE: 109 mg/dL (ref 65–139)
POTASSIUM: 3.5 mmol/L (ref 3.5–5.1)
PROTEIN TOTAL: 7.3 g/dL (ref 6.4–8.3)
PROTEIN TOTAL: 7.3 g/dL (ref 6.4–8.3)
SODIUM: 144 mmol/L (ref 136–145)

## 2017-03-16 LAB — IRON TRANSFERRIN AND TIBC
IRON (TRANSFERRIN) SATURATION: 25 % (ref 16–50)
IRON: 120 ug/dL (ref 45–170)
TRANSFERRIN: 341 mg/dL (ref 180–360)

## 2017-03-16 LAB — DRUG SCREEN, WITH CONFIRMATION, URINE
BENZODIAZEPINES URINE: NEGATIVE
BUPRENORPHINE URINE: NEGATIVE
CANNABINOIDS URINE: NEGATIVE
CREATININE RANDOM URINE: 204 mg/dL
METHADONE URINE: NEGATIVE
OPIATES URINE (LOW CUTOFF): NEGATIVE
OXIDANT-ADULTERATION: NEGATIVE
OXYCODONE URINE: NEGATIVE
PH-ADULTERATION: 6.2 (ref 4.5–9.0)

## 2017-03-16 LAB — B-TYPE NATRIURETIC PEPTIDE: BNP: 485 pg/mL — ABNORMAL HIGH (ref <=100)

## 2017-03-16 LAB — HIV1/HIV2 SCREEN, COMBINED ANTIGEN AND ANTIBODY: HIV SCREEN, COMBINED ANTIGEN & ANTIBODY: NEGATIVE

## 2017-03-16 LAB — FERRITIN
FERRITIN: 771 ng/mL — ABNORMAL HIGH (ref 10–200)
FERRITIN: 771 ng/mL — ABNORMAL HIGH (ref 10–200)

## 2017-03-16 LAB — MAGNESIUM: MAGNESIUM: 2.2 mg/dL (ref 1.6–2.5)

## 2017-03-16 NOTE — Patient Instructions (Addendum)
1. Labs today  2. RTC 2 weeks with APP  3. 6MWT next clinic visit   4. Call Judy at [phone] with questions   5. [facility]

## 2017-03-16 NOTE — Progress Notes (Signed)
Reason For Visit:  Here for follow up for pulmonary hypertension.    History of Present Illness:     Dear Dr. Carlynn PurlPerez, Isabella Conrad       Isabella Conrad is a 52 y.o. female here for follow up of CTEPH. She has been seen by my colleagues while hospitalized, but this is my first time meeting and seeing her in our office. She has a past medical history of FVL mutation, Factor II deficiency, multiple bilateral PE's, IVC filter placement 2006, nephrolithiasis, migraines, chronic pain issues as well as some hx of non-compliance.     She was originally admitted 02/08/15 with chest pain and dyspnea with VQ consistent with chronic thromboembolic disease. TTE at that time showed evidence of PH with RV strain. Of note, she was anticoagulated with Coumadin prior to that admission and her INR was subtherapeutic. She was ultimately switched to Xarelto after discussion with Hematology. [facility] underwent pulmonary thromboendarterectomy on 02/13/15 for CTEPH. She had a partial response but with significant reduction in her PA pressures. Her surgery was performed by Dr. Kennon RoundsHaney. She currently follows with Dr. Ferd Glassingahhan for CTEPH.    Since the time of her surgery she has presented multiple times to multiple different hospitals with chest pain and/or shortness of breath with imaging noting acute PE's. Of note, some of these times she was noted to be non-compliant with her anticoagulation. [facility] showed a PA mean of 36. Dr. Ferd Glassingahhan wanted to start Adempas but she has refused over concern of potential lowering of her blood pressure. She is currently maintained on coumadin.     Prior ER notes indicate some recurrent drug seeking behavior. She was 2 hours late to clinic today.     She was admitted to our hospital and had a repeat RHC that revealed severe PH. She has had several ER visits since then. [facility] that [facility] chronic CTEPH.       Since her last visit, she reports  stable symptoms.  She still gets shortness of breath with more than moderate exertion. She feels dyspneic on walking one block. She can climb one flight of stairs .    At today's office visit, she denies any other new or worsening cardiac symptoms such as chest pain, palpitations, exertional lightheadedness, syncope, paroxysmal nocturnal dyspnea, orthopnea, or lower extremity edema.     She denies nausea, vomiting, early satiety or abdominal bloating. She has not had hemoptysis, tinnitus or epistaxis.     She has been mostly compliant with her medications, and has been poorly compliant with sodium and fluid restriction. She denies NSAIDs use.    She denies weight change since the last visit.      Patient Active Problem List    Diagnosis Date Noted   . Pulmonary HTN (CMS HCC) 02/09/2017   . Pulmonary hypertension (CMS HCC) 02/08/2017     Added automatically from request for surgery [phone]     . GERD 12/01/2001       Allergies   Allergen Reactions   . Iv Contrast Anaphylaxis   . Hydromorphone      NAUSEA   . Iodinated Contrast- Oral And Iv Dye    . Ketorolac      RASH   . Penicillins      RASH   . Prednisone      ANAPHYLAXIS   . Shellfish [Crab]    . Rocephin [Ceftriaxone] Itching  Pt states needs IV Benadryl prior to administration.        Medications:    Current Outpatient Medications   Medication Sig Dispense Refill   . albuterol sulfate (PROVENTIL) 2.5 mg /3 mL (0.083 %) Inhalation Solution for Nebulization 2.5 mg by Nebulization route Every 4 hours as needed for Wheezing     . clonazePAM (KLONOPIN) 1 mg Oral Tablet Take 1 mg by mouth Twice daily     . DULoxetine (CYMBALTA DR) 60 mg Oral Capsule, Delayed Release(E.C.) Take 60 mg by mouth Once a day     . ferrous sulfate (FERATAB) 324 mg (65 mg iron) Oral Tablet, Delayed Release (E.C.) Take 324 mg by mouth Twice daily     . pantoprazole (PROTONIX) 40 mg Oral Tablet, Delayed Release (E.C.) Take 40 mg by mouth Twice daily     . warfarin (COUMADIN) 5 mg Oral Tablet  Take 1 Tab (5 mg total) by mouth Every night 30 Tab 2   . zolpidem (AMBIEN) 10 mg Oral Tablet Take 10 mg by mouth Every night as needed for Insomnia       No current facility-administered medications for this visit.        I have comprehensively reviewed the records of their past medical history, family history, social history, medication history and review of systems, and they are unchanged since the last visit.    Review of Systems:     A full review of systems was asked and all came back negative except what is mentioned in the history of presenting illness.      Constitutional: denies anorexia, reports fatigue, denies fevers, denies chills, night sweats, denies weight loss, denies weight gain.  Eyes: denies blurry vision, double vision, or eye pain.  ENT: denies difficulty swallowing, epistaxis, nasal discharge, oral lesions, tinnitus, or vocal changes.  Cardiovascular: reports chest pain, chest pressure/discomfort, denies claudication, irregular heartbeat, denies lower extremity edema, denies near-syncope, denies orthopnea, denies palpitations, denies syncope.  Respiratory: denies cough, reports dyspnea on exertion, denies emphysema, pleurisy/chest pain, sputum and wheezing.  Gastrointestinal: denies abdominal pain, constipation, diarrhea, jaundice, melena, nausea, reflux symptoms, or vomiting.  Musculoskeletal: denies arthralgias, denies generalized muscle aches.   Neurological: denies dizziness, denies headache, denies gait problems, memory problems, speech problems, tremors, vertigo, or weakness.   Endocrine: denies hot flashes, mood swings, skin changes, temperature intolerance, or unexpected weight changes, denies change in appetite, denies sweating.   Hematologic/Lymphatic: denies bleeding problems, denies easy bruising.   Allergic/Immunological: denies hives, insect bite sensitivity, or nasal congestion.  Dermatological: denies acne, eczema, lumps, rash, or skin lesion changes.  Genitourinary: denies any  urinary urgency, denies incontinence, denies blood in urine.   Psychiatric: reports anxiety, reports depression.    BP (!) 140/84   Pulse (!) 119   Temp 36.1 C (97 F)   Resp 18   Ht 1.638 m (5' 4.5")   Wt 71.4 kg (157 lb 6.5 oz)   SpO2 96%   BMI 26.60 kg/m       Wt Readings from Last 3 Encounters:   03/16/17 71.4 kg (157 lb 6.5 oz)   02/12/17 70.4 kg (155 lb 3.3 oz)       Physical Exam:  Constitutional: No acute distress, well appearing and well nourished. Appears alert, appears stated age and cooperative. Not overweight.  Eyes: Pupils equal and reactive, extra ocular eye movements intact, left eye normal, right eye normal.  Head: Normocephalic, without obvious abnormality, atraumatic.  Neck: Supple, symmetric, trachea midline, no  masses. The thyroid appears normal, no thyromegaly. There is no jugular venous distention. JVP is normal at 8 cm H20 with normal venous contours. There are no carotid bruits, normal upstroke of bilateral carotid arteries.   Respiratory: Lungs are clear to auscultation. No wheezing, rales, or rhonchi. Cardiovascular: Non-displaced PMI. regular rhythm, normal rate. S1, S2 are normal. P2 is of incrreased intensity consistent with pulmonary hypertension. II/VI systolic murmur at left lower sternal border, no click, rub or gallops noted.  Lower Extremities: 4+ dorsalis pedis, posterior tibial, femoral and popliteal pulses bilaterally with no evidence of any abdominal bruits. She had no cyanosis or clubbing. No lower extremity edema.   Gastrointestinal: Normal bowel sounds  Non-tender, no masses.  No hepatomegaly or splenomegaly. No hepatojugular reflux.  Musculoskeletal: No joint tenderness, deformity or swelling.  Skin: Normal coloration and turgor, no rashes, no suspicious skin lesions noted.   Neurologic: No focal deficits, alert and oriented x3, normal strength and tone. Normal symmetric reflexes. Normal coordination and gait.  Genitourinary: Was not performed as patient deferred.    Lymphatic: No palpable lymphadenopathy.   Psychiatric: Appears in good mood, fluent in speech and cognition.       LABORATORY:  @BRIEFLABTABLE (Na:2,K:2,CO2:2,BUN:2,Creatinine:2,WBC:2,Hgb:2,Plt:2)@  @BRIEFLABTABLE (AST:2,ALT:2,Bil:2,Alkphosp:2)@  @BRIEFLABTABLE (chol:2,LDL:2,Trig:2,HDL:2)@  @BRIEFLABTABLE (TSH:2)@  No results found for: URICACID, TROPONIN, NTPROBNP        Discussion:  Ms. Cataldi is a 52 yo with CTEPH s/p surgery with residual disease and severe PAH with hx of noncompliance. Although she is compensated currently her long-term prognosis is poor. We will make sure she is compliant before starting adempas. I am concerned that she will not get monthly pregnancy testing for adempas REMS. I educated her too that the meds is her only option. If she is interested in repeat surgery, I encouraged her to be re-evaluated at [facility]. Will get [facility] records too. She signed the release for [facility] records. I am concerned about her drug seeking behavior, will check UDS today.         Assessment:  1. 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  2. Severe right ventricular dysfunction  3. Clinically euvolemic on examination  4. WHO functional class II/III  5. Factor 5, hypercoagulable  6. Hx of noncompliance  7. Pain seeking behaviors on prior notes  8. Hypoxia       Plan:   1. Labs today  2. RTC 2 weeks with APP  3. next clinic visit   4. Call Judy at [phone] with questions   5. Get records from [facility]       I plan on seeing the patient back in the office in 2 weeks with repeat blood work prior to their next visit. Of course I would be happy to see her sooner if there are issues that develop in the interim.     Thank you for allowing me to participate in the care of your patient.  If you have any questions please do not hesitate to call.     Sincerely,    John Giovanni, DO  Advanced Heart Failure   [facility]

## 2017-03-31 ENCOUNTER — Encounter (HOSPITAL_COMMUNITY): Payer: Self-pay | Admitting: Family

## 2017-03-31 ENCOUNTER — Ambulatory Visit: Payer: Medicare Other | Attending: Family | Admitting: Family

## 2017-03-31 VITALS — BP 129/69 | HR 109 | Temp 96.8°F | Resp 20 | Ht 64.5 in | Wt 162.3 lb

## 2017-03-31 DIAGNOSIS — I272 Pulmonary hypertension, unspecified: Secondary | ICD-10-CM | POA: Insufficient documentation

## 2017-03-31 DIAGNOSIS — Z79899 Other long term (current) drug therapy: Secondary | ICD-10-CM | POA: Insufficient documentation

## 2017-03-31 DIAGNOSIS — I50810 Right heart failure, unspecified: Secondary | ICD-10-CM

## 2017-03-31 LAB — DRUG SCREEN, WITH CONFIRMATION, URINE
AMPHETAMINES URINE: NEGATIVE
BARBITURATES URINE: NEGATIVE
BUPRENORPHINE URINE: NEGATIVE
CANNABINOIDS URINE: NEGATIVE
COCAINE METABOLITES URINE: NEGATIVE
COCAINE METABOLITES URINE: NEGATIVE
CREATININE RANDOM URINE: 257 mg/dL
METHADONE URINE: NEGATIVE
OPIATES URINE (LOW CUTOFF): POSITIVE — AB
OXYCODONE URINE: NEGATIVE
OXYCODONE URINE: NEGATIVE
PH-ADULTERATION: 5.8 (ref 4.5–9.0)

## 2017-03-31 MED ORDER — FUROSEMIDE 20 MG TABLET
20.0000 mg | ORAL_TABLET | Freq: Every day | ORAL | 5 refills | Status: AC
Start: 2017-03-31 — End: ?

## 2017-03-31 NOTE — Progress Notes (Signed)
Reason For Visit: Follow up visit for congestive heart failure.    History of Present Illness:     Dear Dr. Carlynn Purl, Molly Maduro     As you know Isabella Conrad is a 52 y.o. female who has history of CTEPH. 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 She had a thromboectomy done in 2017 at [facility]. She has been seen once in our clinic by Dr. Despina Hidden and prior to starting adempas, he wants to make sure she is complaint with office visits, medications and drug free.Marland Kitchen    She was admitted to the hospital in December 2018 as a transfer from [facility]. While inpatient, A RHC was done which revealed an RA of 10 and wedge of 13, PVR was severely elevated as well. per TTE, she has normal RV size, but depressed RV function, LV is normal sized with EF 83%. She states she has no functional life because she is always so short of breath. She goes to a PCP in [place] and he manages her medications. She continued to ask for morphine and norco during stay, after reviewing [place] [facility] patient has severe drug seeking behavior.     Today, she states she has been to an ER twice since her discharge for "chest pain". She states that today she will have norco in her system since she was at the ER yesterday. Today, she does not show any signs of pain or complain of pain. She states she feels like she has more fluid on her than normal. She has slight JVD and +1 BLE edema. She states that an ER doctor stopped her lasix, not [place]. At today's office visit, she denies any other new or worsening cardiac symptoms such as worsening shortness of breath, palpitations, lightheadedness or syncope.      Patient Active Problem List    Diagnosis Date Noted   . Pulmonary HTN (CMS HCC) 02/09/2017   . Pulmonary hypertension (CMS HCC) 02/08/2017     Added automatically from request for surgery [phone]     . GERD 12/01/2001       Allergies   Allergen Reactions   . Iv  Contrast Anaphylaxis   . Hydromorphone      NAUSEA   . Iodinated Contrast- Oral And Iv Dye    . Ketorolac      RASH   . Penicillins      RASH   . Prednisone      ANAPHYLAXIS   . Shellfish [Crab]    . Rocephin [Ceftriaxone] Itching     Pt states needs IV Benadryl prior to administration.        Medications:    Current Outpatient Medications   Medication Sig Dispense Refill   . albuterol sulfate (PROVENTIL) 2.5 mg /3 mL (0.083 %) Inhalation Solution for Nebulization 2.5 mg by Nebulization route Every 4 hours as needed for Wheezing     . clonazePAM (KLONOPIN) 1 mg Oral Tablet Take 1 mg by mouth Twice daily     . DULoxetine (CYMBALTA DR) 60 mg Oral Capsule, Delayed Release(E.C.) Take 60 mg by mouth Once a day     . ferrous sulfate (FERATAB) 324 mg (65 mg iron) Oral Tablet, Delayed Release (E.C.) Take 324 mg by mouth Twice daily     . furosemide (LASIX) 20 mg Oral Tablet Take 1 Tab (20 mg total) by mouth Once a day 30 Tab 5   .  pantoprazole (PROTONIX) 40 mg Oral Tablet, Delayed Release (E.C.) Take 40 mg by mouth Twice daily     . warfarin (COUMADIN) 5 mg Oral Tablet Take 1 Tab (5 mg total) by mouth Every night 30 Tab 2   . zolpidem (AMBIEN) 10 mg Oral Tablet Take 10 mg by mouth Every night as needed for Insomnia       No current facility-administered medications for this visit.        Heart failure medications:    ACE-i/ARB: no.  Beta blocker: no  Aldosterone antagonist:no  Device: no  Device type: Not indicated    I have comprehensively reviewed the records of their past medical history, family history, social history, medication history and review of systems, and they are unchanged since the last visit.    Review of Systems:     A full review of systems was asked and all came back negative except what is mentioned in the history of presenting illness.      Constitutional: denies anorexia, denies fatigue, denies fevers, denies chills, night sweats, denies weight loss, denies weight gain.  Eyes: denies blurry vision,  double vision, or eye pain.  ENT: denies difficulty swallowing, epistaxis, nasal discharge, oral lesions, tinnitus, or vocal changes.  Cardiovascular: reports chest pain, chest pressure/discomfort, denies claudication, irregular heartbeat, reports lower extremity edema, denies near-syncope, reports orthopnea, denies palpitations, denies syncope.  Respiratory: denies cough, reports dyspnea on exertion, denies emphysema, pleurisy/chest pain, sputum, or wheezing.   Gastrointestinal: denies abdominal pain, constipation, diarrhea, jaundice, melena, nausea, reflux symptoms, or vomiting  Musculoskeletal: denies arthralgias, denies generalized muscle aches.  Neurological: denies dizziness, denies headache, denies gait problems, memory problems, speech problems, tremors, vertigo, or weakness.   Endocrine: denies hot flashes, mood swings, skin changes, temperature intolerance, or unexpected weight changes, denies change in appetite, denies sweating.   Hematologic/Lymphatic: denies bleeding problems or easy bruising.   Allergic/Immunological: denies hives, insect bite sensitivity, or nasal congestion.  Dermatological: denies acne, denies eczema, lumps, rash, or skin lesion changes.  Genitourinary: denies any urinary urgency, denies incontinence, denies blood in urine.   Psychiatric: denies anxiety, denies depression.    BP 129/69   Pulse (!) 109   Temp 36 C (96.8 F)   Resp 20   Ht 1.638 m (5' 4.5")   Wt 73.6 kg (162 lb 4.1 oz)   SpO2 94%   BMI 27.42 kg/m         Wt Readings from Last 3 Encounters:   03/31/17 73.6 kg (162 lb 4.1 oz)   03/16/17 71.4 kg (157 lb 6.5 oz)   02/12/17 70.4 kg (155 lb 3.3 oz)       Physical Exam:  Constitutional: No acute distress, well appearing and well nourished. Appears alert, appears stated age and cooperative. Mildly overweight.  Eyes: Pupils equal and reactive, extra ocular eye movements intact, left eye normal, right eye normal.  Head: Normocephalic, without obvious abnormality,  atraumatic.  Neck: Supple, symmetric, trachea midline, no masses. The thyroid appears normal, no thyromegaly. There is no jugular venous distention. JVP is elevated . There are no carotid bruits, normal upstroke of bilateral carotid arteries.   Respiratory: Lungs are clear to auscultation. No wheezing, rales or rhonchi.  Cardiovascular: Non-displaced PMI. regular rhythm, normal rate. S1, S2 are normal. P2 is of incrreased intensity consistent with pulmonary hypertension. There were no murmurs appreciated, no click, rub or gallops noted.  Lower Extremities: 4+ dorsalis pedis, posterior tibial, femoral and popliteal pulses bilaterally with no evidence  of any abdominal bruits. She had no cyanosis or clubbing. Mild bilateral lower extremity edema.   Gastrointestinal: Normal bowel sounds  Non-tender, no masses. No hepatomegaly or splenomegaly. No hepatojugular reflux.  Musculoskeletal: No joint tenderness, deformity or swelling.  Skin: Normal coloration and turgor, no rashes, no suspicious skin lesions noted.   Neurologic: No focal deficits, alert and oriented x3, normal strength and tone. Normal symmetric reflexes. Normal coordination and gait.  Genitourinary: Was not performed as patient deferred.   Lymphatic: No palpable lymphadenopathy.   Psychiatric: Appears in good mood, fluent in speech and cognition.     LABORATORY:  Basic Metabolic Profile    Lab Results   Component Value Date/Time    SODIUM 144 03/16/2017 04:40 PM    POTASSIUM 3.5 03/16/2017 04:40 PM    CHLORIDE 113 (H) 03/16/2017 04:40 PM    CO2 18 (L) 03/16/2017 04:40 PM    ANIONGAP 13 03/16/2017 04:40 PM    Lab Results   Component Value Date/Time    BUN 11 03/16/2017 04:40 PM    CREATININE 0.87 03/16/2017 04:40 PM    GLUCOSENF 109 03/16/2017 04:40 PM            PROCEDURES:    Echocardiogram:Echocardiogram:    Left Ventricle - Normal left ventricular size.There is hyperdynamic left ventricular syst   olic function.LV Ejection Fraction is 83 %.Concentric  remodeling.Left ventricular diastol   ic parameters were normal.   Resting Segmental Wall Motion Analysis - Total wall motion score is 1.00. There are no re   gional wall motion abnormalities.   Right Ventricle - Normal right ventricular size.Severely depressed right ventricular syst   olic function.RV systolic pressure is consistent with critical (near systemic) pulmonary    hypertension.   Tricuspid Valve - There is moderate tricuspid regurgitation.       Discussion:  Ms. Petra KubaMisanik is a 52 yo with CTEPH s/p thrombectomy from [facility] with residual disease and severe PAH with history of noncompliance with medications and follow-up. I reviewed her labs from last week and they were normal. Her BNP was elevated however. I will restart her lasix 20mg  daily and obtain a BMP in 2 weeks. Today, I have obtained a UDS. Patient states she was in the ER last night for chest pain and took norco. I would like to see her back in 1 month to see Dr. Despina Hiddenaccamo to decide if she should start adempas.       Assessment:  1. 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  2. Severe right ventricular dysfunction  3. Mildly hypervolemic on examination  4. WHO functional class II/III  5. Factor 5, hypercoagulable  6. Hx of noncompliance  7. Pain seeking behaviors   8. Hypoxia     Plan:   1. Restart lasix 20mg  daily  2. BMP in 2 weeks  3. UDS today  4. If you have any questions please contact Sheralyn BoatmanJudy Germani RN at [phone].  5. Return in 1 month with Caccamo    I plan on seeing the patient again in 1 months with repeat blood work prior to their next visit. Of course, I would be happy to see her sooner if there are issues that develop in the interim.     Thank you for allowing me to participate in the care of your patient.  If you have any questions please do not hesitate to call.     Sincerely,  Farrel GobbleJada Powell, APRN, FNP-C  Advanced Heart Failure  [facility]  I was not present during the patient visit but was available for  questions at the time of service.       Andrey Spearman Amrit Cress, DO [facility]  Director, Advanced Heart Failure

## 2017-03-31 NOTE — Patient Instructions (Signed)
1. Start back lasix 20mg  daily  2. Obtain UDS  3. Obtain BMP in 2 weeks  4. F/u with Dr. Despina Hiddenaccamo in 1 month    Farrel GobbleJada Laelle Bridgett, FNP-C  03/31/2017, 12:27

## 2017-04-02 LAB — BENZODIAZEPINES CONFIRMATION, URINE
7-NH-CLONAZEPAM-BY GC/MS: 2461 ng/mL
7-NH-FLUNITRAZEPAM-BY GC/MS: NEGATIVE ng/mL
ALPHA OH-ALPRAZOLAM-BY GC/MS: NEGATIVE ng/mL
ALPHA OH-TRIAZOLAM-BY GC/MS: NEGATIVE ng/mL
ALPHA OH-TRIAZOLAM-BY GC/MS: NEGATIVE ng/mL
INTERPRETATION: POSITIVE
OXAZEPAM-BY GC/MS: NEGATIVE ng/mL
TEMAZEPAM-BY GC/MS: NEGATIVE ng/mL

## 2017-04-04 LAB — OPIATE CONFIRMATION, URINE
CODEINE-BY LC-MS/MS: NEGATIVE ng/mL
DIHYDROCODEINE-BY LC-MS/MS: 3423 ng/mL
HYDROCODONE-BY LC-MS/MS: 15715 ng/mL
HYDROMORPHONE-BY LC-MS/MS: 1934 ng/mL
NALOXONE-BY LC-MS/MS: NEGATIVE ng/mL
NORHYDROCODONE-BY LC-MS/MS: 19757 ng/mL
NOROXYCODONE-BY LC-MS/MS: NEGATIVE ng/mL
NOROXYMORPHONE-BY LC-MS/MS: NEGATIVE ng/mL
OXYCODONE-BY LC-MS/MS: NEGATIVE ng/mL
OXYMORPHONE-BY LC-MS/MS: NEGATIVE ng/mL

## 2017-04-18 DIAGNOSIS — R0602 Shortness of breath: Secondary | ICD-10-CM

## 2017-04-18 DIAGNOSIS — I272 Pulmonary hypertension, unspecified: Secondary | ICD-10-CM

## 2017-04-29 ENCOUNTER — Encounter (HOSPITAL_COMMUNITY): Payer: Self-pay | Admitting: Cardiovascular Disease

## 2017-08-18 DIAGNOSIS — J189 Pneumonia, unspecified organism: Secondary | ICD-10-CM
